# Patient Record
Sex: Female | Born: 1960 | Race: White | Hispanic: No | Smoking: Former smoker
Health system: Southern US, Community
[De-identification: ages and names within clinical notes are randomized; demographics above are authoritative.]

## PROBLEM LIST (undated history)

## (undated) DIAGNOSIS — B029 Zoster without complications: Secondary | ICD-10-CM

## (undated) DIAGNOSIS — R011 Cardiac murmur, unspecified: Secondary | ICD-10-CM

## (undated) DIAGNOSIS — E222 Syndrome of inappropriate secretion of antidiuretic hormone: Secondary | ICD-10-CM

## (undated) DIAGNOSIS — K219 Gastro-esophageal reflux disease without esophagitis: Secondary | ICD-10-CM

## (undated) DIAGNOSIS — H269 Unspecified cataract: Secondary | ICD-10-CM

## (undated) DIAGNOSIS — R519 Headache, unspecified: Secondary | ICD-10-CM

## (undated) DIAGNOSIS — F419 Anxiety disorder, unspecified: Secondary | ICD-10-CM

## (undated) DIAGNOSIS — J449 Chronic obstructive pulmonary disease, unspecified: Secondary | ICD-10-CM

## (undated) DIAGNOSIS — J439 Emphysema, unspecified: Secondary | ICD-10-CM

## (undated) DIAGNOSIS — I5189 Other ill-defined heart diseases: Secondary | ICD-10-CM

## (undated) DIAGNOSIS — E785 Hyperlipidemia, unspecified: Secondary | ICD-10-CM

## (undated) DIAGNOSIS — R911 Solitary pulmonary nodule: Secondary | ICD-10-CM

## (undated) DIAGNOSIS — M1990 Unspecified osteoarthritis, unspecified site: Secondary | ICD-10-CM

## (undated) DIAGNOSIS — I351 Nonrheumatic aortic (valve) insufficiency: Secondary | ICD-10-CM

## (undated) DIAGNOSIS — I10 Essential (primary) hypertension: Secondary | ICD-10-CM

## (undated) DIAGNOSIS — I739 Peripheral vascular disease, unspecified: Secondary | ICD-10-CM

## (undated) DIAGNOSIS — I2510 Atherosclerotic heart disease of native coronary artery without angina pectoris: Secondary | ICD-10-CM

## (undated) DIAGNOSIS — S32000A Wedge compression fracture of unspecified lumbar vertebra, initial encounter for closed fracture: Secondary | ICD-10-CM

## (undated) DIAGNOSIS — F32A Depression, unspecified: Secondary | ICD-10-CM

## (undated) DIAGNOSIS — I779 Disorder of arteries and arterioles, unspecified: Secondary | ICD-10-CM

## (undated) DIAGNOSIS — Z7982 Long term (current) use of aspirin: Secondary | ICD-10-CM

## (undated) DIAGNOSIS — R0600 Dyspnea, unspecified: Secondary | ICD-10-CM

## (undated) DIAGNOSIS — K8010 Calculus of gallbladder with chronic cholecystitis without obstruction: Secondary | ICD-10-CM

## (undated) DIAGNOSIS — I350 Nonrheumatic aortic (valve) stenosis: Secondary | ICD-10-CM

## (undated) DIAGNOSIS — T7840XA Allergy, unspecified, initial encounter: Secondary | ICD-10-CM

## (undated) DIAGNOSIS — Z6835 Body mass index (BMI) 35.0-35.9, adult: Secondary | ICD-10-CM

## (undated) DIAGNOSIS — E871 Hypo-osmolality and hyponatremia: Secondary | ICD-10-CM

## (undated) DIAGNOSIS — J189 Pneumonia, unspecified organism: Secondary | ICD-10-CM

## (undated) DIAGNOSIS — B019 Varicella without complication: Secondary | ICD-10-CM

## (undated) HISTORY — DX: Unspecified cataract: H26.9

## (undated) HISTORY — DX: Varicella without complication: B01.9

## (undated) HISTORY — DX: Allergy, unspecified, initial encounter: T78.40XA

## (undated) HISTORY — DX: Emphysema, unspecified: J43.9

## (undated) HISTORY — PX: EYE SURGERY: SHX253

## (undated) HISTORY — PX: TUBAL LIGATION: SHX77

## (undated) HISTORY — DX: Cardiac murmur, unspecified: R01.1

---

## 1995-08-07 HISTORY — PX: TUBAL LIGATION: SHX77

## 1998-02-14 ENCOUNTER — Ambulatory Visit (HOSPITAL_COMMUNITY): Admission: RE | Admit: 1998-02-14 | Discharge: 1998-02-14 | Payer: Self-pay | Admitting: Obstetrics and Gynecology

## 1998-03-11 ENCOUNTER — Other Ambulatory Visit: Admission: RE | Admit: 1998-03-11 | Discharge: 1998-03-11 | Payer: Self-pay | Admitting: Obstetrics and Gynecology

## 1999-03-08 ENCOUNTER — Other Ambulatory Visit: Admission: RE | Admit: 1999-03-08 | Discharge: 1999-03-08 | Payer: Self-pay | Admitting: Obstetrics and Gynecology

## 1999-11-21 ENCOUNTER — Encounter: Payer: Self-pay | Admitting: Family Medicine

## 1999-11-21 ENCOUNTER — Ambulatory Visit (HOSPITAL_COMMUNITY): Admission: RE | Admit: 1999-11-21 | Discharge: 1999-11-21 | Payer: Self-pay | Admitting: Family Medicine

## 2000-09-03 ENCOUNTER — Other Ambulatory Visit: Admission: RE | Admit: 2000-09-03 | Discharge: 2000-09-03 | Payer: Self-pay | Admitting: Obstetrics and Gynecology

## 2000-09-12 ENCOUNTER — Encounter: Payer: Self-pay | Admitting: Obstetrics and Gynecology

## 2000-09-12 ENCOUNTER — Ambulatory Visit (HOSPITAL_COMMUNITY): Admission: RE | Admit: 2000-09-12 | Discharge: 2000-09-12 | Payer: Self-pay | Admitting: Obstetrics and Gynecology

## 2000-11-27 ENCOUNTER — Encounter: Payer: Self-pay | Admitting: Family Medicine

## 2000-11-27 ENCOUNTER — Ambulatory Visit (HOSPITAL_COMMUNITY): Admission: RE | Admit: 2000-11-27 | Discharge: 2000-11-27 | Payer: Self-pay | Admitting: Family Medicine

## 2002-01-13 ENCOUNTER — Other Ambulatory Visit: Admission: RE | Admit: 2002-01-13 | Discharge: 2002-01-13 | Payer: Self-pay | Admitting: Obstetrics and Gynecology

## 2004-03-30 ENCOUNTER — Other Ambulatory Visit: Admission: RE | Admit: 2004-03-30 | Discharge: 2004-03-30 | Payer: Self-pay | Admitting: Obstetrics and Gynecology

## 2012-03-29 ENCOUNTER — Encounter (HOSPITAL_COMMUNITY): Payer: Self-pay | Admitting: *Deleted

## 2012-03-29 ENCOUNTER — Inpatient Hospital Stay (HOSPITAL_COMMUNITY)
Admission: EM | Admit: 2012-03-29 | Discharge: 2012-03-31 | DRG: 419 | Disposition: A | Discharge status: Home / Self Care | Payer: Medicaid Other | Attending: General Surgery | Admitting: General Surgery

## 2012-03-29 ENCOUNTER — Emergency Department (HOSPITAL_COMMUNITY): Payer: Medicaid Other

## 2012-03-29 DIAGNOSIS — K8020 Calculus of gallbladder without cholecystitis without obstruction: Secondary | ICD-10-CM

## 2012-03-29 DIAGNOSIS — Z6835 Body mass index (BMI) 35.0-35.9, adult: Secondary | ICD-10-CM

## 2012-03-29 DIAGNOSIS — F17200 Nicotine dependence, unspecified, uncomplicated: Secondary | ICD-10-CM | POA: Diagnosis present

## 2012-03-29 DIAGNOSIS — K8010 Calculus of gallbladder with chronic cholecystitis without obstruction: Secondary | ICD-10-CM | POA: Diagnosis present

## 2012-03-29 DIAGNOSIS — E669 Obesity, unspecified: Secondary | ICD-10-CM | POA: Diagnosis present

## 2012-03-29 DIAGNOSIS — I10 Essential (primary) hypertension: Secondary | ICD-10-CM | POA: Diagnosis present

## 2012-03-29 DIAGNOSIS — Z79899 Other long term (current) drug therapy: Secondary | ICD-10-CM

## 2012-03-29 HISTORY — DX: Essential (primary) hypertension: I10

## 2012-03-29 HISTORY — DX: Calculus of gallbladder with chronic cholecystitis without obstruction: K80.10

## 2012-03-29 HISTORY — DX: Body mass index (BMI) 35.0-35.9, adult: Z68.35

## 2012-03-29 HISTORY — DX: Zoster without complications: B02.9

## 2012-03-29 LAB — LIPASE, BLOOD: Lipase: 30 U/L (ref 11–59)

## 2012-03-29 LAB — CBC WITH DIFFERENTIAL/PLATELET
Eosinophils Absolute: 0.4 10*3/uL (ref 0.0–0.7)
Eosinophils Relative: 3 % (ref 0–5)
Hemoglobin: 11 g/dL — ABNORMAL LOW (ref 12.0–15.0)
Lymphs Abs: 4.1 10*3/uL — ABNORMAL HIGH (ref 0.7–4.0)
MCH: 22.7 pg — ABNORMAL LOW (ref 26.0–34.0)
MCV: 73.6 fL — ABNORMAL LOW (ref 78.0–100.0)
Monocytes Absolute: 1 10*3/uL (ref 0.1–1.0)
Monocytes Relative: 7 % (ref 3–12)
RBC: 4.85 MIL/uL (ref 3.87–5.11)

## 2012-03-29 LAB — COMPREHENSIVE METABOLIC PANEL
Alkaline Phosphatase: 101 U/L (ref 39–117)
BUN: 13 mg/dL (ref 6–23)
Calcium: 9.8 mg/dL (ref 8.4–10.5)
Creatinine, Ser: 0.87 mg/dL (ref 0.50–1.10)
GFR calc Af Amer: 89 mL/min — ABNORMAL LOW (ref >90)
Glucose, Bld: 102 mg/dL — ABNORMAL HIGH (ref 70–99)
Total Protein: 7.8 g/dL (ref 6.0–8.3)

## 2012-03-29 LAB — URINALYSIS, ROUTINE W REFLEX MICROSCOPIC
Bilirubin Urine: NEGATIVE
Nitrite: NEGATIVE
Specific Gravity, Urine: 1.025 (ref 1.005–1.030)
Urobilinogen, UA: 0.2 mg/dL (ref 0.0–1.0)

## 2012-03-29 MED ORDER — SODIUM CHLORIDE 0.9 % IV BOLUS (SEPSIS)
1000.0000 mL | Freq: Once | INTRAVENOUS | Status: AC
  Administered 2012-03-29: 1000 mL via INTRAVENOUS

## 2012-03-29 MED ORDER — MORPHINE SULFATE 4 MG/ML IJ SOLN
4.0000 mg | Freq: Once | INTRAMUSCULAR | Status: AC
  Administered 2012-03-29: 4 mg via INTRAVENOUS
  Filled 2012-03-29: qty 1

## 2012-03-29 MED ORDER — ONDANSETRON HCL 4 MG/2ML IJ SOLN
4.0000 mg | Freq: Once | INTRAMUSCULAR | Status: AC
  Administered 2012-03-29: 4 mg via INTRAVENOUS
  Filled 2012-03-29: qty 2

## 2012-03-29 NOTE — ED Notes (Signed)
Pt states x 3 weeks ago she started experiencing abdominal pain. Pt was treated for gastritis at this time. Pt states she developed a shingles rash by the end of that week and she was treated for that at that time. Pt states the following week, she developed abdominal pain again that has progressively worsened. Shingles are now resolved.

## 2012-03-30 ENCOUNTER — Inpatient Hospital Stay (HOSPITAL_COMMUNITY): Payer: Medicaid Other | Admitting: Anesthesiology

## 2012-03-30 ENCOUNTER — Encounter (HOSPITAL_COMMUNITY): Admission: EM | Disposition: A | Discharge status: Home / Self Care | Payer: Self-pay | Source: Home / Self Care

## 2012-03-30 ENCOUNTER — Emergency Department (HOSPITAL_COMMUNITY): Payer: Medicaid Other

## 2012-03-30 ENCOUNTER — Inpatient Hospital Stay (HOSPITAL_COMMUNITY): Payer: Medicaid Other

## 2012-03-30 ENCOUNTER — Encounter (HOSPITAL_COMMUNITY): Payer: Self-pay | Admitting: Anesthesiology

## 2012-03-30 DIAGNOSIS — K8066 Calculus of gallbladder and bile duct with acute and chronic cholecystitis without obstruction: Secondary | ICD-10-CM

## 2012-03-30 HISTORY — PX: CHOLECYSTECTOMY: SHX55

## 2012-03-30 LAB — SURGICAL PCR SCREEN: Staphylococcus aureus: POSITIVE — AB

## 2012-03-30 SURGERY — LAPAROSCOPIC CHOLECYSTECTOMY WITH INTRAOPERATIVE CHOLANGIOGRAM
Anesthesia: General | Site: Abdomen | Wound class: Clean Contaminated

## 2012-03-30 MED ORDER — BUPIVACAINE-EPINEPHRINE 0.25% -1:200000 IJ SOLN
INTRAMUSCULAR | Status: DC | PRN
  Administered 2012-03-30: 17 mL

## 2012-03-30 MED ORDER — KCL IN DEXTROSE-NACL 20-5-0.9 MEQ/L-%-% IV SOLN
INTRAVENOUS | Status: AC
  Filled 2012-03-30: qty 1000

## 2012-03-30 MED ORDER — EPHEDRINE SULFATE 50 MG/ML IJ SOLN
INTRAMUSCULAR | Status: DC | PRN
  Administered 2012-03-30 (×2): 10 mg via INTRAVENOUS

## 2012-03-30 MED ORDER — PANTOPRAZOLE SODIUM 40 MG IV SOLR
40.0000 mg | Freq: Every day | INTRAVENOUS | Status: DC
  Administered 2012-03-30: 40 mg via INTRAVENOUS
  Filled 2012-03-30 (×2): qty 40

## 2012-03-30 MED ORDER — SODIUM CHLORIDE 0.9 % IV SOLN
1.0000 g | INTRAVENOUS | Status: AC
  Administered 2012-03-31: 1 g via INTRAVENOUS
  Filled 2012-03-30: qty 1

## 2012-03-30 MED ORDER — FENTANYL CITRATE 0.05 MG/ML IJ SOLN
INTRAMUSCULAR | Status: DC | PRN
  Administered 2012-03-30: 100 ug via INTRAVENOUS
  Administered 2012-03-30 (×3): 50 ug via INTRAVENOUS
  Administered 2012-03-30: 100 ug via INTRAVENOUS

## 2012-03-30 MED ORDER — MORPHINE SULFATE 4 MG/ML IJ SOLN
4.0000 mg | Freq: Once | INTRAMUSCULAR | Status: AC
  Administered 2012-03-30: 4 mg via INTRAVENOUS
  Filled 2012-03-30: qty 1

## 2012-03-30 MED ORDER — IOHEXOL 300 MG/ML  SOLN
100.0000 mL | Freq: Once | INTRAMUSCULAR | Status: AC | PRN
  Administered 2012-03-30: 100 mL via INTRAVENOUS

## 2012-03-30 MED ORDER — LIDOCAINE HCL (CARDIAC) 20 MG/ML IV SOLN
INTRAVENOUS | Status: DC | PRN
  Administered 2012-03-30: 30 mg via INTRAVENOUS

## 2012-03-30 MED ORDER — ACETAMINOPHEN 10 MG/ML IV SOLN
INTRAVENOUS | Status: AC
  Filled 2012-03-30: qty 100

## 2012-03-30 MED ORDER — ONDANSETRON HCL 4 MG/2ML IJ SOLN: 4.0000 mg | Freq: Four times a day (QID) | INTRAMUSCULAR | Status: DC | PRN

## 2012-03-30 MED ORDER — SODIUM CHLORIDE 0.9 % IV SOLN
1.0000 g | INTRAVENOUS | Status: DC
  Administered 2012-03-30: 1 g via INTRAVENOUS
  Filled 2012-03-30 (×2): qty 1

## 2012-03-30 MED ORDER — CHLORHEXIDINE GLUCONATE CLOTH 2 % EX PADS: 6.0000 | MEDICATED_PAD | Freq: Every day | CUTANEOUS | Status: DC

## 2012-03-30 MED ORDER — MIDAZOLAM HCL 5 MG/5ML IJ SOLN
INTRAMUSCULAR | Status: DC | PRN
  Administered 2012-03-30: 1 mg via INTRAVENOUS

## 2012-03-30 MED ORDER — SUCCINYLCHOLINE CHLORIDE 20 MG/ML IJ SOLN
INTRAMUSCULAR | Status: DC | PRN
  Administered 2012-03-30: 150 mg via INTRAVENOUS

## 2012-03-30 MED ORDER — FENTANYL CITRATE 0.05 MG/ML IJ SOLN
25.0000 ug | INTRAMUSCULAR | Status: DC | PRN
  Administered 2012-03-30 (×3): 50 ug via INTRAVENOUS

## 2012-03-30 MED ORDER — PROPOFOL 10 MG/ML IV EMUL
INTRAVENOUS | Status: DC | PRN
  Administered 2012-03-30: 200 mg via INTRAVENOUS

## 2012-03-30 MED ORDER — FENTANYL CITRATE 0.05 MG/ML IJ SOLN
INTRAMUSCULAR | Status: AC
  Filled 2012-03-30: qty 2

## 2012-03-30 MED ORDER — LACTATED RINGERS IV SOLN
INTRAVENOUS | Status: DC | PRN
  Administered 2012-03-30 (×2): via INTRAVENOUS

## 2012-03-30 MED ORDER — BUPIVACAINE-EPINEPHRINE PF 0.25-1:200000 % IJ SOLN
INTRAMUSCULAR | Status: AC
  Filled 2012-03-30: qty 30

## 2012-03-30 MED ORDER — OXYCODONE-ACETAMINOPHEN 5-325 MG PO TABS: 1.0000 | ORAL_TABLET | ORAL | Status: DC | PRN

## 2012-03-30 MED ORDER — LACTATED RINGERS IR SOLN
Status: DC | PRN
  Administered 2012-03-30: 1000 mL

## 2012-03-30 MED ORDER — ONDANSETRON HCL 4 MG/2ML IJ SOLN
INTRAMUSCULAR | Status: DC | PRN
  Administered 2012-03-30: 2 mg via INTRAVENOUS

## 2012-03-30 MED ORDER — SODIUM CHLORIDE 0.9 % IV BOLUS (SEPSIS)
1000.0000 mL | Freq: Once | INTRAVENOUS | Status: AC
  Administered 2012-03-30: 1000 mL via INTRAVENOUS

## 2012-03-30 MED ORDER — KCL IN DEXTROSE-NACL 20-5-0.9 MEQ/L-%-% IV SOLN
INTRAVENOUS | Status: DC
  Administered 2012-03-30 – 2012-03-31 (×2): via INTRAVENOUS
  Filled 2012-03-30 (×5): qty 1000

## 2012-03-30 MED ORDER — AMLODIPINE BESYLATE 10 MG PO TABS
10.0000 mg | ORAL_TABLET | Freq: Every day | ORAL | Status: DC
  Administered 2012-03-30 – 2012-03-31 (×2): 10 mg via ORAL
  Filled 2012-03-30 (×2): qty 1

## 2012-03-30 MED ORDER — IOHEXOL 300 MG/ML  SOLN
INTRAMUSCULAR | Status: DC | PRN
  Administered 2012-03-30: 14 mL via INTRAVENOUS

## 2012-03-30 MED ORDER — MORPHINE SULFATE 2 MG/ML IJ SOLN
2.0000 mg | INTRAMUSCULAR | Status: DC | PRN
  Administered 2012-03-30: 2 mg via INTRAVENOUS
  Administered 2012-03-30: 4 mg via INTRAVENOUS
  Administered 2012-03-30 (×4): 2 mg via INTRAVENOUS
  Filled 2012-03-30 (×4): qty 1
  Filled 2012-03-30 (×2): qty 2
  Filled 2012-03-30: qty 1

## 2012-03-30 MED ORDER — CHLORHEXIDINE GLUCONATE CLOTH 2 % EX PADS: 6.0000 | MEDICATED_PAD | Freq: Once | CUTANEOUS | Status: DC

## 2012-03-30 MED ORDER — PROMETHAZINE HCL 25 MG/ML IJ SOLN: 6.2500 mg | INTRAMUSCULAR | Status: DC | PRN

## 2012-03-30 MED ORDER — ONDANSETRON HCL 4 MG/2ML IJ SOLN
4.0000 mg | Freq: Once | INTRAMUSCULAR | Status: AC
  Administered 2012-03-30: 4 mg via INTRAVENOUS
  Filled 2012-03-30: qty 2

## 2012-03-30 MED ORDER — ACETAMINOPHEN 10 MG/ML IV SOLN
INTRAVENOUS | Status: DC | PRN
  Administered 2012-03-30: 1000 mg via INTRAVENOUS

## 2012-03-30 MED ORDER — IOHEXOL 300 MG/ML  SOLN
INTRAMUSCULAR | Status: AC
  Filled 2012-03-30: qty 1

## 2012-03-30 MED ORDER — LISINOPRIL 20 MG PO TABS
20.0000 mg | ORAL_TABLET | Freq: Every day | ORAL | Status: DC
  Administered 2012-03-30 – 2012-03-31 (×2): 20 mg via ORAL
  Filled 2012-03-30 (×2): qty 1

## 2012-03-30 MED ORDER — CISATRACURIUM BESYLATE (PF) 10 MG/5ML IV SOLN
INTRAVENOUS | Status: DC | PRN
  Administered 2012-03-30: 3 mg via INTRAVENOUS
  Administered 2012-03-30: 7 mg via INTRAVENOUS

## 2012-03-30 SURGICAL SUPPLY — 41 items
APPLIER CLIP ROT 10 11.4 M/L (STAPLE) ×2
BENZOIN TINCTURE PRP APPL 2/3 (GAUZE/BANDAGES/DRESSINGS) IMPLANT
CANISTER SUCTION 2500CC (MISCELLANEOUS) ×2 IMPLANT
CATH REDDICK CHOLANGI 4FR 50CM (CATHETERS) IMPLANT
CLIP APPLIE ROT 10 11.4 M/L (STAPLE) ×1 IMPLANT
CLOTH BEACON ORANGE TIMEOUT ST (SAFETY) ×2 IMPLANT
COVER MAYO STAND STRL (DRAPES) ×2 IMPLANT
DECANTER SPIKE VIAL GLASS SM (MISCELLANEOUS) ×2 IMPLANT
DERMABOND ADVANCED (GAUZE/BANDAGES/DRESSINGS) ×2
DERMABOND ADVANCED .7 DNX12 (GAUZE/BANDAGES/DRESSINGS) ×2 IMPLANT
DRAPE C-ARM 42X72 X-RAY (DRAPES) ×2 IMPLANT
DRAPE LAPAROSCOPIC ABDOMINAL (DRAPES) ×2 IMPLANT
ELECT REM PT RETURN 9FT ADLT (ELECTROSURGICAL) ×2
ELECTRODE REM PT RTRN 9FT ADLT (ELECTROSURGICAL) ×1 IMPLANT
GLOVE BIOGEL PI IND STRL 7.0 (GLOVE) ×1 IMPLANT
GLOVE BIOGEL PI INDICATOR 7.0 (GLOVE) ×1
GLOVE SS BIOGEL STRL SZ 7.5 (GLOVE) ×1 IMPLANT
GLOVE SUPERSENSE BIOGEL SZ 7.5 (GLOVE) ×1
GOWN STRL NON-REIN LRG LVL3 (GOWN DISPOSABLE) ×2 IMPLANT
GOWN STRL REIN XL XLG (GOWN DISPOSABLE) ×4 IMPLANT
HEMOSTAT SURGICEL 4X8 (HEMOSTASIS) IMPLANT
IV CATH 14GX2 1/4 (CATHETERS) IMPLANT
IV SET EXT 30 76VOL 4 MALE LL (IV SETS) IMPLANT
KIT BASIN OR (CUSTOM PROCEDURE TRAY) ×2 IMPLANT
NS IRRIG 1000ML POUR BTL (IV SOLUTION) ×2 IMPLANT
POUCH SPECIMEN RETRIEVAL 10MM (ENDOMECHANICALS) ×2 IMPLANT
SCISSORS LAP 5X35 DISP (ENDOMECHANICALS) ×2 IMPLANT
SET CHOLANGIOGRAPH MIX (MISCELLANEOUS) ×2 IMPLANT
SET IRRIG TUBING LAPAROSCOPIC (IRRIGATION / IRRIGATOR) ×2 IMPLANT
SLEEVE Z-THREAD 5X100MM (TROCAR) ×2 IMPLANT
SOLUTION ANTI FOG 6CC (MISCELLANEOUS) ×2 IMPLANT
STOPCOCK K 69 2C6206 (IV SETS) IMPLANT
STRIP CLOSURE SKIN 1/2X4 (GAUZE/BANDAGES/DRESSINGS) IMPLANT
SUT MNCRL AB 4-0 PS2 18 (SUTURE) ×4 IMPLANT
TOWEL OR 17X26 10 PK STRL BLUE (TOWEL DISPOSABLE) ×2 IMPLANT
TOWEL OR NON WOVEN STRL DISP B (DISPOSABLE) ×2 IMPLANT
TRAY LAP CHOLE (CUSTOM PROCEDURE TRAY) ×2 IMPLANT
TROCAR HASSON GELL 12X100 (TROCAR) ×2 IMPLANT
TROCAR Z-THREAD FIOS 11X100 BL (TROCAR) ×2 IMPLANT
TROCAR Z-THREAD FIOS 5X100MM (TROCAR) ×2 IMPLANT
TUBING INSUFFLATION 10FT LAP (TUBING) ×2 IMPLANT

## 2012-03-30 NOTE — ED Provider Notes (Signed)
History     CSN: [id number]  Arrival date & time 03/29/12  1942   First MD Initiated Contact with Patient 03/29/12 2203      Chief Complaint  Patient presents with  . Abdominal Pain    (Consider location/radiation/quality/duration/timing/severity/associated sxs/prior treatment) HPI  Patient presents to the emergency department with complaints of abdominal pain. She was seen by her primary care doctor and was diagnosed with gastritis and shingles she was treated for that and the shingles have resolved. She states that she still continues to have abdominal pain that has been progressively getting worse. She's come to the hospital tonight because she just can't take it anymore and she's concerned about what's going on with her. She states that she has not had an appetite and has not been in a couple of days. The patient denies the pain being worse after eating. She states that she does not have her gallbladder, and has never been told she has problems with her liver or her pancreas. The patient is in no acute distress at this time however she does appear to be very uncomfortable. She denies vomiting and diarrhea but does admit to having nausea.  Past Medical History  Diagnosis Date  . Shingles   . Hypertension     Past Surgical History  Procedure Date  . Tubal ligation     History reviewed. No pertinent family history.  History  Substance Use Topics  . Smoking status: Current Everyday Smoker    Types: Cigarettes  . Smokeless tobacco: Not on file  . Alcohol Use: No    OB History    Grav Para Term Preterm Abortions TAB SAB Ect Mult Living                  Review of Systems  All other systems reviewed and are negative.    Allergies  Codeine  Home Medications   Current Outpatient Rx  Name Route Sig Dispense Refill  . ACETAMINOPHEN 325 MG PO TABS Oral Take 650 mg by mouth every 6 (six) hours as needed. Pain    . AMLODIPINE BESYLATE 10 MG PO TABS Oral Take 10 mg by  mouth daily.    . BUPROPION HCL ER (XL) 300 MG PO TB24 Oral Take 300 mg by mouth daily.    Marland Kitchen LISINOPRIL 20 MG PO TABS Oral Take 20 mg by mouth daily.    Marland Kitchen RANITIDINE HCL 150 MG PO TABS Oral Take 150 mg by mouth 2 (two) times daily.    Marland Kitchen SIMETHICONE 80 MG PO CHEW Oral Chew 80 mg by mouth every 6 (six) hours as needed. Flatulence      BP 155/91  Pulse 64  Temp 99.3 F (37.4 C) (Oral)  Resp 18  Ht 5\' 3"  (1.6 m)  Wt 206 lb (93.441 kg)  BMI 36.49 kg/m2  SpO2 99%  Physical Exam  Nursing note and vitals reviewed. Constitutional: She appears well-developed and well-nourished. No distress.  HENT:  Head: Normocephalic and atraumatic.  Eyes: Pupils are equal, round, and reactive to light.  Neck: Normal range of motion. Neck supple.  Cardiovascular: Normal rate and regular rhythm.   Pulmonary/Chest: Effort normal.  Abdominal: Soft. Bowel sounds are normal. She exhibits no distension and no mass. There is tenderness (RUQ). There is no rebound and no guarding.  Neurological: She is alert.  Skin: Skin is warm and dry.    ED Course  Procedures (including critical care time)  Labs Reviewed  CBC WITH DIFFERENTIAL -  Abnormal; Notable for the following:    WBC 14.1 (*)     Hemoglobin 11.0 (*)     HCT 35.7 (*)     MCV 73.6 (*)     MCH 22.7 (*)     RDW 16.6 (*)     Platelets 592 (*)     Neutro Abs 8.4 (*)     Lymphs Abs 4.1 (*)     All other components within normal limits  COMPREHENSIVE METABOLIC PANEL - Abnormal; Notable for the following:    Glucose, Bld 102 (*)     Total Bilirubin 0.1 (*)     GFR calc non Af Amer 76 (*)     GFR calc Af Amer 89 (*)     All other components within normal limits  LIPASE, BLOOD  URINALYSIS, ROUTINE W REFLEX MICROSCOPIC   Ct Abdomen Pelvis W Contrast  03/30/2012  *RADIOLOGY REPORT*  Clinical Data: Abdominal pain  CT ABDOMEN AND PELVIS WITH CONTRAST  Technique:  Multidetector CT imaging of the abdomen and pelvis was performed following the standard  protocol during bolus administration of intravenous contrast.  Contrast: OMNIPAQUE IOHEXOL 300 MG/ML  SOLN  Comparison: None.  Findings: Aortic valve calcification.  Coronary artery calcification.  The lung bases are clear.  Homogeneous hepatic enhancement.  Nonspecific hypodensity within the spleen. Gallstones.  No pericholecystic fluid or gallbladder wall thickening.  Minimal intra and extrahepatic biliary ductal prominence to the level of the ampulla.  No pancreatic ductal dilatation 6 mm hypodensity within the uncinate process.  Otherwise homogeneous pancreatic enhancement.  Unremarkable adrenal glands.  Subcentimeter hypodensity left kidney.  Otherwise symmetric renal enhancement.  No hydronephrosis or hydroureter.  The no bowel obstruction.  Normal appendix.  No CT evidence for colitis.  No free intraperitoneal air or fluid.  No lymphadenopathy.  Thin-walled partially decompressed bladder.  Unremarkable CT appearance to the uterus and adnexa.  There is scattered atherosclerotic calcification of the aorta and its branches. No aneurysmal dilatation.  Mild multilevel degenerative changes.  No acute osseous finding.  IMPRESSION: Cholelithiasis.  No CT evidence for cholecystitis.  Mild intra and extrahepatic biliary ductal prominence to the level of the ampulla.  No obstructing lesion identified.  Correlate with LFTs and ERCP or MRCP if clinically warranted.   Original Report Authenticated By: Waneta Martins, M.D.      1. Cholelithiasis       MDM   CT of the abdomen done originally because patient and myself did not think she still has her gallbladder. CT scan shows that she does indeed have a gallbladder and that she has gallstones. I have consulted with surgery as the patient's pain has been difficult to control in the emergency department, and she has a white count of 14, the general surgeon has agreed to come to the emergency department and admit the patient.  Patient will be admitted to  Gen. surgery. I have discussed the results of the plan with the patient who is agreeable and understandable.       Dorthula Matas, PA 03/30/12 [phone]

## 2012-03-30 NOTE — Op Note (Signed)
Preoperative diagnosis: Cholelithiasis and cholecystitis  Postoperative diagnosis: Cholelithiasis and cholecystitis  Surgical procedure: Laparoscopic cholecystectomy with intraoperative cholangiogram  Surgeon: Sharlet Salina T. Donye Campanelli M.D.  Assistant: Lodema Pilot D.O.  Anesthesia: General Endotracheal  Complications: None  Estimated blood loss: Minimal  Description of procedure: The patient brought to the operating room, placed in the supine position on the operating table, and general endotracheal anesthesia induced. The abdomen was widely sterilely prepped and draped. The patient had received preoperative IV antibiotics and PAS were in place. Patient timeout was performed the correct procedure verified. Standard 4 port technique was used with an open Hassan cannula at the umbilicus and the remainder of the ports placed under direct vision. The gallbladder was visualized. It appeared tense and acutely inflamed. The GB was decompressed with an aspiration needle . The fundus was grasped and elevated up over the liver and the infundibulum retracted inferiolaterally. Peritoneum anterior and posterior to close triangle was incised and fibrofatty tissue stripped off the neck of the gallbladder toward the porta hepatis. The distal gallbladder was thoroughly dissected. The cystic artery was identified in close triangle and the cystic duct gallbladder junction dissected 360.  A good critical view was obtained. When the anatomy was clear the cystic duct was clipped at the gallbladder junction and an operative cholangiogram obtained through the cystic duct. This showed good filling of a normal common bile duct and intrahepatic ducts with free flow into the duodenum and no filling defects. Following this the Cholangiocath was removed and the cystic duct was doubly clipped proximally and divided. The cystic artery was doubly clipped proximally and distally and divided. The gallbladder was dissected free from its  bed using hook cautery and removed through the umbilical port site. Complete hemostasis was obtained in the gallbladder bed. The right upper quadrant was thoroughly irrigated and hemostasis assured. Trochars were removed and all CO2 evacuated and the [facility] trocar site fascial defect closed. Skin incisions were closed with subcuticular Monocryl and Dermabond. Sponge needle and instrument counts were correct. The patient was taken to PACU in good condition.  Suhailah Kwan T  03/30/2012

## 2012-03-30 NOTE — ED Notes (Signed)
Ct notified pt completed CM 

## 2012-03-30 NOTE — H&P (Signed)
Denise [name] is an 51 y.o. female.   Chief Complaint: abdominal pain HPI: patient presents to the emergency room with persistent abdominal pain. She states that she has had similar pain in her epigastrium and right upper quadrant some months ago that resolved. Then starting 5 days ago she has been having waxing and waning pain in her epigastrium and right upper quadrant radiating to her flank. This occasionally gets very severe. It has been daily and became quite bad today and she presented to the emergency room. She has received several doses of pain medication in the emergency room and the pain will then recur. She has had no nausea or vomiting. She is felt chilled but no fevers. No jaundice. She's been somewhat constipated during this episode. The pain is somewhat positional.  Past Medical History  Diagnosis Date  . Shingles   . Hypertension     Past Surgical History  Procedure Date  . Tubal ligation     History reviewed. No pertinent family history. Social History:  reports that she has been smoking Cigarettes.  She does not have any smokeless tobacco history on file. She reports that she does not drink alcohol or use illicit drugs.  Allergies:  Allergies  Allergen Reactions  . Codeine Itching                                                                              Current Outpatient Prescriptions  Medication Sig Dispense Refill  . acetaminophen (TYLENOL) 325 MG tablet Take 650 mg by mouth every 6 (six) hours as needed. Pain      . amLODipine (NORVASC) 10 MG tablet Take 10 mg by mouth daily.      Marland Kitchen buPROPion (WELLBUTRIN XL) 300 MG 24 hr tablet Take 300 mg by mouth daily.      Marland Kitchen lisinopril (PRINIVIL,ZESTRIL) 20 MG tablet Take 20 mg by mouth daily.      . ranitidine (ZANTAC) 150 MG tablet Take 150 mg by mouth 2 (two) times daily.      . simethicone (GAS-X) 80 MG chewable tablet Chew 80 mg by mouth every 6 (six) hours as needed. Flatulence         Results for  orders placed during the hospital encounter of 03/29/12 (from the past 48 hour(s))  URINALYSIS, ROUTINE W REFLEX MICROSCOPIC     Status: Normal   Collection Time   03/29/12  8:03 PM      Component Value Range Comment   Color, Urine YELLOW  YELLOW    APPearance CLEAR  CLEAR    Specific Gravity, Urine 1.025  1.005 - 1.030    pH 5.5  5.0 - 8.0    Glucose, UA NEGATIVE  NEGATIVE mg/dL    Hgb urine dipstick NEGATIVE  NEGATIVE    Bilirubin Urine NEGATIVE  NEGATIVE    Ketones, ur NEGATIVE  NEGATIVE mg/dL    Protein, ur NEGATIVE  NEGATIVE mg/dL    Urobilinogen, UA 0.2  0.0 - 1.0 mg/dL    Nitrite NEGATIVE  NEGATIVE    Leukocytes, UA NEGATIVE  NEGATIVE MICROSCOPIC NOT DONE ON URINES WITH NEGATIVE PROTEIN, BLOOD, LEUKOCYTES, NITRITE, OR GLUCOSE <1000 mg/dL.  CBC WITH DIFFERENTIAL  Status: Abnormal   Collection Time   03/29/12  8:47 PM      Component Value Range Comment   WBC 14.1 (*) 4.0 - 10.5 K/uL    RBC 4.85  3.87 - 5.11 MIL/uL    Hemoglobin 11.0 (*) 12.0 - 15.0 g/dL    HCT 40.9 (*) 81.1 - 46.0 %    MCV 73.6 (*) 78.0 - 100.0 fL    MCH 22.7 (*) 26.0 - 34.0 pg    MCHC 30.8  30.0 - 36.0 g/dL    RDW 91.4 (*) 78.2 - 15.5 %    Platelets 592 (*) 150 - 400 K/uL    Neutrophils Relative 60  43 - 77 %    Neutro Abs 8.4 (*) 1.7 - 7.7 K/uL    Lymphocytes Relative 29  12 - 46 %    Lymphs Abs 4.1 (*) 0.7 - 4.0 K/uL    Monocytes Relative 7  3 - 12 %    Monocytes Absolute 1.0  0.1 - 1.0 K/uL    Eosinophils Relative 3  0 - 5 %    Eosinophils Absolute 0.4  0.0 - 0.7 K/uL    Basophils Relative 1  0 - 1 %    Basophils Absolute 0.1  0.0 - 0.1 K/uL   COMPREHENSIVE METABOLIC PANEL     Status: Abnormal   Collection Time   03/29/12  8:47 PM      Component Value Range Comment   Sodium 137  135 - 145 mEq/L    Potassium 4.4  3.5 - 5.1 mEq/L    Chloride 101  96 - 112 mEq/L    CO2 26  19 - 32 mEq/L    Glucose, Bld 102 (*) 70 - 99 mg/dL    BUN 13  6 - 23 mg/dL    Creatinine, Ser 9.56  0.50 - 1.10 mg/dL     Calcium 9.8  8.4 - 10.5 mg/dL    Total Protein 7.8  6.0 - 8.3 g/dL    Albumin 3.8  3.5 - 5.2 g/dL    AST 14  0 - 37 U/L    ALT 16  0 - 35 U/L    Alkaline Phosphatase 101  39 - 117 U/L    Total Bilirubin 0.1 (*) 0.3 - 1.2 mg/dL    GFR calc non Af Amer 76 (*) >90 mL/min    GFR calc Af Amer 89 (*) >90 mL/min   LIPASE, BLOOD     Status: Normal   Collection Time   03/29/12  8:47 PM      Component Value Range Comment   Lipase 30  11 - 59 U/L    Ct Abdomen Pelvis W Contrast  03/30/2012  *RADIOLOGY REPORT*  Clinical Data: Abdominal pain  CT ABDOMEN AND PELVIS WITH CONTRAST  Technique:  Multidetector CT imaging of the abdomen and pelvis was performed following the standard protocol during bolus administration of intravenous contrast.  Contrast: OMNIPAQUE IOHEXOL 300 MG/ML  SOLN  Comparison: None.  Findings: Aortic valve calcification.  Coronary artery calcification.  The lung bases are clear.  Homogeneous hepatic enhancement.  Nonspecific hypodensity within the spleen. Gallstones.  No pericholecystic fluid or gallbladder wall thickening.  Minimal intra and extrahepatic biliary ductal prominence to the level of the ampulla.  No pancreatic ductal dilatation 6 mm hypodensity within the uncinate process.  Otherwise homogeneous pancreatic enhancement.  Unremarkable adrenal glands.  Subcentimeter hypodensity left kidney.  Otherwise symmetric renal enhancement.  No hydronephrosis or hydroureter.  The no bowel obstruction.  Normal appendix.  No CT evidence for colitis.  No free intraperitoneal air or fluid.  No lymphadenopathy.  Thin-walled partially decompressed bladder.  Unremarkable CT appearance to the uterus and adnexa.  There is scattered atherosclerotic calcification of the aorta and its branches. No aneurysmal dilatation.  Mild multilevel degenerative changes.  No acute osseous finding.  IMPRESSION: Cholelithiasis.  No CT evidence for cholecystitis.  Mild intra and extrahepatic biliary ductal prominence  to the level of the ampulla.  No obstructing lesion identified.  Correlate with LFTs and ERCP or MRCP if clinically warranted.   Original Report Authenticated By: Waneta Martins, M.D.     Review of Systems  Constitutional: Positive for chills. Negative for fever.  HENT: Negative.   Eyes: Negative.   Respiratory: Negative.   Cardiovascular: Negative.   Gastrointestinal: Positive for abdominal pain and constipation. Negative for nausea, vomiting, diarrhea, blood in stool and melena.  Genitourinary: Negative.   Musculoskeletal: Negative.     Blood pressure 155/91, pulse 64, temperature 99.3 F (37.4 C), temperature source Oral, resp. rate 18, height 5\' 3"  (1.6 m), weight 206 lb (93.441 kg), SpO2 99.00%. Physical Exam  General: Moderately obese Caucasian female in no acute distress Skin: Warm and dry without rash or infection HEENT: Sclera nonicteric. Pupils equal reactive. No palpable masses or thyromegaly. Oropharynx clear. Lymph nodes: No cervical, supraclavicular, or axillary nodes palpable Lungs: Clear equal breath sounds bilaterally without wheezing or increased work of breathing Cardiovascular: Regular rate and rhythm. No murmur. No peripheral edema. Pulses intact. Abdomen: Healed small umbilical incision. No hernias. Nondistended. There is mild right upper quadrant tenderness without guarding. No masses or organomegaly. Extremities: No joint swelling or deformity Neurologic: She is alert and fully oriented. Affect normal. No motor deficits.  Assessment/Plan Persistent and worsening right upper quadrant abdominal pain consistent with persistent biliary colic or early cholecystitis. She has multiple gallstones on CT. White count is somewhat elevated. She will be admitted and treated with pain medication. We'll cover with IV antibiotics for possible cholecystitis. Plan urgent laparoscopic cholecystectomy perI discussed the procedure in detail.    We discussed the risks and benefits  of a laparoscopic cholecystectomy and possible cholangiogram including, but not limited to bleeding, infection, injury to surrounding structures such as the intestine or liver, bile leak, retained gallstones, need to convert to an open procedure, prolonged diarrhea, blood clots such as  DVT, common bile duct injury, anesthesia risks, and possible need for additional procedures.  The likelihood of improvement in symptoms and return to the patient's normal status is good. We discussed the typical post-operative recovery course.   Mahlani Berninger T 03/30/2012, 3:23 AM

## 2012-03-30 NOTE — Anesthesia Preprocedure Evaluation (Addendum)
Anesthesia Evaluation  Patient identified by MRN, date of birth, ID band Patient awake    Reviewed: Allergy & Precautions, H&P , NPO status , Patient's Chart, lab work & pertinent test results  Airway Mallampati: II TM Distance: >3 FB Neck ROM: Full    Dental No notable dental hx.    Pulmonary Current Smoker,  breath sounds clear to auscultation  Pulmonary exam normal       Cardiovascular hypertension, Pt. on medications Rhythm:Regular Rate:Normal     Neuro/Psych negative neurological ROS  negative psych ROS   GI/Hepatic negative GI ROS, Neg liver ROS,   Endo/Other  negative endocrine ROS  Renal/GU negative Renal ROS  negative genitourinary   Musculoskeletal negative musculoskeletal ROS (+)   Abdominal (+) + obese,   Peds negative pediatric ROS (+)  Hematology negative hematology ROS (+)   Anesthesia Other Findings   Reproductive/Obstetrics negative OB ROS                          Anesthesia Physical Anesthesia Plan  ASA: II  Anesthesia Plan: General   Post-op Pain Management:    Induction: Intravenous  Airway Management Planned: Oral ETT  Additional Equipment:   Intra-op Plan:   Post-operative Plan: Extubation in OR  Informed Consent: I have reviewed the patients History and Physical, chart, labs and discussed the procedure including the risks, benefits and alternatives for the proposed anesthesia with the patient or authorized representative who has indicated his/her understanding and acceptance.   Dental advisory given  Plan Discussed with: CRNA  Anesthesia Plan Comments:         Anesthesia Quick Evaluation

## 2012-03-30 NOTE — Transfer of Care (Signed)
Immediate Anesthesia Transfer of Care Note  Patient: Denise [name]  Procedure(s) Performed: Procedure(s) (LRB): LAPAROSCOPIC CHOLECYSTECTOMY WITH INTRAOPERATIVE CHOLANGIOGRAM (N/A)  Patient Location: PACU  Anesthesia Type: General  Level of Consciousness: awake, alert , oriented and patient cooperative  Airway & Oxygen Therapy: Patient Spontanous Breathing and Patient connected to face mask oxygen  Post-op Assessment: Report given to PACU RN  Post vital signs: Reviewed and stable  Complications: No apparent anesthesia complications

## 2012-03-30 NOTE — Anesthesia Postprocedure Evaluation (Signed)
  Anesthesia Post-op Note  Patient: Denise [name]  Procedure(s) Performed: Procedure(s) (LRB): LAPAROSCOPIC CHOLECYSTECTOMY WITH INTRAOPERATIVE CHOLANGIOGRAM (N/A)  Patient Location: PACU  Anesthesia Type: General  Level of Consciousness: awake and alert   Airway and Oxygen Therapy: Patient Spontanous Breathing  Post-op Pain: mild  Post-op Assessment: Post-op Vital signs reviewed, Patient's Cardiovascular Status Stable, Respiratory Function Stable, Patent Airway and No signs of Nausea or vomiting  Post-op Vital Signs: stable  Complications: No apparent anesthesia complications

## 2012-03-31 ENCOUNTER — Encounter (HOSPITAL_COMMUNITY): Payer: Self-pay | Admitting: General Surgery

## 2012-03-31 DIAGNOSIS — I10 Essential (primary) hypertension: Secondary | ICD-10-CM | POA: Diagnosis present

## 2012-03-31 DIAGNOSIS — Z6835 Body mass index (BMI) 35.0-35.9, adult: Secondary | ICD-10-CM

## 2012-03-31 DIAGNOSIS — K8010 Calculus of gallbladder with chronic cholecystitis without obstruction: Secondary | ICD-10-CM

## 2012-03-31 HISTORY — DX: Calculus of gallbladder with chronic cholecystitis without obstruction: K80.10

## 2012-03-31 HISTORY — DX: Essential (primary) hypertension: I10

## 2012-03-31 HISTORY — DX: Body mass index (BMI) 35.0-35.9, adult: Z68.35

## 2012-03-31 MED ORDER — OXYCODONE-ACETAMINOPHEN 5-325 MG PO TABS: 1.0000 | ORAL_TABLET | ORAL | Status: AC | PRN

## 2012-03-31 NOTE — Progress Notes (Signed)
Patient to be discharged, awaiting ride.  Discharge instructions and teaching given to patient with verbal feedback and understanding.

## 2012-03-31 NOTE — Discharge Summary (Signed)
Physician Discharge Summary  Patient ID: Denise [name] MRN: [id number] DOB/AGE: May 02, 1961 51 y.o.  Admit date: 03/29/2012 Discharge date: 03/31/2012  Admission Diagnoses: Cholelithiasis and cholecystitis  Hypertension  Hx. Shingles    Discharge Diagnoses: Same Principal Problem:  *Cholecystitis with cholelithiasis Active Problems:  Hypertension  BMI 35.0-35.9,adult   PROCEDURES: Laparoscopic cholecystectomy with intraoperative cholangiogram. 03/30/12 Dr. [facility] Course: patient presents to the emergency room with persistent abdominal pain. She states that she has had similar pain in her epigastrium and right upper quadrant some months ago that resolved. Then starting 5 days ago she has been having waxing and waning pain in her epigastrium and right upper quadrant radiating to her flank. This occasionally gets very severe. It has been daily and became quite bad today and she presented to the emergency room. She has received several doses of pain medication in the emergency room and the pain will then recur. She has had no nausea or vomiting. She is felt chilled but no fevers. No jaundice. She's been somewhat constipated during this episode. The pain is somewhat positional. Pt tolerated the procedure well her diet has been advanced, she is ambulating well and wants to go home.  Follow up in 2-3 weeks Dr. Johna Sheriff  Condition on D/C:  Improved.   Disposition: Final discharge disposition not confirmed   Medication List  As of 03/31/2012  2:49 PM   TAKE these medications         acetaminophen 325 MG tablet   Commonly known as: TYLENOL   Take 650 mg by mouth every 6 (six) hours as needed. Pain      amLODipine 10 MG tablet   Commonly known as: NORVASC   Take 10 mg by mouth daily.      buPROPion 300 MG 24 hr tablet   Commonly known as: WELLBUTRIN XL   Take 300 mg by mouth daily.      GAS-X 80 MG chewable tablet   Generic drug: simethicone   Chew 80 mg by mouth every  6 (six) hours as needed. Flatulence      lisinopril 20 MG tablet   Commonly known as: PRINIVIL,ZESTRIL   Take 20 mg by mouth daily.      oxyCODONE-acetaminophen 5-325 MG per tablet   Commonly known as: PERCOCET/ROXICET   Take 1-2 tablets by mouth every 4 (four) hours as needed.      ranitidine 150 MG tablet   Commonly known as: ZANTAC   Take 150 mg by mouth 2 (two) times daily.           Follow-up Information    Follow up with HOXWORTH,BENJAMIN T, MD. Schedule an appointment as soon as possible for a visit in 2 weeks. (Make an appointment in 2-3 weeks, call for follow up sooner if you  have a problem.)    Contact information:   [address] [place] [address] [phone]       Please follow up. (You can return to work on 04/08/12.  No lifting over 20 pounds for 4 weeks.)          Signed: Sherrie George 03/31/2012, 2:49 PM

## 2012-03-31 NOTE — Progress Notes (Signed)
1 Day Post-Op  Subjective: Awake alert and sitting up in chair.  States that she feels much better, + Flatus, no BM, bloating, no nausea.  Objective: Vital signs in last 24 hours: Temp:  [97.5 F (36.4 C)-98.7 F (37.1 C)] 97.6 F (36.4 C) (08/26 0600) Pulse Rate:  [58-111] 76  (08/26 1100) Resp:  [12-18] 18  (08/26 1100) BP: (117-149)/(66-84) 149/84 mmHg (08/26 1100) SpO2:  [86 %-100 %] 91 % (08/26 1100) Last BM Date: 03/29/12  Intake/Output from previous day: 08/25 0701 - 08/26 0700 In: 2554.3 [I.V.:2504.3; IV Piggyback:50] Out: 1210 [Urine:1200; Blood:10] Intake/Output this shift: Total I/O In: 600 [P.O.:600] Out: -   General appearance: alert, cooperative, appears stated age and no distress Chest: CTA bilaterally Cardiac: RRR, no M/R/G Abdomen: soft, appropriately tender, all surgical wound sites are well approximated and without erythema or drainage. No ecchymosis. +flatus, no bm, no c/o pain, n/v. VSS, afebrile.  Lab Results:   [facility] 03/29/12 2047  WBC 14.1*  HGB 11.0*  HCT 35.7*  PLT 592*   BMET  Basename 03/29/12 2047  NA 137  K 4.4  CL 101  CO2 26  GLUCOSE 102*  BUN 13  CREATININE 0.87  CALCIUM 9.8   PT/INR No results found for this basename: LABPROT:2,INR:2 in the last 72 hours ABG No results found for this basename: PHART:2,PCO2:2,PO2:2,HCO3:2 in the last 72 hours  Studies/Results: Dg Chest 2 View  03/30/2012  *RADIOLOGY REPORT*  Clinical Data: Preoperative respiratory examination for cholecystectomy.  Cough.  CHEST - 2 VIEW  Comparison: 12/09/2007 and prior chest radiographs.  Findings: The cardiomediastinal silhouette is unremarkable. Mild peribronchial thickening again identified. There is no evidence of focal airspace disease, pulmonary edema, suspicious pulmonary nodule/mass, pleural effusion, or pneumothorax. No acute bony abnormalities are identified.  IMPRESSION: No evidence of acute cardiopulmonary disease.   Original Report  Authenticated By: Rosendo Gros, M.D.    Dg Cholangiogram Operative  03/30/2012  *RADIOLOGY REPORT*  Clinical Data:   Cholecystectomy.  INTRAOPERATIVE CHOLANGIOGRAM  Technique:  Cholangiographic images from the C-arm fluoroscopic device were submitted for interpretation post-operatively.  Please see the procedural report for the amount of contrast and the fluoroscopy time utilized.  Comparison:  CT abdomen pelvis 03/30/2012.  Findings:  We are provided with a series of fluoroscopic intraoperative spot views from cholangiogram.  Surgical clips are noted.  The cystic duct is cannulated.  The common bile duct appears prominent but no filling defect is identified.  Contrast material flows into the duodenum.  There is no leakage of contrast.  IMPRESSION: Normal intraoperative cholangiogram.   Original Report Authenticated By: Bernadene Bell. Maricela Curet, M.D.    Ct Abdomen Pelvis W Contrast  03/30/2012  *RADIOLOGY REPORT*  Clinical Data: Abdominal pain  CT ABDOMEN AND PELVIS WITH CONTRAST  Technique:  Multidetector CT imaging of the abdomen and pelvis was performed following the standard protocol during bolus administration of intravenous contrast.  Contrast: OMNIPAQUE IOHEXOL 300 MG/ML  SOLN  Comparison: None.  Findings: Aortic valve calcification.  Coronary artery calcification.  The lung bases are clear.  Homogeneous hepatic enhancement.  Nonspecific hypodensity within the spleen. Gallstones.  No pericholecystic fluid or gallbladder wall thickening.  Minimal intra and extrahepatic biliary ductal prominence to the level of the ampulla.  No pancreatic ductal dilatation 6 mm hypodensity within the uncinate process.  Otherwise homogeneous pancreatic enhancement.  Unremarkable adrenal glands.  Subcentimeter hypodensity left kidney.  Otherwise symmetric renal enhancement.  No hydronephrosis or hydroureter.  The no bowel obstruction.  Normal appendix.  No CT evidence for colitis.  No free intraperitoneal air or fluid.   No lymphadenopathy.  Thin-walled partially decompressed bladder.  Unremarkable CT appearance to the uterus and adnexa.  There is scattered atherosclerotic calcification of the aorta and its branches. No aneurysmal dilatation.  Mild multilevel degenerative changes.  No acute osseous finding.  IMPRESSION: Cholelithiasis.  No CT evidence for cholecystitis.  Mild intra and extrahepatic biliary ductal prominence to the level of the ampulla.  No obstructing lesion identified.  Correlate with LFTs and ERCP or MRCP if clinically warranted.   Original Report Authenticated By: Waneta Martins, M.D.     Anti-infectives: Anti-infectives     Start     Dose/Rate Route Frequency Ordered Stop   03/31/12 0530   ertapenem (INVANZ) 1 g in sodium chloride 0.9 % 50 mL IVPB        1 g 100 mL/hr over 30 Minutes Intravenous Every 24 hours 03/30/12 1816 03/31/12 0455   03/30/12 0530   ertapenem (INVANZ) 1 g in sodium chloride 0.9 % 50 mL IVPB  Status:  Discontinued        1 g 100 mL/hr over 30 Minutes Intravenous Every 24 hours 03/30/12 0443 03/30/12 1816          Assessment/Plan: s/p Procedure(s) (LRB): LAPAROSCOPIC CHOLECYSTECTOMY WITH INTRAOPERATIVE CHOLANGIOGRAM (N/A)  Advance diet Probable discharge later today if no n/v after advancement of diet. Follow up with Dr. Johna Sheriff in 2 weeks time.  LOS: 2 days    Denise [name] 03/31/2012

## 2012-03-31 NOTE — Progress Notes (Signed)
Patient discharged home in stable condition 

## 2012-03-31 NOTE — Progress Notes (Signed)
D/C patient from hospital when patient meets criteria:  Tolerating oral intake well Ambulating in walkways Adequate pain control without IV medications Urinating  Having flatus  

## 2012-03-31 NOTE — Discharge Summary (Signed)
Improving OK to d/c w close f/u

## 2012-04-03 NOTE — ED Provider Notes (Signed)
Medical screening examination/treatment/procedure(s) were conducted as a shared visit with non-physician practitioner(s) and myself.  I personally evaluated the patient during the encounter.  Right upper quadrant pain.  White count 14 K. CT scan shows gallstones. Consult general surgery  Donnetta Hutching, MD 04/03/12 2111

## 2012-06-23 ENCOUNTER — Encounter (HOSPITAL_COMMUNITY): Payer: Self-pay

## 2012-10-04 ENCOUNTER — Ambulatory Visit: Payer: Self-pay | Admitting: Emergency Medicine

## 2012-10-04 VITALS — BP 142/80 | HR 93 | Temp 98.2°F | Resp 18 | Ht 63.0 in | Wt 210.0 lb

## 2012-10-04 DIAGNOSIS — J018 Other acute sinusitis: Secondary | ICD-10-CM

## 2012-10-04 MED ORDER — AMOXICILLIN-POT CLAVULANATE 875-125 MG PO TABS: 1.0000 | ORAL_TABLET | Freq: Two times a day (BID) | ORAL | Status: DC

## 2012-10-04 MED ORDER — PSEUDOEPHEDRINE-GUAIFENESIN ER 60-600 MG PO TB12: 1.0000 | ORAL_TABLET | Freq: Two times a day (BID) | ORAL | Status: AC

## 2012-10-04 NOTE — Patient Instructions (Addendum)

## 2012-10-04 NOTE — Progress Notes (Signed)
[facility] [address] [place] [address] [phone]  Date:  10/04/2012   Name:  Denise [name]   DOB:  [birth year]   MRN:  [id number]  PCP:  No primary provider on file.    Chief Complaint: Sinusitis   History of Present Illness:  Denise [name] is a 52 y.o. very pleasant female patient who presents with the following:  Ill past week or so with green nasal drainage, post nasal drip and cough with purulent sputum.  No documented fever or chills.  No wheezing or shortness of breath.  No nausea or vomiting. No stool change or rash.  No improvement with OTC medications.  Patient Active Problem List  Diagnosis  . Cholecystitis with cholelithiasis  . Hypertension  . BMI 35.0-35.9,adult    Past Medical History  Diagnosis Date  . Shingles   . Hypertension   . Cholecystitis with cholelithiasis 03/31/2012  . Hypertension 03/31/2012  . BMI 35.0-35.9,adult 03/31/2012    Past Surgical History  Procedure Laterality Date  . Tubal ligation    . Cholecystectomy  03/30/2012    Procedure: LAPAROSCOPIC CHOLECYSTECTOMY WITH INTRAOPERATIVE CHOLANGIOGRAM;  Surgeon: Mariella Saa, MD;  Location: [facility];  Service: General;  Laterality: N/A;    History  Substance Use Topics  . Smoking status: Current Every Day Smoker    Types: Cigarettes  . Smokeless tobacco: Not on file  . Alcohol Use: No    Family History  Problem Relation Age of Onset  . Heart disease Father     Allergies  Allergen Reactions  . Codeine Itching    Medication list has been reviewed and updated.  Current Outpatient Prescriptions on File Prior to Visit  Medication Sig Dispense Refill  . amLODipine (NORVASC) 10 MG tablet Take 10 mg by mouth daily.      Marland Kitchen buPROPion (WELLBUTRIN XL) 300 MG 24 hr tablet Take 300 mg by mouth daily.      Marland Kitchen lisinopril (PRINIVIL,ZESTRIL) 20 MG tablet Take 20 mg by mouth daily.      . ranitidine (ZANTAC) 150 MG tablet Take 150 mg by mouth 2 (two) times daily  as needed.       Marland Kitchen acetaminophen (TYLENOL) 325 MG tablet Take 650 mg by mouth every 6 (six) hours as needed. Pain      . simethicone (GAS-X) 80 MG chewable tablet Chew 80 mg by mouth every 6 (six) hours as needed. Flatulence       No current facility-administered medications on file prior to visit.    Review of Systems:  As per HPI, otherwise negative.    Physical Examination: Filed Vitals:   10/04/12 1123  BP: 142/80  Pulse: 93  Temp: 98.2 F (36.8 C)  Resp: 18   Filed Vitals:   10/04/12 1123  Height: 5\' 3"  (1.6 m)  Weight: 210 lb (95.255 kg)   Body mass index is 37.21 kg/(m^2). Ideal Body Weight: Weight in (lb) to have BMI = 25: 140.8  GEN: WDWN, NAD, Non-toxic, A & O x 3 HEENT: Atraumatic, Normocephalic. Neck supple. No masses, No LAD. Ears and Nose: No external deformity. CV: RRR, No M/G/R. No JVD. No thrill. No extra heart sounds. PULM: CTA B, no wheezes, crackles, rhonchi. No retractions. No resp. distress. No accessory muscle use. ABD: S, NT, ND, +BS. No rebound. No HSM. EXTR: No c/c/e NEURO Normal gait.  PSYCH: Normally interactive. Conversant. Not depressed or anxious appearing.  Calm demeanor.    Assessment  and Plan: Sinusitis augmentin mucinex d  Carmelina Dane, MD

## 2016-06-08 ENCOUNTER — Ambulatory Visit (INDEPENDENT_AMBULATORY_CARE_PROVIDER_SITE_OTHER): Payer: Managed Care, Other (non HMO) | Admitting: Physician Assistant

## 2016-06-08 VITALS — BP 152/92 | HR 114 | Temp 98.9°F | Resp 17 | Ht 64.5 in | Wt 190.0 lb

## 2016-06-08 DIAGNOSIS — I10 Essential (primary) hypertension: Secondary | ICD-10-CM

## 2016-06-08 DIAGNOSIS — R519 Headache, unspecified: Secondary | ICD-10-CM

## 2016-06-08 DIAGNOSIS — R51 Headache: Secondary | ICD-10-CM

## 2016-06-08 DIAGNOSIS — R011 Cardiac murmur, unspecified: Secondary | ICD-10-CM | POA: Diagnosis not present

## 2016-06-08 MED ORDER — LISINOPRIL 20 MG PO TABS: 20.0000 mg | ORAL_TABLET | Freq: Every day | ORAL | 3 refills | Status: DC

## 2016-06-08 MED ORDER — AMLODIPINE BESYLATE 10 MG PO TABS: 10.0000 mg | ORAL_TABLET | Freq: Every day | ORAL | 3 refills | Status: DC

## 2016-06-08 NOTE — Patient Instructions (Addendum)
Your medications are at your pharmacy. Please make an appointment to come back and see Denise [name] in three months. At that time we will do lab work and revisit your heart murmur. It was nice meeting you.   Thank you for coming in today. I hope you feel we met your needs.  Feel free to call [facility] if you have any questions or further requests.  Please consider signing up for MyChart if you do not already have it, as this is a great way to communicate with me.  Best,  Whitney McVey, PA-C  DASH Eating Plan DASH stands for "Dietary Approaches to Stop Hypertension." The DASH eating plan is a healthy eating plan that has been shown to reduce high blood pressure (hypertension). Additional health benefits may include reducing the risk of type 2 diabetes mellitus, heart disease, and stroke. The DASH eating plan may also help with weight loss. WHAT DO I NEED TO KNOW ABOUT THE DASH EATING PLAN? For the DASH eating plan, you will follow these general guidelines:  Choose foods with a percent daily value for sodium of less than 5% (as listed on the food label).  Use salt-free seasonings or herbs instead of table salt or sea salt.  Check with your health care provider or pharmacist before using salt substitutes.  Eat lower-sodium products, often labeled as "lower sodium" or "no salt added."  Eat fresh foods.  Eat more vegetables, fruits, and low-fat dairy products.  Choose whole grains. Look for the word "whole" as the first word in the ingredient list.  Choose fish and skinless chicken or [place] more often than red meat. Limit fish, poultry, and meat to 6 oz (170 g) each day.  Limit sweets, desserts, sugars, and sugary drinks.  Choose heart-healthy fats.  Limit cheese to 1 oz (28 g) per day.  Eat more home-cooked food and less restaurant, buffet, and fast food.  Limit fried foods.  Cook foods using methods other than frying.  Limit canned vegetables. If you do use them, rinse them well to decrease  the sodium.  When eating at a restaurant, ask that your food be prepared with less salt, or no salt if possible. WHAT FOODS CAN I EAT? Seek help from a dietitian for individual calorie needs. Grains Whole grain or whole wheat bread. Brown rice. Whole grain or whole wheat pasta. Quinoa, bulgur, and whole grain cereals. Low-sodium cereals. Corn or whole wheat flour tortillas. Whole grain cornbread. Whole grain crackers. Low-sodium crackers. Vegetables Fresh or frozen vegetables (raw, steamed, roasted, or grilled). Low-sodium or reduced-sodium tomato and vegetable juices. Low-sodium or reduced-sodium tomato sauce and paste. Low-sodium or reduced-sodium canned vegetables.  Fruits All fresh, canned (in natural juice), or frozen fruits. Meat and Other Protein Products Ground beef (85% or leaner), grass-fed beef, or beef trimmed of fat. Skinless chicken or [place]. Ground chicken or [place]. Pork trimmed of fat. All fish and seafood. Eggs. Dried beans, peas, or lentils. Unsalted nuts and seeds. Unsalted canned beans. Dairy Low-fat dairy products, such as skim or 1% milk, 2% or reduced-fat cheeses, low-fat ricotta or cottage cheese, or plain low-fat yogurt. Low-sodium or reduced-sodium cheeses. Fats and Oils Tub margarines without trans fats. Light or reduced-fat mayonnaise and salad dressings (reduced sodium). Avocado. Safflower, olive, or canola oils. Natural peanut or almond butter. Other Unsalted popcorn and pretzels. The items listed above may not be a complete list of recommended foods or beverages. Contact your dietitian for more options. WHAT FOODS ARE NOT RECOMMENDED? Grains White bread.  White pasta. White rice. Refined cornbread. Bagels and croissants. Crackers that contain trans fat. Vegetables Creamed or fried vegetables. Vegetables in a cheese sauce. Regular canned vegetables. Regular canned tomato sauce and paste. Regular tomato and vegetable juices. Fruits Dried fruits. Canned fruit in  light or heavy syrup. Fruit juice. Meat and Other Protein Products Fatty cuts of meat. Ribs, chicken wings, bacon, sausage, bologna, salami, chitterlings, fatback, hot dogs, bratwurst, and packaged luncheon meats. Salted nuts and seeds. Canned beans with salt. Dairy Whole or 2% milk, cream, half-and-half, and cream cheese. Whole-fat or sweetened yogurt. Full-fat cheeses or blue cheese. Nondairy creamers and whipped toppings. Processed cheese, cheese spreads, or cheese curds. Condiments Onion and garlic salt, seasoned salt, table salt, and sea salt. Canned and packaged gravies. Worcestershire sauce. Tartar sauce. Barbecue sauce. Teriyaki sauce. Soy sauce, including reduced sodium. Steak sauce. Fish sauce. Oyster sauce. Cocktail sauce. Horseradish. Ketchup and mustard. Meat flavorings and tenderizers. Bouillon cubes. Hot sauce. Tabasco sauce. Marinades. Taco seasonings. Relishes. Fats and Oils Butter, stick margarine, lard, shortening, ghee, and bacon fat. Coconut, palm kernel, or palm oils. Regular salad dressings. Other Pickles and olives. Salted popcorn and pretzels. The items listed above may not be a complete list of foods and beverages to avoid. Contact your dietitian for more information. WHERE CAN I FIND MORE INFORMATION? National Heart, Lung, and Blood Institute: travelstabloid.com   This information is not intended to replace advice given to you by your health care provider. Make sure you discuss any questions you have with your health care provider.   Document Released: 07/12/2011 Document Revised: 08/13/2014 Document Reviewed: 05/27/2013 Elsevier Interactive Patient Education 2016 [employer].   IF you received an x-ray today, you will receive an invoice from [facility]. Please contact [facility] at [phone] with questions or concerns regarding your invoice.   IF you received labwork today, you will receive an invoice from  [employer]. Please contact [facility] at [phone] with questions or concerns regarding your invoice.   Our billing staff will not be able to assist you with questions regarding bills from these companies.  You will be contacted with the lab results as soon as they are available. The fastest way to get your results is to activate your My Chart account. Instructions are located on the last page of this paperwork. If you have not heard from Denise [name] regarding the results in 2 weeks, please contact this office.
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
## 2016-06-11 ENCOUNTER — Ambulatory Visit (INDEPENDENT_AMBULATORY_CARE_PROVIDER_SITE_OTHER): Payer: Managed Care, Other (non HMO) | Admitting: Family Medicine

## 2016-06-11 VITALS — BP 142/78 | HR 116 | Temp 98.6°F | Resp 17 | Ht 64.5 in | Wt 188.0 lb

## 2016-06-11 DIAGNOSIS — I358 Other nonrheumatic aortic valve disorders: Secondary | ICD-10-CM | POA: Diagnosis not present

## 2016-06-11 DIAGNOSIS — F17200 Nicotine dependence, unspecified, uncomplicated: Secondary | ICD-10-CM | POA: Diagnosis not present

## 2016-06-11 DIAGNOSIS — Z23 Encounter for immunization: Secondary | ICD-10-CM | POA: Diagnosis not present

## 2016-06-11 DIAGNOSIS — R000 Tachycardia, unspecified: Secondary | ICD-10-CM | POA: Diagnosis not present

## 2016-06-11 DIAGNOSIS — M25531 Pain in right wrist: Secondary | ICD-10-CM | POA: Diagnosis not present

## 2016-06-11 DIAGNOSIS — I10 Essential (primary) hypertension: Secondary | ICD-10-CM | POA: Diagnosis not present

## 2016-06-11 MED ORDER — METOPROLOL SUCCINATE ER 25 MG PO TB24: 25.0000 mg | ORAL_TABLET | Freq: Every day | ORAL | 1 refills | Status: DC

## 2016-06-11 MED ORDER — ADULT BLOOD PRESSURE CUFF LG KIT: 1.0000 [IU] | PACK | Freq: Every day | 0 refills | Status: DC | PRN

## 2016-06-11 NOTE — Patient Instructions (Addendum)
IF you received an x-ray today, you will receive an invoice from [facility]. Please contact [facility] at [phone] with questions or concerns regarding your invoice.   IF you received labwork today, you will receive an invoice from [facility]. Please contact [facility] at [phone] with questions or concerns regarding your invoice.   Our billing staff will not be able to assist you with questions regarding bills from these companies.  You will be contacted with the lab results as soon as they are available. The fastest way to get your results is to activate your My Chart account. Instructions are located on the last page of this paperwork. If you have not heard from us regarding the results in 2 weeks, please contact this office.     We recommend that you schedule a mammogram for breast cancer screening. Typically, you do not need a referral to do this. Please contact a local imaging center to schedule your mammogram.  [facility] - [phone]  *ask for the Radiology Department [facility]) - [phone] or [phone]  [facility] - [phone] [facility] - [phone] [facility] - [phone]  *ask for the Radiology Department [facility] - [phone]  *ask for the Radiology Department [facility] - [phone]  *ask for the Mammography Department [facility] - [phone] Heart Murmur A heart murmur is an extra sound heard by your health care provider when listening to your heart with a device called a stethoscope. The sound comes from turbulence when blood flows through the heart and may be a "hum" or "whoosh" sound heard when the heart beats. There are two types of heart murmurs:  Innocent murmurs. Most people with this type of heart murmur do not have a heart problem. Many children have innocent heart  murmurs. Your health care provider may suggest some basic testing to know whether your murmur is an innocent murmur. If an innocent heart murmur is found, there is no need for further tests or treatment and no need to restrict activities or stop playing sports.  Abnormal murmurs. These types of murmurs can occur in children and adults. In children, abnormal heart murmurs are typically caused from heart defects that are present at birth (congenital). In adults, abnormal murmurs are usually from heart valve problems caused by disease, infection, or aging. CAUSES  Normally, these valves open to let blood flow through or out of your heart and then shut to keep it from flowing backward. If they do not work properly, you could have:  Regurgitation--When blood leaks back through the valve in the wrong direction.  Mitral valve prolapse--When the mitral valve of the heart has a loose flap and does not close tightly.  Stenosis--When the valve does not open enough and blocks blood flow. SIGNS AND SYMPTOMS  Innocent murmurs do not cause symptoms, and many people with abnormal murmurs may or may not have symptoms. If symptoms do develop, they may include:  Shortness of breath.  Blue coloring of the skin, especially on the fingertips.  Chest pain.  Palpitations, or feeling a fluttering or skipped heartbeat.  Fainting.  Persistent cough.  Getting tired much faster than expected. DIAGNOSIS  A heart murmur might be heard during a sports physical or during any type of examination. When a murmur is heard, it may suggest a possible problem. When this happens, your health care provider may ask you  to see a heart specialist (cardiologist). You may also be asked to have one or more heart tests. In these cases, testing may vary depending on what your health care provider heard. Tests for a heart murmur may include:  Electrocardiogram.  Echocardiogram.  MRI. For children and adults who have an abnormal  heart murmur and want to play sports, it is important to complete testing, review test results, and receive recommendations from your health care provider. If heart disease is present, it may not be safe to play. TREATMENT  Innocent murmurs require no treatment or activity restriction. If an abnormal murmur represents a problem with the heart, treatment will depend on the exact nature of the problem. In these cases, medicine or surgery may be needed to treat the problem. HOME CARE INSTRUCTIONS If you want to participate in sports or other types of strenuous physical activity, it is important to discuss this first with your health care provider. If the murmur represents a problem with the heart and you choose to participate in sports, there is a small chance that a serious problem (including sudden death) could result.  SEEK MEDICAL CARE IF:   You feel that your symptoms are slowly worsening.  You develop any new symptoms that cause concern.  You feel that you are having side effects from any medicines prescribed. SEEK IMMEDIATE MEDICAL CARE IF:   You develop chest pain.  You have shortness of breath.  You notice that your heart beats irregularly often enough to cause you to worry.  You have fainting spells.  Your symptoms suddenly get worse.   This information is not intended to replace advice given to you by your health care provider. Make sure you discuss any questions you have with your health care provider.   Document Released: 08/30/2004 Document Revised: 08/13/2014 Document Reviewed: 03/30/2013 Elsevier Interactive Patient Education [employer]2016 Elsevier Inc.

## 2016-06-11 NOTE — Progress Notes (Signed)
Chief Complaint  Patient presents with  . Medication Problem    Feels she was supposed to recieve 13m lisinopril but was given 262m . Arm Pain    HPI  Hypertension and Tachycardia She was out of her medication for hypertension - amlodipine 107m lisinopril/hctz 20/12.5 She reports that she was given on 06/08/2016 - amlodipine 11m61m lisinopril 20mg71me reports that that she would like to get back on all the same medications she was taking before. She denies lightheadedness currently and denies CP but reports occasional palpitations and that she always feels nervous.  She reports that she was feeling lightheaded when she took her lisinopril and amlodipine Saturday and was worried that she might have not had enough blood pressure medication.  Bulging veins Right arm with bulging veins since August 2017 She had tick bite in the back shoulder blade a 2016 that was removed but was difficult to remove in the right shoulder blade She also had an injury in the right arm above where the first vein bulged  Tobacco Use She also quit smoking with nicotine patches  She reports that the patches also caused her to have more noticeable veins and caused rashes and irritation   Past Medical History:  Diagnosis Date  . Allergy   . BMI 35.0-35.9,adult 03/31/2012  . Cholecystitis with cholelithiasis 03/31/2012  . Hypertension   . Hypertension 03/31/2012  . Shingles     Current Outpatient Prescriptions  Medication Sig Dispense Refill  . amLODipine (NORVASC) 10 MG tablet Take 1 tablet (10 mg total) by mouth daily. 30 tablet 3  . lisinopril (PRINIVIL,ZESTRIL) 20 MG tablet Take 1 tablet (20 mg total) by mouth daily. (Patient taking differently: Take 5 mg by mouth daily. ) 30 tablet 3  . ranitidine (ZANTAC) 150 MG tablet Take 150 mg by mouth 2 (two) times daily as needed.     . Blood Pressure Monitoring (ADULT BLOOD PRESSURE CUFF LG) KIT 1 Units by Does not apply route daily as needed. 1 each 0   . metoprolol succinate (TOPROL-XL) 25 MG 24 hr tablet Take 1 tablet (25 mg total) by mouth daily. 30 tablet 1   No current facility-administered medications for this visit.     Allergies:  Allergies  Allergen Reactions  . Codeine Itching    Past Surgical History:  Procedure Laterality Date  . CHOLECYSTECTOMY  03/30/2012   Procedure: LAPAROSCOPIC CHOLECYSTECTOMY WITH INTRAOPERATIVE CHOLANGIOGRAM;  Surgeon: BenjaEdward Jolly  Location: [facility];  Service: General;  Laterality: N/A;  . TUBAL LIGATION      Social History   Social History  . Marital status: Widowed    Spouse name: N/A  . Number of children: N/A  . Years of education: N/A   Social History Main Topics  . Smoking status: Current Every Day Smoker    Packs/day: 1.00    Years: 34.00    Types: Cigarettes  . Smokeless tobacco: Never Used  . Alcohol use No  . Drug use: No  . Sexual activity: No   Other Topics Concern  . None   Social History Narrative  . None    ROS  Objective: Vitals:   06/11/16 1339  BP: (!) 142/78  Pulse: (!) 116  Resp: 17  Temp: 98.6 F (37 C)  TempSrc: Oral  SpO2: 96%  Weight: 188 lb (85.3 kg)  Height: 5' 4.5" (1.638 m)    Physical Exam  Constitutional: She appears well-developed and well-nourished.  HENT:  Head: Normocephalic and atraumatic.  Eyes: Conjunctivae and EOM are normal.  Cardiovascular: S1 normal, S2 normal and normal pulses.  Tachycardia present.   Murmur heard.  Systolic murmur is present with a grade of 2/6  Pulmonary/Chest: Effort normal and breath sounds normal. No respiratory distress. She has no wheezes.  Skin:  Skin with normal appearing veins No visible tick bites or rash    Assessment and Plan Denise [name] was seen today for medication problem and arm pain.  Diagnoses and all orders for this visit:  Essential hypertension- previous meds included lisinopril/hctz 20-12.5 with Amlodipine 24m. This was changed to Lisinopril 229mwith Amlodipine  1054m Added Metoprolol 65m48m help with blood pressure control and to reduce the tachycardia -     Ambulatory referral to Cardiology  Heart murmur, aortic- pt is aware of this murmur in the past but has not had a work up -     Cancel: ECHOCARDIOGRAM COMPLETE; Future -     Ambulatory referral to Cardiology  Right wrist pain- discussed that it could be arthritis but no clear findings of carpal tunnel syndrome  Tobacco use disorder- pt to resume smoking cessation efforts with gum instead of patches since the patch was so irritating to her skin  Tachycardia- advised to avoid coffee and limit to 4-6 ounces a day Discussed that she should maintain hydration She should not take the beta blocker the day of her Echo -     Ambulatory referral to Cardiology  Need for prophylactic vaccination and inoculation against influenza -     Flu Vaccine QUAD 36+ mos IM  Other orders -     Blood Pressure Monitoring (ADULT BLOOD PRESSURE CUFF LG) KIT; 1 Units by Does not apply route daily as needed. -     metoprolol succinate (TOPROL-XL) 25 MG 24 hr tablet; Take 1 tablet (25 mg total) by mouth daily.  A total of 40 minutes were spent face-to-face with the patient during this encounter and over half of that time was spent on counseling and coordination of care.    Denise [name]

## 2016-06-18 ENCOUNTER — Ambulatory Visit: Payer: Managed Care, Other (non HMO) | Admitting: Primary Care

## 2016-10-10 ENCOUNTER — Ambulatory Visit (INDEPENDENT_AMBULATORY_CARE_PROVIDER_SITE_OTHER): Payer: Managed Care, Other (non HMO) | Admitting: Primary Care

## 2016-10-10 ENCOUNTER — Encounter: Payer: Self-pay | Admitting: Primary Care

## 2016-10-10 ENCOUNTER — Encounter (INDEPENDENT_AMBULATORY_CARE_PROVIDER_SITE_OTHER): Payer: Self-pay

## 2016-10-10 DIAGNOSIS — I10 Essential (primary) hypertension: Secondary | ICD-10-CM

## 2016-10-10 MED ORDER — AMLODIPINE BESYLATE 10 MG PO TABS: 10.0000 mg | ORAL_TABLET | Freq: Every day | ORAL | 1 refills | Status: DC

## 2016-10-10 MED ORDER — LISINOPRIL 20 MG PO TABS: 20.0000 mg | ORAL_TABLET | Freq: Every day | ORAL | 1 refills | Status: DC

## 2016-10-10 NOTE — Progress Notes (Signed)
Pre visit review using our clinic review tool, if applicable. No additional management support is needed unless otherwise documented below in the visit note. 

## 2016-10-10 NOTE — Progress Notes (Signed)
   Subjective:    Patient ID: Denise [name], female    DOB: 01/12/1961, 56 y.o.   MRN: [id number]  HPI  Denise [name] is a 56 year old female who presents today to establish care and discuss the problems mentioned below. Will obtain old records. Her last physical was a company screening in March 2017.   1) Essential Hypertension: Diagnosed years ago. Currently managed on Amlodipine 10 mg and Lisinopril 20 mg. She is out of one of her blood pressure medications, but doesn't know which one. She needs refills of both medications. Her BP in the office today is 144/80. She does check her BP at home occasionally which runs 120's-130's/70-80's. She denies chest pain, dizziness, shortness of breath, visual changes.   Review of Systems  Eyes: Negative for visual disturbance.  Respiratory: Negative for cough and shortness of breath.   Cardiovascular: Negative for chest pain and palpitations.  Neurological: Negative for dizziness and headaches.       Past Medical History:  Diagnosis Date  . Allergy   . BMI 35.0-35.9,adult 03/31/2012  . Chickenpox   . Cholecystitis with cholelithiasis 03/31/2012  . Heart murmur   . Hypertension 03/31/2012  . Shingles      Social History   Social History  . Marital status: Widowed    Spouse name: N/A  . Number of children: N/A  . Years of education: N/A   Occupational History  . Not on file.   Social History Main Topics  . Smoking status: Current Every Day Smoker    Packs/day: 0.75    Years: 34.00    Types: Cigarettes  . Smokeless tobacco: Never Used  . Alcohol use No  . Drug use: No  . Sexual activity: No   Other Topics Concern  . Not on file   Social History Narrative   Widow.    2 children.   Works in [employer] Service.   Enjoys reading, cooking.     Past Surgical History:  Procedure Laterality Date  . CHOLECYSTECTOMY  03/30/2012   Procedure: LAPAROSCOPIC CHOLECYSTECTOMY WITH INTRAOPERATIVE CHOLANGIOGRAM;  Surgeon: Mariella SaaBenjamin T Hoxworth, MD;   Location: [facility];  Service: General;  Laterality: N/A;  . TUBAL LIGATION      Family History  Problem Relation Age of Onset  . Arthritis Mother   . Cervical cancer Mother   . Heart disease Father   . Hypertension Father     Allergies  Allergen Reactions  . Codeine Itching    No current outpatient prescriptions on file prior to visit.   No current facility-administered medications on file prior to visit.     BP (!) 144/80   Pulse 88   Temp 98.2 F (36.8 C) (Oral)   Ht 5\' 5"  (1.651 m)   Wt 189 lb 12.8 oz (86.1 kg)   SpO2 96%   BMI 31.58 kg/m    Objective:   Physical Exam  Constitutional: She appears well-nourished.  Neck: Neck supple.  Cardiovascular: Normal rate and regular rhythm.   Pulmonary/Chest: Effort normal and breath sounds normal.  Skin: Skin is warm and dry.  Psychiatric: She has a normal mood and affect.          Assessment & Plan:

## 2016-10-10 NOTE — Assessment & Plan Note (Signed)
Managed on lisinopril 20 mg and amlodipine 10 mg. Refilled both meds as she is out. Will call in three weeks for BP check. Will obtain records for recent BMP.

## 2016-10-10 NOTE — Patient Instructions (Signed)
Monitor your blood pressure daily over the next several weeks. I will call you in 3 weeks for your blood pressure readings to make sure they are coming down.  It was a pleasure to meet you today! Please don't hesitate to call me with any questions. Welcome to [facility]!   DASH Eating Plan DASH stands for "Dietary Approaches to Stop Hypertension." The DASH eating plan is a healthy eating plan that has been shown to reduce high blood pressure (hypertension). It may also reduce your risk for type 2 diabetes, heart disease, and stroke. The DASH eating plan may also help with weight loss. What are tips for following this plan? General guidelines   Avoid eating more than 2,300 mg (milligrams) of salt (sodium) a day. If you have hypertension, you may need to reduce your sodium intake to 1,500 mg a day.  Limit alcohol intake to no more than 1 drink a day for nonpregnant women and 2 drinks a day for men. One drink equals 12 oz of beer, 5 oz of wine, or 1 oz of hard liquor.  Work with your health care provider to maintain a healthy body weight or to lose weight. Ask what an ideal weight is for you.  Get at least 30 minutes of exercise that causes your heart to beat faster (aerobic exercise) most days of the week. Activities may include walking, swimming, or biking.  Work with your health care provider or diet and nutrition specialist (dietitian) to adjust your eating plan to your individual calorie needs. Reading food labels   Check food labels for the amount of sodium per serving. Choose foods with less than 5 percent of the Daily Value of sodium. Generally, foods with less than 300 mg of sodium per serving fit into this eating plan.  To find whole grains, look for the word "whole" as the first word in the ingredient list. Shopping   Buy products labeled as "low-sodium" or "no salt added."  Buy fresh foods. Avoid canned foods and premade or frozen meals. Cooking   Avoid adding salt when  cooking. Use salt-free seasonings or herbs instead of table salt or sea salt. Check with your health care provider or pharmacist before using salt substitutes.  Do not fry foods. Cook foods using healthy methods such as baking, boiling, grilling, and broiling instead.  Cook with heart-healthy oils, such as olive, canola, soybean, or sunflower oil. Meal planning    Eat a balanced diet that includes:  5 or more servings of fruits and vegetables each day. At each meal, try to fill half of your plate with fruits and vegetables.  Up to 6-8 servings of whole grains each day.  Less than 6 oz of lean meat, poultry, or fish each day. A 3-oz serving of meat is about the same size as a deck of cards. One egg equals 1 oz.  2 servings of low-fat dairy each day.  A serving of nuts, seeds, or beans 5 times each week.  Heart-healthy fats. Healthy fats called Omega-3 fatty acids are found in foods such as flaxseeds and coldwater fish, like sardines, salmon, and mackerel.  Limit how much you eat of the following:  Canned or prepackaged foods.  Food that is high in trans fat, such as fried foods.  Food that is high in saturated fat, such as fatty meat.  Sweets, desserts, sugary drinks, and other foods with added sugar.  Full-fat dairy products.  Do not salt foods before eating.  Try to eat at  least 2 vegetarian meals each week.  Eat more home-cooked food and less restaurant, buffet, and fast food.  When eating at a restaurant, ask that your food be prepared with less salt or no salt, if possible. What foods are recommended? The items listed may not be a complete list. Talk with your dietitian about what dietary choices are best for you. Grains  Whole-grain or whole-wheat bread. Whole-grain or whole-wheat pasta. Brown rice. Orpah Cobbatmeal. Quinoa. Bulgur. Whole-grain and low-sodium cereals. Pita bread. Low-fat, low-sodium crackers. Whole-wheat flour tortillas. Vegetables  Fresh or frozen  vegetables (raw, steamed, roasted, or grilled). Low-sodium or reduced-sodium tomato and vegetable juice. Low-sodium or reduced-sodium tomato sauce and tomato paste. Low-sodium or reduced-sodium canned vegetables. Fruits  All fresh, dried, or frozen fruit. Canned fruit in natural juice (without added sugar). Meat and other protein foods  Skinless chicken or Malawiturkey. Ground chicken or Malawiturkey. Pork with fat trimmed off. Fish and seafood. Egg whites. Dried beans, peas, or lentils. Unsalted nuts, nut butters, and seeds. Unsalted canned beans. Lean cuts of beef with fat trimmed off. Low-sodium, lean deli meat. Dairy  Low-fat (1%) or fat-free (skim) milk. Fat-free, low-fat, or reduced-fat cheeses. Nonfat, low-sodium ricotta or cottage cheese. Low-fat or nonfat yogurt. Low-fat, low-sodium cheese. Fats and oils  Soft margarine without trans fats. Vegetable oil. Low-fat, reduced-fat, or light mayonnaise and salad dressings (reduced-sodium). Canola, safflower, olive, soybean, and sunflower oils. Avocado. Seasoning and other foods  Herbs. Spices. Seasoning mixes without salt. Unsalted popcorn and pretzels. Fat-free sweets. What foods are not recommended? The items listed may not be a complete list. Talk with your dietitian about what dietary choices are best for you. Grains  Baked goods made with fat, such as croissants, muffins, or some breads. Dry pasta or rice meal packs. Vegetables  Creamed or fried vegetables. Vegetables in a cheese sauce. Regular canned vegetables (not low-sodium or reduced-sodium). Regular canned tomato sauce and paste (not low-sodium or reduced-sodium). Regular tomato and vegetable juice (not low-sodium or reduced-sodium). Rosita FirePickles. Olives. Fruits  Canned fruit in a light or heavy syrup. Fried fruit. Fruit in cream or butter sauce. Meat and other protein foods  Fatty cuts of meat. Ribs. Fried meat. Tomasa BlaseBacon. Sausage. Bologna and other processed lunch meats. Salami. Fatback. Hotdogs.  Bratwurst. Salted nuts and seeds. Canned beans with added salt. Canned or smoked fish. Whole eggs or egg yolks. Chicken or Malawiturkey with skin. Dairy  Whole or 2% milk, cream, and half-and-half. Whole or full-fat cream cheese. Whole-fat or sweetened yogurt. Full-fat cheese. Nondairy creamers. Whipped toppings. Processed cheese and cheese spreads. Fats and oils  Butter. Stick margarine. Lard. Shortening. Ghee. Bacon fat. Tropical oils, such as coconut, palm kernel, or palm oil. Seasoning and other foods  Salted popcorn and pretzels. Onion salt, garlic salt, seasoned salt, table salt, and sea salt. Worcestershire sauce. Tartar sauce. Barbecue sauce. Teriyaki sauce. Soy sauce, including reduced-sodium. Steak sauce. Canned and packaged gravies. Fish sauce. Oyster sauce. Cocktail sauce. Horseradish that you find on the shelf. Ketchup. Mustard. Meat flavorings and tenderizers. Bouillon cubes. Hot sauce and Tabasco sauce. Premade or packaged marinades. Premade or packaged taco seasonings. Relishes. Regular salad dressings. Where to find more information:  National Heart, Lung, and Blood Institute: PopSteam.iswww.nhlbi.nih.gov  American Heart Association: www.heart.org Summary  The DASH eating plan is a healthy eating plan that has been shown to reduce high blood pressure (hypertension). It may also reduce your risk for type 2 diabetes, heart disease, and stroke.  With the DASH eating plan, you should  limit salt (sodium) intake to 2,300 mg a day. If you have hypertension, you may need to reduce your sodium intake to 1,500 mg a day.  When on the DASH eating plan, aim to eat more fresh fruits and vegetables, whole grains, lean proteins, low-fat dairy, and heart-healthy fats.  Work with your health care provider or diet and nutrition specialist (dietitian) to adjust your eating plan to your individual calorie needs. This information is not intended to replace advice given to you by your health care provider. Make sure  you discuss any questions you have with your health care provider. Document Released: 07/12/2011 Document Revised: 07/16/2016 Document Reviewed: 07/16/2016 Elsevier Interactive Patient Education  2017 [employer].

## 2016-10-31 ENCOUNTER — Telehealth: Payer: Self-pay | Admitting: Primary Care

## 2016-10-31 NOTE — Telephone Encounter (Signed)
-----   Message from Doreene NestKatherine K Aivah Putman, NP sent at 10/10/2016  2:40 PM EST ----- Regarding: BP Please check on patient's BP since we restarted her medications.

## 2016-10-31 NOTE — Telephone Encounter (Signed)
Lm on pts vm requesting a call back with most recent BP readings

## 2016-11-07 NOTE — Telephone Encounter (Signed)
Per DPR, left detail message of [name]'s comments for patient to call back. 

## 2016-11-08 NOTE — Telephone Encounter (Signed)
Per DPR, left detail message of [name]'s comments for patient to call back. 

## 2017-04-09 ENCOUNTER — Other Ambulatory Visit: Payer: Self-pay | Admitting: Primary Care

## 2017-04-09 DIAGNOSIS — I10 Essential (primary) hypertension: Secondary | ICD-10-CM

## 2017-10-08 ENCOUNTER — Other Ambulatory Visit: Payer: Self-pay | Admitting: Primary Care

## 2017-10-08 DIAGNOSIS — I10 Essential (primary) hypertension: Secondary | ICD-10-CM

## 2017-11-05 ENCOUNTER — Encounter: Payer: Self-pay | Admitting: Primary Care

## 2017-11-05 ENCOUNTER — Ambulatory Visit (INDEPENDENT_AMBULATORY_CARE_PROVIDER_SITE_OTHER): Payer: Managed Care, Other (non HMO) | Admitting: Primary Care

## 2017-11-05 DIAGNOSIS — I10 Essential (primary) hypertension: Secondary | ICD-10-CM | POA: Diagnosis not present

## 2017-11-05 LAB — COMPREHENSIVE METABOLIC PANEL
ALBUMIN: 3.8 g/dL (ref 3.5–5.2)
ALK PHOS: 95 U/L (ref 39–117)
ALT: 14 U/L (ref 0–35)
AST: 15 U/L (ref 0–37)
BILIRUBIN TOTAL: 0.2 mg/dL (ref 0.2–1.2)
BUN: 9 mg/dL (ref 6–23)
CALCIUM: 9.4 mg/dL (ref 8.4–10.5)
CO2: 28 mEq/L (ref 19–32)
Chloride: 101 mEq/L (ref 96–112)
Creatinine, Ser: 0.61 mg/dL (ref 0.40–1.20)
GFR: 107.75 mL/min (ref >60.00)
Glucose, Bld: 93 mg/dL (ref 70–99)
POTASSIUM: 4.1 meq/L (ref 3.5–5.1)
Sodium: 135 mEq/L (ref 135–145)
TOTAL PROTEIN: 7.4 g/dL (ref 6.0–8.3)

## 2017-11-05 MED ORDER — AMLODIPINE BESYLATE 10 MG PO TABS: 10.0000 mg | ORAL_TABLET | Freq: Every day | ORAL | 3 refills | Status: DC

## 2017-11-05 MED ORDER — LISINOPRIL 20 MG PO TABS: 20.0000 mg | ORAL_TABLET | Freq: Every day | ORAL | 3 refills | Status: DC

## 2017-11-05 NOTE — Assessment & Plan Note (Signed)
Slightly above goal in the office, endorses normal home readings. Will have her continue to monitor BP and report readings at or above 135/90. BMP pending.

## 2017-11-05 NOTE — Patient Instructions (Addendum)
Continue taking Lisinopril and Amlodipine once daily for high blood pressure.  Stop by the lab prior to leaving today. I will notify you of your results once received.   Please bring me a copy of your lab results.  Continue monitoring your blood pressure and notify me if you see readings at or above 135/90 on a consistent basis.   It was a pleasure to see you today!

## 2017-11-05 NOTE — Progress Notes (Signed)
Subjective:    Patient ID: Denise [name], female    DOB: 03/02/1961, 57 y.o.   MRN: [id number]  HPI  Denise [name] is a 57 year old female who presents today for follow up of hypertension. She is overdue for CPE but would like to defer this until later this year. She does have routine lab work completed annually and is due next week. She will bring us a copy of those labs.   She is currently managed on amlodipine 10 mg and lisinopril 20 mg. She is feeling anxious today and does typically during office visits. She's checking her BP at home which runs 130's/80's. She denies chest pain, dizziness, headaches.   BP Readings from Last 3 Encounters:  11/05/17 140/70  10/10/16 (!) 144/80  06/11/16 (!) 142/78     Review of Systems  Constitutional: Negative for fatigue.  Respiratory: Negative for cough and shortness of breath.   Cardiovascular: Negative for chest pain.  Neurological: Negative for dizziness and headaches.       Past Medical History:  Diagnosis Date  . Allergy   . BMI 35.0-35.9,adult 03/31/2012  . Chickenpox   . Cholecystitis with cholelithiasis 03/31/2012  . Heart murmur   . Hypertension 03/31/2012  . Shingles      Social History   Socioeconomic History  . Marital status: Widowed    Spouse name: Not on file  . Number of children: Not on file  . Years of education: Not on file  . Highest education level: Not on file  Occupational History  . Not on file  Social Needs  . Financial resource strain: Not on file  . Food insecurity:    Worry: Not on file    Inability: Not on file  . Transportation needs:    Medical: Not on file    Non-medical: Not on file  Tobacco Use  . Smoking status: Current Every Day Smoker    Packs/day: 0.75    Years: 34.00    Pack years: 25.50    Types: Cigarettes  . Smokeless tobacco: Never Used  Substance and Sexual Activity  . Alcohol use: No  . Drug use: No  . Sexual activity: Never    Birth control/protection: None,  Post-menopausal  Lifestyle  . Physical activity:    Days per week: Not on file    Minutes per session: Not on file  . Stress: Not on file  Relationships  . Social connections:    Talks on phone: Not on file    Gets together: Not on file    Attends religious service: Not on file    Active member of club or organization: Not on file    Attends meetings of clubs or organizations: Not on file    Relationship status: Not on file  . Intimate partner violence:    Fear of current or ex partner: Not on file    Emotionally abused: Not on file    Physically abused: Not on file    Forced sexual activity: Not on file  Other Topics Concern  . Not on file  Social History Narrative   Widow.    2 children.   Works in Clinical biochemistCustomer Service.   Enjoys reading, cooking.     Past Surgical History:  Procedure Laterality Date  . CHOLECYSTECTOMY  03/30/2012   Procedure: LAPAROSCOPIC CHOLECYSTECTOMY WITH INTRAOPERATIVE CHOLANGIOGRAM;  Surgeon: Mariella SaaBenjamin T Hoxworth, MD;  Location: [facility];  Service: General;  Laterality: N/A;  . TUBAL LIGATION  Family History  Problem Relation Age of Onset  . Arthritis Mother   . Cervical cancer Mother   . Heart disease Father   . Hypertension Father     Allergies  Allergen Reactions  . Codeine Itching    No current outpatient medications on file prior to visit.   No current facility-administered medications on file prior to visit.     BP 140/70   Pulse 85   Temp 97.7 F (36.5 C) (Oral)   Ht 5' 4.5" (1.638 m)   Wt 190 lb 8 oz (86.4 kg)   SpO2 98%   BMI 32.19 kg/m    Objective:   Physical Exam  Constitutional: She appears well-nourished.  Neck: Neck supple.  Cardiovascular: Normal rate and regular rhythm.  Pulmonary/Chest: Effort normal and breath sounds normal.  Skin: Skin is warm and dry.  Psychiatric: She has a normal mood and affect.          Assessment & Plan:

## 2017-11-06 ENCOUNTER — Encounter: Payer: Self-pay | Admitting: *Deleted

## 2018-10-27 ENCOUNTER — Telehealth: Payer: Self-pay | Admitting: Primary Care

## 2018-10-27 DIAGNOSIS — I10 Essential (primary) hypertension: Secondary | ICD-10-CM

## 2018-10-27 MED ORDER — AMLODIPINE BESYLATE 10 MG PO TABS
10.0000 mg | ORAL_TABLET | Freq: Every day | ORAL | 0 refills | Status: DC
Start: 2018-10-27 — End: 2019-01-20

## 2018-10-27 MED ORDER — LISINOPRIL 20 MG PO TABS: 20.0000 mg | ORAL_TABLET | Freq: Every day | ORAL | 0 refills | Status: DC

## 2018-10-27 NOTE — Telephone Encounter (Signed)
Are you ok with [place] doing her refills she as an appointment scheduled for 6/17

## 2018-10-27 NOTE — Telephone Encounter (Signed)
Best number [phone] Pt schedule med refill appointment 6/17   She has 3 days left Needs refill on  Lisinopril Amlodipine  [facility]

## 2018-10-27 NOTE — Telephone Encounter (Signed)
Noted, refills sent to pharmacy. 

## 2019-01-07 ENCOUNTER — Encounter: Payer: Self-pay | Admitting: Primary Care

## 2019-01-07 ENCOUNTER — Ambulatory Visit (INDEPENDENT_AMBULATORY_CARE_PROVIDER_SITE_OTHER): Payer: Managed Care, Other (non HMO) | Admitting: Primary Care

## 2019-01-07 DIAGNOSIS — J019 Acute sinusitis, unspecified: Secondary | ICD-10-CM | POA: Diagnosis not present

## 2019-01-07 DIAGNOSIS — B9689 Other specified bacterial agents as the cause of diseases classified elsewhere: Secondary | ICD-10-CM | POA: Insufficient documentation

## 2019-01-07 MED ORDER — AMOXICILLIN-POT CLAVULANATE 875-125 MG PO TABS: 1.0000 | ORAL_TABLET | Freq: Two times a day (BID) | ORAL | 0 refills | Status: DC

## 2019-01-07 NOTE — Patient Instructions (Signed)
Start Augmentin antibiotics for the infection Take 1 tablet by mouth twice daily for 10 days.  It was a pleasure to see you today! Mayra Reel, NP-C

## 2019-01-07 NOTE — Assessment & Plan Note (Signed)
Symptoms more suggestive of acute sinusitis, there is a small chance for Covid-19. Given duration of symptoms, coupled with HPI will treat for presumed bacterial sinusitis.  Rx for Augmentin course sent to pharmacy. Follow up PRN.

## 2019-01-07 NOTE — Progress Notes (Signed)
Subjective:    Patient ID: Denise PealsSandra H [name], female    DOB: 06/29/1961, 58 y.o.   MRN: [id number]  HPI  Virtual Visit via Video Note  I connected with Denise [name] on 01/07/19 at  3:20 PM EDT by a video enabled telemedicine application and verified that I am speaking with the correct person using two identifiers.  Location: Patient: Home Provider: Office   I discussed the limitations of evaluation and management by telemedicine and the availability of in person appointments. The patient expressed understanding and agreed to proceed.  History of Present Illness:  Ms. Denise [name] is a 58 year old female who presents today with a chief complaint of sinus pressure.   She also reports nasal congestion, post nasal drip, and cough. Her symptoms began about 3 weeks ago and now have progressed. She is blowing thick green mucous from her nasal cavity. She denies exposure to Covid-19 and has been at home most of the time. She denies shortness of breath, loss of taste/smell. She has a history of sinusitis and these symptoms feel very similar.    Observations/Objective:  Alert and oriented. Appears well, not sickly. No distress. Speaking in complete sentences.   Assessment and Plan:  Symptoms more suggestive of acute sinusitis, there is a small chance for Covid-19. Given duration of symptoms, coupled with HPI will treat for presumed bacterial sinusitis.  Rx for Augmentin course sent to pharmacy. Follow up PRN.  Follow Up Instructions:  Start Augmentin antibiotics for the infection Take 1 tablet by mouth twice daily for 10 days.  It was a pleasure to see you today! Mayra ReelKate Haiven Nardone, NP-C    I discussed the assessment and treatment plan with the patient. The patient was provided an opportunity to ask questions and all were answered. The patient agreed with the plan and demonstrated an understanding of the instructions.   The patient was advised to call back or seek an in-person evaluation if the  symptoms worsen or if the condition fails to improve as anticipated.     Doreene NestKatherine K Reghan Thul, NP    Review of Systems  Constitutional: Negative for chills, fatigue and fever.  HENT: Positive for congestion, postnasal drip, sinus pressure and sinus pain. Negative for sore throat.   Respiratory: Positive for cough. Negative for shortness of breath.   Cardiovascular: Negative for chest pain.  Neurological: Positive for headaches. Negative for dizziness.       Past Medical History:  Diagnosis Date  . Allergy   . BMI 35.0-35.9,adult 03/31/2012  . Chickenpox   . Cholecystitis with cholelithiasis 03/31/2012  . Heart murmur   . Hypertension 03/31/2012  . Shingles      Social History   Socioeconomic History  . Marital status: Widowed    Spouse name: Not on file  . Number of children: Not on file  . Years of education: Not on file  . Highest education level: Not on file  Occupational History  . Not on file  Social Needs  . Financial resource strain: Not on file  . Food insecurity:    Worry: Not on file    Inability: Not on file  . Transportation needs:    Medical: Not on file    Non-medical: Not on file  Tobacco Use  . Smoking status: Current Every Day Smoker    Packs/day: 0.75    Years: 34.00    Pack years: 25.50    Types: Cigarettes  . Smokeless tobacco: Never Used  Substance and Sexual  Activity  . Alcohol use: No  . Drug use: No  . Sexual activity: Never    Birth control/protection: None, Post-menopausal  Lifestyle  . Physical activity:    Days per week: Not on file    Minutes per session: Not on file  . Stress: Not on file  Relationships  . Social connections:    Talks on phone: Not on file    Gets together: Not on file    Attends religious service: Not on file    Active member of club or organization: Not on file    Attends meetings of clubs or organizations: Not on file    Relationship status: Not on file  . Intimate partner violence:    Fear of current  or ex partner: Not on file    Emotionally abused: Not on file    Physically abused: Not on file    Forced sexual activity: Not on file  Other Topics Concern  . Not on file  Social History Narrative   Widow.    2 children.   Works in Clinical biochemist.   Enjoys reading, cooking.     Past Surgical History:  Procedure Laterality Date  . CHOLECYSTECTOMY  03/30/2012   Procedure: LAPAROSCOPIC CHOLECYSTECTOMY WITH INTRAOPERATIVE CHOLANGIOGRAM;  Surgeon: Mariella Saa, MD;  Location: [facility];  Service: General;  Laterality: N/A;  . TUBAL LIGATION      Family History  Problem Relation Age of Onset  . Arthritis Mother   . Cervical cancer Mother   . Heart disease Father   . Hypertension Father     Allergies  Allergen Reactions  . Codeine Itching    Current Outpatient Medications on File Prior to Visit  Medication Sig Dispense Refill  . amLODipine (NORVASC) 10 MG tablet Take 1 tablet (10 mg total) by mouth daily. For blood pressure. 90 tablet 0  . lisinopril (PRINIVIL,ZESTRIL) 20 MG tablet Take 1 tablet (20 mg total) by mouth daily. For blood pressure. 90 tablet 0   No current facility-administered medications on file prior to visit.     Temp 99 F (37.2 C) (Oral)   Wt 190 lb (86.2 kg)   BMI 32.11 kg/m    Objective:   Physical Exam  Constitutional: She is oriented to person, place, and time. She appears well-nourished. She does not have a sickly appearance. She does not appear ill.  Respiratory: Effort normal.  Neurological: She is oriented to person, place, and time.  Psychiatric: She has a normal mood and affect.           Assessment & Plan:

## 2019-01-18 ENCOUNTER — Other Ambulatory Visit: Payer: Self-pay | Admitting: Primary Care

## 2019-01-18 DIAGNOSIS — I10 Essential (primary) hypertension: Secondary | ICD-10-CM

## 2019-01-19 ENCOUNTER — Other Ambulatory Visit: Payer: Self-pay | Admitting: Primary Care

## 2019-01-19 DIAGNOSIS — I10 Essential (primary) hypertension: Secondary | ICD-10-CM

## 2019-01-21 ENCOUNTER — Encounter: Payer: Self-pay | Admitting: Primary Care

## 2019-01-21 ENCOUNTER — Other Ambulatory Visit: Payer: Self-pay

## 2019-01-21 ENCOUNTER — Ambulatory Visit (INDEPENDENT_AMBULATORY_CARE_PROVIDER_SITE_OTHER): Payer: Managed Care, Other (non HMO) | Admitting: Primary Care

## 2019-01-21 DIAGNOSIS — I10 Essential (primary) hypertension: Secondary | ICD-10-CM | POA: Diagnosis not present

## 2019-01-21 MED ORDER — OLMESARTAN MEDOXOMIL 20 MG PO TABS: 20.0000 mg | ORAL_TABLET | Freq: Every day | ORAL | 0 refills | Status: DC

## 2019-01-21 NOTE — Progress Notes (Signed)
Subjective:    Patient ID: Denise [name], female    DOB: 08/11/1960, 58 y.o.   MRN: [id number]  HPI  Denise [name] is a 58 year old female who presents today for follow up of hypertension.  She was last evaluated for hypertension on 11/05/17, BP slightly above goal in the office but she endorsed normal home readings. We decided to continue her lisinopril 20 mg and Amlodipine 10 mg and have her monitor home readings.  BP Readings from Last 3 Encounters:  01/21/19 120/68  11/05/17 140/70  10/10/16 (!) 144/80   Since her last visit she's doing well. She does have a chronic cough from smoking, but also experiences a dry tickle to her throat which causes a coughing spell. This occurs often. She is not monitor her BP at home as her machine broke. She plans on getting a new machine. She denies dizziness, chest pain.        Review of Systems  Eyes: Negative for visual disturbance.  Respiratory: Positive for cough. Negative for shortness of breath.   Cardiovascular: Negative for chest pain.  Neurological: Negative for dizziness and headaches.       Past Medical History:  Diagnosis Date  . Allergy   . BMI 35.0-35.9,adult 03/31/2012  . Chickenpox   . Cholecystitis with cholelithiasis 03/31/2012  . Heart murmur   . Hypertension 03/31/2012  . Shingles      Social History   Socioeconomic History  . Marital status: Widowed    Spouse name: Not on file  . Number of children: Not on file  . Years of education: Not on file  . Highest education level: Not on file  Occupational History  . Not on file  Social Needs  . Financial resource strain: Not on file  . Food insecurity    Worry: Not on file    Inability: Not on file  . Transportation needs    Medical: Not on file    Non-medical: Not on file  Tobacco Use  . Smoking status: Current Every Day Smoker    Packs/day: 0.75    Years: 34.00    Pack years: 25.50    Types: Cigarettes  . Smokeless tobacco: Never Used  Substance and Sexual  Activity  . Alcohol use: No  . Drug use: No  . Sexual activity: Never    Birth control/protection: None, Post-menopausal  Lifestyle  . Physical activity    Days per week: Not on file    Minutes per session: Not on file  . Stress: Not on file  Relationships  . Social Musicianconnections    Talks on phone: Not on file    Gets together: Not on file    Attends religious service: Not on file    Active member of club or organization: Not on file    Attends meetings of clubs or organizations: Not on file    Relationship status: Not on file  . Intimate partner violence    Fear of current or ex partner: Not on file    Emotionally abused: Not on file    Physically abused: Not on file    Forced sexual activity: Not on file  Other Topics Concern  . Not on file  Social History Narrative   Widow.    2 children.   Works in Clinical biochemistCustomer Service.   Enjoys reading, cooking.     Past Surgical History:  Procedure Laterality Date  . CHOLECYSTECTOMY  03/30/2012   Procedure: LAPAROSCOPIC CHOLECYSTECTOMY WITH INTRAOPERATIVE CHOLANGIOGRAM;  Surgeon: Edward Jolly, MD;  Location: [facility];  Service: General;  Laterality: N/A;  . TUBAL LIGATION      Family History  Problem Relation Age of Onset  . Arthritis Mother   . Cervical cancer Mother   . Heart disease Father   . Hypertension Father     Allergies  Allergen Reactions  . Codeine Itching    Current Outpatient Medications on File Prior to Visit  Medication Sig Dispense Refill  . amLODipine (NORVASC) 10 MG tablet TAKE 1 TABLET BY MOUTH EVERY DAY FOR BLOOD PRESSURE 90 tablet 0   No current facility-administered medications on file prior to visit.     BP 120/68   Pulse 90   Temp 98.6 F (37 C) (Tympanic)   Wt 188 lb 12 oz (85.6 kg)   SpO2 97%   BMI 31.90 kg/m    Objective:   Physical Exam  Constitutional: She appears well-nourished.  Neck: Neck supple.  Cardiovascular: Normal rate and regular rhythm.  Respiratory: Effort normal  and breath sounds normal.  Skin: Skin is warm and dry.  Psychiatric: She has a normal mood and affect.           Assessment & Plan:

## 2019-01-21 NOTE — Assessment & Plan Note (Signed)
Stable in the office today on current regimen but suspect ACE induced cough. She does also smoke which could be contributing.   Continue Amlodipine 10 mg, change lisinopril to olmesartan 20 mg. We will have her start monitoring her BP daily for the next several weeks. She will notify if she sees readings at or above 135/90.  CMP pending. She will also update regarding her cough.

## 2019-01-21 NOTE — Patient Instructions (Signed)
Stop lisinopril 20 mg, start olmesartan 20 mg for blood pressure. Take 1 tablet daily.  Continue Amlodipine 10 mg for blood pressure.  Start monitoring your blood pressure daily, around the same time of day, for the next 2-3 weeks.  Ensure that you have rested for 30 minutes prior to checking your blood pressure. Record your readings and notify me if you see readings that are consistently at or above 135/90.  Stop by the lab prior to leaving today. I will notify you of your results once received.   It was a pleasure to see you today!

## 2019-01-22 ENCOUNTER — Encounter: Payer: Self-pay | Admitting: *Deleted

## 2019-01-22 LAB — COMPREHENSIVE METABOLIC PANEL
ALT: 10 U/L (ref 0–35)
AST: 15 U/L (ref 0–37)
Albumin: 3.8 g/dL (ref 3.5–5.2)
Alkaline Phosphatase: 83 U/L (ref 39–117)
BUN: 11 mg/dL (ref 6–23)
CO2: 27 mEq/L (ref 19–32)
Calcium: 9.2 mg/dL (ref 8.4–10.5)
Chloride: 101 mEq/L (ref 96–112)
Creatinine, Ser: 0.75 mg/dL (ref 0.40–1.20)
GFR: 79.52 mL/min (ref >60.00)
Glucose, Bld: 98 mg/dL (ref 70–99)
Potassium: 3.8 mEq/L (ref 3.5–5.1)
Sodium: 135 mEq/L (ref 135–145)
Total Bilirubin: 0.2 mg/dL (ref 0.2–1.2)
Total Protein: 6.6 g/dL (ref 6.0–8.3)

## 2019-01-22 LAB — LIPID PANEL
Cholesterol: 174 mg/dL (ref 0–200)
HDL: 34.6 mg/dL — ABNORMAL LOW (ref >39.00)
LDL Cholesterol: 108 mg/dL — ABNORMAL HIGH (ref 0–99)
NonHDL: 139.82
Total CHOL/HDL Ratio: 5
Triglycerides: 158 mg/dL — ABNORMAL HIGH (ref 0.0–149.0)
VLDL: 31.6 mg/dL (ref 0.0–40.0)

## 2019-02-04 ENCOUNTER — Telehealth: Payer: Self-pay | Admitting: *Deleted

## 2019-02-04 DIAGNOSIS — I10 Essential (primary) hypertension: Secondary | ICD-10-CM

## 2019-02-04 MED ORDER — LISINOPRIL 20 MG PO TABS: 20.0000 mg | ORAL_TABLET | Freq: Every day | ORAL | 3 refills | Status: DC

## 2019-02-04 NOTE — Telephone Encounter (Signed)
Message left for patient to return my call.  

## 2019-02-04 NOTE — Telephone Encounter (Signed)
Patient left a voicemail stating that she was recently switched to a new blood pressure medication. Patient stated that the new medication caused her to have a terrible cough and she felt like she had Covid. Patient stated that she switched back to her Lisinopril and is doing fine with that. Patient requested that a script be sent in for her Lisinopril. [facility]acy [facility]

## 2019-02-04 NOTE — Telephone Encounter (Signed)
Will have her stop olmesartan and resume lisinopril 20 mg. Please have her update me if her dry tickle cough returns. There are other options for BP treatment.  Rx for lisinopril sent to pharmacy.

## 2019-02-05 NOTE — Telephone Encounter (Signed)
Spoken and notified patient of [name] Clark's comments. Patient verbalized understanding.  

## 2019-04-17 ENCOUNTER — Other Ambulatory Visit: Payer: Self-pay | Admitting: Primary Care

## 2019-04-17 DIAGNOSIS — I10 Essential (primary) hypertension: Secondary | ICD-10-CM

## 2019-05-01 ENCOUNTER — Telehealth: Payer: Self-pay | Admitting: Primary Care

## 2019-05-01 NOTE — Telephone Encounter (Signed)
Patient stated she only revieved a refill of 90 days for the amLODipine and normally she gets enough refills for the year. Patient only has enough tablets to get her to Monday and would like to see if Refills could be sent    [facility] [address] [employer]

## 2019-05-01 NOTE — Telephone Encounter (Signed)
Message left for patient to return my call.  Not sure what going on amLODipine 10 mg was sent on 04/17/2019 with 90 days and 1 refill as request from the pharmacy.

## 2019-08-07 DIAGNOSIS — Z9289 Personal history of other medical treatment: Secondary | ICD-10-CM

## 2019-08-07 HISTORY — PX: CATARACT EXTRACTION: SUR2

## 2019-08-07 HISTORY — DX: Personal history of other medical treatment: Z92.89

## 2019-08-31 ENCOUNTER — Encounter: Payer: Self-pay | Admitting: Family Medicine

## 2019-08-31 ENCOUNTER — Ambulatory Visit (INDEPENDENT_AMBULATORY_CARE_PROVIDER_SITE_OTHER): Payer: Managed Care, Other (non HMO) | Admitting: Family Medicine

## 2019-08-31 ENCOUNTER — Telehealth: Payer: Self-pay

## 2019-08-31 DIAGNOSIS — J0110 Acute frontal sinusitis, unspecified: Secondary | ICD-10-CM

## 2019-08-31 DIAGNOSIS — Z87891 Personal history of nicotine dependence: Secondary | ICD-10-CM | POA: Diagnosis not present

## 2019-08-31 DIAGNOSIS — J019 Acute sinusitis, unspecified: Secondary | ICD-10-CM | POA: Insufficient documentation

## 2019-08-31 MED ORDER — AMOXICILLIN-POT CLAVULANATE 875-125 MG PO TABS: 1.0000 | ORAL_TABLET | Freq: Two times a day (BID) | ORAL | 0 refills | Status: DC

## 2019-08-31 NOTE — Progress Notes (Signed)
Virtual Visit via Video Note  I connected with Denise [name] on 08/31/19 at  9:00 AM EST by a video enabled telemedicine application and verified that I am speaking with the correct person using two identifiers.  Location: Patient: home Provider: office   I discussed the limitations of evaluation and management by telemedicine and the availability of in person appointments. The patient expressed understanding and agreed to proceed.  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 Denise [name] presents with sinus symptoms  She is a current smoker  She gets sinus infections about twice per year   Symptoms for 2 1/2 weeks  Did not get tested for covid   Has pnd  Blows out yellow thick nasal d/c Very congested Makes her cough- it is mildly productive/phlegm Throat hurts/raw/sctratchy to cough   No wheezing or sob   Past Smoker (quit in October) no copd or lung dz    Pain in face-worst above eyes with pressure Both sides   No fever / no chills or aches   No loss of taste or smell  No GI symptoms   Otc: takes some dayquil  She has used saline nasal spray - a little helpful   Patient Active Problem List   Diagnosis Date Noted  . Acute sinusitis 08/31/2019  . Hypertension 03/31/2012   Past Medical History:  Diagnosis Date  . Allergy   . BMI 35.0-35.9,adult 03/31/2012  . Chickenpox   . Cholecystitis with cholelithiasis 03/31/2012  . Heart murmur   . Hypertension 03/31/2012  . Shingles    Past Surgical History:  Procedure Laterality Date  . CHOLECYSTECTOMY  03/30/2012   Procedure: LAPAROSCOPIC CHOLECYSTECTOMY WITH INTRAOPERATIVE CHOLANGIOGRAM;  Surgeon: Mariella Saa, MD;  Location: [facility];  Service: General;  Laterality: N/A;  . TUBAL LIGATION     Social History   Tobacco Use  . Smoking status: Former Smoker    Packs/day: 0.75    Years: 34.00    Pack years: 25.50    Types:  Cigarettes    Quit date: 05/07/2019    Years since quitting: 0.3  . Smokeless tobacco: Never Used  Substance Use Topics  . Alcohol use: No  . Drug use: No   Family History  Problem Relation Age of Onset  . Arthritis Mother   . Cervical cancer Mother   . Heart disease Father   . Hypertension Father    Allergies  Allergen Reactions  . Codeine Itching   Current Outpatient Medications on File Prior to Visit  Medication Sig Dispense Refill  . amLODipine (NORVASC) 10 MG tablet TAKE 1 TABLET BY MOUTH EVERY DAY FOR BLOOD PRESSURE 90 tablet 1  . lisinopril (ZESTRIL) 20 MG tablet Take 1 tablet (20 mg total) by mouth daily. For blood pressure. 90 tablet 3   No current facility-administered medications on file prior to visit.     Review of Systems  Constitutional: Negative for chills, fever and malaise/fatigue.  HENT: Positive for congestion and sinus pain. Negative for ear pain and sore throat.   Eyes: Negative for blurred vision, discharge and redness.  Respiratory: Positive for cough and sputum production. Negative for shortness of breath, wheezing and stridor.   Cardiovascular: Negative for chest pain, palpitations and leg swelling.  Gastrointestinal: Negative for abdominal pain, diarrhea, nausea and vomiting.  Musculoskeletal: Negative for myalgias.  Skin: Negative for rash.  Neurological: Positive for headaches. Negative  for dizziness.    Observations/Objective: Patient appears well, in no distress Weight is baseline  No facial swelling or asymmetry Some nasal congestion /nasal voice Normal voice-not hoarse and no slurred speech No obvious tremor or mobility impairment Moving neck and UEs normally Able to hear the call well  No cough or shortness of breath during interview  Talkative and mentally sharp with no cognitive changes No skin changes on face or neck , no rash or pallor Affect is normal    Assessment and Plan: Problem List Items Addressed This Visit       Respiratory   Acute sinusitis    S/p 2 1/2 weeks of uri symptoms (no fever) and did not get tested for covid Px augmentin bid for 7d Fluids Nasal saline  Can try steroid ns otc if needed as well  Steam/ warm compresses on face Update if not starting to improve in a week or if worsening   Also update if any new symptoms like fever      Relevant Medications   amoxicillin-clavulanate (AUGMENTIN) 875-125 MG tablet       Follow Up Instructions: Take augmentin as directed Drink fluids Breathe steam  Nasal saline is helpful  Also warm compresses on sinuses  Update if not starting to improve in a week or if worsening   Also if any new symptoms like fever   I discussed the assessment and treatment plan with the patient. The patient was provided an opportunity to ask questions and all were answered. The patient agreed with the plan and demonstrated an understanding of the instructions.   The patient was advised to call back or seek an in-person evaluation if the symptoms worsen or if the condition fails to improve as anticipated.     Loura Pardon, MD

## 2019-08-31 NOTE — Assessment & Plan Note (Signed)
Quit 10/20  Commended!  Doing well

## 2019-08-31 NOTE — Telephone Encounter (Signed)
Per appt notes pt has virtual appt today at 9 AM with Dr Milinda Antis.

## 2019-08-31 NOTE — Patient Instructions (Signed)
Take augmentin as directed Drink fluids Breathe steam  Nasal saline is helpful  Also warm compresses on sinuses  Update if not starting to improve in a week or if worsening   Also if any new symptoms like fever

## 2019-08-31 NOTE — Telephone Encounter (Signed)
[facility] Night - Client TELEPHONE ADVICE RECORD [facility] Patient Name: Denise [name] Gender: Female DOB: 1960-09-10 Age: 59 Y 65 D Return Phone Number: [phone] (Primary) Address: City/State/Zip: [place] [address] Client [facility] - Client Client Site [place] Physician Alma Friendly - NP Contact Type Call Who Is Calling Patient / Member / Family / Caregiver Call Type Triage / Clinical Relationship To Patient Self Return Phone Number [phone] (Primary) Chief Complaint Nasal Congestion Reason for Call Symptomatic / Request for [place] states she has been experiencing sinus problems. Translation No Nurse Assessment Nurse: Carlena Bjornstad, RN, Arsenio Katz Date/Time Eilene Ghazi Time): 08/30/2019 11:48:22 AM Confirm and document reason for call. If symptomatic, describe symptoms. ---Caller states she has been experiencing sinus problems. Sinus pressure. Eyes ache. Sore throat. Cough. Has the patient had close contact with a person known or suspected to have the novel coronavirus illness OR traveled / lives in area with major community spread (including international travel) in the last 14 days from the onset of symptoms? * If Asymptomatic, screen for exposure and travel within the last 14 days. ---No Does the patient have any new or worsening symptoms? ---Yes Will a triage be completed? ---Yes Related visit to physician within the last 2 weeks? ---No Does the PT have any chronic conditions? (i.e. diabetes, asthma, this includes High risk factors for pregnancy, etc.) ---Yes List chronic conditions. ---htn Is this a behavioral health or substance abuse call? ---No Guidelines Guideline Title Affirmed Question Affirmed Notes Nurse Date/Time Eilene Ghazi Time) Sinus Pain or Congestion [1] Sinus congestion (pressure, fullness) AND [2] present > 10 days Carlena Bjornstad,  RN, Verdis Frederickson Angelica 3/54/6568 12:75:17 AM Disp. Time Eilene Ghazi Time) Disposition Final User 08/30/2019 11:57:55 AM SEE PCP WITHIN 3 DAYS Yes Carlena Bjornstad, RN, Verdis Frederickson Angelica PLEASE NOTE: All timestamps contained within this report are represented as [place] Standard Time. CONFIDENTIALTY NOTICE: This fax transmission is intended only for the addressee. It contains information that is legally privileged, confidential or otherwise protected from use or disclosure. If you are not the intended recipient, you are strictly prohibited from reviewing, disclosing, copying using or disseminating any of this information or taking any action in reliance on or regarding this information. If you have received this fax in error, please notify us immediately by telephone so that we can arrange for its return to [place]. Phone: [phone], Toll-Free: [phone], Fax: [phone] Page: 2 of 2 Call Id: [id number] [place] Disagree/Comply Comply Caller Understands Yes PreDisposition Call Doctor Care Advice Given Per Guideline SEE PCP WITHIN 3 DAYS: * You need to be seen within 2 or 3 days. Call your doctor (or NP/PA) during regular office hours and make an appointment. A clinic or urgent care center are good places to go for care if your doctor's office is closed or you can't get an appointment. NOTE: If office will be open tomorrow, tell caller to call then, not in 3 days. NASAL WASHES FOR A STUFFY NOSE: * Introduction: Saline (salt water) nasal irrigation (nasal wash) is an effective and simple home remedy for treating stuffy nose and sinus congestion. The nose can be irrigated by pouring, spraying, or squirting salt water into the nose and then letting it run back out. * How it Helps: The salt water rinses out excess mucus and washes out any irritants (dust, allergens) that might be present. It also moistens the nasal cavity. PAIN MEDICINES: * For pain relief, you can take either  acetaminophen, ibuprofen, or naproxen. *  ACETAMINOPHEN - REGULAR STRENGTH TYLENOL: Take 650 mg (two 325 mg pills) by mouth every 4 to 6 hours as needed. Each Regular Strength Tylenol pill has 325 mg of acetaminophen. The most you should take each day is 3,250 mg (10 pills a day). CALL BACK IF: * Difficulty breathing (and not relieved by cleaning out nose) * You become worse. CARE ADVICE given per Sinus Pain or Congestion (Adult) guideline. Referrals GO TO FACILITY UNDECIDED

## 2019-08-31 NOTE — Assessment & Plan Note (Signed)
S/p 2 1/2 weeks of uri symptoms (no fever) and did not get tested for covid Px augmentin bid for 7d Fluids Nasal saline  Can try steroid ns otc if needed as well  Steam/ warm compresses on face Update if not starting to improve in a week or if worsening   Also update if any new symptoms like fever

## 2019-09-17 NOTE — Telephone Encounter (Signed)
Pt had visit with Dr Milinda Antis on 08/31/19.

## 2019-10-26 ENCOUNTER — Other Ambulatory Visit: Payer: Self-pay | Admitting: Primary Care

## 2019-10-26 DIAGNOSIS — I10 Essential (primary) hypertension: Secondary | ICD-10-CM

## 2019-11-02 ENCOUNTER — Other Ambulatory Visit: Payer: Self-pay | Admitting: Primary Care

## 2019-11-02 DIAGNOSIS — I10 Essential (primary) hypertension: Secondary | ICD-10-CM

## 2019-12-22 ENCOUNTER — Ambulatory Visit (INDEPENDENT_AMBULATORY_CARE_PROVIDER_SITE_OTHER): Payer: 59 | Admitting: Primary Care

## 2019-12-22 ENCOUNTER — Other Ambulatory Visit: Payer: Self-pay

## 2019-12-22 ENCOUNTER — Encounter (HOSPITAL_COMMUNITY): Payer: Self-pay

## 2019-12-22 ENCOUNTER — Encounter: Payer: Self-pay | Admitting: Primary Care

## 2019-12-22 ENCOUNTER — Inpatient Hospital Stay (HOSPITAL_COMMUNITY)
Admission: EM | Admit: 2019-12-22 | Discharge: 2019-12-24 | DRG: 377 | Disposition: A | Discharge status: Home / Self Care | Payer: 59 | Attending: Internal Medicine | Admitting: Internal Medicine

## 2019-12-22 ENCOUNTER — Ambulatory Visit (INDEPENDENT_AMBULATORY_CARE_PROVIDER_SITE_OTHER)
Admission: RE | Admit: 2019-12-22 | Discharge: 2019-12-22 | Disposition: A | Discharge status: Home / Self Care | Payer: 59 | Source: Ambulatory Visit | Attending: Primary Care | Admitting: Primary Care

## 2019-12-22 ENCOUNTER — Telehealth: Payer: Self-pay

## 2019-12-22 VITALS — BP 120/66 | HR 106 | Temp 96.2°F | Ht 64.5 in | Wt 183.5 lb

## 2019-12-22 DIAGNOSIS — I119 Hypertensive heart disease without heart failure: Secondary | ICD-10-CM | POA: Diagnosis present

## 2019-12-22 DIAGNOSIS — R011 Cardiac murmur, unspecified: Secondary | ICD-10-CM

## 2019-12-22 DIAGNOSIS — I2510 Atherosclerotic heart disease of native coronary artery without angina pectoris: Secondary | ICD-10-CM | POA: Diagnosis present

## 2019-12-22 DIAGNOSIS — I700 Atherosclerosis of aorta: Secondary | ICD-10-CM | POA: Diagnosis present

## 2019-12-22 DIAGNOSIS — K5521 Angiodysplasia of colon with hemorrhage: Principal | ICD-10-CM | POA: Diagnosis present

## 2019-12-22 DIAGNOSIS — J189 Pneumonia, unspecified organism: Secondary | ICD-10-CM | POA: Diagnosis present

## 2019-12-22 DIAGNOSIS — R911 Solitary pulmonary nodule: Secondary | ICD-10-CM | POA: Diagnosis present

## 2019-12-22 DIAGNOSIS — E871 Hypo-osmolality and hyponatremia: Secondary | ICD-10-CM | POA: Diagnosis present

## 2019-12-22 DIAGNOSIS — Z1159 Encounter for screening for other viral diseases: Secondary | ICD-10-CM

## 2019-12-22 DIAGNOSIS — R0789 Other chest pain: Secondary | ICD-10-CM | POA: Diagnosis present

## 2019-12-22 DIAGNOSIS — K635 Polyp of colon: Secondary | ICD-10-CM | POA: Diagnosis present

## 2019-12-22 DIAGNOSIS — Z885 Allergy status to narcotic agent status: Secondary | ICD-10-CM

## 2019-12-22 DIAGNOSIS — Z8261 Family history of arthritis: Secondary | ICD-10-CM

## 2019-12-22 DIAGNOSIS — Z20822 Contact with and (suspected) exposure to covid-19: Secondary | ICD-10-CM | POA: Diagnosis present

## 2019-12-22 DIAGNOSIS — Z6830 Body mass index (BMI) 30.0-30.9, adult: Secondary | ICD-10-CM

## 2019-12-22 DIAGNOSIS — K645 Perianal venous thrombosis: Secondary | ICD-10-CM | POA: Diagnosis present

## 2019-12-22 DIAGNOSIS — D62 Acute posthemorrhagic anemia: Secondary | ICD-10-CM | POA: Diagnosis present

## 2019-12-22 DIAGNOSIS — I10 Essential (primary) hypertension: Secondary | ICD-10-CM | POA: Diagnosis not present

## 2019-12-22 DIAGNOSIS — Z720 Tobacco use: Secondary | ICD-10-CM | POA: Diagnosis present

## 2019-12-22 DIAGNOSIS — D649 Anemia, unspecified: Secondary | ICD-10-CM | POA: Diagnosis not present

## 2019-12-22 DIAGNOSIS — J45909 Unspecified asthma, uncomplicated: Secondary | ICD-10-CM | POA: Diagnosis present

## 2019-12-22 DIAGNOSIS — I517 Cardiomegaly: Secondary | ICD-10-CM | POA: Diagnosis not present

## 2019-12-22 DIAGNOSIS — R600 Localized edema: Secondary | ICD-10-CM

## 2019-12-22 DIAGNOSIS — R0600 Dyspnea, unspecified: Secondary | ICD-10-CM

## 2019-12-22 DIAGNOSIS — Z79899 Other long term (current) drug therapy: Secondary | ICD-10-CM

## 2019-12-22 DIAGNOSIS — I959 Hypotension, unspecified: Secondary | ICD-10-CM | POA: Diagnosis present

## 2019-12-22 DIAGNOSIS — F17210 Nicotine dependence, cigarettes, uncomplicated: Secondary | ICD-10-CM | POA: Diagnosis present

## 2019-12-22 DIAGNOSIS — Z7982 Long term (current) use of aspirin: Secondary | ICD-10-CM

## 2019-12-22 DIAGNOSIS — Z8249 Family history of ischemic heart disease and other diseases of the circulatory system: Secondary | ICD-10-CM

## 2019-12-22 DIAGNOSIS — J439 Emphysema, unspecified: Secondary | ICD-10-CM | POA: Diagnosis present

## 2019-12-22 DIAGNOSIS — K644 Residual hemorrhoidal skin tags: Secondary | ICD-10-CM | POA: Diagnosis present

## 2019-12-22 DIAGNOSIS — R0609 Other forms of dyspnea: Secondary | ICD-10-CM | POA: Insufficient documentation

## 2019-12-22 DIAGNOSIS — J449 Chronic obstructive pulmonary disease, unspecified: Secondary | ICD-10-CM | POA: Diagnosis present

## 2019-12-22 DIAGNOSIS — D692 Other nonthrombocytopenic purpura: Secondary | ICD-10-CM | POA: Diagnosis present

## 2019-12-22 DIAGNOSIS — R12 Heartburn: Secondary | ICD-10-CM | POA: Diagnosis present

## 2019-12-22 DIAGNOSIS — Z8049 Family history of malignant neoplasm of other genital organs: Secondary | ICD-10-CM

## 2019-12-22 DIAGNOSIS — E669 Obesity, unspecified: Secondary | ICD-10-CM | POA: Diagnosis present

## 2019-12-22 LAB — COMPREHENSIVE METABOLIC PANEL
ALT: 9 U/L (ref 0–35)
ALT: 12 U/L (ref 0–44)
AST: 15 U/L (ref 0–37)
AST: 19 U/L (ref 15–41)
Albumin: 3.8 g/dL (ref 3.5–5.2)
Albumin: 3.2 g/dL — ABNORMAL LOW (ref 3.5–5.0)
Alkaline Phosphatase: 72 U/L (ref 39–117)
Alkaline Phosphatase: 68 U/L (ref 38–126)
Anion gap: 11 (ref 5–15)
BUN: 6 mg/dL (ref 6–23)
BUN: 9 mg/dL (ref 6–20)
CO2: 22 mEq/L (ref 19–32)
CO2: 22 mmol/L (ref 22–32)
Calcium: 8.6 mg/dL (ref 8.4–10.5)
Calcium: 8.6 mg/dL — ABNORMAL LOW (ref 8.9–10.3)
Chloride: 96 mEq/L (ref 96–112)
Chloride: 95 mmol/L — ABNORMAL LOW (ref 98–111)
Creatinine, Ser: 0.63 mg/dL (ref 0.40–1.20)
Creatinine, Ser: 0.67 mg/dL (ref 0.44–1.00)
GFR calc Af Amer: >60 mL/min (ref >60)
GFR calc non Af Amer: >60 mL/min (ref >60)
GFR: 96.94 mL/min (ref >60.00)
Glucose, Bld: 104 mg/dL — ABNORMAL HIGH (ref 70–99)
Glucose, Bld: 109 mg/dL — ABNORMAL HIGH (ref 70–99)
Potassium: 4.7 mEq/L (ref 3.5–5.1)
Potassium: 4.5 mmol/L (ref 3.5–5.1)
Sodium: 125 mEq/L — ABNORMAL LOW (ref 135–145)
Sodium: 128 mmol/L — ABNORMAL LOW (ref 135–145)
Total Bilirubin: 0.3 mg/dL (ref 0.2–1.2)
Total Bilirubin: 0.2 mg/dL — ABNORMAL LOW (ref 0.3–1.2)
Total Protein: 6.7 g/dL (ref 6.0–8.3)
Total Protein: 6.2 g/dL — ABNORMAL LOW (ref 6.5–8.1)

## 2019-12-22 LAB — PROTIME-INR
INR: 1.1 (ref 0.8–1.2)
Prothrombin Time: 13.6 seconds (ref 11.4–15.2)

## 2019-12-22 LAB — CBC
HCT: 15.2 % — ABNORMAL LOW (ref 36.0–46.0)
Hemoglobin: 3.7 g/dL — CL (ref 12.0–15.0)
MCH: 15.6 pg — ABNORMAL LOW (ref 26.0–34.0)
MCHC: 24.3 g/dL — ABNORMAL LOW (ref 30.0–36.0)
MCV: 64.1 fL — ABNORMAL LOW (ref 80.0–100.0)
Platelets: 396 10*3/uL (ref 150–400)
RBC: 2.37 MIL/uL — ABNORMAL LOW (ref 3.87–5.11)
RDW: 21.3 % — ABNORMAL HIGH (ref 11.5–15.5)
WBC: 8.6 10*3/uL (ref 4.0–10.5)
nRBC: 0.5 % — ABNORMAL HIGH (ref 0.0–0.2)

## 2019-12-22 LAB — CBC WITH DIFFERENTIAL/PLATELET
Abs Immature Granulocytes: 0.04 10*3/uL (ref 0.00–0.07)
Basophils Absolute: 0.1 10*3/uL (ref 0.0–0.1)
Basophils Absolute: 0 10*3/uL (ref 0.0–0.1)
Basophils Relative: 0.8 % (ref 0.0–3.0)
Basophils Relative: 0 %
Eosinophils Absolute: 0 10*3/uL (ref 0.0–0.7)
Eosinophils Absolute: 0 10*3/uL (ref 0.0–0.5)
Eosinophils Relative: 0.4 % (ref 0.0–5.0)
Eosinophils Relative: 0 %
HCT: 14.5 % — CL (ref 36.0–46.0)
HCT: 14.9 % — ABNORMAL LOW (ref 36.0–46.0)
Hemoglobin: 3.9 g/dL — CL (ref 12.0–15.0)
Hemoglobin: 3.7 g/dL — CL (ref 12.0–15.0)
Immature Granulocytes: 0 %
Lymphocytes Relative: 14.1 % (ref 12.0–46.0)
Lymphocytes Relative: 10 %
Lymphs Abs: 1.3 10*3/uL (ref 0.7–4.0)
Lymphs Abs: 0.9 10*3/uL (ref 0.7–4.0)
MCH: 15.7 pg — ABNORMAL LOW (ref 26.0–34.0)
MCHC: 27.2 g/dL — ABNORMAL LOW (ref 30.0–36.0)
MCHC: 24.8 g/dL — ABNORMAL LOW (ref 30.0–36.0)
MCV: 59.3 fl — ABNORMAL LOW (ref 78.0–100.0)
MCV: 63.4 fL — ABNORMAL LOW (ref 80.0–100.0)
Monocytes Absolute: 0.6 10*3/uL (ref 0.1–1.0)
Monocytes Absolute: 0.8 10*3/uL (ref 0.1–1.0)
Monocytes Relative: 6.7 % (ref 3.0–12.0)
Monocytes Relative: 8 %
Neutro Abs: 7 10*3/uL (ref 1.4–7.7)
Neutro Abs: 7.9 10*3/uL — ABNORMAL HIGH (ref 1.7–7.7)
Neutrophils Relative %: 78 % — ABNORMAL HIGH (ref 43.0–77.0)
Neutrophils Relative %: 82 %
Platelets: 401 10*3/uL — ABNORMAL HIGH (ref 150.0–400.0)
Platelets: 392 10*3/uL (ref 150–400)
RBC: 2.45 Mil/uL — ABNORMAL LOW (ref 3.87–5.11)
RBC: 2.35 MIL/uL — ABNORMAL LOW (ref 3.87–5.11)
RDW: 22 % — ABNORMAL HIGH (ref 11.5–15.5)
RDW: 21.5 % — ABNORMAL HIGH (ref 11.5–15.5)
WBC: 9 10*3/uL (ref 4.0–10.5)
WBC: 9.7 10*3/uL (ref 4.0–10.5)
nRBC: 0.4 % — ABNORMAL HIGH (ref 0.0–0.2)

## 2019-12-22 LAB — LIPID PANEL
Cholesterol: 97 mg/dL (ref 0–200)
HDL: 22.9 mg/dL — ABNORMAL LOW (ref >39.00)
LDL Cholesterol: 59 mg/dL (ref 0–99)
NonHDL: 74.05
Total CHOL/HDL Ratio: 4
Triglycerides: 75 mg/dL (ref 0.0–149.0)
VLDL: 15 mg/dL (ref 0.0–40.0)

## 2019-12-22 LAB — IRON AND TIBC
Iron: 9 ug/dL — ABNORMAL LOW (ref 28–170)
Saturation Ratios: 2 % — ABNORMAL LOW (ref 10.4–31.8)
TIBC: 514 ug/dL — ABNORMAL HIGH (ref 250–450)
UIBC: 505 ug/dL

## 2019-12-22 LAB — BRAIN NATRIURETIC PEPTIDE: Pro B Natriuretic peptide (BNP): 215 pg/mL — ABNORMAL HIGH (ref 0.0–100.0)

## 2019-12-22 LAB — ABO/RH: ABO/RH(D): O POS

## 2019-12-22 LAB — PREPARE RBC (CROSSMATCH)

## 2019-12-22 LAB — RETICULOCYTES
Immature Retic Fract: 24.5 % — ABNORMAL HIGH (ref 2.3–15.9)
RBC.: 2.37 MIL/uL — ABNORMAL LOW (ref 3.87–5.11)
Retic Count, Absolute: 65.6 10*3/uL (ref 19.0–186.0)
Retic Ct Pct: 2.8 % (ref 0.4–3.1)

## 2019-12-22 LAB — VITAMIN B12: Vitamin B-12: 356 pg/mL (ref 180–914)

## 2019-12-22 LAB — HEMOGLOBIN A1C: Hgb A1c MFr Bld: 5.2 % (ref 4.6–6.5)

## 2019-12-22 LAB — POC OCCULT BLOOD, ED: Fecal Occult Bld: NEGATIVE

## 2019-12-22 LAB — SARS CORONAVIRUS 2 BY RT PCR (HOSPITAL ORDER, PERFORMED IN ~~LOC~~ HOSPITAL LAB): SARS Coronavirus 2: NEGATIVE

## 2019-12-22 LAB — FERRITIN: Ferritin: 3 ng/mL — ABNORMAL LOW (ref 11–307)

## 2019-12-22 LAB — FOLATE: Folate: 9.4 ng/mL (ref >5.9)

## 2019-12-22 MED ORDER — SODIUM CHLORIDE 0.9 % IV SOLN
10.0000 mL/h | Freq: Once | INTRAVENOUS | Status: AC
  Administered 2019-12-22: 10 mL/h via INTRAVENOUS

## 2019-12-22 MED ORDER — DOCUSATE SODIUM 100 MG PO CAPS
100.0000 mg | ORAL_CAPSULE | Freq: Two times a day (BID) | ORAL | Status: DC
  Administered 2019-12-23 – 2019-12-24 (×3): 100 mg via ORAL
  Filled 2019-12-22 (×3): qty 1

## 2019-12-22 MED ORDER — SODIUM CHLORIDE 0.9% FLUSH
3.0000 mL | Freq: Two times a day (BID) | INTRAVENOUS | Status: DC
  Administered 2019-12-23 – 2019-12-24 (×3): 3 mL via INTRAVENOUS

## 2019-12-22 MED ORDER — ONDANSETRON HCL 4 MG/2ML IJ SOLN: 4.0000 mg | Freq: Four times a day (QID) | INTRAMUSCULAR | Status: DC | PRN

## 2019-12-22 MED ORDER — SODIUM CHLORIDE 0.9 % IV SOLN: 250.0000 mL | INTRAVENOUS | Status: DC | PRN

## 2019-12-22 MED ORDER — FAMOTIDINE 20 MG PO TABS: 20.0000 mg | ORAL_TABLET | Freq: Every day | ORAL | Status: DC | PRN

## 2019-12-22 MED ORDER — SODIUM CHLORIDE 0.9% FLUSH
3.0000 mL | INTRAVENOUS | Status: DC | PRN
  Administered 2019-12-23: 3 mL via INTRAVENOUS

## 2019-12-22 MED ORDER — ONDANSETRON HCL 4 MG PO TABS: 4.0000 mg | ORAL_TABLET | Freq: Four times a day (QID) | ORAL | Status: DC | PRN

## 2019-12-22 MED ORDER — ACETAMINOPHEN 325 MG PO TABS
650.0000 mg | ORAL_TABLET | Freq: Four times a day (QID) | ORAL | Status: DC | PRN
  Administered 2019-12-23: 650 mg via ORAL
  Filled 2019-12-22: qty 2

## 2019-12-22 MED ORDER — ALBUTEROL SULFATE HFA 108 (90 BASE) MCG/ACT IN AERS
2.0000 | INHALATION_SPRAY | Freq: Four times a day (QID) | RESPIRATORY_TRACT | Status: DC | PRN
  Filled 2019-12-22: qty 6.7

## 2019-12-22 MED ORDER — ACETAMINOPHEN 650 MG RE SUPP: 650.0000 mg | Freq: Four times a day (QID) | RECTAL | Status: DC | PRN

## 2019-12-22 MED ORDER — HYDROCODONE-ACETAMINOPHEN 5-325 MG PO TABS: 1.0000 | ORAL_TABLET | ORAL | Status: DC | PRN

## 2019-12-22 MED ORDER — SODIUM CHLORIDE 0.9% IV SOLUTION: Freq: Once | INTRAVENOUS | Status: DC

## 2019-12-22 NOTE — Progress Notes (Signed)
Subjective:    Patient ID: Denise [name], female    DOB: 10-03-1960, 59 y.o.   MRN: [id number]  HPI  This visit occurred during the SARS-CoV-2 public health emergency.  Safety protocols were in place, including screening questions prior to the visit, additional usage of staff PPE, and extensive cleaning of exam room while observing appropriate contact time as indicated for disinfecting solutions.   Denise [name] is a 59 year old female with a history of hypertension who presents today with a chief complaint of numerous issues.   Prior to the Covid-19 pandemic she had plenty of energy/stamina. In early January 2021 she's noticed more fatigue, shortness of breath, taking more time to get through grocery stores and shopping. She admits that she's had little physical activity during 2020 because of the pandemic.   She's been experiencing sinus pressure, sinus headaches, nasal congestion, throat irritation, post nasal drip for the last three weeks. She was evaluated at [facility] on 12/12/19 and treated for acute sinusitis with Augmentin, Tessalon Perles, albuterol inhaler.   Since diagnosis and treatment for her sinusitis she's feeling worse starting six days ago. She has noticed symptoms of substernal chest pressure for which she describes as "an uncomfortable hurt". The pressure became worse when laying down in her bed, had to sit up on her sofa. She's having a tough time sleeping at night due to the chest pressure. She denies chest pressure when up right during the day until recently.   She's also noticed neck pain as she has to get very close to her computer screen in order to see her work. She is overdue to have her cataracts removed. About five days ago she noticed right lower extremity swelling to the ankle and foot with pain after sitting for prolonged periods of time. The following day she noticed improvement in her swelling but did have some swelling by the end of the day.   Four  evenings ago she started to notice increased chest pressure and inability to lay in bed at all, could not sleep for the last 2-3 nights.   Over the last 2-3 day she's noticed increased dyspnea with normal activity, even walking around her home. Also with fatigue and feeling tired. Dyspnea and chest pressure improved yesterday after napping on her couch, was actually able to sleep better last night as well. She took the last day off from work, also off today.   She has a history of tobacco abuse, quit smoking last year and had been smoking 1 to 1.5 PPD since the age of 30. Overall she endorses a healthy diet.   She does have occasional heartburn, also coughs daily in the morning and evening. She's using her albuterol twice daily with temporary improvement in chest tightness and shortness of breath.   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 Lots of stress. Her vacation time has been denied several times due to increased work demands. She's been approved for two days off this week. She would like a leave of absence from her employer starting May 19th through the next four weeks so she can focus on her overall health and work on stress reduction.     Review of Systems  Constitutional: Positive for fatigue.  Eyes: Positive for visual disturbance.  Respiratory: Positive for cough and shortness of breath.   Cardiovascular:       Chest pressure  Allergic/Immunologic: Positive for environmental  allergies.  Psychiatric/Behavioral: Negative for sleep disturbance. The patient is nervous/anxious.        Past Medical History:  Diagnosis Date  . Allergy   . BMI 35.0-35.9,adult 03/31/2012  . Chickenpox   . Cholecystitis with cholelithiasis 03/31/2012  . Heart murmur   . Hypertension 03/31/2012  . Shingles      Social History   Socioeconomic History  . Marital status: Widowed    Spouse name: Not on file  . Number of children: Not  on file  . Years of education: Not on file  . Highest education level: Not on file  Occupational History  . Not on file  Tobacco Use  . Smoking status: Former Smoker    Packs/day: 0.75    Years: 34.00    Pack years: 25.50    Types: Cigarettes    Quit date: 05/07/2019    Years since quitting: 0.6  . Smokeless tobacco: Never Used  Substance and Sexual Activity  . Alcohol use: No  . Drug use: No  . Sexual activity: Never    Birth control/protection: None, Post-menopausal  Other Topics Concern  . Not on file  Social History Narrative   Widow.    2 children.   Works in Clinical biochemist.   Enjoys reading, cooking.    Social Determinants of Health   Financial Resource Strain:   . Difficulty of Paying Living Expenses:   Food Insecurity:   . Worried About Programme researcher, broadcasting/film/video in the Last Year:   . Barista in the Last Year:   Transportation Needs:   . Freight forwarder (Medical):   Marland Kitchen Lack of Transportation (Non-Medical):   Physical Activity:   . Days of Exercise per Week:   . Minutes of Exercise per Session:   Stress:   . Feeling of Stress :   Social Connections:   . Frequency of Communication with Friends and Family:   . Frequency of Social Gatherings with Friends and Family:   . Attends Religious Services:   . Active Member of Clubs or Organizations:   . Attends Banker Meetings:   Marland Kitchen Marital Status:   Intimate Partner Violence:   . Fear of Current or Ex-Partner:   . Emotionally Abused:   Marland Kitchen Physically Abused:   . Sexually Abused:     Past Surgical History:  Procedure Laterality Date  . CHOLECYSTECTOMY  03/30/2012   Procedure: LAPAROSCOPIC CHOLECYSTECTOMY WITH INTRAOPERATIVE CHOLANGIOGRAM;  Surgeon: Mariella Saa, MD;  Location: [facility];  Service: General;  Laterality: N/A;  . TUBAL LIGATION      Family History  Problem Relation Age of Onset  . Arthritis Mother   . Cervical cancer Mother   . Heart disease Father   . Hypertension  Father     Allergies  Allergen Reactions  . Codeine Itching    Current Outpatient Medications on File Prior to Visit  Medication Sig Dispense Refill  . amLODipine (NORVASC) 10 MG tablet TAKE 1 TABLET BY MOUTH EVERY DAY FOR BLOOD PRESSURE 90 tablet 1  . lisinopril (ZESTRIL) 20 MG tablet TAKE 1 TABLET (20 MG TOTAL) BY MOUTH DAILY. FOR BLOOD PRESSURE. 90 tablet 0   No current facility-administered medications on file prior to visit.    BP 120/66   Pulse (!) 106   Temp (!) 96.2 F (35.7 C) (Temporal)   Ht 5' 4.5" (1.638 m)   Wt 183 lb 8 oz (83.2 kg)   SpO2 98%   BMI 31.01  kg/m    Objective:   Physical Exam  Constitutional: She is oriented to person, place, and time.  Cardiovascular: Normal rate.  Murmur heard. Respiratory: Effort normal. She has rales.  Musculoskeletal:     Cervical back: Neck supple.  Neurological: She is alert and oriented to person, place, and time.  Skin: Skin is warm and dry.  Psychiatric:  Appears very anxious           Assessment & Plan:

## 2019-12-22 NOTE — Telephone Encounter (Signed)
[facility] called critical results @ 1543  Hemoglobin 3.9  Hematocrit 14.5

## 2019-12-22 NOTE — Telephone Encounter (Signed)
I received a call report from [facility] on patient's chest xray:   IMPRESSION: 1. Interval 1.2 cm nodule in the superior segment of the left lower lobe. Recommend further evaluation with a chest CT with contrast. 2. Mild cardiomegaly and mild changes of COPD and chronic bronchitis.

## 2019-12-22 NOTE — Assessment & Plan Note (Signed)
Chronic and gradual for the last year. Exam today overall stable, no distress. Recently treated with Augmentin for sinusitis.  Differentials include CHF, COPD, deconditioning from sedentary lifestyle. Checking labs and chest xray today. Echocardiogram pending.  ECG today with NSR with rate of 90. Lots of artifact but appears to be benign. V3  And V1 appear very similar to ECG from 2013. No acute ST changes.

## 2019-12-22 NOTE — ED Notes (Signed)
Date and time results received: 12/22/19 cbc (use smartphrase ".now" to insert current time)  Test: cbc Critical Value: hgb 3.7  Name of Provider Notified: butler, md  Orders Received? Or Actions Taken?: .

## 2019-12-22 NOTE — Assessment & Plan Note (Signed)
Noted to right ankle with trace pitting.  Differentials include PVD, CHF, side effects from amlodipine. No evidence for DVT or cellulitis.  Obtain echocardiogram, labs. Consider changing amlodipine if swelling persists and other work up negative.

## 2019-12-22 NOTE — Telephone Encounter (Addendum)
Noted, will contact patient to discuss. She will need evaluation in the ED. Tam, where are the rest of her labs?

## 2019-12-22 NOTE — Patient Instructions (Addendum)
Stop by the lab and xray prior to leaving today. I will notify you of your results once received.   You will be contacted regarding your echocardiogram.  Please let us know if you have not been contacted within two weeks.   It was a pleasure to see you today!

## 2019-12-22 NOTE — H&P (Signed)
Denise [name] SEG:[id number] DOB: Aug 31, 1960 DOA: 12/22/2019    PCP: Doreene Nest, NP   Outpatient Specialists:      Patient arrived to ER on 12/22/19 at 1758  Patient coming from: home Lives  With family    Chief Complaint:   Chief Complaint  Patient presents with  . Anemia    HPI: Denise [name] is a 59 y.o. female with medical history significant of HTN, tobacco abuse,  anemia    Presented with dyspnea on the exertion since january  Had labs done today hg 3.7 no recently she have had chest pain with ambulation for the past 1 week. She went to urgent care and her PCP and was told to come to ER Last hg was 11.0   Seven years ago, no hx blood in stool, hemoccult negative No vaginal bleeding denies any GI blood loss. She has not seen a doctor for long time Never had a colonoscopy No hx of severe blood loss, no hx of heavy bleeding Patient reports for the past 2 years she has been eating ice but she never realized that this was related to her anemia.  Infectious risk factors:  Reports  shortness of breath,      Has  NOt been vaccinated against COVID (have not had a chance)  In  ER  COVID TEST  NEGATIVE   Lab Results  Component Value Date   SARSCOV2NAA NEGATIVE 12/22/2019   Regarding pertinent Chronic problems:      HTN on NOrvasc, lisinopril   obesity-   BMI Readings from Last 1 Encounters:  12/22/19 31.01 kg/m       Asthma -well  controlled on home inhalers     COPD - not  followed by pulmonology     While in ER: noted to have hg 3.9 Was ordred 3 units of blood   ER Provider Called:  GI   Dr. Meridee Score They Recommend admit to medicine   Will see in AM fine for upper and lower scope on Thursday, 20 May  Hospitalist was called for admission for iron deficiency anemia symptomatic  The following Work up has been ordered so far:  Orders Placed This Encounter  Procedures  . Critical Care  . SARS Coronavirus 2 by RT PCR (hospital order,  performed in [facility] hospital lab) Nasopharyngeal Nasopharyngeal Swab  . Comprehensive metabolic panel  . CBC  . Protime-INR  . Vitamin B12  . Folate  . Iron and TIBC  . Ferritin  . Reticulocytes  . Diet NPO time specified  . Orthostatic Vital signs  . Place X2 Large Bore IV's  . Initiate Carrier Fluid Protocol  . Informed Consent Details: Physician/Practitioner Attestation; Transcribe to consent form and obtain patient signature  . Consult to hospitalist  ALL PATIENTS BEING ADMITTED/HAVING PROCEDURES NEED COVID-19 SCREENING  . Pulse oximetry, continuous  . POC occult blood, ED  . ED EKG  . Type and screen [facility]  . Prepare RBC (crossmatch)  . ABO/Rh     Following Medications were ordered in ER: Medications  0.9 %  sodium chloride infusion (has no administration in time range)        Consult Orders  (From admission, onward)         Start     Ordered   12/22/19 1910  Consult to hospitalist  ALL PATIENTS BEING ADMITTED/HAVING PROCEDURES NEED COVID-19 SCREENING  Once    Comments: ALL PATIENTS BEING ADMITTED/HAVING PROCEDURES NEED COVID-19  SCREENING  Provider:  (Not yet assigned)  Question Answer Comment  Place call to: [facility]   Reason for Consult Admit      12/22/19 1909          Significant initial  Findings: Abnormal Labs Reviewed  COMPREHENSIVE METABOLIC PANEL - Abnormal; Notable for the following components:      Result Value   Sodium 128 (*)    Chloride 95 (*)    Glucose, Bld 109 (*)    Calcium 8.6 (*)    Total Protein 6.2 (*)    Albumin 3.2 (*)    Total Bilirubin 0.2 (*)    All other components within normal limits  CBC - Abnormal; Notable for the following components:   RBC 2.37 (*)    Hemoglobin 3.7 (*)    HCT 15.2 (*)    MCV 64.1 (*)    MCH 15.6 (*)    MCHC 24.3 (*)    RDW 21.3 (*)    nRBC 0.5 (*)    All other components within normal limits    Otherwise labs showing:    Recent Labs  Lab  12/22/19 1810  NA 128*  K 4.5  CO2 22  GLUCOSE 109*  BUN 9  CREATININE 0.67  CALCIUM 8.6*    Cr   stable,   Lab Results  Component Value Date   CREATININE 0.67 12/22/2019   CREATININE 0.75 01/21/2019   CREATININE 0.61 11/05/2017    Recent Labs  Lab 12/22/19 1810  AST 19  ALT 12  ALKPHOS 68  BILITOT 0.2*  PROT 6.2*  ALBUMIN 3.2*   Lab Results  Component Value Date   CALCIUM 8.6 (L) 12/22/2019     WBC      Component Value Date/Time   WBC 8.6 12/22/2019 1810   ANC    Component Value Date/Time   NEUTROABS 8.4 (H) 03/29/2012 2047      Plt: Lab Results  Component Value Date   PLT 396 12/22/2019       HG/HCT Down  from baseline see below    Component Value Date/Time   HGB 3.7 (LL) 12/22/2019 1810   HCT 15.2 (L) 12/22/2019 1810    No results for input(s): LIPASE, AMYLASE in the last 168 hours. No results for input(s): AMMONIA in the last 168 hours.  No components found for: LABALBU   Troponin  ordered      ECG: Ordered Personally reviewed by me showing: HR : 91 Rhythm:  NSR,   nonspecific changes,   QTC 415     UA  Ordered    Ordered    CXR -cardiomegaly and pulmonary nodule    ED Triage Vitals  Enc Vitals Group     BP 12/22/19 1830 (!) 125/58     Pulse Rate 12/22/19 1830 97     Resp 12/22/19 1830 20     Temp 12/22/19 1830 98.6 F (37 C)     Temp Source 12/22/19 1830 Oral     SpO2 12/22/19 1830 92 %     Weight --      Height --      Head Circumference --      Peak Flow --      Pain Score 12/22/19 1806 0     Pain Loc --      Pain Edu? --      Excl. in GC? --   TMAX(24)@       Latest  Blood pressure (!) 104/46, pulse Marland Kitchen(!)  101, temperature 98.6 F (37 C), temperature source Oral, resp. rate 16, SpO2 99 %.    Review of Systems:    Pertinent positives include:  fatigue,  shortness of breath at rest. No dyspnea on exertion  chest pain,   Constitutional:  No weight loss, night sweats, Fevers, chills,weight loss  HEENT:  No  headaches, Difficulty swallowing,Tooth/dental problems,Sore throat,  No sneezing, itching, ear ache, nasal congestion, post nasal drip,  Cardio-vascular:  NoOrthopnea, PND, anasarca, dizziness, palpitations.no Bilateral lower extremity swelling  GI:  No heartburn, indigestion, abdominal pain, nausea, vomiting, diarrhea, change in bowel habits, loss of appetite, melena, blood in stool, hematemesis Resp:  n, No excess mucus, no productive cough, No non-productive cough, No coughing up of blood.No change in color of mucus.No wheezing. Skin:  no rash or lesions. No jaundice GU:  no dysuria, change in color of urine, no urgency or frequency. No straining to urinate.  No flank pain.  Musculoskeletal:  No joint pain or no joint swelling. No decreased range of motion. No back pain.  Psych:  No change in mood or affect. No depression or anxiety. No memory loss.  Neuro: no localizing neurological complaints, no tingling, no weakness, no double vision, no gait abnormality, no slurred speech, no confusion  All systems reviewed and apart from HOPI all are negative  Past Medical History:   Past Medical History:  Diagnosis Date  . Allergy   . BMI 35.0-35.9,adult 03/31/2012  . Chickenpox   . Cholecystitis with cholelithiasis 03/31/2012  . Heart murmur   . Hypertension 03/31/2012  . Shingles       Past Surgical History:  Procedure Laterality Date  . CHOLECYSTECTOMY  03/30/2012   Procedure: LAPAROSCOPIC CHOLECYSTECTOMY WITH INTRAOPERATIVE CHOLANGIOGRAM;  Surgeon: Mariella Saa, MD;  Location: [facility]S;  Service: General;  Laterality: N/A;  . TUBAL LIGATION      Social History:  Ambulatory  Independently      reports that she quit smoking about 7 months ago. Her smoking use included cigarettes. She has a 25.50 pack-year smoking history. She has never used smokeless tobacco. She reports that she does not drink alcohol or use drugs.     Family History:   Family History  Problem  Relation Age of Onset  . Arthritis Mother   . Cervical cancer Mother   . Heart disease Father   . Hypertension Father     Allergies: Allergies  Allergen Reactions  . Codeine Itching     Prior to Admission medications   Medication Sig Start Date End Date Taking? Authorizing Provider  albuterol (VENTOLIN HFA) 108 (90 Base) MCG/ACT inhaler Inhale 2 puffs into the lungs every 6 (six) hours as needed for wheezing or shortness of breath.   Yes [provider]  amLODipine (NORVASC) 10 MG tablet TAKE 1 TABLET BY MOUTH EVERY DAY FOR BLOOD PRESSURE Patient taking differently: Take 10 mg by mouth daily.  10/26/19  Yes Doreene Nest, NP  Ascorbic Acid (VITAMIN C) 1000 MG tablet Take 1,000 mg by mouth daily.   Yes [provider]  aspirin EC 81 MG tablet Take 81 mg by mouth daily.   Yes [provider]  benzonatate (TESSALON PERLES) 100 MG capsule Take 100 mg by mouth 3 (three) times daily as needed for cough.   Yes [provider]  diphenhydrAMINE HCl (BENADRYL ALLERGY PO) Take 2 capsules by mouth daily as needed (allergy).   Yes [provider]  famotidine (PEPCID) 20 MG tablet Take 20  mg by mouth daily as needed for heartburn or indigestion.   Yes [provider]  lisinopril (ZESTRIL) 20 MG tablet TAKE 1 TABLET (20 MG TOTAL) BY MOUTH DAILY. FOR BLOOD PRESSURE. Patient taking differently: Take 20 mg by mouth daily.  11/02/19  Yes Doreene Nest, NP  vitamin E (VITAMIN E) 180 MG (400 UNITS) capsule Take 800 Units by mouth daily.   Yes [provider]   Physical Exam: Blood pressure (!) 104/46, pulse (!) 101, temperature 98.6 F (37 C), temperature source Oral, resp. rate 16, SpO2 99 %. 1. General:  in No  Acute distress    Chronically ill pale -appearing 2. Psychological: Alert and  Oriented 3. Head/ENT:   Moist   Mucous Membranes                          Head Non traumatic, neck supple                            Poor  Dentition 4. SKIN:  decreased Skin turgor,  Skin clean Dry and intact no rash 5. Heart: Regular rate and rhythm no  Murmur, no Rub or gallop 6. Lungs: no wheezes or crackles   7. Abdomen: Soft,  non-tender, Non distended   Obese bowel sounds present 8. Lower extremities: no clubbing, cyanosis, trace edema 9. Neurologically Grossly intact, moving all 4 extremities equally  10. MSK: Normal range of motion   All other LABS:     Recent Labs  Lab 12/22/19 1810  WBC 8.6  HGB 3.7*  HCT 15.2*  MCV 64.1*  PLT 396     Recent Labs  Lab 12/22/19 1810  NA 128*  K 4.5  CL 95*  CO2 22  GLUCOSE 109*  BUN 9  CREATININE 0.67  CALCIUM 8.6*     Recent Labs  Lab 12/22/19 1810  AST 19  ALT 12  ALKPHOS 68  BILITOT 0.2*  PROT 6.2*  ALBUMIN 3.2*       Cultures: No results found for: SDES, SPECREQUEST, CULT, REPTSTATUS   Radiological Exams on Admission: DG Chest 2 View  Result Date: 12/22/2019 CLINICAL DATA:  Cough and dyspnea. Smoker. EXAM: CHEST - 2 VIEW COMPARISON:  03/30/2012 FINDINGS: Mildly enlarged cardiac silhouette with a mild increase in size. Mild hyperexpansion of the lungs with mild peribronchial thickening. Interval 1.2 cm nodule in the superior segment of the left lower lobe. Unremarkable bones. Cholecystectomy clips. IMPRESSION: 1. Interval 1.2 cm nodule in the superior segment of the left lower lobe. Recommend further evaluation with a chest CT with contrast. 2. Mild cardiomegaly and mild changes of COPD and chronic bronchitis. These results will be called to the ordering clinician or representative by the Radiologist Assistant, and communication documented in the PACS or [employer]. Electronically Signed   By: Beckie Salts M.D.   On: 12/22/2019 14:35    Chart has been reviewed  Assessment/Plan  59 y.o. female with medical history significant of HTN, tobacco abuse,  anemia  Admitted for symptomatic anemia  Present on Admission: . Symptomatic anemia  -transfuse 3 units etiology unclear given microcytic will need further GI work-up Discussed with GI plan for upper and lower scope on 20 May Will transfuse 3 units Obtain serial CBC Anemia panel showed iron deficiency anemia  . Hypertension -hold blood pressure medications given hypotension   . Chest pressure -obtain EKG cycle cardiac enzymes and echogram  .  Cardiomegaly obtain echogram  . Tobacco abuse spoke about importance of quitting  . Hyponatremia obtain urine electrolytes check TSH Patient with abnormal chest x-ray will obtain CT to further evaluate  . COPD (chronic obstructive pulmonary disease) ([place]) importance of quitting tobacco.  Continue albuterol as needed may need to add Atrovent  Pulmonary nodule -obtain CT with contrast in a.m. to further evaluate   Other plan as per orders.  DVT prophylaxis:  SCD       Code Status:  FULL CODE as per patient    I had personally discussed CODE STATUS with patient and family   Family Communication:   Family at  Bedside  plan of care was discussed on the phone with   Daughter   Disposition Plan:     To home once workup is complete and patient is stable   Following barriers for discharge:                            Electrolytes corrected                              Anemia improved                                                        Will need consultants to evaluate patient prior to discharge                                        Consults called: [facility]  GI  Admission status:  ED Disposition    ED Disposition Condition [place]: [place] [100100]  Level of Care: Progressive [102]  Admit to Progressive based on following criteria: Other see comments  Comments: severe anemia  Covid Evaluation: Confirmed COVID Negative  Diagnosis: Symptomatic anemia [[id number]]  Admitting Physician: Toy Baker [[id number]]  Attending Physician: Toy Baker [[id number]]        Obs     Level of care         SDU tele indefinitely please discontinue once patient no longer qualifies   Precautions: admitted as Covid Negative     PPE: Used by the provider:   P100  eye Goggles,  Gloves    Amro Winebarger 12/22/2019, 9:30 PM    [facility]     after 2 AM please page floor coverage PA If 7AM-7PM, please contact the day team taking care of the patient using [email or web address]   Patient was evaluated in the context of the global COVID-19 pandemic, which necessitated consideration that the patient might be at risk for infection with the SARS-CoV-2 virus that causes COVID-19. Institutional protocols and algorithms that pertain to the evaluation of patients at risk for COVID-19 are in a state of rapid change based on information released by regulatory bodies including the CDC and federal and state organizations. These policies and algorithms were followed during the patient's care.

## 2019-12-22 NOTE — ED Triage Notes (Signed)
Pt sent from PCP d/t anemai, hgb 3.9.

## 2019-12-22 NOTE — Assessment & Plan Note (Signed)
Well controlled in the office today, continue amlodipine and lisinopril. CMP pending.  Consider changing lisinopril given daily cough. Cough may be secondary to uncontrolled COPD. Await results.

## 2019-12-22 NOTE — Assessment & Plan Note (Signed)
Acute for the last week, also with cough, allergy symptoms, dyspnea, anxiety.  ECG today with NSR with rate of 90. Lots of artifact but appears to be benign. V3  And V1 appear very similar to ECG from 2013. No acute ST changes.  Chest xray pending. Checking labs today.

## 2019-12-22 NOTE — Assessment & Plan Note (Signed)
Noted to aortic side, moderate. Given exertional dyspnea coupled with fatigue and right lower extremity edema, we will obtain an echocardiogram for further evaluation.   BNP, CMP, CBC, echocardiogram pending.

## 2019-12-22 NOTE — Telephone Encounter (Addendum)
Spoke with patient via phone regarding hemoglobin of 3.9. Recommended ED evaluation now, she will go to [facility]. I spoke with Seward Grater, RN from [facility] and provided report.

## 2019-12-22 NOTE — ED Provider Notes (Signed)
[facility] EMERGENCY DEPARTMENT Provider Note   CSN: [id number] Arrival date & time: 12/22/19  1758     History Chief Complaint  Patient presents with  . Anemia    Denise [name] is a 59 y.o. female.  She has a history of hypertension.  She is complaining of 4 months of progressively increasing dyspnea on exertion.  She blamed it on her sinuses and what she thinks was walking pneumonia which was treated with antibiotics.  The only time she noticed any dark stool was when she took the antibiotics.  No other obvious bleeding.  Saw her PCP today and there found her to have a hemoglobin of 3.9.  No chest pain no dizziness no syncope  The history is provided by the patient.  Anemia This is a new problem. Episode onset: ? months. The problem occurs constantly. The problem has not changed since onset.Associated symptoms include shortness of breath. Pertinent negatives include no chest pain, no abdominal pain and no headaches. The symptoms are aggravated by walking and exertion. The symptoms are relieved by rest. She has tried nothing for the symptoms. The treatment provided no relief.       Past Medical History:  Diagnosis Date  . Allergy   . BMI 35.0-35.9,adult 03/31/2012  . Chickenpox   . Cholecystitis with cholelithiasis 03/31/2012  . Heart murmur   . Hypertension 03/31/2012  . Shingles     Patient Active Problem List   Diagnosis Date Noted  . Chest pressure 12/22/2019  . Murmur 12/22/2019  . Lower extremity edema 12/22/2019  . Exertional dyspnea 12/22/2019  . Former smoker 08/31/2019  . Hypertension 03/31/2012    Past Surgical History:  Procedure Laterality Date  . CHOLECYSTECTOMY  03/30/2012   Procedure: LAPAROSCOPIC CHOLECYSTECTOMY WITH INTRAOPERATIVE CHOLANGIOGRAM;  Surgeon: Edward Jolly, MD;  Location: [facility];  Service: General;  Laterality: N/A;  . TUBAL LIGATION       OB History   No obstetric history on file.     Family History    Problem Relation Age of Onset  . Arthritis Mother   . Cervical cancer Mother   . Heart disease Father   . Hypertension Father     Social History   Tobacco Use  . Smoking status: Former Smoker    Packs/day: 0.75    Years: 34.00    Pack years: 25.50    Types: Cigarettes    Quit date: 05/07/2019    Years since quitting: 0.6  . Smokeless tobacco: Never Used  Substance Use Topics  . Alcohol use: No  . Drug use: No    Home Medications Prior to Admission medications   Medication Sig Start Date End Date Taking? Authorizing Provider  amLODipine (NORVASC) 10 MG tablet TAKE 1 TABLET BY MOUTH EVERY DAY FOR BLOOD PRESSURE 10/26/19   Pleas Koch, NP  lisinopril (ZESTRIL) 20 MG tablet TAKE 1 TABLET (20 MG TOTAL) BY MOUTH DAILY. FOR BLOOD PRESSURE. 11/02/19   Pleas Koch, NP    Allergies    Codeine  Review of Systems   Review of Systems  Constitutional: Negative for fever.  HENT: Negative for sore throat.   Eyes: Negative for visual disturbance.  Respiratory: Positive for shortness of breath.   Cardiovascular: Negative for chest pain.  Gastrointestinal: Negative for abdominal pain.  Genitourinary: Negative for dysuria.  Musculoskeletal: Negative for neck pain.  Skin: Negative for rash.  Neurological: Negative for headaches.    Physical Exam Updated Vital Signs BP  136/69   Pulse 97   Temp 98 F (36.7 C) (Oral)   Resp 17   Ht 5\' 4"  (1.626 m)   Wt 81.2 kg   SpO2 96%   BMI 30.73 kg/m   Physical Exam Vitals and nursing note reviewed.  Constitutional:      General: She is not in acute distress.    Appearance: She is well-developed.  HENT:     Head: Normocephalic and atraumatic.  Eyes:     Extraocular Movements: Extraocular movements intact.  Cardiovascular:     Rate and Rhythm: Normal rate and regular rhythm.     Heart sounds: No murmur.  Pulmonary:     Effort: Pulmonary effort is normal. No respiratory distress.     Breath sounds: Normal breath  sounds.  Abdominal:     Palpations: Abdomen is soft.     Tenderness: There is no abdominal tenderness.  Musculoskeletal:        General: No deformity or signs of injury. Normal range of motion.     Cervical back: Neck supple.  Skin:    General: Skin is warm and dry.     Capillary Refill: Capillary refill takes less than 2 seconds.     Coloration: Skin is pale.  Neurological:     General: No focal deficit present.     Mental Status: She is alert.     Sensory: No sensory deficit.     Motor: No weakness.     Gait: Gait normal.     ED Results / Procedures / Treatments   Labs (all labs ordered are listed, but only abnormal results are displayed) Labs Reviewed  COMPREHENSIVE METABOLIC PANEL - Abnormal; Notable for the following components:      Result Value   Sodium 128 (*)    Chloride 95 (*)    Glucose, Bld 109 (*)    Calcium 8.6 (*)    Total Protein 6.2 (*)    Albumin 3.2 (*)    Total Bilirubin 0.2 (*)    All other components within normal limits  CBC - Abnormal; Notable for the following components:   RBC 2.37 (*)    Hemoglobin 3.7 (*)    HCT 15.2 (*)    MCV 64.1 (*)    MCH 15.6 (*)    MCHC 24.3 (*)    RDW 21.3 (*)    nRBC 0.5 (*)    All other components within normal limits  IRON AND TIBC - Abnormal; Notable for the following components:   Iron 9 (*)    TIBC 514 (*)    Saturation Ratios 2 (*)    All other components within normal limits  FERRITIN - Abnormal; Notable for the following components:   Ferritin 3 (*)    All other components within normal limits  RETICULOCYTES - Abnormal; Notable for the following components:   RBC. 2.37 (*)    Immature Retic Fract 24.5 (*)    All other components within normal limits  CBC WITH DIFFERENTIAL/PLATELET - Abnormal; Notable for the following components:   RBC 2.35 (*)    Hemoglobin 3.7 (*)    HCT 14.9 (*)    MCV 63.4 (*)    MCH 15.7 (*)    MCHC 24.8 (*)    RDW 21.5 (*)    nRBC 0.4 (*)    Neutro Abs 7.9 (*)    All  other components within normal limits  OSMOLALITY, URINE - Abnormal; Notable for the following components:   Osmolality, Ur  120 (*)    All other components within normal limits  URINALYSIS, ROUTINE W REFLEX MICROSCOPIC - Abnormal; Notable for the following components:   Color, Urine STRAW (*)    Specific Gravity, Urine 1.003 (*)    All other components within normal limits  SARS CORONAVIRUS 2 BY RT PCR (HOSPITAL ORDER, PERFORMED IN [facility])  PROTIME-INR  VITAMIN B12  FOLATE  SODIUM, URINE, RANDOM  CREATININE, URINE, RANDOM  BRAIN NATRIURETIC PEPTIDE  CBC WITH DIFFERENTIAL/PLATELET  HIV ANTIBODY (ROUTINE TESTING W REFLEX)  MAGNESIUM  PHOSPHORUS  TSH  OSMOLALITY  BASIC METABOLIC PANEL  CBC  POC OCCULT BLOOD, ED  TYPE AND SCREEN  PREPARE RBC (CROSSMATCH)  ABO/RH    EKG EKG Interpretation  Date/Time:  Tuesday Dec 22 2019 17:57:28 EDT Ventricular Rate:  91 PR Interval:  144 QRS Duration: 82 QT Interval:  338 QTC Calculation: 415 R Axis:   42 Text Interpretation: Sinus rhythm with Premature supraventricular complexes Low voltage QRS Nonspecific ST abnormality Abnormal ECG Confirmed by Drema Pry [phone]) on 12/23/2019 10:34:22 AM   Radiology DG Chest 2 View  Result Date: 12/22/2019 CLINICAL DATA:  Cough and dyspnea. Smoker. EXAM: CHEST - 2 VIEW COMPARISON:  03/30/2012 FINDINGS: Mildly enlarged cardiac silhouette with a mild increase in size. Mild hyperexpansion of the lungs with mild peribronchial thickening. Interval 1.2 cm nodule in the superior segment of the left lower lobe. Unremarkable bones. Cholecystectomy clips. IMPRESSION: 1. Interval 1.2 cm nodule in the superior segment of the left lower lobe. Recommend further evaluation with a chest CT with contrast. 2. Mild cardiomegaly and mild changes of COPD and chronic bronchitis. These results will be called to the ordering clinician or representative by the Radiologist Assistant, and communication  documented in the PACS or [employer]. Electronically Signed   By: Beckie Salts M.D.   On: 12/22/2019 14:35    Procedures .Critical Care Performed by: Terrilee Files, MD Authorized by: Terrilee Files, MD   Critical care provider statement:    Critical care time (minutes):  45   Critical care time was exclusive of:  Separately billable procedures and treating other patients   Critical care was necessary to treat or prevent imminent or life-threatening deterioration of the following conditions:  Circulatory failure   Critical care was time spent personally by me on the following activities:  Discussions with consultants, evaluation of patient's response to treatment, examination of patient, ordering and performing treatments and interventions, ordering and review of laboratory studies, ordering and review of radiographic studies, pulse oximetry, re-evaluation of patient's condition, obtaining history from patient or surrogate, review of old charts and development of treatment plan with patient or surrogate   I assumed direction of critical care for this patient from another provider in my specialty: no     (including critical care time)  Medications Ordered in ED Medications  famotidine (PEPCID) tablet 20 mg (has no administration in time range)  sodium chloride flush (NS) 0.9 % injection 3 mL (0 mLs Intravenous Duplicate 12/23/19 0851)  acetaminophen (TYLENOL) tablet 650 mg (has no administration in time range)    Or  acetaminophen (TYLENOL) suppository 650 mg (has no administration in time range)  HYDROcodone-acetaminophen (NORCO/VICODIN) 5-325 MG per tablet 1-2 tablet (has no administration in time range)  docusate sodium (COLACE) capsule 100 mg (has no administration in time range)  ondansetron (ZOFRAN) tablet 4 mg (has no administration in time range)    Or  ondansetron (ZOFRAN) injection 4 mg (has  no administration in time range)  0.9 %  sodium chloride infusion (Manually  program via Guardrails IV Fluids) ( Intravenous Not Given 12/23/19 0030)  sodium chloride flush (NS) 0.9 % injection 3 mL (3 mLs Intravenous Given 12/23/19 0851)  sodium chloride flush (NS) 0.9 % injection 3 mL (has no administration in time range)  0.9 %  sodium chloride infusion (has no administration in time range)  ipratropium-albuterol (DUONEB) 0.5-2.5 (3) MG/3ML nebulizer solution 3 mL (3 mLs Nebulization Not Given 12/23/19 0858)  albuterol (PROVENTIL) (2.5 MG/3ML) 0.083% nebulizer solution 2.5 mg (2.5 mg Nebulization Given 12/23/19 0215)  guaiFENesin-dextromethorphan (ROBITUSSIN DM) 100-10 MG/5ML syrup 5 mL (has no administration in time range)  phenol (CHLORASEPTIC) mouth spray 1 spray (has no administration in time range)  LORazepam (ATIVAN) tablet 0.5 mg (0.5 mg Oral Given 12/23/19 0849)  ferumoxytol (FERAHEME) 510 mg in sodium chloride 0.9 % 100 mL IVPB (has no administration in time range)  0.9 %  sodium chloride infusion (10 mL/hr Intravenous New Bag/Given 12/22/19 1953)  iohexol (OMNIPAQUE) 300 MG/ML solution 100 mL (100 mLs Intravenous Contrast Given 12/23/19 7741)    ED Course  I have reviewed the triage vital signs and the nursing notes.  Pertinent labs & imaging results that were available during my care of the patient were reviewed by me and considered in my medical decision making (see chart for details).  Clinical Course as of Dec 23 1047  Tue Dec 22, 2019  1839 Rectal exam done with nurse chaperone.  Normal sphincter tone no stool in vault.  Sample sent for guaiac.   [MB]  2310 Discussed with Dr. Meridee Score, GI.  He said they would see her tomorrow in anticipation for upper and lower endoscopy on Thursday.   [MB]    Clinical Course User Index [MB] Terrilee Files, MD   MDM Rules/Calculators/A&P                     This patient complains of dyspnea on exertion and anemia; this involves an extensive number of treatment Options and is a complaint that carries with it a  high risk of complications and Morbidity. The differential includes anemia, metabolic derangement, GI bleed, ACS, pneumonia  I ordered, reviewed and interpreted labs, which included critically low hemoglobin, chemistries with a mildly low sodium of 128.  Guaiac negative I ordered medication transfusion of packed red blood cells Additional history obtained from patient's daughter Previous records obtained and reviewed in epic I consulted Dr. Meridee Score GI and [facility] hospitalist Dr. Adela Glimpse and discussed lab and imaging findings  Critical Interventions: Transfusion of red blood cells  After the interventions stated above, I reevaluated the patient and found patient to be hemodynamically stable.  She understands she needs to be admitted for further work-up of her anemia.    Final Clinical Impression(s) / ED Diagnoses Final diagnoses:  Symptomatic anemia    Rx / DC Orders ED Discharge Orders    None       Terrilee Files, MD 12/23/19 1052

## 2019-12-23 ENCOUNTER — Observation Stay (HOSPITAL_COMMUNITY): Payer: 59

## 2019-12-23 DIAGNOSIS — J189 Pneumonia, unspecified organism: Secondary | ICD-10-CM | POA: Diagnosis present

## 2019-12-23 DIAGNOSIS — I2510 Atherosclerotic heart disease of native coronary artery without angina pectoris: Secondary | ICD-10-CM | POA: Diagnosis present

## 2019-12-23 DIAGNOSIS — Z6830 Body mass index (BMI) 30.0-30.9, adult: Secondary | ICD-10-CM | POA: Diagnosis not present

## 2019-12-23 DIAGNOSIS — K5521 Angiodysplasia of colon with hemorrhage: Secondary | ICD-10-CM | POA: Diagnosis present

## 2019-12-23 DIAGNOSIS — F17210 Nicotine dependence, cigarettes, uncomplicated: Secondary | ICD-10-CM | POA: Diagnosis present

## 2019-12-23 DIAGNOSIS — D509 Iron deficiency anemia, unspecified: Secondary | ICD-10-CM | POA: Diagnosis not present

## 2019-12-23 DIAGNOSIS — E871 Hypo-osmolality and hyponatremia: Secondary | ICD-10-CM | POA: Diagnosis present

## 2019-12-23 DIAGNOSIS — Z79899 Other long term (current) drug therapy: Secondary | ICD-10-CM | POA: Diagnosis not present

## 2019-12-23 DIAGNOSIS — D692 Other nonthrombocytopenic purpura: Secondary | ICD-10-CM | POA: Diagnosis present

## 2019-12-23 DIAGNOSIS — K644 Residual hemorrhoidal skin tags: Secondary | ICD-10-CM | POA: Diagnosis present

## 2019-12-23 DIAGNOSIS — Z720 Tobacco use: Secondary | ICD-10-CM | POA: Diagnosis not present

## 2019-12-23 DIAGNOSIS — K635 Polyp of colon: Secondary | ICD-10-CM | POA: Diagnosis present

## 2019-12-23 DIAGNOSIS — J439 Emphysema, unspecified: Secondary | ICD-10-CM | POA: Diagnosis present

## 2019-12-23 DIAGNOSIS — D649 Anemia, unspecified: Secondary | ICD-10-CM | POA: Diagnosis present

## 2019-12-23 DIAGNOSIS — R911 Solitary pulmonary nodule: Secondary | ICD-10-CM

## 2019-12-23 DIAGNOSIS — I119 Hypertensive heart disease without heart failure: Secondary | ICD-10-CM | POA: Diagnosis present

## 2019-12-23 DIAGNOSIS — Z885 Allergy status to narcotic agent status: Secondary | ICD-10-CM | POA: Diagnosis not present

## 2019-12-23 DIAGNOSIS — Z8261 Family history of arthritis: Secondary | ICD-10-CM | POA: Diagnosis not present

## 2019-12-23 DIAGNOSIS — E669 Obesity, unspecified: Secondary | ICD-10-CM | POA: Diagnosis present

## 2019-12-23 DIAGNOSIS — Z8049 Family history of malignant neoplasm of other genital organs: Secondary | ICD-10-CM | POA: Diagnosis not present

## 2019-12-23 DIAGNOSIS — I700 Atherosclerosis of aorta: Secondary | ICD-10-CM | POA: Diagnosis present

## 2019-12-23 DIAGNOSIS — D62 Acute posthemorrhagic anemia: Secondary | ICD-10-CM | POA: Diagnosis present

## 2019-12-23 DIAGNOSIS — Z7982 Long term (current) use of aspirin: Secondary | ICD-10-CM | POA: Diagnosis not present

## 2019-12-23 DIAGNOSIS — Z20822 Contact with and (suspected) exposure to covid-19: Secondary | ICD-10-CM | POA: Diagnosis present

## 2019-12-23 DIAGNOSIS — I959 Hypotension, unspecified: Secondary | ICD-10-CM | POA: Diagnosis present

## 2019-12-23 DIAGNOSIS — I10 Essential (primary) hypertension: Secondary | ICD-10-CM | POA: Diagnosis not present

## 2019-12-23 DIAGNOSIS — Z8249 Family history of ischemic heart disease and other diseases of the circulatory system: Secondary | ICD-10-CM | POA: Diagnosis not present

## 2019-12-23 DIAGNOSIS — Q438 Other specified congenital malformations of intestine: Secondary | ICD-10-CM | POA: Diagnosis not present

## 2019-12-23 DIAGNOSIS — J45909 Unspecified asthma, uncomplicated: Secondary | ICD-10-CM | POA: Diagnosis present

## 2019-12-23 LAB — URINALYSIS, ROUTINE W REFLEX MICROSCOPIC
Bilirubin Urine: NEGATIVE
Glucose, UA: NEGATIVE mg/dL
Hgb urine dipstick: NEGATIVE
Ketones, ur: NEGATIVE mg/dL
Leukocytes,Ua: NEGATIVE
Nitrite: NEGATIVE
Protein, ur: NEGATIVE mg/dL
Specific Gravity, Urine: 1.003 — ABNORMAL LOW (ref 1.005–1.030)
pH: 7 (ref 5.0–8.0)

## 2019-12-23 LAB — CBC
HCT: 28.8 % — ABNORMAL LOW (ref 36.0–46.0)
Hemoglobin: 8.4 g/dL — ABNORMAL LOW (ref 12.0–15.0)
MCH: 21.4 pg — ABNORMAL LOW (ref 26.0–34.0)
MCHC: 29.2 g/dL — ABNORMAL LOW (ref 30.0–36.0)
MCV: 73.5 fL — ABNORMAL LOW (ref 80.0–100.0)
Platelets: 321 10*3/uL (ref 150–400)
RBC: 3.92 MIL/uL (ref 3.87–5.11)
RDW: 27.7 % — ABNORMAL HIGH (ref 11.5–15.5)
WBC: 9.2 10*3/uL (ref 4.0–10.5)
nRBC: 0.4 % — ABNORMAL HIGH (ref 0.0–0.2)

## 2019-12-23 LAB — BASIC METABOLIC PANEL
Anion gap: 10 (ref 5–15)
BUN: <5 mg/dL — ABNORMAL LOW (ref 6–20)
CO2: 22 mmol/L (ref 22–32)
Calcium: 8.7 mg/dL — ABNORMAL LOW (ref 8.9–10.3)
Chloride: 101 mmol/L (ref 98–111)
Creatinine, Ser: 0.59 mg/dL (ref 0.44–1.00)
GFR calc Af Amer: >60 mL/min (ref >60)
GFR calc non Af Amer: >60 mL/min (ref >60)
Glucose, Bld: 96 mg/dL (ref 70–99)
Potassium: 3.7 mmol/L (ref 3.5–5.1)
Sodium: 133 mmol/L — ABNORMAL LOW (ref 135–145)

## 2019-12-23 LAB — CBC WITH DIFFERENTIAL/PLATELET
Abs Immature Granulocytes: 0.04 10*3/uL (ref 0.00–0.07)
Basophils Absolute: 0.1 10*3/uL (ref 0.0–0.1)
Basophils Relative: 1 %
Eosinophils Absolute: 0 10*3/uL (ref 0.0–0.5)
Eosinophils Relative: 0 %
HCT: 27.4 % — ABNORMAL LOW (ref 36.0–46.0)
Hemoglobin: 8.2 g/dL — ABNORMAL LOW (ref 12.0–15.0)
Immature Granulocytes: 1 %
Lymphocytes Relative: 9 %
Lymphs Abs: 0.8 10*3/uL (ref 0.7–4.0)
MCH: 22.2 pg — ABNORMAL LOW (ref 26.0–34.0)
MCHC: 29.9 g/dL — ABNORMAL LOW (ref 30.0–36.0)
MCV: 74.1 fL — ABNORMAL LOW (ref 80.0–100.0)
Monocytes Absolute: 0.7 10*3/uL (ref 0.1–1.0)
Monocytes Relative: 8 %
Neutro Abs: 6.5 10*3/uL (ref 1.7–7.7)
Neutrophils Relative %: 81 %
Platelets: 324 10*3/uL (ref 150–400)
RBC: 3.7 MIL/uL — ABNORMAL LOW (ref 3.87–5.11)
RDW: 27.6 % — ABNORMAL HIGH (ref 11.5–15.5)
WBC: 8.1 10*3/uL (ref 4.0–10.5)
nRBC: 0.6 % — ABNORMAL HIGH (ref 0.0–0.2)

## 2019-12-23 LAB — CREATININE, URINE, RANDOM: Creatinine, Urine: 22.35 mg/dL

## 2019-12-23 LAB — HEPATITIS C ANTIBODY
Hepatitis C Ab: NONREACTIVE
SIGNAL TO CUT-OFF: 0 (ref <1.00)

## 2019-12-23 LAB — OSMOLALITY: Osmolality: 281 mOsm/kg (ref 275–295)

## 2019-12-23 LAB — TSH
TSH: 1.12 u[IU]/mL (ref 0.35–4.50)
TSH: 0.783 u[IU]/mL (ref 0.350–4.500)

## 2019-12-23 LAB — PHOSPHORUS: Phosphorus: 3 mg/dL (ref 2.5–4.6)

## 2019-12-23 LAB — HIV ANTIBODY (ROUTINE TESTING W REFLEX): HIV Screen 4th Generation wRfx: NONREACTIVE

## 2019-12-23 LAB — MAGNESIUM: Magnesium: 2.1 mg/dL (ref 1.7–2.4)

## 2019-12-23 LAB — BRAIN NATRIURETIC PEPTIDE: B Natriuretic Peptide: 326.6 pg/mL — ABNORMAL HIGH (ref 0.0–100.0)

## 2019-12-23 LAB — OSMOLALITY, URINE: Osmolality, Ur: 120 mOsm/kg — ABNORMAL LOW (ref 300–900)

## 2019-12-23 LAB — SODIUM, URINE, RANDOM: Sodium, Ur: 18 mmol/L

## 2019-12-23 LAB — PROCALCITONIN: Procalcitonin: <0.1 ng/mL

## 2019-12-23 MED ORDER — IPRATROPIUM-ALBUTEROL 0.5-2.5 (3) MG/3ML IN SOLN
3.0000 mL | Freq: Two times a day (BID) | RESPIRATORY_TRACT | Status: DC
  Administered 2019-12-23: 3 mL via RESPIRATORY_TRACT
  Filled 2019-12-23 (×2): qty 3

## 2019-12-23 MED ORDER — METOCLOPRAMIDE HCL 5 MG/ML IJ SOLN
10.0000 mg | Freq: Once | INTRAMUSCULAR | Status: AC
  Administered 2019-12-23: 10 mg via INTRAVENOUS
  Filled 2019-12-23: qty 2

## 2019-12-23 MED ORDER — LORAZEPAM 0.5 MG PO TABS
0.5000 mg | ORAL_TABLET | Freq: Four times a day (QID) | ORAL | Status: DC | PRN
  Administered 2019-12-23: 0.5 mg via ORAL
  Filled 2019-12-23: qty 1

## 2019-12-23 MED ORDER — PEG-KCL-NACL-NASULF-NA ASC-C 100 G PO SOLR
0.5000 | Freq: Once | ORAL | Status: AC
  Administered 2019-12-23: 100 g via ORAL
  Filled 2019-12-23: qty 1

## 2019-12-23 MED ORDER — BISACODYL 5 MG PO TBEC
20.0000 mg | DELAYED_RELEASE_TABLET | Freq: Once | ORAL | Status: AC
  Administered 2019-12-23: 20 mg via ORAL
  Filled 2019-12-23: qty 4

## 2019-12-23 MED ORDER — ALBUTEROL SULFATE (2.5 MG/3ML) 0.083% IN NEBU
2.5000 mg | INHALATION_SOLUTION | Freq: Four times a day (QID) | RESPIRATORY_TRACT | Status: DC | PRN
  Administered 2019-12-23: 2.5 mg via RESPIRATORY_TRACT
  Filled 2019-12-23: qty 3

## 2019-12-23 MED ORDER — IPRATROPIUM-ALBUTEROL 0.5-2.5 (3) MG/3ML IN SOLN
3.0000 mL | Freq: Four times a day (QID) | RESPIRATORY_TRACT | Status: DC
  Administered 2019-12-23: 3 mL via RESPIRATORY_TRACT
  Filled 2019-12-23 (×2): qty 3

## 2019-12-23 MED ORDER — SODIUM CHLORIDE 0.9 % IV SOLN
510.0000 mg | Freq: Once | INTRAVENOUS | Status: AC
  Administered 2019-12-23: 510 mg via INTRAVENOUS
  Filled 2019-12-23: qty 17

## 2019-12-23 MED ORDER — IOHEXOL 300 MG/ML  SOLN
100.0000 mL | Freq: Once | INTRAMUSCULAR | Status: AC | PRN
  Administered 2019-12-23: 100 mL via INTRAVENOUS

## 2019-12-23 MED ORDER — PEG-KCL-NACL-NASULF-NA ASC-C 100 G PO SOLR: 1.0000 | Freq: Once | ORAL | Status: DC

## 2019-12-23 MED ORDER — PEG-KCL-NACL-NASULF-NA ASC-C 100 G PO SOLR
0.5000 | Freq: Once | ORAL | Status: AC
  Administered 2019-12-23: 100 g via ORAL

## 2019-12-23 MED ORDER — PHENOL 1.4 % MT LIQD: 1.0000 | OROMUCOSAL | Status: DC | PRN

## 2019-12-23 MED ORDER — AMLODIPINE BESYLATE 10 MG PO TABS
10.0000 mg | ORAL_TABLET | Freq: Every day | ORAL | Status: DC
  Administered 2019-12-23 – 2019-12-24 (×2): 10 mg via ORAL
  Filled 2019-12-23 (×2): qty 1

## 2019-12-23 MED ORDER — LISINOPRIL 20 MG PO TABS
20.0000 mg | ORAL_TABLET | Freq: Every day | ORAL | Status: DC
  Administered 2019-12-23 – 2019-12-24 (×2): 20 mg via ORAL
  Filled 2019-12-23 (×2): qty 1

## 2019-12-23 MED ORDER — GUAIFENESIN-DM 100-10 MG/5ML PO SYRP: 5.0000 mL | ORAL_SOLUTION | ORAL | Status: DC | PRN

## 2019-12-23 NOTE — Progress Notes (Signed)
PROGRESS NOTE  Denise [name] TSV:[id number] DOB: 02/08/1961 DOA: 12/22/2019 PCP: Pleas Koch, NP  Brief History   The patient is a 59 yr old woman who presented to her urgent care 10 days with complaints of dyspnea, productive cough, malaise, and fatigue. She was given Augmentin for "possible pneumonia". The patient is a 1/2 pack a day smoker. She was seen in her PCP's office on 12/21/2019. CXR demonstrated a lll nodule, COPD and cardiomegaly. CBC was drawn. It was reported on 12/22/2019 as showing a hemoglobin of 3.9. The patient was called and asked to go to the ED.  The patient presented to [facility] on the evening of 12/22/2019 with complaints of worsening. Hemoglobin on this presentation was 3.7. The patient now recounts that she had one week of dark stool after she was started on augmentin. The CBC obtained most recently that I have access to was 11 in 03/2012. Iron studies were obtained. Fe was low at 9. Saturation ratio was likewise low at 2. Ferritin was 3. The patient has received 3 units of blood and 510 mg of Feraheme. Hemoglobin following her transfusions is 8.4. GI has been consulted. Plan is for EGD and colonoscopy tomorrow.  CXR was obtained that demonstrated  Consultants  . Gastroenterology  Procedures  . Transfusion of 3 units of PRBC's  Antibiotics   Anti-infectives (From admission, onward)   None    .  Subjective  The patient is resting comfortably. No new complaints. She states that she is feeling better. She does have a coarse cough.  Objective   Vitals:  Vitals:   12/23/19 0743 12/23/19 1303  BP: 136/69 133/65  Pulse: 97 92  Resp: 17 17  Temp: 98 F (36.7 C) 98.2 F (36.8 C)  SpO2:  97%   Exam:  Constitutional:  . The patient is awake, alert, and oriented x 3. No acute distress. Respiratory:  . No increased work of breathing. . No wheezes, rales, or rhonchi . No tactile fremitus . Diminished breath sounds throughout. Cardiovascular:  . Regular  rate and rhythm . No murmurs, ectopy, or gallups. . No lateral PMI. No thrills. Abdomen:  . Abdomen is soft, non-tender, non-distended . No hernias, masses, or organomegaly . Normoactive bowel sounds.  Musculoskeletal:  . No cyanosis, clubbing, or edema Skin:  . No rashes, lesions, ulcers . palpation of skin: no induration or nodules Neurologic:  . CN 2-12 intact . Sensation all 4 extremities intact Psychiatric:  . Mental status o Mood, affect appropriate o Orientation to person, place, time  . judgment and insight appear intact  I have personally reviewed the following:   Today's Data  . Vitals, CBC, BMP, Iron studies  Imaging  . CXR  Scheduled Meds: . sodium chloride   Intravenous Once  . docusate sodium  100 mg Oral BID  . ipratropium-albuterol  3 mL Nebulization BID  . metoCLOPramide (REGLAN) injection  10 mg Intravenous Once   Followed by  . metoCLOPramide (REGLAN) injection  10 mg Intravenous Once  . peg 3350 powder  0.5 kit Oral Once   And  . peg 3350 powder  0.5 kit Oral Once  . sodium chloride flush  3 mL Intravenous Q12H  . sodium chloride flush  3 mL Intravenous Q12H   Continuous Infusions: . sodium chloride      Active Problems:   Hypertension   Chest pressure   Symptomatic anemia   Cardiomegaly   Tobacco abuse   Hyponatremia   COPD (chronic obstructive  pulmonary disease) ([place])   LOS: 0 days  Severe symptomatic anemia: Hemoglobin 3.7 at presentation. Pt symptomatic with chest pain, dyspnea on exertion, The patient has received 3 units of PRBC's and 510 mg of Feraheme. She reports one week of dark stools while taking augmentin. Heme negative in the ED. GI has been consulted. They plan on EGD, colonoscopy in the morning. Hemolgobin 8.4 after transfusions.  Hypertension: Continue amlodipine and lisinopril as at home.  COPD: per CXR. Pt continues to smoke. Will make beta agonists available on a prn basis.  Tobacco abuse: nicotine patch to be  available.  Pulmonary nodule: 1.2 cm in superior segment of left lower lobe.  I have seen and examined this patient myself. I have spent 35 minutes in her evaluation and care.  DVT Prophylaxis: SCD's CODE STATUS: Full Code Family Communication: None available Disposition: Patient is from home. Anticipate discharge to home. Barriers to discharge: EGD and Colonoscopy tomorrow and monitoring of Hgb. Evaluation for other causes of severe anemia.  Brittiney Dicostanzo, DO

## 2019-12-23 NOTE — H&P (View-Only) (Signed)
[facility] Consult: 10:18 AM 12/23/2019  LOS: 0 days    Referring Provider: Dr Gerri Lins  Primary Care Physician:  Doreene Nest, NP Primary Gastroenterologist:  none    Reason for Consultation:  Anemia.     HPI: Denise [name] is a 59 y.o. female.  PMH obesity. HTN.  Heart murmer.   Lap chole 2013.  Tubal ligation.   Went to urgent care 10 days ago on a Saturday for dyspnea, productive cough, malaise, fatigue.  She did not have an x-ray but was diagnosed with possible pneumonia vs bronchitis vs sinusitis.  She is 1/2 pack a day smoker.  She was started on Augmentin.  This caused some loosening of her stools and GI upset but no vomiting.  She occasionally gets heartburn if she drinks a lot of carbonated beverages or spicy food.  Patient's cough, fatigue continued and she was seen at PMD office yesterday.  Chest x-ray ordered to evaluate dyspnea.  There was a left lower lobe nodule, COPD, cardiomegaly. Hgb 3.9, MCV 64. Na 125.  Renal function normal.  MD contacted patient and had her go to the ED. Iron is 9, ferritin is 3.  Low iron sats.  B12 and folate okay Stool FOBT negative Chest CT this AM: 1 cm left lower lobe nodule suggest CT in 3 months, follow-up PET or CT, or tissue sampling.  Also seen is opacification in lateral right middle lobe due to infection or atelectasis.  Aortic atherosclerosis, atherosclerotic CAD, emphysema  Awaiting follow-up CBC after completion of 3 PRBCs.  Patient has never had colonoscopy and is leery of having a colonoscopy after experience of her husband who is diagnosed with metastatic what sounds like colon cancer a few years back and there was mixed information provided to the patient and her husband at that time. Patient denies bloody stools, melena, dysphagia.  Her weight  fluctuates in a stable range between 180 and 190 pounds.  Appetite is good.  Patient developed purpura on her arms easily with minor trauma but no major bruising and no excessive bleeding.  No previous blood transfusions or awareness of anemia.  Patient occasionally has a cocktail if she goes out to a restaurant.  Smokes half pack a day.  She and her daughter live in the same home. Family history negative for colorectal disease, anemia, blood loss.      Past Medical History:  Diagnosis Date  . Allergy   . BMI 35.0-35.9,adult 03/31/2012  . Chickenpox   . Cholecystitis with cholelithiasis 03/31/2012  . Heart murmur   . Hypertension 03/31/2012  . Shingles     Past Surgical History:  Procedure Laterality Date  . CHOLECYSTECTOMY  03/30/2012   Procedure: LAPAROSCOPIC CHOLECYSTECTOMY WITH INTRAOPERATIVE CHOLANGIOGRAM;  Surgeon: Mariella Saa, MD;  Location: [facility];  Service: General;  Laterality: N/A;  . TUBAL LIGATION      Prior to Admission medications   Medication Sig Start Date End Date Taking? Authorizing Provider  albuterol (VENTOLIN HFA) 108 (90 Base) MCG/ACT inhaler Inhale 2 puffs into the lungs every  6 (six) hours as needed for wheezing or shortness of breath.   Yes [provider]  amLODipine (NORVASC) 10 MG tablet TAKE 1 TABLET BY MOUTH EVERY DAY FOR BLOOD PRESSURE Patient taking differently: Take 10 mg by mouth daily.  10/26/19  Yes Doreene Nest, NP  Ascorbic Acid (VITAMIN C) 1000 MG tablet Take 1,000 mg by mouth daily.   Yes [provider]  aspirin EC 81 MG tablet Take 81 mg by mouth daily.   Yes [provider]  benzonatate (TESSALON PERLES) 100 MG capsule Take 100 mg by mouth 3 (three) times daily as needed for cough.   Yes [provider]  diphenhydrAMINE HCl (BENADRYL ALLERGY PO) Take 2 capsules by mouth daily as needed (allergy).   Yes [provider]  famotidine (PEPCID) 20 MG tablet Take 20 mg by mouth daily as  needed for heartburn or indigestion.   Yes [provider]  lisinopril (ZESTRIL) 20 MG tablet TAKE 1 TABLET (20 MG TOTAL) BY MOUTH DAILY. FOR BLOOD PRESSURE. Patient taking differently: Take 20 mg by mouth daily.  11/02/19  Yes Doreene Nest, NP  vitamin E (VITAMIN E) 180 MG (400 UNITS) capsule Take 800 Units by mouth daily.   Yes [provider]    Scheduled Meds: . sodium chloride   Intravenous Once  . docusate sodium  100 mg Oral BID  . ipratropium-albuterol  3 mL Nebulization QID  . sodium chloride flush  3 mL Intravenous Q12H  . sodium chloride flush  3 mL Intravenous Q12H   Infusions: . sodium chloride    . ferumoxytol     PRN Meds: sodium chloride, acetaminophen **OR** acetaminophen, albuterol, famotidine, guaiFENesin-dextromethorphan, HYDROcodone-acetaminophen, LORazepam, ondansetron **OR** ondansetron (ZOFRAN) IV, phenol, sodium chloride flush   Allergies as of 12/22/2019 - Review Complete 12/22/2019  Allergen Reaction Noted  . Codeine Itching 03/29/2012    Family History  Problem Relation Age of Onset  . Arthritis Mother   . Cervical cancer Mother   . Heart disease Father   . Hypertension Father     Social History   Socioeconomic History  . Marital status: Widowed    Spouse name: Not on file  . Number of children: Not on file  . Years of education: Not on file  . Highest education level: Not on file  Occupational History  . Not on file  Tobacco Use  . Smoking status: Former Smoker    Packs/day: 0.75    Years: 34.00    Pack years: 25.50    Types: Cigarettes    Quit date: 05/07/2019    Years since quitting: 0.6  . Smokeless tobacco: Never Used  Substance and Sexual Activity  . Alcohol use: No  . Drug use: No  . Sexual activity: Never    Birth control/protection: None, Post-menopausal  Other Topics Concern  . Not on file  Social History Narrative   Widow.    2 children.   Works in Clinical biochemist.   Enjoys reading, cooking.     Social Determinants of Health   Financial Resource Strain:   . Difficulty of Paying Living Expenses:   Food Insecurity:   . Worried About Programme researcher, broadcasting/film/video in the Last Year:   . Barista in the Last Year:   Transportation Needs:   . Freight forwarder (Medical):   Marland Kitchen Lack of Transportation (Non-Medical):   Physical Activity:   . Days of Exercise per Week:   . Minutes of  Exercise per Session:   Stress:   . Feeling of Stress :   Social Connections:   . Frequency of Communication with Friends and Family:   . Frequency of Social Gatherings with Friends and Family:   . Attends Religious Services:   . Active Member of Clubs or Organizations:   . Attends Banker Meetings:   Marland Kitchen Marital Status:   Intimate Partner Violence:   . Fear of Current or Ex-Partner:   . Emotionally Abused:   Marland Kitchen Physically Abused:   . Sexually Abused:     REVIEW OF SYSTEMS: Constitutional: Weakness, fatigue. ENT:  No nose bleeds Pulm: Cough and dyspnea. CV:  No palpitations, no LE edema.  Some chest pressure with exertion GU:  No hematuria, no frequency GI: See HPI. Heme: See HPI. Transfusions: None that she recalls Neuro:  No headaches, no peripheral tingling or numbness.  No syncope, no seizures, no dizziness Derm:  No itching, no rash or sores.  Endocrine:  No sweats or chills.  No polyuria or dysuria Immunization: Not queried. Travel:  None beyond local counties in last few months.    PHYSICAL EXAM: Vital signs in last 24 hours: Vitals:   12/23/19 0630 12/23/19 0743  BP: 130/70 136/69  Pulse: 85 97  Resp: 16 17  Temp: 98.2 F (36.8 C) 98 F (36.7 C)  SpO2: 96%    Wt Readings from Last 3 Encounters:  12/23/19 81.2 kg  12/22/19 83.2 kg  01/21/19 85.6 kg    General: Pale, looks chronically unwell.  Old for stated age. Head: No facial asymmetry or swelling.  No signs of head trauma. Eyes: Pale conjunctiva. Ears: Not hard of hearing Nose: No congestion or  discharge Mouth: Tongue midline.  Mucosa pink, moist, clear.  Fair dentition. Neck: No JVD, masses, thyromegaly. Lungs: Productive smoker's type cough.  Raspy smokers voice.  No dyspnea.  Diminished breath sounds on the left side, no rales or rhonchi. Heart: RRR.  No MRG.  S1, S2 present. Abdomen: Soft, not tender, not distended.  No HSM, masses, bruits, hernias.  Active bowel sounds..   Rectal: Not repeated.  DRE yesterday without masses and stool was FOBT negative. Musc/Skeltl: No joint redness, swelling or gross deformities. Extremities: Feet are warm.  No CCE. Neurologic: Fully alert.  Oriented x3.  No confusion.  Fluid speech.  Very detailed historian.  Moves all 4 limbs without tremors or gross weakness.   Skin: No rash, no sores, no telangiectasia. Tattoos: None observed Nodes: No cervical adenopathy Psych: Cooperative, pleasant, somewhat pressured speech and mildly anxious.  Intake/Output from previous day: 05/18 0701 - 05/19 0700 In: 1439 [I.V.:10; Blood:1429] Out: -  Intake/Output this shift: No intake/output data recorded.  LAB RESULTS: Recent Labs    12/22/19 1013 12/22/19 1810 12/22/19 1925  WBC 9.0 8.6 9.7  HGB 3.9 Repeated and verified X2.* 3.7* 3.7*  HCT 14.5* 15.2* 14.9*  PLT 401.0* 396 392   BMET Lab Results  Component Value Date   NA 128 (L) 12/22/2019   NA 125 (L) 12/22/2019   NA 135 01/21/2019   K 4.5 12/22/2019   K 4.7 12/22/2019   K 3.8 01/21/2019   CL 95 (L) 12/22/2019   CL 96 12/22/2019   CL 101 01/21/2019   CO2 22 12/22/2019   CO2 22 12/22/2019   CO2 27 01/21/2019   GLUCOSE 109 (H) 12/22/2019   GLUCOSE 104 (H) 12/22/2019   GLUCOSE 98 01/21/2019   BUN 9 12/22/2019   BUN  6 12/22/2019   BUN 11 01/21/2019   CREATININE 0.67 12/22/2019   CREATININE 0.63 12/22/2019   CREATININE 0.75 01/21/2019   CALCIUM 8.6 (L) 12/22/2019   CALCIUM 8.6 12/22/2019   CALCIUM 9.2 01/21/2019   LFT Recent Labs    12/22/19 1013 12/22/19 1810  PROT 6.7  6.2*  ALBUMIN 3.8 3.2*  AST 15 19  ALT 9 12  ALKPHOS 72 68  BILITOT 0.3 0.2*   PT/INR Lab Results  Component Value Date   INR 1.1 12/22/2019   Hepatitis Panel No results for input(s): HEPBSAG, HCVAB, HEPAIGM, HEPBIGM in the last 72 hours. C-Diff No components found for: CDIFF Lipase     Component Value Date/Time   LIPASE 30 03/29/2012 2047    Drugs of Abuse  No results found for: LABOPIA, COCAINSCRNUR, LABBENZ, AMPHETMU, THCU, LABBARB   RADIOLOGY STUDIES: DG Chest 2 View  Result Date: 12/22/2019 CLINICAL DATA:  Cough and dyspnea. Smoker. EXAM: CHEST - 2 VIEW COMPARISON:  03/30/2012 FINDINGS: Mildly enlarged cardiac silhouette with a mild increase in size. Mild hyperexpansion of the lungs with mild peribronchial thickening. Interval 1.2 cm nodule in the superior segment of the left lower lobe. Unremarkable bones. Cholecystectomy clips. IMPRESSION: 1. Interval 1.2 cm nodule in the superior segment of the left lower lobe. Recommend further evaluation with a chest CT with contrast. 2. Mild cardiomegaly and mild changes of COPD and chronic bronchitis. These results will be called to the ordering clinician or representative by the Radiologist Assistant, and communication documented in the PACS or [employer]. Electronically Signed   By: Claudie Revering M.D.   On: 12/22/2019 14:35   CT CHEST W CONTRAST  Result Date: 12/23/2019 CLINICAL DATA:  Recent abnormal chest x-ray demonstrating possible left lung nodule. EXAM: CT CHEST WITH CONTRAST TECHNIQUE: Multidetector CT imaging of the chest was performed during intravenous contrast administration. CONTRAST:  120mL OMNIPAQUE IOHEXOL 300 MG/ML  SOLN COMPARISON:  Chest x-ray 12/22/2019 and 03/30/2012 as well as abdominal CT 03/30/2012 FINDINGS: Cardiovascular: Mild cardiomegaly. Calcified plaque over the left anterior descending coronary artery and right coronary arteries. Thoracic aorta is normal in caliber. Minimal calcified plaque over the  thoracic aorta. Pulmonary arterial system is unremarkable. Remaining vascular structures are unremarkable. Mediastinum/Nodes: No evidence of mediastinal or hilar adenopathy. Remaining mediastinal structures are unremarkable. Lungs/Pleura: Lungs are adequately inflated demonstrate mild to moderate centrilobular emphysematous change. There is a fairly well-defined rounded 1 cm nodule over the superior segment left lower lobe. No other pulmonary nodules/masses. Mild opacification over the lateral aspect of the right middle lobe which may be due to atelectasis or infection. Mild linear density over the posterolateral right base likely atelectasis. No significant effusion. Airways are normal. Upper Abdomen: Few nonspecific subcentimeter lymph nodes over the region of the gastric Paddock ligament. No acute findings. Calcified plaque over the abdominal aorta. Musculoskeletal: Mild degenerative change of the spine. IMPRESSION: 1. 1 cm nodule over the superior segment left lower lobe. Consider one of the following in 3 months for both low-risk and high-risk individuals: (a) repeat chest CT, (b) follow-up PET-CT, or (c) tissue sampling. This recommendation follows the consensus statement: Guidelines for Management of Incidental Pulmonary Nodules Detected on CT Images: From the Fleischner Society 2017; Radiology 2017; 284:228-243. 2. Mild opacification over the lateral right middle lobe and posterolateral right base which may be due to atelectasis or infection. 3. Aortic Atherosclerosis (ICD10-I70.0) and Emphysema (ICD10-J43.9). Atherosclerotic coronary artery disease. Electronically Signed   By: Marin Olp M.D.   On:  12/23/2019 09:55     IMPRESSION:   *    Profound iron deficiency anemia.  1 of 1 stool FOBT negative.  S/p 3 PRBCs  *    Lung nodule.  Emphysema.  ? PNA.   Recent Augmentin for persistent cough,  Fatigue and ? CAP, sinusitis  *     Hyponatremia.    PLAN:     *   Await follow-up Hgb level   *    Will need EGD and colonoscopy, aiming for tomorrow.  Patient is reluctant but has given her consent for the procedure which was explained in detail.  Begin clears now.  *   Is about to begin Feraheme infusion.   Jennye MoccasinSarah Gribbin  12/23/2019, 10:18 AM  ________________________________________________________________________  [facility] MD note:  I personally examined the patient, reviewed the data and agree with the assessment and plan described above.  SEvere IDA without overt GI bleeding and heme negative stool.  Very good bump in Hb with 3 units PRBC.  We are planning colonsocopy and EGD tomorrow.     Rob Buntinganiel Jacobs, MD [facility] Pager [phone]

## 2019-12-23 NOTE — Telephone Encounter (Signed)
Patient aware. She is currently admitted at [facility] and is pending CT chest.

## 2019-12-23 NOTE — Consult Note (Addendum)
[facility] Consult: 10:18 AM 12/23/2019  LOS: 0 days    Referring Provider: Dr Gerri Lins  Primary Care Physician:  Doreene Nest, NP Primary Gastroenterologist:  none    Reason for Consultation:  Anemia.     HPI: Denise [name] is a 59 y.o. female.  PMH obesity. HTN.  Heart murmer.   Lap chole 2013.  Tubal ligation.   Went to urgent care 10 days ago on a Saturday for dyspnea, productive cough, malaise, fatigue.  She did not have an x-ray but was diagnosed with possible pneumonia vs bronchitis vs sinusitis.  She is 1/2 pack a day smoker.  She was started on Augmentin.  This caused some loosening of her stools and GI upset but no vomiting.  She occasionally gets heartburn if she drinks a lot of carbonated beverages or spicy food.  Patient's cough, fatigue continued and she was seen at PMD office yesterday.  Chest x-ray ordered to evaluate dyspnea.  There was a left lower lobe nodule, COPD, cardiomegaly. Hgb 3.9, MCV 64. Na 125.  Renal function normal.  MD contacted patient and had her go to the ED. Iron is 9, ferritin is 3.  Low iron sats.  B12 and folate okay Stool FOBT negative Chest CT this AM: 1 cm left lower lobe nodule suggest CT in 3 months, follow-up PET or CT, or tissue sampling.  Also seen is opacification in lateral right middle lobe due to infection or atelectasis.  Aortic atherosclerosis, atherosclerotic CAD, emphysema  Awaiting follow-up CBC after completion of 3 PRBCs.  Patient has never had colonoscopy and is leery of having a colonoscopy after experience of her husband who is diagnosed with metastatic what sounds like colon cancer a few years back and there was mixed information provided to the patient and her husband at that time. Patient denies bloody stools, melena, dysphagia.  Her weight  fluctuates in a stable range between 180 and 190 pounds.  Appetite is good.  Patient developed purpura on her arms easily with minor trauma but no major bruising and no excessive bleeding.  No previous blood transfusions or awareness of anemia.  Patient occasionally has a cocktail if she goes out to a restaurant.  Smokes half pack a day.  She and her daughter live in the same home. Family history negative for colorectal disease, anemia, blood loss.      Past Medical History:  Diagnosis Date  . Allergy   . BMI 35.0-35.9,adult 03/31/2012  . Chickenpox   . Cholecystitis with cholelithiasis 03/31/2012  . Heart murmur   . Hypertension 03/31/2012  . Shingles     Past Surgical History:  Procedure Laterality Date  . CHOLECYSTECTOMY  03/30/2012   Procedure: LAPAROSCOPIC CHOLECYSTECTOMY WITH INTRAOPERATIVE CHOLANGIOGRAM;  Surgeon: Mariella Saa, MD;  Location: [facility];  Service: General;  Laterality: N/A;  . TUBAL LIGATION      Prior to Admission medications   Medication Sig Start Date End Date Taking? Authorizing Provider  albuterol (VENTOLIN HFA) 108 (90 Base) MCG/ACT inhaler Inhale 2 puffs into the lungs every  6 (six) hours as needed for wheezing or shortness of breath.   Yes [provider]  amLODipine (NORVASC) 10 MG tablet TAKE 1 TABLET BY MOUTH EVERY DAY FOR BLOOD PRESSURE Patient taking differently: Take 10 mg by mouth daily.  10/26/19  Yes Doreene Nest, NP  Ascorbic Acid (VITAMIN C) 1000 MG tablet Take 1,000 mg by mouth daily.   Yes [provider]  aspirin EC 81 MG tablet Take 81 mg by mouth daily.   Yes [provider]  benzonatate (TESSALON PERLES) 100 MG capsule Take 100 mg by mouth 3 (three) times daily as needed for cough.   Yes [provider]  diphenhydrAMINE HCl (BENADRYL ALLERGY PO) Take 2 capsules by mouth daily as needed (allergy).   Yes [provider]  famotidine (PEPCID) 20 MG tablet Take 20 mg by mouth daily as  needed for heartburn or indigestion.   Yes [provider]  lisinopril (ZESTRIL) 20 MG tablet TAKE 1 TABLET (20 MG TOTAL) BY MOUTH DAILY. FOR BLOOD PRESSURE. Patient taking differently: Take 20 mg by mouth daily.  11/02/19  Yes Doreene Nest, NP  vitamin E (VITAMIN E) 180 MG (400 UNITS) capsule Take 800 Units by mouth daily.   Yes [provider]    Scheduled Meds: . sodium chloride   Intravenous Once  . docusate sodium  100 mg Oral BID  . ipratropium-albuterol  3 mL Nebulization QID  . sodium chloride flush  3 mL Intravenous Q12H  . sodium chloride flush  3 mL Intravenous Q12H   Infusions: . sodium chloride    . ferumoxytol     PRN Meds: sodium chloride, acetaminophen **OR** acetaminophen, albuterol, famotidine, guaiFENesin-dextromethorphan, HYDROcodone-acetaminophen, LORazepam, ondansetron **OR** ondansetron (ZOFRAN) IV, phenol, sodium chloride flush   Allergies as of 12/22/2019 - Review Complete 12/22/2019  Allergen Reaction Noted  . Codeine Itching 03/29/2012    Family History  Problem Relation Age of Onset  . Arthritis Mother   . Cervical cancer Mother   . Heart disease Father   . Hypertension Father     Social History   Socioeconomic History  . Marital status: Widowed    Spouse name: Not on file  . Number of children: Not on file  . Years of education: Not on file  . Highest education level: Not on file  Occupational History  . Not on file  Tobacco Use  . Smoking status: Former Smoker    Packs/day: 0.75    Years: 34.00    Pack years: 25.50    Types: Cigarettes    Quit date: 05/07/2019    Years since quitting: 0.6  . Smokeless tobacco: Never Used  Substance and Sexual Activity  . Alcohol use: No  . Drug use: No  . Sexual activity: Never    Birth control/protection: None, Post-menopausal  Other Topics Concern  . Not on file  Social History Narrative   Widow.    2 children.   Works in Clinical biochemist.   Enjoys reading, cooking.     Social Determinants of Health   Financial Resource Strain:   . Difficulty of Paying Living Expenses:   Food Insecurity:   . Worried About Programme researcher, broadcasting/film/video in the Last Year:   . Barista in the Last Year:   Transportation Needs:   . Freight forwarder (Medical):   Marland Kitchen Lack of Transportation (Non-Medical):   Physical Activity:   . Days of Exercise per Week:   . Minutes of  Exercise per Session:   Stress:   . Feeling of Stress :   Social Connections:   . Frequency of Communication with Friends and Family:   . Frequency of Social Gatherings with Friends and Family:   . Attends Religious Services:   . Active Member of Clubs or Organizations:   . Attends Banker Meetings:   Marland Kitchen Marital Status:   Intimate Partner Violence:   . Fear of Current or Ex-Partner:   . Emotionally Abused:   Marland Kitchen Physically Abused:   . Sexually Abused:     REVIEW OF SYSTEMS: Constitutional: Weakness, fatigue. ENT:  No nose bleeds Pulm: Cough and dyspnea. CV:  No palpitations, no LE edema.  Some chest pressure with exertion GU:  No hematuria, no frequency GI: See HPI. Heme: See HPI. Transfusions: None that she recalls Neuro:  No headaches, no peripheral tingling or numbness.  No syncope, no seizures, no dizziness Derm:  No itching, no rash or sores.  Endocrine:  No sweats or chills.  No polyuria or dysuria Immunization: Not queried. Travel:  None beyond local counties in last few months.    PHYSICAL EXAM: Vital signs in last 24 hours: Vitals:   12/23/19 0630 12/23/19 0743  BP: 130/70 136/69  Pulse: 85 97  Resp: 16 17  Temp: 98.2 F (36.8 C) 98 F (36.7 C)  SpO2: 96%    Wt Readings from Last 3 Encounters:  12/23/19 81.2 kg  12/22/19 83.2 kg  01/21/19 85.6 kg    General: Pale, looks chronically unwell.  Old for stated age. Head: No facial asymmetry or swelling.  No signs of head trauma. Eyes: Pale conjunctiva. Ears: Not hard of hearing Nose: No congestion or  discharge Mouth: Tongue midline.  Mucosa pink, moist, clear.  Fair dentition. Neck: No JVD, masses, thyromegaly. Lungs: Productive smoker's type cough.  Raspy smokers voice.  No dyspnea.  Diminished breath sounds on the left side, no rales or rhonchi. Heart: RRR.  No MRG.  S1, S2 present. Abdomen: Soft, not tender, not distended.  No HSM, masses, bruits, hernias.  Active bowel sounds..   Rectal: Not repeated.  DRE yesterday without masses and stool was FOBT negative. Musc/Skeltl: No joint redness, swelling or gross deformities. Extremities: Feet are warm.  No CCE. Neurologic: Fully alert.  Oriented x3.  No confusion.  Fluid speech.  Very detailed historian.  Moves all 4 limbs without tremors or gross weakness.   Skin: No rash, no sores, no telangiectasia. Tattoos: None observed Nodes: No cervical adenopathy Psych: Cooperative, pleasant, somewhat pressured speech and mildly anxious.  Intake/Output from previous day: 05/18 0701 - 05/19 0700 In: 1439 [I.V.:10; Blood:1429] Out: -  Intake/Output this shift: No intake/output data recorded.  LAB RESULTS: Recent Labs    12/22/19 1013 12/22/19 1810 12/22/19 1925  WBC 9.0 8.6 9.7  HGB 3.9 Repeated and verified X2.* 3.7* 3.7*  HCT 14.5* 15.2* 14.9*  PLT 401.0* 396 392   BMET Lab Results  Component Value Date   NA 128 (L) 12/22/2019   NA 125 (L) 12/22/2019   NA 135 01/21/2019   K 4.5 12/22/2019   K 4.7 12/22/2019   K 3.8 01/21/2019   CL 95 (L) 12/22/2019   CL 96 12/22/2019   CL 101 01/21/2019   CO2 22 12/22/2019   CO2 22 12/22/2019   CO2 27 01/21/2019   GLUCOSE 109 (H) 12/22/2019   GLUCOSE 104 (H) 12/22/2019   GLUCOSE 98 01/21/2019   BUN 9 12/22/2019   BUN  6 12/22/2019   BUN 11 01/21/2019   CREATININE 0.67 12/22/2019   CREATININE 0.63 12/22/2019   CREATININE 0.75 01/21/2019   CALCIUM 8.6 (L) 12/22/2019   CALCIUM 8.6 12/22/2019   CALCIUM 9.2 01/21/2019   LFT Recent Labs    12/22/19 1013 12/22/19 1810  PROT 6.7  6.2*  ALBUMIN 3.8 3.2*  AST 15 19  ALT 9 12  ALKPHOS 72 68  BILITOT 0.3 0.2*   PT/INR Lab Results  Component Value Date   INR 1.1 12/22/2019   Hepatitis Panel No results for input(s): HEPBSAG, HCVAB, HEPAIGM, HEPBIGM in the last 72 hours. C-Diff No components found for: CDIFF Lipase     Component Value Date/Time   LIPASE 30 03/29/2012 2047    Drugs of Abuse  No results found for: LABOPIA, COCAINSCRNUR, LABBENZ, AMPHETMU, THCU, LABBARB   RADIOLOGY STUDIES: DG Chest 2 View  Result Date: 12/22/2019 CLINICAL DATA:  Cough and dyspnea. Smoker. EXAM: CHEST - 2 VIEW COMPARISON:  03/30/2012 FINDINGS: Mildly enlarged cardiac silhouette with a mild increase in size. Mild hyperexpansion of the lungs with mild peribronchial thickening. Interval 1.2 cm nodule in the superior segment of the left lower lobe. Unremarkable bones. Cholecystectomy clips. IMPRESSION: 1. Interval 1.2 cm nodule in the superior segment of the left lower lobe. Recommend further evaluation with a chest CT with contrast. 2. Mild cardiomegaly and mild changes of COPD and chronic bronchitis. These results will be called to the ordering clinician or representative by the Radiologist Assistant, and communication documented in the PACS or [employer]. Electronically Signed   By: Claudie Revering M.D.   On: 12/22/2019 14:35   CT CHEST W CONTRAST  Result Date: 12/23/2019 CLINICAL DATA:  Recent abnormal chest x-ray demonstrating possible left lung nodule. EXAM: CT CHEST WITH CONTRAST TECHNIQUE: Multidetector CT imaging of the chest was performed during intravenous contrast administration. CONTRAST:  120mL OMNIPAQUE IOHEXOL 300 MG/ML  SOLN COMPARISON:  Chest x-ray 12/22/2019 and 03/30/2012 as well as abdominal CT 03/30/2012 FINDINGS: Cardiovascular: Mild cardiomegaly. Calcified plaque over the left anterior descending coronary artery and right coronary arteries. Thoracic aorta is normal in caliber. Minimal calcified plaque over the  thoracic aorta. Pulmonary arterial system is unremarkable. Remaining vascular structures are unremarkable. Mediastinum/Nodes: No evidence of mediastinal or hilar adenopathy. Remaining mediastinal structures are unremarkable. Lungs/Pleura: Lungs are adequately inflated demonstrate mild to moderate centrilobular emphysematous change. There is a fairly well-defined rounded 1 cm nodule over the superior segment left lower lobe. No other pulmonary nodules/masses. Mild opacification over the lateral aspect of the right middle lobe which may be due to atelectasis or infection. Mild linear density over the posterolateral right base likely atelectasis. No significant effusion. Airways are normal. Upper Abdomen: Few nonspecific subcentimeter lymph nodes over the region of the gastric Paddock ligament. No acute findings. Calcified plaque over the abdominal aorta. Musculoskeletal: Mild degenerative change of the spine. IMPRESSION: 1. 1 cm nodule over the superior segment left lower lobe. Consider one of the following in 3 months for both low-risk and high-risk individuals: (a) repeat chest CT, (b) follow-up PET-CT, or (c) tissue sampling. This recommendation follows the consensus statement: Guidelines for Management of Incidental Pulmonary Nodules Detected on CT Images: From the Fleischner Society 2017; Radiology 2017; 284:228-243. 2. Mild opacification over the lateral right middle lobe and posterolateral right base which may be due to atelectasis or infection. 3. Aortic Atherosclerosis (ICD10-I70.0) and Emphysema (ICD10-J43.9). Atherosclerotic coronary artery disease. Electronically Signed   By: Marin Olp M.D.   On:  12/23/2019 09:55     IMPRESSION:   *    Profound iron deficiency anemia.  1 of 1 stool FOBT negative.  S/p 3 PRBCs  *    Lung nodule.  Emphysema.  ? PNA.   Recent Augmentin for persistent cough,  Fatigue and ? CAP, sinusitis  *     Hyponatremia.    PLAN:     *   Await follow-up Hgb level   *    Will need EGD and colonoscopy, aiming for tomorrow.  Patient is reluctant but has given her consent for the procedure which was explained in detail.  Begin clears now.  *   Is about to begin Feraheme infusion.   Sarah Gribbin  12/23/2019, 10:18 AM  ________________________________________________________________________  [facility] MD note:  I personally examined the patient, reviewed the data and agree with the assessment and plan described above.  SEvere IDA without overt GI bleeding and heme negative stool.  Very good bump in Hb with 3 units PRBC.  We are planning colonsocopy and EGD tomorrow.     Jyl Chico, MD [facility] Pager [phone]      

## 2019-12-24 ENCOUNTER — Inpatient Hospital Stay (HOSPITAL_COMMUNITY): Payer: 59 | Admitting: Certified Registered"

## 2019-12-24 ENCOUNTER — Telehealth: Payer: Self-pay

## 2019-12-24 ENCOUNTER — Encounter (HOSPITAL_COMMUNITY)
Admission: EM | Disposition: A | Discharge status: Home / Self Care | Payer: Self-pay | Source: Home / Self Care | Attending: Internal Medicine

## 2019-12-24 ENCOUNTER — Encounter (HOSPITAL_COMMUNITY): Payer: Self-pay | Admitting: Internal Medicine

## 2019-12-24 DIAGNOSIS — D649 Anemia, unspecified: Secondary | ICD-10-CM

## 2019-12-24 DIAGNOSIS — K635 Polyp of colon: Secondary | ICD-10-CM

## 2019-12-24 DIAGNOSIS — Q438 Other specified congenital malformations of intestine: Secondary | ICD-10-CM

## 2019-12-24 HISTORY — PX: BIOPSY: SHX5522

## 2019-12-24 HISTORY — PX: POLYPECTOMY: SHX5525

## 2019-12-24 HISTORY — PX: HOT HEMOSTASIS: SHX5433

## 2019-12-24 HISTORY — DX: Anemia, unspecified: D64.9

## 2019-12-24 HISTORY — PX: COLONOSCOPY WITH PROPOFOL: SHX5780

## 2019-12-24 HISTORY — PX: ESOPHAGOGASTRODUODENOSCOPY (EGD) WITH PROPOFOL: SHX5813

## 2019-12-24 LAB — CBC WITH DIFFERENTIAL/PLATELET
Abs Immature Granulocytes: 0.05 10*3/uL (ref 0.00–0.07)
Basophils Absolute: 0.1 10*3/uL (ref 0.0–0.1)
Basophils Relative: 1 %
Eosinophils Absolute: 0.1 10*3/uL (ref 0.0–0.5)
Eosinophils Relative: 1 %
HCT: 25.9 % — ABNORMAL LOW (ref 36.0–46.0)
Hemoglobin: 7.5 g/dL — ABNORMAL LOW (ref 12.0–15.0)
Immature Granulocytes: 1 %
Lymphocytes Relative: 14 %
Lymphs Abs: 1.1 10*3/uL (ref 0.7–4.0)
MCH: 21.4 pg — ABNORMAL LOW (ref 26.0–34.0)
MCHC: 29 g/dL — ABNORMAL LOW (ref 30.0–36.0)
MCV: 74 fL — ABNORMAL LOW (ref 80.0–100.0)
Monocytes Absolute: 0.8 10*3/uL (ref 0.1–1.0)
Monocytes Relative: 10 %
Neutro Abs: 5.8 10*3/uL (ref 1.7–7.7)
Neutrophils Relative %: 73 %
Platelets: 270 10*3/uL (ref 150–400)
RBC: 3.5 MIL/uL — ABNORMAL LOW (ref 3.87–5.11)
RDW: 27.8 % — ABNORMAL HIGH (ref 11.5–15.5)
WBC: 7.9 10*3/uL (ref 4.0–10.5)
nRBC: 0.5 % — ABNORMAL HIGH (ref 0.0–0.2)

## 2019-12-24 LAB — TYPE AND SCREEN
ABO/RH(D): O POS
Antibody Screen: NEGATIVE
Unit division: 0
Unit division: 0
Unit division: 0

## 2019-12-24 LAB — BASIC METABOLIC PANEL
Anion gap: 9 (ref 5–15)
BUN: <5 mg/dL — ABNORMAL LOW (ref 6–20)
CO2: 21 mmol/L — ABNORMAL LOW (ref 22–32)
Calcium: 8.7 mg/dL — ABNORMAL LOW (ref 8.9–10.3)
Chloride: 105 mmol/L (ref 98–111)
Creatinine, Ser: 0.61 mg/dL (ref 0.44–1.00)
GFR calc Af Amer: >60 mL/min (ref >60)
GFR calc non Af Amer: >60 mL/min (ref >60)
Glucose, Bld: 90 mg/dL (ref 70–99)
Potassium: 3.5 mmol/L (ref 3.5–5.1)
Sodium: 135 mmol/L (ref 135–145)

## 2019-12-24 LAB — BPAM RBC
Blood Product Expiration Date: 202106182359
Blood Product Expiration Date: 202106182359
Blood Product Expiration Date: 202106182359
ISSUE DATE / TIME: 202105181927
ISSUE DATE / TIME: 202105182322
ISSUE DATE / TIME: 202105190334
Unit Type and Rh: 5100
Unit Type and Rh: 5100
Unit Type and Rh: 5100

## 2019-12-24 LAB — PROCALCITONIN: Procalcitonin: <0.1 ng/mL

## 2019-12-24 SURGERY — ESOPHAGOGASTRODUODENOSCOPY (EGD) WITH PROPOFOL
Anesthesia: Monitor Anesthesia Care

## 2019-12-24 MED ORDER — PROPOFOL 500 MG/50ML IV EMUL
INTRAVENOUS | Status: DC | PRN
  Administered 2019-12-24: 100 ug/kg/min via INTRAVENOUS

## 2019-12-24 MED ORDER — FERROUS GLUCONATE 324 (38 FE) MG PO TABS
324.0000 mg | ORAL_TABLET | Freq: Every day | ORAL | 0 refills | Status: DC
Start: 2019-12-24 — End: 2021-02-24

## 2019-12-24 MED ORDER — PROPOFOL 10 MG/ML IV BOLUS
INTRAVENOUS | Status: DC | PRN
  Administered 2019-12-24 (×2): 30 mg via INTRAVENOUS
  Administered 2019-12-24: 20 mg via INTRAVENOUS

## 2019-12-24 MED ORDER — IPRATROPIUM-ALBUTEROL 20-100 MCG/ACT IN AERS
INHALATION_SPRAY | RESPIRATORY_TRACT | Status: DC | PRN
  Administered 2019-12-24: 2 via RESPIRATORY_TRACT

## 2019-12-24 MED ORDER — LACTATED RINGERS IV SOLN
INTRAVENOUS | Status: AC | PRN
  Administered 2019-12-24: 1000 mL via INTRAVENOUS

## 2019-12-24 SURGICAL SUPPLY — 25 items

## 2019-12-24 NOTE — Op Note (Signed)
[facility] Patient Name: Denise [name] Procedure Date : 12/24/2019 MRN: [id number] Attending MD: Rachael Fee , MD Date of Birth: [birth year] CSN: [id number] Age: 59 Admit Type: Inpatient Procedure:                Colonoscopy Indications:              Iron deficiency anemia, severe, Hb 3s Providers:                Rachael Fee, MD, Vicki Mallet, RN, Wanita Chamberlain, Technician Referring MD:              Medicines:                Monitored Anesthesia Care Complications:            No immediate complications. Estimated blood loss:                            None. Estimated Blood Loss:     Estimated blood loss: none. Procedure:                Pre-Anesthesia Assessment:                           - Prior to the procedure, a History and Physical                            was performed, and patient medications and                            allergies were reviewed. The patient's tolerance of                            previous anesthesia was also reviewed. The risks                            and benefits of the procedure and the sedation                            options and risks were discussed with the patient.                            All questions were answered, and informed consent                            was obtained. Prior Anticoagulants: The patient has                            taken no previous anticoagulant or antiplatelet                            agents. ASA Grade Assessment: II - A patient with  mild systemic disease. After reviewing the risks                            and benefits, the patient was deemed in                            satisfactory condition to undergo the procedure.                           After obtaining informed consent, the colonoscope                            was passed under direct vision. Throughout the                            procedure, the patient's blood pressure,  pulse, and                            oxygen saturations were monitored continuously. The                            [id number]-HQ190L ([id number] Olympus colonoscope was                            introduced through the anus and advanced to the the                            cecum, identified by appendiceal orifice and                            ileocecal valve. The colonoscopy was performed                            without difficulty. The patient tolerated the                            procedure well. The quality of the bowel                            preparation was good. The ileocecal valve,                            appendiceal orifice, and rectum were photographed. 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 The AVM bled slightly during treatment.      A 7 mm polyp was found in the descending colon. The polyp was sessile.       The polyp was removed with a cold snare. Resection and retrieval were       complete.      1cm semithrombosed external hemorrhoid.      The exam was otherwise without  abnormality on direct and retroflexion       views. Impression:               - One 7 mm polyp in the descending colon, removed                            with a cold snare. Resected and retrieved.                           - A single classic appearing medium sized AVM was                            found in the cecum and treated with APC. The AVM                            bled slightly during treatment.                           - External hemorrhoid.                           - The examination was otherwise normal on direct                            and retroflexion views. Recommendation:           - EGD now. Procedure Code(s):        --- Professional ---                            [phone], Colonoscopy, flexible; with removal of                            tumor(s), polyp(s), or other lesion(s) by snare                            technique Diagnosis Code(s):        --- Professional ---                           K63.5, Polyp of colon                           D50.9, Iron deficiency anemia, unspecified CPT copyright 2019 American Medical Association. All rights reserved. The codes documented in this report are preliminary and upon coder review may  be revised to meet current compliance requirements. Rachael Fee, MD 12/24/2019 8:59:40 AM This report has been signed electronically. Number of Addenda: 0

## 2019-12-24 NOTE — Op Note (Signed)
[facility] Patient Name: Denise [name] Procedure Date : 12/24/2019 MRN: [id number] Attending MD: Milus Banister , MD Date of Birth: September 19, 1960 CSN: [id number] Age: 59 Admit Type: Inpatient Procedure:                Upper GI endoscopy Indications:              Iron deficiency anemia, severe Providers:                Milus Banister, MD, Grace Isaac, RN, Theodora Blow, Technician Referring MD:              Medicines:                Monitored Anesthesia Care Complications:            No immediate complications. Estimated blood loss:                            None. Estimated Blood Loss:     Estimated blood loss: none. Procedure:                Pre-Anesthesia Assessment:                           - Prior to the procedure, a History and Physical                            was performed, and patient medications and                            allergies were reviewed. The patient's tolerance of                            previous anesthesia was also reviewed. The risks                            and benefits of the procedure and the sedation                            options and risks were discussed with the patient.                            All questions were answered, and informed consent                            was obtained. Prior Anticoagulants: The patient has                            taken no previous anticoagulant or antiplatelet                            agents. ASA Grade Assessment: II - A patient with  mild systemic disease. After reviewing the risks                            and benefits, the patient was deemed in                            satisfactory condition to undergo the procedure.                           After obtaining informed consent, the endoscope was                            passed under direct vision. Throughout the                            procedure, the patient's blood pressure,  pulse, and                            oxygen saturations were monitored continuously. The                            [id number]-[id number] ([id number]) Olympus gastroscope was                            introduced through the mouth, and advanced to the                            second part of duodenum. The upper GI endoscopy was                            accomplished without difficulty. The patient                            tolerated the procedure well. Scope In: Scope Out: Findings:      The examined duodenum was normal. Biopsies for histology were taken with       a cold forceps for evaluation of celiac disease.      The exam was otherwise without abnormality. Impression:               - Normal examined duodenum. Biopsied.                           - The examination was otherwise normal. Recommendation:           - She is safe for d/c today. She should take oral                            OTC iron supplment once daily, probably                            indefinitely.                           - I will contact her with the biopsy results from  today's procedures, certainly no evidence of cancer.                           - Please call or page with any further questions or                            concerns. Procedure Code(s):        --- Professional ---                           [phone], Esophagogastroduodenoscopy, flexible,                            transoral; with biopsy, single or multiple Diagnosis Code(s):        --- Professional ---                           D50.9, Iron deficiency anemia, unspecified CPT copyright 2019 American Medical Association. All rights reserved. The codes documented in this report are preliminary and upon coder review may  be revised to meet current compliance requirements. Rachael Fee, MD 12/24/2019 9:02:24 AM This report has been signed electronically. Number of Addenda: 0

## 2019-12-24 NOTE — Interval H&P Note (Signed)
History and Physical Interval Note:  12/24/2019 8:09 AM  Denise [name]  has presented today for surgery, with the diagnosis of anemia.  The various methods of treatment have been discussed with the patient and family. After consideration of risks, benefits and other options for treatment, the patient has consented to  Procedure(s): ESOPHAGOGASTRODUODENOSCOPY (EGD) WITH PROPOFOL (N/A) COLONOSCOPY WITH PROPOFOL (N/A) as a surgical intervention.  The patient's history has been reviewed, patient examined, no change in status, stable for surgery.  I have reviewed the patient's chart and labs.  Questions were answered to the patient's satisfaction.     Rachael Fee

## 2019-12-24 NOTE — Anesthesia Procedure Notes (Signed)
Procedure Name: MAC Date/Time: 12/24/2019 8:30 AM Performed by: Griffin Dakin, CRNA Pre-anesthesia Checklist: Patient identified, Emergency Drugs available, Suction available and Patient being monitored Patient Re-evaluated:Patient Re-evaluated prior to induction Oxygen Delivery Method: Circle system utilized and Nasal cannula Induction Type: IV induction Tube type: Oral Placement Confirmation: positive ETCO2 and breath sounds checked- equal and bilateral Dental Injury: Teeth and Oropharynx as per pre-operative assessment

## 2019-12-24 NOTE — Telephone Encounter (Signed)
1st attempt- Left HIPAA complaint message on voicemail to return my call- need to complete TCM and schedule hospital follow up.

## 2019-12-24 NOTE — Transfer of Care (Signed)
Immediate Anesthesia Transfer of Care Note  Patient: Denise [name]  Procedure(s) Performed: ESOPHAGOGASTRODUODENOSCOPY (EGD) WITH PROPOFOL (N/A ) COLONOSCOPY WITH PROPOFOL (N/A ) HOT HEMOSTASIS (ARGON PLASMA COAGULATION/BICAP) (N/A ) BIOPSY  Patient Location: Endoscopy Unit  Anesthesia Type:MAC  Level of Consciousness: awake, alert  and oriented  Airway & Oxygen Therapy: Patient Spontanous Breathing  Post-op Assessment: Report given to RN and Post -op Vital signs reviewed and stable  Post vital signs: Reviewed and stable  Last Vitals:  Vitals Value Taken Time  BP    Temp    Pulse    Resp    SpO2      Last Pain:  Vitals:   12/24/19 0740  TempSrc: Oral  PainSc: 0-No pain      Patients Stated Pain Goal: 0 (16/10/96 0454)  Complications: No apparent anesthesia complications

## 2019-12-24 NOTE — Anesthesia Preprocedure Evaluation (Addendum)
Anesthesia Evaluation  Patient identified by MRN, date of birth, ID band Patient awake    Reviewed: Allergy & Precautions, H&P , NPO status , Patient's Chart, lab work & pertinent test results  Airway Mallampati: II   Neck ROM: full    Dental   Pulmonary COPD, Patient abstained from smoking., former smoker,    breath sounds clear to auscultation       Cardiovascular hypertension,  Rhythm:regular Rate:Normal     Neuro/Psych    GI/Hepatic   Endo/Other    Renal/GU      Musculoskeletal   Abdominal   Peds  Hematology  (+) Blood dyscrasia, anemia ,   Anesthesia Other Findings   Reproductive/Obstetrics                            Anesthesia Physical Anesthesia Plan  ASA: III  Anesthesia Plan: MAC   Post-op Pain Management:    Induction: Intravenous  PONV Risk Score and Plan: 2 and Propofol infusion and Treatment may vary due to age or medical condition  Airway Management Planned: Nasal Cannula  Additional Equipment:   Intra-op Plan:   Post-operative Plan:   Informed Consent: I have reviewed the patients History and Physical, chart, labs and discussed the procedure including the risks, benefits and alternatives for the proposed anesthesia with the patient or authorized representative who has indicated his/her understanding and acceptance.       Plan Discussed with: CRNA and Anesthesiologist  Anesthesia Plan Comments:         Anesthesia Quick Evaluation

## 2019-12-25 LAB — SURGICAL PATHOLOGY

## 2019-12-25 NOTE — Telephone Encounter (Signed)
Transition Care Management Follow-up Telephone Call  Date of discharge and from where: 12/25/2019, [facility]  How have you been since you were released from the hospital? Patient states that she is doing better just very tired and trying to catch up on some sleep.   Any questions or concerns? No   Items Reviewed:  Did the pt receive and understand the discharge instructions provided? Yes   Medications obtained and verified? Yes   Any new allergies since your discharge? No   Dietary orders reviewed? Yes  Do you have support at home? Yes   Functional Questionnaire: (I = Independent and D = Dependent) ADLs: I  Bathing/Dressing- I  Meal Prep- I  Eating- I  Maintaining continence- I  Transferring/Ambulation- I  Managing Meds- I  Follow up appointments reviewed:   PCP Hospital f/u appt confirmed? Yes  Scheduled to see Vernona Rieger, NP on 12/30/2019 @ 10:20 am.  [facility] f/u appt confirmed? N/A   Are transportation arrangements needed? No   If their condition worsens, is the pt aware to call PCP or go to the Emergency Dept.? Yes  Was the patient provided with contact information for the PCP's office or ED? Yes  Was to pt encouraged to call back with questions or concerns? Yes

## 2019-12-27 NOTE — Discharge Summary (Addendum)
Physician Discharge Summary  Denise [name] HYW:[id number] DOB: 15-Feb-1961 DOA: 12/22/2019  PCP: Doreene Nest, NP  Admit date: 12/22/2019 Discharge date: 12/27/2019  Recommendations for Outpatient Follow-up:  1. Discharge to home 2. Follow up with PCP in 7-10 days. 3. Have CBC checked at PCP visit. 4. GI will contact the patient with biopsy results. 5. Pt will need follow up CT of the lung in 3 months to follow nodule vs PET Scan Vs tissue sampling.  Discharge Diagnoses: Principal diagnosis is #1 1. Severe symptomatic anemia 2. Iron defeciency anemia 3. 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 4. Colonic AVM 5. Sessile 6. Hypertension 7.  COPD 8. Tobacco abuse 9. Pulmonary nodule  Discharge Condition: Fair  Disposition: Home  Diet recommendation: Regular  Filed Weights   12/23/19 0048 12/24/19 0328  Weight: 81.2 kg 77.9 kg    History of present illness:  Presented with dyspnea on the exertion since january  Had labs done today hg 3.7 no recently she have had chest pain with ambulation for the past 1 week. She went to urgent care and her PCP and was told to come to ER Last hg was 11.0   Seven years ago, no hx blood in stool, hemoccult negative No vaginal bleeding denies any GI blood loss. She has not seen a doctor for long time Never had a colonoscopy No hx of severe blood loss, no hx of heavy bleeding Patient reports for the past 2 years she has been eating ice but she never realized that this was related to her anemia.  Hospital Course:  The patient was admitted to a telemetry bed. She was transfused with 3 units of PRBC's and GI was consulted. The patient underwent a CXR and a CT chest. She was found to have COPD as well as a 1.2 cm left lower lobe nodule.  The patient underwent EGD and colonoscopy with GI on 12/24/2019. EGD was unremarkable. Colonoscopy demonstrated a medium sized AVM which was treated with cautery and a sessile polyp in the  descending colon. She was also found to have a semi-thrombosed external hemorrhoid.  Today's assessment: S: The patient is resting comfortably. No new complaints. O: Vitals:  Vitals:   12/24/19 1010 12/24/19 1150  BP: 126/62 (!) 95/54  Pulse: 70 86  Resp: 14 (!) 32  Temp:    SpO2: 98% 99%   Exam:  Constitutional:   The patient is awake, alert, and oriented x 3. No acute distress. Respiratory:   No increased work of breathing.  No wheezes, rales, or rhonchi  No tactile fremitus Cardiovascular:   Regular rate and rhythm  No murmurs, ectopy, or gallups.  No lateral PMI. No thrills. Abdomen:   Abdomen is soft, non-tender, non-distended  No hernias, masses, or organomegaly  Normoactive bowel sounds.  Musculoskeletal:   No cyanosis, clubbing, or edema Skin:   No rashes, lesions, ulcers  palpation of skin: no induration or nodules Neurologic:   CN 2-12 intact  Sensation all 4 extremities intact Psychiatric:   Mental status o Mood, affect appropriate o Orientation to person, place, time   judgment and insight appear intact   Discharge Instructions  Discharge Instructions    Activity as tolerated - No restrictions   Complete by: As directed    Call MD for:   Complete by: As directed    Dark, bloody, or maroon stools.   Call MD for:  difficulty breathing, headache or visual disturbances   Complete by: As  directed    Call MD for:  extreme fatigue   Complete by: As directed    Call MD for:  persistant dizziness or light-headedness   Complete by: As directed    Call MD for:  persistant nausea and vomiting   Complete by: As directed    Diet - low sodium heart healthy   Complete by: As directed    Discharge instructions   Complete by: As directed    Discharge to home Follow up with PCP in 7-10 days. Have CBC checked at PCP visit. GI will contact the patient with biopsy results.   Increase activity slowly   Complete by: As directed       Allergies as of 12/24/2019      Reactions   Codeine Itching      Medication List    TAKE these medications   albuterol 108 (90 Base) MCG/ACT inhaler Commonly known as: VENTOLIN HFA Inhale 2 puffs into the lungs every 6 (six) hours as needed for wheezing or shortness of breath.   amLODipine 10 MG tablet Commonly known as: NORVASC TAKE 1 TABLET BY MOUTH EVERY DAY FOR BLOOD PRESSURE What changed: See the new instructions.   aspirin EC 81 MG tablet Take 81 mg by mouth daily.   BENADRYL ALLERGY PO Take 2 capsules by mouth daily as needed (allergy).   famotidine 20 MG tablet Commonly known as: PEPCID Take 20 mg by mouth daily as needed for heartburn or indigestion.   ferrous gluconate 324 MG tablet Commonly known as: FERGON Take 1 tablet (324 mg total) by mouth daily with breakfast.   lisinopril 20 MG tablet Commonly known as: ZESTRIL TAKE 1 TABLET (20 MG TOTAL) BY MOUTH DAILY. FOR BLOOD PRESSURE. What changed: additional instructions   Tessalon Perles 100 MG capsule Generic drug: benzonatate Take 100 mg by mouth 3 (three) times daily as needed for cough.   vitamin C 1000 MG tablet Take 1,000 mg by mouth daily.   vitamin E 180 MG (400 UNITS) capsule Generic drug: vitamin E Take 800 Units by mouth daily.      Allergies  Allergen Reactions   Codeine Itching    The results of significant diagnostics from this hospitalization (including imaging, microbiology, ancillary and laboratory) are listed below for reference.    Significant Diagnostic Studies: DG Chest 2 View  Result Date: 12/22/2019 CLINICAL DATA:  Cough and dyspnea. Smoker. EXAM: CHEST - 2 VIEW COMPARISON:  03/30/2012 FINDINGS: Mildly enlarged cardiac silhouette with a mild increase in size. Mild hyperexpansion of the lungs with mild peribronchial thickening. Interval 1.2 cm nodule in the superior segment of the left lower lobe. Unremarkable bones. Cholecystectomy clips. IMPRESSION: 1. Interval 1.2 cm  nodule in the superior segment of the left lower lobe. Recommend further evaluation with a chest CT with contrast. 2. Mild cardiomegaly and mild changes of COPD and chronic bronchitis. These results will be called to the ordering clinician or representative by the Radiologist Assistant, and communication documented in the PACS or [employer]. Electronically Signed   By: Claudie Revering M.D.   On: 12/22/2019 14:35   CT CHEST W CONTRAST  Result Date: 12/23/2019 CLINICAL DATA:  Recent abnormal chest x-ray demonstrating possible left lung nodule. EXAM: CT CHEST WITH CONTRAST TECHNIQUE: Multidetector CT imaging of the chest was performed during intravenous contrast administration. CONTRAST:  173mL OMNIPAQUE IOHEXOL 300 MG/ML  SOLN COMPARISON:  Chest x-ray 12/22/2019 and 03/30/2012 as well as abdominal CT 03/30/2012 FINDINGS: Cardiovascular: Mild cardiomegaly. Calcified plaque over  the left anterior descending coronary artery and right coronary arteries. Thoracic aorta is normal in caliber. Minimal calcified plaque over the thoracic aorta. Pulmonary arterial system is unremarkable. Remaining vascular structures are unremarkable. Mediastinum/Nodes: No evidence of mediastinal or hilar adenopathy. Remaining mediastinal structures are unremarkable. Lungs/Pleura: Lungs are adequately inflated demonstrate mild to moderate centrilobular emphysematous change. There is a fairly well-defined rounded 1 cm nodule over the superior segment left lower lobe. No other pulmonary nodules/masses. Mild opacification over the lateral aspect of the right middle lobe which may be due to atelectasis or infection. Mild linear density over the posterolateral right base likely atelectasis. No significant effusion. Airways are normal. Upper Abdomen: Few nonspecific subcentimeter lymph nodes over the region of the gastric Paddock ligament. No acute findings. Calcified plaque over the abdominal aorta. Musculoskeletal: Mild degenerative change of  the spine. IMPRESSION: 1. 1 cm nodule over the superior segment left lower lobe. Consider one of the following in 3 months for both low-risk and high-risk individuals: (a) repeat chest CT, (b) follow-up PET-CT, or (c) tissue sampling. This recommendation follows the consensus statement: Guidelines for Management of Incidental Pulmonary Nodules Detected on CT Images: From the Fleischner Society 2017; Radiology 2017; 284:228-243. 2. Mild opacification over the lateral right middle lobe and posterolateral right base which may be due to atelectasis or infection. 3. Aortic Atherosclerosis (ICD10-I70.0) and Emphysema (ICD10-J43.9). Atherosclerotic coronary artery disease. Electronically Signed   By: Elberta Fortis M.D.   On: 12/23/2019 09:55    Microbiology: Recent Results (from the past 240 hour(s))  SARS Coronavirus 2 by RT PCR (hospital order, performed in [facility] hospital lab) Nasopharyngeal Nasopharyngeal Swab     Status: None   Collection Time: 12/22/19  7:25 PM   Specimen: Nasopharyngeal Swab  Result Value Ref Range Status   SARS Coronavirus 2 NEGATIVE NEGATIVE Final    Comment: (NOTE) SARS-CoV-2 target nucleic acids are NOT DETECTED. The SARS-CoV-2 RNA is generally detectable in upper and lower respiratory specimens during the acute phase of infection. The lowest concentration of SARS-CoV-2 viral copies this assay can detect is 250 copies / mL. A negative result does not preclude SARS-CoV-2 infection and should not be used as the sole basis for treatment or other patient management decisions.  A negative result may occur with improper specimen collection / handling, submission of specimen other than nasopharyngeal swab, presence of viral mutation(s) within the areas targeted by this assay, and inadequate number of viral copies (<250 copies / mL). A negative result must be combined with clinical observations, patient history, and epidemiological information. Fact Sheet for Patients:    BoilerBrush.com.cy Fact Sheet for Healthcare Providers: https://pope.com/ This test is not yet approved or cleared  by the [place] FDA and has been authorized for detection and/or diagnosis of SARS-CoV-2 by FDA under an Emergency Use Authorization (EUA).  This EUA will remain in effect (meaning this test can be used) for the duration of the COVID-19 declaration under Section 564(b)(1) of the Act, 21 U.S.C. section 360bbb-3(b)(1), unless the authorization is terminated or revoked sooner. Performed at [facility], [address] [place], [place] [address]      Labs: Basic Metabolic Panel: Recent Labs  Lab 12/22/19 1013 12/22/19 1810 12/23/19 0956 12/24/19 0326  NA 125* 128* 133* 135  K 4.7 4.5 3.7 3.5  CL 96 95* 101 105  CO2 22 22 22  21*  GLUCOSE 104* 109* 96 90  BUN 6 9 <5* <5*  CREATININE 0.63 0.67 0.59 0.61  CALCIUM 8.6 8.6* 8.7* 8.7*  MG  --   --  2.1  --   PHOS  --   --  3.0  --    Liver Function Tests: Recent Labs  Lab 12/22/19 1013 12/22/19 1810  AST 15 19  ALT 9 12  ALKPHOS 72 68  BILITOT 0.3 0.2*  PROT 6.7 6.2*  ALBUMIN 3.8 3.2*   No results for input(s): LIPASE, AMYLASE in the last 168 hours. No results for input(s): AMMONIA in the last 168 hours. CBC: Recent Labs  Lab 12/22/19 1013 12/22/19 1013 12/22/19 1810 12/22/19 1925 12/23/19 0956 12/23/19 1628 12/24/19 0326  WBC 9.0   < > 8.6 9.7 8.1 9.2 7.9  NEUTROABS 7.0  --   --  7.9* 6.5  --  5.8  HGB 3.9 Repeated and verified X2.*   < > 3.7* 3.7* 8.2* 8.4* 7.5*  HCT 14.5*   < > 15.2* 14.9* 27.4* 28.8* 25.9*  MCV 59.3*   < > 64.1* 63.4* 74.1* 73.5* 74.0*  PLT 401.0*   < > 396 392 324 321 270   < > = values in this interval not displayed.   Cardiac Enzymes: No results for input(s): CKTOTAL, CKMB, CKMBINDEX, TROPONINI in the last 168 hours. BNP: BNP (last 3 results) Recent Labs    12/23/19 0956  BNP 326.6*    ProBNP (last 3  results) Recent Labs    12/22/19 1013  PROBNP 215.0*    CBG: No results for input(s): GLUCAP in the last 168 hours.  Active Problems:   Hypertension   Chest pressure   Symptomatic anemia   Cardiomegaly   Tobacco abuse   Hyponatremia   COPD (chronic obstructive pulmonary disease) (HCC)   Time coordinating discharge: 38 minutes.  Signed:        Deric Bocock, DO [facility]  12/27/2019, 4:07 PM

## 2019-12-30 ENCOUNTER — Encounter: Payer: Self-pay | Admitting: Primary Care

## 2019-12-30 ENCOUNTER — Ambulatory Visit (INDEPENDENT_AMBULATORY_CARE_PROVIDER_SITE_OTHER): Payer: 59 | Admitting: Primary Care

## 2019-12-30 ENCOUNTER — Other Ambulatory Visit: Payer: Self-pay

## 2019-12-30 VITALS — BP 126/82 | HR 85 | Temp 96.1°F | Ht 64.0 in | Wt 175.5 lb

## 2019-12-30 DIAGNOSIS — E871 Hypo-osmolality and hyponatremia: Secondary | ICD-10-CM

## 2019-12-30 DIAGNOSIS — R011 Cardiac murmur, unspecified: Secondary | ICD-10-CM

## 2019-12-30 DIAGNOSIS — R0609 Other forms of dyspnea: Secondary | ICD-10-CM

## 2019-12-30 DIAGNOSIS — IMO0001 Reserved for inherently not codable concepts without codable children: Secondary | ICD-10-CM

## 2019-12-30 DIAGNOSIS — K5520 Angiodysplasia of colon without hemorrhage: Secondary | ICD-10-CM

## 2019-12-30 DIAGNOSIS — K635 Polyp of colon: Secondary | ICD-10-CM

## 2019-12-30 DIAGNOSIS — R0789 Other chest pain: Secondary | ICD-10-CM

## 2019-12-30 DIAGNOSIS — D649 Anemia, unspecified: Secondary | ICD-10-CM | POA: Diagnosis not present

## 2019-12-30 DIAGNOSIS — R0600 Dyspnea, unspecified: Secondary | ICD-10-CM

## 2019-12-30 DIAGNOSIS — R911 Solitary pulmonary nodule: Secondary | ICD-10-CM | POA: Insufficient documentation

## 2019-12-30 LAB — BASIC METABOLIC PANEL
BUN: 8 mg/dL (ref 6–23)
CO2: 26 mEq/L (ref 19–32)
Calcium: 9.4 mg/dL (ref 8.4–10.5)
Chloride: 100 mEq/L (ref 96–112)
Creatinine, Ser: 0.63 mg/dL (ref 0.40–1.20)
GFR: 96.93 mL/min (ref >60.00)
Glucose, Bld: 101 mg/dL — ABNORMAL HIGH (ref 70–99)
Potassium: 4.8 mEq/L (ref 3.5–5.1)
Sodium: 134 mEq/L — ABNORMAL LOW (ref 135–145)

## 2019-12-30 LAB — IBC + FERRITIN
Ferritin: 97.9 ng/mL (ref 10.0–291.0)
Iron: 39 ug/dL — ABNORMAL LOW (ref 42–145)
Saturation Ratios: 9 % — ABNORMAL LOW (ref 20.0–50.0)
Transferrin: 310 mg/dL (ref 212.0–360.0)

## 2019-12-30 LAB — CBC
HCT: 33.4 % — ABNORMAL LOW (ref 36.0–46.0)
Hemoglobin: 10.1 g/dL — ABNORMAL LOW (ref 12.0–15.0)
MCHC: 30.3 g/dL (ref 30.0–36.0)
MCV: 79.5 fl (ref 78.0–100.0)
Platelets: 360 10*3/uL (ref 150.0–400.0)
RBC: 4.21 Mil/uL (ref 3.87–5.11)
RDW: 37.3 % — ABNORMAL HIGH (ref 11.5–15.5)
WBC: 10.2 10*3/uL (ref 4.0–10.5)

## 2019-12-30 NOTE — Assessment & Plan Note (Signed)
Removed and non cancerous.  Will touch base with GI to inquire about next colonoscopy due date.

## 2019-12-30 NOTE — Assessment & Plan Note (Signed)
Recent hospital admission for hemoglobin of 3.7 with symptoms. 3 units of PRBC transfused. AVM repaired during colonoscopy. Sessile polyp removed which was non cancerous.  She appears significantly improved today, she is building up her endurance. She would like to remain out of work until 01/19/20, agree and signed paperwork.  Repeat CBC and iron studies pending. Discussed to take oral iron every other day for constipation side effect. Okay to take Colace.   Will get in touch with GI in regards to recall of colonoscopy given sessile polyp.   Repeat CT chest in 3 months.   Hospital notes, labs, imaging reviewed.

## 2019-12-30 NOTE — Assessment & Plan Note (Signed)
Resolved. Secondary to anemia.

## 2019-12-30 NOTE — Assessment & Plan Note (Signed)
Secondary to anemia, significantly improved.

## 2019-12-30 NOTE — Assessment & Plan Note (Signed)
Repeat BMP pending. 

## 2019-12-30 NOTE — Assessment & Plan Note (Signed)
To LLL on xray and CT chest.  Repeat CT chest due in August 2021, will call patient at that time.

## 2019-12-30 NOTE — Patient Instructions (Signed)
Stop by the lab prior to leaving today. I will notify you of your results once received.   We will call you in three months to repeat your CT of the chest.  It was a pleasure to see you today!

## 2019-12-30 NOTE — Assessment & Plan Note (Signed)
Scheduled for echocardiogram in late June 2021.

## 2019-12-30 NOTE — Assessment & Plan Note (Signed)
Noted and repaired during recent colonoscopy. Asymptomatic. Will touch base with GI regarding any needed follow up.

## 2019-12-30 NOTE — Progress Notes (Signed)
Subjective:    Patient ID: Denise [name], female    DOB: [birth year], 59 y.o.   MRN: [id number]  HPI  This visit occurred during the SARS-CoV-2 public health emergency.  Safety protocols were in place, including screening questions prior to the visit, additional usage of staff PPE, and extensive cleaning of exam room while observing appropriate contact time as indicated for disinfecting solutions.   Denise [name] is a 59 year old female with a history of COPD, hypertension, tobacco abuse, symptomatic anemia who presents today for hospital follow up.  She initially presented to our office on 12/22/19 with complaints of exertional dyspnea, chest pressure, fatigue that began about one week prior. Work up in our office revealed anemia with hemoglobin of 3.7. Xray showed 1.2 cm nodule to left lower lobe. She was referred to the ED for further treatment.  She presented to [facility] on 12/22/19 as instructed and was admitted for treatment and evaluation. During her hospital stay she tested negative for rectal bleeding. She received three units of PRBC. She underwent upper endoscopy which was negative. She also underwent colonoscopy which showed medium sized AVM which was cauterized. Sesslie polyp found in descending colon, and semi thrombosed external hemorrhoid noted. CT chest with 1 cm nodule to LLL, recommendation was for repeat CT chest in 3 months.   She was discharged home on 12/24/19 with recommendations for PCP follow up, repeat CBC, and CT chest in three months, and to start oral iron.  Sessile polyp was negative for high grade dysplasia or malignancy.  Since her discharge home she's feeling much better. She is aware of her CT chest in regards to her nodule. She denies rectal bleeding. She is taking oral iron as prescribed. She did add Colace to her regimen due to constipation.   She has an albuterol inhaler for which she's using infrequently. She is much more active and is walking daily.   BP  Readings from Last 3 Encounters:  12/30/19 126/82  12/24/19 (!) 95/54  12/22/19 120/66     Review of Systems  Constitutional: Negative for fatigue.  Eyes: Negative for visual disturbance.  Respiratory:       Shortness of breath improved significantly   Cardiovascular: Negative for chest pain.  Neurological: Negative for dizziness.       Past Medical History:  Diagnosis Date  . Allergy   . BMI 35.0-35.9,adult 03/31/2012  . Chickenpox   . Cholecystitis with cholelithiasis 03/31/2012  . Heart murmur   . Hypertension 03/31/2012  . Shingles      Social History   Socioeconomic History  . Marital status: Widowed    Spouse name: Not on file  . Number of children: Not on file  . Years of education: Not on file  . Highest education level: Not on file  Occupational History  . Not on file  Tobacco Use  . Smoking status: Former Smoker    Packs/day: 0.75    Years: 34.00    Pack years: 25.50    Types: Cigarettes    Quit date: 05/07/2019    Years since quitting: 0.6  . Smokeless tobacco: Never Used  Substance and Sexual Activity  . Alcohol use: No  . Drug use: No  . Sexual activity: Never    Birth control/protection: None, Post-menopausal  Other Topics Concern  . Not on file  Social History Narrative   Widow.    2 children.   Works in Clinical biochemist.   Enjoys reading, cooking.  Social Determinants of Health   Financial Resource Strain:   . Difficulty of Paying Living Expenses:   Food Insecurity:   . Worried About Programme researcher, broadcasting/film/video in the Last Year:   . Barista in the Last Year:   Transportation Needs:   . Freight forwarder (Medical):   Marland Kitchen Lack of Transportation (Non-Medical):   Physical Activity:   . Days of Exercise per Week:   . Minutes of Exercise per Session:   Stress:   . Feeling of Stress :   Social Connections:   . Frequency of Communication with Friends and Family:   . Frequency of Social Gatherings with Friends and Family:   .  Attends Religious Services:   . Active Member of Clubs or Organizations:   . Attends Banker Meetings:   Marland Kitchen Marital Status:   Intimate Partner Violence:   . Fear of Current or Ex-Partner:   . Emotionally Abused:   Marland Kitchen Physically Abused:   . Sexually Abused:     Past Surgical History:  Procedure Laterality Date  . BIOPSY  12/24/2019   Procedure: BIOPSY;  Surgeon: Rachael Fee, MD;  Location: [facility] ENDOSCOPY;  Service: Endoscopy;;  . CHOLECYSTECTOMY  03/30/2012   Procedure: LAPAROSCOPIC CHOLECYSTECTOMY WITH INTRAOPERATIVE CHOLANGIOGRAM;  Surgeon: Mariella Saa, MD;  Location: [facility] ORS;  Service: General;  Laterality: N/A;  . COLONOSCOPY WITH PROPOFOL N/A 12/24/2019   Procedure: COLONOSCOPY WITH PROPOFOL;  Surgeon: Rachael Fee, MD;  Location: [facility] ENDOSCOPY;  Service: Endoscopy;  Laterality: N/A;  . ESOPHAGOGASTRODUODENOSCOPY (EGD) WITH PROPOFOL N/A 12/24/2019   Procedure: ESOPHAGOGASTRODUODENOSCOPY (EGD) WITH PROPOFOL;  Surgeon: Rachael Fee, MD;  Location: [facility] ENDOSCOPY;  Service: Endoscopy;  Laterality: N/A;  . HOT HEMOSTASIS N/A 12/24/2019   Procedure: HOT HEMOSTASIS (ARGON PLASMA COAGULATION/BICAP);  Surgeon: Rachael Fee, MD;  Location: [facility] ENDOSCOPY;  Service: Endoscopy;  Laterality: N/A;  . POLYPECTOMY  12/24/2019   Procedure: POLYPECTOMY;  Surgeon: Rachael Fee, MD;  Location: [facility] ENDOSCOPY;  Service: Endoscopy;;  . TUBAL LIGATION      Family History  Problem Relation Age of Onset  . Arthritis Mother   . Cervical cancer Mother   . Heart disease Father   . Hypertension Father     Allergies  Allergen Reactions  . Codeine Itching    Current Outpatient Medications on File Prior to Visit  Medication Sig Dispense Refill  . albuterol (VENTOLIN HFA) 108 (90 Base) MCG/ACT inhaler Inhale 2 puffs into the lungs every 6 (six) hours as needed for wheezing or shortness of breath.    Marland Kitchen amLODipine (NORVASC) 10 MG tablet TAKE 1 TABLET BY MOUTH EVERY DAY FOR BLOOD  PRESSURE (Patient taking differently: Take 10 mg by mouth daily. ) 90 tablet 1  . Ascorbic Acid (VITAMIN C) 1000 MG tablet Take 1,000 mg by mouth daily.    Marland Kitchen aspirin EC 81 MG tablet Take 81 mg by mouth daily.    . benzonatate (TESSALON PERLES) 100 MG capsule Take 100 mg by mouth 3 (three) times daily as needed for cough.    . diphenhydrAMINE HCl (BENADRYL ALLERGY PO) Take 2 capsules by mouth daily as needed (allergy).    . famotidine (PEPCID) 20 MG tablet Take 20 mg by mouth daily as needed for heartburn or indigestion.    . ferrous gluconate (FERGON) 324 MG tablet Take 1 tablet (324 mg total) by mouth daily with breakfast. 30 tablet 0  . lisinopril (ZESTRIL) 20 MG tablet TAKE  1 TABLET (20 MG TOTAL) BY MOUTH DAILY. FOR BLOOD PRESSURE. (Patient taking differently: Take 20 mg by mouth daily. ) 90 tablet 0  . vitamin E (VITAMIN E) 180 MG (400 UNITS) capsule Take 800 Units by mouth daily.     No current facility-administered medications on file prior to visit.    BP 126/82   Pulse 85   Temp (!) 96.1 F (35.6 C) (Temporal)   Ht 5\' 4"  (1.626 m)   Wt 175 lb 8 oz (79.6 kg)   SpO2 98%   BMI 30.12 kg/m    Objective:   Physical Exam  Constitutional: She appears well-nourished.  Appears much improved   Cardiovascular: Normal rate and regular rhythm.  Murmur heard. Respiratory: Effort normal and breath sounds normal.  Musculoskeletal:     Cervical back: Neck supple.  Skin: Skin is warm and dry. No pallor.  Psychiatric: She has a normal mood and affect.           Assessment & Plan:

## 2019-12-31 ENCOUNTER — Other Ambulatory Visit: Payer: Self-pay

## 2019-12-31 DIAGNOSIS — K5520 Angiodysplasia of colon without hemorrhage: Secondary | ICD-10-CM

## 2020-01-05 NOTE — Anesthesia Postprocedure Evaluation (Signed)
Anesthesia Post Note  Patient: Denise [name]  Procedure(s) Performed: ESOPHAGOGASTRODUODENOSCOPY (EGD) WITH PROPOFOL (N/A ) COLONOSCOPY WITH PROPOFOL (N/A ) HOT HEMOSTASIS (ARGON PLASMA COAGULATION/BICAP) (N/A ) BIOPSY POLYPECTOMY     Patient location during evaluation: Endoscopy Anesthesia Type: MAC Level of consciousness: awake and alert Pain management: pain level controlled Vital Signs Assessment: post-procedure vital signs reviewed and stable Respiratory status: spontaneous breathing, nonlabored ventilation, respiratory function stable and patient connected to nasal cannula oxygen Cardiovascular status: stable and blood pressure returned to baseline Postop Assessment: no apparent nausea or vomiting Anesthetic complications: no    Last Vitals:  Vitals:   12/24/19 1010 12/24/19 1150  BP: 126/62 (!) 95/54  Pulse: 70 86  Resp: 14 (!) 32  Temp:    SpO2: 98% 99%    Last Pain:  Vitals:   12/24/19 0920  TempSrc:   PainSc: 0-No pain                 Howie Rufus S

## 2020-01-07 ENCOUNTER — Telehealth: Payer: Self-pay

## 2020-01-07 NOTE — Telephone Encounter (Signed)
Message left for patient to return my call.  

## 2020-01-07 NOTE — Telephone Encounter (Signed)
Pt left v/m; pt had HFU on 12/30/19 and pt has cataracts and wants to know if it is OK to schedule to have cataracts removed. And pt wants to know if Jae Dire thinks pt should get the covid virus.

## 2020-01-07 NOTE — Telephone Encounter (Signed)
Spoken and notified patient of [name] Clark's comments. Patient verbalized understanding.  

## 2020-01-07 NOTE — Telephone Encounter (Signed)
Yes, okay to proceed with cataract removal.  Yes I do believe she should have the Covid-19 vaccine.

## 2020-01-13 ENCOUNTER — Telehealth: Payer: Self-pay | Admitting: Primary Care

## 2020-01-13 ENCOUNTER — Telehealth: Payer: Self-pay | Admitting: *Deleted

## 2020-01-13 ENCOUNTER — Other Ambulatory Visit (INDEPENDENT_AMBULATORY_CARE_PROVIDER_SITE_OTHER): Payer: 59

## 2020-01-13 DIAGNOSIS — K5520 Angiodysplasia of colon without hemorrhage: Secondary | ICD-10-CM

## 2020-01-13 LAB — CBC WITH DIFFERENTIAL/PLATELET
Basophils Absolute: 0.2 10*3/uL — ABNORMAL HIGH (ref 0.0–0.1)
Basophils Relative: 2.3 % (ref 0.0–3.0)
Eosinophils Absolute: 0.3 10*3/uL (ref 0.0–0.7)
Eosinophils Relative: 3.1 % (ref 0.0–5.0)
HCT: 39.8 % (ref 36.0–46.0)
Hemoglobin: 12.7 g/dL (ref 12.0–15.0)
Lymphocytes Relative: 18.7 % (ref 12.0–46.0)
Lymphs Abs: 1.8 10*3/uL (ref 0.7–4.0)
MCHC: 32 g/dL (ref 30.0–36.0)
MCV: 85.7 fl (ref 78.0–100.0)
Monocytes Absolute: 0.7 10*3/uL (ref 0.1–1.0)
Monocytes Relative: 7.1 % (ref 3.0–12.0)
Neutro Abs: 6.8 10*3/uL (ref 1.4–7.7)
Neutrophils Relative %: 68.8 % (ref 43.0–77.0)
Platelets: 666 10*3/uL — ABNORMAL HIGH (ref 150.0–400.0)
RBC: 4.64 Mil/uL (ref 3.87–5.11)
RDW: 36 % — ABNORMAL HIGH (ref 11.5–15.5)
WBC: 9.8 10*3/uL (ref 4.0–10.5)

## 2020-01-13 NOTE — Telephone Encounter (Signed)
Patient called stating that she needs some more paperwork completed by Mayra Reel NP for her leave at work. Advised patient that she can drop it off and we will let her know when it is ready for pickup.

## 2020-01-13 NOTE — Telephone Encounter (Signed)
Place paperwork in folder and in [place]'s inbox to review

## 2020-01-13 NOTE — Telephone Encounter (Signed)
Pt dropped off form to be completed by Jae Dire. Placed in rx tower in front office.

## 2020-01-14 NOTE — Telephone Encounter (Signed)
Faxed and sent a copy for scan. I also placed a copy at the front for pt to pick up.

## 2020-01-14 NOTE — Telephone Encounter (Signed)
Completed and placed on Emily's desk.

## 2020-01-15 ENCOUNTER — Other Ambulatory Visit: Payer: Self-pay

## 2020-01-15 DIAGNOSIS — K5520 Angiodysplasia of colon without hemorrhage: Secondary | ICD-10-CM

## 2020-01-29 ENCOUNTER — Ambulatory Visit (INDEPENDENT_AMBULATORY_CARE_PROVIDER_SITE_OTHER): Payer: 59

## 2020-01-29 ENCOUNTER — Other Ambulatory Visit: Payer: Self-pay

## 2020-01-29 DIAGNOSIS — R0789 Other chest pain: Secondary | ICD-10-CM | POA: Diagnosis not present

## 2020-01-29 DIAGNOSIS — R0600 Dyspnea, unspecified: Secondary | ICD-10-CM | POA: Diagnosis not present

## 2020-01-29 DIAGNOSIS — R011 Cardiac murmur, unspecified: Secondary | ICD-10-CM | POA: Diagnosis not present

## 2020-01-29 DIAGNOSIS — R0609 Other forms of dyspnea: Secondary | ICD-10-CM

## 2020-02-11 ENCOUNTER — Encounter: Payer: Self-pay | Admitting: *Deleted

## 2020-03-21 ENCOUNTER — Other Ambulatory Visit: Payer: Self-pay | Admitting: Primary Care

## 2020-03-27 ENCOUNTER — Telehealth: Payer: Self-pay | Admitting: Primary Care

## 2020-03-27 NOTE — Telephone Encounter (Addendum)
-----   Message from Doreene Nest, NP sent at 12/30/2019 10:45 AM EDT ----- Regarding: CT chest Due Due for repeat CT chest. Is she agreeable? If so the I will order.

## 2020-03-28 ENCOUNTER — Ambulatory Visit: Payer: 59 | Admitting: Gastroenterology

## 2020-03-28 NOTE — Telephone Encounter (Signed)
Message left for patient to return my call.  

## 2020-03-30 NOTE — Telephone Encounter (Signed)
Noted, will contact again in January 2022.

## 2020-03-30 NOTE — Telephone Encounter (Signed)
Spoken to patient and she stated that she does not want to do it right. She does not have enough vacation time due being out so much a while ago. Suggested to be contact again in the beginning of the year.

## 2020-04-24 ENCOUNTER — Other Ambulatory Visit: Payer: Self-pay | Admitting: Primary Care

## 2020-04-24 DIAGNOSIS — I10 Essential (primary) hypertension: Secondary | ICD-10-CM

## 2020-05-29 IMAGING — CT CT CHEST W/ CM
2 of 3 series · 15 of 36 positions shown, 18 images · IV contrast (omnipaque)
Comparison: Chest x-ray 12/22/2019 and 03/30/2012 as well as
abdominal CT 03/30/2012

CLINICAL DATA: Recent abnormal chest x-ray demonstrating possible
left lung nodule.

EXAM:
CT CHEST WITH CONTRAST
TECHNIQUE: Multidetector CT imaging of the chest was performed during
intravenous contrast administration.
CONTRAST:  100mL OMNIPAQUE IOHEXOL 300 MG/ML  SOLN

[Series 3: thorax 2.0 i31f 2 · axial · 0.81mm/px · z∈[+1182,+1432]mm · 12 of 147 slices shown, 15 images]
[im 11/147  mediastinal]
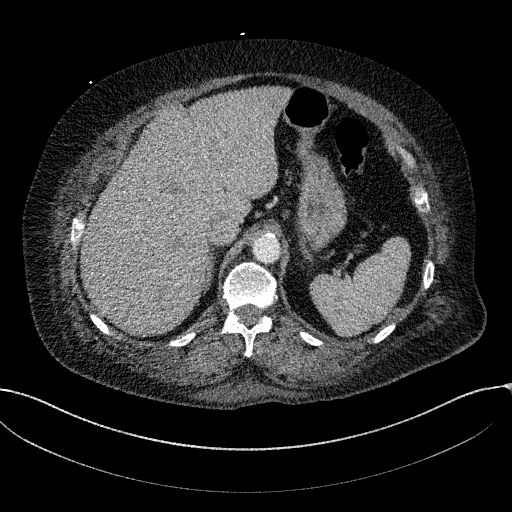
[im 11/147  lung]
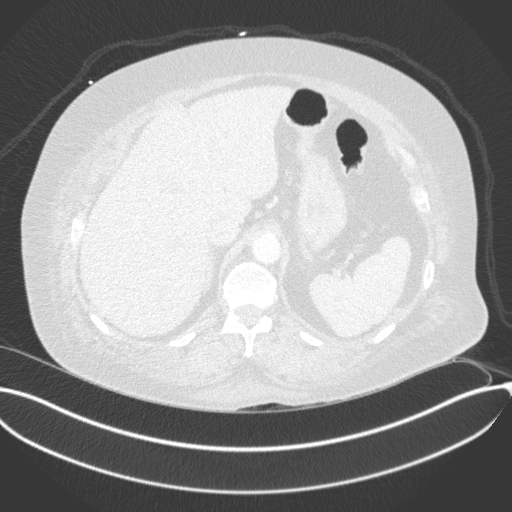
[im 22/147  lung]
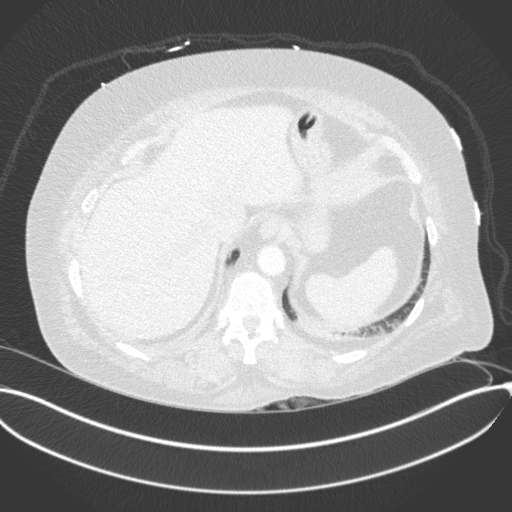
[im 33/147  lung]
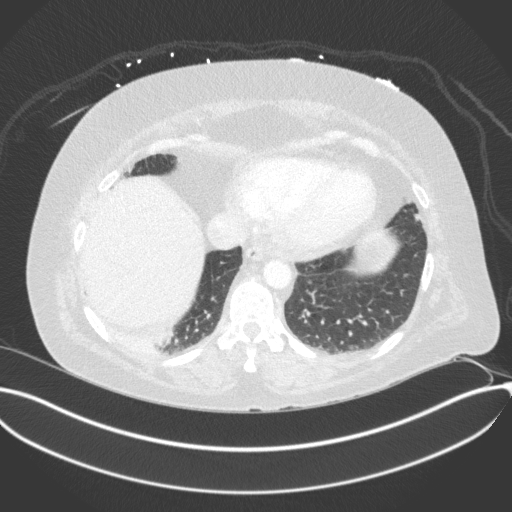
[im 44/147  lung]
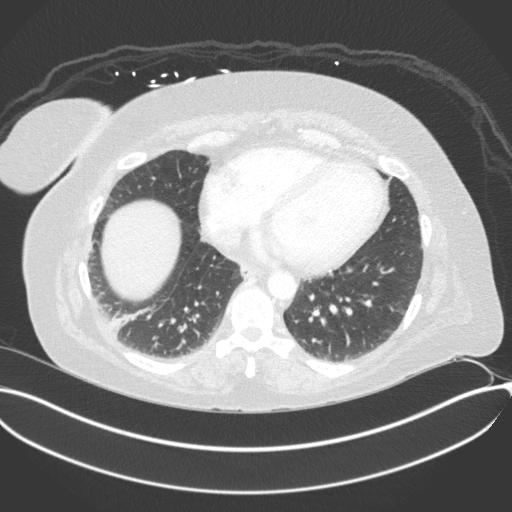
[im 55/147  mediastinal]
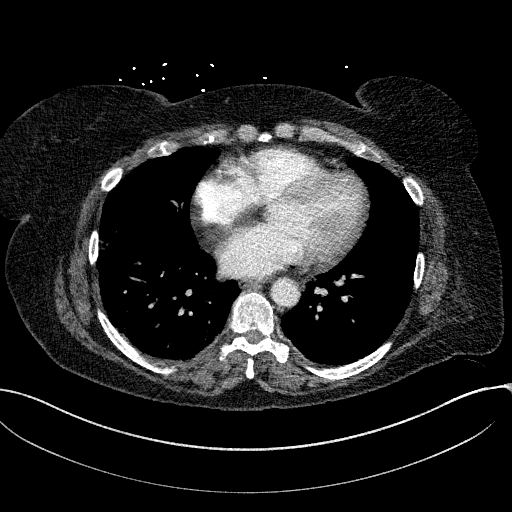
[im 55/147  lung]
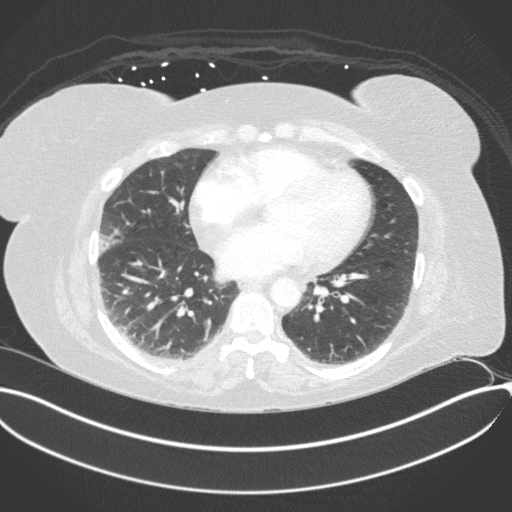
[im 65/147  lung]
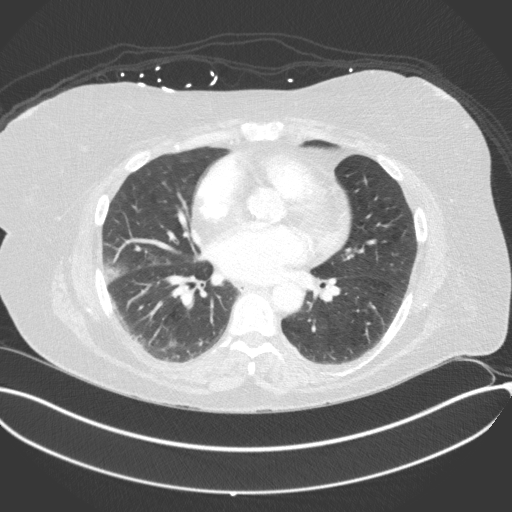
[im 82/147  lung]
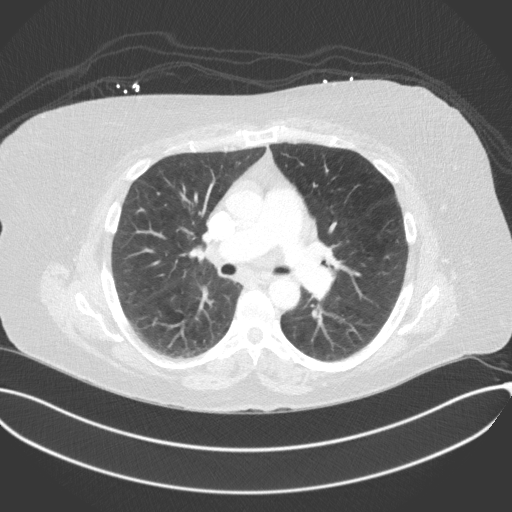
[im 92/147  lung]
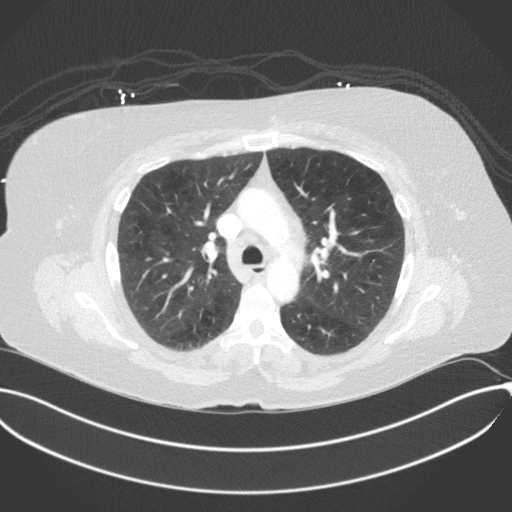
[im 103/147  mediastinal]
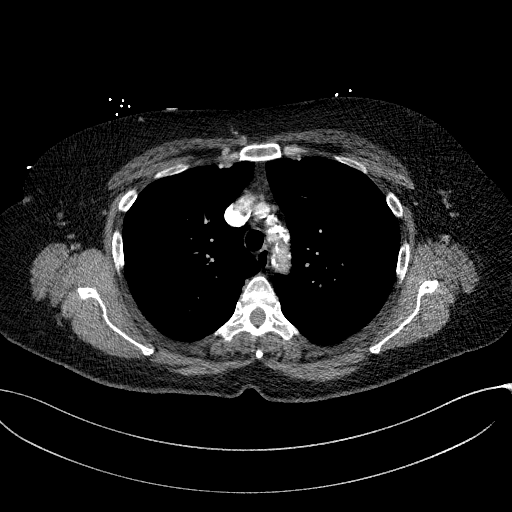
[im 103/147  lung]
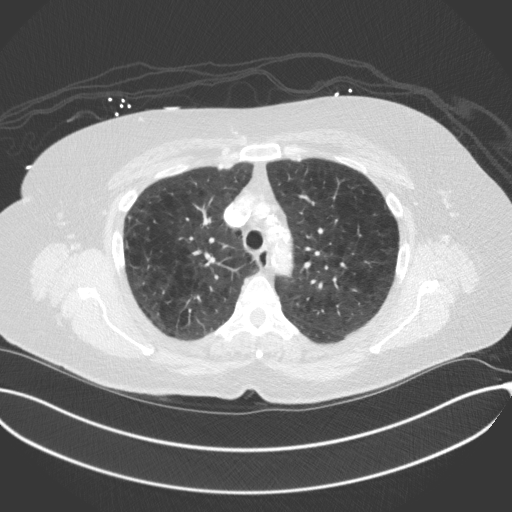
[im 114/147  lung]
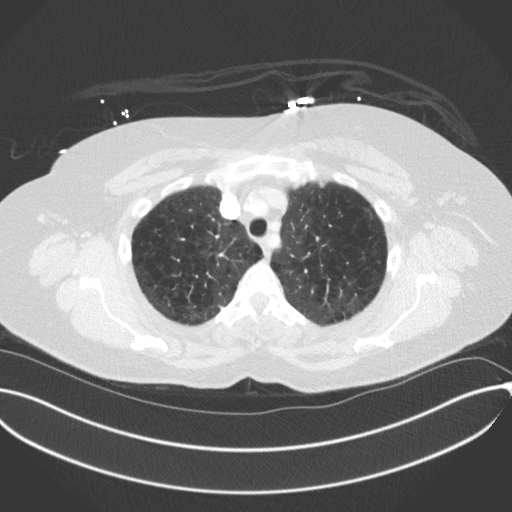
[im 125/147  lung]
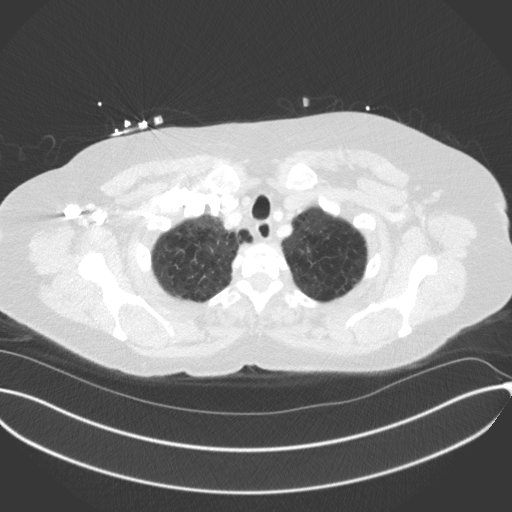
[im 136/147  lung]
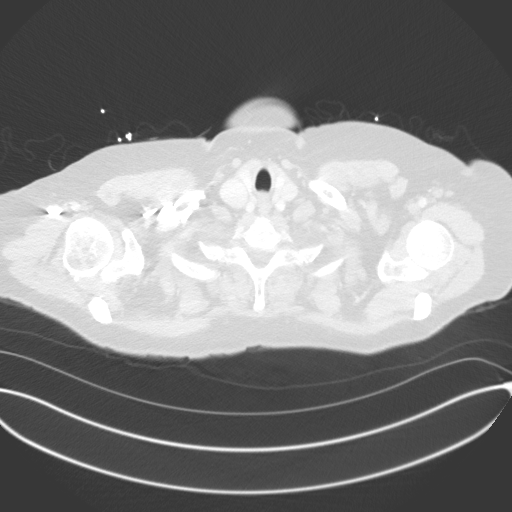

[Series 6: coronal · coronal · 0.58mm/px · 3 of 151 slices shown]
[im 31/151  lung]
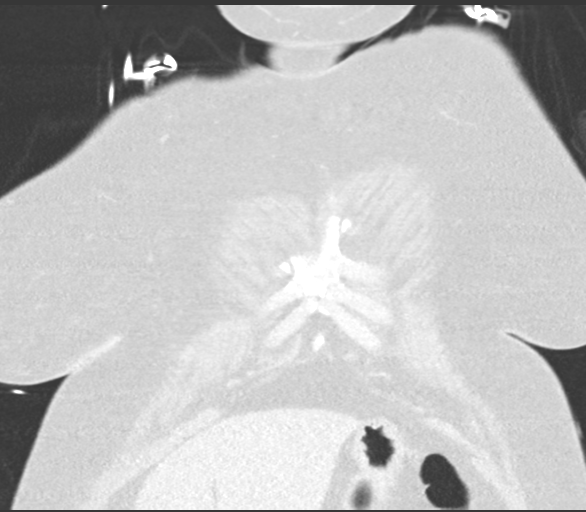
[im 61/151  lung]
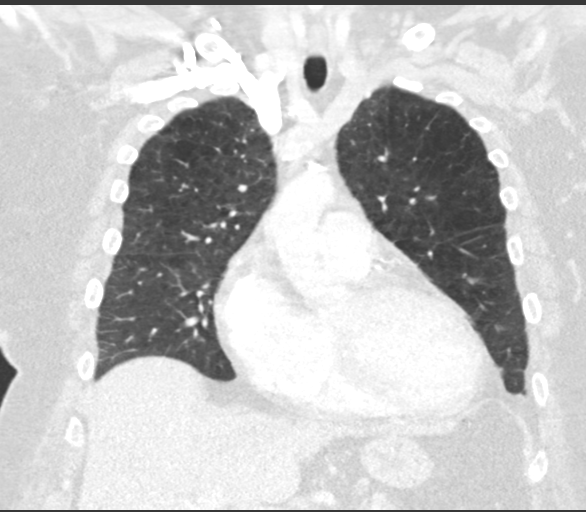
[im 91/151  lung]
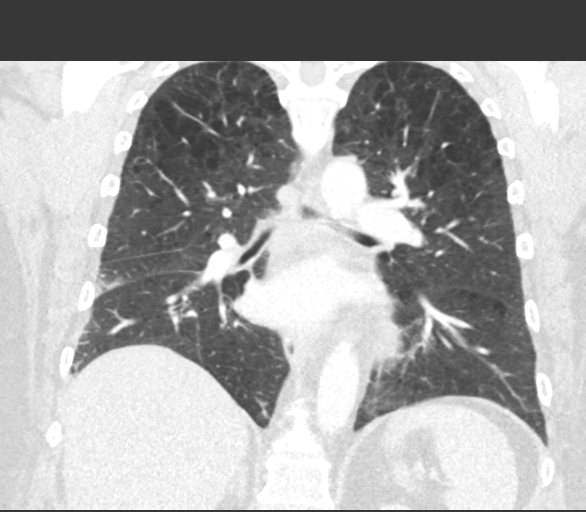

[15 of 36 positions shown; findings below may reference images not displayed]

FINDINGS: Cardiovascular: Mild cardiomegaly. Calcified plaque over the left
anterior descending coronary artery and right coronary arteries.
Thoracic aorta is normal in caliber. Minimal calcified plaque over
the thoracic aorta. Pulmonary arterial system is unremarkable.
Remaining vascular structures are unremarkable.

Mediastinum/Nodes: No evidence of mediastinal or hilar adenopathy.
Remaining mediastinal structures are unremarkable.

Lungs/Pleura: Lungs are adequately inflated demonstrate mild to
moderate centrilobular emphysematous change. There is a fairly
well-defined rounded 1 cm nodule over the superior segment left
lower lobe. No other pulmonary nodules/masses. Mild opacification
over the lateral aspect of the right middle lobe which may be due to
atelectasis or infection. Mild linear density over the
posterolateral right base likely atelectasis. No significant
effusion. Airways are normal.

Upper Abdomen: Few nonspecific subcentimeter lymph nodes over the
region of the gastric Sai ligament. No acute findings. Calcified
plaque over the abdominal aorta.

Musculoskeletal: Mild degenerative change of the spine.
IMPRESSION: 1. 1 cm nodule over the superior segment left lower lobe. Consider
one of the following in 3 months for both low-risk and high-risk
individuals: (a) repeat chest CT, (b) follow-up PET-CT, or (c)
tissue sampling. This recommendation follows the consensus
statement: Guidelines for Management of Incidental Pulmonary Nodules
Detected on CT Images: From the [HOSPITAL] 0131; Radiology
0131; [DATE].

2. Mild opacification over the lateral right middle lobe and
posterolateral right base which may be due to atelectasis or
infection.

3. Aortic Atherosclerosis (O56AC-PQ6.6) and Emphysema (O56AC-CZE.0).
Atherosclerotic coronary artery disease.

## 2020-07-23 ENCOUNTER — Other Ambulatory Visit: Payer: Self-pay | Admitting: Primary Care

## 2020-07-23 DIAGNOSIS — I10 Essential (primary) hypertension: Secondary | ICD-10-CM

## 2020-08-09 ENCOUNTER — Telehealth: Payer: Self-pay | Admitting: Primary Care

## 2020-08-09 NOTE — Telephone Encounter (Signed)
-----   Message from Doreene Nest, NP sent at 03/30/2020  5:15 PM EDT ----- Regarding: Repeat Ct Chest Due Please notify patient that her repeat CT chest overdue. Patient didn't want to have this done in August 2021 when initially due, she requested to be contacted in January 2022.  Is she ready to repeat?

## 2020-08-11 NOTE — Telephone Encounter (Signed)
Left message to return call to our office.  

## 2020-08-12 NOTE — Telephone Encounter (Signed)
Left message to return call to our office.  

## 2020-08-17 NOTE — Telephone Encounter (Signed)
Called and left voicemail for patient to return call to office.  °

## 2020-08-23 ENCOUNTER — Encounter: Payer: Self-pay | Admitting: Primary Care

## 2020-08-23 NOTE — Telephone Encounter (Signed)
Letter printed and mailed to home address in chart.

## 2020-08-23 NOTE — Telephone Encounter (Signed)
Called x 3 no call back. Want me to send letter or keep trying to reach?

## 2020-08-23 NOTE — Telephone Encounter (Signed)
Yes please send letter.

## 2020-10-20 ENCOUNTER — Other Ambulatory Visit: Payer: Self-pay | Admitting: Primary Care

## 2020-10-20 DIAGNOSIS — I10 Essential (primary) hypertension: Secondary | ICD-10-CM

## 2020-10-20 NOTE — Telephone Encounter (Signed)
Patient due in May 2022 for follow up, will need this for further refills.

## 2020-10-20 NOTE — Telephone Encounter (Signed)
Called patient to schedule follow up/cpe. LVM to call back and schedule after 5/26.

## 2020-10-24 NOTE — Telephone Encounter (Signed)
Called patient to schedule cpe. LVM to call back. Letter sent.  

## 2021-01-17 ENCOUNTER — Other Ambulatory Visit: Payer: Self-pay | Admitting: Primary Care

## 2021-01-17 DIAGNOSIS — I10 Essential (primary) hypertension: Secondary | ICD-10-CM

## 2021-01-17 NOTE — Telephone Encounter (Signed)
Office visit required for further refills.  Has not been seen since May 2021. Please schedule.

## 2021-01-18 NOTE — Telephone Encounter (Signed)
Left message to return call to our office.  

## 2021-01-19 NOTE — Telephone Encounter (Signed)
Left message to return call to our office.  

## 2021-01-24 NOTE — Telephone Encounter (Signed)
Yes, okay to send letter.

## 2021-02-08 ENCOUNTER — Encounter: Payer: Self-pay | Admitting: Primary Care

## 2021-02-08 NOTE — Telephone Encounter (Signed)
Letter mailed to address on file.

## 2021-02-19 ENCOUNTER — Other Ambulatory Visit: Payer: Self-pay | Admitting: Primary Care

## 2021-02-19 DIAGNOSIS — I10 Essential (primary) hypertension: Secondary | ICD-10-CM

## 2021-02-24 ENCOUNTER — Other Ambulatory Visit: Payer: Self-pay

## 2021-02-24 ENCOUNTER — Encounter: Payer: Self-pay | Admitting: Primary Care

## 2021-02-24 ENCOUNTER — Ambulatory Visit (INDEPENDENT_AMBULATORY_CARE_PROVIDER_SITE_OTHER): Payer: 59 | Admitting: Primary Care

## 2021-02-24 VITALS — BP 136/80 | HR 110 | Temp 98.1°F | Ht 64.0 in | Wt 175.0 lb

## 2021-02-24 DIAGNOSIS — R911 Solitary pulmonary nodule: Secondary | ICD-10-CM

## 2021-02-24 DIAGNOSIS — Z23 Encounter for immunization: Secondary | ICD-10-CM

## 2021-02-24 DIAGNOSIS — R011 Cardiac murmur, unspecified: Secondary | ICD-10-CM | POA: Diagnosis not present

## 2021-02-24 DIAGNOSIS — Z1231 Encounter for screening mammogram for malignant neoplasm of breast: Secondary | ICD-10-CM | POA: Diagnosis not present

## 2021-02-24 DIAGNOSIS — D500 Iron deficiency anemia secondary to blood loss (chronic): Secondary | ICD-10-CM

## 2021-02-24 DIAGNOSIS — I10 Essential (primary) hypertension: Secondary | ICD-10-CM

## 2021-02-24 DIAGNOSIS — J449 Chronic obstructive pulmonary disease, unspecified: Secondary | ICD-10-CM

## 2021-02-24 DIAGNOSIS — R059 Cough, unspecified: Secondary | ICD-10-CM

## 2021-02-24 DIAGNOSIS — IMO0001 Reserved for inherently not codable concepts without codable children: Secondary | ICD-10-CM

## 2021-02-24 LAB — IBC + FERRITIN
Ferritin: 21.8 ng/mL (ref 10.0–291.0)
Iron: 37 ug/dL — ABNORMAL LOW (ref 42–145)
Saturation Ratios: 8.2 % — ABNORMAL LOW (ref 20.0–50.0)
Transferrin: 322 mg/dL (ref 212.0–360.0)

## 2021-02-24 LAB — COMPREHENSIVE METABOLIC PANEL
ALT: 15 U/L (ref 0–35)
AST: 23 U/L (ref 0–37)
Albumin: 3.8 g/dL (ref 3.5–5.2)
Alkaline Phosphatase: 113 U/L (ref 39–117)
BUN: 6 mg/dL (ref 6–23)
CO2: 26 mEq/L (ref 19–32)
Calcium: 8.9 mg/dL (ref 8.4–10.5)
Chloride: 96 mEq/L (ref 96–112)
Creatinine, Ser: 0.81 mg/dL (ref 0.40–1.20)
GFR: 79.39 mL/min (ref >60.00)
Glucose, Bld: 104 mg/dL — ABNORMAL HIGH (ref 70–99)
Potassium: 3.5 mEq/L (ref 3.5–5.1)
Sodium: 131 mEq/L — ABNORMAL LOW (ref 135–145)
Total Bilirubin: 0.2 mg/dL (ref 0.2–1.2)
Total Protein: 7.1 g/dL (ref 6.0–8.3)

## 2021-02-24 LAB — CBC
HCT: 47.4 % — ABNORMAL HIGH (ref 36.0–46.0)
Hemoglobin: 15.9 g/dL — ABNORMAL HIGH (ref 12.0–15.0)
MCHC: 33.5 g/dL (ref 30.0–36.0)
MCV: 97.6 fl (ref 78.0–100.0)
Platelets: 254 10*3/uL (ref 150.0–400.0)
RBC: 4.86 Mil/uL (ref 3.87–5.11)
RDW: 17.1 % — ABNORMAL HIGH (ref 11.5–15.5)
WBC: 8.5 10*3/uL (ref 4.0–10.5)

## 2021-02-24 LAB — LIPID PANEL
Cholesterol: 210 mg/dL — ABNORMAL HIGH (ref 0–200)
HDL: 36.9 mg/dL — ABNORMAL LOW (ref >39.00)
NonHDL: 173.33
Total CHOL/HDL Ratio: 6
Triglycerides: 205 mg/dL — ABNORMAL HIGH (ref 0.0–149.0)
VLDL: 41 mg/dL — ABNORMAL HIGH (ref 0.0–40.0)

## 2021-02-24 LAB — LDL CHOLESTEROL, DIRECT: Direct LDL: 168 mg/dL

## 2021-02-24 MED ORDER — LISINOPRIL 20 MG PO TABS: 20.0000 mg | ORAL_TABLET | Freq: Every day | ORAL | 3 refills | Status: DC

## 2021-02-24 MED ORDER — ALBUTEROL SULFATE HFA 108 (90 BASE) MCG/ACT IN AERS: INHALATION_SPRAY | RESPIRATORY_TRACT | 0 refills | Status: DC

## 2021-02-24 MED ORDER — AMLODIPINE BESYLATE 10 MG PO TABS: ORAL_TABLET | ORAL | 3 refills | Status: DC

## 2021-02-24 MED ORDER — FERROUS GLUCONATE 324 (38 FE) MG PO TABS: 324.0000 mg | ORAL_TABLET | Freq: Every day | ORAL | 3 refills | Status: DC

## 2021-02-24 NOTE — Assessment & Plan Note (Signed)
Borderline high today, home readings are better. Continue Lisinopril 20 mg and amlodipine 10 mg. Discussed to notify if ACE induced cough becomes bothersome.  CMP pending.

## 2021-02-24 NOTE — Assessment & Plan Note (Signed)
Acute cough since moving furniture around her home. Congested cough noted on exam but with overall clear lungs.  She's improving. Recommended Covid testing if symptoms do not continue to improve. Also consider COPD exacerbation treatment. She will update.  Rx for albuterol inhaler provided.

## 2021-02-24 NOTE — Patient Instructions (Addendum)
Stop by the lab prior to leaving today. I will notify you of your results once received.   Shortness of Breath/Wheezing/Cough: Use the albuterol inhaler. Inhale 2 puffs into the lungs every 4 to 6 hours as needed for wheezing, cough, and/or shortness of breath.   Go get your chest xray at the [facility] outpatient imaging center. No appointment needed.  Call [facility] to schedule your mammogram.   It was a pleasure to see you today!

## 2021-02-24 NOTE — Progress Notes (Signed)
Subjective:    Patient ID: Denise [name], female    DOB: 01/17/1961, 60 y.o.   MRN: [id number]  HPI  Denise [name] is a very pleasant 60 y.o. female with a history of hypertension, cardiomegaly, COPD, tobacco abuse, AVM of colon, anemia who presents today for follow up of chronic conditions and to discuss headache. She is also due for tetanus and mammogram.   Compliant to amlodipine 10 mg and lisinopril 20 mg daily for hypertension. Denies chest pain, dizziness, headaches (except for recent headache). She's checking her BP at home which runs 120's-130's/70's-80's. She does have an occasional cough with a tickle to the throat.   Long history of tobacco abuse, quit in 2020. No albuterol inhaler at home, feels like she could benefit from use of an inhaler recently. CT chest from may 2021 with 1 cm nodule to left lower lobe. It was recommended she undergo repeat imaging 3 months later, however, the patient declined due to cost. She is willing to proceed with xray today.   BP Readings from Last 3 Encounters:  02/24/21 136/80  12/30/19 126/82  12/24/19 (!) 95/54   She's been spring cleaning her home, moving furniture, dust exposure five days ago. Cough began shortly after with congestion, scratchy throat, mild frontal headache. Feeling better today. Has not tested for Covid-19, lives with daughter who works in the school system. She's not had Covid vaccines. She's been taking Delsym and Coricidin.    Review of Systems  Constitutional:  Negative for chills and fever.  Respiratory:  Positive for cough. Negative for wheezing.   Cardiovascular:  Negative for chest pain.  Gastrointestinal:  Negative for blood in stool, constipation and diarrhea.  Allergic/Immunologic: Positive for environmental allergies.  Neurological:  Positive for headaches.        Past Medical History:  Diagnosis Date   Allergy    BMI 35.0-35.9,adult 03/31/2012   Chickenpox    Cholecystitis with  cholelithiasis 03/31/2012   Heart murmur    Hypertension 03/31/2012   Shingles     Social History   Socioeconomic History   Marital status: Widowed    Spouse name: Not on file   Number of children: Not on file   Years of education: Not on file   Highest education level: Not on file  Occupational History   Not on file  Tobacco Use   Smoking status: Former    Packs/day: 0.75    Years: 34.00    Pack years: 25.50    Types: Cigarettes    Quit date: 05/07/2019    Years since quitting: 1.8   Smokeless tobacco: Never  Substance and Sexual Activity   Alcohol use: No   Drug use: No   Sexual activity: Never    Birth control/protection: None, Post-menopausal  Other Topics Concern   Not on file  Social History Narrative   Widow.    2 children.   Works in Clinical biochemist.   Enjoys reading, cooking.    Social Determinants of Health   Financial Resource Strain: Not on file  Food Insecurity: Not on file  Transportation Needs: Not on file  Physical Activity: Not on file  Stress: Not on file  Social Connections: Not on file  Intimate Partner Violence: Not on file    Past Surgical History:  Procedure Laterality Date   BIOPSY  12/24/2019   Procedure: BIOPSY;  Surgeon: Rachael Fee, MD;  Location: [facility] ENDOSCOPY;  Service: Endoscopy;;   CHOLECYSTECTOMY  03/30/2012   Procedure:  LAPAROSCOPIC CHOLECYSTECTOMY WITH INTRAOPERATIVE CHOLANGIOGRAM;  Surgeon: Mariella Saa, MD;  Location: [facility] ORS;  Service: General;  Laterality: N/A;   COLONOSCOPY WITH PROPOFOL N/A 12/24/2019   Procedure: COLONOSCOPY WITH PROPOFOL;  Surgeon: Rachael Fee, MD;  Location: [facility] ENDOSCOPY;  Service: Endoscopy;  Laterality: N/A;   ESOPHAGOGASTRODUODENOSCOPY (EGD) WITH PROPOFOL N/A 12/24/2019   Procedure: ESOPHAGOGASTRODUODENOSCOPY (EGD) WITH PROPOFOL;  Surgeon: Rachael Fee, MD;  Location: [facility] ENDOSCOPY;  Service: Endoscopy;  Laterality: N/A;   HOT HEMOSTASIS N/A 12/24/2019   Procedure: HOT HEMOSTASIS  (ARGON PLASMA COAGULATION/BICAP);  Surgeon: Rachael Fee, MD;  Location: [facility] ENDOSCOPY;  Service: Endoscopy;  Laterality: N/A;   POLYPECTOMY  12/24/2019   Procedure: POLYPECTOMY;  Surgeon: Rachael Fee, MD;  Location: [facility] ENDOSCOPY;  Service: Endoscopy;;   TUBAL LIGATION      Family History  Problem Relation Age of Onset   Arthritis Mother    Cervical cancer Mother    Heart disease Father    Hypertension Father     Allergies  Allergen Reactions   Codeine Itching    Current Outpatient Medications on File Prior to Visit  Medication Sig Dispense Refill   albuterol (VENTOLIN HFA) 108 (90 Base) MCG/ACT inhaler INHALE 2 PUFFS INTO THE LUNGS EVERY 4-6 HOURS AS NEEDED FOR TIGHTNESS/WHEEZING 6.7 g 0   amLODipine (NORVASC) 10 MG tablet TAKE 1 TABLET BY MOUTH EVERY DAY FOR BLOOD PRESSURE 30 tablet 0   Ascorbic Acid (VITAMIN C) 1000 MG tablet Take 1,000 mg by mouth daily.     aspirin EC 81 MG tablet Take 81 mg by mouth daily.     diphenhydrAMINE HCl (BENADRYL ALLERGY PO) Take 2 capsules by mouth daily as needed (allergy).     famotidine (PEPCID) 20 MG tablet Take 20 mg by mouth daily as needed for heartburn or indigestion.     lisinopril (ZESTRIL) 20 MG tablet TAKE 1 TABLET (20 MG TOTAL) BY MOUTH DAILY. FOR BLOOD PRESSURE. 30 tablet 0   vitamin E 180 MG (400 UNITS) capsule Take 800 Units by mouth daily.     ferrous gluconate (FERGON) 324 MG tablet Take 1 tablet (324 mg total) by mouth daily with breakfast. (Patient not taking: Reported on 02/24/2021) 30 tablet 0   No current facility-administered medications on file prior to visit.    BP 136/80   Pulse (!) 110   Temp 98.1 F (36.7 C) (Temporal)   Ht 5\' 4"  (1.626 m)   Wt 175 lb (79.4 kg)   SpO2 95%   BMI 30.04 kg/m  Objective:   Physical Exam Cardiovascular:     Rate and Rhythm: Normal rate and regular rhythm.  Pulmonary:     Effort: Pulmonary effort is normal.     Breath sounds: Normal breath sounds.  Abdominal:      General: Bowel sounds are normal.     Palpations: Abdomen is soft.     Tenderness: There is no abdominal tenderness.  Musculoskeletal:     Cervical back: Neck supple.  Skin:    General: Skin is warm and dry.          Assessment & Plan:      This visit occurred during the SARS-CoV-2 public health emergency.  Safety protocols were in place, including screening questions prior to the visit, additional usage of staff PPE, and extensive cleaning of exam room while observing appropriate contact time as indicated for disinfecting solutions.

## 2021-02-24 NOTE — Assessment & Plan Note (Signed)
Patient declined imaging last year, she agrees to chest xray now.   Ordered chest xray for [facility] Outpatient imaging, provided address and phone number.

## 2021-02-24 NOTE — Assessment & Plan Note (Signed)
Will provide albuterol inhaler to use PRN. Discussed to notify if she uses for prolonged period of time.   She may eventually need LABA/ICS. Continue to monitor.

## 2021-02-24 NOTE — Assessment & Plan Note (Signed)
Echocardiogram from 2021 reviewed. Patient appears asymptomatic. Discussed to update with symptoms if developed.

## 2021-02-27 ENCOUNTER — Other Ambulatory Visit: Payer: Self-pay

## 2021-02-27 DIAGNOSIS — E785 Hyperlipidemia, unspecified: Secondary | ICD-10-CM

## 2021-02-27 MED ORDER — ATORVASTATIN CALCIUM 20 MG PO TABS: ORAL_TABLET | ORAL | 0 refills | Status: DC

## 2021-02-28 ENCOUNTER — Other Ambulatory Visit: Payer: Self-pay | Admitting: Primary Care

## 2021-02-28 DIAGNOSIS — R911 Solitary pulmonary nodule: Secondary | ICD-10-CM

## 2021-03-13 NOTE — Addendum Note (Signed)
Addended by: Donnamarie Poag on: 03/13/2021 09:23 AM   Modules accepted: Orders

## 2021-03-19 ENCOUNTER — Other Ambulatory Visit: Payer: Self-pay | Admitting: Primary Care

## 2021-03-19 DIAGNOSIS — J449 Chronic obstructive pulmonary disease, unspecified: Secondary | ICD-10-CM

## 2021-03-20 NOTE — Telephone Encounter (Signed)
Refill just called in last month. Called patient to see if refill was needed or if auto refill. Left message to return call to our office.

## 2021-03-20 NOTE — Telephone Encounter (Signed)
Returning phone call °

## 2021-03-21 ENCOUNTER — Telehealth: Payer: Self-pay

## 2021-03-21 NOTE — Telephone Encounter (Signed)
Called patient about refill request and she mentioned that she has not received call about CT? Please follow up with patient.

## 2021-03-21 NOTE — Telephone Encounter (Signed)
Left message to return call to our office.  

## 2021-03-21 NOTE — Telephone Encounter (Signed)
Called patient she does not need refill was sent automatic refill by patient. She did mention that she has not received call about CT. Will send message to referral coordinator to follow up on this.

## 2021-03-24 NOTE — Telephone Encounter (Signed)
Pt was made aware yesterday 03/23/21  Appt 04/06/21 at 11am at [facility]  Nothing further needed.

## 2021-04-06 ENCOUNTER — Ambulatory Visit: Admission: RE | Admit: 2021-04-06 | Payer: 59 | Source: Ambulatory Visit

## 2021-04-24 ENCOUNTER — Other Ambulatory Visit: Payer: Self-pay

## 2021-04-24 ENCOUNTER — Ambulatory Visit
Admission: RE | Admit: 2021-04-24 | Discharge: 2021-04-24 | Disposition: A | Discharge status: Home / Self Care | Payer: 59 | Source: Ambulatory Visit | Attending: Primary Care | Admitting: Primary Care

## 2021-04-24 DIAGNOSIS — R911 Solitary pulmonary nodule: Secondary | ICD-10-CM

## 2021-04-25 ENCOUNTER — Encounter: Payer: 59 | Admitting: Primary Care

## 2021-04-25 ENCOUNTER — Other Ambulatory Visit: Payer: Self-pay | Admitting: Primary Care

## 2021-04-25 DIAGNOSIS — J449 Chronic obstructive pulmonary disease, unspecified: Secondary | ICD-10-CM

## 2021-04-26 NOTE — Telephone Encounter (Signed)
How often is she having to use her albuterol inhaler?  For example:   Once weekly, 3 times weekly, once daily, etc.? Received refill request, does she need this?

## 2021-04-27 ENCOUNTER — Other Ambulatory Visit: Payer: Self-pay | Admitting: Primary Care

## 2021-04-27 DIAGNOSIS — R911 Solitary pulmonary nodule: Secondary | ICD-10-CM

## 2021-04-27 NOTE — Telephone Encounter (Signed)
Patient states that in last few weeks with allergies have increased. She has been using 1-2 times a day. She is taking allegra daily. She does need refill

## 2021-05-02 ENCOUNTER — Other Ambulatory Visit: Payer: 59

## 2021-05-25 ENCOUNTER — Telehealth: Payer: Self-pay | Admitting: Primary Care

## 2021-05-25 DIAGNOSIS — R739 Hyperglycemia, unspecified: Secondary | ICD-10-CM

## 2021-05-25 DIAGNOSIS — E785 Hyperlipidemia, unspecified: Secondary | ICD-10-CM

## 2021-05-25 DIAGNOSIS — E871 Hypo-osmolality and hyponatremia: Secondary | ICD-10-CM

## 2021-05-25 NOTE — Telephone Encounter (Signed)
Pt is scheduled with [facility] on 06/06/21 at 1:30, arrive by 1:00pm - check in at Radiology desk [place] ONLY 6 hours prior to test NO Carbs in the meal prior to test  LM for patient to make aware of this appt She will call the office back directly

## 2021-05-25 NOTE — Telephone Encounter (Signed)
Pt called wanting to discuss having a PET scan, she didn't want to schedule an appt til we are back at [facility]

## 2021-05-25 NOTE — Telephone Encounter (Addendum)
There were issues with the precerting of this scan. Had a lot of difficulty getting this submitted via telephone and online.   Finally able to get this submitted with [employer] on 05/22/21 Had to wait on approval from insurance. This was approved.  Message was left for patient to call back to set up time to go have this done.          Referral Notes    Date Time Type Summary User  05/22/2021 3:03 PM EDT Prior Authorization precert approval Simrit Gohlke M  Note Text:  The Case has been Approved. Case/AuthorizationService [id number] Authorization [id number] Auth Effective Date:05/09/2021 Auth End Date:11/05/2021 Initiated Date:05/09/2021 Decision Date:05/13/2021 Case Status:Approved     Date Time Type Summary User  05/09/2021 4:50 PM EDT Prior Authorization pending approval Lauramae Kneisley M  Note Text:  Thank you for submitting your preauthorization request. The case has been sent to [employer] for further review.   If you have any questions please contact [employer] at [phone]. Service HERDE:[id number] Initiated Date:05/03/2021 Case Activity:Submission in Progress Case Status:Pending     Date Time Type Summary User  05/03/2021 1:08 PM EDT General Spoke to tech support with Ludwig Lean, Laisha Rau M  Note Text:  Spoke to tech support with [employer] There was a glitch in the system for this patient and claim for CPT [id number] Will not allow me to find or complete claim in progress They requested the I call to re-initiate claim [id number]     Date Time Type Summary User  04/27/2021 4:44 PM EDT Prior Authorization precert Elisabel Hanover M  Note Text:  Unable to complete Precert online with [employer] - will have to call     Date Time Type Summary User  04/27/2021 2:34 PM EDT Provider Comments Provider Comments Doreene Nest  Note Text:  60 year old female with nodule to left upper lobe, changes since last scan. Radiologist recommended PET scan.   Calling now to  get this schedule.

## 2021-05-25 NOTE — Telephone Encounter (Signed)
Yes, okay to have labs drawn, three labs ordered, one is from July, needs to be collected as well.  Denise [name], let me know what happened with the PET scan.

## 2021-05-25 NOTE — Telephone Encounter (Signed)
Noted, thanks!

## 2021-05-25 NOTE — Telephone Encounter (Signed)
Hi Denise [name], patient called she had order placed for PET scan on 9/22 patient has not received call for app can you help with that?   Denise [name] she wants to know if she can have her labs rechecked in [place]. Told her if you wanted to have labs I will call and set her up in [place]. She wanted to see if any changes in levels after lifestyle changes.

## 2021-05-27 ENCOUNTER — Other Ambulatory Visit: Payer: Self-pay | Admitting: Primary Care

## 2021-05-27 DIAGNOSIS — E785 Hyperlipidemia, unspecified: Secondary | ICD-10-CM

## 2021-05-28 NOTE — Telephone Encounter (Signed)
Patient is overdue for labs that were requested in late July 2022.  Can we set her up for labs at [facility] or [place]? Orders are in.

## 2021-05-29 NOTE — Telephone Encounter (Signed)
Left message to return call to our office.  

## 2021-05-31 NOTE — Telephone Encounter (Signed)
Called x 2 my chart message sent to call for lab appointment.

## 2021-06-01 NOTE — Telephone Encounter (Signed)
Pt called in stated she's unable to keep her appointment at [facility] on 06/06/21 would like to reschedule . Please Advise [phone]

## 2021-06-05 NOTE — Telephone Encounter (Signed)
Called patient gave number to call. She is not sure if she will be able to have done until first of year due to time at work. Will call and see what she can do about making appointment.

## 2021-06-05 NOTE — Telephone Encounter (Signed)
Patient needs to call and reschedule her appt.  If she cannot go she needs to cancel her appt to avoid a no show fee  [facility] Scheduling [phone]

## 2021-06-06 ENCOUNTER — Ambulatory Visit: Payer: 59

## 2021-06-06 NOTE — Telephone Encounter (Signed)
Please notify patient that I am happy to write her a note for work so that she can have her PET scan done. I strongly advice she proceed, we need to rule out anything cancerous.

## 2021-06-06 NOTE — Telephone Encounter (Signed)
Patient was notified of option for note, states this may not resolve her work issue as she would still get an "occurrence" and she can be terminated for these, pt sates she is unsure she wants this news before the holidays I did urge her that the sooner that we know the more beneficial it can be for treatment. Patient stated she's terrified and doesn't know what to do mentioned possible Saturday scan. I encouraged patient to proceed as soon as possible as this is time sensitive she notes she will consider this, encouraged patient to proceed and to reach out if she needs anything from the office.

## 2021-06-06 NOTE — Telephone Encounter (Signed)
Noted and appreciate relaying my message. Hopefully patient will schedule.

## 2021-06-13 ENCOUNTER — Telehealth: Payer: Self-pay | Admitting: Primary Care

## 2021-06-13 NOTE — Telephone Encounter (Signed)
Pt need FMLA paperwork filled out by provider for her job so if pt need to get scan or blood work done she can get it done without penalty from her job

## 2021-06-13 NOTE — Telephone Encounter (Signed)
Type of forms received: fmla  Routed to: joellen  Paperwork received by : denishia    Individual made aware of 3-5 business day turn around (Y/N): yes  Form completed and patient made aware of charges(Y/N): yes   Faxed to : [employer]  Form location: put in the in box

## 2021-06-13 NOTE — Telephone Encounter (Signed)
We would not do fmla for one appointment right we would just give work note?

## 2021-06-14 NOTE — Telephone Encounter (Signed)
Completed and placed on Denise [name]'s desk.  Denise [name], can we get this faxed off ASAP?  Please also update patient that I backdated her FMLA to her visit with me in July 2022.

## 2021-06-15 NOTE — Telephone Encounter (Signed)
Faxed, copy sent for scan and copy mailed to patient. No further action needed.

## 2021-06-16 ENCOUNTER — Telehealth (INDEPENDENT_AMBULATORY_CARE_PROVIDER_SITE_OTHER): Payer: 59 | Admitting: Primary Care

## 2021-06-16 ENCOUNTER — Encounter: Payer: Self-pay | Admitting: Primary Care

## 2021-06-16 ENCOUNTER — Other Ambulatory Visit: Payer: Self-pay

## 2021-06-16 VITALS — Ht 64.0 in | Wt 175.0 lb

## 2021-06-16 DIAGNOSIS — R911 Solitary pulmonary nodule: Secondary | ICD-10-CM

## 2021-06-16 DIAGNOSIS — F32A Depression, unspecified: Secondary | ICD-10-CM | POA: Insufficient documentation

## 2021-06-16 DIAGNOSIS — F411 Generalized anxiety disorder: Secondary | ICD-10-CM

## 2021-06-16 MED ORDER — SERTRALINE HCL 50 MG PO TABS: ORAL_TABLET | ORAL | 0 refills | Status: DC

## 2021-06-16 MED ORDER — HYDROXYZINE HCL 10 MG PO TABS: 10.0000 mg | ORAL_TABLET | Freq: Every evening | ORAL | 0 refills | Status: DC | PRN

## 2021-06-16 NOTE — Progress Notes (Signed)
Patient ID: Raenette Sakata, female    DOB: [birth year], 61 y.o.   MRN: [id number]  Virtual visit completed through [employer], a video enabled telemedicine application. Due to national recommendations of social distancing due to COVID-19, a virtual visit is felt to be most appropriate for this patient at this time. Reviewed limitations, risks, security and privacy concerns of performing a virtual visit and the availability of in person appointments. I also reviewed that there may be a patient responsible charge related to this service. The patient agreed to proceed.   Patient location: home Provider location: [place] at [facility], office Persons participating in this virtual visit: patient, provider   If any vitals were documented, they were collected by patient at home unless specified below.    Ht 5\' 4"  (1.626 m)   Wt 175 lb (79.4 kg)   BMI 30.04 kg/m    CC: Anxiety Subjective:   HPI: Kendyl Festa is a 60 y.o. female with a history of tobacco abuse, hypertension, cardiomegaly, COPD, sessile colonic polyp, exertional dyspnea, lung nodule presenting on 06/16/2021 to discuss anxiety.  She underwent CT chest in September 2022 for re-evaluation of left lower lobe pulmonary nodule which had shown progression in size. Radiologist recommended PET scan for further evaluation and patient was notified of findings and recommendations. PET scan was ordered immediately after findings and recommendations were discussed with patient, she was encouraged to have this done soon. Patient did not schedule PET scan until recently.  Today she endorses increased anxiety over the last few months given her CT scan findings and PET scan recommendations. Symptoms include constant worry, mind racing thoughts, feeling overwhelmed, difficulty sleeping. Her husband passed away from colon cancer 1 month after diagnosis. This was years ago.   Chronic anxiety for years, has been handling anxiety on her own  for the most part, "just pushes through it". Chronic symptoms include "feeling shaky and jittery", hard to sit still, worry, irritability. Most of her stress is coming from her current occupation, trying to supportive for her children.  She was previously managed on Xanax and lorazepam, one prescription would last a full year. She did take Wellbutrin years ago after the passing of her husband, this was effective.   GAD 7 : Generalized Anxiety Score 06/16/2021 06/16/2021  Nervous, Anxious, on Edge 3 3  Control/stop worrying 3 3  Worry too much - different things 3 3  Trouble relaxing 3 3  Restless 3 3  Easily annoyed or irritable 3 3  Afraid - awful might happen 2 3  Total GAD 7 Score 20 21  Anxiety Difficulty Somewhat difficult Very difficult          Relevant past medical, surgical, family and social history reviewed and updated as indicated. Interim medical history since our last visit reviewed. Allergies and medications reviewed and updated. Outpatient Medications Prior to Visit  Medication Sig Dispense Refill   albuterol (VENTOLIN HFA) 108 (90 Base) MCG/ACT inhaler INHALE 2 PUFFS INTO THE LUNGS EVERY 4-6 HOURS AS NEEDED FOR TIGHTNESS/WHEEZING 8.5 each 0   amLODipine (NORVASC) 10 MG tablet TAKE 1 TABLET BY MOUTH EVERY DAY FOR BLOOD PRESSURE 90 tablet 3   Ascorbic Acid (VITAMIN C) 1000 MG tablet Take 1,000 mg by mouth daily.     aspirin EC 81 MG tablet Take 81 mg by mouth daily.     atorvastatin (LIPITOR) 20 MG tablet TAKE 1 TABLET BY MOUTH EVERY DAY FOR CHOLESTEROL 90 tablet 0   famotidine (PEPCID)  20 MG tablet Take 20 mg by mouth daily as needed for heartburn or indigestion.     ferrous gluconate (FERGON) 324 MG tablet Take 1 tablet (324 mg total) by mouth daily with breakfast. 90 tablet 3   lisinopril (ZESTRIL) 20 MG tablet Take 1 tablet (20 mg total) by mouth daily. For blood pressure. 90 tablet 3   vitamin E 180 MG (400 UNITS) capsule Take 800 Units by mouth daily.      diphenhydrAMINE HCl (BENADRYL ALLERGY PO) Take 2 capsules by mouth daily as needed (allergy).     No facility-administered medications prior to visit.     Per HPI unless specifically indicated in ROS section below Review of Systems  Constitutional:  Positive for fatigue.  Respiratory:         Chronic exertional dyspnea, no increase  Cardiovascular:  Negative for chest pain.  Psychiatric/Behavioral:  Positive for sleep disturbance. The patient is nervous/anxious.        See HPI  Objective:  Ht 5\' 4"  (1.626 m)   Wt 175 lb (79.4 kg)   BMI 30.04 kg/m   Wt Readings from Last 3 Encounters:  06/16/21 175 lb (79.4 kg)  02/24/21 175 lb (79.4 kg)  12/30/19 175 lb 8 oz (79.6 kg)       Physical exam: General: Alert and oriented x 3, no distress, does not appear sickly  Pulmonary: Speaks in complete sentences without increased work of breathing, no cough during visit.  Psychiatric: Normal mood, thought content. She does appear anxious.      Results for orders placed or performed in visit on 02/24/21  Lipid panel  Result Value Ref Range   Cholesterol 210 (H) 0 - 200 mg/dL   Triglycerides 02/26/21 (H) 0.0 - 149.0 mg/dL   HDL 332.9 (L) 51.88 mg/dL   VLDL >41.66 (H) 0.0 - 06.3 mg/dL   Total CHOL/HDL Ratio 6    NonHDL 173.33   CBC  Result Value Ref Range   WBC 8.5 4.0 - 10.5 K/uL   RBC 4.86 3.87 - 5.11 Mil/uL   Platelets 254.0 150.0 - 400.0 K/uL   Hemoglobin 15.9 (H) 12.0 - 15.0 g/dL   HCT 01.6 (H) 01.0 - 93.2 %   MCV 97.6 78.0 - 100.0 fl   MCHC 33.5 30.0 - 36.0 g/dL   RDW 35.5 (H) 73.2 - 20.2 %  Comprehensive metabolic panel  Result Value Ref Range   Sodium 131 (L) 135 - 145 mEq/L   Potassium 3.5 3.5 - 5.1 mEq/L   Chloride 96 96 - 112 mEq/L   CO2 26 19 - 32 mEq/L   Glucose, Bld 104 (H) 70 - 99 mg/dL   BUN 6 6 - 23 mg/dL   Creatinine, Ser 54.2 0.40 - 1.20 mg/dL   Total Bilirubin 0.2 0.2 - 1.2 mg/dL   Alkaline Phosphatase 113 39 - 117 U/L   AST 23 0 - 37 U/L   ALT 15 0 - 35  U/L   Total Protein 7.1 6.0 - 8.3 g/dL   Albumin 3.8 3.5 - 5.2 g/dL   GFR 7.06 23.76 mL/min   Calcium 8.9 8.4 - 10.5 mg/dL  IBC + Ferritin  Result Value Ref Range   Iron 37 (L) 42 - 145 ug/dL   Transferrin >28.31 517.6 - 360.0 mg/dL   Saturation Ratios 8.2 (L) 20.0 - 50.0 %   Ferritin 21.8 10.0 - 291.0 ng/mL  LDL cholesterol, direct  Result Value Ref Range   Direct LDL 168.0 mg/dL  Assessment & Plan:   Problem List Items Addressed This Visit       Other   Lung nodule    To left lower lobe, slight increase in size  Discussed CT chest results from September 2022. PET scan pending.  Will treat anxiety.      GAD (generalized anxiety disorder) - Primary    Chronic anxiety for years, increased over last few months given CT scan results.   Given daily symptoms I recommend SSRI. She was initially resistant but did agree to try. Rx for Zoloft 50 mg sent to pharmacy. She would also like something to use PRN, Rx for hydroxyzine sent to pharmacy with drowsiness precautions. She kindly declines therapy.   Patient is to take 1/2 tablet daily for 8 days, then advance to 1 full tablet thereafter. We discussed possible side effects of headache, GI upset, drowsiness, and SI/HI. If thoughts of SI/HI develop, we discussed to present to the emergency immediately. Patient verbalized understanding.   She was instructed to schedule a follow up visit for 4 to 6 weeks for re-evaluation.         Relevant Medications   sertraline (ZOLOFT) 50 MG tablet   hydrOXYzine (ATARAX/VISTARIL) 10 MG tablet     Meds ordered this encounter  Medications   sertraline (ZOLOFT) 50 MG tablet    Sig: Take 1/2 tablet by mouth once daily for 7-10 days,then increase to 1 full tablet thereafter for anxiety.    Dispense:  60 tablet    Refill:  0    Order Specific Question:   Supervising Provider    Answer:   BEDSOLE, AMY E [[id number]]   hydrOXYzine (ATARAX/VISTARIL) 10 MG tablet    Sig: Take 1 tablet (10 mg  total) by mouth at bedtime as needed for anxiety.    Dispense:  30 tablet    Refill:  0    Order Specific Question:   Supervising Provider    Answer:   BEDSOLE, AMY E [[id number]]    No orders of the defined types were placed in this encounter.   I discussed the assessment and treatment plan with the patient. The patient was provided an opportunity to ask questions and all were answered. The patient agreed with the plan and demonstrated an understanding of the instructions. The patient was advised to call back or seek an in-person evaluation if the symptoms worsen or if the condition fails to improve as anticipated.  Follow up plan:  Start sertraline (Zoloft) 50 mg for anxiety and depression. Take 1/2 tablet by mouth once daily for about one week, then increase to 1 full tablet thereafter.   Please contact me if you experience any problems when starting this medication or with the dose increase to 1 full tablet. Common side effects should abate within 1-2 weeks.   You may take the hydroxyzine 10 mg tablets at bedtime for sleep/anxiety. This may cause drowsiness.   Please schedule a follow up visit for 6 weeks for follow up of anxiety.  It was a pleasure to see you today!   Doreene Nest, NP

## 2021-06-16 NOTE — Assessment & Plan Note (Signed)
Chronic anxiety for years, increased over last few months given CT scan results.   Given daily symptoms I recommend SSRI. She was initially resistant but did agree to try. Rx for Zoloft 50 mg sent to pharmacy. She would also like something to use PRN, Rx for hydroxyzine sent to pharmacy with drowsiness precautions. She kindly declines therapy.   Patient is to take 1/2 tablet daily for 8 days, then advance to 1 full tablet thereafter. We discussed possible side effects of headache, GI upset, drowsiness, and SI/HI. If thoughts of SI/HI develop, we discussed to present to the emergency immediately. Patient verbalized understanding.   She was instructed to schedule a follow up visit for 4 to 6 weeks for re-evaluation.

## 2021-06-16 NOTE — Patient Instructions (Signed)
Start sertraline (Zoloft) 50 mg for anxiety and depression. Take 1/2 tablet by mouth once daily for about one week, then increase to 1 full tablet thereafter.   Please contact me if you experience any problems when starting this medication or with the dose increase to 1 full tablet. Common side effects should abate within 1-2 weeks.   You may take the hydroxyzine 10 mg tablets at bedtime for sleep/anxiety. This may cause drowsiness.   Please schedule a follow up visit for 6 weeks for follow up of anxiety.  It was a pleasure to see you today!

## 2021-06-16 NOTE — Assessment & Plan Note (Signed)
To left lower lobe, slight increase in size  Discussed CT chest results from September 2022. PET scan pending.  Will treat anxiety.

## 2021-06-21 ENCOUNTER — Ambulatory Visit
Admission: RE | Admit: 2021-06-21 | Discharge: 2021-06-21 | Disposition: A | Discharge status: Home / Self Care | Payer: 59 | Source: Ambulatory Visit | Attending: Primary Care | Admitting: Primary Care

## 2021-06-21 DIAGNOSIS — I2510 Atherosclerotic heart disease of native coronary artery without angina pectoris: Secondary | ICD-10-CM | POA: Insufficient documentation

## 2021-06-21 DIAGNOSIS — R911 Solitary pulmonary nodule: Secondary | ICD-10-CM | POA: Diagnosis present

## 2021-06-21 DIAGNOSIS — J439 Emphysema, unspecified: Secondary | ICD-10-CM | POA: Insufficient documentation

## 2021-06-21 DIAGNOSIS — I6523 Occlusion and stenosis of bilateral carotid arteries: Secondary | ICD-10-CM | POA: Insufficient documentation

## 2021-06-21 DIAGNOSIS — R590 Localized enlarged lymph nodes: Secondary | ICD-10-CM | POA: Diagnosis not present

## 2021-06-21 DIAGNOSIS — I700 Atherosclerosis of aorta: Secondary | ICD-10-CM | POA: Insufficient documentation

## 2021-06-21 LAB — GLUCOSE, CAPILLARY: Glucose-Capillary: 103 mg/dL — ABNORMAL HIGH (ref 70–99)

## 2021-06-21 MED ORDER — FLUDEOXYGLUCOSE F - 18 (FDG) INJECTION
9.1000 | Freq: Once | INTRAVENOUS | Status: AC | PRN
  Administered 2021-06-21: 9.9 via INTRAVENOUS

## 2021-06-26 ENCOUNTER — Telehealth: Payer: Self-pay | Admitting: Primary Care

## 2021-06-26 ENCOUNTER — Other Ambulatory Visit: Payer: Self-pay | Admitting: Primary Care

## 2021-06-26 DIAGNOSIS — N9489 Other specified conditions associated with female genital organs and menstrual cycle: Secondary | ICD-10-CM

## 2021-06-26 DIAGNOSIS — I2510 Atherosclerotic heart disease of native coronary artery without angina pectoris: Secondary | ICD-10-CM

## 2021-06-26 NOTE — Telephone Encounter (Signed)
Pt called in requesting a call back regarding test results #[phone]

## 2021-06-26 NOTE — Telephone Encounter (Signed)
Called patient see results note for documentation.

## 2021-07-09 ENCOUNTER — Other Ambulatory Visit: Payer: Self-pay | Admitting: Primary Care

## 2021-07-09 DIAGNOSIS — F411 Generalized anxiety disorder: Secondary | ICD-10-CM

## 2021-07-10 ENCOUNTER — Other Ambulatory Visit: Payer: Self-pay

## 2021-07-10 ENCOUNTER — Other Ambulatory Visit (INDEPENDENT_AMBULATORY_CARE_PROVIDER_SITE_OTHER): Payer: 59

## 2021-07-10 DIAGNOSIS — R739 Hyperglycemia, unspecified: Secondary | ICD-10-CM | POA: Diagnosis not present

## 2021-07-10 DIAGNOSIS — E871 Hypo-osmolality and hyponatremia: Secondary | ICD-10-CM | POA: Diagnosis not present

## 2021-07-10 DIAGNOSIS — E785 Hyperlipidemia, unspecified: Secondary | ICD-10-CM | POA: Diagnosis not present

## 2021-07-10 LAB — LIPID PANEL
Cholesterol: 163 mg/dL (ref 0–200)
HDL: 49.7 mg/dL (ref >39.00)
LDL Cholesterol: 94 mg/dL (ref 0–99)
NonHDL: 113.12
Total CHOL/HDL Ratio: 3
Triglycerides: 94 mg/dL (ref 0.0–149.0)
VLDL: 18.8 mg/dL (ref 0.0–40.0)

## 2021-07-10 LAB — BASIC METABOLIC PANEL
BUN: 8 mg/dL (ref 6–23)
CO2: 28 mEq/L (ref 19–32)
Calcium: 9.6 mg/dL (ref 8.4–10.5)
Chloride: 100 mEq/L (ref 96–112)
Creatinine, Ser: 0.62 mg/dL (ref 0.40–1.20)
GFR: 97.14 mL/min (ref >60.00)
Glucose, Bld: 88 mg/dL (ref 70–99)
Potassium: 4.2 mEq/L (ref 3.5–5.1)
Sodium: 134 mEq/L — ABNORMAL LOW (ref 135–145)

## 2021-07-10 LAB — HEMOGLOBIN A1C: Hgb A1c MFr Bld: 5.2 % (ref 4.6–6.5)

## 2021-07-11 DIAGNOSIS — E785 Hyperlipidemia, unspecified: Secondary | ICD-10-CM

## 2021-07-11 MED ORDER — ATORVASTATIN CALCIUM 40 MG PO TABS: 40.0000 mg | ORAL_TABLET | Freq: Every day | ORAL | 1 refills | Status: DC

## 2021-07-14 ENCOUNTER — Other Ambulatory Visit: Payer: Self-pay | Admitting: Primary Care

## 2021-07-14 DIAGNOSIS — J449 Chronic obstructive pulmonary disease, unspecified: Secondary | ICD-10-CM

## 2021-08-02 NOTE — Telephone Encounter (Signed)
This encounter was created in error - please disregard.

## 2021-08-08 ENCOUNTER — Other Ambulatory Visit: Payer: Self-pay | Admitting: Primary Care

## 2021-08-08 DIAGNOSIS — F411 Generalized anxiety disorder: Secondary | ICD-10-CM

## 2021-08-11 ENCOUNTER — Other Ambulatory Visit: Payer: Self-pay | Admitting: Primary Care

## 2021-08-11 DIAGNOSIS — J449 Chronic obstructive pulmonary disease, unspecified: Secondary | ICD-10-CM

## 2021-09-06 ENCOUNTER — Ambulatory Visit
Admission: RE | Admit: 2021-09-06 | Discharge: 2021-09-06 | Disposition: A | Discharge status: Home / Self Care | Payer: 59 | Source: Ambulatory Visit | Attending: Primary Care | Admitting: Primary Care

## 2021-09-06 DIAGNOSIS — N9489 Other specified conditions associated with female genital organs and menstrual cycle: Secondary | ICD-10-CM

## 2021-09-08 ENCOUNTER — Other Ambulatory Visit: Payer: Self-pay | Admitting: Primary Care

## 2021-09-08 DIAGNOSIS — N9489 Other specified conditions associated with female genital organs and menstrual cycle: Secondary | ICD-10-CM | POA: Insufficient documentation

## 2021-09-08 NOTE — Assessment & Plan Note (Signed)
To left side. Recommend to repeat pelvic/transvaginal ultrasound and add MRI in 4-6 months.

## 2021-09-12 ENCOUNTER — Other Ambulatory Visit: Payer: Self-pay | Admitting: Primary Care

## 2021-09-12 DIAGNOSIS — J449 Chronic obstructive pulmonary disease, unspecified: Secondary | ICD-10-CM

## 2021-09-13 ENCOUNTER — Other Ambulatory Visit: Payer: Self-pay | Admitting: Primary Care

## 2021-09-13 DIAGNOSIS — J449 Chronic obstructive pulmonary disease, unspecified: Secondary | ICD-10-CM

## 2021-10-07 ENCOUNTER — Other Ambulatory Visit: Payer: Self-pay | Admitting: Primary Care

## 2021-10-07 DIAGNOSIS — J449 Chronic obstructive pulmonary disease, unspecified: Secondary | ICD-10-CM

## 2021-10-10 ENCOUNTER — Other Ambulatory Visit: Payer: Self-pay | Admitting: Primary Care

## 2021-10-10 DIAGNOSIS — F411 Generalized anxiety disorder: Secondary | ICD-10-CM

## 2021-10-26 ENCOUNTER — Other Ambulatory Visit: Payer: Self-pay | Admitting: Primary Care

## 2021-10-26 DIAGNOSIS — J449 Chronic obstructive pulmonary disease, unspecified: Secondary | ICD-10-CM

## 2021-10-26 NOTE — Telephone Encounter (Signed)
Received refill request for albuterol inhaler, does she actually need the refill or is this on auto fill? ? ?Find out how often she is using the albuterol inhaler. ?

## 2021-10-27 NOTE — Telephone Encounter (Signed)
Left message to return call to our office.  

## 2021-10-31 NOTE — Telephone Encounter (Signed)
Left message to return call to our office.  

## 2021-11-02 NOTE — Telephone Encounter (Signed)
[facility] Night - Client ?Nonclinical Telephone Record  ?AccessNurse? ?Client [facility] Night - Client ?Client Site [facility] - Night ?Provider Vernona Rieger - NP ?Contact Type Call ?Who Is Calling Patient / Member / Family / Caregiver ?Caller Name Denise [name] ?Caller Phone Number [phone] ?Call Type Message Only Information Provided ?Reason for Call Returning a Call from the Office ?Initial Comment Caller states she missed a call from [name] ?Disp. Time Disposition Final User ?11/02/2021 8:07:36 AM General Information Provided Yes [name] ?Call Closed By: [name] ?Transaction Date/Time: 11/02/2021 8:06:09 AM (ET ?

## 2021-11-03 NOTE — Telephone Encounter (Signed)
Called patient she has had increased symptoms due to weather. Is in need of refills  ?

## 2021-12-05 ENCOUNTER — Other Ambulatory Visit: Payer: Self-pay | Admitting: Primary Care

## 2021-12-05 DIAGNOSIS — J449 Chronic obstructive pulmonary disease, unspecified: Secondary | ICD-10-CM

## 2021-12-06 NOTE — Telephone Encounter (Signed)
Does she actually need a refill of her albuterol inhaler or is this an automatic refill from her pharmacy? ? ?If she needs the refill then we need to discuss better options for treatment of her symptoms as the albuterol inhaler isn't intended to be used so frequently.  ?

## 2021-12-11 NOTE — Telephone Encounter (Signed)
Called patient she does not need refill was by pharmacy  ?

## 2021-12-24 ENCOUNTER — Other Ambulatory Visit: Payer: Self-pay | Admitting: Primary Care

## 2021-12-24 DIAGNOSIS — D500 Iron deficiency anemia secondary to blood loss (chronic): Secondary | ICD-10-CM

## 2021-12-24 DIAGNOSIS — E785 Hyperlipidemia, unspecified: Secondary | ICD-10-CM

## 2021-12-27 ENCOUNTER — Encounter: Payer: Self-pay | Admitting: Primary Care

## 2021-12-27 ENCOUNTER — Ambulatory Visit (INDEPENDENT_AMBULATORY_CARE_PROVIDER_SITE_OTHER): Payer: Managed Care, Other (non HMO) | Admitting: Primary Care

## 2021-12-27 VITALS — BP 134/74 | HR 86 | Temp 98.6°F | Ht 64.0 in | Wt 187.0 lb

## 2021-12-27 DIAGNOSIS — J449 Chronic obstructive pulmonary disease, unspecified: Secondary | ICD-10-CM | POA: Diagnosis not present

## 2021-12-27 DIAGNOSIS — K5520 Angiodysplasia of colon without hemorrhage: Secondary | ICD-10-CM

## 2021-12-27 DIAGNOSIS — F411 Generalized anxiety disorder: Secondary | ICD-10-CM | POA: Diagnosis not present

## 2021-12-27 DIAGNOSIS — D500 Iron deficiency anemia secondary to blood loss (chronic): Secondary | ICD-10-CM | POA: Diagnosis not present

## 2021-12-27 DIAGNOSIS — E785 Hyperlipidemia, unspecified: Secondary | ICD-10-CM

## 2021-12-27 DIAGNOSIS — R0609 Other forms of dyspnea: Secondary | ICD-10-CM

## 2021-12-27 DIAGNOSIS — N9489 Other specified conditions associated with female genital organs and menstrual cycle: Secondary | ICD-10-CM

## 2021-12-27 DIAGNOSIS — I10 Essential (primary) hypertension: Secondary | ICD-10-CM

## 2021-12-27 DIAGNOSIS — R911 Solitary pulmonary nodule: Secondary | ICD-10-CM

## 2021-12-27 LAB — COMPREHENSIVE METABOLIC PANEL
ALT: 18 U/L (ref 0–35)
AST: 20 U/L (ref 0–37)
Albumin: 4.3 g/dL (ref 3.5–5.2)
Alkaline Phosphatase: 99 U/L (ref 39–117)
BUN: 8 mg/dL (ref 6–23)
CO2: 29 mEq/L (ref 19–32)
Calcium: 9.8 mg/dL (ref 8.4–10.5)
Chloride: 99 mEq/L (ref 96–112)
Creatinine, Ser: 0.73 mg/dL (ref 0.40–1.20)
GFR: 89.42 mL/min (ref >60.00)
Glucose, Bld: 94 mg/dL (ref 70–99)
Potassium: 4.4 mEq/L (ref 3.5–5.1)
Sodium: 137 mEq/L (ref 135–145)
Total Bilirubin: 0.4 mg/dL (ref 0.2–1.2)
Total Protein: 6.9 g/dL (ref 6.0–8.3)

## 2021-12-27 LAB — IBC + FERRITIN
Ferritin: 20.2 ng/mL (ref 10.0–291.0)
Iron: 34 ug/dL — ABNORMAL LOW (ref 42–145)
Saturation Ratios: 8.9 % — ABNORMAL LOW (ref 20.0–50.0)
TIBC: 380.8 ug/dL (ref 250.0–450.0)
Transferrin: 272 mg/dL (ref 212.0–360.0)

## 2021-12-27 LAB — CBC
HCT: 44.5 % (ref 36.0–46.0)
Hemoglobin: 14.5 g/dL (ref 12.0–15.0)
MCHC: 32.5 g/dL (ref 30.0–36.0)
MCV: 93.9 fl (ref 78.0–100.0)
Platelets: 342 10*3/uL (ref 150.0–400.0)
RBC: 4.74 Mil/uL (ref 3.87–5.11)
RDW: 16.7 % — ABNORMAL HIGH (ref 11.5–15.5)
WBC: 9.7 10*3/uL (ref 4.0–10.5)

## 2021-12-27 LAB — LIPID PANEL
Cholesterol: 152 mg/dL (ref 0–200)
HDL: 50.8 mg/dL (ref >39.00)
LDL Cholesterol: 78 mg/dL (ref 0–99)
NonHDL: 101.36
Total CHOL/HDL Ratio: 3
Triglycerides: 115 mg/dL (ref 0.0–149.0)
VLDL: 23 mg/dL (ref 0.0–40.0)

## 2021-12-27 LAB — HEMOGLOBIN A1C: Hgb A1c MFr Bld: 5.5 % (ref 4.6–6.5)

## 2021-12-27 MED ORDER — SERTRALINE HCL 100 MG PO TABS: 100.0000 mg | ORAL_TABLET | Freq: Every day | ORAL | 1 refills | Status: DC

## 2021-12-27 MED ORDER — LISINOPRIL 30 MG PO TABS: 30.0000 mg | ORAL_TABLET | Freq: Every day | ORAL | 3 refills | Status: DC

## 2021-12-27 MED ORDER — FLUTICASONE-SALMETEROL 250-50 MCG/ACT IN AEPB: 1.0000 | INHALATION_SPRAY | Freq: Two times a day (BID) | RESPIRATORY_TRACT | 5 refills | Status: DC

## 2021-12-27 NOTE — Assessment & Plan Note (Signed)
Asymptomatic.  Repeat CBC and iron studies pending. Continue ferrous sulfate 325 mg daily.  Strongly advised she follow up with GI as recommended last year.

## 2021-12-27 NOTE — Assessment & Plan Note (Signed)
Uncontrolled with inappropriate use of albuterol inhaler.   Start Advair 250-50 mcg BID. Continue albuterol inhaler PRN.  She will update if no improvement.  Repeat CT chest due and pending.

## 2021-12-27 NOTE — Assessment & Plan Note (Signed)
LDL goal of <70.  Repeat lipid panel pending. Continue atorvastatin 40 mg daily, continue aspirin 81 mg daily

## 2021-12-27 NOTE — Assessment & Plan Note (Signed)
Repeat pelvic/transvaginal US due and pending.  Patient agrees.

## 2021-12-27 NOTE — Assessment & Plan Note (Signed)
Above goal, would like to see her below 130/80 given her CAD and smoking history.  Increase lisinopril to 30 mg daily. Continue amlodipine 10 mg daily.  CMP pending.

## 2021-12-27 NOTE — Assessment & Plan Note (Signed)
Overall controlled per patient. We discussed my recommendations for cardiology evaluation given her advanced CAD as she did not go last year when referred.  She declines today.  Continue aggressive lipid lowering therapy and aspirin 81 mg, BP control.  She is considering stopping smoking

## 2021-12-27 NOTE — Assessment & Plan Note (Signed)
Somewhat improved, not at goal.  Increase sertraline to 75 mg x 1 week, then to 100 mg thereafter. She agrees.  Discontinued hydroxyzine as this was ineffective. She will update

## 2021-12-27 NOTE — Assessment & Plan Note (Signed)
Repeat CT chest due and pending.

## 2021-12-27 NOTE — Progress Notes (Signed)
Subjective:    Patient ID: Denise [name], female    DOB: 06-07-1961, 61 y.o.   MRN: [id number]  HPI  Denise [name] is a very pleasant 61 y.o. female with a history of hypertension, cardiomegaly, AVM of colon, COPD, lung nodule, lower extremity edema, tobacco abuse, symptomatic anemia, GAD who presents today for follow up of chronic conditions.  1) Essential Hypertension: Currently managed on lisinopril 20 mg daily, amlodipine 10 mg daily. She denies increased chest pain, dizziness, exertional dyspnea.   She was referred to cardiology last November 2022 but she never went for evaluation. She is compliant to atorvastatin 40 mg and aspirin 81 mg.   She checks her BP infrequently, typically 130's-140's/70's-80's.   BP Readings from Last 3 Encounters:  12/27/21 134/74  02/24/21 136/80  12/30/19 126/82     2) GAD: Currently managed on sertraline 50 mg daily, hydroxyzine 10 mg PRN. She continues to notice chronic anxiety, but has noticed a small improvement since starting sertraline. Symptoms include mind racing thoughts, worrying. She's not noticed improvement with hydroxyzine.   3) Hyperlipidemia: Currently managed on atorvastatin 40 mg daily. Long history of tobacco abuse. Also with aortic and coronary atherosclerosis.   Currently managed on atorvastatin 40 mg and aspirin 81 mg daily. Atorvastatin was increased to 40 mg in December 2022 for a LDL goal of <70. She was advised to return 2 months later for repeat lipid panel, she did not do so.  4) Tobacco Abuse/Lung Nodule/COPD: Known left lower lobe pulmonary nodule which is evidenced on CT chest from September 2022 and PET scan from November 2022. She is due for repeat CT chest now per recommendations.   Managed on albuterol PRN, increased use of albuterol during seasonal changes. She's is using her albuterol inhaler 1-2 times daily on average for symptoms of wheezing and mild shortness of breath at night. She is not managed on  LABA or ICS.   5) Iron deficiency Anemia: Chronic. Currently managed on ferrous sulfate 325 mg daily, she will take 2 tablets 3-4 days weekly on average for symptoms of fatigue.     Review of Systems  Constitutional:  Negative for unexpected weight change.  Respiratory:  Positive for cough, shortness of breath and wheezing. Negative for chest tightness.   Allergic/Immunologic: Positive for environmental allergies.  Neurological:  Negative for dizziness and headaches.  Psychiatric/Behavioral:  The patient is nervous/anxious.         Past Medical History:  Diagnosis Date   Allergy    BMI 35.0-35.9,adult 03/31/2012   Chickenpox    Cholecystitis with cholelithiasis 03/31/2012   Heart murmur    Hypertension 03/31/2012   Shingles     Social History   Socioeconomic History   Marital status: Widowed    Spouse name: Not on file   Number of children: Not on file   Years of education: Not on file   Highest education level: Not on file  Occupational History   Not on file  Tobacco Use   Smoking status: Former    Packs/day: 0.75    Years: 34.00    Pack years: 25.50    Types: Cigarettes    Quit date: 05/07/2019    Years since quitting: 2.6   Smokeless tobacco: Never  Substance and Sexual Activity   Alcohol use: No   Drug use: No   Sexual activity: Never    Birth control/protection: None, Post-menopausal  Other Topics Concern   Not on file  Social History Narrative  Widow.    2 children.   Works in Therapist, art.   Enjoys reading, cooking.    Social Determinants of Health   Financial Resource Strain: Not on file  Food Insecurity: Not on file  Transportation Needs: Not on file  Physical Activity: Not on file  Stress: Not on file  Social Connections: Not on file  Intimate Partner Violence: Not on file    Past Surgical History:  Procedure Laterality Date   BIOPSY  12/24/2019   Procedure: BIOPSY;  Surgeon: Milus Banister, MD;  Location: [place];  Service:  Endoscopy;;   CHOLECYSTECTOMY  03/30/2012   Procedure: LAPAROSCOPIC CHOLECYSTECTOMY WITH INTRAOPERATIVE CHOLANGIOGRAM;  Surgeon: Edward Jolly, MD;  Location: [facility];  Service: General;  Laterality: N/A;   COLONOSCOPY WITH PROPOFOL N/A 12/24/2019   Procedure: COLONOSCOPY WITH PROPOFOL;  Surgeon: Milus Banister, MD;  Location: [place];  Service: Endoscopy;  Laterality: N/A;   ESOPHAGOGASTRODUODENOSCOPY (EGD) WITH PROPOFOL N/A 12/24/2019   Procedure: ESOPHAGOGASTRODUODENOSCOPY (EGD) WITH PROPOFOL;  Surgeon: Milus Banister, MD;  Location: [facility] ENDOSCOPY;  Service: Endoscopy;  Laterality: N/A;   HOT HEMOSTASIS N/A 12/24/2019   Procedure: HOT HEMOSTASIS (ARGON PLASMA COAGULATION/BICAP);  Surgeon: Milus Banister, MD;  Location: [facility] ENDOSCOPY;  Service: Endoscopy;  Laterality: N/A;   POLYPECTOMY  12/24/2019   Procedure: POLYPECTOMY;  Surgeon: Milus Banister, MD;  Location[facility] ENDOSCOPY;  Service: Endoscopy;;   TUBAL LIGATION      Family History  Problem Relation Age of Onset   Arthritis Mother    Cervical cancer Mother    Heart disease Father    Hypertension Father     Allergies  Allergen Reactions   Codeine Itching    Current Outpatient Medications on File Prior to Visit  Medication Sig Dispense Refill   albuterol (VENTOLIN HFA) 108 (90 Base) MCG/ACT inhaler INHALE 2 PUFFS INTO THE LUNGS EVERY 4-6 HOURS AS NEEDED FOR TIGHTNESS/WHEEZING 8.5 each 0   amLODipine (NORVASC) 10 MG tablet TAKE 1 TABLET BY MOUTH EVERY DAY FOR BLOOD PRESSURE 90 tablet 3   Ascorbic Acid (VITAMIN C) 1000 MG tablet Take 1,000 mg by mouth daily.     aspirin EC 81 MG tablet Take 81 mg by mouth daily.     atorvastatin (LIPITOR) 40 MG tablet Take 1 tablet (40 mg total) by mouth daily. For cholesterol. 90 tablet 1   famotidine (PEPCID) 20 MG tablet Take 20 mg by mouth daily as needed for heartburn or indigestion.     ferrous gluconate (FERGON) 324 MG tablet Take 1 tablet (324 mg total) by mouth daily with  breakfast. 90 tablet 3   pyridOXINE (VITAMIN B-6) 25 MG tablet Take 25 mg by mouth daily.     vitamin B-12 (CYANOCOBALAMIN) 1000 MCG tablet Take 1,000 mcg by mouth daily.     vitamin E 180 MG (400 UNITS) capsule Take 800 Units by mouth daily.     No current facility-administered medications on file prior to visit.    BP 134/74   Pulse 86   Temp 98.6 F (37 C) (Oral)   Ht 5\' 4"  (1.626 m)   Wt 187 lb (84.8 kg)   SpO2 94%   BMI 32.10 kg/m  Objective:   Physical Exam Constitutional:      General: She is not in acute distress. Cardiovascular:     Rate and Rhythm: Normal rate and regular rhythm.  Pulmonary:     Effort: Pulmonary effort is normal.     Breath sounds:  Normal breath sounds.  Abdominal:     General: Bowel sounds are normal.     Palpations: Abdomen is soft.     Tenderness: There is no abdominal tenderness.  Musculoskeletal:     Cervical back: Neck supple.  Skin:    General: Skin is warm and dry.  Psychiatric:        Mood and Affect: Mood normal.          Assessment & Plan:

## 2021-12-27 NOTE — Patient Instructions (Signed)
Stop by the lab prior to leaving today. I will notify you of your results once received.   You will be contacted regarding your CT chest and for your pelvic/transvaginal ultrasound.  Please let us know if you have not been contacted within two weeks.   We increased the dose of your lisinopril to 30 mg. Take 1 tablet by mouth one daily. Monitor your blood pressure and notify me if you see readings consistently at or above 130 on top and/or 90 on bottom.  Start Advair 250-50 mcg inhaler. Inhale 1 puff into the lungs twice daily.  Only use the albuterol as needed.   Call GI for your repeat colonoscopy.   It was a pleasure to see you today!

## 2021-12-27 NOTE — Assessment & Plan Note (Signed)
Due for repeat colonoscopy, strongly advised she call to schedule.

## 2022-01-04 ENCOUNTER — Other Ambulatory Visit: Payer: Self-pay | Admitting: Primary Care

## 2022-01-04 DIAGNOSIS — D500 Iron deficiency anemia secondary to blood loss (chronic): Secondary | ICD-10-CM

## 2022-01-04 DIAGNOSIS — E785 Hyperlipidemia, unspecified: Secondary | ICD-10-CM

## 2022-01-04 MED ORDER — ROSUVASTATIN CALCIUM 40 MG PO TABS: 40.0000 mg | ORAL_TABLET | Freq: Every day | ORAL | 0 refills | Status: DC

## 2022-01-12 ENCOUNTER — Other Ambulatory Visit: Payer: Self-pay | Admitting: Primary Care

## 2022-01-12 DIAGNOSIS — E785 Hyperlipidemia, unspecified: Secondary | ICD-10-CM

## 2022-01-12 DIAGNOSIS — I10 Essential (primary) hypertension: Secondary | ICD-10-CM

## 2022-01-13 MED ORDER — ATORVASTATIN CALCIUM 40 MG PO TABS: 40.0000 mg | ORAL_TABLET | Freq: Every day | ORAL | 0 refills | Status: DC

## 2022-02-11 IMAGING — US US PELVIS COMPLETE WITH TRANSVAGINAL
1 series · 13 of 25 positions shown · non-contrast
Comparison: PET-CT done on 06/21/2021

CLINICAL DATA: Left adnexal mass

EXAM:
TRANSABDOMINAL AND TRANSVAGINAL ULTRASOUND OF PELVIS
DOPPLER ULTRASOUND OF OVARIES
TECHNIQUE: Both transabdominal and transvaginal ultrasound examinations of the
pelvis were performed. Transabdominal technique was performed for
global imaging of the pelvis including uterus, ovaries, adnexal
regions, and pelvic cul-de-sac.
It was necessary to proceed with endovaginal exam following the
transabdominal exam to visualize the ovaries. Color and duplex
Doppler ultrasound was utilized to evaluate blood flow to the
ovaries.

[Series 1: us pelvis complete with transvaginal · 0.27mm/px · 41 acquisitions, 13 frames shown]
[im 1/41]
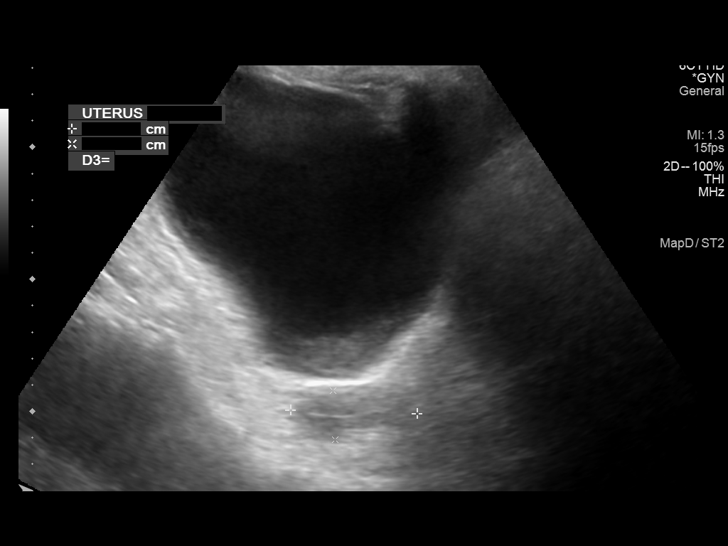
[im 4/41]
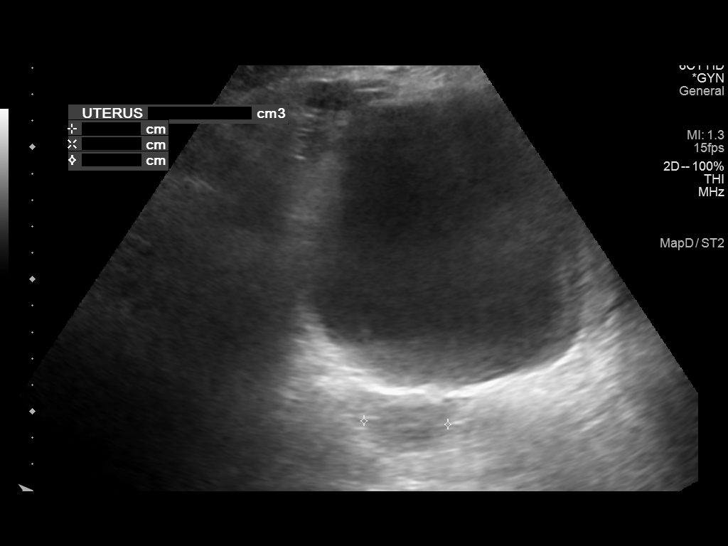
[im 7/41]
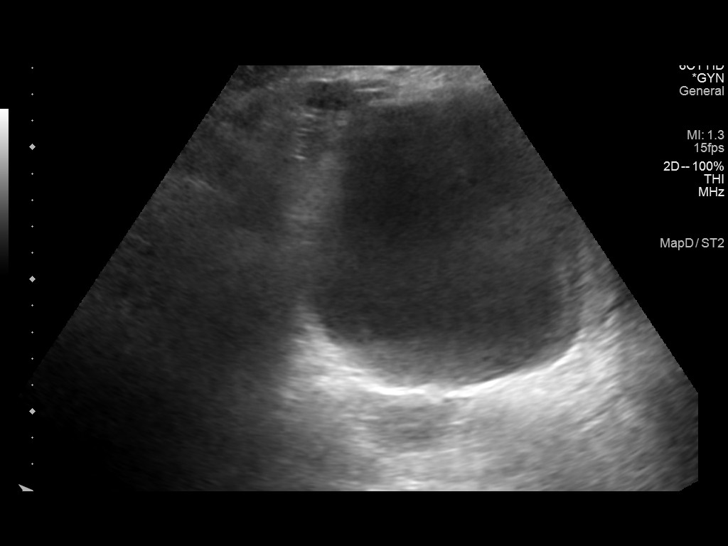
[im 11/41]
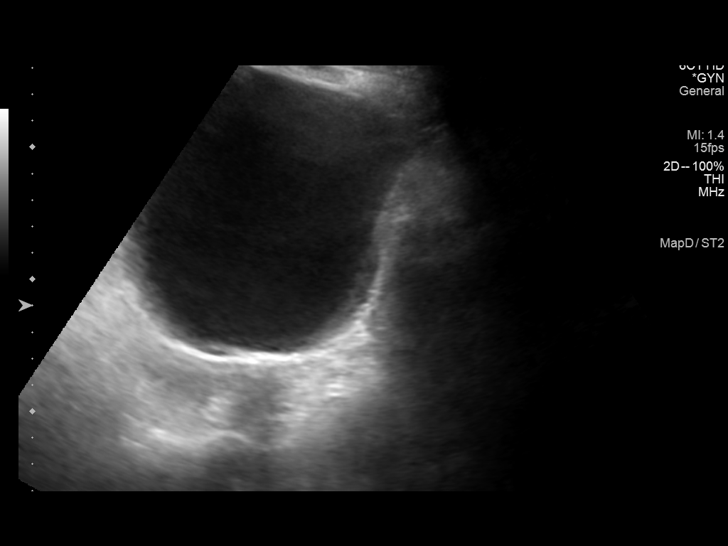
[im 14/41]
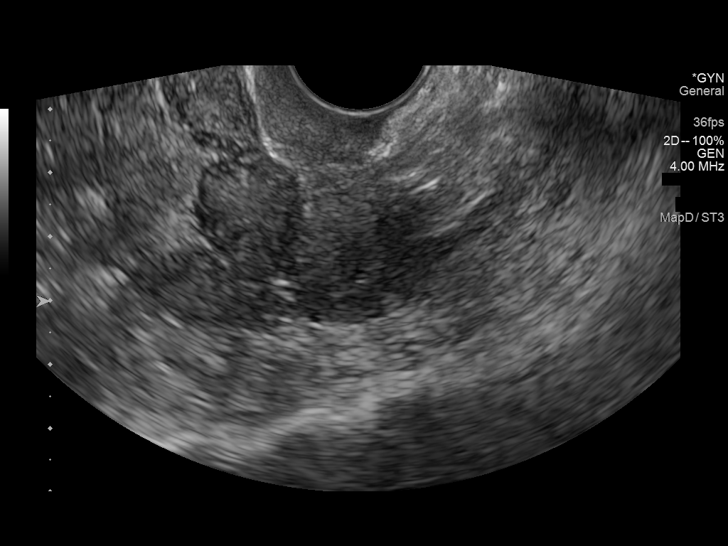
[im 17/41]
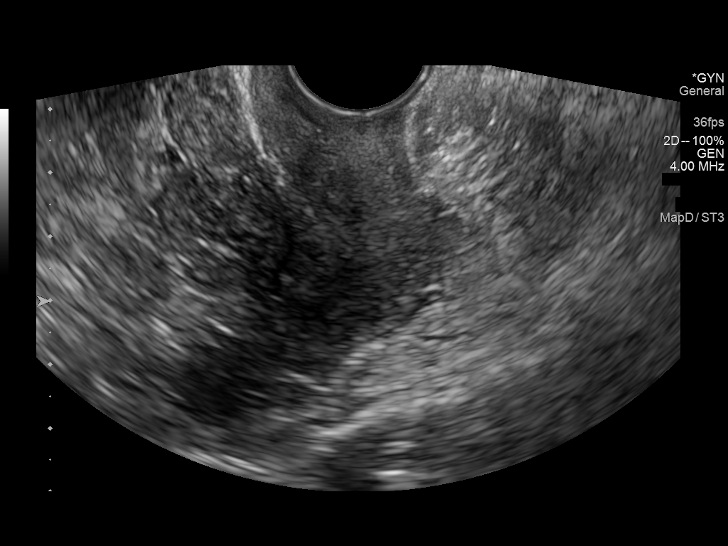
[im 21/41]
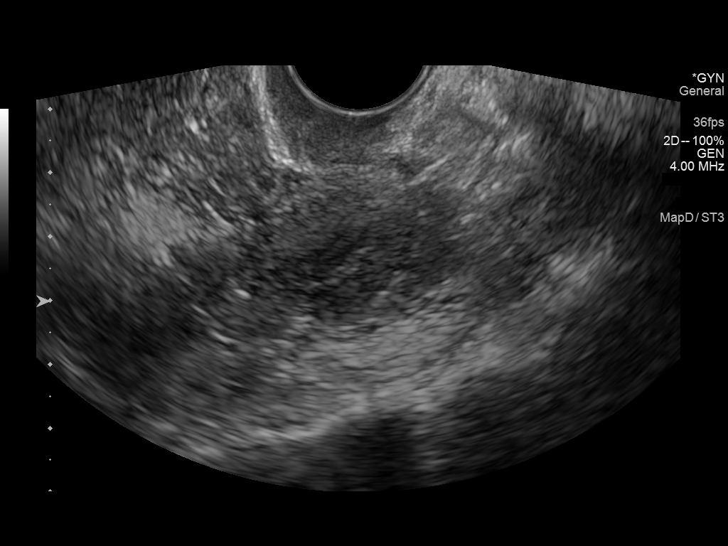
[im 24/41]
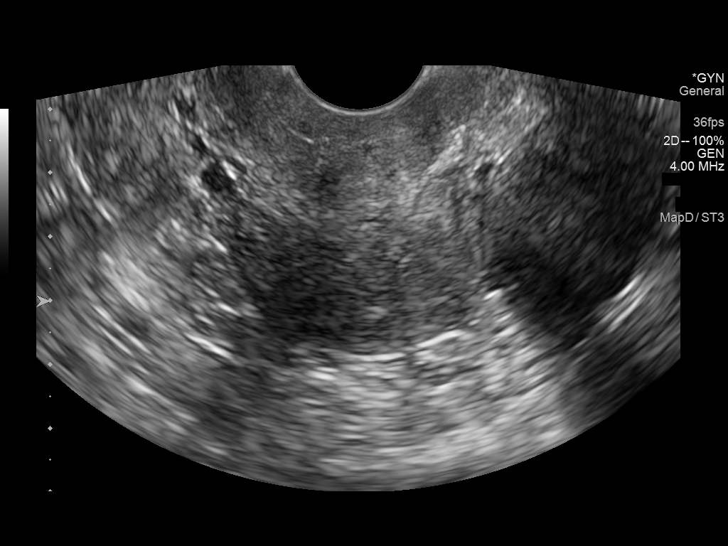
[im 27/41]
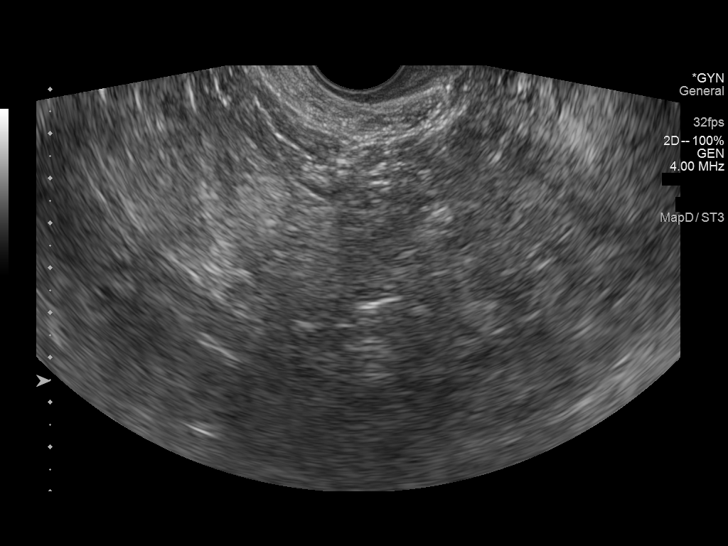
[im 31/41]
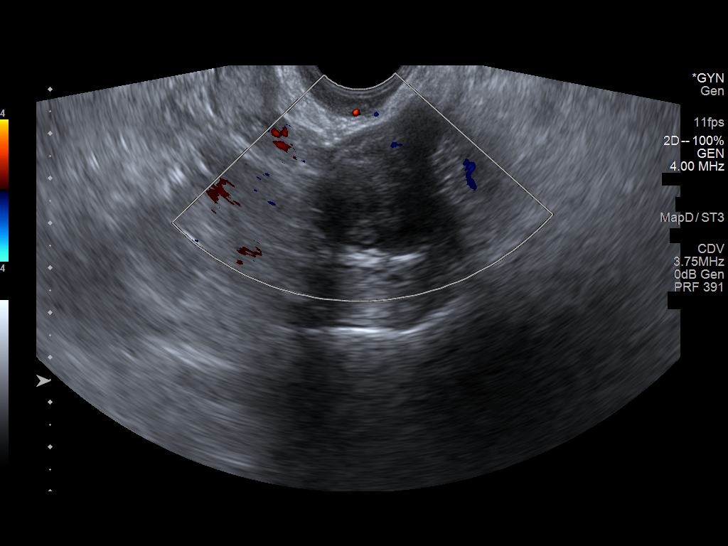
[im 34/41]
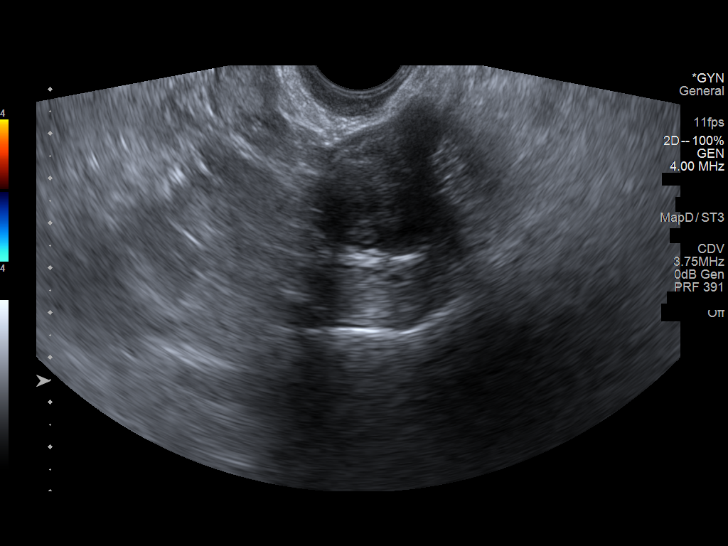
[im 37/41]
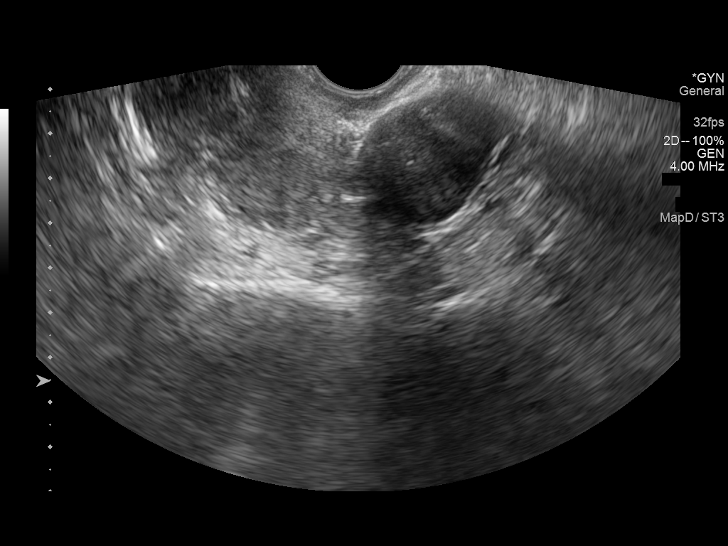
[im 41/41]
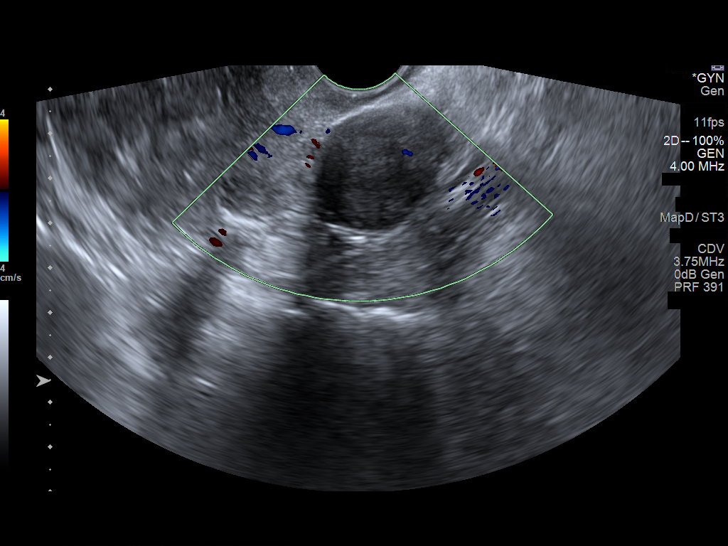

[13 of 25 positions shown; findings below may reference images not displayed]

FINDINGS: Uterus

Measurements: 4.8 x 1.9 x 3.2 cm = volume: 14.8 mL. There is
inhomogeneous echogenicity without discrete focal lesions.

Endometrium

Thickness: 2.1.  No focal abnormality visualized.

Right ovary

Right ovary is not sonographically visualized

Left ovary

There is 3.7 x 3.3 x 3.4 cm smooth marginated, slightly lobulated
solid appearing structure in the left adnexa without demonstrable
follicles. This finding has not changed in comparison with the
PET-CT done on 06/21/2021. This structure measured proximally 2.3 x
2.2 cm in an earlier CT done on 03/30/2012.

Other findings

No abnormal free fluid.
IMPRESSION: Technically limited study.

As far as seen uterus is unremarkable. Right ovary is not
sonographically visualized.

There is 3.7 x 3.3 x 3.4 cm solid-appearing structure with slightly
lobulated margins in the left adnexa without demonstrable follicles.
Differential diagnostic possibilities would include pedunculated
fibroid or slightly prominent atypical appearing left ovary or some
other neoplastic process in the left adnexa. A smaller 2.3 x 2.2 cm
solid appearing structure was seen in the left adnexa in an earlier
CT done on 03/30/2012. Follow-up pelvic sonogram in 4-6 months and
MRI of pelvis if warranted may be considered.

## 2022-02-26 ENCOUNTER — Other Ambulatory Visit: Payer: Self-pay | Admitting: Primary Care

## 2022-02-26 DIAGNOSIS — J449 Chronic obstructive pulmonary disease, unspecified: Secondary | ICD-10-CM

## 2022-03-06 ENCOUNTER — Other Ambulatory Visit: Payer: PRIVATE HEALTH INSURANCE

## 2022-04-10 ENCOUNTER — Other Ambulatory Visit: Payer: Self-pay | Admitting: Primary Care

## 2022-04-10 DIAGNOSIS — E785 Hyperlipidemia, unspecified: Secondary | ICD-10-CM

## 2022-05-19 ENCOUNTER — Other Ambulatory Visit: Payer: Self-pay | Admitting: Primary Care

## 2022-05-19 DIAGNOSIS — F411 Generalized anxiety disorder: Secondary | ICD-10-CM

## 2022-06-13 ENCOUNTER — Encounter: Payer: Self-pay | Admitting: *Deleted

## 2022-06-15 DIAGNOSIS — F411 Generalized anxiety disorder: Secondary | ICD-10-CM

## 2022-06-15 MED ORDER — SERTRALINE HCL 100 MG PO TABS: 100.0000 mg | ORAL_TABLET | Freq: Every day | ORAL | 0 refills | Status: DC

## 2022-09-09 ENCOUNTER — Ambulatory Visit
Admission: EM | Admit: 2022-09-09 | Discharge: 2022-09-09 | Disposition: A | Discharge status: Home / Self Care | Payer: Managed Care, Other (non HMO) | Attending: Urgent Care | Admitting: Urgent Care

## 2022-09-09 ENCOUNTER — Encounter: Payer: Self-pay | Admitting: Emergency Medicine

## 2022-09-09 DIAGNOSIS — J019 Acute sinusitis, unspecified: Secondary | ICD-10-CM | POA: Diagnosis not present

## 2022-09-09 DIAGNOSIS — B9689 Other specified bacterial agents as the cause of diseases classified elsewhere: Secondary | ICD-10-CM

## 2022-09-09 MED ORDER — AMOXICILLIN-POT CLAVULANATE 875-125 MG PO TABS: 1.0000 | ORAL_TABLET | Freq: Two times a day (BID) | ORAL | 0 refills | Status: DC

## 2022-09-09 NOTE — Discharge Instructions (Addendum)
Follow up here or with your primary care provider if your symptoms are worsening or not improving with treatment.     

## 2022-09-09 NOTE — ED Provider Notes (Signed)
[name]    CSN: [id number] Arrival date & time: 09/09/22  1028      History   Chief Complaint Chief Complaint  Patient presents with   Sinus Pressure    Headache    HPI Denise [name] is a 62 y.o. female.    Headache   Patient presents to urgent care with symptoms x 3 weeks.  She endorses congestion, drainage, sinus pressure.  Symptoms worse on the right side of her face around her eye and cheek.  She has been using Benadryl to help with her symptoms.  Past Medical History:  Diagnosis Date   Allergy    BMI 35.0-35.9,adult 03/31/2012   Chickenpox    Cholecystitis with cholelithiasis 03/31/2012   Heart murmur    Hypertension 03/31/2012   Shingles     Patient Active Problem List   Diagnosis Date Noted   Hyperlipidemia 12/27/2021   Iron deficiency anemia due to chronic blood loss 12/27/2021   Adnexal mass 09/08/2021   GAD (generalized anxiety disorder) 06/16/2021   Lung nodule 12/30/2019   Sessile colonic polyp 12/30/2019   AVM (arteriovenous malformation) of colon 12/30/2019   Chest pressure 12/22/2019   Murmur 12/22/2019   Lower extremity edema 12/22/2019   Exertional dyspnea 12/22/2019   Symptomatic anemia 12/22/2019   Cardiomegaly 12/22/2019   Tobacco abuse 12/22/2019   Hyponatremia 12/22/2019   COPD (chronic obstructive pulmonary disease) ([place]) 12/22/2019   Former smoker 08/31/2019   Hypertension 03/31/2012    Past Surgical History:  Procedure Laterality Date   BIOPSY  12/24/2019   Procedure: BIOPSY;  Surgeon: Milus Banister, MD;  Location: [place];  Service: Endoscopy;;   CHOLECYSTECTOMY  03/30/2012   Procedure: LAPAROSCOPIC CHOLECYSTECTOMY WITH INTRAOPERATIVE CHOLANGIOGRAM;  Surgeon: Edward Jolly, MD;  Location: [facility];  Service: General;  Laterality: N/A;   COLONOSCOPY WITH PROPOFOL N/A 12/24/2019   Procedure: COLONOSCOPY WITH PROPOFOL;  Surgeon: Milus Banister, MD;  Location: [facility] ENDOSCOPY;  Service: Endoscopy;   Laterality: N/A;   ESOPHAGOGASTRODUODENOSCOPY (EGD) WITH PROPOFOL N/A 12/24/2019   Procedure: ESOPHAGOGASTRODUODENOSCOPY (EGD) WITH PROPOFOL;  Surgeon: Milus Banister, MD;  Location: [facility] ENDOSCOPY;  Service: Endoscopy;  Laterality: N/A;   HOT HEMOSTASIS N/A 12/24/2019   Procedure: HOT HEMOSTASIS (ARGON PLASMA COAGULATION/BICAP);  Surgeon: Milus Banister, MD;  Location: [facility] ENDOSCOPY;  Service: Endoscopy;  Laterality: N/A;   POLYPECTOMY  12/24/2019   Procedure: POLYPECTOMY;  Surgeon: Milus Banister, MD;  Location: [facility] ENDOSCOPY;  Service: Endoscopy;;   TUBAL LIGATION      OB History   No obstetric history on file.      Home Medications    Prior to Admission medications   Medication Sig Start Date End Date Taking? Authorizing Provider  albuterol (VENTOLIN HFA) 108 (90 Base) MCG/ACT inhaler INHALE 2 PUFFS INTO THE LUNGS EVERY 4-6 HOURS AS NEEDED FOR TIGHTNESS/WHEEZING 02/26/22   Pleas Koch, NP  amLODipine (NORVASC) 10 MG tablet TAKE 1 TABLET BY MOUTH EVERY DAY FOR BLOOD PRESSURE 01/13/22   Pleas Koch, NP  Ascorbic Acid (VITAMIN C) 1000 MG tablet Take 1,000 mg by mouth daily.    [provider]  aspirin EC 81 MG tablet Take 81 mg by mouth daily.    [provider]  atorvastatin (LIPITOR) 40 MG tablet TAKE 1 TABLET BY MOUTH EVERY DAY FOR CHOLESTEROL 04/10/22   Pleas Koch, NP  famotidine (PEPCID) 20 MG tablet Take 20 mg by mouth daily as needed for heartburn or indigestion.  [provider]  ferrous gluconate (FERGON) 324 MG tablet TAKE 1 TABLET BY MOUTH DAILY WITH BREAKFAST 12/28/21   Pleas Koch, NP  fluticasone-salmeterol (ADVAIR) 250-50 MCG/ACT AEPB Inhale 1 puff into the lungs in the morning and at bedtime. 12/27/21   Pleas Koch, NP  lisinopril (ZESTRIL) 30 MG tablet Take 1 tablet (30 mg total) by mouth daily. for blood pressure. 12/27/21   Pleas Koch, NP  pyridOXINE (VITAMIN B-6) 25 MG tablet Take 25 mg by mouth daily.     [provider]  sertraline (ZOLOFT) 100 MG tablet Take 1 tablet (100 mg total) by mouth daily. For anxiety 06/15/22   Michela Pitcher, NP  vitamin B-12 (CYANOCOBALAMIN) 1000 MCG tablet Take 1,000 mcg by mouth daily.    [provider]  vitamin E 180 MG (400 UNITS) capsule Take 800 Units by mouth daily.    [provider]    Family History Family History  Problem Relation Age of Onset   Arthritis Mother    Cervical cancer Mother    Heart disease Father    Hypertension Father     Social History Social History   Tobacco Use   Smoking status: Former    Packs/day: 0.75    Years: 34.00    Total pack years: 25.50    Types: Cigarettes    Quit date: 05/07/2019    Years since quitting: 3.3   Smokeless tobacco: Never  Vaping Use   Vaping Use: Never used  Substance Use Topics   Alcohol use: No   Drug use: No     Allergies   Codeine   Review of Systems Review of Systems  Neurological:  Positive for headaches.     Physical Exam Triage Vital Signs ED Triage Vitals  Enc Vitals Group     BP 09/09/22 1204 (!) 163/81     Pulse Rate 09/09/22 1204 (!) 103     Resp 09/09/22 1204 18     Temp 09/09/22 1204 98.2 F (36.8 C)     Temp Source 09/09/22 1204 Oral     SpO2 09/09/22 1204 95 %     Weight --      Height --      Head Circumference --      Peak Flow --      Pain Score 09/09/22 1203 7     Pain Loc --      Pain Edu? --      Excl. in [place]? --    No data found.  Updated Vital Signs BP (!) 163/81 (BP Location: Left Arm)   Pulse (!) 103   Temp 98.2 F (36.8 C) (Oral)   Resp 18   SpO2 95%   Visual Acuity Right Eye Distance:   Left Eye Distance:   Bilateral Distance:    Right Eye Near:   Left Eye Near:    Bilateral Near:     Physical Exam Vitals reviewed.  Constitutional:      Appearance: She is well-developed. She is ill-appearing.  Skin:    General: Skin is warm and dry.  Neurological:     Mental Status: She is alert and  oriented to person, place, and time.  Psychiatric:        Mood and Affect: Mood normal.        Behavior: Behavior normal.      UC Treatments / Results  Labs (all labs ordered are listed, but only abnormal results are displayed) Labs Reviewed -  No data to display  EKG   Radiology No results found.  Procedures Procedures (including critical care time)  Medications Ordered in UC Medications - No data to display  Initial Impression / Assessment and Plan / UC Course  I have reviewed the triage vital signs and the nursing notes.  Pertinent labs & imaging results that were available during my care of the patient were reviewed by me and considered in my medical decision making (see chart for details).   Patient is afebrile here without recent antipyretics. Satting well on room air. Overall is well appearing, well hydrated, without respiratory distress.  Right-sided sinus tenderness, especially maxillary.  Symptoms are suggestive of secondary bacterial sinus infection and will treat with Augmentin.  Final Clinical Impressions(s) / UC Diagnoses   Final diagnoses:  None   Discharge Instructions   None    ED Prescriptions   None    PDMP not reviewed this encounter.   Rose Phi, [place] 09/09/22 1211

## 2022-09-09 NOTE — ED Triage Notes (Signed)
Pt c/o congestion, drainage and sinus pressure x 3 weeks.

## 2022-10-15 ENCOUNTER — Other Ambulatory Visit: Payer: Self-pay | Admitting: Primary Care

## 2022-10-15 DIAGNOSIS — F411 Generalized anxiety disorder: Secondary | ICD-10-CM

## 2022-10-15 NOTE — Telephone Encounter (Signed)
Has she been out of her Zoloft for a while? Last refill was for 30 day supply in November 2023.  Also, needs CPE scheduled for late May

## 2022-10-16 NOTE — Telephone Encounter (Signed)
Unable to reach patient. Left voicemail to return call to our office.   

## 2022-10-17 NOTE — Telephone Encounter (Signed)
Patient states she does not need a refill on this medication and would like it declined. Scheduled CPE 12/27/22

## 2022-11-15 ENCOUNTER — Other Ambulatory Visit: Payer: Self-pay | Admitting: Primary Care

## 2022-11-15 DIAGNOSIS — D500 Iron deficiency anemia secondary to blood loss (chronic): Secondary | ICD-10-CM

## 2022-12-15 ENCOUNTER — Other Ambulatory Visit: Payer: Self-pay | Admitting: Primary Care

## 2022-12-15 DIAGNOSIS — I10 Essential (primary) hypertension: Secondary | ICD-10-CM

## 2022-12-27 ENCOUNTER — Ambulatory Visit: Payer: Managed Care, Other (non HMO) | Admitting: Primary Care

## 2022-12-27 ENCOUNTER — Other Ambulatory Visit (HOSPITAL_COMMUNITY)
Admission: RE | Admit: 2022-12-27 | Discharge: 2022-12-27 | Disposition: A | Discharge status: Home / Self Care | Payer: Managed Care, Other (non HMO) | Source: Ambulatory Visit | Attending: Primary Care | Admitting: Primary Care

## 2022-12-27 ENCOUNTER — Encounter: Payer: Self-pay | Admitting: Primary Care

## 2022-12-27 VITALS — BP 136/80 | HR 91 | Temp 98.2°F | Ht 64.0 in | Wt 203.0 lb

## 2022-12-27 DIAGNOSIS — Z1231 Encounter for screening mammogram for malignant neoplasm of breast: Secondary | ICD-10-CM

## 2022-12-27 DIAGNOSIS — R911 Solitary pulmonary nodule: Secondary | ICD-10-CM

## 2022-12-27 DIAGNOSIS — M25532 Pain in left wrist: Secondary | ICD-10-CM

## 2022-12-27 DIAGNOSIS — J449 Chronic obstructive pulmonary disease, unspecified: Secondary | ICD-10-CM

## 2022-12-27 DIAGNOSIS — N9489 Other specified conditions associated with female genital organs and menstrual cycle: Secondary | ICD-10-CM

## 2022-12-27 DIAGNOSIS — E785 Hyperlipidemia, unspecified: Secondary | ICD-10-CM | POA: Diagnosis not present

## 2022-12-27 DIAGNOSIS — K5520 Angiodysplasia of colon without hemorrhage: Secondary | ICD-10-CM

## 2022-12-27 DIAGNOSIS — K635 Polyp of colon: Secondary | ICD-10-CM

## 2022-12-27 DIAGNOSIS — I6523 Occlusion and stenosis of bilateral carotid arteries: Secondary | ICD-10-CM | POA: Insufficient documentation

## 2022-12-27 DIAGNOSIS — I2510 Atherosclerotic heart disease of native coronary artery without angina pectoris: Secondary | ICD-10-CM | POA: Insufficient documentation

## 2022-12-27 DIAGNOSIS — G8929 Other chronic pain: Secondary | ICD-10-CM

## 2022-12-27 DIAGNOSIS — D500 Iron deficiency anemia secondary to blood loss (chronic): Secondary | ICD-10-CM

## 2022-12-27 DIAGNOSIS — Z124 Encounter for screening for malignant neoplasm of cervix: Secondary | ICD-10-CM | POA: Diagnosis present

## 2022-12-27 DIAGNOSIS — F411 Generalized anxiety disorder: Secondary | ICD-10-CM

## 2022-12-27 DIAGNOSIS — R8290 Unspecified abnormal findings in urine: Secondary | ICD-10-CM | POA: Diagnosis not present

## 2022-12-27 DIAGNOSIS — Z23 Encounter for immunization: Secondary | ICD-10-CM

## 2022-12-27 DIAGNOSIS — M25531 Pain in right wrist: Secondary | ICD-10-CM

## 2022-12-27 DIAGNOSIS — Z0001 Encounter for general adult medical examination with abnormal findings: Secondary | ICD-10-CM | POA: Insufficient documentation

## 2022-12-27 DIAGNOSIS — I10 Essential (primary) hypertension: Secondary | ICD-10-CM

## 2022-12-27 LAB — CBC
HCT: 44.5 % (ref 36.0–46.0)
Hemoglobin: 14.6 g/dL (ref 12.0–15.0)
MCHC: 32.7 g/dL (ref 30.0–36.0)
MCV: 95.2 fl (ref 78.0–100.0)
Platelets: 325 10*3/uL (ref 150.0–400.0)
RBC: 4.67 Mil/uL (ref 3.87–5.11)
RDW: 14.4 % (ref 11.5–15.5)
WBC: 9.9 10*3/uL (ref 4.0–10.5)

## 2022-12-27 LAB — LIPID PANEL
Cholesterol: 173 mg/dL (ref 0–200)
HDL: 53.4 mg/dL (ref >39.00)
LDL Cholesterol: 96 mg/dL (ref 0–99)
NonHDL: 119.62
Total CHOL/HDL Ratio: 3
Triglycerides: 118 mg/dL (ref 0.0–149.0)
VLDL: 23.6 mg/dL (ref 0.0–40.0)

## 2022-12-27 LAB — IBC + FERRITIN
Ferritin: 23 ng/mL (ref 10.0–291.0)
Iron: 33 ug/dL — ABNORMAL LOW (ref 42–145)
Saturation Ratios: 8.9 % — ABNORMAL LOW (ref 20.0–50.0)
TIBC: 369.6 ug/dL (ref 250.0–450.0)
Transferrin: 264 mg/dL (ref 212.0–360.0)

## 2022-12-27 LAB — COMPREHENSIVE METABOLIC PANEL
ALT: 18 U/L (ref 0–35)
AST: 19 U/L (ref 0–37)
Albumin: 4 g/dL (ref 3.5–5.2)
Alkaline Phosphatase: 86 U/L (ref 39–117)
BUN: 9 mg/dL (ref 6–23)
CO2: 28 mEq/L (ref 19–32)
Calcium: 9.6 mg/dL (ref 8.4–10.5)
Chloride: 101 mEq/L (ref 96–112)
Creatinine, Ser: 0.72 mg/dL (ref 0.40–1.20)
GFR: 90.27 mL/min (ref >60.00)
Glucose, Bld: 104 mg/dL — ABNORMAL HIGH (ref 70–99)
Potassium: 4.2 mEq/L (ref 3.5–5.1)
Sodium: 136 mEq/L (ref 135–145)
Total Bilirubin: 0.5 mg/dL (ref 0.2–1.2)
Total Protein: 6.8 g/dL (ref 6.0–8.3)

## 2022-12-27 LAB — POC URINALSYSI DIPSTICK (AUTOMATED)
Bilirubin, UA: NEGATIVE
Blood, UA: NEGATIVE
Glucose, UA: NEGATIVE
Ketones, UA: NEGATIVE
Leukocytes, UA: NEGATIVE
Nitrite, UA: NEGATIVE
Protein, UA: NEGATIVE
Spec Grav, UA: <=1.005 — AB (ref 1.010–1.025)
Urobilinogen, UA: 0.2 E.U./dL
pH, UA: 6.5 (ref 5.0–8.0)

## 2022-12-27 LAB — HEMOGLOBIN A1C: Hgb A1c MFr Bld: 5.4 % (ref 4.6–6.5)

## 2022-12-27 NOTE — Assessment & Plan Note (Signed)
Uncontrolled.  Discussed to notify me in the future if she decides to stop a medication.  Continue off Zoloft for now as she refuses to resume at this time. She declines any other treatment.

## 2022-12-27 NOTE — Assessment & Plan Note (Signed)
First Shingrix vaccine provided today. Pap smear due, completed today. Mammogram due, orders placed. Colonoscopy UTD, due 2028  Discussed the importance of a healthy diet and regular exercise in order for weight loss, and to reduce the risk of further co-morbidity.  Exam stable. Labs pending.  Follow up in 1 year for repeat physical.

## 2022-12-27 NOTE — Assessment & Plan Note (Signed)
UA today negative. Pelvic exam grossly unremarkable.

## 2022-12-27 NOTE — Assessment & Plan Note (Signed)
Colonoscopy UTD, due 2028. ?

## 2022-12-27 NOTE — Assessment & Plan Note (Signed)
Overdue for CT chest, she never completed this last year as recommended.  She agrees to have this done.   CT chest ordered and pending.  Commended her on smoking cessation.

## 2022-12-27 NOTE — Assessment & Plan Note (Signed)
Reviewed colonoscopy from 2021, from these notes she is due in 2028.

## 2022-12-27 NOTE — Assessment & Plan Note (Signed)
Controlled.  Continue Advair 250-50 mg BID for which she uses once daily mostly. Continue albuterol inhaler PRN.  Repeat CT chest pending.

## 2022-12-27 NOTE — Patient Instructions (Addendum)
You will either be contacted via phone regarding your referral to orthopedics, your CT scan, the carotid artery ultrasound, and your pelvic ultrasound, or you may receive a letter on your MyChart portal from our referral team with instructions for scheduling an appointment. Please let us know if you have not been contacted by anyone within two weeks.  Stop by the lab prior to leaving today. I will notify you of your results once received.   [facility].   Schedule a nurse visit for your second shingles vaccine. This is due in 2-6 months.  It was a pleasure to see you today!

## 2022-12-27 NOTE — Assessment & Plan Note (Addendum)
Noted on PET scan from 2022. Discussed with patient. Recommended bilateral carotid US for which she agrees. Ordered and pending.  Repeat lipid panel pending. Continue atorvastatin 40 mg daily. Add aspirin 81 mg daily.   LDL goal of <50.

## 2022-12-27 NOTE — Assessment & Plan Note (Signed)
Suspicious for carpal tunnel syndrome. Referral placed to orthopedics.

## 2022-12-27 NOTE — Assessment & Plan Note (Signed)
Repeat lipid panel pending.  LDL goal of <50.

## 2022-12-27 NOTE — Assessment & Plan Note (Addendum)
Noted on PET scan from 2022, discussed with patient today.  Continue atorvastatin 40 mg daily. LDL goal of <50.  She did not return for repeat lipids last year as recommended. Repeat lipid panel pending.  She does have risk factors for a heart attack given her CAD, smoking history, and sedentary lifestyle.  Recommended cardiology referral for which she declines.

## 2022-12-27 NOTE — Assessment & Plan Note (Signed)
Above goal on initial check, improved on recheck.  Continue lisinopril 30 mg daily, amlodipine 10 mg daily. Repeat CMP pending.

## 2022-12-27 NOTE — Progress Notes (Signed)
Subjective:    Patient ID: Denise [name], female    DOB: 26-Oct-1960, 62 y.o.   MRN: [id number]  HPI  Denise [name] is a very pleasant 62 y.o. female who presents today for complete physical and follow up of chronic conditions.  She has noticed a 4-5 month history of intermittent foul smelling urine. No foul smelling urine now. She denies hematuria, dysuria.   She would also like to discuss joint pain. Chronic joint pain to the hands, right > left, mostly located to the right wrist with numbness to her fingers. Her pain will shoot up to the right forearm, occurs multiple times daily. 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 She's tried wearing braces at night for wrist and thumb support. She doe notice pain to the first metacarpal joint, less achy and throbbing than right.   She weaned herself off of Zoloft last year given visual changes. She did not tell us. She saw her eye doctor last August who said they couldn't find anything. She notices photophobia with fluorescent lighting, bright lights, sunshine, and bright lights and shadow. Since coming off Zoloft her anxiety has increased. Symptoms include feeling on edge, nervous, constant worrying. She denies headaches, dizziness. She did get some new glasses last year but this hasn't helped.   She underwent CT chest in September 2022 which revealed slight enlargement of left upper lobe nodule, PET scan was recommended.  PET scan completed in November 2022 which revealed slow interval enlargement of the nodule but follow-up CT chest 6 months later was recommended.  CT chest was ordered in May 2023, however she did not complete her CT chest.   The PET scan from 2022 also revealed left adnexal enlargement so pelvic/transvaginal ultrasound was ordered.  Her ultrasound revealed a solid appearing structure to the left adnexa, neoplasm cannot be ruled out.  Repeat pelvic ultrasound is recommended 4 to 6 months later.   Pelvic/internal ultrasound was ordered in May 2023, however she did not have this done.    Today she is agreeable to having both her CT chest and transvaginal/pelvic ultrasound completed.  Immunizations: -Tetanus: Completed in 2022 -Shingles: Never completed  Diet: Fair diet.  Exercise: No regular exercise. Sedentary at work.  Eye exam: Completed in August 2023 Dental exam: Completed several years ago  Pap Smear: Follows with [facility] Mammogram: overdue  Colonoscopy: Completed in 2021, due 2028 Lung Cancer Screening: Completed CT chest and PET scan in 2022. Recommendations were for repeat CT chest in 6 months for which was ordered but she did not complete.   She quit smoking 08/29/22. She is using the nicotine patch and follows with a program through her work.   BP Readings from Last 3 Encounters:  12/27/22 136/80  09/09/22 (!) 163/81  12/27/21 134/74   She checks her BP at home on occasion, no recent BP check. She thinks she runs 130's/70's-80's.       Review of Systems  Constitutional:  Negative for unexpected weight change.  HENT:  Negative for rhinorrhea.   Eyes:  Positive for visual disturbance.  Respiratory:  Negative for cough and shortness of breath.   Cardiovascular:  Negative for chest pain.  Gastrointestinal:  Negative for constipation and diarrhea.  Genitourinary:  Negative for difficulty urinating.  Musculoskeletal:  Positive for arthralgias. Negative for myalgias.  Skin:  Negative for rash.  Allergic/Immunologic: Negative for environmental allergies.  Neurological:  Positive for numbness. Negative for dizziness and  headaches.  Psychiatric/Behavioral:  The patient is nervous/anxious.          Past Medical History:  Diagnosis Date   Allergy    BMI 35.0-35.9,adult 03/31/2012   Chickenpox    Cholecystitis with cholelithiasis 03/31/2012   Heart murmur    Hypertension 03/31/2012   Shingles     Social History   Socioeconomic History    Marital status: Widowed    Spouse name: Not on file   Number of children: Not on file   Years of education: Not on file   Highest education level: Not on file  Occupational History   Not on file  Tobacco Use   Smoking status: Former    Packs/day: 0.75    Years: 34.00    Additional pack years: 0.00    Total pack years: 25.50    Types: Cigarettes    Quit date: 05/07/2019    Years since quitting: 3.6   Smokeless tobacco: Never  Vaping Use   Vaping Use: Never used  Substance and Sexual Activity   Alcohol use: No   Drug use: No   Sexual activity: Never    Birth control/protection: None, Post-menopausal  Other Topics Concern   Not on file  Social History Narrative   Widow.    2 children.   Works in Clinical biochemist.   Enjoys reading, cooking.    Social Determinants of Health   Financial Resource Strain: Not on file  Food Insecurity: Not on file  Transportation Needs: Not on file  Physical Activity: Not on file  Stress: Not on file  Social Connections: Not on file  Intimate Partner Violence: Not on file    Past Surgical History:  Procedure Laterality Date   BIOPSY  12/24/2019   Procedure: BIOPSY;  Surgeon: Rachael Fee, MD;  Location: [facility] ENDOSCOPY;  Service: Endoscopy;;   CHOLECYSTECTOMY  03/30/2012   Procedure: LAPAROSCOPIC CHOLECYSTECTOMY WITH INTRAOPERATIVE CHOLANGIOGRAM;  Surgeon: Mariella Saa, MD;  Location: [facility] ORS;  Service: General;  Laterality: N/A;   COLONOSCOPY WITH PROPOFOL N/A 12/24/2019   Procedure: COLONOSCOPY WITH PROPOFOL;  Surgeon: Rachael Fee, MD;  Location[facility] END[facility]COPY;  Service: Endoscopy;  Laterality: N/A;   ESOPHAGOGASTRODUODENOSCOPY (EGD) WITH PROPOFOL N/A 12/24/2019   Procedure: ESOPHAGOGASTRODUODENOSCOPY (EGD) WITH PROPOFOL;  Surgeon: Rachael Fee, MD;  Location: [facility] ENDOSCOPY;  Service: Endoscopy;  Laterality: N/A;   HOT HEMOSTASIS N/A 12/24/2019   Procedure: HOT HEMOSTASIS (ARGON PLASMA COAGULATION/BICAP);  Surgeon: Rachael Fee, MD;  Location: [facility] ENDOSCOPY;  Service: Endoscopy;  Laterality: N/A;   POLYPECTOMY  12/24/2019   Procedure: POLYPECTOMY;  Surgeon: Rachael Fee, MD;  Location: [facility] ENDOSCOPY;  Service: Endoscopy;;   TUBAL LIGATION      Family History  Problem Relation Age of Onset   Arthritis Mother    Cervical cancer Mother    Heart disease Father    Hypertension Father     Allergies  Allergen Reactions   Codeine Itching    Current Outpatient Medications on File Prior to Visit  Medication Sig Dispense Refill   albuterol (VENTOLIN HFA) 108 (90 Base) MCG/ACT inhaler INHALE 2 PUFFS INTO THE LUNGS EVERY 4-6 HOURS AS NEEDED FOR TIGHTNESS/WHEEZING 8.5 each 0   amLODipine (NORVASC) 10 MG tablet TAKE 1 TABLET BY MOUTH EVERY DAY FOR BLOOD PRESSURE 90 tablet 0   Ascorbic Acid (VITAMIN C) 1000 MG tablet Take 1,000 mg by mouth daily.     aspirin EC 81 MG tablet Take 81 mg by mouth daily.  atorvastatin (LIPITOR) 40 MG tablet TAKE 1 TABLET BY MOUTH EVERY DAY FOR CHOLESTEROL 90 tablet 2   famotidine (PEPCID) 20 MG tablet Take 20 mg by mouth daily as needed for heartburn or indigestion.     ferrous gluconate (FERGON) 324 MG tablet TAKE 1 TABLET BY MOUTH DAILY WITH BREAKFAST 90 tablet 3   fluticasone-salmeterol (ADVAIR) 250-50 MCG/ACT AEPB Inhale 1 puff into the lungs in the morning and at bedtime. 60 each 5   lisinopril (ZESTRIL) 30 MG tablet TAKE 1 TABLET (30 MG TOTAL) BY MOUTH DAILY. FOR BLOOD PRESSURE. 90 tablet 0   pyridOXINE (VITAMIN B-6) 25 MG tablet Take 25 mg by mouth daily.     vitamin B-12 (CYANOCOBALAMIN) 1000 MCG tablet Take 1,000 mcg by mouth daily.     vitamin E 180 MG (400 UNITS) capsule Take 800 Units by mouth daily.     sertraline (ZOLOFT) 100 MG tablet TAKE 1 TABLET (100 MG TOTAL) BY MOUTH DAILY. FOR ANXIETY 90 tablet 0   No current facility-administered medications on file prior to visit.    BP 136/80   Pulse 91   Temp 98.2 F (36.8 C) (Temporal)   Ht 5\' 4"  (1.626 m)   Wt  203 lb (92.1 kg)   SpO2 98%   BMI 34.84 kg/m  Objective:   Physical Exam Exam conducted with a chaperone present.  HENT:     Right Ear: Tympanic membrane and ear canal normal.     Left Ear: Tympanic membrane and ear canal normal.     Nose: Nose normal.  Eyes:     Conjunctiva/sclera: Conjunctivae normal.     Pupils: Pupils are equal, round, and reactive to light.  Neck:     Thyroid: No thyromegaly.  Cardiovascular:     Rate and Rhythm: Normal rate and regular rhythm.     Heart sounds: No murmur heard. Pulmonary:     Effort: Pulmonary effort is normal.     Breath sounds: Normal breath sounds. No rales.  Abdominal:     General: Bowel sounds are normal.     Palpations: Abdomen is soft.     Tenderness: There is no abdominal tenderness.  Genitourinary:    Labia:        Right: No tenderness or lesion.        Left: No tenderness or lesion.      Vagina: Normal.     Cervix: Normal.     Uterus: Normal.      Adnexa: Right adnexa normal and left adnexa normal.  Musculoskeletal:        General: Normal range of motion.     Cervical back: Neck supple.  Lymphadenopathy:     Cervical: No cervical adenopathy.  Skin:    General: Skin is warm and dry.     Findings: No rash.  Neurological:     Mental Status: She is alert and oriented to person, place, and time.     Cranial Nerves: No cranial nerve deficit.     Deep Tendon Reflexes: Reflexes are normal and symmetric.          Assessment & Plan:  Encounter for annual general medical examination with abnormal findings in adult Assessment & Plan: First Shingrix vaccine provided today. Pap smear due, completed today. Mammogram due, orders placed. Colonoscopy UTD, due 2028  Discussed the importance of a healthy diet and regular exercise in order for weight loss, and to reduce the risk of further co-morbidity.  Exam stable. Labs pending.  Follow  up in 1 year for repeat physical.    AVM (arteriovenous malformation) of  colon Assessment & Plan: Reviewed colonoscopy from 2021, from these notes she is due in 2028.   Lung nodule Assessment & Plan: Overdue for CT chest, she never completed this last year as recommended.  She agrees to have this done.   CT chest ordered and pending.  Commended her on smoking cessation.   Orders: -     CT CHEST WO CONTRAST; Future  Sessile colonic polyp Assessment & Plan: Colonoscopy UTD, due 2028.   Adnexal mass Assessment & Plan: She is overdue for pelvic ultrasound, did not complete last year as recommended. She agrees to have this done.  [place] ordered and pending. Await results.   Orders: -     US PELVIC COMPLETE WITH TRANSVAGINAL; Future  Coronary artery disease involving native coronary artery of native heart without angina pectoris Assessment & Plan: Noted on PET scan from 2022, discussed with patient today.  Continue atorvastatin 40 mg daily. LDL goal of <50.  She did not return for repeat lipids last year as recommended. Repeat lipid panel pending.  She does have risk factors for a heart attack given her CAD, smoking history, and sedentary lifestyle.  Recommended cardiology referral for which she declines.    Hyperlipidemia, unspecified hyperlipidemia type Assessment & Plan: Repeat lipid panel pending.  LDL goal of <50.   Orders: -     CBC -     Comprehensive metabolic panel -     Lipid panel -     Hemoglobin A1c  Chronic obstructive pulmonary disease, unspecified COPD type (HCC) Assessment & Plan: Controlled.  Continue Advair 250-50 mg BID for which she uses once daily mostly. Continue albuterol inhaler PRN.  Repeat CT chest pending.   Screening mammogram for breast cancer -     3D Screening Mammogram, Left and Right; Future  Screening for cervical cancer -     Cytology - PAP  Chronic pain of both wrists Assessment & Plan: Suspicious for carpal tunnel syndrome. Referral placed to orthopedics.   Orders: -     Ambulatory  referral to Orthopedic Surgery  Foul smelling urine Assessment & Plan: UA today negative. Pelvic exam grossly unremarkable.    Orders: -     POCT Urinalysis Dipstick (Automated)  Carotid atherosclerosis, bilateral Assessment & Plan: Noted on PET scan from 2022. Discussed with patient. Recommended bilateral carotid US for which she agrees. Ordered and pending.  Repeat lipid panel pending. Continue atorvastatin 40 mg daily. Add aspirin 81 mg daily.   LDL goal of <50.  Orders: -     VAS US CAROTID; Future  GAD (generalized anxiety disorder) Assessment & Plan: Uncontrolled.  Discussed to notify me in the future if she decides to stop a medication.  Continue off Zoloft for now as she refuses to resume at this time. She declines any other treatment.   Primary hypertension Assessment & Plan: Above goal on initial check, improved on recheck.  Continue lisinopril 30 mg daily, amlodipine 10 mg daily. Repeat CMP pending.   Iron deficiency anemia due to chronic blood loss -     IBC + Ferritin        Doreene Nest, NP

## 2022-12-27 NOTE — Assessment & Plan Note (Signed)
She is overdue for pelvic ultrasound, did not complete last year as recommended. She agrees to have this done.  [place] ordered and pending. Await results.

## 2022-12-28 ENCOUNTER — Other Ambulatory Visit: Payer: Self-pay | Admitting: Primary Care

## 2022-12-28 ENCOUNTER — Ambulatory Visit: Payer: Managed Care, Other (non HMO)

## 2022-12-28 ENCOUNTER — Encounter: Payer: Self-pay | Admitting: *Deleted

## 2022-12-28 DIAGNOSIS — E611 Iron deficiency: Secondary | ICD-10-CM

## 2023-01-01 LAB — PATHOLOGIST SMEAR REVIEW

## 2023-01-02 ENCOUNTER — Telehealth: Payer: Self-pay | Admitting: Primary Care

## 2023-01-02 LAB — CYTOLOGY - PAP
Comment: NEGATIVE
Diagnosis: NEGATIVE
High risk HPV: NEGATIVE

## 2023-01-02 NOTE — Telephone Encounter (Signed)
Patient returned call to the office regarding labs,patient says she would not be available to speak for the rest of the day and will try calling again tomorrow. Please advise, thank you.

## 2023-01-02 NOTE — Telephone Encounter (Signed)
See result note for further documentation.

## 2023-01-03 ENCOUNTER — Other Ambulatory Visit: Payer: Self-pay | Admitting: Primary Care

## 2023-01-03 DIAGNOSIS — E785 Hyperlipidemia, unspecified: Secondary | ICD-10-CM

## 2023-01-03 MED ORDER — ATORVASTATIN CALCIUM 80 MG PO TABS
80.0000 mg | ORAL_TABLET | Freq: Every day | ORAL | 3 refills | Status: DC
Start: 2023-01-03 — End: 2024-01-15

## 2023-01-04 ENCOUNTER — Other Ambulatory Visit (INDEPENDENT_AMBULATORY_CARE_PROVIDER_SITE_OTHER): Payer: Managed Care, Other (non HMO)

## 2023-01-04 ENCOUNTER — Ambulatory Visit (INDEPENDENT_AMBULATORY_CARE_PROVIDER_SITE_OTHER): Payer: Managed Care, Other (non HMO) | Admitting: Orthopaedic Surgery

## 2023-01-04 DIAGNOSIS — G5601 Carpal tunnel syndrome, right upper limb: Secondary | ICD-10-CM | POA: Diagnosis not present

## 2023-01-04 DIAGNOSIS — M1812 Unilateral primary osteoarthritis of first carpometacarpal joint, left hand: Secondary | ICD-10-CM

## 2023-01-04 DIAGNOSIS — M1811 Unilateral primary osteoarthritis of first carpometacarpal joint, right hand: Secondary | ICD-10-CM

## 2023-01-04 DIAGNOSIS — G5602 Carpal tunnel syndrome, left upper limb: Secondary | ICD-10-CM | POA: Diagnosis not present

## 2023-01-04 MED ORDER — LIDOCAINE HCL 1 % IJ SOLN
1.0000 mL | INTRAMUSCULAR | Status: AC | PRN
Start: 2023-01-04 — End: 2023-01-04
  Administered 2023-01-04: 1 mL

## 2023-01-04 MED ORDER — BUPIVACAINE HCL 0.5 % IJ SOLN
1.0000 mL | INTRAMUSCULAR | Status: AC | PRN
Start: 2023-01-04 — End: 2023-01-04
  Administered 2023-01-04: 1 mL

## 2023-01-04 MED ORDER — BUPIVACAINE HCL 0.5 % IJ SOLN
0.3000 mL | INTRAMUSCULAR | Status: AC | PRN
Start: 2023-01-04 — End: 2023-01-04
  Administered 2023-01-04: .3 mL

## 2023-01-04 MED ORDER — METHYLPREDNISOLONE ACETATE 40 MG/ML IJ SUSP
0.3000 mg | INTRAMUSCULAR | Status: AC | PRN
Start: 2023-01-04 — End: 2023-01-04
  Administered 2023-01-04: .3 mg

## 2023-01-04 MED ORDER — LIDOCAINE HCL 1 % IJ SOLN
0.3000 mL | INTRAMUSCULAR | Status: AC | PRN
Start: 2023-01-04 — End: 2023-01-04
  Administered 2023-01-04: .3 mL

## 2023-01-04 MED ORDER — METHYLPREDNISOLONE ACETATE 40 MG/ML IJ SUSP
40.0000 mg | INTRAMUSCULAR | Status: AC | PRN
Start: 2023-01-04 — End: 2023-01-04
  Administered 2023-01-04: 40 mg

## 2023-01-04 NOTE — Progress Notes (Signed)
Office Visit Note   Patient: Denise [name]           Date of Birth: 1961/07/14           MRN: [id number] Visit Date: 01/04/2023              Requested by: Doreene Nest, NP [address] [place],  [place] [address] PCP: Doreene Nest, NP   Assessment & Plan: Visit Diagnoses:  1. Primary osteoarthritis of first carpometacarpal joint of right hand   2. Primary osteoarthritis of first carpometacarpal joint of left hand   3. Right carpal tunnel syndrome   4. Left carpal tunnel syndrome     Plan: Impression is bilateral carpal tunnel syndrome and bilateral thumb CMC osteoarthritis.  Her symptoms are more severe in the right hand and based on treatment options she elected to undergo a CMC injection as well as a carpal tunnel injection.  We will provide her with a nighttime carpal tunnel splint.    Follow-Up Instructions: No follow-ups on file.   Orders:  Orders Placed This Encounter  Procedures   XR Wrist Complete Left   XR Wrist Complete Right   No orders of the defined types were placed in this encounter.     Procedures: Hand/UE Inj: R carpal tunnel for carpal tunnel syndrome on 01/04/2023 10:10 AM Indications: pain Details: 25 G needle Medications: 1 mL lidocaine 1 %; 1 mL bupivacaine 0.5 %; 40 mg methylPREDNISolone acetate 40 MG/ML Outcome: tolerated well, no immediate complications Patient was prepped and draped in the usual sterile fashion.    Hand/UE Inj: R thumb CMC for osteoarthritis on 01/04/2023 10:10 AM Indications: pain Details: 25 G needle Medications: 0.3 mL lidocaine 1 %; 0.3 mg methylPREDNISolone acetate 40 MG/ML; 0.3 mL bupivacaine 0.5 % Outcome: tolerated well, no immediate complications Patient was prepped and draped in the usual sterile fashion.       Clinical Data: No additional findings.   Subjective: Chief Complaint  Patient presents with   Left Wrist - Pain   Right Wrist - Pain    HPI Denise [name] is a 62 year old female who  comes in for evaluation of bilateral hand carpal tunnel syndrome and bilateral thumb CMC arthritis.  This is worse on the right hand.  She has had pain for years.  She works on a Animator and types all day.  Has numbness and tingling that shoots up her arm.  PCP referred her here for suspicion of carpal tunnel syndrome. Review of Systems  Constitutional: Negative.   HENT: Negative.    Eyes: Negative.   Respiratory: Negative.    Cardiovascular: Negative.   Endocrine: Negative.   Musculoskeletal: Negative.   Neurological: Negative.   Hematological: Negative.   Psychiatric/Behavioral: Negative.    All other systems reviewed and are negative.    Objective: Vital Signs: There were no vitals taken for this visit.  Physical Exam Vitals and nursing note reviewed.  Constitutional:      Appearance: She is well-developed.  HENT:     Head: Atraumatic.     Nose: Nose normal.  Eyes:     Extraocular Movements: Extraocular movements intact.  Cardiovascular:     Pulses: Normal pulses.  Pulmonary:     Effort: Pulmonary effort is normal.  Abdominal:     Palpations: Abdomen is soft.  Musculoskeletal:     Cervical back: Neck supple.  Skin:    General: Skin is warm.     Capillary Refill: Capillary refill  takes less than 2 seconds.  Neurological:     Mental Status: She is alert. Mental status is at baseline.  Psychiatric:        Behavior: Behavior normal.        Thought Content: Thought content normal.        Judgment: Judgment normal.     Ortho Exam Examination of bilateral hands show positive carpal tunnel compressive signs on the right hand.  No muscle atrophy.  She has pain and crepitus with CMC grind test of the right hand. Specialty Comments:  No specialty comments available.  Imaging: No results found.   PMFS History: Patient Active Problem List   Diagnosis Date Noted   Primary osteoarthritis of first carpometacarpal joint of right hand 01/04/2023   Primary osteoarthritis  of first carpometacarpal joint of left hand 01/04/2023   Right carpal tunnel syndrome 01/04/2023   Left carpal tunnel syndrome 01/04/2023   CAD (coronary artery disease) 12/27/2022   Encounter for annual general medical examination with abnormal findings in adult 12/27/2022   Chronic pain of both wrists 12/27/2022   Foul smelling urine 12/27/2022   Carotid atherosclerosis, bilateral 12/27/2022   Hyperlipidemia 12/27/2021   Iron deficiency anemia due to chronic blood loss 12/27/2021   Adnexal mass 09/08/2021   GAD (generalized anxiety disorder) 06/16/2021   Lung nodule 12/30/2019   Sessile colonic polyp 12/30/2019   AVM (arteriovenous malformation) of colon 12/30/2019   Chest pressure 12/22/2019   Murmur 12/22/2019   Lower extremity edema 12/22/2019   Exertional dyspnea 12/22/2019   Symptomatic anemia 12/22/2019   Cardiomegaly 12/22/2019   Tobacco abuse 12/22/2019   Hyponatremia 12/22/2019   COPD (chronic obstructive pulmonary disease) (HCC) 12/22/2019   Former smoker 08/31/2019   Hypertension 03/31/2012   Past Medical History:  Diagnosis Date   Allergy    BMI 35.0-35.9,adult 03/31/2012   Chickenpox    Cholecystitis with cholelithiasis 03/31/2012   Heart murmur    Hypertension 03/31/2012   Shingles     Family History  Problem Relation Age of Onset   Arthritis Mother    Cervical cancer Mother    Heart disease Father    Hypertension Father     Past Surgical History:  Procedure Laterality Date   BIOPSY  12/24/2019   Procedure: BIOPSY;  Surgeon: Rachael Fee, MD;  Location: [facility] ENDOSCOPY;  Service: Endoscopy;;   CHOLECYSTECTOMY  03/30/2012   Procedure: LAPAROSCOPIC CHOLECYSTECTOMY WITH INTRAOPERATIVE CHOLANGIOGRAM;  Surgeon: Mariella Saa, MD;  Location: [facility] ORS;  Service: General;  Laterality: N/A;   COLONOSCOPY WITH PROPOFOL N/A 12/24/2019   Procedure: COLONOSCOPY WITH PROPOFOL;  Surgeon: Rachael Fee, MD;  Location: [facility] ENDOSCOPY;  Service: Endoscopy;   Laterality: N/A;   ESOPHAGOGASTRODUODENOSCOPY (EGD) WITH PROPOFOL N/A 12/24/2019   Procedure: ESOPHAGOGASTRODUODENOSCOPY (EGD) WITH PROPOFOL;  Surgeon: Rachael Fee, MD;  Location: [facility] ENDOSCOPY;  Service: Endoscopy;  Laterality: N/A;   HOT HEMOSTASIS N/A 12/24/2019   Procedure: HOT HEMOSTASIS (ARGON PLASMA COAGULATION/BICAP);  Surgeon: Rachael Fee, MD;  Location: [facility] ENDOSCOPY;  Service: Endoscopy;  Laterality: N/A;   POLYPECTOMY  12/24/2019   Procedure: POLYPECTOMY;  Surgeon: Rachael Fee, MD;  Location: [facility] ENDOSCOPY;  Service: Endoscopy;;   TUBAL LIGATION     Social History   Occupational History   Not on file  Tobacco Use   Smoking status: Former    Packs/day: 0.75    Years: 34.00    Additional pack years: 0.00    Total pack years: 25.50  Types: Cigarettes    Quit date: 05/07/2019    Years since quitting: 3.6   Smokeless tobacco: Never  Vaping Use   Vaping Use: Never used  Substance and Sexual Activity   Alcohol use: No   Drug use: No   Sexual activity: Never    Birth control/protection: None, Post-menopausal

## 2023-01-11 ENCOUNTER — Other Ambulatory Visit: Payer: Self-pay | Admitting: Primary Care

## 2023-01-11 DIAGNOSIS — J449 Chronic obstructive pulmonary disease, unspecified: Secondary | ICD-10-CM

## 2023-01-15 ENCOUNTER — Other Ambulatory Visit: Payer: Self-pay | Admitting: Primary Care

## 2023-01-15 DIAGNOSIS — E785 Hyperlipidemia, unspecified: Secondary | ICD-10-CM

## 2023-02-06 ENCOUNTER — Other Ambulatory Visit: Payer: Self-pay | Admitting: Primary Care

## 2023-02-06 DIAGNOSIS — R911 Solitary pulmonary nodule: Secondary | ICD-10-CM

## 2023-02-13 ENCOUNTER — Ambulatory Visit
Admission: RE | Admit: 2023-02-13 | Discharge: 2023-02-13 | Disposition: A | Discharge status: Home / Self Care | Payer: Managed Care, Other (non HMO) | Source: Ambulatory Visit | Attending: Primary Care | Admitting: Primary Care

## 2023-02-13 DIAGNOSIS — Z1231 Encounter for screening mammogram for malignant neoplasm of breast: Secondary | ICD-10-CM | POA: Diagnosis present

## 2023-02-14 ENCOUNTER — Ambulatory Visit (HOSPITAL_COMMUNITY): Payer: Managed Care, Other (non HMO)

## 2023-02-14 ENCOUNTER — Encounter: Payer: Self-pay | Admitting: *Deleted

## 2023-02-19 ENCOUNTER — Encounter: Payer: Self-pay | Admitting: Primary Care

## 2023-02-19 ENCOUNTER — Ambulatory Visit (INDEPENDENT_AMBULATORY_CARE_PROVIDER_SITE_OTHER): Payer: Managed Care, Other (non HMO) | Admitting: Primary Care

## 2023-02-19 VITALS — BP 136/82 | HR 106 | Temp 98.2°F | Ht 64.0 in | Wt 200.0 lb

## 2023-02-19 DIAGNOSIS — J3489 Other specified disorders of nose and nasal sinuses: Secondary | ICD-10-CM | POA: Diagnosis not present

## 2023-02-19 MED ORDER — AMOXICILLIN-POT CLAVULANATE 875-125 MG PO TABS
1.0000 | ORAL_TABLET | Freq: Two times a day (BID) | ORAL | 0 refills | Status: DC
Start: 2023-02-19 — End: 2023-04-16

## 2023-02-19 NOTE — Assessment & Plan Note (Signed)
Exam and HPI today consistent for sinusitis. We did discuss to schedule another eye exam to rule out glaucoma and other eye pressure disorders. She agrees.  Start Augmentin antibiotics. Take 1 tablet by mouth twice daily for 7 days.  Discussed use of Flonase. Discussed that we may need to obtain a sinus CT scan in the near future, especially if no improvement with treatment.   She will update.

## 2023-02-19 NOTE — Progress Notes (Signed)
Subjective:    Patient ID: Denise [name], female    DOB: 01/26/1961, 62 y.o.   MRN: [id number]  Eye Pain  Pertinent negatives include no fever.    Denise [name] is a very pleasant 62 y.o. female with a history of cardiomegaly, hypertension, AVM of colon, CAD, COPD, tobacco abuse, GAD, iron deficiency anemia who presents today to discuss eye pain and sinus pressure.  Chronic blurred vision since late 2023. Chronic and intermittent pain deep behind her right upper eye for years. Four days ago she noticed increased pain and pressure, increased blurred vision. Symptoms are worse with bending forward. Symptoms feel like a sinus infection.   History of sinusitis, last treated in February 2024 at Urgent Care, treated with Augmentin. She felt better after this but doesn't feel that the infection ever resolved.   She took 1300 mg of Tylenol twice daily yesterday with some improvement but without resolve. She's also tried Saline nasal sprays, steam, and applying cold compresses. Today she's feeling much better. She's never undergone a sinus CT.   Her last eye exam was in August 2023, tested negative for glaucoma. She is due for another eye exam and will schedule.    Review of Systems  Constitutional:  Negative for chills and fever.  HENT:  Positive for postnasal drip, rhinorrhea, sinus pressure and sinus pain. Negative for congestion and sore throat.   Eyes:  Positive for pain.  Respiratory:  Positive for cough.          Past Medical History:  Diagnosis Date   Allergy    BMI 35.0-35.9,adult 03/31/2012   Chickenpox    Cholecystitis with cholelithiasis 03/31/2012   Heart murmur    Hypertension 03/31/2012   Shingles     Social History   Socioeconomic History   Marital status: Widowed    Spouse name: Not on file   Number of children: Not on file   Years of education: Not on file   Highest education level: Not on file  Occupational History   Not on file  Tobacco Use    Smoking status: Former    Current packs/day: 0.00    Average packs/day: 0.8 packs/day for 34.0 years (25.5 ttl pk-yrs)    Types: Cigarettes    Start date: 05/06/1985    Quit date: 05/07/2019    Years since quitting: 3.7   Smokeless tobacco: Never  Vaping Use   Vaping status: Never Used  Substance and Sexual Activity   Alcohol use: No   Drug use: No   Sexual activity: Never    Birth control/protection: None, Post-menopausal  Other Topics Concern   Not on file  Social History Narrative   Widow.    2 children.   Works in Clinical biochemist.   Enjoys reading, cooking.    Social Determinants of Health   Financial Resource Strain: Not on file  Food Insecurity: Not on file  Transportation Needs: Not on file  Physical Activity: Not on file  Stress: Not on file  Social Connections: Not on file  Intimate Partner Violence: Not on file    Past Surgical History:  Procedure Laterality Date   BIOPSY  12/24/2019   Procedure: BIOPSY;  Surgeon: Rachael Fee, MD;  Location: [facility] ENDOSCOPY;  Service: Endoscopy;;   CHOLECYSTECTOMY  03/30/2012   Procedure: LAPAROSCOPIC CHOLECYSTECTOMY WITH INTRAOPERATIVE CHOLANGIOGRAM;  Surgeon: Mariella Saa, MD;  Location: [facility] ORS;  Service: General;  Laterality: N/A;   COLONOSCOPY WITH PROPOFOL N/A 12/24/2019   Procedure:  COLONOSCOPY WITH PROPOFOL;  Surgeon: Rachael Fee, MD;  Location: [facility] ENDOSCOPY;  Service: Endoscopy;  Laterality: N/A;   ESOPHAGOGASTRODUODENOSCOPY (EGD) WITH PROPOFOL N/A 12/24/2019   Procedure: ESOPHAGOGASTRODUODENOSCOPY (EGD) WITH PROPOFOL;  Surgeon: Rachael Fee, MD;  Location: [facility] ENDOSCOPY;  Service: Endoscopy;  Laterality: N/A;   HOT HEMOSTASIS N/A 12/24/2019   Procedure: HOT HEMOSTASIS (ARGON PLASMA COAGULATION/BICAP);  Surgeon: Rachael Fee, MD;  Location: [facility] ENDOSCOPY;  Service: Endoscopy;  Laterality: N/A;   POLYPECTOMY  12/24/2019   Procedure: POLYPECTOMY;  Surgeon: Rachael Fee, MD;  Location: [facility] ENDOSCOPY;   Service: Endoscopy;;   TUBAL LIGATION      Family History  Problem Relation Age of Onset   Arthritis Mother    Cervical cancer Mother    Heart disease Father    Hypertension Father     Allergies  Allergen Reactions   Codeine Itching    Current Outpatient Medications on File Prior to Visit  Medication Sig Dispense Refill   albuterol (VENTOLIN HFA) 108 (90 Base) MCG/ACT inhaler INHALE 2 PUFFS INTO THE LUNGS EVERY 4-6 HOURS AS NEEDED FOR TIGHTNESS/WHEEZING 8.5 each 0   amLODipine (NORVASC) 10 MG tablet TAKE 1 TABLET BY MOUTH EVERY DAY FOR BLOOD PRESSURE 90 tablet 0   Ascorbic Acid (VITAMIN C) 1000 MG tablet Take 1,000 mg by mouth daily.     aspirin EC 81 MG tablet Take 81 mg by mouth daily.     atorvastatin (LIPITOR) 80 MG tablet Take 1 tablet (80 mg total) by mouth daily. for cholesterol. 90 tablet 3   famotidine (PEPCID) 20 MG tablet Take 20 mg by mouth daily as needed for heartburn or indigestion.     ferrous gluconate (FERGON) 324 MG tablet TAKE 1 TABLET BY MOUTH DAILY WITH BREAKFAST 90 tablet 3   lisinopril (ZESTRIL) 30 MG tablet TAKE 1 TABLET (30 MG TOTAL) BY MOUTH DAILY. FOR BLOOD PRESSURE. 90 tablet 0   pyridOXINE (VITAMIN B-6) 25 MG tablet Take 25 mg by mouth daily.     sertraline (ZOLOFT) 100 MG tablet TAKE 1 TABLET (100 MG TOTAL) BY MOUTH DAILY. FOR ANXIETY 90 tablet 0   vitamin B-12 (CYANOCOBALAMIN) 1000 MCG tablet Take 1,000 mcg by mouth daily.     vitamin E 180 MG (400 UNITS) capsule Take 800 Units by mouth daily.     WIXELA INHUB 250-50 MCG/ACT AEPB INHALE 1 PUFF INTO THE LUNGS IN THE MORNING AND AT BEDTIME. 60 each 5   No current facility-administered medications on file prior to visit.    BP 136/82   Pulse (!) 106   Temp 98.2 F (36.8 C) (Temporal)   Ht 5\' 4"  (1.626 m)   Wt 200 lb (90.7 kg)   SpO2 95%   BMI 34.33 kg/m  Objective:   Physical Exam HENT:     Right Ear: Tympanic membrane and ear canal normal.     Left Ear: Tympanic membrane and ear canal  normal.     Nose:     Right Sinus: Frontal sinus tenderness present. No maxillary sinus tenderness.     Left Sinus: No maxillary sinus tenderness or frontal sinus tenderness.     Mouth/Throat:     Pharynx: No posterior oropharyngeal erythema.  Eyes:     General:        Right eye: No discharge.        Left eye: No discharge.     Conjunctiva/sclera: Conjunctivae normal.     Right eye: Right conjunctiva is not injected.  No exudate.    Left eye: Left conjunctiva is not injected. No exudate.    Comments: Mild soft tissue swelling to right upper eye.  Cardiovascular:     Rate and Rhythm: Normal rate and regular rhythm.  Pulmonary:     Effort: Pulmonary effort is normal.     Breath sounds: Normal breath sounds. No wheezing or rales.  Musculoskeletal:     Cervical back: Neck supple.  Lymphadenopathy:     Cervical: No cervical adenopathy.  Skin:    General: Skin is warm and dry.           Assessment & Plan:  Sinus pressure Assessment & Plan: Exam and HPI today consistent for sinusitis. We did discuss to schedule another eye exam to rule out glaucoma and other eye pressure disorders. She agrees.  Start Augmentin antibiotics. Take 1 tablet by mouth twice daily for 7 days.  Discussed use of Flonase. Discussed that we may need to obtain a sinus CT scan in the near future, especially if no improvement with treatment.   She will update.   Orders: -     Amoxicillin-Pot Clavulanate; Take 1 tablet by mouth 2 (two) times daily.  Dispense: 14 tablet; Refill: 0        Doreene Nest, NP

## 2023-02-19 NOTE — Patient Instructions (Signed)
Start Augmentin antibiotics. Take 1 tablet by mouth twice daily for 7 days.  Schedule your eye exam.  Try using Flonase (fluticasone) nasal spray. Instill 1 spray in each nostril twice daily.   It was a pleasure to see you today!

## 2023-02-21 ENCOUNTER — Telehealth: Payer: Self-pay | Admitting: Primary Care

## 2023-02-21 ENCOUNTER — Other Ambulatory Visit: Payer: Self-pay

## 2023-02-21 ENCOUNTER — Emergency Department (HOSPITAL_COMMUNITY)
Admission: EM | Admit: 2023-02-21 | Discharge: 2023-02-21 | Disposition: A | Discharge status: Home / Self Care | Payer: Managed Care, Other (non HMO) | Attending: Emergency Medicine | Admitting: Emergency Medicine

## 2023-02-21 ENCOUNTER — Emergency Department (HOSPITAL_COMMUNITY): Payer: Managed Care, Other (non HMO)

## 2023-02-21 DIAGNOSIS — I10 Essential (primary) hypertension: Secondary | ICD-10-CM | POA: Diagnosis not present

## 2023-02-21 DIAGNOSIS — H538 Other visual disturbances: Secondary | ICD-10-CM | POA: Diagnosis not present

## 2023-02-21 DIAGNOSIS — J449 Chronic obstructive pulmonary disease, unspecified: Secondary | ICD-10-CM | POA: Insufficient documentation

## 2023-02-21 DIAGNOSIS — Z79899 Other long term (current) drug therapy: Secondary | ICD-10-CM | POA: Diagnosis not present

## 2023-02-21 DIAGNOSIS — H5711 Ocular pain, right eye: Secondary | ICD-10-CM | POA: Diagnosis present

## 2023-02-21 DIAGNOSIS — Z7982 Long term (current) use of aspirin: Secondary | ICD-10-CM | POA: Insufficient documentation

## 2023-02-21 LAB — CBC WITH DIFFERENTIAL/PLATELET
Abs Immature Granulocytes: 0.05 10*3/uL (ref 0.00–0.07)
Basophils Absolute: 0.1 10*3/uL (ref 0.0–0.1)
Basophils Relative: 1 %
Eosinophils Absolute: 0.1 10*3/uL (ref 0.0–0.5)
Eosinophils Relative: 1 %
HCT: 45.7 % (ref 36.0–46.0)
Hemoglobin: 14.5 g/dL (ref 12.0–15.0)
Immature Granulocytes: 0 %
Lymphocytes Relative: 15 %
Lymphs Abs: 1.8 10*3/uL (ref 0.7–4.0)
MCH: 29.2 pg (ref 26.0–34.0)
MCHC: 31.7 g/dL (ref 30.0–36.0)
MCV: 92.1 fL (ref 80.0–100.0)
Monocytes Absolute: 0.8 10*3/uL (ref 0.1–1.0)
Monocytes Relative: 6 %
Neutro Abs: 9.3 10*3/uL — ABNORMAL HIGH (ref 1.7–7.7)
Neutrophils Relative %: 77 %
Platelets: 385 10*3/uL (ref 150–400)
RBC: 4.96 MIL/uL (ref 3.87–5.11)
RDW: 13.8 % (ref 11.5–15.5)
WBC: 12 10*3/uL — ABNORMAL HIGH (ref 4.0–10.5)
nRBC: 0 % (ref 0.0–0.2)

## 2023-02-21 LAB — COMPREHENSIVE METABOLIC PANEL
ALT: 24 U/L (ref 0–44)
AST: 29 U/L (ref 15–41)
Albumin: 3.9 g/dL (ref 3.5–5.0)
Alkaline Phosphatase: 91 U/L (ref 38–126)
Anion gap: 10 (ref 5–15)
BUN: 6 mg/dL — ABNORMAL LOW (ref 8–23)
CO2: 22 mmol/L (ref 22–32)
Calcium: 9.2 mg/dL (ref 8.9–10.3)
Chloride: 102 mmol/L (ref 98–111)
Creatinine, Ser: 0.75 mg/dL (ref 0.44–1.00)
GFR, Estimated: >60 mL/min (ref >60)
Glucose, Bld: 124 mg/dL — ABNORMAL HIGH (ref 70–99)
Potassium: 4.1 mmol/L (ref 3.5–5.1)
Sodium: 134 mmol/L — ABNORMAL LOW (ref 135–145)
Total Bilirubin: 0.7 mg/dL (ref 0.3–1.2)
Total Protein: 7.2 g/dL (ref 6.5–8.1)

## 2023-02-21 LAB — SEDIMENTATION RATE: Sed Rate: 7 mm/hr (ref 0–22)

## 2023-02-21 LAB — C-REACTIVE PROTEIN: CRP: 0.5 mg/dL (ref <1.0)

## 2023-02-21 MED ORDER — FLUORESCEIN SODIUM 1 MG OP STRP
1.0000 | ORAL_STRIP | Freq: Once | OPHTHALMIC | Status: AC
  Administered 2023-02-21: 1 via OPHTHALMIC
  Filled 2023-02-21: qty 1

## 2023-02-21 MED ORDER — TETRACAINE HCL 0.5 % OP SOLN
2.0000 [drp] | Freq: Once | OPHTHALMIC | Status: AC
  Administered 2023-02-21: 2 [drp] via OPHTHALMIC
  Filled 2023-02-21: qty 4

## 2023-02-21 MED ORDER — GADOBUTROL 1 MMOL/ML IV SOLN
9.0000 mL | Freq: Once | INTRAVENOUS | Status: AC | PRN
  Administered 2023-02-21: 9 mL via INTRAVENOUS

## 2023-02-21 NOTE — ED Provider Notes (Signed)
Blood pressure (!) 142/80, pulse 90, temperature 98.2 F (36.8 C), resp. rate 17, height 5\' 3"  (1.6 m), weight 90.7 kg, SpO2 96%.  Assuming care from Dr. Rush Landmark.  In short, Denise [name] is a 62 y.o. female with a chief complaint of Eye Problem and Blurred Vision .  Refer to the original H&P for additional details.  The current plan of care is to follow up on MRI brain.  08:42 PM  Called to patient bedside by nursing.  She has returned from MRI but the results are not in from radiology.  She tells me that she is tired of being here in the emergency department and would like to leave.  She understands that we do not have results to discuss and could be missing a potentially serious diagnosis.  She appears to have capacity to make this decision and has decided to leave AGAINST MEDICAL ADVICE.    Maia Plan, MD 02/25/23 [phone]

## 2023-02-21 NOTE — ED Triage Notes (Signed)
Pt. Stated, Ive had blurred vision and eye pain  . The blurred vision for about a year and sensitivity to light. Ive eye pain since Sunday. I went to  [facility] in [place]  and was given an antibiotic and then the pain came back this morning along with the blurry viision

## 2023-02-21 NOTE — Telephone Encounter (Signed)
Patient called in to let Jae Dire know that she is gonna go over to the ED to get her eye checked.She said that it has gotten worse,more swollen,and red and irritated, after her visit on yesterday with Jae Dire and she's gonna go see if they can do something different for her.

## 2023-02-21 NOTE — Discharge Instructions (Signed)
You were seen in the emergency room today with vision changes.  We have performed MRIs but the results are not back.  You are choosing to leave AGAINST MEDICAL ADVICE.  If you change your mind you can always return for reevaluation.  I have listed the name of an ophthalmologist on this form for follow-up.  Please call the office first thing in the morning.  Return to the emergency department with any new or suddenly worsening symptoms.

## 2023-02-21 NOTE — ED Provider Notes (Signed)
Denise [name] Provider Note   CSN: [id number] Arrival date & time: 02/21/23  1017     History  Chief Complaint  Patient presents with   Eye Problem   Blurred Vision    Denise [name] is a 62 y.o. female.  The history is provided by the patient.  Eye Problem Location:  Right eye Quality:  Dull Severity:  Unable to specify Onset quality:  Unable to specify Duration:  1 week Timing:  Constant Progression:  Worsening Chronicity:  New Relieved by:  Nothing Worsened by:  Nothing Ineffective treatments:  None tried Associated symptoms: blurred vision, decreased vision, headaches and photophobia   Associated symptoms: no discharge, no double vision, no inflammation, no itching, no nausea, no numbness, no tingling, no vomiting and no weakness        Home Medications Prior to Admission medications   Medication Sig Start Date End Date Taking? Authorizing Provider  albuterol (VENTOLIN HFA) 108 (90 Base) MCG/ACT inhaler INHALE 2 PUFFS INTO THE LUNGS EVERY 4-6 HOURS AS NEEDED FOR TIGHTNESS/WHEEZING 02/26/22   Doreene Nest, NP  amLODipine (NORVASC) 10 MG tablet TAKE 1 TABLET BY MOUTH EVERY DAY FOR BLOOD PRESSURE 12/16/22   Doreene Nest, NP  amoxicillin-clavulanate (AUGMENTIN) 875-125 MG tablet Take 1 tablet by mouth 2 (two) times daily. 02/19/23   Doreene Nest, NP  Ascorbic Acid (VITAMIN C) 1000 MG tablet Take 1,000 mg by mouth daily.    [provider]  aspirin EC 81 MG tablet Take 81 mg by mouth daily.    [provider]  atorvastatin (LIPITOR) 80 MG tablet Take 1 tablet (80 mg total) by mouth daily. for cholesterol. 01/03/23 01/03/24  Doreene Nest, NP  famotidine (PEPCID) 20 MG tablet Take 20 mg by mouth daily as needed for heartburn or indigestion.    [provider]  ferrous gluconate (FERGON) 324 MG tablet TAKE 1 TABLET BY MOUTH DAILY WITH BREAKFAST 12/28/21   Doreene Nest, NP   lisinopril (ZESTRIL) 30 MG tablet TAKE 1 TABLET (30 MG TOTAL) BY MOUTH DAILY. FOR BLOOD PRESSURE. 12/16/22   Doreene Nest, NP  pyridOXINE (VITAMIN B-6) 25 MG tablet Take 25 mg by mouth daily.    [provider]  sertraline (ZOLOFT) 100 MG tablet TAKE 1 TABLET (100 MG TOTAL) BY MOUTH DAILY. FOR ANXIETY 10/18/22   Doreene Nest, NP  vitamin B-12 (CYANOCOBALAMIN) 1000 MCG tablet Take 1,000 mcg by mouth daily.    [provider]  vitamin E 180 MG (400 UNITS) capsule Take 800 Units by mouth daily.    [provider]  Robbie Louis 250-50 MCG/ACT AEPB INHALE 1 PUFF INTO THE LUNGS IN THE MORNING AND AT BEDTIME. 01/11/23   Doreene Nest, NP      Allergies    Codeine    Review of Systems   Review of Systems  Constitutional:  Negative for chills, fatigue and fever.  HENT:  Negative for congestion.   Eyes:  Positive for blurred vision, photophobia, pain and visual disturbance. Negative for double vision, discharge and itching.  Respiratory:  Negative for chest tightness, shortness of breath and wheezing.   Cardiovascular:  Negative for palpitations.  Gastrointestinal:  Negative for abdominal pain, constipation, diarrhea, nausea and vomiting.  Genitourinary:  Negative for flank pain.  Musculoskeletal:  Negative for back pain, neck pain and neck stiffness.  Skin:  Negative for rash and wound.  Neurological:  Positive for headaches. Negative  for tingling, weakness, light-headedness and numbness.  Psychiatric/Behavioral:  Negative for agitation and confusion.   All other systems reviewed and are negative.   Physical Exam Updated Vital Signs BP (!) 142/80   Pulse 90   Temp 98.2 F (36.8 C)   Resp 17   Ht 5\' 3"  (1.6 m)   Wt 90.7 kg   SpO2 96%   BMI 35.43 kg/m  Physical Exam Vitals and nursing note reviewed.  Constitutional:      General: She is not in acute distress.    Appearance: She is well-developed. She is not ill-appearing, toxic-appearing or  diaphoretic.  HENT:     Head: Normocephalic and atraumatic.     Nose: No congestion or rhinorrhea.     Mouth/Throat:     Pharynx: No oropharyngeal exudate or posterior oropharyngeal erythema.  Eyes:     General: Lids are normal.     Intraocular pressure: Right eye pressure is 23 mmHg. Measurements were taken using a handheld tonometer.    Extraocular Movements: Extraocular movements intact.     Right eye: Normal extraocular motion and no nystagmus.     Left eye: Normal extraocular motion and no nystagmus.     Conjunctiva/sclera:     Right eye: Right conjunctiva is injected.     Left eye: Left conjunctiva is not injected.  Neck:     Vascular: No carotid bruit.  Cardiovascular:     Rate and Rhythm: Normal rate and regular rhythm.     Pulses: Normal pulses.     Heart sounds: No murmur heard. Pulmonary:     Effort: Pulmonary effort is normal. No respiratory distress.     Breath sounds: Normal breath sounds. No wheezing, rhonchi or rales.  Chest:     Chest wall: No tenderness.  Abdominal:     General: Abdomen is flat.     Palpations: Abdomen is soft.     Tenderness: There is no abdominal tenderness. There is no right CVA tenderness, left CVA tenderness, guarding or rebound.  Musculoskeletal:        General: No swelling or tenderness.     Cervical back: Neck supple. No tenderness.     Right lower leg: No edema.     Left lower leg: No edema.  Skin:    General: Skin is warm and dry.     Capillary Refill: Capillary refill takes less than 2 seconds.     Findings: No erythema.  Neurological:     Mental Status: She is alert.     Sensory: No sensory deficit.     Motor: No weakness.  Psychiatric:        Mood and Affect: Mood normal.     ED Results / Procedures / Treatments   Labs (all labs ordered are listed, but only abnormal results are displayed) Labs Reviewed  CBC WITH DIFFERENTIAL/PLATELET - Abnormal; Notable for the following components:      Result Value   WBC 12.0 (*)     Neutro Abs 9.3 (*)    All other components within normal limits  COMPREHENSIVE METABOLIC PANEL - Abnormal; Notable for the following components:   Sodium 134 (*)    Glucose, Bld 124 (*)    BUN 6 (*)    All other components within normal limits  C-REACTIVE PROTEIN  SEDIMENTATION RATE    EKG None  Radiology No results found.  Procedures Procedures    Medications Ordered in ED Medications  fluorescein ophthalmic strip 1 strip (1 strip Right Eye  Given 02/21/23 1417)  tetracaine (PONTOCAINE) 0.5 % ophthalmic solution 2 drop (2 drops Right Eye Given 02/21/23 1417)    ED Course/ Medical Decision Making/ A&P                             Medical Decision Making Amount and/or Complexity of Data Reviewed Labs: ordered. Radiology: ordered.  Risk Prescription drug management.    Denise [name] is a 62 y.o. female with a past medical history significant for hypertension, COPD, hyperlipidemia, previous shingles, and previous cholecystectomy who presents for eye pain and vision changes.  According to patient, for the last several months she has had some mild discomfort in her right eye and behind her right eye.  She reports headaches.  She reports that over the last week it is worsened.  She noticed it worsened today even further and she noticed her right eye was slightly dilated.  She reports her conjunctiva also look slightly injected.  She denies any trauma.  Denies history of MS strokes or tumors but is never had any head imaging.  She was started antibiotic several days ago for suspected sinusitis given the pain she was having around her eye and sinuses.  She reports that has not helped.  She reports no speech difficulties nausea vomiting or other neurologic complaints.  She reports some carpal tunnel tingling in her right hand that was chronic.  She denies any posterior headache or neck pain.  She said that she was screened for glaucoma in the past and was negative.  On my exam,  lungs clear.  Chest nontender.  Abdomen nontender.  Patient moving all extremities with intact sensation and strength.  Good pulses.  No carotid bruit.  Patient did have some tenderness about her right eye but no focal erythema seen.  She did have some injected conjunctiva and her pupil was slightly dilated and sluggish.  A fluorescein eye exam was performed and she did not have evidence of uptake and no evidence of corneal abrasion.  Tono-Pen was utilized and was found to be 23, just elevated.   I initially discussed with neurology and due to the injection, dilation, and reported blurry vision with pain, he agreed with getting MRI brain with and without contrast and MRI orbits with and without as well as MRV to look for a sinus thrombus as well.  Will consult ophthalmology given the concern for possible glaucoma but will wait for head imaging.  Spoke to ophthalmology who wants to see her in clinic.  They agreed with getting the imaging as she likely not be symptomatic from the pressure of only 23.  They would like seen in clinic either today or tomorrow and they will call her to help set this up.  She will get the MRIs for further evaluation.  Care transferred to Dr. Jacqulyn Bath to await for results of MRIs.  Anticipate discharge if MRI is reassuring.         Final Clinical Impression(s) / ED Diagnoses Final diagnoses:  Acute right eye pain  Blurred vision     Clinical Impression: 1. Acute right eye pain   2. Blurred vision     Disposition: Admit  This note was prepared with assistance of Dragon voice recognition software. Occasional wrong-word or sound-a-like substitutions may have occurred due to the inherent limitations of voice recognition software.     Kourtnee Lahey, Canary Brim, MD 02/21/23 1520

## 2023-02-21 NOTE — ED Notes (Signed)
Patient states "Take this IV out now. I am ready to leave. I not waiting any longer." Patient A&Ox4 without confusion. MD explained all risk and benefits, patient and family understood by verbalizing understanding.

## 2023-02-22 ENCOUNTER — Ambulatory Visit (HOSPITAL_COMMUNITY): Payer: Managed Care, Other (non HMO)

## 2023-02-22 NOTE — Telephone Encounter (Signed)
Called patient reviewed all information and repeated back to me. Will call if any questions.  She states she does not have appointment set up but will call [facility] to do that.

## 2023-02-22 NOTE — Telephone Encounter (Signed)
Noted  

## 2023-02-22 NOTE — Telephone Encounter (Signed)
Reviewed ED notes and imaging.  Please notify patient that all MRIs were negative for a mass or any problems within the brain. As discussed during her visit, she needs to see her eye doctor. Looks like the ED was trying to get her set up. Does she have an appointment?

## 2023-03-14 ENCOUNTER — Other Ambulatory Visit: Payer: Self-pay | Admitting: Primary Care

## 2023-03-14 DIAGNOSIS — I10 Essential (primary) hypertension: Secondary | ICD-10-CM

## 2023-03-18 ENCOUNTER — Other Ambulatory Visit: Payer: Self-pay | Admitting: Primary Care

## 2023-03-18 DIAGNOSIS — I10 Essential (primary) hypertension: Secondary | ICD-10-CM

## 2023-03-25 NOTE — Telephone Encounter (Signed)
Do you need to see patient in office?  

## 2023-04-02 LAB — OPHTHALMOLOGY REPORT-SCANNED

## 2023-04-09 LAB — HM DIABETES EYE EXAM

## 2023-04-16 ENCOUNTER — Ambulatory Visit (INDEPENDENT_AMBULATORY_CARE_PROVIDER_SITE_OTHER): Payer: Managed Care, Other (non HMO) | Admitting: Primary Care

## 2023-04-16 ENCOUNTER — Encounter: Payer: Self-pay | Admitting: *Deleted

## 2023-04-16 ENCOUNTER — Encounter: Payer: Self-pay | Admitting: Primary Care

## 2023-04-16 VITALS — BP 136/78 | HR 100 | Temp 97.3°F | Ht 63.0 in | Wt 195.0 lb

## 2023-04-16 DIAGNOSIS — F32A Depression, unspecified: Secondary | ICD-10-CM

## 2023-04-16 DIAGNOSIS — Z23 Encounter for immunization: Secondary | ICD-10-CM | POA: Diagnosis not present

## 2023-04-16 DIAGNOSIS — I6523 Occlusion and stenosis of bilateral carotid arteries: Secondary | ICD-10-CM | POA: Diagnosis not present

## 2023-04-16 DIAGNOSIS — E785 Hyperlipidemia, unspecified: Secondary | ICD-10-CM | POA: Diagnosis not present

## 2023-04-16 DIAGNOSIS — I2510 Atherosclerotic heart disease of native coronary artery without angina pectoris: Secondary | ICD-10-CM | POA: Diagnosis not present

## 2023-04-16 DIAGNOSIS — R911 Solitary pulmonary nodule: Secondary | ICD-10-CM

## 2023-04-16 DIAGNOSIS — F419 Anxiety disorder, unspecified: Secondary | ICD-10-CM

## 2023-04-16 LAB — LIPID PANEL
Cholesterol: 120 mg/dL (ref 0–200)
HDL: 45.5 mg/dL (ref >39.00)
LDL Cholesterol: 57 mg/dL (ref 0–99)
NonHDL: 74.01
Total CHOL/HDL Ratio: 3
Triglycerides: 84 mg/dL (ref 0.0–149.0)
VLDL: 16.8 mg/dL (ref 0.0–40.0)

## 2023-04-16 MED ORDER — BUPROPION HCL ER (XL) 150 MG PO TB24
150.0000 mg | ORAL_TABLET | Freq: Every day | ORAL | 0 refills | Status: DC
Start: 2023-04-16 — End: 2023-05-23

## 2023-04-16 NOTE — Assessment & Plan Note (Addendum)
Uncontrolled.  Discussed options for treatment.  Zoloft wasn't very effective. She would like to try Wellbutrin which was effective previously.  Start Wellbutrin XL 150 mg daily.  Agree to provide FMLA for a continuous 30-day leave from work.  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 Effective date of 04/16/2023, tentative return to work date of 05/14/2023.  She will update.

## 2023-04-16 NOTE — Progress Notes (Signed)
Subjective:    Patient ID: Denise [name], female    DOB: 1961-06-24, 62 y.o.   MRN: [id number]  HPI  Denise [name] is a very pleasant 62 y.o. female with a history of hypertension, cardiomegaly, CAD, COPD, lung nodule, symptomatic iron deficiency anemia, hyperlipidemia who presents today to discuss several concerns.  She is also due for Shingrix and influenza vaccines.  She is due for follow-up CT scan of her chest to reevaluate a left lower lobe pulmonary nodule that was noted in CT and PET scan in 2022. The CT scan of her chest was ordered in May and July 2024, she did not complete this yet. She is willing to have this done.   She is also needing a bilateral carotid artery ultrasound for better assessment of carotid atherosclerosis that was an incidental finding from her PET scan in 2022.   She is wanting to take a 30 day leave to handle her medical conditions and testing needed. Her occupation causes a lot of stress for her overall, doesn't feel that she can ask off during the work week to complete the needed testing. She would like the effective date to be 04/16/2023 with a tentative return to work date of 05/14/2023.  She has yet to notify her HR department.  She continues to worry, feeling overwhelmed with work and her health.  She is also feeling down and depressed at times.  She was once managed on Zoloft in the past which was somewhat helpful, but she hasn't taken in quite some time. She was once managed on Wellbutrin for depression years ago after the passing of her husband, this was more effective. She is also working to quit smoking.      04/16/2023    7:37 AM 02/19/2023    8:02 AM 06/16/2021    8:03 AM 06/16/2021    7:38 AM  GAD 7 : Generalized Anxiety Score  Nervous, Anxious, on Edge 3 1 3 3   Control/stop worrying 3 1 3 3   Worry too much - different things 3 1 3 3   Trouble relaxing 2 1 3 3   Restless 2 1 3 3   Easily annoyed or irritable 2 1 3 3   Afraid - awful  might happen 2 0 2 3  Total GAD 7 Score 17 6 20 21   Anxiety Difficulty Somewhat difficult Not difficult at all Somewhat difficult Very difficult    Wt Readings from Last 3 Encounters:  04/16/23 195 lb (88.5 kg)  02/21/23 200 lb (90.7 kg)  02/19/23 200 lb (90.7 kg)      Review of Systems  Eyes:  Positive for visual disturbance.  Respiratory:  Negative for shortness of breath.   Cardiovascular:  Negative for chest pain.  Neurological:  Negative for headaches.  Psychiatric/Behavioral:  The patient is nervous/anxious.          Past Medical History:  Diagnosis Date   Allergy    BMI 35.0-35.9,adult 03/31/2012   Chickenpox    Cholecystitis with cholelithiasis 03/31/2012   Heart murmur    Hypertension 03/31/2012   Shingles     Social History   Socioeconomic History   Marital status: Widowed    Spouse name: Not on file   Number of children: Not on file   Years of education: Not on file   Highest education level: Associate degree: occupational, Scientist, product/process development, or vocational program  Occupational History   Not on file  Tobacco Use   Smoking status: Former    Current packs/day:  0.00    Average packs/day: 0.8 packs/day for 34.0 years (25.5 ttl pk-yrs)    Types: Cigarettes    Start date: 05/06/1985    Quit date: 05/07/2019    Years since quitting: 3.9   Smokeless tobacco: Never  Vaping Use   Vaping status: Never Used  Substance and Sexual Activity   Alcohol use: No   Drug use: No   Sexual activity: Never    Birth control/protection: None, Post-menopausal  Other Topics Concern   Not on file  Social History Narrative   Widow.    2 children.   Works in Clinical biochemist.   Enjoys reading, cooking.    Social Determinants of Health   Financial Resource Strain: Low Risk  (04/15/2023)   Overall Financial Resource Strain (CARDIA)    Difficulty of Paying Living Expenses: Not very hard  Food Insecurity: No Food Insecurity (04/15/2023)   Hunger Vital Sign    Worried About Running  Out of Food in the Last Year: Never true    Ran Out of Food in the Last Year: Never true  Transportation Needs: No Transportation Needs (04/15/2023)   PRAPARE - Administrator, Civil Service (Medical): No    Lack of Transportation (Non-Medical): No  Physical Activity: Insufficiently Active (04/15/2023)   Exercise Vital Sign    Days of Exercise per Week: 3 days    Minutes of Exercise per Session: 30 min  Stress: Stress Concern Present (04/15/2023)   [name] of Occupational Health - Occupational Stress Questionnaire    Feeling of Stress : Very much  Social Connections: Unknown (04/15/2023)   Social Connection and Isolation Panel [NHANES]    Frequency of Communication with Friends and Family: Three times a week    Frequency of Social Gatherings with Friends and Family: Patient declined    Attends Religious Services: Patient declined    Active Member of Clubs or Organizations: No    Attends Engineer, structural: Not on file    Marital Status: Widowed  Intimate Partner Violence: Not on file    Past Surgical History:  Procedure Laterality Date   BIOPSY  12/24/2019   Procedure: BIOPSY;  Surgeon: Rachael Fee, MD;  Location: [facility] ENDOSCOPY;  Service: Endoscopy;;   CHOLECYSTECTOMY  03/30/2012   Procedure: LAPAROSCOPIC CHOLECYSTECTOMY WITH INTRAOPERATIVE CHOLANGIOGRAM;  Surgeon: Mariella Saa, MD;  Location: [facility] ORS;  Service: General;  Laterality: N/A;   COLONOSCOPY WITH PROPOFOL N/A 12/24/2019   Procedure: COLONOSCOPY WITH PROPOFOL;  Surgeon: Rachael Fee, MD;  Location: [facility] ENDOSCOPY;  Service: Endoscopy;  Laterality: N/A;   ESOPHAGOGASTRODUODENOSCOPY (EGD) WITH PROPOFOL N/A 12/24/2019   Procedure: ESOPHAGOGASTRODUODENOSCOPY (EGD) WITH PROPOFOL;  Surgeon: Rachael Fee, MD;  Location: [facility] ENDOSCOPY;  Service: Endoscopy;  Laterality: N/A;   HOT HEMOSTASIS N/A 12/24/2019   Procedure: HOT HEMOSTASIS (ARGON PLASMA COAGULATION/BICAP);  Surgeon: Rachael Fee,  MD;  Location: [facility] ENDOSCOPY;  Service: Endoscopy;  Laterality: N/A;   POLYPECTOMY  12/24/2019   Procedure: POLYPECTOMY;  Surgeon: Rachael Fee, MD;  Location: [facility] ENDOSCOPY;  Service: Endoscopy;;   TUBAL LIGATION      Family History  Problem Relation Age of Onset   Arthritis Mother    Cervical cancer Mother    Heart disease Father    Hypertension Father     Allergies  Allergen Reactions   Codeine Itching    Current Outpatient Medications on File Prior to Visit  Medication Sig Dispense Refill   amLODipine (NORVASC) 10 MG tablet  TAKE 1 TABLET BY MOUTH EVERY DAY FOR BLOOD PRESSURE 90 tablet 2   Ascorbic Acid (VITAMIN C) 1000 MG tablet Take 1,000 mg by mouth daily.     aspirin EC 81 MG tablet Take 81 mg by mouth daily.     atorvastatin (LIPITOR) 80 MG tablet Take 1 tablet (80 mg total) by mouth daily. for cholesterol. 90 tablet 3   famotidine (PEPCID) 20 MG tablet Take 20 mg by mouth daily as needed for heartburn or indigestion.     ferrous gluconate (FERGON) 324 MG tablet TAKE 1 TABLET BY MOUTH DAILY WITH BREAKFAST 90 tablet 3   lisinopril (ZESTRIL) 30 MG tablet TAKE 1 TABLET (30 MG TOTAL) BY MOUTH DAILY. FOR BLOOD PRESSURE. 90 tablet 2   pyridOXINE (VITAMIN B-6) 25 MG tablet Take 25 mg by mouth daily.     vitamin B-12 (CYANOCOBALAMIN) 1000 MCG tablet Take 1,000 mcg by mouth daily.     vitamin E 180 MG (400 UNITS) capsule Take 800 Units by mouth daily.     WIXELA INHUB 250-50 MCG/ACT AEPB INHALE 1 PUFF INTO THE LUNGS IN THE MORNING AND AT BEDTIME. 60 each 5   albuterol (VENTOLIN HFA) 108 (90 Base) MCG/ACT inhaler INHALE 2 PUFFS INTO THE LUNGS EVERY 4-6 HOURS AS NEEDED FOR TIGHTNESS/WHEEZING (Patient not taking: Reported on 04/16/2023) 8.5 each 0   No current facility-administered medications on file prior to visit.    BP 136/78   Pulse 100   Temp (!) 97.3 F (36.3 C) (Temporal)   Ht 5\' 3"  (1.6 m)   Wt 195 lb (88.5 kg)   SpO2 97%   BMI 34.54 kg/m  Objective:   Physical  Exam Neck:     Vascular: Carotid bruit present.  Cardiovascular:     Rate and Rhythm: Normal rate and regular rhythm.  Pulmonary:     Effort: Pulmonary effort is normal.     Breath sounds: Normal breath sounds.  Musculoskeletal:     Cervical back: Neck supple.  Skin:    General: Skin is warm and dry.  Neurological:     Mental Status: She is alert.  Psychiatric:     Comments: Appears anxious today           Assessment & Plan:  Carotid atherosclerosis, bilateral Assessment & Plan: Ordered carotid ultrasound. She agrees.  Await results.  Continue atorvastatin 80 mg daily. Repeat lipid panel pending for LDL goal of less than 50.  Orders: -     US Carotid Bilateral; Future  Anxiety and depression Assessment & Plan: Uncontrolled.  Discussed options for treatment.  Zoloft wasn't very effective. She would like to try Wellbutrin which was effective previously.  Start Wellbutrin XL 150 mg daily.  Agree to provide FMLA for a continuous 30-day leave from work.  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 Effective date of 04/16/2023, tentative return to work date of 05/14/2023.  She will update.  Orders: -     buPROPion HCl ER (XL); Take 1 tablet (150 mg total) by mouth daily. For depression  Dispense: 90 tablet; Refill: 0  Coronary artery disease involving native coronary artery of native heart without angina pectoris Assessment & Plan: Commended her on working to quit smoking.  Discussed Coronary artery CT scan, cash price $100. She agrees.   CT coronary artery scan ordered. This will also capture the chest to re-evaluate for the left lower lobe lung nodule.   Continue atorvastatin 80 mg daily.  Await results.   Orders: -  CT CARDIAC SCORING (SELF PAY ONLY); Future  Lung nodule Assessment & Plan: Will obtain CT coronary artery scan which will also capture lung nodule. Await results.  Orders: -     CT CARDIAC SCORING (SELF PAY ONLY); Future  Hyperlipidemia,  unspecified hyperlipidemia type Assessment & Plan: Repeat lipid panel pending since we increase the dose of her atorvastatin to 80 mg daily. Goal LDL would be less than 50.  Orders: -     Lipid panel        Doreene Nest, NP

## 2023-04-16 NOTE — Assessment & Plan Note (Signed)
Ordered carotid ultrasound. She agrees.  Await results.  Continue atorvastatin 80 mg daily. Repeat lipid panel pending for LDL goal of less than 50.

## 2023-04-16 NOTE — Patient Instructions (Signed)
You will either be contacted via phone regarding your CT scan and carotid artery ultrasound, or you may receive a letter on your MyChart portal from our referral team with instructions for scheduling an appointment. Please let us know if you have not been contacted by anyone within two weeks.  Please contact your HR department to send me FMLA paperwork for your leave.  Start bupropion (Wellbutrin) XL 150 mg once daily in the morning for depression/anxiety.  It was a pleasure to see you today!

## 2023-04-16 NOTE — Assessment & Plan Note (Signed)
Will obtain CT coronary artery scan which will also capture lung nodule. Await results.

## 2023-04-16 NOTE — Assessment & Plan Note (Signed)
Repeat lipid panel pending since we increase the dose of her atorvastatin to 80 mg daily. Goal LDL would be less than 50.

## 2023-04-16 NOTE — Assessment & Plan Note (Addendum)
Commended her on working to quit smoking.  Discussed Coronary artery CT scan, cash price $100. She agrees.   CT coronary artery scan ordered. This will also capture the chest to re-evaluate for the left lower lobe lung nodule.   Continue atorvastatin 80 mg daily.  Await results.

## 2023-04-19 ENCOUNTER — Other Ambulatory Visit: Payer: Self-pay | Admitting: Primary Care

## 2023-04-19 DIAGNOSIS — F32A Depression, unspecified: Secondary | ICD-10-CM

## 2023-04-23 ENCOUNTER — Ambulatory Visit
Admission: RE | Admit: 2023-04-23 | Discharge: 2023-04-23 | Disposition: A | Discharge status: Home / Self Care | Payer: Managed Care, Other (non HMO) | Source: Ambulatory Visit | Attending: Primary Care

## 2023-04-23 DIAGNOSIS — I6523 Occlusion and stenosis of bilateral carotid arteries: Secondary | ICD-10-CM

## 2023-04-26 ENCOUNTER — Other Ambulatory Visit: Payer: Self-pay | Admitting: Primary Care

## 2023-04-26 DIAGNOSIS — J449 Chronic obstructive pulmonary disease, unspecified: Secondary | ICD-10-CM

## 2023-04-29 ENCOUNTER — Ambulatory Visit
Admission: RE | Admit: 2023-04-29 | Discharge: 2023-04-29 | Disposition: A | Discharge status: Home / Self Care | Payer: Managed Care, Other (non HMO) | Source: Ambulatory Visit | Attending: Primary Care | Admitting: Primary Care

## 2023-04-29 DIAGNOSIS — R911 Solitary pulmonary nodule: Secondary | ICD-10-CM | POA: Insufficient documentation

## 2023-04-29 DIAGNOSIS — I2510 Atherosclerotic heart disease of native coronary artery without angina pectoris: Secondary | ICD-10-CM | POA: Insufficient documentation

## 2023-04-30 ENCOUNTER — Other Ambulatory Visit: Payer: Self-pay | Admitting: Primary Care

## 2023-04-30 ENCOUNTER — Telehealth: Payer: Self-pay | Admitting: Primary Care

## 2023-04-30 DIAGNOSIS — I6523 Occlusion and stenosis of bilateral carotid arteries: Secondary | ICD-10-CM

## 2023-04-30 NOTE — Telephone Encounter (Signed)
Advised patient we have not received ppw yet. She will reach out to HR and get them to fax over ppw.

## 2023-04-30 NOTE — Telephone Encounter (Signed)
Pt called asking if Denise [name] received any ppw from her employer regarding the 30 day leave she has from work? Pt states when she saw Chestine Spore on 04/16/23, Chestine Spore took her out of work. Call back # [phone]

## 2023-05-09 ENCOUNTER — Telehealth: Payer: Self-pay | Admitting: Primary Care

## 2023-05-09 NOTE — Telephone Encounter (Signed)
Patient called in and had some concerns regarding a referral and some other issues. She can be reached at [phone]. Thank you!

## 2023-05-09 NOTE — Telephone Encounter (Signed)
Noted  

## 2023-05-09 NOTE — Telephone Encounter (Signed)
Patient called in stating there is something wrong with referral, she has attempted to call and schedule appt and they were unable to schedule her due to missing information.  Referral coordinators, can you please look into this.   Denise [name],  Patient states she will need an additional 30 day extension to be out of work due to appt not being scheduled yet.  Patient states she is not doing well on Wellbutrin, it is not helping her and she would like to see about going back on Zoloft. She mentions she was previously taking this.

## 2023-05-16 DIAGNOSIS — R911 Solitary pulmonary nodule: Secondary | ICD-10-CM

## 2023-05-16 DIAGNOSIS — I2510 Atherosclerotic heart disease of native coronary artery without angina pectoris: Secondary | ICD-10-CM

## 2023-05-16 NOTE — Telephone Encounter (Signed)
Pt called asking if Denise [name] received anything ppw from [employer] or received any calls? Pt states she was told by [facility] that they faxed over ppw for Denise [name] to comp. Pt states the ppw was regarding her being on leave. Call back # [phone]

## 2023-05-16 NOTE — Telephone Encounter (Signed)
Patient called in and said that [employer] did receive the paper work,however they are missing her office notes from 04/16/2023. She would like to know if this information could be sent over to them?

## 2023-05-17 NOTE — Telephone Encounter (Signed)
OV note from requested visit date has been faxed to [employer].

## 2023-05-20 ENCOUNTER — Other Ambulatory Visit: Payer: Self-pay | Admitting: Primary Care

## 2023-05-20 DIAGNOSIS — J449 Chronic obstructive pulmonary disease, unspecified: Secondary | ICD-10-CM

## 2023-05-23 ENCOUNTER — Ambulatory Visit: Payer: Managed Care, Other (non HMO) | Admitting: Primary Care

## 2023-05-23 ENCOUNTER — Encounter: Payer: Self-pay | Admitting: Primary Care

## 2023-05-23 VITALS — BP 116/70 | HR 93 | Temp 98.3°F | Ht 63.0 in | Wt 192.0 lb

## 2023-05-23 DIAGNOSIS — R519 Headache, unspecified: Secondary | ICD-10-CM | POA: Diagnosis not present

## 2023-05-23 DIAGNOSIS — F419 Anxiety disorder, unspecified: Secondary | ICD-10-CM

## 2023-05-23 DIAGNOSIS — F32A Depression, unspecified: Secondary | ICD-10-CM

## 2023-05-23 MED ORDER — ESCITALOPRAM OXALATE 10 MG PO TABS
10.0000 mg | ORAL_TABLET | Freq: Every day | ORAL | 0 refills | Status: DC
Start: 2023-05-23 — End: 2023-08-14

## 2023-05-23 MED ORDER — SUMATRIPTAN SUCCINATE 50 MG PO TABS: ORAL_TABLET | ORAL | 0 refills | Status: DC

## 2023-05-23 NOTE — Patient Instructions (Signed)
Start escitalopram (Lexapro) 10 mg for anxiety and depression. Take 1/2 tablet by mouth once daily for about one week, then increase to 1 full tablet thereafter.   For the bad headaches: Start sumatriptan 50 mg tablets.  Take 1 tablet by mouth at migraine onset.  May repeat with another tablet 2 hours later if migraine persists.  Please schedule a follow-up visit for 6 weeks.  It was a pleasure to see you today!

## 2023-05-23 NOTE — Assessment & Plan Note (Signed)
Uncontrolled.  Discussed that she didn't give Wellbutrin enough time. Regardless, given her ongoing anxiety, will try Lexapro 10 mg. Rx sent to pharmacy.  Start with 1/2 tablet daily x 1 week, then increase to 1 full tablet thereafter.  Follow up in 6 weeks.

## 2023-05-23 NOTE — Progress Notes (Signed)
Subjective:    Patient ID: Denise [name], female    DOB: September 30, 1960, 61 y.o.   MRN: [id number]  Anxiety Symptoms include nervous/anxious behavior. Patient reports no chest pain or shortness of breath.      Denise [name] is a very pleasant 62 y.o. female with a history of hypertension, cardiomegaly, AVM of colon, CAD, COPD, tobacco use, anxiety depression iron deficiency anemia who presents today to discuss several concerns.  1) Anxiety and Depression: Chronic history.  Currently managed on bupropion XL 150 mg daily for which was initiated 1 month ago per patient request.  Previously managed on Zoloft but this was ineffective.   She is also managed on medical leave for increased anxiety over her health and numerous doctors appointments and tests.  She is also experiencing reduction of vision to the right eye and is pending evaluation by pulmonology for an enlarging lung nodule. She is transitioning to short term disability and is needing a form completed today.   Today she discusses that she thinks that the Zoloft was effective previously. She continues to feel stressed and anxious with worrying, fatigue at night, over thinking things. She doesn't feel that the Wellbutrin was helpful, she took it for about 2 weeks then stopped.   2) Frequent Headaches: Chronic for the last 4 months and located to the right frontal lobe and behind her right eye. Headaches occur 2-3 times weekly, lasting several hours, with moderate to extreme pain. She does experience intermittent nausea, photophobia, phonophobia. She's been taking Extra Strength Tylenol.   She is currently under evaluation per ophthalmology for reduced vision to blockages to the right eye. She was told that her eye pressure is high. She is undergoing eye injections currently. She isn't sure if her headaches are related.   She has no prior history of migraines. She believes her sister had migraines.   She underwent MRI brain  without contrast in July 2024 which showed mild to moderate cerebral white matter disease, otherwise no acute intracranial abnormality.  BP Readings from Last 3 Encounters:  05/23/23 116/70  04/16/23 136/78  02/21/23 105/84     Review of Systems  Eyes:  Positive for photophobia.  Respiratory:  Negative for shortness of breath.   Cardiovascular:  Negative for chest pain.  Neurological:  Positive for headaches.  Psychiatric/Behavioral:  The patient is nervous/anxious.          Past Medical History:  Diagnosis Date   Allergy    BMI 35.0-35.9,adult 03/31/2012   Chickenpox    Cholecystitis with cholelithiasis 03/31/2012   Heart murmur    Hypertension 03/31/2012   Shingles     Social History   Socioeconomic History   Marital status: Widowed    Spouse name: Not on file   Number of children: Not on file   Years of education: Not on file   Highest education level: Associate degree: occupational, Scientist, product/process development, or vocational program  Occupational History   Not on file  Tobacco Use   Smoking status: Former    Current packs/day: 0.00    Average packs/day: 0.8 packs/day for 34.0 years (25.5 ttl pk-yrs)    Types: Cigarettes    Start date: 05/06/1985    Quit date: 05/07/2019    Years since quitting: 4.0   Smokeless tobacco: Never  Vaping Use   Vaping status: Never Used  Substance and Sexual Activity   Alcohol use: No   Drug use: No   Sexual activity: Never    Birth control/protection:  None, Post-menopausal  Other Topics Concern   Not on file  Social History Narrative   Widow.    2 children.   Works in Clinical biochemist.   Enjoys reading, cooking.    Social Determinants of Health   Financial Resource Strain: Low Risk  (04/15/2023)   Overall Financial Resource Strain (CARDIA)    Difficulty of Paying Living Expenses: Not very hard  Food Insecurity: No Food Insecurity (04/15/2023)   Hunger Vital Sign    Worried About Running Out of Food in the Last Year: Never true    Ran Out  of Food in the Last Year: Never true  Transportation Needs: No Transportation Needs (04/15/2023)   PRAPARE - Administrator, Civil Service (Medical): No    Lack of Transportation (Non-Medical): No  Physical Activity: Insufficiently Active (04/15/2023)   Exercise Vital Sign    Days of Exercise per Week: 3 days    Minutes of Exercise per Session: 30 min  Stress: Stress Concern Present (04/15/2023)   [name] of Occupational Health - Occupational Stress Questionnaire    Feeling of Stress : Very much  Social Connections: Unknown (04/15/2023)   Social Connection and Isolation Panel [NHANES]    Frequency of Communication with Friends and Family: Three times a week    Frequency of Social Gatherings with Friends and Family: Patient declined    Attends Religious Services: Patient declined    Active Member of Clubs or Organizations: No    Attends Engineer, structural: Not on file    Marital Status: Widowed  Intimate Partner Violence: Not on file    Past Surgical History:  Procedure Laterality Date   BIOPSY  12/24/2019   Procedure: BIOPSY;  Surgeon: Rachael Fee, MD;  Location: [facility] ENDOSCOPY;  Service: Endoscopy;;   CHOLECYSTECTOMY  03/30/2012   Procedure: LAPAROSCOPIC CHOLECYSTECTOMY WITH INTRAOPERATIVE CHOLANGIOGRAM;  Surgeon: Mariella Saa, MD;  Location: [facility] ORS;  Service: General;  Laterality: N/A;   COLONOSCOPY WITH PROPOFOL N/A 12/24/2019   Procedure: COLONOSCOPY WITH PROPOFOL;  Surgeon: Rachael Fee, MD;  Location: [facility] ENDOSCOPY;  Service: Endoscopy;  Laterality: N/A;   ESOPHAGOGASTRODUODENOSCOPY (EGD) WITH PROPOFOL N/A 12/24/2019   Procedure: ESOPHAGOGASTRODUODENOSCOPY (EGD) WITH PROPOFOL;  Surgeon: Rachael Fee, MD;  Location: [facility] ENDOSCOPY;  Service: Endoscopy;  Laterality: N/A;   HOT HEMOSTASIS N/A 12/24/2019   Procedure: HOT HEMOSTASIS (ARGON PLASMA COAGULATION/BICAP);  Surgeon: Rachael Fee, MD;  Location: [facility] ENDOSCOPY;  Service: Endoscopy;   Laterality: N/A;   POLYPECTOMY  12/24/2019   Procedure: POLYPECTOMY;  Surgeon: Rachael Fee, MD;  Location: [facility] ENDOSCOPY;  Service: Endoscopy;;   TUBAL LIGATION      Family History  Problem Relation Age of Onset   Arthritis Mother    Cervical cancer Mother    Heart disease Father    Hypertension Father     Allergies  Allergen Reactions   Codeine Itching    Current Outpatient Medications on File Prior to Visit  Medication Sig Dispense Refill   albuterol (VENTOLIN HFA) 108 (90 Base) MCG/ACT inhaler INHALE 2 PUFFS INTO THE LUNGS EVERY 4-6 HOURS AS NEEDED FOR TIGHTNESS/WHEEZING 8.5 each 0   amLODipine (NORVASC) 10 MG tablet TAKE 1 TABLET BY MOUTH EVERY DAY FOR BLOOD PRESSURE 90 tablet 2   Ascorbic Acid (VITAMIN C) 1000 MG tablet Take 1,000 mg by mouth daily.     aspirin EC 81 MG tablet Take 81 mg by mouth daily.     atorvastatin (LIPITOR) 80 MG  tablet Take 1 tablet (80 mg total) by mouth daily. for cholesterol. 90 tablet 3   famotidine (PEPCID) 20 MG tablet Take 20 mg by mouth daily as needed for heartburn or indigestion.     ferrous gluconate (FERGON) 324 MG tablet TAKE 1 TABLET BY MOUTH DAILY WITH BREAKFAST 90 tablet 3   fluticasone-salmeterol (WIXELA INHUB) 250-50 MCG/ACT AEPB INHALE 1 PUFF INTO THE LUNGS IN THE MORNING AND AT BEDTIME. 180 each 0   lisinopril (ZESTRIL) 30 MG tablet TAKE 1 TABLET (30 MG TOTAL) BY MOUTH DAILY. FOR BLOOD PRESSURE. 90 tablet 2   pyridOXINE (VITAMIN B-6) 25 MG tablet Take 25 mg by mouth daily.     vitamin B-12 (CYANOCOBALAMIN) 1000 MCG tablet Take 1,000 mcg by mouth daily.     vitamin E 180 MG (400 UNITS) capsule Take 800 Units by mouth daily.     No current facility-administered medications on file prior to visit.    BP 116/70 (BP Location: Left Arm, Patient Position: Sitting, Cuff Size: Large)   Pulse 93   Temp 98.3 F (36.8 C) (Oral)   Ht 5\' 3"  (1.6 m)   Wt 192 lb (87.1 kg)   SpO2 95%   BMI 34.01 kg/m  Objective:   Physical  Exam Cardiovascular:     Rate and Rhythm: Normal rate and regular rhythm.  Pulmonary:     Effort: Pulmonary effort is normal.     Breath sounds: Normal breath sounds.  Musculoskeletal:     Cervical back: Neck supple.  Skin:    General: Skin is warm and dry.  Neurological:     Mental Status: She is alert and oriented to person, place, and time.  Psychiatric:        Mood and Affect: Mood normal.           Assessment & Plan:  Anxiety and depression Assessment & Plan: Uncontrolled.  Discussed that she didn't give Wellbutrin enough time. Regardless, given her ongoing anxiety, will try Lexapro 10 mg. Rx sent to pharmacy.  Start with 1/2 tablet daily x 1 week, then increase to 1 full tablet thereafter.  Follow up in 6 weeks.  Orders: -     Escitalopram Oxalate; Take 1 tablet (10 mg total) by mouth daily. for anxiety and depression.  Dispense: 90 tablet; Refill: 0  Frequent headaches Assessment & Plan: Uncontrolled. Representative of migraines.  Discussed that she should discuss headaches with ophthalmologist.  Discussed options for treatment, we will start with sumatriptan. Sumatriptan 50 mg tablet sent to pharmacy.  Discussed instructions for use.  She will update.  Orders: -     SUMAtriptan Succinate; Take 1 tablet at migraine onset. May repeat in 2 hours if headache persists or recurs.  Dispense: 10 tablet; Refill: 0        Doreene Nest, NP

## 2023-05-23 NOTE — Assessment & Plan Note (Signed)
Uncontrolled. Representative of migraines.  Discussed that she should discuss headaches with ophthalmologist.  Discussed options for treatment, we will start with sumatriptan. Sumatriptan 50 mg tablet sent to pharmacy.  Discussed instructions for use.  She will update.

## 2023-05-27 NOTE — Telephone Encounter (Signed)
Paperwork received via interoffice mail on 10.21.24 stating that medical recordds had been pulled and billed, but asking that the form be completed and faxed back  Paperwork placed in providers box

## 2023-05-31 NOTE — Telephone Encounter (Signed)
Noted, form completed and placed in Kelli's inbox.

## 2023-05-31 NOTE — Telephone Encounter (Signed)
Called patient she states that she thought it was going to be decided after she is seen by all the specialist. If date needs to be on there she would like to have it at around three months from now.

## 2023-05-31 NOTE — Telephone Encounter (Signed)
Received in completed paperwork. I just need a return to work date.  Please call the patient and ask her which day she plans on returning to work.

## 2023-06-03 NOTE — Telephone Encounter (Signed)
PPW has been faxed  

## 2023-06-03 NOTE — Progress Notes (Unsigned)
Patient name: Denise [name] MRN: [id number] DOB: 1960-09-20 Sex: female  REASON FOR CONSULT: Carotid artery disease  HPI: Denise [name] is a 62 y.o. female, with history of hypertension that presents for evaluation of carotid artery disease.  Patient had carotid ultrasound on 04/23/2023 with findings suggesting hemodynamically significant stenosis in the right carotid artery distal to a significant anterior wall plaque with shadowing and also a high-grade left carotid stenosis 70 to 99%.  Past Medical History:  Diagnosis Date   Allergy    BMI 35.0-35.9,adult 03/31/2012   Chickenpox    Cholecystitis with cholelithiasis 03/31/2012   Heart murmur    Hypertension 03/31/2012   Shingles     Past Surgical History:  Procedure Laterality Date   BIOPSY  12/24/2019   Procedure: BIOPSY;  Surgeon: Rachael Fee, MD;  Location: [facility] ENDOSCOPY;  Service: Endoscopy;;   CHOLECYSTECTOMY  03/30/2012   Procedure: LAPAROSCOPIC CHOLECYSTECTOMY WITH INTRAOPERATIVE CHOLANGIOGRAM;  Surgeon: Mariella Saa, MD;  Location: [facility] ORS;  Service: General;  Laterality: N/A;   COLONOSCOPY WITH PROPOFOL N/A 12/24/2019   Procedure: COLONOSCOPY WITH PROPOFOL;  Surgeon: Rachael Fee, MD;  Location: [facility] ENDOSCOPY;  Service: Endoscopy;  Laterality: N/A;   ESOPHAGOGASTRODUODENOSCOPY (EGD) WITH PROPOFOL N/A 12/24/2019   Procedure: ESOPHAGOGASTRODUODENOSCOPY (EGD) WITH PROPOFOL;  Surgeon: Rachael Fee, MD;  Location: [facility] ENDOSCOPY;  Service: Endoscopy;  Laterality: N/A;   HOT HEMOSTASIS N/A 12/24/2019   Procedure: HOT HEMOSTASIS (ARGON PLASMA COAGULATION/BICAP);  Surgeon: Rachael Fee, MD;  Location[facility] ENDOSCOPY;  Service: Endoscopy;  Laterality: N/A;   POLYPECTOMY  12/24/2019   Procedure: POLYPECTOMY;  Surgeon: Rachael Fee, MD;  Location: [facility] ENDOSCOPY;  Service: Endoscopy;;   TUBAL LIGATION      Family History  Problem Relation Age of Onset   Arthritis Mother    Cervical cancer Mother     Heart disease Father    Hypertension Father     SOCIAL HISTORY: Social History   Socioeconomic History   Marital status: Widowed    Spouse name: Not on file   Number of children: Not on file   Years of education: Not on file   Highest education level: Associate degree: occupational, Scientist, product/process development, or vocational program  Occupational History   Not on file  Tobacco Use   Smoking status: Former    Current packs/day: 0.00    Average packs/day: 0.8 packs/day for 34.0 years (25.5 ttl pk-yrs)    Types: Cigarettes    Start date: 05/06/1985    Quit date: 05/07/2019    Years since quitting: 4.0   Smokeless tobacco: Never  Vaping Use   Vaping status: Never Used  Substance and Sexual Activity   Alcohol use: No   Drug use: No   Sexual activity: Never    Birth control/protection: None, Post-menopausal  Other Topics Concern   Not on file  Social History Narrative   Widow.    2 children.   Works in Clinical biochemist.   Enjoys reading, cooking.    Social Determinants of Health   Financial Resource Strain: Low Risk  (04/15/2023)   Overall Financial Resource Strain (CARDIA)    Difficulty of Paying Living Expenses: Not very hard  Food Insecurity: No Food Insecurity (04/15/2023)   Hunger Vital Sign    Worried About Running Out of Food in the Last Year: Never true    Ran Out of Food in the Last Year: Never true  Transportation Needs: No Transportation Needs (04/15/2023)   PRAPARE -  Administrator, Civil Service (Medical): No    Lack of Transportation (Non-Medical): No  Physical Activity: Insufficiently Active (04/15/2023)   Exercise Vital Sign    Days of Exercise per Week: 3 days    Minutes of Exercise per Session: 30 min  Stress: Stress Concern Present (04/15/2023)   [name] of Occupational Health - Occupational Stress Questionnaire    Feeling of Stress : Very much  Social Connections: Unknown (04/15/2023)   Social Connection and Isolation Panel [NHANES]    Frequency of  Communication with Friends and Family: Three times a week    Frequency of Social Gatherings with Friends and Family: Patient declined    Attends Religious Services: Patient declined    Active Member of Clubs or Organizations: No    Attends Engineer, structural: Not on file    Marital Status: Widowed  Catering manager Violence: Not on file    Allergies  Allergen Reactions   Codeine Itching    Current Outpatient Medications  Medication Sig Dispense Refill   albuterol (VENTOLIN HFA) 108 (90 Base) MCG/ACT inhaler INHALE 2 PUFFS INTO THE LUNGS EVERY 4-6 HOURS AS NEEDED FOR TIGHTNESS/WHEEZING 8.5 each 0   amLODipine (NORVASC) 10 MG tablet TAKE 1 TABLET BY MOUTH EVERY DAY FOR BLOOD PRESSURE 90 tablet 2   Ascorbic Acid (VITAMIN C) 1000 MG tablet Take 1,000 mg by mouth daily.     aspirin EC 81 MG tablet Take 81 mg by mouth daily.     atorvastatin (LIPITOR) 80 MG tablet Take 1 tablet (80 mg total) by mouth daily. for cholesterol. 90 tablet 3   escitalopram (LEXAPRO) 10 MG tablet Take 1 tablet (10 mg total) by mouth daily. for anxiety and depression. 90 tablet 0   famotidine (PEPCID) 20 MG tablet Take 20 mg by mouth daily as needed for heartburn or indigestion.     ferrous gluconate (FERGON) 324 MG tablet TAKE 1 TABLET BY MOUTH DAILY WITH BREAKFAST 90 tablet 3   fluticasone-salmeterol (WIXELA INHUB) 250-50 MCG/ACT AEPB INHALE 1 PUFF INTO THE LUNGS IN THE MORNING AND AT BEDTIME. 180 each 0   lisinopril (ZESTRIL) 30 MG tablet TAKE 1 TABLET (30 MG TOTAL) BY MOUTH DAILY. FOR BLOOD PRESSURE. 90 tablet 2   pyridOXINE (VITAMIN B-6) 25 MG tablet Take 25 mg by mouth daily.     SUMAtriptan (IMITREX) 50 MG tablet Take 1 tablet at migraine onset. May repeat in 2 hours if headache persists or recurs. 10 tablet 0   vitamin B-12 (CYANOCOBALAMIN) 1000 MCG tablet Take 1,000 mcg by mouth daily.     vitamin E 180 MG (400 UNITS) capsule Take 800 Units by mouth daily.     No current facility-administered  medications for this visit.    REVIEW OF SYSTEMS:  [X]  denotes positive finding, [ ]  denotes negative finding Cardiac  Comments:  Chest pain or chest pressure: ***   Shortness of breath upon exertion:    Short of breath when lying flat:    Irregular heart rhythm:        Vascular    Pain in calf, thigh, or hip brought on by ambulation:    Pain in feet at night that wakes you up from your sleep:     Blood clot in your veins:    Leg swelling:         Pulmonary    Oxygen at home:    Productive cough:     Wheezing:  Neurologic    Sudden weakness in arms or legs:     Sudden numbness in arms or legs:     Sudden onset of difficulty speaking or slurred speech:    Temporary loss of vision in one eye:     Problems with dizziness:         Gastrointestinal    Blood in stool:     Vomited blood:         Genitourinary    Burning when urinating:     Blood in urine:        Psychiatric    Major depression:         Hematologic    Bleeding problems:    Problems with blood clotting too easily:        Skin    Rashes or ulcers:        Constitutional    Fever or chills:      PHYSICAL EXAM: There were no vitals filed for this visit.  GENERAL: The patient is a well-nourished female, in no acute distress. The vital signs are documented above. CARDIAC: There is a regular rate and rhythm.  VASCULAR: *** PULMONARY: There is good air exchange bilaterally without wheezing or rales. ABDOMEN: Soft and non-tender with normal pitched bowel sounds.  MUSCULOSKELETAL: There are no major deformities or cyanosis. NEUROLOGIC: No focal weakness or paresthesias are detected. SKIN: There are no ulcers or rashes noted. PSYCHIATRIC: The patient has a normal affect.  DATA:   US Carotid 04/23/23:    IMPRESSION: Right:   Although the measured velocities of the right carotid bifurcation are not elevated, note that the flow velocities of the right ICA were obtained from an area distal to  the maximum narrowing due to the presence of anterior wall plaque with shadowing. Given the degree of atherosclerotic plaque and the abnormal waveform distally, a hemodynamically significant stenosis at the right carotid bifurcation is favored. If establishing a more accurate degree of stenosis is required, cerebral angiogram should be considered, or as a second best test, CTA.   Left:   Heterogeneous and partially calcified plaque at the left carotid bifurcation contributes to 70%-99% stenosis.   Asymmetric upper extremity blood pressures measured, lesser on the left, suggesting left subclavian artery stenosis. Correlation with office based upper extremity pressures may be useful.   Signed,   Yvone Neu. Reyne Dumas, ABVM, RPVI   [facility]  Assessment/Plan:  62 year old female with history of hypertension the presents for evaluation of carotid artery disease.  She had a carotid ultrasound on 04/23/2023 showing a high-grade 70 to 99% left ICA stenosis and there is also suggestion of a possible high-grade contralateral stenosis but difficult to establish with significant anterior wall plaque with shadowing.   Cephus Shelling, MD [facility] [place] Office: [phone]

## 2023-06-04 ENCOUNTER — Ambulatory Visit (INDEPENDENT_AMBULATORY_CARE_PROVIDER_SITE_OTHER): Payer: Managed Care, Other (non HMO) | Admitting: Vascular Surgery

## 2023-06-04 ENCOUNTER — Encounter: Payer: Self-pay | Admitting: Vascular Surgery

## 2023-06-04 VITALS — BP 137/92 | HR 87 | Temp 97.7°F | Wt 190.0 lb

## 2023-06-04 DIAGNOSIS — I6523 Occlusion and stenosis of bilateral carotid arteries: Secondary | ICD-10-CM | POA: Diagnosis not present

## 2023-06-04 DIAGNOSIS — I779 Disorder of arteries and arterioles, unspecified: Secondary | ICD-10-CM | POA: Insufficient documentation

## 2023-06-05 ENCOUNTER — Other Ambulatory Visit: Payer: Self-pay

## 2023-06-05 DIAGNOSIS — I6523 Occlusion and stenosis of bilateral carotid arteries: Secondary | ICD-10-CM

## 2023-06-20 ENCOUNTER — Ambulatory Visit
Admission: RE | Admit: 2023-06-20 | Discharge: 2023-06-20 | Disposition: A | Discharge status: Home / Self Care | Payer: Managed Care, Other (non HMO) | Source: Ambulatory Visit | Attending: Vascular Surgery | Admitting: Vascular Surgery

## 2023-06-20 DIAGNOSIS — I6523 Occlusion and stenosis of bilateral carotid arteries: Secondary | ICD-10-CM

## 2023-06-20 MED ORDER — IOPAMIDOL (ISOVUE-370) INJECTION 76%
200.0000 mL | Freq: Once | INTRAVENOUS | Status: AC | PRN
  Administered 2023-06-20: 75 mL via INTRAVENOUS

## 2023-07-01 NOTE — Progress Notes (Unsigned)
Patient name: Denise [name] MRN: [id number] DOB: 07/08/1961 Sex: female  REASON FOR CONSULT: Carotid artery disease  HPI: Denise [name] is a 62 y.o. female, with history of hypertension that presents for follow-up after CTA neck for evaluation of carotid artery disease.    Patient states in April or May of this year she started having blurry vision in her right eye.  Ultimately this led to evaluation by ophthalmology and she states she was told she had evidence of diabetic retinopathy although she has no history of diabetes.  States she is now undergoing eye injections.  As part of the workup she did have carotid ultrasound.  Patient had carotid ultrasound on 04/23/2023 with findings suggesting hemodynamically significant stenosis in the right carotid artery distal to a significant anterior wall plaque with shadowing and also a high-grade left carotid stenosis 70 to 99%.  She denies any lateralizing symptoms or any other history of stroke or TIA in the past.  She did have an MRI brain in the ED on 02/21/2023 with no acute intracranial abnormality.  No history of neck surgery or neck radiation.  Quit smoking in the past.  Past Medical History:  Diagnosis Date   Allergy    BMI 35.0-35.9,adult 03/31/2012   Chickenpox    Cholecystitis with cholelithiasis 03/31/2012   Heart murmur    Hypertension 03/31/2012   Shingles     Past Surgical History:  Procedure Laterality Date   BIOPSY  12/24/2019   Procedure: BIOPSY;  Surgeon: Rachael Fee, MD;  Location: [facility] ENDOSCOPY;  Service: Endoscopy;;   CHOLECYSTECTOMY  03/30/2012   Procedure: LAPAROSCOPIC CHOLECYSTECTOMY WITH INTRAOPERATIVE CHOLANGIOGRAM;  Surgeon: Mariella Saa, MD;  Location: [facility] ORS;  Service: General;  Laterality: N/A;   COLONOSCOPY WITH PROPOFOL N/A 12/24/2019   Procedure: COLONOSCOPY WITH PROPOFOL;  Surgeon: Rachael Fee, MD;  Location: [facility] ENDOSCOPY;  Service: Endoscopy;  Laterality: N/A;    ESOPHAGOGASTRODUODENOSCOPY (EGD) WITH PROPOFOL N/A 12/24/2019   Procedure: ESOPHAGOGASTRODUODENOSCOPY (EGD) WITH PROPOFOL;  Surgeon: Rachael Fee, MD;  Location: [facility] ENDOSCOPY;  Service: Endoscopy;  Laterality: N/A;   HOT HEMOSTASIS N/A 12/24/2019   Procedure: HOT HEMOSTASIS (ARGON PLASMA COAGULATION/BICAP);  Surgeon: Rachael Fee, MD;  Location: [facility] ENDOSCOPY;  Service: Endoscopy;  Laterality: N/A;   POLYPECTOMY  12/24/2019   Procedure: POLYPECTOMY;  Surgeon: Rachael Fee, MD;  Location: [facility] ENDOSCOPY;  Service: Endoscopy;;   TUBAL LIGATION      Family History  Problem Relation Age of Onset   Arthritis Mother    Cervical cancer Mother    Heart disease Father    Hypertension Father     SOCIAL HISTORY: Social History   Socioeconomic History   Marital status: Widowed    Spouse name: Not on file   Number of children: Not on file   Years of education: Not on file   Highest education level: Associate degree: occupational, Scientist, product/process development, or vocational program  Occupational History   Not on file  Tobacco Use   Smoking status: Former    Current packs/day: 0.00    Average packs/day: 0.8 packs/day for 34.0 years (25.5 ttl pk-yrs)    Types: Cigarettes    Start date: 05/06/1985    Quit date: 05/07/2019    Years since quitting: 4.1   Smokeless tobacco: Never  Vaping Use   Vaping status: Never Used  Substance and Sexual Activity   Alcohol use: No   Drug use: No   Sexual activity: Never  Birth control/protection: None, Post-menopausal  Other Topics Concern   Not on file  Social History Narrative   Widow.    2 children.   Works in Clinical biochemist.   Enjoys reading, cooking.    Social Determinants of Health   Financial Resource Strain: Low Risk  (04/15/2023)   Overall Financial Resource Strain (CARDIA)    Difficulty of Paying Living Expenses: Not very hard  Food Insecurity: No Food Insecurity (04/15/2023)   Hunger Vital Sign    Worried About Running Out of Food in the Last  Year: Never true    Ran Out of Food in the Last Year: Never true  Transportation Needs: No Transportation Needs (04/15/2023)   PRAPARE - Administrator, Civil Service (Medical): No    Lack of Transportation (Non-Medical): No  Physical Activity: Insufficiently Active (04/15/2023)   Exercise Vital Sign    Days of Exercise per Week: 3 days    Minutes of Exercise per Session: 30 min  Stress: Stress Concern Present (04/15/2023)   [name] of Occupational Health - Occupational Stress Questionnaire    Feeling of Stress : Very much  Social Connections: Unknown (04/15/2023)   Social Connection and Isolation Panel [NHANES]    Frequency of Communication with Friends and Family: Three times a week    Frequency of Social Gatherings with Friends and Family: Patient declined    Attends Religious Services: Patient declined    Active Member of Clubs or Organizations: No    Attends Engineer, structural: Not on file    Marital Status: Widowed  Catering manager Violence: Not on file    Allergies  Allergen Reactions   Codeine Itching    Current Outpatient Medications  Medication Sig Dispense Refill   albuterol (VENTOLIN HFA) 108 (90 Base) MCG/ACT inhaler INHALE 2 PUFFS INTO THE LUNGS EVERY 4-6 HOURS AS NEEDED FOR TIGHTNESS/WHEEZING 8.5 each 0   amLODipine (NORVASC) 10 MG tablet TAKE 1 TABLET BY MOUTH EVERY DAY FOR BLOOD PRESSURE 90 tablet 2   Ascorbic Acid (VITAMIN C) 1000 MG tablet Take 1,000 mg by mouth daily.     aspirin EC 81 MG tablet Take 81 mg by mouth daily.     atorvastatin (LIPITOR) 80 MG tablet Take 1 tablet (80 mg total) by mouth daily. for cholesterol. 90 tablet 3   escitalopram (LEXAPRO) 10 MG tablet Take 1 tablet (10 mg total) by mouth daily. for anxiety and depression. 90 tablet 0   famotidine (PEPCID) 20 MG tablet Take 20 mg by mouth daily as needed for heartburn or indigestion.     ferrous gluconate (FERGON) 324 MG tablet TAKE 1 TABLET BY MOUTH DAILY WITH  BREAKFAST 90 tablet 3   fluticasone-salmeterol (WIXELA INHUB) 250-50 MCG/ACT AEPB INHALE 1 PUFF INTO THE LUNGS IN THE MORNING AND AT BEDTIME. 180 each 0   lisinopril (ZESTRIL) 30 MG tablet TAKE 1 TABLET (30 MG TOTAL) BY MOUTH DAILY. FOR BLOOD PRESSURE. 90 tablet 2   pyridOXINE (VITAMIN B-6) 25 MG tablet Take 25 mg by mouth daily.     SUMAtriptan (IMITREX) 50 MG tablet Take 1 tablet at migraine onset. May repeat in 2 hours if headache persists or recurs. 10 tablet 0   vitamin B-12 (CYANOCOBALAMIN) 1000 MCG tablet Take 1,000 mcg by mouth daily.     vitamin E 180 MG (400 UNITS) capsule Take 800 Units by mouth daily.     No current facility-administered medications for this visit.    REVIEW OF SYSTEMS:  [X]   denotes positive finding, [ ]  denotes negative finding Cardiac  Comments:  Chest pain or chest pressure:    Shortness of breath upon exertion:    Short of breath when lying flat:    Irregular heart rhythm:        Vascular    Pain in calf, thigh, or hip brought on by ambulation:    Pain in feet at night that wakes you up from your sleep:     Blood clot in your veins:    Leg swelling:         Pulmonary    Oxygen at home:    Productive cough:     Wheezing:         Neurologic    Sudden weakness in arms or legs:     Sudden numbness in arms or legs:     Sudden onset of difficulty speaking or slurred speech:    Temporary loss of vision in one eye:     Problems with dizziness:         Gastrointestinal    Blood in stool:     Vomited blood:         Genitourinary    Burning when urinating:     Blood in urine:        Psychiatric    Major depression:         Hematologic    Bleeding problems:    Problems with blood clotting too easily:        Skin    Rashes or ulcers:        Constitutional    Fever or chills:      PHYSICAL EXAM: There were no vitals filed for this visit.  GENERAL: The patient is a well-nourished female, in no acute distress. The vital signs are  documented above. CARDIAC: There is a regular rate and rhythm.  VASCULAR:  No previous neck incisions PULMONARY: No respiratory distress. ABDOMEN: Soft and non-tender. MUSCULOSKELETAL: There are no major deformities or cyanosis. NEUROLOGIC: No focal weakness or paresthesias are detected.  Grossly neurologically intact SKIN: There are no ulcers or rashes noted. PSYCHIATRIC: The patient has a normal affect.  DATA:   US Carotid 04/23/23:    IMPRESSION: Right:   Although the measured velocities of the right carotid bifurcation are not elevated, note that the flow velocities of the right ICA were obtained from an area distal to the maximum narrowing due to the presence of anterior wall plaque with shadowing. Given the degree of atherosclerotic plaque and the abnormal waveform distally, a hemodynamically significant stenosis at the right carotid bifurcation is favored. If establishing a more accurate degree of stenosis is required, cerebral angiogram should be considered, or as a second best test, CTA.   Left:   Heterogeneous and partially calcified plaque at the left carotid bifurcation contributes to 70%-99% stenosis.   Asymmetric upper extremity blood pressures measured, lesser on the left, suggesting left subclavian artery stenosis. Correlation with office based upper extremity pressures may be useful.   Signed,   Yvone Neu. Reyne Dumas, ABVM, RPVI   [facility]  Assessment/Plan:  62 year old female with history of hypertension the presents for follow-up after CTA neck for evaluation of carotid artery disease.  Apparently has been having blurry vision in her right eye since April or May of this year and has been evaluated with ophthalmology and she reports findings consistent with diabetic retinopathy although she has no history of diabetes.  As part of this  workup she had a carotid ultrasound on 04/23/2023 showing a high-grade 70 to 99%  left ICA stenosis and there is also suggestion of a possible high-grade contralateral stenosis on the right but difficult to establish with significant anterior wall plaque with shadowing.  Ultimately I have recommended CTA neck to further evaluate her carotid arteries.  I think the right side is the side of interest at this time given her vision changes although I am not certain this represents a truly symptomatic lesion.  I will have her follow-up with me after the CTA is done and I will review the images.  She also has evidence of a contralateral high-grade left ICA stenosis that likely will require intervention.  I discussed briefly the options of TCAR versus carotid endarterectomy but will further evaluate options once we get her CTA neck to confirm the degree of stenosis.  She is on aspirin statin for risk reduction.   Cephus Shelling, MD [facility] [place] Office: [phone]

## 2023-07-02 ENCOUNTER — Ambulatory Visit (INDEPENDENT_AMBULATORY_CARE_PROVIDER_SITE_OTHER): Payer: Managed Care, Other (non HMO) | Admitting: Vascular Surgery

## 2023-07-02 ENCOUNTER — Encounter: Payer: Self-pay | Admitting: *Deleted

## 2023-07-02 ENCOUNTER — Telehealth: Payer: Self-pay | Admitting: *Deleted

## 2023-07-02 ENCOUNTER — Encounter: Payer: Self-pay | Admitting: Vascular Surgery

## 2023-07-02 VITALS — BP 158/69 | HR 97 | Temp 98.4°F | Ht 63.0 in | Wt 188.7 lb

## 2023-07-02 DIAGNOSIS — I6523 Occlusion and stenosis of bilateral carotid arteries: Secondary | ICD-10-CM

## 2023-07-02 NOTE — Telephone Encounter (Signed)
   Pre-operative Risk Assessment    Patient Name: Denise [name]  DOB: [birth year] MRN: [id number]{ HEARTCARE STAFF-IMPORTANT INSTRUCTIONS 1 Red and Blue Text will auto delete once note is signed or closed. 2 Press F2 to navigate through template.   3 On drop down lists, L click to select >> R click to activate next field 4 Reason for Visit format is IMPORTANT!!  See Directions on No. 2 below. 5 Please review chart to determine if there is already a clearance note open for this procedure!!  DO NOT duplicate if a note already exists!!   DATE OF LAST VISIT: NONE DATE OF NEXT VISIT: NONE-PT WILL NEED NEW PT APPT    Request for Surgical Clearance    Procedure:   RIGHT CEA  Date of Surgery:  Clearance TBD                                 Surgeon:  DR. Chestine Spore  Surgeon's Group or Practice Name:  [facility] Phone number:  [phone] Fax number:  [phone]   Type of Clearance Requested:   - Medical ; NONE INDICATED TO BE HELD   Type of Anesthesia:  Not Indicated   Additional requests/questions:    Elpidio Anis   07/02/2023, 3:14 PM

## 2023-07-03 NOTE — Telephone Encounter (Signed)
Pt is scheduled to see Dr. Elease Hashimoto, 09/06/23, clearance will be addressed at that time.  Will route to requesting surgeon's office to make them aware.

## 2023-07-11 ENCOUNTER — Telehealth: Payer: Self-pay | Admitting: Primary Care

## 2023-07-11 NOTE — Telephone Encounter (Signed)
There are two openings for tomorrow (07/12/23) can we put her in one of those slots?

## 2023-07-11 NOTE — Telephone Encounter (Signed)
Pt called requesting a appt with [facility] to review disability ppw & meds. Next available slot with [facility] isn't until 12/26. Pt mentioned needing ppw done between the next 5-10 days, to be able to turn it in. Is it possible another provider can comp ppw for pt? Please advise. Call back # [phone]

## 2023-07-11 NOTE — Telephone Encounter (Signed)
Scheduled patient tomorrow 07/12/23 @ 2:20pm

## 2023-07-12 ENCOUNTER — Ambulatory Visit (INDEPENDENT_AMBULATORY_CARE_PROVIDER_SITE_OTHER): Payer: Managed Care, Other (non HMO) | Admitting: Primary Care

## 2023-07-12 ENCOUNTER — Encounter: Payer: Self-pay | Admitting: Primary Care

## 2023-07-12 VITALS — BP 140/82 | HR 53 | Temp 98.1°F | Ht 63.0 in | Wt 188.0 lb

## 2023-07-12 DIAGNOSIS — I10 Essential (primary) hypertension: Secondary | ICD-10-CM | POA: Diagnosis not present

## 2023-07-12 DIAGNOSIS — F419 Anxiety disorder, unspecified: Secondary | ICD-10-CM | POA: Diagnosis not present

## 2023-07-12 DIAGNOSIS — I6523 Occlusion and stenosis of bilateral carotid arteries: Secondary | ICD-10-CM

## 2023-07-12 DIAGNOSIS — F32A Depression, unspecified: Secondary | ICD-10-CM | POA: Diagnosis not present

## 2023-07-12 NOTE — Progress Notes (Signed)
Subjective:    Patient ID: Denise [name], female    DOB: [birth year], 62 y.o.   MRN: [id number]  HPI  Denise [name] is a very pleasant 62 y.o. female with a history of hypertension, cardiomegaly, CAD, carotid artery disease, COPD, lung nodule, tobacco abuse, hyperlipidemia, iron deficiency anemia, anxiety depression who presents today for follow-up of anxiety depression.   She was last evaluated on 05/23/2023 for follow-up of anxiety depression and reevaluation of medical leave.  During this visit she continues to experience symptoms of anxiety and depression, admitted to not taking Wellbutrin for more than 2 weeks as she felt it was ineffective.  She did mention feeling better on Zoloft.  During this visit we initiated Lexapro 10 mg daily and continued her medical leave.  She is here for follow-up today.  Since her last visit she's noticed improvement since taking Lexapro. She has noticed that she's sleeping better. Her constant worrying has improved. She is wanting to continue with medical leave due to stress with her medical conditions, multiple doctors appointments, inability to focus or think, and a right carotid endarterectomy with date pending. She will be seeing cardiology in late January 2025 as she will need clearance prior to surgery.   She is currently approved for short term disability through December 31st. She will need extension due to potential surgery that may not occur until February 2025.  She does not check her BP at home.  She is compliant to both amlodipine 10 mg daily and lisinopril 30 mg daily.  She is anxious today, especially about her medical problems and the need for extension of her short-term disability.  BP Readings from Last 3 Encounters:  07/12/23 (!) 140/82  07/02/23 (!) 158/69  06/04/23 (!) 137/92      Review of Systems  Eyes:  Positive for visual disturbance.  Respiratory:  Negative for shortness of breath.   Cardiovascular:  Negative for  chest pain.  Psychiatric/Behavioral:  Positive for sleep disturbance. The patient is nervous/anxious.          Past Medical History:  Diagnosis Date   Allergy    BMI 35.0-35.9,adult 03/31/2012   Chickenpox    Cholecystitis with cholelithiasis 03/31/2012   Heart murmur    Hypertension 03/31/2012   Shingles     Social History   Socioeconomic History   Marital status: Widowed    Spouse name: Not on file   Number of children: Not on file   Years of education: Not on file   Highest education level: Associate degree: occupational, Scientist, product/process development, or vocational program  Occupational History   Not on file  Tobacco Use   Smoking status: Former    Current packs/day: 0.00    Average packs/day: 0.8 packs/day for 34.0 years (25.5 ttl pk-yrs)    Types: Cigarettes    Start date: 05/06/1985    Quit date: 05/07/2019    Years since quitting: 4.1   Smokeless tobacco: Never  Vaping Use   Vaping status: Never Used  Substance and Sexual Activity   Alcohol use: No   Drug use: No   Sexual activity: Never    Birth control/protection: None, Post-menopausal  Other Topics Concern   Not on file  Social History Narrative   Widow.    2 children.   Works in Clinical biochemist.   Enjoys reading, cooking.    Social Determinants of Health   Financial Resource Strain: Low Risk  (04/15/2023)   Overall Financial Resource Strain (CARDIA)  Difficulty of Paying Living Expenses: Not very hard  Food Insecurity: No Food Insecurity (04/15/2023)   Hunger Vital Sign    Worried About Running Out of Food in the Last Year: Never true    Ran Out of Food in the Last Year: Never true  Transportation Needs: No Transportation Needs (04/15/2023)   PRAPARE - Administrator, Civil Service (Medical): No    Lack of Transportation (Non-Medical): No  Physical Activity: Insufficiently Active (04/15/2023)   Exercise Vital Sign    Days of Exercise per Week: 3 days    Minutes of Exercise per Session: 30 min  Stress:  Stress Concern Present (04/15/2023)   [name] of Occupational Health - Occupational Stress Questionnaire    Feeling of Stress : Very much  Social Connections: Unknown (04/15/2023)   Social Connection and Isolation Panel [NHANES]    Frequency of Communication with Friends and Family: Three times a week    Frequency of Social Gatherings with Friends and Family: Patient declined    Attends Religious Services: Patient declined    Active Member of Clubs or Organizations: No    Attends Engineer, structural: Not on file    Marital Status: Widowed  Intimate Partner Violence: Not on file    Past Surgical History:  Procedure Laterality Date   BIOPSY  12/24/2019   Procedure: BIOPSY;  Surgeon: Rachael Fee, MD;  Location: [facility] ENDOSCOPY;  Service: Endoscopy;;   CHOLECYSTECTOMY  03/30/2012   Procedure: LAPAROSCOPIC CHOLECYSTECTOMY WITH INTRAOPERATIVE CHOLANGIOGRAM;  Surgeon: Mariella Saa, MD;  Location: [facility] ORS;  Service: General;  Laterality: N/A;   COLONOSCOPY WITH PROPOFOL N/A 12/24/2019   Procedure: COLONOSCOPY WITH PROPOFOL;  Surgeon: Rachael Fee, MD;  Location: [facility] ENDOSCOPY;  Service: Endoscopy;  Laterality: N/A;   ESOPHAGOGASTRODUODENOSCOPY (EGD) WITH PROPOFOL N/A 12/24/2019   Procedure: ESOPHAGOGASTRODUODENOSCOPY (EGD) WITH PROPOFOL;  Surgeon: Rachael Fee, MD;  Location: [facility] ENDOSCOPY;  Service: Endoscopy;  Laterality: N/A;   HOT HEMOSTASIS N/A 12/24/2019   Procedure: HOT HEMOSTASIS (ARGON PLASMA COAGULATION/BICAP);  Surgeon: Rachael Fee, MD;  Location: [facility] ENDOSCOPY;  Service: Endoscopy;  Laterality: N/A;   POLYPECTOMY  12/24/2019   Procedure: POLYPECTOMY;  Surgeon: Rachael Fee, MD;  Location: [facility] ENDOSCOPY;  Service: Endoscopy;;   TUBAL LIGATION      Family History  Problem Relation Age of Onset   Arthritis Mother    Cervical cancer Mother    Heart disease Father    Hypertension Father     Allergies  Allergen Reactions   Codeine Itching     Current Outpatient Medications on File Prior to Visit  Medication Sig Dispense Refill   albuterol (VENTOLIN HFA) 108 (90 Base) MCG/ACT inhaler INHALE 2 PUFFS INTO THE LUNGS EVERY 4-6 HOURS AS NEEDED FOR TIGHTNESS/WHEEZING 8.5 each 0   amLODipine (NORVASC) 10 MG tablet TAKE 1 TABLET BY MOUTH EVERY DAY FOR BLOOD PRESSURE 90 tablet 2   Ascorbic Acid (VITAMIN C) 1000 MG tablet Take 1,000 mg by mouth daily.     aspirin EC 81 MG tablet Take 162 mg by mouth daily.     atorvastatin (LIPITOR) 80 MG tablet Take 1 tablet (80 mg total) by mouth daily. for cholesterol. 90 tablet 3   escitalopram (LEXAPRO) 10 MG tablet Take 1 tablet (10 mg total) by mouth daily. for anxiety and depression. 90 tablet 0   famotidine (PEPCID) 20 MG tablet Take 20 mg by mouth daily as needed for heartburn or indigestion.  ferrous gluconate (FERGON) 324 MG tablet TAKE 1 TABLET BY MOUTH DAILY WITH BREAKFAST 90 tablet 3   fluticasone-salmeterol (WIXELA INHUB) 250-50 MCG/ACT AEPB INHALE 1 PUFF INTO THE LUNGS IN THE MORNING AND AT BEDTIME. 180 each 0   lisinopril (ZESTRIL) 30 MG tablet TAKE 1 TABLET (30 MG TOTAL) BY MOUTH DAILY. FOR BLOOD PRESSURE. 90 tablet 2   pyridOXINE (VITAMIN B-6) 25 MG tablet Take 25 mg by mouth daily.     SUMAtriptan (IMITREX) 50 MG tablet Take 1 tablet at migraine onset. May repeat in 2 hours if headache persists or recurs. 10 tablet 0   vitamin B-12 (CYANOCOBALAMIN) 1000 MCG tablet Take 1,000 mcg by mouth daily.     vitamin E 180 MG (400 UNITS) capsule Take 800 Units by mouth daily.     No current facility-administered medications on file prior to visit.    BP (!) 140/82 (BP Location: Left Arm, Patient Position: Sitting, Cuff Size: Normal)   Pulse (!) 53   Temp 98.1 F (36.7 C) (Oral)   Ht 5\' 3"  (1.6 m)   Wt 188 lb (85.3 kg)   SpO2 98%   BMI 33.30 kg/m  Objective:   Physical Exam Cardiovascular:     Rate and Rhythm: Normal rate and regular rhythm.  Pulmonary:     Effort: Pulmonary  effort is normal.     Breath sounds: Normal breath sounds.  Musculoskeletal:     Cervical back: Neck supple.  Skin:    General: Skin is warm and dry.  Neurological:     Mental Status: She is alert and oriented to person, place, and time.  Psychiatric:     Comments: Appears anxious during exam           Assessment & Plan:  Carotid atherosclerosis, bilateral Assessment & Plan: Following with vascular surgery, reviewed office notes from November 2024.  Continue atorvastatin 80 mg daily and aspirin 81 mg daily. Agree to extend FMLA with tentitative return to work date of March 3rd, 2025.    Anxiety and depression Assessment & Plan: Improving, but agree that she is not ready to return to work.  Continue Lexapro 10 mg daily.  Continue to monitor.   Agree to extend FMLA with tentitative return to work date of March 3rd, 2025. Forms completed.    Primary hypertension Assessment & Plan: Above goal today, improved on recheck.  Strongly advised that she start checking blood pressure at home and call us if readings consistently remain at or above 140/90. We discussed the impacts of high blood pressure on her current medical conditions.  Continue lisinopril 30 mg daily and amlodipine 10 mg daily for now.         Doreene Nest, NP

## 2023-07-12 NOTE — Patient Instructions (Addendum)
Continue taking Lexapro 10 mg daily for anxiety.  We will complete your disability forms with a tentative return to work date of October 07, 2023.  Follow-up with cardiology as scheduled.  Start monitoring your blood pressure daily, around the same time of day, for the next 1 week.  Ensure that you have rested for 30 minutes prior to checking your blood pressure.   Record your readings and notify me if you see numbers consistently at or above 140 on top and/or 90 on bottom.   It was a pleasure to see you today!

## 2023-07-12 NOTE — Assessment & Plan Note (Addendum)
Following with vascular surgery, reviewed office notes from November 2024.  Continue atorvastatin 80 mg daily and aspirin 81 mg daily. Agree to extend FMLA with tentitative return to work date of March 3rd, 2025.

## 2023-07-12 NOTE — Assessment & Plan Note (Addendum)
Improving, but agree that she is not ready to return to work.  Continue Lexapro 10 mg daily.  Continue to monitor.   Agree to extend FMLA with tentitative return to work date of March 3rd, 2025. Forms completed.

## 2023-07-12 NOTE — Assessment & Plan Note (Signed)
Above goal today, improved on recheck.  Strongly advised that she start checking blood pressure at home and call us if readings consistently remain at or above 140/90. We discussed the impacts of high blood pressure on her current medical conditions.  Continue lisinopril 30 mg daily and amlodipine 10 mg daily for now.

## 2023-08-14 ENCOUNTER — Other Ambulatory Visit: Payer: Self-pay | Admitting: Primary Care

## 2023-08-14 DIAGNOSIS — F419 Anxiety disorder, unspecified: Secondary | ICD-10-CM

## 2023-08-18 ENCOUNTER — Other Ambulatory Visit: Payer: Self-pay | Admitting: Primary Care

## 2023-08-18 DIAGNOSIS — J449 Chronic obstructive pulmonary disease, unspecified: Secondary | ICD-10-CM

## 2023-09-01 NOTE — Progress Notes (Signed)
  Cardiology Office Note:  .   Date:  09/06/2023  ID:  Denise [name], DOB 07-19-1961, MRN [id number] PCP: Doreene Nest, NP  [facility] Providers Cardiologist:  Kristeen Miss, MD    History of Present Illness: .   Denise [name] is a 63 y.o. female with hx of HTN, HLD,  carotid artery disease,  obesity, cigarette smoking   She is seen today for pre op evaluation  Is scheduled for a carotid enterectomy   Her CAC score is 526 ( 98th percentile for age / sex matched controls )   Has occasional CP , very mild  Sometimes exertional ,  Is able to climb 1-2 flights of stairs without  CP  She is pretty sure she would be short of breath   Also has them with viral illness   Tries to exercise  -  walks 3000-5000 steps a day .   She works from home since [employer] smoking in Oct. , 2024  LDL looks good 57    ROS:   Studies Reviewed: Marland Kitchen   EKG Interpretation Date/Time:  Friday September 06 2023 14:49:40 EST Ventricular Rate:  100 PR Interval:  148 QRS Duration:  74 QT Interval:  342 QTC Calculation: 441 R Axis:   70  Text Interpretation: Sinus rhythm with occasional Premature ventricular complexes Nonspecific ST abnormality When compared with ECG of 22-Dec-2019 17:57, Premature ventricular complexes are now Present No significant change since last tracing Confirmed by Kristeen Miss ([id number]) on 09/06/2023 2:55:00 PM     Risk Assessment/Calculations:             Physical Exam:   VS:  BP 122/68   Pulse 100   Ht 5\' 4"  (1.626 m)   Wt 176 lb 9.6 oz (80.1 kg)   SpO2 97%   BMI 30.31 kg/m    Wt Readings from Last 3 Encounters:  09/06/23 176 lb 9.6 oz (80.1 kg)  07/12/23 188 lb (85.3 kg)  07/02/23 188 lb 11.2 oz (85.6 kg)    GEN: Well nourished, well developed in no acute distress NECK: No JVD; bilat carotid bruits  CARDIAC:  soft systolic murmur  RESPIRATORY:  Clear to auscultation without rales, wheezing or rhonchi  ABDOMEN: Soft, non-tender,  non-distended EXTREMITIES:  No edema; No deformity   ASSESSMENT AND PLAN: .   1.  Carotid artery disease: She has severe bilateral carotid artery stenosis.  She needs to have carotid endarterectomy.  She is here for preoperative evaluation.  She has known coronary artery calcifications.  She has a long history of cigarette smoking but stopped several months ago.  I like to proceed with coronary CT angiogram for further evaluation of her coronary status.  If she does not have severe obstructive disease by coronary CTA then she should be at low to moderate risk for her upcoming carotid endarterectomy.  She also has a soft murmur.  Will get a repeat echocardiogram to ensure that her LV function and valvular function are stable relative to her previous echocardiogram in 2021.  Will have her follow-up with [place] in 3 months.  I will be giving her       Dispo:   Signed, Kristeen Miss, MD

## 2023-09-06 ENCOUNTER — Ambulatory Visit: Payer: Managed Care, Other (non HMO) | Attending: Cardiovascular Disease | Admitting: Cardiovascular Disease

## 2023-09-06 ENCOUNTER — Encounter: Payer: Self-pay | Admitting: Cardiovascular Disease

## 2023-09-06 VITALS — BP 122/68 | HR 100 | Ht 64.0 in | Wt 176.6 lb

## 2023-09-06 DIAGNOSIS — E785 Hyperlipidemia, unspecified: Secondary | ICD-10-CM | POA: Diagnosis not present

## 2023-09-06 DIAGNOSIS — I10 Essential (primary) hypertension: Secondary | ICD-10-CM | POA: Diagnosis not present

## 2023-09-06 DIAGNOSIS — I6523 Occlusion and stenosis of bilateral carotid arteries: Secondary | ICD-10-CM | POA: Diagnosis not present

## 2023-09-06 DIAGNOSIS — I2510 Atherosclerotic heart disease of native coronary artery without angina pectoris: Secondary | ICD-10-CM | POA: Diagnosis not present

## 2023-09-06 DIAGNOSIS — Z01812 Encounter for preprocedural laboratory examination: Secondary | ICD-10-CM

## 2023-09-06 DIAGNOSIS — R079 Chest pain, unspecified: Secondary | ICD-10-CM

## 2023-09-06 MED ORDER — METOPROLOL TARTRATE 100 MG PO TABS: 100.0000 mg | ORAL_TABLET | Freq: Once | ORAL | 0 refills | Status: DC

## 2023-09-06 NOTE — Patient Instructions (Addendum)
Lab Work: Today: BMET If you have labs (blood work) drawn today and your tests are completely normal, you will receive your results only by: [employer] (if you have MyChart) OR A paper copy in the mail If you have any lab test that is abnormal or we need to change your treatment, we will call you to review the results.   Testing/Procedures: Echo Your physician has requested that you have an echocardiogram. Echocardiography is a painless test that uses sound waves to create images of your heart. It provides your doctor with information about the size and shape of your heart and how well your heart's chambers and valves are working. This procedure takes approximately one hour. There are no restrictions for this procedure. Please do NOT wear cologne, perfume, aftershave, or lotions (deodorant is allowed). Please arrive 15 minutes prior to your appointment time.  Please note: We ask at that you not bring children with you during ultrasound (echo/ vascular) testing. Due to room size and safety concerns, children are not allowed in the ultrasound rooms during exams. Our front office staff cannot provide observation of children in our lobby area while testing is being conducted. An adult accompanying a patient to their appointment will only be allowed in the ultrasound room at the discretion of the ultrasound technician under special circumstances. We apologize for any inconvenience.   Coronary CTA Your physician has requested that you have cardiac CT. Cardiac computed tomography (CT) is a painless test that uses an x-ray machine to take clear, detailed pictures of your heart. For further information please visit [email or web address] Please follow instruction sheet as given.   Follow-Up: At [facility], you and your health needs are our priority.  As part of our continuing mission to provide you with exceptional heart care, we have created designated Provider Care Teams.  These Care  Teams include your primary Cardiologist (physician) and Advanced Practice Providers (APPs -  Physician Assistants and Nurse Practitioners) who all work together to provide you with the care you need, when you need it.  We recommend signing up for the patient portal called "MyChart".  Sign up information is provided on this After Visit Summary.  MyChart is used to connect with patients for Virtual Visits (Telemedicine).  Patients are able to view lab/test results, encounter notes, upcoming appointments, etc.  Non-urgent messages can be sent to your provider as well.   To learn more about what you can do with MyChart, go to [email or web address].    Your next appointment:   3 month(s)  Provider:   Kristeen Miss, MD  or APP   Other Instructions   Your cardiac CT will be scheduled at one of the below locations:   [facility] [address] [place], [place] [address] [phone]   If scheduled at [facility], please arrive at the [facility] and Children's Entrance (Entrance C2) of [facility] 30 minutes prior to test start time. You can use the FREE valet parking offered at entrance C (encouraged to control the heart rate for the test)  Proceed to the [facility] Radiology Department (first floor) to check-in and test prep.  All radiology patients and guests should use entrance C2 at [facility], accessed from [facility], even though the hospital's physical address listed is [address].     If scheduled at [facility], please arrive 30 minutes early for check-in and test prep.  Please follow these instructions carefully (unless otherwise  directed):  An IV will be required for this test and Nitroglycerin will be given.  Hold all erectile dysfunction medications at least 3 days (72 hrs) prior to test. (Ie viagra, cialis, sildenafil, tadalafil, etc)   On the Night Before the Test: Be sure to Drink plenty of  water. Do not consume any caffeinated/decaffeinated beverages or chocolate 12 hours prior to your test. Do not take any antihistamines 12 hours prior to your test.  On the Day of the Test: Drink plenty of water until 1 hour prior to the test. Do not eat any food 1 hour prior to test. You may take your regular medications prior to the test.  Take metoprolol (Lopressor) two hours prior to test. Patients who wear a continuous glucose monitor MUST remove the device prior to scanning. FEMALES- please wear underwire-free bra if available, avoid dresses & tight clothing      After the Test: Drink plenty of water. After receiving IV contrast, you may experience a mild flushed feeling. This is normal. On occasion, you may experience a mild rash up to 24 hours after the test. This is not dangerous. If this occurs, you can take Benadryl 25 mg, Zyrtec, Claritin, or Allegra and increase your fluid intake. (Patients taking Tikosyn should avoid Benadryl, and may take Zyrtec, Claritin, or Allegra) If you experience trouble breathing, this can be serious. If it is severe call 911 IMMEDIATELY. If it is mild, please call our office.  We will call to schedule your test 2-4 weeks out understanding that some insurance companies will need an authorization prior to the service being performed.   For more information and frequently asked questions, please visit our website : [email or web address]  For non-scheduling related questions, please contact the cardiac imaging nurse navigator should you have any questions/concerns: Cardiac Imaging Nurse Navigators Direct Office Dial: [phone]   For scheduling needs, including cancellations and rescheduling, please call [place], [phone].     1st Floor: - Lobby - Registration  - Pharmacy  - Lab - Cafe  2nd Floor: - PV Lab - Diagnostic Testing (echo, CT, nuclear med)  3rd Floor: - Vacant  4th Floor: - TCTS (cardiothoracic surgery) -  AFib Clinic - Structural Heart Clinic - Vascular Surgery  - Vascular Ultrasound  5th Floor: - HeartCare Cardiology (general and EP) - Clinical Pharmacy for coumadin, hypertension, lipid, weight-loss medications, and med management appointments    Valet parking services will be available as well.

## 2023-09-07 LAB — BASIC METABOLIC PANEL
BUN/Creatinine Ratio: 15 (ref 12–28)
BUN: 11 mg/dL (ref 8–27)
CO2: 20 mmol/L (ref 20–29)
Calcium: 9.6 mg/dL (ref 8.7–10.3)
Chloride: 93 mmol/L — ABNORMAL LOW (ref 96–106)
Creatinine, Ser: 0.75 mg/dL (ref 0.57–1.00)
Glucose: 106 mg/dL — ABNORMAL HIGH (ref 70–99)
Potassium: 4.4 mmol/L (ref 3.5–5.2)
Sodium: 132 mmol/L — ABNORMAL LOW (ref 134–144)
eGFR: 90 mL/min/{1.73_m2} (ref >59)

## 2023-09-09 ENCOUNTER — Encounter: Payer: Self-pay | Admitting: Cardiovascular Disease

## 2023-09-16 NOTE — Telephone Encounter (Signed)
 Kelli, do you know what happened here?  I see the empty scanned form from [place]  [employer] in the media section, but I do not see a completed version.  Did this not come across our fax?   If not, can you print and place on my desk?

## 2023-09-17 NOTE — Telephone Encounter (Signed)
Forms completed and placed in Kelli's inbox.

## 2023-09-17 NOTE — Telephone Encounter (Signed)
Forms faxed and received confirmation

## 2023-09-17 NOTE — Telephone Encounter (Signed)
I do not recall ever seeing this form to print from my [employer].  Printed and placed on your desk.

## 2023-09-18 ENCOUNTER — Encounter: Payer: Self-pay | Admitting: Primary Care

## 2023-09-18 ENCOUNTER — Ambulatory Visit (INDEPENDENT_AMBULATORY_CARE_PROVIDER_SITE_OTHER): Payer: Managed Care, Other (non HMO) | Admitting: Primary Care

## 2023-09-18 VITALS — BP 134/80 | HR 105 | Temp 98.2°F | Ht 64.0 in | Wt 177.0 lb

## 2023-09-18 DIAGNOSIS — H539 Unspecified visual disturbance: Secondary | ICD-10-CM

## 2023-09-18 DIAGNOSIS — I6523 Occlusion and stenosis of bilateral carotid arteries: Secondary | ICD-10-CM

## 2023-09-18 DIAGNOSIS — F32A Depression, unspecified: Secondary | ICD-10-CM | POA: Diagnosis not present

## 2023-09-18 DIAGNOSIS — F419 Anxiety disorder, unspecified: Secondary | ICD-10-CM

## 2023-09-18 MED ORDER — ESCITALOPRAM OXALATE 20 MG PO TABS: 20.0000 mg | ORAL_TABLET | Freq: Every day | ORAL | 0 refills | Status: DC

## 2023-09-18 NOTE — Patient Instructions (Signed)
We increased your dose of Lexapro to 20 mg daily.  I sent a new prescription to your pharmacy.  Please schedule a physical to meet with me in late May.  Please have your HR department send me any paperwork.  It was a pleasure to see you today!

## 2023-09-18 NOTE — Progress Notes (Signed)
Subjective:    Patient ID: Denise [name], female    DOB: [birth year], 63 y.o.   MRN: [id number]  HPI  Denise [name] is a very pleasant 63 y.o. female with a history of hypertension, cardiomegaly, CAD, carotid atherosclerosis, COPD, lung nodule, GAD, MDD, iron deficiency anemia, adnexal mass who presents today to discuss short term disability.   Currently on short term disability, scheduled to return to work on 10/07/23. She does not believe she can return to work as scheduled due to upcoming testing and surgery, limited sight.   Since her last visit she was evaluated by cardiology on 09/06/2023 for preop evaluation for for severe bilateral carotid artery stenosis and is pending carotid endarterectomy.  The plan is to repeat her CT coronary calcium score and echocardiogram prior to surgical clearance.  She has been scheduled on 09/24/2023 and 09/25/2023. Her surgery has yet to be scheduled pending cardiology clearance. She will require two separate surgeries which require 1 day stay in the hospital. She's not sure how close the surgeries will be.   She continues to have limited vision, little depth perception, cannot read or construct an email, could not work on the operating system for work, cannot read numbers or words on a computer screen. She has a large grey "blotch" to her right eye. She is able to see long distances. Following with ophthalmology.   She continues to experience ongoing anxiety, is dependant on her children to help her. Lexapro has helped some, but she questions if she needs a dose increase given her upcoming testing and surgery. Symptoms include worrying, over thinking things, feeling overwhelmed.   She is requesting to finish short term disability which should be in late March 2025, then transition into long term disability. She does not know when she can return to work.   BP Readings from Last 3 Encounters:  09/18/23 134/80  09/06/23 122/68  07/12/23 (!) 140/82      Review of Systems  Eyes:  Positive for visual disturbance.  Respiratory:  Positive for shortness of breath.   Cardiovascular:  Positive for chest pain.  Psychiatric/Behavioral:  The patient is nervous/anxious.          Past Medical History:  Diagnosis Date   Allergy    BMI 35.0-35.9,adult 03/31/2012   Chickenpox    Cholecystitis with cholelithiasis 03/31/2012   Heart murmur    Hypertension 03/31/2012   Shingles     Social History   Socioeconomic History   Marital status: Widowed    Spouse name: Not on file   Number of children: Not on file   Years of education: Not on file   Highest education level: Associate degree: occupational, Scientist, product/process development, or vocational program  Occupational History   Not on file  Tobacco Use   Smoking status: Former    Current packs/day: 0.00    Average packs/day: 0.8 packs/day for 34.0 years (25.5 ttl pk-yrs)    Types: Cigarettes    Start date: 05/06/1985    Quit date: 05/07/2019    Years since quitting: 4.3   Smokeless tobacco: Never  Vaping Use   Vaping status: Never Used  Substance and Sexual Activity   Alcohol use: No   Drug use: No   Sexual activity: Never    Birth control/protection: None, Post-menopausal  Other Topics Concern   Not on file  Social History Narrative   Widow.    2 children.   Works in Clinical biochemist.   Enjoys reading, cooking.  Social Drivers of Corporate investment banker Strain: Low Risk  (04/15/2023)   Overall Financial Resource Strain (CARDIA)    Difficulty of Paying Living Expenses: Not very hard  Food Insecurity: No Food Insecurity (04/15/2023)   Hunger Vital Sign    Worried About Running Out of Food in the Last Year: Never true    Ran Out of Food in the Last Year: Never true  Transportation Needs: No Transportation Needs (04/15/2023)   PRAPARE - Administrator, Civil Service (Medical): No    Lack of Transportation (Non-Medical): No  Physical Activity: Insufficiently Active (04/15/2023)    Exercise Vital Sign    Days of Exercise per Week: 3 days    Minutes of Exercise per Session: 30 min  Stress: Stress Concern Present (04/15/2023)   [name] of Occupational Health - Occupational Stress Questionnaire    Feeling of Stress : Very much  Social Connections: Unknown (04/15/2023)   Social Connection and Isolation Panel [NHANES]    Frequency of Communication with Friends and Family: Three times a week    Frequency of Social Gatherings with Friends and Family: Patient declined    Attends Religious Services: Patient declined    Active Member of Clubs or Organizations: No    Attends Engineer, structural: Not on file    Marital Status: Widowed  Intimate Partner Violence: Not on file    Past Surgical History:  Procedure Laterality Date   BIOPSY  12/24/2019   Procedure: BIOPSY;  Surgeon: Rachael Fee, MD;  Location: [facility] ENDOSCOPY;  Service: Endoscopy;;   CHOLECYSTECTOMY  03/30/2012   Procedure: LAPAROSCOPIC CHOLECYSTECTOMY WITH INTRAOPERATIVE CHOLANGIOGRAM;  Surgeon: Mariella Saa, MD;  Location: [facility] ORS;  Service: General;  Laterality: N/A;   COLONOSCOPY WITH PROPOFOL N/A 12/24/2019   Procedure: COLONOSCOPY WITH PROPOFOL;  Surgeon: Rachael Fee, MD;  Location: [facility] ENDOSCOPY;  Service: Endoscopy;  Laterality: N/A;   ESOPHAGOGASTRODUODENOSCOPY (EGD) WITH PROPOFOL N/A 12/24/2019   Procedure: ESOPHAGOGASTRODUODENOSCOPY (EGD) WITH PROPOFOL;  Surgeon: Rachael Fee, MD;  Location: [facility] ENDOSCOPY;  Service: Endoscopy;  Laterality: N/A;   HOT HEMOSTASIS N/A 12/24/2019   Procedure: HOT HEMOSTASIS (ARGON PLASMA COAGULATION/BICAP);  Surgeon: Rachael Fee, MD;  Location: [facility] ENDOSCOPY;  Service: Endoscopy;  Laterality: N/A;   POLYPECTOMY  12/24/2019   Procedure: POLYPECTOMY;  Surgeon: Rachael Fee, MD;  Location: [facility] ENDOSCOPY;  Service: Endoscopy;;   TUBAL LIGATION      Family History  Problem Relation Age of Onset   Arthritis Mother    Cervical cancer Mother     Heart disease Father    Hypertension Father     Allergies  Allergen Reactions   Codeine Itching    Current Outpatient Medications on File Prior to Visit  Medication Sig Dispense Refill   albuterol (VENTOLIN HFA) 108 (90 Base) MCG/ACT inhaler INHALE 2 PUFFS INTO THE LUNGS EVERY 4-6 HOURS AS NEEDED FOR TIGHTNESS/WHEEZING 8.5 each 0   amLODipine (NORVASC) 10 MG tablet TAKE 1 TABLET BY MOUTH EVERY DAY FOR BLOOD PRESSURE 90 tablet 2   Ascorbic Acid (VITAMIN C) 1000 MG tablet Take 1,000 mg by mouth daily.     aspirin EC 81 MG tablet Take 162 mg by mouth daily.     atorvastatin (LIPITOR) 80 MG tablet Take 1 tablet (80 mg total) by mouth daily. for cholesterol. 90 tablet 3   famotidine (PEPCID) 20 MG tablet Take 20 mg by mouth daily as needed for heartburn or indigestion.  ferrous gluconate (FERGON) 324 MG tablet TAKE 1 TABLET BY MOUTH DAILY WITH BREAKFAST 90 tablet 3   fluticasone-salmeterol (WIXELA INHUB) 250-50 MCG/ACT AEPB INHALE 1 PUFF INTO THE LUNGS IN THE MORNING AND AT BEDTIME. 180 each 1   lisinopril (ZESTRIL) 30 MG tablet TAKE 1 TABLET (30 MG TOTAL) BY MOUTH DAILY. FOR BLOOD PRESSURE. 90 tablet 2   pyridOXINE (VITAMIN B-6) 25 MG tablet Take 25 mg by mouth daily.     SUMAtriptan (IMITREX) 50 MG tablet Take 1 tablet at migraine onset. May repeat in 2 hours if headache persists or recurs. 10 tablet 0   vitamin B-12 (CYANOCOBALAMIN) 1000 MCG tablet Take 1,000 mcg by mouth daily.     vitamin E 180 MG (400 UNITS) capsule Take 800 Units by mouth daily.     metoprolol tartrate (LOPRESSOR) 100 MG tablet Take 1 tablet (100 mg total) by mouth once for 1 dose. Take 90-120 minutes prior to scan. Hold for SBP less than 110. 1 tablet 0   No current facility-administered medications on file prior to visit.    BP 134/80   Pulse (!) 105   Temp 98.2 F (36.8 C) (Temporal)   Ht 5\' 4"  (1.626 m)   Wt 177 lb (80.3 kg)   SpO2 98%   BMI 30.38 kg/m  Objective:   Physical Exam Cardiovascular:      Rate and Rhythm: Normal rate and regular rhythm.  Pulmonary:     Effort: Pulmonary effort is normal.     Breath sounds: Normal breath sounds.  Musculoskeletal:     Cervical back: Neck supple.  Skin:    General: Skin is warm and dry.  Neurological:     Mental Status: She is alert and oriented to person, place, and time.  Psychiatric:     Comments: Tearful during exam, appears anxious            Assessment & Plan:  Carotid atherosclerosis, bilateral Assessment & Plan: Pending bilateral carotid endarterectomy, two separate surgeries.  Pending cardiology clearance.   Continue atorvastatin 80 mg daily, aspirin 81 mg daily.  Agree to extend short term disability through the end with plans to transition to long term disability. She will have HR send updated paperwork.    Anxiety and depression Assessment & Plan: Deteriorated.  Increase Lexapro to 20 mg daily.  Agree to extend short term disability through the end with plans to transition to long term disability. She will have HR send updated paperwork.   Orders: -     Escitalopram Oxalate; Take 1 tablet (20 mg total) by mouth daily. For anxiety  Dispense: 90 tablet; Refill: 0  Visual disturbance Assessment & Plan: Following with ophthalmology. Unclear etiology at this point.  Proceed with bilateral carotid endarterectomy.  Agree to extend short term disability through the end with plans to transition to long term disability. She will have HR send updated paperwork.          Doreene Nest, NP

## 2023-09-18 NOTE — Assessment & Plan Note (Addendum)
Deteriorated.  Increase Lexapro to 20 mg daily.  Agree to extend short term disability through the end with plans to transition to long term disability. She will have HR send updated paperwork.

## 2023-09-18 NOTE — Assessment & Plan Note (Addendum)
Pending bilateral carotid endarterectomy, two separate surgeries.  Pending cardiology clearance.   Continue atorvastatin 80 mg daily, aspirin 81 mg daily.  Agree to extend short term disability through the end with plans to transition to long term disability. She will have HR send updated paperwork.

## 2023-09-18 NOTE — Assessment & Plan Note (Signed)
Following with ophthalmology. Unclear etiology at this point.  Proceed with bilateral carotid endarterectomy.  Agree to extend short term disability through the end with plans to transition to long term disability. She will have HR send updated paperwork.

## 2023-09-23 ENCOUNTER — Telehealth: Payer: Self-pay

## 2023-09-23 NOTE — Telephone Encounter (Signed)
Copied from CRM [id number]. Topic: General - Other >> Sep 20, 2023  4:18 PM Sim Boast F wrote: Reason for CRM: Raiford Noble from [place] [employer] request phone call back regarding patients disability claim needs clarifications on restrictions, etc. Please call back [phone] ext [phone]

## 2023-09-23 NOTE — Telephone Encounter (Signed)
Can we find out what is needed? Additional forms?

## 2023-09-23 NOTE — Telephone Encounter (Signed)
Attempted to reach out to [place] from [place] [employer]. Office is currently closed for Presidents day.

## 2023-09-24 ENCOUNTER — Ambulatory Visit (HOSPITAL_COMMUNITY): Payer: Managed Care, Other (non HMO)

## 2023-09-24 NOTE — Telephone Encounter (Signed)
Attempted to call Raiford Noble with [place] disability. Unable to leave voicemail due to phone being cut off before it allows voicemail to be recorded.

## 2023-09-25 ENCOUNTER — Ambulatory Visit (HOSPITAL_COMMUNITY): Admission: RE | Admit: 2023-09-25 | Payer: Managed Care, Other (non HMO) | Source: Ambulatory Visit

## 2023-09-25 NOTE — Telephone Encounter (Signed)
Left voicemail with Raiford Noble from [place] [employer] to have him return call to office.

## 2023-09-27 NOTE — Telephone Encounter (Signed)
Received fax requesting additional clarification on patient STD restrictions. PPW placed in Kates inbox for review and completion.

## 2023-09-27 NOTE — Telephone Encounter (Signed)
 Completed and placed in Kelli's inbox.

## 2023-09-30 NOTE — Telephone Encounter (Signed)
 PPW faxed to [facility]

## 2023-10-02 ENCOUNTER — Ambulatory Visit: Payer: Managed Care, Other (non HMO) | Admitting: Physician Assistant

## 2023-10-09 ENCOUNTER — Encounter (HOSPITAL_COMMUNITY): Payer: Self-pay

## 2023-10-10 ENCOUNTER — Telehealth (HOSPITAL_COMMUNITY): Payer: Self-pay | Admitting: *Deleted

## 2023-10-10 NOTE — Telephone Encounter (Signed)
 Reaching out to patient to offer assistance regarding upcoming cardiac imaging study; pt verbalizes understanding of appt date/time, parking situation and where to check in, pre-test NPO status and medications ordered, and verified current allergies; name and call back number provided for further questions should they arise Denise Frame RN Navigator Cardiac Imaging [facility] [phone] office [phone] cell

## 2023-10-11 ENCOUNTER — Ambulatory Visit (HOSPITAL_COMMUNITY)
Admission: RE | Admit: 2023-10-11 | Discharge: 2023-10-11 | Disposition: A | Discharge status: Home / Self Care | Payer: Managed Care, Other (non HMO) | Source: Ambulatory Visit | Attending: Cardiovascular Disease | Admitting: Cardiovascular Disease

## 2023-10-11 DIAGNOSIS — I2510 Atherosclerotic heart disease of native coronary artery without angina pectoris: Secondary | ICD-10-CM | POA: Diagnosis not present

## 2023-10-11 DIAGNOSIS — R931 Abnormal findings on diagnostic imaging of heart and coronary circulation: Secondary | ICD-10-CM | POA: Insufficient documentation

## 2023-10-11 DIAGNOSIS — R079 Chest pain, unspecified: Secondary | ICD-10-CM | POA: Insufficient documentation

## 2023-10-11 MED ORDER — DILTIAZEM HCL 25 MG/5ML IV SOLN: 10.0000 mg | INTRAVENOUS | Status: DC | PRN

## 2023-10-11 MED ORDER — NITROGLYCERIN 0.4 MG SL SUBL
SUBLINGUAL_TABLET | SUBLINGUAL | Status: AC
  Filled 2023-10-11: qty 2

## 2023-10-11 MED ORDER — METOPROLOL TARTRATE 5 MG/5ML IV SOLN
10.0000 mg | Freq: Once | INTRAVENOUS | Status: AC | PRN
  Administered 2023-10-11: 5 mg via INTRAVENOUS

## 2023-10-11 MED ORDER — METOPROLOL TARTRATE 5 MG/5ML IV SOLN
INTRAVENOUS | Status: AC
  Filled 2023-10-11: qty 10

## 2023-10-11 MED ORDER — NITROGLYCERIN 0.4 MG SL SUBL
0.8000 mg | SUBLINGUAL_TABLET | Freq: Once | SUBLINGUAL | Status: AC
  Administered 2023-10-11: 0.8 mg via SUBLINGUAL

## 2023-10-11 MED ORDER — IOHEXOL 350 MG/ML SOLN
95.0000 mL | Freq: Once | INTRAVENOUS | Status: AC | PRN
  Administered 2023-10-11: 95 mL via INTRAVENOUS

## 2023-10-13 ENCOUNTER — Ambulatory Visit (HOSPITAL_BASED_OUTPATIENT_CLINIC_OR_DEPARTMENT_OTHER)
Admission: RE | Admit: 2023-10-13 | Discharge: 2023-10-13 | Disposition: A | Discharge status: Home / Self Care | Source: Ambulatory Visit | Attending: Cardiovascular Disease | Admitting: Cardiovascular Disease

## 2023-10-13 ENCOUNTER — Other Ambulatory Visit: Payer: Self-pay | Admitting: Cardiovascular Disease

## 2023-10-13 DIAGNOSIS — R931 Abnormal findings on diagnostic imaging of heart and coronary circulation: Secondary | ICD-10-CM

## 2023-10-13 NOTE — Progress Notes (Signed)
 CT FFR ordered.  Gerri Spore T. Flora Lipps, MD, [facility]  [facility]  [address], [address] [place], [place] [address] [phone]  7:27 PM

## 2023-10-15 ENCOUNTER — Encounter (HOSPITAL_BASED_OUTPATIENT_CLINIC_OR_DEPARTMENT_OTHER): Payer: Self-pay

## 2023-10-15 ENCOUNTER — Ambulatory Visit (HOSPITAL_COMMUNITY): Payer: Managed Care, Other (non HMO) | Attending: Cardiology

## 2023-10-15 DIAGNOSIS — R079 Chest pain, unspecified: Secondary | ICD-10-CM | POA: Insufficient documentation

## 2023-10-15 LAB — ECHOCARDIOGRAM COMPLETE
AR max vel: 1.09 cm2
AV Area VTI: 1.21 cm2
AV Area mean vel: 1.16 cm2
AV Mean grad: 25.7 mmHg
AV Peak grad: 50.1 mmHg
Ao pk vel: 3.54 m/s
Area-P 1/2: 2.31 cm2
P 1/2 time: 404 ms
S' Lateral: 2.8 cm

## 2023-10-16 ENCOUNTER — Encounter: Payer: Self-pay | Admitting: Cardiovascular Disease

## 2023-10-18 ENCOUNTER — Telehealth: Payer: Self-pay

## 2023-10-18 NOTE — Telephone Encounter (Signed)
 Attempted to call for surgery scheduling. LVM

## 2023-10-21 ENCOUNTER — Other Ambulatory Visit: Payer: Self-pay

## 2023-10-21 DIAGNOSIS — I6523 Occlusion and stenosis of bilateral carotid arteries: Secondary | ICD-10-CM

## 2023-10-23 ENCOUNTER — Other Ambulatory Visit: Payer: Self-pay

## 2023-10-23 NOTE — Pre-Procedure Instructions (Signed)
 Surgical Instructions   Your procedure is scheduled on November 01, 2023. Report to [facility] Main Entrance "A" at 7:30 A.M., then check in with the Admitting office. Any questions or running late day of surgery: call [phone]  Questions prior to your surgery date: call [phone], Monday-Friday, 8am-4pm. If you experience any cold or flu symptoms such as cough, fever, chills, shortness of breath, etc. between now and your scheduled surgery, please notify us at the above number.     Remember:  Do not eat or drink after midnight the night before your surgery    Take these medicines the morning of surgery with A SIP OF WATER: amLODipine (NORVASC)  aspirin  atorvastatin (LIPITOR)  escitalopram (LEXAPRO)  fluticasone-salmeterol (WIXELA INHUB) inhaler   May take these medicines IF NEEDED: albuterol (VENTOLIN HFA) inhaler - please bring inhaler with you morning of surgery diphenhydrAMINE (BENADRYL)  famotidine (PEPCID)    One week prior to surgery, STOP taking any Aleve, Naproxen, Ibuprofen, Motrin, Advil, Goody's, BC's, all herbal medications, fish oil, and non-prescription vitamins.                     Do NOT Smoke (Tobacco/Vaping) for 24 hours prior to your procedure.  If you use a CPAP at night, you may bring your mask/headgear for your overnight stay.   You will be asked to remove any contacts, glasses, piercing's, hearing aid's, dentures/partials prior to surgery. Please bring cases for these items if needed.    Patients discharged the day of surgery will not be allowed to drive home, and someone needs to stay with them for 24 hours.  SURGICAL WAITING ROOM VISITATION Patients may have no more than 2 support people in the waiting area - these visitors may rotate.   Pre-op nurse will coordinate an appropriate time for 1 ADULT support person, who may not rotate, to accompany patient in pre-op.  Children under the age of 32 must have an adult with them who is not the  patient and must remain in the main waiting area with an adult.  If the patient needs to stay at the hospital during part of their recovery, the visitor guidelines for inpatient rooms apply.  Please refer to the [facility] website for the visitor guidelines for any additional information.   If you received a COVID test during your pre-op visit  it is requested that you wear a mask when out in public, stay away from anyone that may not be feeling well and notify your surgeon if you develop symptoms. If you have been in contact with anyone that has tested positive in the last 10 days please notify you surgeon.      Pre-operative CHG Bathing Instructions   You can play a key role in reducing the risk of infection after surgery. Your skin needs to be as free of germs as possible. You can reduce the number of germs on your skin by washing with CHG (chlorhexidine gluconate) soap before surgery. CHG is an antiseptic soap that kills germs and continues to kill germs even after washing.   DO NOT use if you have an allergy to chlorhexidine/CHG or antibacterial soaps. If your skin becomes reddened or irritated, stop using the CHG and notify one of our RNs at [phone].              TAKE A SHOWER THE NIGHT BEFORE SURGERY AND THE DAY OF SURGERY    Please keep in mind the following:  DO NOT  shave, including legs and underarms, 48 hours prior to surgery.   You may shave your face before/day of surgery.  Place clean sheets on your bed the night before surgery Use a clean washcloth (not used since being washed) for each shower. DO NOT sleep with pet's night before surgery.  CHG Shower Instructions:  Wash your face and private area with normal soap. If you choose to wash your hair, wash first with your normal shampoo.  After you use shampoo/soap, rinse your hair and body thoroughly to remove shampoo/soap residue.  Turn the water OFF and apply half the bottle of CHG soap to a CLEAN washcloth.  Apply  CHG soap ONLY FROM YOUR NECK DOWN TO YOUR TOES (washing for 3-5 minutes)  DO NOT use CHG soap on face, private areas, open wounds, or sores.  Pay special attention to the area where your surgery is being performed.  If you are having back surgery, having someone wash your back for you may be helpful. Wait 2 minutes after CHG soap is applied, then you may rinse off the CHG soap.  Pat dry with a clean towel  Put on clean pajamas    Additional instructions for the day of surgery: DO NOT APPLY any lotions, deodorants, cologne, or perfumes.   Do not wear jewelry or makeup Do not wear nail polish, gel polish, artificial nails, or any other type of covering on natural nails (fingers and toes) Do not bring valuables to the hospital. [facility] is not responsible for valuables/personal belongings. Put on clean/comfortable clothes.  Please brush your teeth.  Ask your nurse before applying any prescription medications to the skin.

## 2023-10-24 ENCOUNTER — Encounter (HOSPITAL_COMMUNITY): Payer: Self-pay

## 2023-10-24 ENCOUNTER — Encounter (HOSPITAL_COMMUNITY)
Admission: RE | Admit: 2023-10-24 | Discharge: 2023-10-24 | Disposition: A | Discharge status: Home / Self Care | Source: Ambulatory Visit | Attending: Vascular Surgery | Admitting: Vascular Surgery

## 2023-10-24 ENCOUNTER — Other Ambulatory Visit: Payer: Self-pay

## 2023-10-24 VITALS — BP 141/78 | HR 100 | Temp 98.3°F | Resp 18 | Ht 63.0 in | Wt 172.5 lb

## 2023-10-24 DIAGNOSIS — I517 Cardiomegaly: Secondary | ICD-10-CM | POA: Insufficient documentation

## 2023-10-24 DIAGNOSIS — Z01812 Encounter for preprocedural laboratory examination: Secondary | ICD-10-CM | POA: Insufficient documentation

## 2023-10-24 DIAGNOSIS — J439 Emphysema, unspecified: Secondary | ICD-10-CM | POA: Insufficient documentation

## 2023-10-24 DIAGNOSIS — I350 Nonrheumatic aortic (valve) stenosis: Secondary | ICD-10-CM | POA: Insufficient documentation

## 2023-10-24 DIAGNOSIS — I700 Atherosclerosis of aorta: Secondary | ICD-10-CM | POA: Insufficient documentation

## 2023-10-24 DIAGNOSIS — R931 Abnormal findings on diagnostic imaging of heart and coronary circulation: Secondary | ICD-10-CM | POA: Insufficient documentation

## 2023-10-24 DIAGNOSIS — I6523 Occlusion and stenosis of bilateral carotid arteries: Secondary | ICD-10-CM | POA: Insufficient documentation

## 2023-10-24 DIAGNOSIS — S270XXA Traumatic pneumothorax, initial encounter: Secondary | ICD-10-CM | POA: Diagnosis not present

## 2023-10-24 DIAGNOSIS — Z01818 Encounter for other preprocedural examination: Secondary | ICD-10-CM

## 2023-10-24 DIAGNOSIS — I10 Essential (primary) hypertension: Secondary | ICD-10-CM | POA: Insufficient documentation

## 2023-10-24 HISTORY — DX: Headache, unspecified: R51.9

## 2023-10-24 HISTORY — DX: Chronic obstructive pulmonary disease, unspecified: J44.9

## 2023-10-24 HISTORY — DX: Depression, unspecified: F32.A

## 2023-10-24 HISTORY — DX: Atherosclerotic heart disease of native coronary artery without angina pectoris: I25.10

## 2023-10-24 HISTORY — DX: Peripheral vascular disease, unspecified: I73.9

## 2023-10-24 HISTORY — DX: Gastro-esophageal reflux disease without esophagitis: K21.9

## 2023-10-24 HISTORY — DX: Unspecified osteoarthritis, unspecified site: M19.90

## 2023-10-24 HISTORY — DX: Pneumonia, unspecified organism: J18.9

## 2023-10-24 HISTORY — DX: Anxiety disorder, unspecified: F41.9

## 2023-10-24 LAB — COMPREHENSIVE METABOLIC PANEL
ALT: 28 U/L (ref 0–44)
AST: 34 U/L (ref 15–41)
Albumin: 3.8 g/dL (ref 3.5–5.0)
Alkaline Phosphatase: 83 U/L (ref 38–126)
Anion gap: 13 (ref 5–15)
BUN: 5 mg/dL — ABNORMAL LOW (ref 8–23)
CO2: 26 mmol/L (ref 22–32)
Calcium: 9.2 mg/dL (ref 8.9–10.3)
Chloride: 96 mmol/L — ABNORMAL LOW (ref 98–111)
Creatinine, Ser: 0.73 mg/dL (ref 0.44–1.00)
GFR, Estimated: >60 mL/min (ref >60)
Glucose, Bld: 111 mg/dL — ABNORMAL HIGH (ref 70–99)
Potassium: 4.5 mmol/L (ref 3.5–5.1)
Sodium: 135 mmol/L (ref 135–145)
Total Bilirubin: 0.8 mg/dL (ref 0.0–1.2)
Total Protein: 7.1 g/dL (ref 6.5–8.1)

## 2023-10-24 LAB — SURGICAL PCR SCREEN
MRSA, PCR: NEGATIVE
Staphylococcus aureus: NEGATIVE

## 2023-10-24 LAB — CBC
HCT: 51.6 % — ABNORMAL HIGH (ref 36.0–46.0)
Hemoglobin: 16.5 g/dL — ABNORMAL HIGH (ref 12.0–15.0)
MCH: 32.7 pg (ref 26.0–34.0)
MCHC: 32 g/dL (ref 30.0–36.0)
MCV: 102.2 fL — ABNORMAL HIGH (ref 80.0–100.0)
Platelets: 337 10*3/uL (ref 150–400)
RBC: 5.05 MIL/uL (ref 3.87–5.11)
RDW: 15.4 % (ref 11.5–15.5)
WBC: 11.4 10*3/uL — ABNORMAL HIGH (ref 4.0–10.5)
nRBC: 0 % (ref 0.0–0.2)

## 2023-10-24 LAB — URINALYSIS, ROUTINE W REFLEX MICROSCOPIC
Bilirubin Urine: NEGATIVE
Glucose, UA: NEGATIVE mg/dL
Hgb urine dipstick: NEGATIVE
Ketones, ur: NEGATIVE mg/dL
Leukocytes,Ua: NEGATIVE
Nitrite: NEGATIVE
Protein, ur: NEGATIVE mg/dL
Specific Gravity, Urine: 1.017 (ref 1.005–1.030)
pH: 5 (ref 5.0–8.0)

## 2023-10-24 LAB — APTT: aPTT: 34 s (ref 24–36)

## 2023-10-24 LAB — PROTIME-INR
INR: 0.9 (ref 0.8–1.2)
Prothrombin Time: 12.3 s (ref 11.4–15.2)

## 2023-10-24 NOTE — Progress Notes (Addendum)
 PCP - Vernona Rieger, NP Cardiologist - Dr. Kristeen Miss - Last office visit 09/06/2023  PPM/ICD - Denies Device Orders - n/a Rep Notified - n/a  Chest x-ray - n/a EKG - 09/06/2023 Stress Test - Denies ECHO - 10/15/2023 Cardiac Cath - Denies CT Coronary - 10/11/2023 with FFR 10/13/2023  Sleep Study - Denies CPAP - n/a  No DM  Last dose of GLP1 agonist-  n/a GLP1 instructions: n/a  Blood Thinner Instructions: n/a Aspirin Instructions: Pt will continue ASA through the morning of surgery  NPO after midnight  COVID TEST- n/a   Anesthesia review: Yes. Cardiac clearance and hx of COPD   Patient denies shortness of breath, fever, cough and chest pain at PAT appointment. Pt endorses clear nasal drainage from seasonal allergies, but no respiratory illness/infection in the last two months.   All instructions explained to the patient, with a verbal understanding of the material. Patient agrees to go over the instructions while at home for a better understanding. Patient also instructed to self quarantine after being tested for COVID-19. The opportunity to ask questions was provided.

## 2023-10-25 ENCOUNTER — Encounter: Payer: Self-pay | Admitting: Cardiovascular Disease

## 2023-10-25 LAB — TYPE AND SCREEN
ABO/RH(D): O POS
Antibody Screen: NEGATIVE

## 2023-10-25 NOTE — Anesthesia Preprocedure Evaluation (Signed)
 Anesthesia Evaluation    Airway        Dental   Pulmonary Patient abstained from smoking., former smoker          Cardiovascular hypertension,      Neuro/Psych    GI/Hepatic   Endo/Other    Renal/GU      Musculoskeletal   Abdominal   Peds  Hematology   Anesthesia Other Findings   Reproductive/Obstetrics                              Anesthesia Physical Anesthesia Plan  ASA:   Anesthesia Plan:    Post-op Pain Management:    Induction:   PONV Risk Score and Plan:   Airway Management Planned:   Additional Equipment:   Intra-op Plan:   Post-operative Plan:   Informed Consent:   Plan Discussed with:   Anesthesia Plan Comments: (PAT note by Antionette Poles, PA-C:  63 year old female follows with cardiology for history of elevated coronary calcium score, HTN, HLD, severe bilateral carotid artery stenosis.  Seen by Dr. Elease Hashimoto on 09/06/2023 for preop evaluation prior to having carotid endarterectomy.  Coronary CTA ordered at that time and done on 10/11/2023 showed severe mixed density plaque in the proximal LAD, however, FFR analysis showed no hemodynamically significant stenosis.  Echo 10/15/2023 showed EF 70 to 75%, grade 1 DD, moderate aortic stenosis with mean gradient 25.7 mmHg.  Other pertinent history includes migraines, GERD on H2 blocker, former smoker (28.5 pack years, quit 05/2023) with associated COPD maintained on Wixela and hub and as needed albuterol.  Preop labs reviewed, unremarkable.  EKG 09/06/2023: Sinus rhythm with occasional Premature ventricular complexes.  Rate 100. Nonspecific ST abnormality  CTA neck 06/20/2023: IMPRESSION: 1. Near occlusive stenosis of the proximal right ICA secondary to dense calcifications. 2. 65-70% stenosis of the proximal left ICA. 3. Atherosclerotic changes at the origins of both vertebral arteries without significant stenosis relative to  the more distal vessels. 4. No significant stenoses or branch vessel occlusions within the Circle of Willis. 5. Aortic Atherosclerosis (ICD10-I70.0) and Emphysema (ICD10-J43.9).  TTE 10/15/2023: 1. Left ventricular ejection fraction, by estimation, is 70 to 75%. The  left ventricle has hyperdynamic function. The left ventricle has no  regional wall motion abnormalities. There is moderate concentric left  ventricular hypertrophy. Left ventricular  diastolic parameters are consistent with Grade I diastolic dysfunction  (impaired relaxation).   2. Right ventricular systolic function is normal. The right ventricular  size is normal.   3. Left atrial size was moderately dilated.   4. The mitral valve is normal in structure. Trivial mitral valve  regurgitation. No evidence of mitral stenosis.   5. The aortic valve is tricuspid. There is moderate calcification of the  aortic valve. Aortic valve regurgitation is mild. Moderate aortic valve  stenosis. Aortic regurgitation PHT measures 404 msec. Aortic valve area,  by VTI measures 1.21 cm. Aortic  valve mean gradient measures 25.7 mmHg. Aortic valve Vmax measures 3.54  m/s.   6. The inferior vena cava is normal in size with greater than 50%  respiratory variability, suggesting right atrial pressure of 3 mmHg.   Coronary CTA with FFR analysis: FINDINGS: 1. Left Main: 1.0; low likelihood of hemodynamic significance.   2. Prox LAD: 0.87; low likelihood of hemodynamic significance. 3. Mid LAD: 0.85; Low likelihood of hemodynamic significance. 4. Distal LAD: 0.79; Low likelihood of hemodynamic significance (tapering artifact). 5. D1: 0.96; Low likelihood of hemodynamic  significance. 6. LCX: 0.98; low likelihood of hemodynamic significance. 7. RCA: 0.94; low likelihood of hemodynamic significance.   IMPRESSION:   1.  Proximal LAD is negative by CT FFR.    )         Anesthesia Quick Evaluation

## 2023-10-25 NOTE — Progress Notes (Signed)
 Anesthesia Chart Review:  63 year old female follows with cardiology for history of elevated coronary calcium score, HTN, HLD, severe bilateral carotid artery stenosis.  Seen by Dr. Elease Hashimoto on 09/06/2023 for preop evaluation prior to having carotid endarterectomy.  Coronary CTA ordered at that time and done on 10/11/2023 showed severe mixed density plaque in the proximal LAD, however, FFR analysis showed no hemodynamically significant stenosis.  Echo 10/15/2023 showed EF 70 to 75%, grade 1 DD, moderate aortic stenosis with mean gradient 25.7 mmHg.  Other pertinent history includes migraines, GERD on H2 blocker, former smoker (28.5 pack years, quit 05/2023) with associated COPD maintained on Wixela and hub and as needed albuterol.  Preop labs reviewed, unremarkable.  EKG 09/06/2023: Sinus rhythm with occasional Premature ventricular complexes.  Rate 100. Nonspecific ST abnormality  CTA neck 06/20/2023: IMPRESSION: 1. Near occlusive stenosis of the proximal right ICA secondary to dense calcifications. 2. 65-70% stenosis of the proximal left ICA. 3. Atherosclerotic changes at the origins of both vertebral arteries without significant stenosis relative to the more distal vessels. 4. No significant stenoses or branch vessel occlusions within the Circle of Willis. 5. Aortic Atherosclerosis (ICD10-I70.0) and Emphysema (ICD10-J43.9).  TTE 10/15/2023: 1. Left ventricular ejection fraction, by estimation, is 70 to 75%. The  left ventricle has hyperdynamic function. The left ventricle has no  regional wall motion abnormalities. There is moderate concentric left  ventricular hypertrophy. Left ventricular  diastolic parameters are consistent with Grade I diastolic dysfunction  (impaired relaxation).   2. Right ventricular systolic function is normal. The right ventricular  size is normal.   3. Left atrial size was moderately dilated.   4. The mitral valve is normal in structure. Trivial mitral valve   regurgitation. No evidence of mitral stenosis.   5. The aortic valve is tricuspid. There is moderate calcification of the  aortic valve. Aortic valve regurgitation is mild. Moderate aortic valve  stenosis. Aortic regurgitation PHT measures 404 msec. Aortic valve area,  by VTI measures 1.21 cm. Aortic  valve mean gradient measures 25.7 mmHg. Aortic valve Vmax measures 3.54  m/s.   6. The inferior vena cava is normal in size with greater than 50%  respiratory variability, suggesting right atrial pressure of 3 mmHg.   Coronary CTA with FFR analysis: FINDINGS: 1. Left Main: 1.0; low likelihood of hemodynamic significance.   2. Prox LAD: 0.87; low likelihood of hemodynamic significance. 3. Mid LAD: 0.85; Low likelihood of hemodynamic significance. 4. Distal LAD: 0.79; Low likelihood of hemodynamic significance (tapering artifact). 5. D1: 0.96; Low likelihood of hemodynamic significance. 6. LCX: 0.98; low likelihood of hemodynamic significance. 7. RCA: 0.94; low likelihood of hemodynamic significance.   IMPRESSION:   1.  Proximal LAD is negative by CT FFR.    Zannie Cove [facility]/Anesthesiology Phone [phone] 10/25/2023 4:26 PM

## 2023-10-26 ENCOUNTER — Emergency Department (HOSPITAL_COMMUNITY)

## 2023-10-26 ENCOUNTER — Inpatient Hospital Stay (HOSPITAL_COMMUNITY)
Admission: EM | Admit: 2023-10-26 | Discharge: 2023-10-31 | DRG: 199 | Disposition: A | Discharge status: Home Health | Attending: General Surgery | Admitting: General Surgery

## 2023-10-26 ENCOUNTER — Inpatient Hospital Stay (HOSPITAL_COMMUNITY)

## 2023-10-26 ENCOUNTER — Other Ambulatory Visit: Payer: Self-pay

## 2023-10-26 DIAGNOSIS — S22009A Unspecified fracture of unspecified thoracic vertebra, initial encounter for closed fracture: Secondary | ICD-10-CM | POA: Insufficient documentation

## 2023-10-26 DIAGNOSIS — R911 Solitary pulmonary nodule: Secondary | ICD-10-CM | POA: Diagnosis present

## 2023-10-26 DIAGNOSIS — Y92009 Unspecified place in unspecified non-institutional (private) residence as the place of occurrence of the external cause: Secondary | ICD-10-CM

## 2023-10-26 DIAGNOSIS — Z8249 Family history of ischemic heart disease and other diseases of the circulatory system: Secondary | ICD-10-CM | POA: Diagnosis not present

## 2023-10-26 DIAGNOSIS — E872 Acidosis, unspecified: Secondary | ICD-10-CM | POA: Diagnosis present

## 2023-10-26 DIAGNOSIS — S300XXA Contusion of lower back and pelvis, initial encounter: Secondary | ICD-10-CM | POA: Diagnosis present

## 2023-10-26 DIAGNOSIS — F32A Depression, unspecified: Secondary | ICD-10-CM | POA: Diagnosis present

## 2023-10-26 DIAGNOSIS — Z9842 Cataract extraction status, left eye: Secondary | ICD-10-CM

## 2023-10-26 DIAGNOSIS — E222 Syndrome of inappropriate secretion of antidiuretic hormone: Secondary | ICD-10-CM | POA: Diagnosis present

## 2023-10-26 DIAGNOSIS — Z87891 Personal history of nicotine dependence: Secondary | ICD-10-CM | POA: Diagnosis not present

## 2023-10-26 DIAGNOSIS — I6523 Occlusion and stenosis of bilateral carotid arteries: Secondary | ICD-10-CM | POA: Diagnosis not present

## 2023-10-26 DIAGNOSIS — S2241XA Multiple fractures of ribs, right side, initial encounter for closed fracture: Secondary | ICD-10-CM | POA: Diagnosis present

## 2023-10-26 DIAGNOSIS — F419 Anxiety disorder, unspecified: Secondary | ICD-10-CM | POA: Diagnosis not present

## 2023-10-26 DIAGNOSIS — R7401 Elevation of levels of liver transaminase levels: Secondary | ICD-10-CM | POA: Insufficient documentation

## 2023-10-26 DIAGNOSIS — J439 Emphysema, unspecified: Secondary | ICD-10-CM | POA: Diagnosis present

## 2023-10-26 DIAGNOSIS — D62 Acute posthemorrhagic anemia: Secondary | ICD-10-CM | POA: Diagnosis present

## 2023-10-26 DIAGNOSIS — E785 Hyperlipidemia, unspecified: Secondary | ICD-10-CM | POA: Diagnosis present

## 2023-10-26 DIAGNOSIS — M8580 Other specified disorders of bone density and structure, unspecified site: Secondary | ICD-10-CM | POA: Diagnosis present

## 2023-10-26 DIAGNOSIS — Z885 Allergy status to narcotic agent status: Secondary | ICD-10-CM | POA: Diagnosis not present

## 2023-10-26 DIAGNOSIS — E66811 Obesity, class 1: Secondary | ICD-10-CM | POA: Diagnosis present

## 2023-10-26 DIAGNOSIS — E559 Vitamin D deficiency, unspecified: Secondary | ICD-10-CM | POA: Diagnosis present

## 2023-10-26 DIAGNOSIS — S40211A Abrasion of right shoulder, initial encounter: Secondary | ICD-10-CM | POA: Diagnosis present

## 2023-10-26 DIAGNOSIS — Z79899 Other long term (current) drug therapy: Secondary | ICD-10-CM | POA: Diagnosis not present

## 2023-10-26 DIAGNOSIS — H5702 Anisocoria: Secondary | ICD-10-CM | POA: Diagnosis present

## 2023-10-26 DIAGNOSIS — S270XXA Traumatic pneumothorax, initial encounter: Secondary | ICD-10-CM | POA: Diagnosis present

## 2023-10-26 DIAGNOSIS — Z6830 Body mass index (BMI) 30.0-30.9, adult: Secondary | ICD-10-CM

## 2023-10-26 DIAGNOSIS — E8729 Other acidosis: Secondary | ICD-10-CM | POA: Diagnosis not present

## 2023-10-26 DIAGNOSIS — W109XXA Fall (on) (from) unspecified stairs and steps, initial encounter: Secondary | ICD-10-CM | POA: Diagnosis present

## 2023-10-26 DIAGNOSIS — I2510 Atherosclerotic heart disease of native coronary artery without angina pectoris: Secondary | ICD-10-CM | POA: Diagnosis present

## 2023-10-26 DIAGNOSIS — S32049A Unspecified fracture of fourth lumbar vertebra, initial encounter for closed fracture: Secondary | ICD-10-CM | POA: Diagnosis present

## 2023-10-26 DIAGNOSIS — I70203 Unspecified atherosclerosis of native arteries of extremities, bilateral legs: Secondary | ICD-10-CM | POA: Diagnosis present

## 2023-10-26 DIAGNOSIS — S32009A Unspecified fracture of unspecified lumbar vertebra, initial encounter for closed fracture: Secondary | ICD-10-CM

## 2023-10-26 DIAGNOSIS — Z7982 Long term (current) use of aspirin: Secondary | ICD-10-CM

## 2023-10-26 DIAGNOSIS — E871 Hypo-osmolality and hyponatremia: Secondary | ICD-10-CM | POA: Diagnosis not present

## 2023-10-26 DIAGNOSIS — W19XXXA Unspecified fall, initial encounter: Secondary | ICD-10-CM | POA: Diagnosis not present

## 2023-10-26 DIAGNOSIS — I10 Essential (primary) hypertension: Secondary | ICD-10-CM | POA: Diagnosis present

## 2023-10-26 DIAGNOSIS — I700 Atherosclerosis of aorta: Secondary | ICD-10-CM | POA: Diagnosis present

## 2023-10-26 DIAGNOSIS — Z8049 Family history of malignant neoplasm of other genital organs: Secondary | ICD-10-CM

## 2023-10-26 DIAGNOSIS — M1990 Unspecified osteoarthritis, unspecified site: Secondary | ICD-10-CM | POA: Diagnosis present

## 2023-10-26 DIAGNOSIS — Z01812 Encounter for preprocedural laboratory examination: Secondary | ICD-10-CM

## 2023-10-26 DIAGNOSIS — I6529 Occlusion and stenosis of unspecified carotid artery: Secondary | ICD-10-CM | POA: Diagnosis present

## 2023-10-26 DIAGNOSIS — Z9049 Acquired absence of other specified parts of digestive tract: Secondary | ICD-10-CM

## 2023-10-26 DIAGNOSIS — R000 Tachycardia, unspecified: Secondary | ICD-10-CM | POA: Diagnosis present

## 2023-10-26 DIAGNOSIS — J9601 Acute respiratory failure with hypoxia: Secondary | ICD-10-CM | POA: Diagnosis not present

## 2023-10-26 DIAGNOSIS — Z8261 Family history of arthritis: Secondary | ICD-10-CM

## 2023-10-26 DIAGNOSIS — I779 Disorder of arteries and arterioles, unspecified: Secondary | ICD-10-CM | POA: Diagnosis present

## 2023-10-26 DIAGNOSIS — W108XXA Fall (on) (from) other stairs and steps, initial encounter: Secondary | ICD-10-CM

## 2023-10-26 DIAGNOSIS — R7989 Other specified abnormal findings of blood chemistry: Secondary | ICD-10-CM | POA: Diagnosis present

## 2023-10-26 DIAGNOSIS — S20229A Contusion of unspecified back wall of thorax, initial encounter: Secondary | ICD-10-CM | POA: Diagnosis present

## 2023-10-26 DIAGNOSIS — J690 Pneumonitis due to inhalation of food and vomit: Secondary | ICD-10-CM | POA: Diagnosis present

## 2023-10-26 DIAGNOSIS — J449 Chronic obstructive pulmonary disease, unspecified: Secondary | ICD-10-CM | POA: Diagnosis present

## 2023-10-26 DIAGNOSIS — R296 Repeated falls: Secondary | ICD-10-CM

## 2023-10-26 DIAGNOSIS — K219 Gastro-esophageal reflux disease without esophagitis: Secondary | ICD-10-CM | POA: Diagnosis present

## 2023-10-26 DIAGNOSIS — D751 Secondary polycythemia: Secondary | ICD-10-CM | POA: Diagnosis present

## 2023-10-26 DIAGNOSIS — I350 Nonrheumatic aortic (valve) stenosis: Secondary | ICD-10-CM | POA: Diagnosis present

## 2023-10-26 DIAGNOSIS — H5461 Unqualified visual loss, right eye, normal vision left eye: Secondary | ICD-10-CM | POA: Diagnosis present

## 2023-10-26 DIAGNOSIS — E860 Dehydration: Secondary | ICD-10-CM | POA: Diagnosis present

## 2023-10-26 DIAGNOSIS — Z8709 Personal history of other diseases of the respiratory system: Secondary | ICD-10-CM

## 2023-10-26 DIAGNOSIS — Z9841 Cataract extraction status, right eye: Secondary | ICD-10-CM

## 2023-10-26 HISTORY — DX: Unspecified fracture of unspecified lumbar vertebra, initial encounter for closed fracture: S32.009A

## 2023-10-26 HISTORY — DX: Repeated falls: R29.6

## 2023-10-26 HISTORY — DX: Other acidosis: E87.29

## 2023-10-26 LAB — CBC WITH DIFFERENTIAL/PLATELET
Abs Immature Granulocytes: 0.12 10*3/uL — ABNORMAL HIGH (ref 0.00–0.07)
Basophils Absolute: 0.1 10*3/uL (ref 0.0–0.1)
Basophils Relative: 0 %
Eosinophils Absolute: 0 10*3/uL (ref 0.0–0.5)
Eosinophils Relative: 0 %
HCT: 49.7 % — ABNORMAL HIGH (ref 36.0–46.0)
Hemoglobin: 16.2 g/dL — ABNORMAL HIGH (ref 12.0–15.0)
Immature Granulocytes: 1 %
Lymphocytes Relative: 4 %
Lymphs Abs: 0.6 10*3/uL — ABNORMAL LOW (ref 0.7–4.0)
MCH: 32.9 pg (ref 26.0–34.0)
MCHC: 32.6 g/dL (ref 30.0–36.0)
MCV: 100.8 fL — ABNORMAL HIGH (ref 80.0–100.0)
Monocytes Absolute: 1.2 10*3/uL — ABNORMAL HIGH (ref 0.1–1.0)
Monocytes Relative: 7 %
Neutro Abs: 15 10*3/uL — ABNORMAL HIGH (ref 1.7–7.7)
Neutrophils Relative %: 88 %
Platelets: 311 10*3/uL (ref 150–400)
RBC: 4.93 MIL/uL (ref 3.87–5.11)
RDW: 15.1 % (ref 11.5–15.5)
WBC: 17.1 10*3/uL — ABNORMAL HIGH (ref 4.0–10.5)
nRBC: 0 % (ref 0.0–0.2)

## 2023-10-26 LAB — I-STAT CHEM 8, ED
BUN: 7 mg/dL — ABNORMAL LOW (ref 8–23)
Calcium, Ion: 1.03 mmol/L — ABNORMAL LOW (ref 1.15–1.40)
Chloride: 101 mmol/L (ref 98–111)
Creatinine, Ser: 0.7 mg/dL (ref 0.44–1.00)
Glucose, Bld: 120 mg/dL — ABNORMAL HIGH (ref 70–99)
HCT: 54 % — ABNORMAL HIGH (ref 36.0–46.0)
Hemoglobin: 18.4 g/dL — ABNORMAL HIGH (ref 12.0–15.0)
Potassium: 4 mmol/L (ref 3.5–5.1)
Sodium: 129 mmol/L — ABNORMAL LOW (ref 135–145)
TCO2: 19 mmol/L — ABNORMAL LOW (ref 22–32)

## 2023-10-26 LAB — COMPREHENSIVE METABOLIC PANEL
ALT: 39 U/L (ref 0–44)
AST: 66 U/L — ABNORMAL HIGH (ref 15–41)
Albumin: 3.7 g/dL (ref 3.5–5.0)
Alkaline Phosphatase: 79 U/L (ref 38–126)
Anion gap: 15 (ref 5–15)
BUN: 7 mg/dL — ABNORMAL LOW (ref 8–23)
CO2: 18 mmol/L — ABNORMAL LOW (ref 22–32)
Calcium: 9.1 mg/dL (ref 8.9–10.3)
Chloride: 97 mmol/L — ABNORMAL LOW (ref 98–111)
Creatinine, Ser: 0.78 mg/dL (ref 0.44–1.00)
GFR, Estimated: >60 mL/min (ref >60)
Glucose, Bld: 125 mg/dL — ABNORMAL HIGH (ref 70–99)
Potassium: 4 mmol/L (ref 3.5–5.1)
Sodium: 130 mmol/L — ABNORMAL LOW (ref 135–145)
Total Bilirubin: 0.5 mg/dL (ref 0.0–1.2)
Total Protein: 6.7 g/dL (ref 6.5–8.1)

## 2023-10-26 LAB — PROTIME-INR
INR: 0.9 (ref 0.8–1.2)
Prothrombin Time: 12.3 s (ref 11.4–15.2)

## 2023-10-26 LAB — VITAMIN D 25 HYDROXY (VIT D DEFICIENCY, FRACTURES): Vit D, 25-Hydroxy: 8.28 ng/mL — ABNORMAL LOW (ref 30–100)

## 2023-10-26 LAB — SODIUM, URINE, RANDOM: Sodium, Ur: 14 mmol/L

## 2023-10-26 LAB — PHOSPHORUS: Phosphorus: 3.3 mg/dL (ref 2.5–4.6)

## 2023-10-26 LAB — MAGNESIUM: Magnesium: 1.7 mg/dL (ref 1.7–2.4)

## 2023-10-26 LAB — TSH: TSH: 1.043 u[IU]/mL (ref 0.350–4.500)

## 2023-10-26 LAB — OSMOLALITY, URINE: Osmolality, Ur: 498 mosm/kg (ref 300–900)

## 2023-10-26 MED ORDER — VITAMIN D (ERGOCALCIFEROL) 1.25 MG (50000 UNIT) PO CAPS
50000.0000 [IU] | ORAL_CAPSULE | ORAL | Status: DC
  Administered 2023-10-26: 50000 [IU] via ORAL
  Filled 2023-10-26: qty 1

## 2023-10-26 MED ORDER — FAMOTIDINE 20 MG PO TABS
20.0000 mg | ORAL_TABLET | Freq: Every day | ORAL | Status: DC | PRN
  Administered 2023-10-27: 20 mg via ORAL
  Filled 2023-10-26: qty 1

## 2023-10-26 MED ORDER — IPRATROPIUM-ALBUTEROL 0.5-2.5 (3) MG/3ML IN SOLN
3.0000 mL | RESPIRATORY_TRACT | Status: DC | PRN
  Administered 2023-10-28 – 2023-10-30 (×4): 3 mL via RESPIRATORY_TRACT
  Filled 2023-10-26 (×4): qty 3

## 2023-10-26 MED ORDER — FENTANYL CITRATE PF 50 MCG/ML IJ SOSY
50.0000 ug | PREFILLED_SYRINGE | Freq: Once | INTRAMUSCULAR | Status: AC
  Administered 2023-10-26: 50 ug via INTRAVENOUS
  Filled 2023-10-26: qty 1

## 2023-10-26 MED ORDER — HYDROMORPHONE HCL 1 MG/ML IJ SOLN
1.0000 mg | Freq: Once | INTRAMUSCULAR | Status: AC
  Administered 2023-10-26: 1 mg via INTRAVENOUS
  Filled 2023-10-26: qty 1

## 2023-10-26 MED ORDER — ARFORMOTEROL TARTRATE 15 MCG/2ML IN NEBU
15.0000 ug | INHALATION_SOLUTION | Freq: Two times a day (BID) | RESPIRATORY_TRACT | Status: DC
  Administered 2023-10-26 – 2023-10-31 (×8): 15 ug via RESPIRATORY_TRACT
  Filled 2023-10-26 (×9): qty 2

## 2023-10-26 MED ORDER — BUDESONIDE 0.5 MG/2ML IN SUSP
0.5000 mg | Freq: Two times a day (BID) | RESPIRATORY_TRACT | Status: DC
  Administered 2023-10-26 – 2023-10-31 (×8): 0.5 mg via RESPIRATORY_TRACT
  Filled 2023-10-26 (×9): qty 2

## 2023-10-26 MED ORDER — GABAPENTIN 300 MG PO CAPS
300.0000 mg | ORAL_CAPSULE | Freq: Three times a day (TID) | ORAL | Status: DC
  Administered 2023-10-26 – 2023-10-31 (×15): 300 mg via ORAL
  Filled 2023-10-26 (×16): qty 1

## 2023-10-26 MED ORDER — ENOXAPARIN SODIUM 30 MG/0.3ML IJ SOSY
30.0000 mg | PREFILLED_SYRINGE | Freq: Two times a day (BID) | INTRAMUSCULAR | Status: DC
  Administered 2023-10-26 – 2023-10-27 (×4): 30 mg via SUBCUTANEOUS
  Filled 2023-10-26 (×5): qty 0.3

## 2023-10-26 MED ORDER — LABETALOL HCL 5 MG/ML IV SOLN: 10.0000 mg | INTRAVENOUS | Status: DC | PRN

## 2023-10-26 MED ORDER — KETOROLAC TROMETHAMINE 15 MG/ML IJ SOLN
15.0000 mg | Freq: Four times a day (QID) | INTRAMUSCULAR | Status: DC
  Administered 2023-10-26 – 2023-10-28 (×8): 15 mg via INTRAVENOUS
  Filled 2023-10-26 (×8): qty 1

## 2023-10-26 MED ORDER — VITAMIN B-12 1000 MCG PO TABS
1000.0000 ug | ORAL_TABLET | Freq: Every day | ORAL | Status: DC
  Administered 2023-10-26 – 2023-10-31 (×6): 1000 ug via ORAL
  Filled 2023-10-26 (×6): qty 1

## 2023-10-26 MED ORDER — LIDOCAINE 5 % EX PTCH
3.0000 | MEDICATED_PATCH | CUTANEOUS | Status: DC
  Administered 2023-10-26 – 2023-10-31 (×6): 3 via TRANSDERMAL
  Filled 2023-10-26 (×7): qty 3

## 2023-10-26 MED ORDER — ASPIRIN 81 MG PO TBEC
162.0000 mg | DELAYED_RELEASE_TABLET | Freq: Every day | ORAL | Status: DC
  Administered 2023-10-26 – 2023-10-27 (×2): 162 mg via ORAL
  Filled 2023-10-26 (×2): qty 2

## 2023-10-26 MED ORDER — ACETAMINOPHEN 500 MG PO TABS
1000.0000 mg | ORAL_TABLET | Freq: Four times a day (QID) | ORAL | Status: DC
  Administered 2023-10-26 – 2023-10-31 (×21): 1000 mg via ORAL
  Filled 2023-10-26 (×21): qty 2

## 2023-10-26 MED ORDER — SODIUM CHLORIDE 0.9 % IV SOLN: INTRAVENOUS | Status: DC

## 2023-10-26 MED ORDER — OXYCODONE HCL 5 MG PO TABS
5.0000 mg | ORAL_TABLET | ORAL | Status: DC | PRN
  Administered 2023-10-26 – 2023-10-27 (×6): 5 mg via ORAL
  Filled 2023-10-26 (×6): qty 1

## 2023-10-26 MED ORDER — HYDROMORPHONE HCL 1 MG/ML IJ SOLN
1.0000 mg | INTRAMUSCULAR | Status: DC | PRN
  Administered 2023-10-26 – 2023-10-29 (×6): 1 mg via INTRAVENOUS
  Filled 2023-10-26 (×7): qty 1

## 2023-10-26 MED ORDER — ATORVASTATIN CALCIUM 80 MG PO TABS
80.0000 mg | ORAL_TABLET | Freq: Every day | ORAL | Status: DC
  Administered 2023-10-26 – 2023-10-31 (×6): 80 mg via ORAL
  Filled 2023-10-26 (×6): qty 1

## 2023-10-26 MED ORDER — AMLODIPINE BESYLATE 10 MG PO TABS
10.0000 mg | ORAL_TABLET | Freq: Every day | ORAL | Status: DC
  Administered 2023-10-26 – 2023-10-28 (×3): 10 mg via ORAL
  Filled 2023-10-26 (×3): qty 1

## 2023-10-26 MED ORDER — ESCITALOPRAM OXALATE 10 MG PO TABS
20.0000 mg | ORAL_TABLET | Freq: Every day | ORAL | Status: DC
  Administered 2023-10-26 – 2023-10-27 (×2): 20 mg via ORAL
  Filled 2023-10-26 (×2): qty 2

## 2023-10-26 MED ORDER — CALCIUM GLUCONATE-NACL 2-0.675 GM/100ML-% IV SOLN
2.0000 g | Freq: Once | INTRAVENOUS | Status: AC
  Administered 2023-10-26: 2000 mg via INTRAVENOUS
  Filled 2023-10-26: qty 100

## 2023-10-26 MED ORDER — LIDOCAINE HCL (PF) 1 % IJ SOLN
INTRAMUSCULAR | Status: AC
  Filled 2023-10-26: qty 5

## 2023-10-26 MED ORDER — IOHEXOL 350 MG/ML SOLN
75.0000 mL | Freq: Once | INTRAVENOUS | Status: AC | PRN
  Administered 2023-10-26: 75 mL via INTRAVENOUS

## 2023-10-26 MED ORDER — REVEFENACIN 175 MCG/3ML IN SOLN
175.0000 ug | Freq: Every day | RESPIRATORY_TRACT | Status: DC
  Administered 2023-10-26 – 2023-10-31 (×5): 175 ug via RESPIRATORY_TRACT
  Filled 2023-10-26 (×6): qty 3

## 2023-10-26 NOTE — H&P (Signed)
 HPI  Denise [name] is an 63 y.o. female with multiple medical comorbidities including carotid stenosis with baseline blindness of right eye who fell late yesterday evening.  Patient states she felt dizzy and lost balance and fell off her porch from of a height of 2-3 feet onto the grass. She had severe pain to right side of her chest, neck and lower back. No abdominal pain. No numbness or weakness. Patient having moderate amount of coughing from irritation.  Given moderate sized pneumothorax on CXR, elected to place chest tube prior to CT scans. Patient tolerated procedure well and had improvement in SOB and coughing.  Of note, patient had upcoming endarterectomy scheduled with Dr. Chestine Spore on 3/28.  10 point review of systems is negative except as listed above in HPI.  Objective  Past Medical History: Past Medical History:  Diagnosis Date   Allergy    Anemia 12/24/2019   slow Colon bleed   Anxiety    Arthritis    BMI 35.0-35.9,adult 03/31/2012   Chickenpox    Cholecystitis with cholelithiasis 03/31/2012   COPD (chronic obstructive pulmonary disease) (HCC)    Coronary artery disease    Nonobstructive   Depression    GERD (gastroesophageal reflux disease)    Headache    Heart murmur    Hypertension 03/31/2012   Peripheral vascular disease (HCC)    Right Carotid Artery Stenosis   Pneumonia    Shingles     Past Surgical History: Past Surgical History:  Procedure Laterality Date   BIOPSY  12/24/2019   Procedure: BIOPSY;  Surgeon: Rachael Fee, MD;  Location: [facility] ENDOSCOPY;  Service: Endoscopy;;   CATARACT EXTRACTION Bilateral 2021   CHOLECYSTECTOMY  03/30/2012   Procedure: LAPAROSCOPIC CHOLECYSTECTOMY WITH INTRAOPERATIVE CHOLANGIOGRAM;  Surgeon: Mariella Saa, MD;  Location: [facility] ORS;  Service: General;  Laterality: N/A;   COLONOSCOPY WITH PROPOFOL N/A 12/24/2019   Procedure: COLONOSCOPY WITH PROPOFOL;  Surgeon: Rachael Fee, MD;  Location: [facility] ENDOSCOPY;   Service: Endoscopy;  Laterality: N/A;   ESOPHAGOGASTRODUODENOSCOPY (EGD) WITH PROPOFOL N/A 12/24/2019   Procedure: ESOPHAGOGASTRODUODENOSCOPY (EGD) WITH PROPOFOL;  Surgeon: Rachael Fee, MD;  Location: [facility] ENDOSCOPY;  Service: Endoscopy;  Laterality: N/A;   HOT HEMOSTASIS N/A 12/24/2019   Procedure: HOT HEMOSTASIS (ARGON PLASMA COAGULATION/BICAP);  Surgeon: Rachael Fee, MD;  Location: [facility] ENDOSCOPY;  Service: Endoscopy;  Laterality: N/A;   POLYPECTOMY  12/24/2019   Procedure: POLYPECTOMY;  Surgeon: Rachael Fee, MD;  Location: [facility] ENDOSCOPY;  Service: Endoscopy;;   TUBAL LIGATION  1997    Family History:  Family History  Problem Relation Age of Onset   Arthritis Mother    Cervical cancer Mother    Heart disease Father    Hypertension Father     Social History:  reports that she quit smoking about 5 months ago. Her smoking use included cigarettes. She started smoking about 38 years ago. She has a 28.5 pack-year smoking history. She has never used smokeless tobacco. She reports current alcohol use. She reports that she does not use drugs.  Allergies:  Allergies  Allergen Reactions   Codeine Itching    Medications: I have reviewed the patient's current medications.  Labs: I have personally reviewed all labs for the past 24h  Imaging: I have personally reviewed and interpreted all imaging for the past 24h and agree with the radiologist's impression.  DG Chest Portable 1 View Result Date: 10/26/2023 CLINICAL DATA:  63 year old female with right rib fractures, pneumothorax,  subcutaneous emphysema. Right chest tube placed. EXAM: PORTABLE CHEST 1 VIEW COMPARISON:  0413 hours today and earlier. FINDINGS: Portable AP semi upright view at 0614 hours. Pigtail type right chest tube placed over the mid right lung. Continued extensive subcutaneous chest and neck soft tissue gas which limits right lung detail. But the previously seen pleural edge is not identified now. Mildly lower lung  volumes. Stable cardiac size and mediastinal contours. No new lung opacity. Stable visualized osseous structures. IMPRESSION: 1. Right pneumothorax no longer visible following pigtail right chest tube placement. 2. Ongoing extensive chest and neck subcutaneous emphysema. 3. Right lateral rib fractures. Electronically Signed   By: Odessa Fleming M.D.   On: 10/26/2023 06:22   DG Ribs Unilateral W/Chest Right Addendum Date: 10/26/2023 ADDENDUM REPORT: 10/26/2023 05:25 ADDENDUM: Study discussed by telephone with Dr. Glynn Octave on 10/26/2023 at 0515 hours. Electronically Signed   By: Odessa Fleming M.D.   On: 10/26/2023 05:25   Result Date: 10/26/2023 CLINICAL DATA:  63 year old female status post fall.  Pain. EXAM: RIGHT RIBS AND CHEST - 3+ VIEW COMPARISON:  Cardiac CT earlier this month 10/11/2023. FINDINGS: AP view of the chest at 0413 hours. Large volume of chest wall subcutaneous gas which is more pronounced on the right, but extends bilaterally and into the bilateral lower neck. On 2 oblique views of the right ribs displaced right lateral 4th through 7th rib fractures are noted. Decreased lung detail due to extensive subcutaneous emphysema. However, a right side pneumothorax is confirmed on images #1 and #2, best seen on image #2 with pleural edge visible down to the lateral right 5th or 6th rib level on that image. No pleural effusion or pulmonary contusion is identified. Stable lung volumes compared to earlier this month. Mediastinal contours remain within normal limits. Visualized tracheal air column is within normal limits. Negative visible bowel gas. IMPRESSION: 1. Displaced right lateral 4th through 7th rib fractures with small to moderate Right Pneumothorax and evidence of Large Pleural Air Leak: Extensive chest wall and lower neck subcutaneous emphysema. 2. No pleural effusion or pulmonary contusion identified. Electronically Signed: By: Odessa Fleming M.D. On: 10/26/2023 05:13   DG Cervical Spine 2 or 3  views Result Date: 10/26/2023 CLINICAL DATA:  Fall. EXAM: CERVICAL SPINE - 2-3 VIEW COMPARISON:  None Available. FINDINGS: Three views study is markedly limited by bony demineralization. Shoulder position, and extensive soft tissue gas in the neck. Lateral film images from the skull base to the C4 vertebral body but does not visualized lower cervical levels. Frontal film is limited by the above described features and positioning. IMPRESSION: 1. While no acute fractures identified, study is considered nondiagnostic due to above described limitations. CT imaging recommended for definitive assessment. 2. Extensive mediastinal and soft tissue gas in the neck. I personally called these results directly to Dr. Manus Gunning at approximately 0508 hours on 10/26/2023. Electronically Signed   By: Kennith Center M.D.   On: 10/26/2023 05:08     Physical Exam Blood pressure 124/74, pulse 87, temperature 98.6 F (37 C), temperature source Oral, resp. rate 16, SpO2 94%. General: Moderate distress due to pain and discomfort HEENT: R pupil > L (~38mm v ~11mm)--this is patient's baseline. Blind in R eye. Hearing intact. Cervical collar in place. No cervical pain Chest: Tenderness to palpation to right lateral, anterior and posterior chest wall. Equal chest rise. Shallow breaths secondary to pain, decreased breath sounds on right. 2L [place] Oropharynx: mucous membranes dry CV: Sinus tachycardia, normotensive Abdomen: soft,  nondistended, nontender Back: nontender to palpation. Small superficial abrasions to right scapula. Rectal: deferred Extremities: moves all extremities, no peripheral edema Skin: warm, dry, no rashes Psych: normal memory, normal mood/affect  Neuro: No focal neurologic deficits, A&Ox3. No numbness/tingling.    Assessment   Denise [name] is an 64 y.o. female who presents s/p fall on 3/21 with right sided rib fractures and right moderate sized pneumothorax.  Plan  - Continue chest tube to -20  suction - Please obtain trauma pan scans - We will follow up scans and tentatively admit, level of care to be determined pending any further injuries. - 2g calcium ordered. Mg/Phos labs ordered - Consult TRH for co-management given comorbidities - Multimodal pain meds ordered I reviewed ED provider notes, last 24 h vitals and pain scores, last 48 h intake and output, last 24 h labs and trends, and last 24 h imaging results.  This care required moderate level of medical decision making.   Donata Duff, MD [facility]

## 2023-10-26 NOTE — ED Notes (Signed)
 Informed consent obtained for chest tube.

## 2023-10-26 NOTE — ED Notes (Addendum)
 Denise [name]

## 2023-10-26 NOTE — ED Notes (Signed)
 Trauma Response Nurse Documentation   Denise [name] is a 63 y.o. female arriving to [facility] ED via POV  On No antithrombotic. Trauma was activated as a Level 2 by EDP based on the following trauma criteria Discretion of Emergency Department Physician.  Patient cleared for CT by Dr. Manus Gunning. Pt transported to CT with trauma response nurse present to monitor. RN remained with the patient throughout their absence from the department for clinical observation.   GCS 15.  Trauma MD Arrival Time: 58.  History   Past Medical History:  Diagnosis Date   Allergy    Anemia 12/24/2019   slow Colon bleed   Anxiety    Arthritis    BMI 35.0-35.9,adult 03/31/2012   Chickenpox    Cholecystitis with cholelithiasis 03/31/2012   COPD (chronic obstructive pulmonary disease) (HCC)    Coronary artery disease    Nonobstructive   Depression    GERD (gastroesophageal reflux disease)    Headache    Heart murmur    Hypertension 03/31/2012   Peripheral vascular disease (HCC)    Right Carotid Artery Stenosis   Pneumonia    Shingles      Past Surgical History:  Procedure Laterality Date   BIOPSY  12/24/2019   Procedure: BIOPSY;  Surgeon: Rachael Fee, MD;  Location: [facility] ENDOSCOPY;  Service: Endoscopy;;   CATARACT EXTRACTION Bilateral 2021   CHOLECYSTECTOMY  03/30/2012   Procedure: LAPAROSCOPIC CHOLECYSTECTOMY WITH INTRAOPERATIVE CHOLANGIOGRAM;  Surgeon: Mariella Saa, MD;  Location: [facility] ORS;  Service: General;  Laterality: N/A;   COLONOSCOPY WITH PROPOFOL N/A 12/24/2019   Procedure: COLONOSCOPY WITH PROPOFOL;  Surgeon: Rachael Fee, MD;  Location: [facility] ENDOSCOPY;  Service: Endoscopy;  Laterality: N/A;   ESOPHAGOGASTRODUODENOSCOPY (EGD) WITH PROPOFOL N/A 12/24/2019   Procedure: ESOPHAGOGASTRODUODENOSCOPY (EGD) WITH PROPOFOL;  Surgeon: Rachael Fee, MD;  Location: [facility] ENDOSCOPY;  Service: Endoscopy;  Laterality: N/A;   HOT HEMOSTASIS N/A 12/24/2019   Procedure: HOT HEMOSTASIS  (ARGON PLASMA COAGULATION/BICAP);  Surgeon: Rachael Fee, MD;  Location[facility] ENDOSCOPY;  Service: Endoscopy;  Laterality: N/A;   POLYPECTOMY  12/24/2019   Procedure: POLYPECTOMY;  Surgeon: Rachael Fee, MD;  Location: [facility] ENDOSCOPY;  Service: Endoscopy;;   TUBAL LIGATION  1997       Initial Focused Assessment (If applicable, or please see trauma documentation): Airway-- intact no visible obstruction Breathing-- spontaneous, labored, pain on inspiration Circulation-- no obvious bleeding noted  CT's Completed:   CT Head, CT C-Spine, CT Chest w/ contrast, and CT abdomen/pelvis w/ contrast   Interventions:  See event summary  Plan for disposition:  Admission to floor   Consults completed:  Trauma Surgery at 0525.  Event Summary: Patient arrived to department POV. Made Level 2 after initial xrays. Rib fractures identified with right sided pneumothorax and large pleural air leak. Trauma MD Azucena Cecil to bedside @ [phone]. Pigtail chest tube placed. Chest xray completed to confirm placement. Patient to CT following chest tube placement.  MTP Summary (If applicable):  N/A  Bedside handoff with ED RN Chasity.    Leota Sauers  Trauma Response RN  Please call TRN at [phone] for further assistance.

## 2023-10-26 NOTE — Progress Notes (Signed)
   10/26/23 0955  Vitals  Temp 98.5 F (36.9 C)  Temp Source Oral  BP (!) 158/89  MAP (mmHg) 111  BP Location Right Arm  BP Method Automatic  Patient Position (if appropriate) Lying  Pulse Rate 97  Pulse Rate Source Monitor  ECG Heart Rate 99  Resp 19  Level of Consciousness  Level of Consciousness Alert  MEWS COLOR  MEWS Score Color Green  Oxygen Therapy  SpO2 96 %  O2 Device Nasal Cannula  O2 Flow Rate (L/min) 6 L/min  Complaints & Interventions  Neuro symptoms relieved by Rest;Relaxation techniques (Comment) (deep breathing exercises)  PCA/Epidural/Spinal Assessment  Respiratory Pattern Regular;Symmetrical  IS - Achieved (mL) (RN, NT, or RT) 1500 mL  Glasgow Coma Scale  Eye Opening 4  Best Verbal Response (NON-intubated) 5  Best Motor Response 6  Glasgow Coma Scale Score 15  MEWS Score  MEWS Temp 0  MEWS Systolic 0  MEWS Pulse 0  MEWS RR 0  MEWS LOC 0  MEWS Score 0   Pt admitted to unit from ED with personal belongings. Pt A&Ox4, MAEx4, and verbally responsive. Skin assessment completed with second RN per hospital protocol with no pressure injuries noted, generalized bruising noted. [place] J collar on and aligned. Right chest tube with no air leak noted and connected to suction per order. Telemetry applied and CCMD called. Pt and pt daughter oriented to room, call light system, and unit. Call bell within reach with bed in lowest position and alarm on. Hosie Spangle, PA notified of pt's arrival to unit.

## 2023-10-26 NOTE — ED Notes (Signed)
 Pt is back from CT

## 2023-10-26 NOTE — Procedures (Signed)
 Chest tube insertion  Date/Time: 10/26/2023 6:33 AM  Performed by: Lysle Rubens, MD Authorized by: Lysle Rubens, MD   Consent:    Consent obtained:  Written   Consent given by:  Patient   Risks discussed:  Bleeding, incomplete drainage, damage to surrounding structures, nerve damage, infection and pain Pre-procedure details:    Skin preparation:  ChloraPrep   Preparation: Patient was prepped and draped in the usual sterile fashion   Anesthesia (see MAR for exact dosages):    Anesthesia method:  Local infiltration   Local anesthetic:  Lidocaine 1% w/o epi Procedure details:    Placement location:  L lateral   Scalpel size:  11   Technique: Seldinger     Ultrasound guidance: no     Tension pneumothorax: no     Tube connected to:  Suction   Drainage characteristics:  Air only   Suture material:  0 silk   Dressing:  4x4 sterile gauze Post-procedure details:    Post-insertion x-ray findings comment:  Tube and all drainage holes within thoracic cavity but shallow   Patient tolerance of procedure:  Tolerated well, no immediate complications Comments:     Discussed with patient that we may need to upsize to large chest tube using open procedure given drainage holes not very deep within thorax. Will obtain CT scans first since some relief of symptoms and stable. Patient voiced understanding.

## 2023-10-26 NOTE — Progress Notes (Signed)
 Chaplain responds to Level 2 trauma and provides compassionate presence as medical team provides care. No family present.

## 2023-10-26 NOTE — Plan of Care (Signed)
  Problem: Education: Goal: Knowledge of General Education information will improve Description: Including pain rating scale, medication(s)/side effects and non-pharmacologic comfort measures Outcome: Progressing   Problem: Health Behavior/Discharge Planning: Goal: Ability to manage health-related needs will improve Outcome: Progressing   Problem: Clinical Measurements: Goal: Cardiovascular complication will be avoided Outcome: Progressing   Problem: Nutrition: Goal: Adequate nutrition will be maintained Outcome: Progressing   Problem: Coping: Goal: Level of anxiety will decrease Outcome: Progressing   Problem: Elimination: Goal: Will not experience complications related to urinary retention Outcome: Progressing   Problem: Pain Managment: Goal: General experience of comfort will improve and/or be controlled Outcome: Progressing   Problem: Safety: Goal: Ability to remain free from injury will improve Outcome: Progressing

## 2023-10-26 NOTE — ED Provider Notes (Signed)
 [place] EMERGENCY DEPARTMENT AT [facility] Provider Note   CSN: [id number] Arrival date & time: 10/26/23  0343     History  Chief Complaint  Patient presents with   Denise [name]    Denise [name] is a 63 y.o. female.  Patient reports falling last night outside about 10:30 PM.  States she has dizzy spells and lost her balance.  Fell on the ground to her right side on the grass.  Has severe pain to her right ribs with painful breathing and shortness of breath.  Does have a history of COPD.  Does not think she hit her head.  Did not lose consciousness.  Complains of pain to her neck and back as well.  C-collar placed in initial evaluation.  Denies any abdominal pain or low back pain.  No focal weakness, numbness or tingling.  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 No blood thinner use.  The history is provided by the patient and a relative. The history is limited by the condition of the patient.  Fall Associated symptoms include shortness of breath. Pertinent negatives include no chest pain, no abdominal pain and no headaches.       Home Medications Prior to Admission medications   Medication Sig Start Date End Date Taking? Authorizing Provider  albuterol (VENTOLIN HFA) 108 (90 Base) MCG/ACT inhaler INHALE 2 PUFFS INTO THE LUNGS EVERY 4-6 HOURS AS NEEDED FOR TIGHTNESS/WHEEZING 04/26/23   Doreene Nest, NP  amLODipine (NORVASC) 10 MG tablet TAKE 1 TABLET BY MOUTH EVERY DAY FOR BLOOD PRESSURE 03/18/23   Doreene Nest, NP  Ascorbic Acid (VITAMIN C) 1000 MG tablet Take 1,000 mg by mouth daily.    [provider]  aspirin EC 81 MG tablet Take 162 mg by mouth daily.    [provider]  atorvastatin (LIPITOR) 80 MG tablet Take 1 tablet (80 mg total) by mouth daily. for cholesterol. 01/03/23 01/03/24  Doreene Nest, NP  diphenhydrAMINE (BENADRYL) 25 MG tablet Take 25 mg by mouth every 6 (six) hours as  needed for allergies.    [provider]  escitalopram (LEXAPRO) 20 MG tablet Take 1 tablet (20 mg total) by mouth daily. For anxiety 09/18/23   Doreene Nest, NP  famotidine (PEPCID) 20 MG tablet Take 20 mg by mouth daily as needed for heartburn or indigestion.    [provider]  ferrous gluconate (FERGON) 324 MG tablet TAKE 1 TABLET BY MOUTH DAILY WITH BREAKFAST 12/28/21   Doreene Nest, NP  fluticasone-salmeterol [facility] INHUB) 250-50 MCG/ACT AEPB INHALE 1 PUFF INTO THE LUNGS IN THE MORNING AND AT BEDTIME. 08/18/23   Doreene Nest, NP  lisinopril (ZESTRIL) 30 MG tablet TAKE 1 TABLET (30 MG TOTAL) BY MOUTH DAILY. FOR BLOOD PRESSURE. 03/14/23   Doreene Nest, NP  metoprolol tartrate (LOPRESSOR) 100 MG tablet Take 1 tablet (100 mg total) by mouth once for 1 dose. Take 90-120 minutes prior to scan. Hold for SBP less than 110. Patient not taking: Reported on 10/22/2023 09/06/23 09/06/23  Nahser, Deloris Ping, MD  pyridOXINE (VITAMIN B-6) 25 MG tablet Take 25 mg by mouth daily.    [provider]  SUMAtriptan (IMITREX) 50 MG tablet Take 1 tablet at migraine onset. May repeat in 2 hours if headache persists or recurs. Patient not taking: Reported on 10/22/2023 05/23/23   Doreene Nest, NP  vitamin B-12 (CYANOCOBALAMIN) 1000 MCG tablet Take  1,000 mcg by mouth daily.    [provider]  vitamin E 180 MG (400 UNITS) capsule Take 800 Units by mouth daily.    [provider]      Allergies    Codeine    Review of Systems   Review of Systems  Constitutional:  Negative for activity change, appetite change and fever.  HENT:  Negative for congestion.   Respiratory:  Positive for chest tightness and shortness of breath.   Cardiovascular:  Negative for chest pain.  Gastrointestinal:  Negative for abdominal pain, nausea and vomiting.  Genitourinary:  Negative for dysuria and hematuria.  Musculoskeletal:  Positive for arthralgias and myalgias.   Skin:  Negative for rash.  Neurological:  Negative for dizziness, weakness and headaches.    all other systems are negative except as noted in the HPI and PMH.   Physical Exam Updated Vital Signs BP 124/74 (BP Location: Right Arm)   Pulse 87   Temp 98.6 F (37 C) (Oral)   Resp 16   SpO2 94%  Physical Exam Vitals and nursing note reviewed.  Constitutional:      General: She is in acute distress.     Appearance: She is well-developed. She is obese.     Comments: Uncomfortable  HENT:     Head: Normocephalic and atraumatic.     Mouth/Throat:     Pharynx: No oropharyngeal exudate.  Eyes:     Conjunctiva/sclera: Conjunctivae normal.     Pupils: Pupils are equal, round, and reactive to light.     Comments: R pupil 4mm, L pupil 2 mm. Chronic per patient.   Neck:     Comments: C-collar placed on arrival. No midline C-spine pain Cardiovascular:     Rate and Rhythm: Normal rate and regular rhythm.     Heart sounds: Normal heart sounds. No murmur heard. Pulmonary:     Effort: Pulmonary effort is normal. No respiratory distress.     Breath sounds: Normal breath sounds.     Comments: Uncomfortable no respiratory distress  Right lateral rib tenderness with crepitus Chest:     Chest wall: Tenderness present.  Abdominal:     Palpations: Abdomen is soft.     Tenderness: There is no abdominal tenderness. There is no guarding or rebound.  Musculoskeletal:        General: No tenderness. Normal range of motion.     Cervical back: Normal range of motion and neck supple.     Comments: No midline T or L-spine pain.  Full range of motion of hips bilaterally  Skin:    General: Skin is warm.  Neurological:     Mental Status: She is alert and oriented to person, place, and time.     Cranial Nerves: No cranial nerve deficit.     Motor: No abnormal muscle tone.     Coordination: Coordination normal.     Comments:  5/5 strength throughout. CN 2-12 intact.Equal grip strength.    Psychiatric:        Behavior: Behavior normal.     ED Results / Procedures / Treatments   Labs (all labs ordered are listed, but only abnormal results are displayed) Labs Reviewed  CBC WITH DIFFERENTIAL/PLATELET - Abnormal; Notable for the following components:      Result Value   WBC 17.1 (*)    Hemoglobin 16.2 (*)    HCT 49.7 (*)    MCV 100.8 (*)    Neutro Abs 15.0 (*)    Lymphs Abs  0.6 (*)    Monocytes Absolute 1.2 (*)    Abs Immature Granulocytes 0.12 (*)    All other components within normal limits  COMPREHENSIVE METABOLIC PANEL - Abnormal; Notable for the following components:   Sodium 130 (*)    Chloride 97 (*)    CO2 18 (*)    Glucose, Bld 125 (*)    BUN 7 (*)    AST 66 (*)    All other components within normal limits  I-STAT CHEM 8, ED - Abnormal; Notable for the following components:   Sodium 129 (*)    BUN 7 (*)    Glucose, Bld 120 (*)    Calcium, Ion 1.03 (*)    TCO2 19 (*)    Hemoglobin 18.4 (*)    HCT 54.0 (*)    All other components within normal limits  PROTIME-INR  MAGNESIUM  PHOSPHORUS    EKG EKG Interpretation Date/Time:  Saturday October 26 2023 05:29:11 EDT Ventricular Rate:  106 PR Interval:  141 QRS Duration:  73 QT Interval:  342 QTC Calculation: 455 R Axis:   25  Text Interpretation: Sinus tachycardia Multiform ventricular premature complexes Low voltage, extremity and precordial leads No significant change was found Confirmed by Glynn Octave [id number]) on 10/26/2023 5:34:40 AM  Radiology DG Chest Portable 1 View Result Date: 10/26/2023 CLINICAL DATA:  63 year old female with right rib fractures, pneumothorax, subcutaneous emphysema. Right chest tube placed. EXAM: PORTABLE CHEST 1 VIEW COMPARISON:  0413 hours today and earlier. FINDINGS: Portable AP semi upright view at 0614 hours. Pigtail type right chest tube placed over the mid right lung. Continued extensive subcutaneous chest and neck soft tissue gas which limits right lung detail.  But the previously seen pleural edge is not identified now. Mildly lower lung volumes. Stable cardiac size and mediastinal contours. No new lung opacity. Stable visualized osseous structures. IMPRESSION: 1. Right pneumothorax no longer visible following pigtail right chest tube placement. 2. Ongoing extensive chest and neck subcutaneous emphysema. 3. Right lateral rib fractures. Electronically Signed   By: Odessa Fleming M.D.   On: 10/26/2023 06:22   DG Ribs Unilateral W/Chest Right Addendum Date: 10/26/2023 ADDENDUM REPORT: 10/26/2023 05:25 ADDENDUM: Study discussed by telephone with Dr. Glynn Octave on 10/26/2023 at 0515 hours. Electronically Signed   By: Odessa Fleming M.D.   On: 10/26/2023 05:25   Result Date: 10/26/2023 CLINICAL DATA:  63 year old female status post fall.  Pain. EXAM: RIGHT RIBS AND CHEST - 3+ VIEW COMPARISON:  Cardiac CT earlier this month 10/11/2023. FINDINGS: AP view of the chest at 0413 hours. Large volume of chest wall subcutaneous gas which is more pronounced on the right, but extends bilaterally and into the bilateral lower neck. On 2 oblique views of the right ribs displaced right lateral 4th through 7th rib fractures are noted. Decreased lung detail due to extensive subcutaneous emphysema. However, a right side pneumothorax is confirmed on images #1 and #2, best seen on image #2 with pleural edge visible down to the lateral right 5th or 6th rib level on that image. No pleural effusion or pulmonary contusion is identified. Stable lung volumes compared to earlier this month. Mediastinal contours remain within normal limits. Visualized tracheal air column is within normal limits. Negative visible bowel gas. IMPRESSION: 1. Displaced right lateral 4th through 7th rib fractures with small to moderate Right Pneumothorax and evidence of Large Pleural Air Leak: Extensive chest wall and lower neck subcutaneous emphysema. 2. No pleural effusion or pulmonary contusion identified. Electronically Signed:  By: Odessa Fleming M.D. On: 10/26/2023 05:13   DG Cervical Spine 2 or 3 views Result Date: 10/26/2023 CLINICAL DATA:  Fall. EXAM: CERVICAL SPINE - 2-3 VIEW COMPARISON:  None Available. FINDINGS: Three views study is markedly limited by bony demineralization. Shoulder position, and extensive soft tissue gas in the neck. Lateral film images from the skull base to the C4 vertebral body but does not visualized lower cervical levels. Frontal film is limited by the above described features and positioning. IMPRESSION: 1. While no acute fractures identified, study is considered nondiagnostic due to above described limitations. CT imaging recommended for definitive assessment. 2. Extensive mediastinal and soft tissue gas in the neck. I personally called these results directly to Dr. Manus Gunning at approximately 0508 hours on 10/26/2023. Electronically Signed   By: Kennith Center M.D.   On: 10/26/2023 05:08    Procedures .Critical Care  Performed by: Glynn Octave, MD Authorized by: Glynn Octave, MD   Critical care provider statement:    Critical care time (minutes):  45   Critical care time was exclusive of:  Separately billable procedures and treating other patients   Critical care was necessary to treat or prevent imminent or life-threatening deterioration of the following conditions:  Trauma   Critical care was time spent personally by me on the following activities:  Development of treatment plan with patient or surrogate, discussions with consultants, evaluation of patient's response to treatment, examination of patient, ordering and review of laboratory studies, ordering and review of radiographic studies, ordering and performing treatments and interventions, pulse oximetry, re-evaluation of patient's condition, review of old charts, blood draw for specimens and obtaining history from patient or surrogate   I assumed direction of critical care for this patient from another provider in my specialty: no      Care discussed with: admitting provider       Medications Ordered in ED Medications  HYDROmorphone (DILAUDID) injection 1 mg (has no administration in time range)    ED Course/ Medical Decision Making/ A&P                                 Medical Decision Making Amount and/or Complexity of Data Reviewed Labs: ordered. Decision-making details documented in ED Course. Radiology: ordered and independent interpretation performed. Decision-making details documented in ED Course. ECG/medicine tests: ordered and independent interpretation performed. Decision-making details documented in ED Course.  Risk Prescription drug management.   Patient reports falling at home last evening about 10 PM after losing her balance.  Does have issues with dizziness.  Denies hitting head but landed on her right side.  GCS is 15, ABCs are intact.  Equal breath sounds bilaterally.  Does have significant crepitus to right chest wall however.  X-ray obtained in triage is remarkable for multiple displaced rib fractures with subcutaneous air and moderate pneumothorax.  Discussed with Dr. Molli Posey as well as Dr. Margo Aye.  Level 2 trauma activated.  Patient placed on monitor.  C-collar placed on initial assessment.  GCS is 15, ABCs are intact.  No increased work of breathing or hypoxia on room air.  Does have a history of COPD.  Rib fractures and pneumothorax discussed with Dr. Azucena Cecil and trauma surgery prior to CT scan.  Dr. Azucena Cecil feels patient has been stable since 10 PM and is not hypoxic or any respiratory distress.  She feels patient can have CT scans and further evaluation by trauma surgery. She does  not feel chest tube was necessary prior to CT scan.  Dr. Azucena Cecil decided to place chest tube prior to CT scan.  She did place pigtail catheter which is tentatively placed given amount of subcu air and soft tissue.  May require further bigger chest tube.  Trauma scans will be obtained.  Trauma service will admit  patient after shift change  Patient remained stable after chest tube placement.  Pending trauma CT scans.  Plan admission to trauma service after shift change after completion of CT scans.  Care to be transferred at shift change to Dr. Deretha Emory.       Final Clinical Impression(s) / ED Diagnoses Final diagnoses:  Fall, initial encounter  Closed fracture of multiple ribs of right side, initial encounter  Traumatic pneumothorax, initial encounter    Rx / DC Orders ED Discharge Orders     None         Landry Lookingbill, Jeannett Senior, MD 10/26/23 [phone]

## 2023-10-26 NOTE — Progress Notes (Signed)
 Transition of Care [facility]) - CAGE-AID Screening   Patient Details  Name: Denise [name] MRN: [id number] Date of Birth: August 26, 1960  Transition of Care [facility]) CM/SW Contact:    Katha Hamming, RN Phone Number: 10/26/2023, 8:12 PM   Clinical Narrative:  Reports drinking wine at night, reports no withdrawal symptoms, declines resources.  CAGE-AID Screening:    Have You Ever Felt You Ought to Cut Down on Your Drinking or Drug Use?: No Have People Annoyed You By Critizing Your Drinking Or Drug Use?: No Have You Felt Bad Or Guilty About Your Drinking Or Drug Use?: No Have You Ever Had a Drink or Used Drugs First Thing In The Morning to Steady Your Nerves or to Get Rid of a Hangover?: No CAGE-AID Score: 0  Substance Abuse Education Offered: No

## 2023-10-26 NOTE — Consult Note (Signed)
 Initial Consultation Note   Patient: Denise [name] ZOX:[id number] DOB: May 14, 1961 PCP: Doreene Nest, NP DOA: 10/26/2023 DOS: the patient was seen and examined on 10/26/2023 Primary service: Md, Trauma, MD  Referring physician: Dr. Azucena Cecil Reason for consult: Assist with management of chronic comorbidities   Assessment and Plan: Principal Problem:   Traumatic pneumothorax Active Problems:   Hypertension   Hyponatremia   COPD (chronic obstructive pulmonary disease) (HCC)   Anxiety and depression   CAD (coronary artery disease)   Carotid artery disease (HCC)   Fall at home, initial encounter   High anion gap metabolic acidosis   Elevated AST (SGOT)   Closed fracture dislocation of thoracic spine (HCC)   Multiple fractures of ribs, right side, initial encounter for closed fracture   Fall at home: Larey Seat off 3 feet high porch to grass ground.  Patient with poor right eye vision, depth perception and dizziness due to carotid artery stenosis. 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  Aspiration: CT angio chest shows confluent airspace opacity in RLL and early involvement in RML concerning for aspiration. -Monitor off antibiotics -Pulmonary toilet -Aspiration precaution  Symptomatic carotid artery stenosis: Patient reports poor vision with poor depth perception and right eye and intermittent dizziness that she attributes to this.  She is scheduled for CEA by vascular surgery on 11/01/2023. -Recommend involving vascular surgery if this can be addressed during this hospitalization -Continue aspirin and statin  Secondary polycythemia: Likely due to dehydration -IV fluid hydration -Recheck in the morning  Chronic COPD: Stable. -Will do scheduled and as needed nebulizers  History of CAD: No anginal symptoms.  Coronary CT on 3/9  with hemodynamically insignificant CAD. -Continue home aspirin, Lipitor and lisinopril.  Essential hypertension: BP slightly elevated partly due to pain and IV fluid. -Pain control -Resume home antihypertensive meds after med rec  Hyponatremia: Mild.  Multifactorial including dehydration as evidenced by hemoconcentration.she is also on lisinopril and Celexa which could contribute.  TSH normal. -Check urine chemistry  Elevated LFT: Mild -Check CK  Anxiety and depression: Stable -Continue home Celexa although this could be contributing to hyponatremia.  Lung nodule: known slowly enlarging LLL nodule.  Patient with 20-pack-year history before she quit in 05/2023. -Outpatient follow-up.  Aortic atherosclerosis: Good DP pulses bilaterally. -Aspirin and statin as above.  Osteopenia: Noted on CT. -Check vitamin D level -Needs DEXA scan outpatient   TRH will continue to follow the patient.  HPI:  63 year old F with PMH of CAD, carotid artery stenosis, COPD, HTN, osteoarthritis, anxiety, depression, migraine and prior tobacco use brought to ED after accidental fall off her porch to grass ground last night.  Patient with history of symptomatic carotid artery stenosis with poor vision and depth perception and intermittent dizziness.  She is scheduled for right endarterectomy on 11/01/2023.  She missed a step and fell off a 3 feet porch to grass ground about 10:30 PM last night.  She denies losing consciousness.  She felt right rib cage, lower back and posterior neck pain afterward.  She denies new vision change, focal weakness or numbness.  Her pain was severe but improved to 3/10 after pain medication now.  She denies shortness of breath but difficult to take deep breathing due to pain.  She reports productive cough but has not looked at the color of the phlegm.  She denies fever, chills, substernal chest pain, GI or UTI symptoms.  She quit smoking in 05/2023.  She smoked about a pack a day for  about 20 years.  Admits to the glass of wine every night to help with sleep.  Denies recreational drug use.  She is interested in cardiopulmonary resuscitation in event of sudden cardiopulmonary arrest.  In ED, stable vitals.  Saturating at 94% on RA but started on 6 L after chest tube placement.  CXR showed right pneumothorax. Na 130.  CO2 18.  AG 15.  AST 66.  WBC 17.1 with left shift.  Hgb 16.2.  MCV 100.8.  CT head and neck suggested osteopenia, mediastinal and soft tissue gas including cutaneous emphysema in her right neck.  CT chest, abdomen and pelvis showed 5th through 7th rib fractures with 6 rib fractures into 2 places, chest tube with trace residual right PTX, emphysema with large air leak, extensive chest and body wall subcutaneous gas, acute right L4 transverse process fracture and mild L4 superior endplate compression fracture with 10% height loss without retropulsion, confluent airspace opacities in right lower lobe, early involvement of right middle lobe and opacified lower lobe airways suspicious for acute aspiration, trace pleural effusion/hemothorax, stable suspicious LLL lung nodule and very severe aortic atherosclerosis.  Patient was given some analgesics and IV calcium gluconate.  Chest tube placed.  Did by trauma surgery.  Consultation requested for management of comorbidities.  Review of Systems: As mentioned in the history of present illness. All other systems reviewed and are negative. Past Medical History:  Diagnosis Date   Allergy    Anemia 12/24/2019   slow Colon bleed   Anxiety    Arthritis    BMI 35.0-35.9,adult 03/31/2012   Chickenpox    Cholecystitis with cholelithiasis 03/31/2012   COPD (chronic obstructive pulmonary disease) (HCC)    Coronary artery disease    Nonobstructive   Depression    GERD (gastroesophageal reflux disease)    Headache    Heart murmur    Hypertension 03/31/2012   Peripheral vascular disease (HCC)    Right Carotid Artery Stenosis    Pneumonia    Shingles    Past Surgical History:  Procedure Laterality Date   BIOPSY  12/24/2019   Procedure: BIOPSY;  Surgeon: Rachael Fee, MD;  Location: [facility] ENDOSCOPY;  Service: Endoscopy;;   CATARACT EXTRACTION Bilateral 2021   CHOLECYSTECTOMY  03/30/2012   Procedure: LAPAROSCOPIC CHOLECYSTECTOMY WITH INTRAOPERATIVE CHOLANGIOGRAM;  Surgeon: Mariella Saa, MD;  Location: [facility] ORS;  Service: General;  Laterality: N/A;   COLONOSCOPY WITH PROPOFOL N/A 12/24/2019   Procedure: COLONOSCOPY WITH PROPOFOL;  Surgeon: Rachael Fee, MD;  Location[facility] ENDOSCOPY;  Service: Endoscopy;  Laterality: N/A;   ESOPHAGOGASTRODUODENOSCOPY (EGD) WITH PROPOFOL N/A 12/24/2019   Procedure: ESOPHAGOGASTRODUODENOSCOPY (EGD) WITH PROPOFOL;  Surgeon: Rachael Fee, MD;  Location: [facility] ENDOSCOPY;  Service: Endoscopy;  Laterality: N/A;   HOT HEMOSTASIS N/A 12/24/2019   Procedure: HOT HEMOSTASIS (ARGON PLASMA COAGULATION/BICAP);  Surgeon: Rachael Fee, MD;  Location: [facility] ENDOSCOPY;  Service: Endoscopy;  Laterality: N/A;   POLYPECTOMY  12/24/2019   Procedure: POLYPECTOMY;  Surgeon: Rachael Fee, MD;  Location: [facility] ENDOSCOPY;  Service: Endoscopy;;   TUBAL LIGATION  1997   Social History:  reports that she quit smoking about 5 months ago. Her smoking use included cigarettes. She started smoking about 38 years ago. She has a 28.5 pack-year smoking history. She has never used smokeless tobacco. She reports current alcohol use. She reports that she does not use drugs.  Allergies  Allergen Reactions  Codeine Itching    Family History  Problem Relation Age of Onset   Arthritis Mother    Cervical cancer Mother    Heart disease Father    Hypertension Father     Prior to Admission medications   Medication Sig Start Date End Date Taking? Authorizing Provider  albuterol (VENTOLIN HFA) 108 (90 Base) MCG/ACT inhaler INHALE 2 PUFFS INTO THE LUNGS EVERY 4-6 HOURS AS NEEDED FOR TIGHTNESS/WHEEZING 04/26/23    Doreene Nest, NP  amLODipine (NORVASC) 10 MG tablet TAKE 1 TABLET BY MOUTH EVERY DAY FOR BLOOD PRESSURE 03/18/23   Doreene Nest, NP  Ascorbic Acid (VITAMIN C) 1000 MG tablet Take 1,000 mg by mouth daily.    [provider]  aspirin EC 81 MG tablet Take 162 mg by mouth daily.    [provider]  atorvastatin (LIPITOR) 80 MG tablet Take 1 tablet (80 mg total) by mouth daily. for cholesterol. 01/03/23 01/03/24  Doreene Nest, NP  diphenhydrAMINE (BENADRYL) 25 MG tablet Take 25 mg by mouth every 6 (six) hours as needed for allergies.    [provider]  escitalopram (LEXAPRO) 20 MG tablet Take 1 tablet (20 mg total) by mouth daily. For anxiety 09/18/23   Doreene Nest, NP  famotidine (PEPCID) 20 MG tablet Take 20 mg by mouth daily as needed for heartburn or indigestion.    [provider]  ferrous gluconate (FERGON) 324 MG tablet TAKE 1 TABLET BY MOUTH DAILY WITH BREAKFAST 12/28/21   Doreene Nest, NP  fluticasone-salmeterol [facility] INHUB) 250-50 MCG/ACT AEPB INHALE 1 PUFF INTO THE LUNGS IN THE MORNING AND AT BEDTIME. 08/18/23   Doreene Nest, NP  lisinopril (ZESTRIL) 30 MG tablet TAKE 1 TABLET (30 MG TOTAL) BY MOUTH DAILY. FOR BLOOD PRESSURE. 03/14/23   Doreene Nest, NP  metoprolol tartrate (LOPRESSOR) 100 MG tablet Take 1 tablet (100 mg total) by mouth once for 1 dose. Take 90-120 minutes prior to scan. Hold for SBP less than 110. Patient not taking: Reported on 10/22/2023 09/06/23 09/06/23  Nahser, Deloris Ping, MD  pyridOXINE (VITAMIN B-6) 25 MG tablet Take 25 mg by mouth daily.    [provider]  SUMAtriptan (IMITREX) 50 MG tablet Take 1 tablet at migraine onset. May repeat in 2 hours if headache persists or recurs. Patient not taking: Reported on 10/22/2023 05/23/23   Doreene Nest, NP  vitamin B-12 (CYANOCOBALAMIN) 1000 MCG tablet Take 1,000 mcg by mouth daily.    [provider]  vitamin E 180 MG (400 UNITS)  capsule Take 800 Units by mouth daily.    [provider]    Physical Exam: Vitals:   10/26/23 0915 10/26/23 0938 10/26/23 0955 10/26/23 1130  BP: (!) 152/80  (!) 158/89 (!) 151/80  Pulse: 75 86 97   Resp: 15 18 19    Temp:   98.5 F (36.9 C) 98.3 F (36.8 C)  TempSrc:   Oral Oral  SpO2: 100% 100% 96%   Weight:      Height:       GENERAL: No apparent distress.  Nontoxic. HEENT: MMM.  Hearing grossly intact.  Poor vision in right eye. NECK: Supple.  No apparent JVD.  Crepitus in right neck. RESP:  No IWOB.  Fair aeration bilaterally.  Chest tube to right chest.  Crepitus in right chest. CVS:  RRR. Heart sounds normal.  ABD/GI/GU: BS+. Abd soft, NTND.  MSK/EXT:   No apparent deformity. Moves extremities. No edema.  SKIN:  no apparent skin lesion or wound NEURO: Awake and alert. Oriented appropriately.  No apparent focal neuro deficit. PSYCH: Calm. Normal affect.  Data Reviewed:  See HPI   Family Communication: Updated patient's daughter at bedside Primary team communication: Updated through secure chat Thank you very much for involving [place] in the care of your patient.  Author: Almon Hercules, MD 10/26/2023 12:19 PM  For on call review [email or web address].

## 2023-10-26 NOTE — ED Triage Notes (Signed)
 Patient lost her balance and fell at home this evening , denies LOC/ambulatory , respirations unlabored , reports pain at right ribcage , lower back and posterior neck . No anticoagulant medication .

## 2023-10-26 NOTE — ED Notes (Signed)
 MD at bedside, xray confirmed placement of chest tube.

## 2023-10-26 NOTE — ED Notes (Addendum)
 CCMD notified , patient is on monitor.

## 2023-10-27 ENCOUNTER — Inpatient Hospital Stay (HOSPITAL_COMMUNITY)

## 2023-10-27 DIAGNOSIS — E8729 Other acidosis: Secondary | ICD-10-CM | POA: Diagnosis not present

## 2023-10-27 DIAGNOSIS — Y92009 Unspecified place in unspecified non-institutional (private) residence as the place of occurrence of the external cause: Secondary | ICD-10-CM

## 2023-10-27 DIAGNOSIS — S270XXA Traumatic pneumothorax, initial encounter: Secondary | ICD-10-CM | POA: Diagnosis not present

## 2023-10-27 DIAGNOSIS — I2510 Atherosclerotic heart disease of native coronary artery without angina pectoris: Secondary | ICD-10-CM | POA: Diagnosis not present

## 2023-10-27 DIAGNOSIS — W19XXXA Unspecified fall, initial encounter: Secondary | ICD-10-CM | POA: Diagnosis not present

## 2023-10-27 LAB — CBC
HCT: 42.6 % (ref 36.0–46.0)
Hemoglobin: 14.2 g/dL (ref 12.0–15.0)
MCH: 33.2 pg (ref 26.0–34.0)
MCHC: 33.3 g/dL (ref 30.0–36.0)
MCV: 99.5 fL (ref 80.0–100.0)
Platelets: 236 10*3/uL (ref 150–400)
RBC: 4.28 MIL/uL (ref 3.87–5.11)
RDW: 14.6 % (ref 11.5–15.5)
WBC: 12.1 10*3/uL — ABNORMAL HIGH (ref 4.0–10.5)
nRBC: 0 % (ref 0.0–0.2)

## 2023-10-27 LAB — COMPREHENSIVE METABOLIC PANEL
ALT: 68 U/L — ABNORMAL HIGH (ref 0–44)
AST: 87 U/L — ABNORMAL HIGH (ref 15–41)
Albumin: 3.1 g/dL — ABNORMAL LOW (ref 3.5–5.0)
Alkaline Phosphatase: 104 U/L (ref 38–126)
Anion gap: 10 (ref 5–15)
BUN: 9 mg/dL (ref 8–23)
CO2: 21 mmol/L — ABNORMAL LOW (ref 22–32)
Calcium: 8.9 mg/dL (ref 8.9–10.3)
Chloride: 94 mmol/L — ABNORMAL LOW (ref 98–111)
Creatinine, Ser: 0.63 mg/dL (ref 0.44–1.00)
GFR, Estimated: >60 mL/min (ref >60)
Glucose, Bld: 112 mg/dL — ABNORMAL HIGH (ref 70–99)
Potassium: 4 mmol/L (ref 3.5–5.1)
Sodium: 125 mmol/L — ABNORMAL LOW (ref 135–145)
Total Bilirubin: 0.9 mg/dL (ref 0.0–1.2)
Total Protein: 5.9 g/dL — ABNORMAL LOW (ref 6.5–8.1)

## 2023-10-27 LAB — BASIC METABOLIC PANEL
Anion gap: 8 (ref 5–15)
BUN: 10 mg/dL (ref 8–23)
CO2: 23 mmol/L (ref 22–32)
Calcium: 8.7 mg/dL — ABNORMAL LOW (ref 8.9–10.3)
Chloride: 92 mmol/L — ABNORMAL LOW (ref 98–111)
Creatinine, Ser: 0.57 mg/dL (ref 0.44–1.00)
GFR, Estimated: >60 mL/min (ref >60)
Glucose, Bld: 93 mg/dL (ref 70–99)
Potassium: 4.4 mmol/L (ref 3.5–5.1)
Sodium: 123 mmol/L — ABNORMAL LOW (ref 135–145)

## 2023-10-27 LAB — CK: Total CK: 422 U/L — ABNORMAL HIGH (ref 38–234)

## 2023-10-27 LAB — MAGNESIUM: Magnesium: 1.8 mg/dL (ref 1.7–2.4)

## 2023-10-27 MED ORDER — SODIUM CHLORIDE 1 G PO TABS
1.0000 g | ORAL_TABLET | Freq: Three times a day (TID) | ORAL | Status: DC
  Administered 2023-10-27 – 2023-10-31 (×14): 1 g via ORAL
  Filled 2023-10-27 (×14): qty 1

## 2023-10-27 MED ORDER — ASPIRIN 81 MG PO TBEC: 81.0000 mg | DELAYED_RELEASE_TABLET | Freq: Every day | ORAL | Status: DC

## 2023-10-27 NOTE — Progress Notes (Signed)
 Traumatic pneumothorax  Subjective: Having some lower back pain, right sided chest pain- controlled with current meds  Objective: Vital signs in last 24 hours: Temp:  [97.6 F (36.4 C)-98.5 F (36.9 C)] 98 F (36.7 C) (03/23 0725) Pulse Rate:  [79-97] 84 (03/23 0725) Resp:  [15-21] 21 (03/23 0725) BP: (132-158)/(78-106) 151/87 (03/23 0725) SpO2:  [91 %-100 %] 95 % (03/23 0725) Last BM Date : 10/26/23  Intake/Output from previous day: 03/22 0701 - 03/23 0700 In: 830.7 [I.V.:830.7] Out: 310 [Urine:300; Chest Tube:10] Intake/Output this shift: No intake/output data recorded.  General appearance: alert and cooperative Resp: non-labored breathing on 6L [place], good cough, no leak on chest tube off suction  Lab Results:  Results for orders placed or performed during the hospital encounter of 10/26/23 (from the past 24 hours)  TSH     Status: None   Collection Time: 10/26/23 10:00 AM  Result Value Ref Range   TSH 1.043 0.350 - 4.500 uIU/mL  VITAMIN D 25 Hydroxy (Vit-D Deficiency, Fractures)     Status: Abnormal   Collection Time: 10/26/23 10:00 AM  Result Value Ref Range   Vit D, 25-Hydroxy 8.28 (L) 30 - 100 ng/mL  Sodium, urine, random     Status: None   Collection Time: 10/26/23  1:45 PM  Result Value Ref Range   Sodium, Ur 14 mmol/L  Osmolality, urine     Status: None   Collection Time: 10/26/23  1:45 PM  Result Value Ref Range   Osmolality, Ur 498 300 - 900 mOsm/kg  CBC     Status: Abnormal   Collection Time: 10/27/23  4:37 AM  Result Value Ref Range   WBC 12.1 (H) 4.0 - 10.5 K/uL   RBC 4.28 3.87 - 5.11 MIL/uL   Hemoglobin 14.2 12.0 - 15.0 g/dL   HCT 62.1 30.8 - 65.7 %   MCV 99.5 80.0 - 100.0 fL   MCH 33.2 26.0 - 34.0 pg   MCHC 33.3 30.0 - 36.0 g/dL   RDW 84.6 96.2 - 95.2 %   Platelets 236 150 - 400 K/uL   nRBC 0.0 0.0 - 0.2 %  Basic metabolic panel     Status: Abnormal   Collection Time: 10/27/23  4:37 AM  Result Value Ref Range   Sodium 123 (L) 135 - 145 mmol/L    Potassium 4.4 3.5 - 5.1 mmol/L   Chloride 92 (L) 98 - 111 mmol/L   CO2 23 22 - 32 mmol/L   Glucose, Bld 93 70 - 99 mg/dL   BUN 10 8 - 23 mg/dL   Creatinine, Ser 8.41 0.44 - 1.00 mg/dL   Calcium 8.7 (L) 8.9 - 10.3 mg/dL   GFR, Estimated >32 >44 mL/min   Anion gap 8 5 - 15     Studies/Results Radiology     MEDS, Scheduled  acetaminophen  1,000 mg Oral Q6H   amLODipine  10 mg Oral Daily   arformoterol  15 mcg Nebulization BID   [START ON 10/28/2023] aspirin EC  81 mg Oral Daily   atorvastatin  80 mg Oral Daily   budesonide (PULMICORT) nebulizer solution  0.5 mg Nebulization BID   cyanocobalamin  1,000 mcg Oral Daily   enoxaparin (LOVENOX) injection  30 mg Subcutaneous Q12H   gabapentin  300 mg Oral TID   ketorolac  15 mg Intravenous Q6H   lidocaine  3 patch Transdermal Q24H   revefenacin  175 mcg Nebulization Daily   sodium chloride  1 g Oral TID WC  Vitamin D (Ergocalciferol)  50,000 Units Oral Q7 days   CXR with no ptx  Assessment: Traumatic pneumothorax R sided rib fractures 5-7 L4 TP fx, mild sup endplate fx    Plan: Traumatic pneumothorax: Chest tube to water seal, repeat CXR in AM  R sided rib fractures 5-7: multimodality pain management   L4 TP fx, mild sup endplate fx: multimodality pain management   Aortic stenosis, 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   10/27/2023 9:35 AM

## 2023-10-27 NOTE — Progress Notes (Signed)
 PROGRESS NOTE  Denise [name] RUE:[id number] DOB: 1961-04-01   PCP: Doreene Nest, NP  Patient is from: Home.  Lives with family.  DOA: 10/26/2023 LOS: 1  Chief complaints Chief Complaint  Patient presents with   Fall     Brief Narrative / Interim history: 63 year old F with PMH of CAD, carotid artery stenosis, COPD, HTN, osteoarthritis, anxiety, depression, migraine, lung nodule and prior tobacco use brought to ED after accidental fall off her porch to grass ground, and admitted by trauma team due to traumatic right pneumothorax with multiple right-sided rib fractures and thoracic spine fractures.  Hospitalist service consulted for medical management including hyponatremia and other underlying comorbidities.  Subjective: Seen and examined earlier this morning.  No major events overnight of this morning.  Feels better but still with significant right-sided chest pain especially with deep breathing.  She rates her pain 7/10.  She just received pain medication.  No shortness of breath or substernal chest pain.  Sodium low despite IV normal saline.   Objective: Vitals:   10/26/23 2314 10/27/23 0304 10/27/23 0725 10/27/23 1140  BP: (!) 144/87 132/82 (!) 151/87 138/69  Pulse: 85 86 84   Resp: 18 15 (!) 21 20  Temp: 97.6 F (36.4 C) 97.7 F (36.5 C) 98 F (36.7 C) 97.6 F (36.4 C)  TempSrc: Oral Oral Oral Oral  SpO2: 91% 95% 95%   Weight:      Height:        Examination:  GENERAL: No apparent distress.  Nontoxic. HEENT: MMM.  Vision and hearing grossly intact.  NECK: Supple.  No apparent JVD.  RESP:  No IWOB.  Fair aeration bilaterally.  Chest tube to right chest. CVS:  RRR. Heart sounds normal.  ABD/GI/GU: BS+. Abd soft, NTND.  MSK/EXT:  Moves extremities. No apparent deformity. No edema.  Crepitus over right chest. SKIN: no apparent skin lesion or wound NEURO: Awake, alert and oriented appropriately.  No apparent focal neuro deficit. PSYCH: Calm. Normal affect.    Consultants:  TRH  Procedures: 3/22-chest tube placement  Microbiology summarized: None  Assessment and plan: Fall at home: Larey Seat off 3 feet high porch to grass ground.  Patient with poor right eye vision, depth perception and dizziness due to carotid artery stenosis. 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  Subcutaneous gas decreased. -Per trauma team/primary team-chest tube to waterseal and repeat chest x-ray in the morning   Aspiration pneumonitis: CT angio chest shows confluent airspace opacity in RLL and early involvement in RML.  Doubt pneumonia.  No respiratory distress. -Monitor off antibiotics -Pulmonary toilet -Aspiration precaution   Symptomatic carotid artery stenosis: Patient reports poor vision with poor depth perception and right eye and intermittent dizziness that she attributes to this.  She is scheduled for CEA by vascular surgery on 11/01/2023. -Primary team notified vascular surgery. -Continue aspirin and statin   Secondary polycythemia: Likely due to dehydration.  Resolved.  Hyponatremia: Sodium trended from 130-123 after IV NS raising concern for SIADH but urine sodium only 14.  Urine osmolality elevated to 498.  TSH normal.  She has lung nodule.  Also Lexapro and lisinopril at home. -Discontinue Lexapro and IV fluid.  Continue holding lisinopril. -Start p.o. sodium chloride 1 g 3 times daily -Recheck renal function in the afternoon.  If no improvement or worse, will start fluid restriction  Chronic COPD: Stable. -  Continue scheduled and as needed nebulizers.   History of CAD: No anginal symptoms.  Coronary CT on 3/9 with hemodynamically insignificant CAD. -Continue home aspirin and Lipitor.   Essential hypertension: BP slightly elevated partly due to pain and IV  fluid. -Pain control -Continue home amlodipine -Holding lisinopril in the setting of hyponatremia   Elevated LFT: Mild -Check CK   Anxiety and depression: Stable -Holding Celexa due to hyponatremia   Lung nodule: known slowly enlarging LLL nodule.  Patient with 20-pack-year history before she quit in 05/2023. -Outpatient follow-up.   Aortic atherosclerosis: Good DP pulses bilaterally. -Aspirin and statin as above.   Osteopenia: Noted on CT. -Vitamin D supplementation -Needs DEXA scan outpatient  Severe vitamin D deficiency: Vitamin D level 8.28. -Started vitamin D 50,000 units weekly  Class I obesity Body mass index is 30.56 kg/m.           DVT prophylaxis:  enoxaparin (LOVENOX) injection 30 mg Start: 10/26/23 0800  Code Status: Full code Family Communication: None at bedside Level of care: Progressive Status is: Inpatient   Disposition: Remains inpatient   55 minutes with more than 50% spent in reviewing records, counseling patient/family and coordinating care.   Sch Meds:  Scheduled Meds:  acetaminophen  1,000 mg Oral Q6H   amLODipine  10 mg Oral Daily   arformoterol  15 mcg Nebulization BID   [START ON 10/28/2023] aspirin EC  81 mg Oral Daily   atorvastatin  80 mg Oral Daily   budesonide (PULMICORT) nebulizer solution  0.5 mg Nebulization BID   cyanocobalamin  1,000 mcg Oral Daily   enoxaparin (LOVENOX) injection  30 mg Subcutaneous Q12H   gabapentin  300 mg Oral TID   ketorolac  15 mg Intravenous Q6H   lidocaine  3 patch Transdermal Q24H   revefenacin  175 mcg Nebulization Daily   sodium chloride  1 g Oral TID WC   Vitamin D (Ergocalciferol)  50,000 Units Oral Q7 days   Continuous Infusions: PRN Meds:.famotidine, HYDROmorphone (DILAUDID) injection, ipratropium-albuterol, labetalol, oxyCODONE  Antimicrobials: Anti-infectives (From admission, onward)    None        I have personally reviewed the following labs and images: CBC: Recent  Labs  Lab 10/24/23 0915 10/26/23 0541 10/26/23 0542 10/27/23 0437  WBC 11.4* 17.1*  --  12.1*  NEUTROABS  --  15.0*  --   --   HGB 16.5* 16.2* 18.4* 14.2  HCT 51.6* 49.7* 54.0* 42.6  MCV 102.2* 100.8*  --  99.5  PLT 337 311  --  236   BMP &GFR Recent Labs  Lab 10/24/23 0915 10/26/23 0541 10/26/23 0542 10/27/23 0437  NA 135 130* 129* 123*  K 4.5 4.0 4.0 4.4  CL 96* 97* 101 92*  CO2 26 18*  --  23  GLUCOSE 111* 125* 120* 93  BUN 5* 7* 7* 10  CREATININE 0.73 0.78 0.70 0.57  CALCIUM 9.2 9.1  --  8.7*  MG  --  1.7  --   --   PHOS  --  3.3  --   --    Estimated Creatinine Clearance: 72.2 mL/min (by C-G formula based on SCr of 0.57 mg/dL). Liver & Pancreas: Recent Labs  Lab 10/24/23 0915 10/26/23 0541  AST 34 66*  ALT 28 39  ALKPHOS 83 79  BILITOT 0.8 0.5  PROT 7.1 6.7  ALBUMIN 3.8 3.7   No results for input(s): "LIPASE", "AMYLASE" in the last 168 hours. No results for input(s): "AMMONIA" in the  last 168 hours. Diabetic: No results for input(s): "HGBA1C" in the last 72 hours. No results for input(s): "GLUCAP" in the last 168 hours. Cardiac Enzymes: No results for input(s): "CKTOTAL", "CKMB", "CKMBINDEX", "TROPONINI" in the last 168 hours. No results for input(s): "PROBNP" in the last 8760 hours. Coagulation Profile: Recent Labs  Lab 10/24/23 0915 10/26/23 0541  INR 0.9 0.9   Thyroid Function Tests: Recent Labs    10/26/23 1000  TSH 1.043   Lipid Profile: No results for input(s): "CHOL", "HDL", "LDLCALC", "TRIG", "CHOLHDL", "LDLDIRECT" in the last 72 hours. Anemia Panel: No results for input(s): "VITAMINB12", "FOLATE", "FERRITIN", "TIBC", "IRON", "RETICCTPCT" in the last 72 hours. Urine analysis:    Component Value Date/Time   COLORURINE AMBER (A) 10/24/2023 0942   APPEARANCEUR CLEAR 10/24/2023 0942   LABSPEC 1.017 10/24/2023 0942   PHURINE 5.0 10/24/2023 0942   GLUCOSEU NEGATIVE 10/24/2023 0942   HGBUR NEGATIVE 10/24/2023 0942   BILIRUBINUR  NEGATIVE 10/24/2023 0942   BILIRUBINUR neg 12/27/2022 0955   KETONESUR NEGATIVE 10/24/2023 0942   PROTEINUR NEGATIVE 10/24/2023 0942   UROBILINOGEN 0.2 12/27/2022 0955   UROBILINOGEN 0.2 03/29/2012 2003   NITRITE NEGATIVE 10/24/2023 0942   LEUKOCYTESUR NEGATIVE 10/24/2023 0942   Sepsis Labs: Invalid input(s): "PROCALCITONIN", "LACTICIDVEN"  Microbiology: Recent Results (from the past 240 hours)  Surgical pcr screen     Status: None   Collection Time: 10/24/23  8:59 AM   Specimen: Nasal Mucosa; Nasal Swab  Result Value Ref Range Status   MRSA, PCR NEGATIVE NEGATIVE Final   Staphylococcus aureus NEGATIVE NEGATIVE Final    Comment: (NOTE) The Xpert SA Assay (FDA approved for NASAL specimens in patients 34 years of age and older), is one component of a comprehensive surveillance program. It is not intended to diagnose infection nor to guide or monitor treatment. Performed at [facility], [address], [place], [place] [address]     Radiology Studies: DG CHEST PORT 1 VIEW Result Date: 10/27/2023 CLINICAL DATA:  Pneumothorax. EXAM: PORTABLE CHEST 1 VIEW COMPARISON:  10/26/2023 FINDINGS: The cardio pericardial silhouette is enlarged. Similar asymmetric elevation right hemidiaphragm. Interstitial markings are diffusely coarsened with chronic features. Right pleural drain again noted and may have been pulled back slightly in the interval. No discernible right-sided pneumothorax at this time. Bones are diffusely demineralized. Extensive soft tissue gas is seen through the lower neck and bilateral chest wall but appears decreased in the interval. IMPRESSION: 1. Right pleural drain may have been pulled back slightly in the interval. No discernible right-sided pneumothorax at this time. 2. Extensive soft tissue gas through the lower neck and bilateral chest wall but appears decreased in the interval. Electronically Signed   By: Kennith Center M.D.   On: 10/27/2023 06:36   DG Lumbar Spine  Complete Result Date: 10/26/2023 CLINICAL DATA:  Fall with pain to the right ribs EXAM: LUMBAR SPINE - COMPLETE 4+ VIEW COMPARISON:  CT 10/26/2023 FINDINGS: Limited by habitus. Extensive soft tissue emphysema. Mild superior endplate deformity at L4 similar compared to CT earlier today. Remaining vertebra demonstrate normal stature. Moderate disc space narrowing at L5-S1 and advanced disc space narrowing at L4-L5. Right transverse process fracture at L4 poorly visible due to habitus and soft tissues. Aortic atherosclerosis. IMPRESSION: Limited by habitus and soft tissues. Mild superior endplate deformity at L4 similar compared to CT earlier today. Right transverse process fracture at L4 poorly visible due to habitus and soft tissues. Degenerative changes most notable at L4-L5 Electronically Signed  By: Jasmine Pang M.D.   On: 10/26/2023 16:18      Brandi Armato T. Johnathen Testa [facility]  If 7PM-7AM, please contact night-coverage [email or web address] 10/27/2023, 1:32 PM

## 2023-10-27 NOTE — Plan of Care (Signed)

## 2023-10-28 ENCOUNTER — Telehealth: Payer: Self-pay | Admitting: Cardiovascular Disease

## 2023-10-28 ENCOUNTER — Encounter (HOSPITAL_COMMUNITY): Payer: Self-pay

## 2023-10-28 ENCOUNTER — Inpatient Hospital Stay (HOSPITAL_COMMUNITY)

## 2023-10-28 ENCOUNTER — Telehealth: Payer: Self-pay

## 2023-10-28 DIAGNOSIS — I2510 Atherosclerotic heart disease of native coronary artery without angina pectoris: Secondary | ICD-10-CM | POA: Diagnosis not present

## 2023-10-28 DIAGNOSIS — W19XXXA Unspecified fall, initial encounter: Secondary | ICD-10-CM | POA: Diagnosis not present

## 2023-10-28 DIAGNOSIS — E8729 Other acidosis: Secondary | ICD-10-CM | POA: Diagnosis not present

## 2023-10-28 DIAGNOSIS — S270XXA Traumatic pneumothorax, initial encounter: Secondary | ICD-10-CM | POA: Diagnosis not present

## 2023-10-28 LAB — CBC
HCT: 30.5 % — ABNORMAL LOW (ref 36.0–46.0)
HCT: 28.9 % — ABNORMAL LOW (ref 36.0–46.0)
Hemoglobin: 10.1 g/dL — ABNORMAL LOW (ref 12.0–15.0)
Hemoglobin: 9.5 g/dL — ABNORMAL LOW (ref 12.0–15.0)
MCH: 33.6 pg (ref 26.0–34.0)
MCH: 32.6 pg (ref 26.0–34.0)
MCHC: 33.1 g/dL (ref 30.0–36.0)
MCHC: 32.9 g/dL (ref 30.0–36.0)
MCV: 101.3 fL — ABNORMAL HIGH (ref 80.0–100.0)
MCV: 99.3 fL (ref 80.0–100.0)
Platelets: 250 10*3/uL (ref 150–400)
Platelets: 236 10*3/uL (ref 150–400)
RBC: 3.01 MIL/uL — ABNORMAL LOW (ref 3.87–5.11)
RBC: 2.91 MIL/uL — ABNORMAL LOW (ref 3.87–5.11)
RDW: 14.1 % (ref 11.5–15.5)
RDW: 14.1 % (ref 11.5–15.5)
WBC: 12 10*3/uL — ABNORMAL HIGH (ref 4.0–10.5)
WBC: 12 10*3/uL — ABNORMAL HIGH (ref 4.0–10.5)
nRBC: 0 % (ref 0.0–0.2)
nRBC: 0 % (ref 0.0–0.2)

## 2023-10-28 LAB — BASIC METABOLIC PANEL
Anion gap: 9 (ref 5–15)
BUN: 14 mg/dL (ref 8–23)
CO2: 19 mmol/L — ABNORMAL LOW (ref 22–32)
Calcium: 8.5 mg/dL — ABNORMAL LOW (ref 8.9–10.3)
Chloride: 94 mmol/L — ABNORMAL LOW (ref 98–111)
Creatinine, Ser: 0.76 mg/dL (ref 0.44–1.00)
GFR, Estimated: >60 mL/min (ref >60)
Glucose, Bld: 106 mg/dL — ABNORMAL HIGH (ref 70–99)
Potassium: 4.6 mmol/L (ref 3.5–5.1)
Sodium: 122 mmol/L — ABNORMAL LOW (ref 135–145)

## 2023-10-28 LAB — COMPREHENSIVE METABOLIC PANEL
ALT: 44 U/L (ref 0–44)
AST: 43 U/L — ABNORMAL HIGH (ref 15–41)
Albumin: 2.7 g/dL — ABNORMAL LOW (ref 3.5–5.0)
Alkaline Phosphatase: 72 U/L (ref 38–126)
Anion gap: 10 (ref 5–15)
BUN: 13 mg/dL (ref 8–23)
CO2: 22 mmol/L (ref 22–32)
Calcium: 8.4 mg/dL — ABNORMAL LOW (ref 8.9–10.3)
Chloride: 88 mmol/L — ABNORMAL LOW (ref 98–111)
Creatinine, Ser: 0.93 mg/dL (ref 0.44–1.00)
GFR, Estimated: >60 mL/min (ref >60)
Glucose, Bld: 95 mg/dL (ref 70–99)
Potassium: 4.7 mmol/L (ref 3.5–5.1)
Sodium: 120 mmol/L — ABNORMAL LOW (ref 135–145)
Total Bilirubin: 0.8 mg/dL (ref 0.0–1.2)
Total Protein: 5.1 g/dL — ABNORMAL LOW (ref 6.5–8.1)

## 2023-10-28 LAB — CK: Total CK: 215 U/L (ref 38–234)

## 2023-10-28 LAB — PHOSPHORUS: Phosphorus: 3.8 mg/dL (ref 2.5–4.6)

## 2023-10-28 LAB — MAGNESIUM: Magnesium: 1.6 mg/dL — ABNORMAL LOW (ref 1.7–2.4)

## 2023-10-28 MED ORDER — OXYCODONE HCL 5 MG PO TABS
5.0000 mg | ORAL_TABLET | ORAL | Status: DC | PRN
  Administered 2023-10-28: 10 mg via ORAL
  Administered 2023-10-29: 5 mg via ORAL
  Administered 2023-10-29 – 2023-10-30 (×3): 10 mg via ORAL
  Administered 2023-10-30 – 2023-10-31 (×4): 5 mg via ORAL
  Filled 2023-10-28 (×2): qty 2
  Filled 2023-10-28: qty 1
  Filled 2023-10-28 (×2): qty 2
  Filled 2023-10-28 (×2): qty 1
  Filled 2023-10-28 (×2): qty 2

## 2023-10-28 MED ORDER — ONDANSETRON HCL 4 MG/2ML IJ SOLN
4.0000 mg | Freq: Four times a day (QID) | INTRAMUSCULAR | Status: DC | PRN
  Administered 2023-10-28: 4 mg via INTRAVENOUS
  Filled 2023-10-28: qty 2

## 2023-10-28 MED ORDER — MAGNESIUM SULFATE 2 GM/50ML IV SOLN
2.0000 g | Freq: Once | INTRAVENOUS | Status: AC
  Administered 2023-10-28: 2 g via INTRAVENOUS
  Filled 2023-10-28: qty 50

## 2023-10-28 NOTE — Evaluation (Signed)
 Physical Therapy Evaluation Patient Details Name: Denise [name] MRN: [id number] DOB: Jan 05, 1961 Today's Date: 10/28/2023  History of Present Illness  Pt is a 63 y.o. female presenting 3/21 with R rib pain, lower back pain, and neck pain after fall at home. CXR revealed PTX; chest tube placed 3/22-3/24. Also found to have R sided rib 5-7 fx, L4 TP fx, mild sup endplate fx, back hematoma, and ABLA. CT cervical spine negative. 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   Clinical Impression  Pt in bed upon arrival of PT, agreeable to evaluation at this time. Prior to admission the pt was independent without use of DME, living with her daughter in a home with 3 steps to enter. The pt reports no other falls in last 6 months and no issues with ADLs. The pt presents with significant anxiety today, and her frequent jittery movements made it difficult to get accurate reading on SpO2. The pt reported no SOB with activity, but was on 8L O2. After ambulation, O2 decreased to 6L and pt seemed to tolerate well, but with questionable pleth, the reading dropped to 80s. Will continue to assess O2 needs with mobility as well as stair training prior to d/c. However, pt able to complete sit-stand transfer and short bout of hallway ambulation with CGA and use of RW. Will continue to benefit from skilled PT acutely and after d/c to address dynamic balance, endurance, and wean from DME use.     If plan is discharge home, recommend the following: A little help with walking and/or transfers;Help with stairs or ramp for entrance;Assistance with cooking/housework;Direct supervision/assist for medications management;Direct supervision/assist for financial management;Assist for transportation;Supervision due to cognitive status   Can travel by private  vehicle        Equipment Recommendations Rolling walker (2 wheels);BSC/3in1  Recommendations for Other Services       Functional Status Assessment Patient has had a recent decline in their functional status and demonstrates the ability to make significant improvements in function in a reasonable and predictable amount of time.     Precautions / Restrictions Precautions Precautions: Fall;Back (back for comfort) Precaution Booklet Issued: Yes (comment) Recall of Precautions/Restrictions: Intact Precaution/Restrictions Comments: reviewed for pt comfort Required Braces or Orthoses: Spinal Brace Spinal Brace: Lumbar corset Restrictions Weight Bearing Restrictions Per Provider Order: No      Mobility  Bed Mobility Overal bed mobility: Independent             General bed mobility comments: EOB on arrival and departure    Transfers Overall transfer level: Needs assistance Equipment used: Rolling walker (2 wheels) Transfers: Sit to/from Stand Sit to Stand: Contact guard assist           General transfer comment: cues for hand placement during transfers as pt unfamiliar with RW    Ambulation/Gait Ambulation/Gait assistance: Contact guard assist Gait Distance (Feet): 65 Feet Assistive device: Rolling walker (2 wheels) Gait Pattern/deviations: Step-through pattern, Decreased stride length, Trunk flexed Gait velocity: decreased Gait velocity interpretation: <1.31 ft/sec, indicative of household ambulator   General Gait Details: cues for position in RW and safety. pt wit no overt LOB and VSS on 8L     Balance Overall balance assessment: Needs assistance Sitting-balance support: No upper extremity supported, Feet supported Sitting balance-Leahy Scale: Good Sitting balance - Comments: able to achieve figure 4 at EOB  Standing balance support: During functional activity, Bilateral upper extremity supported, No upper extremity supported Standing balance-Leahy Scale:  Fair Standing balance comment: able to static stand without support; benefits from RW dynamically                             Pertinent Vitals/Pain Pain Assessment Pain Assessment: Faces Faces Pain Scale: Hurts little more Pain Location: back/ribs Pain Descriptors / Indicators: Sore Pain Intervention(s): Limited activity within patient's tolerance, Monitored during session, Repositioned    Home Living Family/patient expects to be discharged to:: Private residence Living Arrangements: Children (daughter works during the day as a Runner, broadcasting/film/video) Available Help at Discharge: Family Type of Home: House Home Access: Stairs to enter Entrance Stairs-Rails: None (landlord supposed to install rails) Secretary/administrator of Steps: 3 up to porch + threshold into door   Home Layout: One level Home Equipment: Hand held shower head      Prior Function Prior Level of Function : Independent/Modified Independent;Driving (this is pt's only fall in the past 6+ months)             Mobility Comments: no AD ADLs Comments: independent, grocery shops, light meal prep. 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     Extremity/Trunk Assessment   Upper Extremity Assessment Upper Extremity Assessment: Defer to OT evaluation    Lower Extremity Assessment Lower Extremity Assessment: Overall WFL for tasks assessed    Cervical / Trunk Assessment Cervical / Trunk Assessment: Other exceptions Cervical / Trunk Exceptions: spinal and rib fxs  Communication   Communication Communication: No apparent difficulties    Cognition Arousal: Alert Behavior During Therapy: WFL for tasks assessed/performed, Anxious (constant shaking throughout session. pt reports the dilaudid she had was too strong)   PT - Cognitive impairments: Awareness, Memory, Attention, Problem solving                       PT - Cognition Comments: pt  reports impaired by dilaudid. also very anxious through session and discussed taking her home anxiety meds Following commands: Intact       Cueing Cueing Techniques: Verbal cues, Gestural cues     General Comments General comments (skin integrity, edema, etc.): VSS on 8L, pt with poor SpO2 pleth due to jittery movements, but did not c/o SOB. BP stable        Assessment/Plan    PT Assessment Patient needs continued PT services  PT Problem List Decreased activity tolerance;Decreased balance;Decreased mobility       PT Treatment Interventions DME instruction;Gait training;Stair training;Functional mobility training;Therapeutic activities;Therapeutic exercise;Balance training    PT Goals (Current goals can be found in the Care Plan section)  Acute Rehab PT Goals Patient Stated Goal: return to independence, reduce pain PT Goal Formulation: With patient/family Time For Goal Achievement: 11/11/23 Potential to Achieve Goals: Good    Frequency Min 2X/week        AM-PAC PT "6 Clicks" Mobility  Outcome Measure Help needed turning from your back to your side while in a flat bed without using bedrails?: None Help needed moving from lying on your back to sitting on the side of a flat bed without using bedrails?: None Help needed moving to and from a bed to a chair (including a wheelchair)?: A Little Help needed standing up from a chair using your arms (e.g., wheelchair or bedside chair)?: A Little Help needed  to walk in hospital room?: A Little Help needed climbing 3-5 steps with a railing? : A Lot 6 Click Score: 19    End of Session Equipment Utilized During Treatment: Gait belt;Back brace;Oxygen Activity Tolerance: Patient tolerated treatment well Patient left: in bed;with call bell/phone within reach;with family/visitor present Nurse Communication: Mobility status PT Visit Diagnosis: Unsteadiness on feet (R26.81);Other abnormalities of gait and mobility (R26.89);Pain Pain -  part of body:  (back)    Time: 4098-1191 PT Time Calculation (min) (ACUTE ONLY): 41 min   Charges:   PT Evaluation $PT Eval Moderate Complexity: 1 Mod   PT General Charges $$ ACUTE PT VISIT: 1 Visit         Vickki Muff, PT, DPT   Acute Rehabilitation Department Office [phone] Secure Chat Communication Preferred  Ronnie Derby 10/28/2023, 3:44 PM

## 2023-10-28 NOTE — Progress Notes (Addendum)
 Traumatic pneumothorax  Subjective: Having some lower back pain, and rib pain. Also reports anxiety. Denies nausea/vomiting. Has not worked with PT but she is mobilizing well in the bed, sitting up on her own, swinging feet over side of bed ,etc.    Objective: Vital signs in last 24 hours: Temp:  [97.6 F (36.4 C)-98.6 F (37 C)] 97.7 F (36.5 C) (03/24 0739) Pulse Rate:  [82-92] 82 (03/24 0739) Resp:  [17-20] 18 (03/24 0739) BP: (107-138)/(59-84) 107/59 (03/24 0739) SpO2:  [89 %-92 %] 89 % (03/24 0739) Last BM Date : 10/26/23  Intake/Output from previous day: 03/23 0701 - 03/24 0700 In: 240 [P.O.:240] Out: 110 [Chest Tube:110] Intake/Output this shift: No intake/output data recorded.  General appearance: alert and cooperative Resp: non-labored breathing on 6L [place], good cough Back: significant echhymosis with fullness and fluctuance just above the sacrum.   Lab Results:  Results for orders placed or performed during the hospital encounter of 10/26/23 (from the past 24 hours)  Magnesium     Status: None   Collection Time: 10/27/23  1:42 PM  Result Value Ref Range   Magnesium 1.8 1.7 - 2.4 mg/dL  CK     Status: Abnormal   Collection Time: 10/27/23  1:42 PM  Result Value Ref Range   Total CK 422 (H) 38 - 234 U/L  Comprehensive metabolic panel     Status: Abnormal   Collection Time: 10/27/23  1:42 PM  Result Value Ref Range   Sodium 125 (L) 135 - 145 mmol/L   Potassium 4.0 3.5 - 5.1 mmol/L   Chloride 94 (L) 98 - 111 mmol/L   CO2 21 (L) 22 - 32 mmol/L   Glucose, Bld 112 (H) 70 - 99 mg/dL   BUN 9 8 - 23 mg/dL   Creatinine, Ser 0.86 0.44 - 1.00 mg/dL   Calcium 8.9 8.9 - 57.8 mg/dL   Total Protein 5.9 (L) 6.5 - 8.1 g/dL   Albumin 3.1 (L) 3.5 - 5.0 g/dL   AST 87 (H) 15 - 41 U/L   ALT 68 (H) 0 - 44 U/L   Alkaline Phosphatase 104 38 - 126 U/L   Total Bilirubin 0.9 0.0 - 1.2 mg/dL   GFR, Estimated >46 >96 mL/min   Anion gap 10 5 - 15  CBC     Status: Abnormal    Collection Time: 10/28/23  7:23 AM  Result Value Ref Range   WBC 12.0 (H) 4.0 - 10.5 K/uL   RBC 3.01 (L) 3.87 - 5.11 MIL/uL   Hemoglobin 10.1 (L) 12.0 - 15.0 g/dL   HCT 29.5 (L) 28.4 - 13.2 %   MCV 101.3 (H) 80.0 - 100.0 fL   MCH 33.6 26.0 - 34.0 pg   MCHC 33.1 30.0 - 36.0 g/dL   RDW 44.0 10.2 - 72.5 %   Platelets 250 150 - 400 K/uL   nRBC 0.0 0.0 - 0.2 %  Comprehensive metabolic panel     Status: Abnormal   Collection Time: 10/28/23  7:23 AM  Result Value Ref Range   Sodium 120 (L) 135 - 145 mmol/L   Potassium 4.7 3.5 - 5.1 mmol/L   Chloride 88 (L) 98 - 111 mmol/L   CO2 22 22 - 32 mmol/L   Glucose, Bld 95 70 - 99 mg/dL   BUN 13 8 - 23 mg/dL   Creatinine, Ser 3.66 0.44 - 1.00 mg/dL   Calcium 8.4 (L) 8.9 - 10.3 mg/dL   Total Protein 5.1 (L) 6.5 -  8.1 g/dL   Albumin 2.7 (L) 3.5 - 5.0 g/dL   AST 43 (H) 15 - 41 U/L   ALT 44 0 - 44 U/L   Alkaline Phosphatase 72 38 - 126 U/L   Total Bilirubin 0.8 0.0 - 1.2 mg/dL   GFR, Estimated >86 >57 mL/min   Anion gap 10 5 - 15  CK     Status: None   Collection Time: 10/28/23  7:23 AM  Result Value Ref Range   Total CK 215 38 - 234 U/L  Phosphorus     Status: None   Collection Time: 10/28/23  7:23 AM  Result Value Ref Range   Phosphorus 3.8 2.5 - 4.6 mg/dL  Magnesium     Status: Abnormal   Collection Time: 10/28/23  7:23 AM  Result Value Ref Range   Magnesium 1.6 (L) 1.7 - 2.4 mg/dL     Studies/Results Radiology     MEDS, Scheduled  acetaminophen  1,000 mg Oral Q6H   arformoterol  15 mcg Nebulization BID   atorvastatin  80 mg Oral Daily   budesonide (PULMICORT) nebulizer solution  0.5 mg Nebulization BID   cyanocobalamin  1,000 mcg Oral Daily   gabapentin  300 mg Oral TID   lidocaine  3 patch Transdermal Q24H   revefenacin  175 mcg Nebulization Daily   sodium chloride  1 g Oral TID WC   Vitamin D (Ergocalciferol)  50,000 Units Oral Q7 days   CXR with no ptx  Assessment: Traumatic pneumothorax R sided rib fractures  5-7 L4 TP fx, mild sup endplate fx    Plan: 63 y/o F s/p GLF Traumatic pneumothorax-  CXR w/o PTX. D/C chest tube today, check 4h film. R sided rib fractures 5-7-  multimodality pain management, wean O2 as able L4 TP fx, mild sup endplate fx- multimodality pain management  Back hematoma - hold DVT ppx/ASA, monitor. No role for further imaging at this time. Traumatic muscular hematomas usually resolve without acute intervention. ABL anemia - hgb 16 > 14 > 10, I do suspect hematoma is main source of blood loss. HR 90's, BP 107-100 systolic (from 130 yesterday). Monitor.  Pain: 1,000 mg tylenol q 6h, stop toradol, increase oxy to 5-10 mg PRN, lidoderm patch, dilaudid for breakthrough FEN: Reg diet, 1200 mL fluid restriction, on salt tabs for hyponatremia ID: none indicated VTE: BID lovneox 3/22-3/23 for ppx, hold DVT ppx given ABL anemia as above.  Dispo: progressive care, PT/OT, CBC/BMP pending for 1200 for hyponatremia and anemia.  Appreciate TRH following for assistance with MMP, hyponatremia Aortic stenosis, critical stenosis B iliac and femoral arteries: cont aspirin Symptomatic carotid artery stenosis: scheduled for CEA 3/28, I have notified Dr. Chestine Spore of her admission RLL airspace disease concerning for aspiration pneumonitis Chronic COPD: cont home meds, aggressive pulmonary toilet, baseline O2 90-91% per patient H/O CAD, HTN: cont home meds Lung nodule LLL: outpatient f/u Obesity      LOS: 2 days   Hosie Spangle, [facility] Surgery Please see Amion for pager number during day hours 7:00am-4:30pm        10/28/2023 10:34 AM

## 2023-10-28 NOTE — Progress Notes (Signed)
 PROGRESS NOTE  Denise [name] WUJ:[id number] DOB: [birth year]   PCP: Doreene Nest, NP  Patient is from: Home.  Lives with family.  DOA: 10/26/2023 LOS: 2  Chief complaints Chief Complaint  Patient presents with   Fall     Brief Narrative / Interim history: 63 year old F with PMH of CAD, carotid artery stenosis, COPD, HTN, osteoarthritis, anxiety, depression, migraine, lung nodule and prior tobacco use brought to ED after accidental fall off her porch to grass ground, and admitted by trauma team due to traumatic right pneumothorax with multiple right-sided rib fractures and thoracic spine fractures.  Hospitalist service consulted for medical management including hyponatremia and other underlying comorbidities.  Subjective: Seen and examined earlier this morning.  No major events overnight of this morning.  Pain improved.  She rates her pain 1/10.  Sodium dropped further to 120.  Hemoglobin dropped 4 g in the last 24 hours and 6 g since admission.  She reports some bruising over the backside.   Objective: Vitals:   10/27/23 2000 10/27/23 2105 10/28/23 0319 10/28/23 0739  BP: 107/74  110/68 (!) 107/59  Pulse: 84  92 82  Resp: 19  19 18   Temp: 98.6 F (37 C)  97.7 F (36.5 C) 97.7 F (36.5 C)  TempSrc: Oral  Oral Oral  SpO2: 90% 90% 90% (!) 89%  Weight:      Height:        Examination:  GENERAL: No apparent distress.  Nontoxic. HEENT: MMM.  Vision and hearing grossly intact.  NECK: Supple.  No apparent JVD.  RESP:  No IWOB.  Fair aeration bilaterally.  Chest tube to right chest. CVS:  RRR. Heart sounds normal.  ABD/GI/GU: BS+. Abd soft, NTND.  MSK/EXT:  Moves extremities. No apparent deformity. No edema.  Crepitus over right chest. SKIN: Ecchymosis over lower back.  Large hematoma over lower back.  NEURO: Awake, alert and oriented appropriately.  No apparent focal neuro deficit. PSYCH: Calm. Normal affect.   Consultants:  TRH  Procedures: 3/22-chest tube  placement  Microbiology summarized: None  Assessment and plan: Fall at home: Larey Seat off 3 feet high porch to grass ground.  Patient with poor right eye vision, depth perception and dizziness due to carotid artery stenosis. 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  Subcutaneous gas decreased. -Per trauma team/primary team  Acute blood loss anemia likely due to lower back hematoma: Hgb dropped 4 g in the last 24 hours.  She has ecchymosis and large hematoma over lower back. Recent Labs    12/27/22 1003 02/21/23 1301 10/24/23 0915 10/26/23 0541 10/26/23 0542 10/27/23 0437 10/28/23 0723  HGB 14.6 14.5 16.5* 16.2* 18.4* 14.2 10.1*  -Discontinue Lovenox, aspirin and Toradol -Recheck CBC at noon.  If hemoglobin drops further, needs CT angio -SCD for VTE prophylaxis    Aspiration pneumonitis: CT angio chest shows confluent airspace opacity in RLL and early involvement in RML.  Doubt pneumonia.  No respiratory distress. -Monitor off antibiotics -Pulmonary toilet -Aspiration precaution   Symptomatic carotid artery stenosis: Patient reports poor vision with poor depth perception and right eye and intermittent dizziness that she attributes to this.  She is scheduled for CEA by vascular surgery on 11/01/2023. -Primary team notified vascular surgery. -Continue aspirin and statin   Secondary polycythemia: Likely due to dehydration.  Resolved.  Now anemic.  Hyponatremia: Sodium trended from 130-123>>  120.  After IV NS raising concern for SIADH but urine sodium only 14.  Urine osmolality elevated to 498.  TSH normal.  She has lung nodule.  Also Lexapro and lisinopril at home. -Discontinued Lexapro and IV fluid.  Holding lisinopril. -Continue p.o. sodium chloride 1 g 3 times daily -Start fluid restriction -Recheck  BMP at noon  Chronic COPD: Stable. -Continue scheduled and as needed nebulizers.   History of CAD: No anginal symptoms.  Coronary CT on 3/9 with hemodynamically insignificant CAD. -Continue home aspirin and Lipitor.   Essential hypertension: Soft blood pressure today. -Discontinue amlodipine.  Has already received morning dose. -Continue holding lisinopril   Elevated LFT: Mild.  Improved.  CK normalized.   Anxiety and depression: Stable -Holding Celexa due to hyponatremia   Slowly enlarging lung nodule: known slowly enlarging LLL nodule.  Patient with 20-pack-year history before she quit in 05/2023. -Outpatient follow-up.   Aortic atherosclerosis: Good DP pulses bilaterally. -Aspirin and statin as above.   Osteopenia: Noted on CT. -Vitamin D supplementation -Needs DEXA scan outpatient  Severe vitamin D deficiency: Vitamin D level 8.28. -Started vitamin D 50,000 units weekly  Class I obesity Body mass index is 30.56 kg/m.           DVT prophylaxis:  Place and maintain sequential compression device Start: 10/28/23 0854  Code Status: Full code Family Communication: None at bedside Level of care: Progressive Status is: Inpatient   Disposition: Inpatient   55 minutes with more than 50% spent in reviewing records, counseling patient/family and coordinating care.   Sch Meds:  Scheduled Meds:  acetaminophen  1,000 mg Oral Q6H   amLODipine  10 mg Oral Daily   arformoterol  15 mcg Nebulization BID   atorvastatin  80 mg Oral Daily   budesonide (PULMICORT) nebulizer solution  0.5 mg Nebulization BID   cyanocobalamin  1,000 mcg Oral Daily   gabapentin  300 mg Oral TID   lidocaine  3 patch Transdermal Q24H   revefenacin  175 mcg Nebulization Daily   sodium chloride  1 g Oral TID WC   Vitamin D (Ergocalciferol)  50,000 Units Oral Q7 days   Continuous Infusions:  magnesium sulfate bolus IVPB     PRN Meds:.famotidine, HYDROmorphone (DILAUDID) injection,  ipratropium-albuterol, labetalol, ondansetron (ZOFRAN) IV, oxyCODONE  Antimicrobials: Anti-infectives (From admission, onward)    None        I have personally reviewed the following labs and images: CBC: Recent Labs  Lab 10/24/23 0915 10/26/23 0541 10/26/23 0542 10/27/23 0437 10/28/23 0723  WBC 11.4* 17.1*  --  12.1* 12.0*  NEUTROABS  --  15.0*  --   --   --   HGB 16.5* 16.2* 18.4* 14.2 10.1*  HCT 51.6* 49.7* 54.0* 42.6 30.5*  MCV 102.2* 100.8*  --  99.5 101.3*  PLT 337 311  --  236 250   BMP &GFR Recent Labs  Lab 10/24/23 0915 10/26/23 0541 10/26/23 0542 10/27/23 0437 10/27/23 1342 10/28/23 0723  NA 135 130* 129* 123* 125* 120*  K 4.5 4.0 4.0 4.4 4.0 4.7  CL 96* 97* 101 92* 94* 88*  CO2 26 18*  --  23 21* 22  GLUCOSE 111* 125* 120* 93 112* 95  BUN 5* 7* 7* 10 9 13   CREATININE 0.73 0.78 0.70 0.57 0.63 0.93  CALCIUM 9.2 9.1  --  8.7* 8.9 8.4*  MG  --  1.7  --   --  1.8 1.6*  PHOS  --  3.3  --   --   --  3.8   Estimated Creatinine Clearance: 62.1 mL/min (by C-G formula based on SCr of 0.93 mg/dL). Liver & Pancreas: Recent Labs  Lab 10/24/23 0915 10/26/23 0541 10/27/23 1342 10/28/23 0723  AST 34 66* 87* 43*  ALT 28 39 68* 44  ALKPHOS 83 79 104 72  BILITOT 0.8 0.5 0.9 0.8  PROT 7.1 6.7 5.9* 5.1*  ALBUMIN 3.8 3.7 3.1* 2.7*   No results for input(s): "LIPASE", "AMYLASE" in the last 168 hours. No results for input(s): "AMMONIA" in the last 168 hours. Diabetic: No results for input(s): "HGBA1C" in the last 72 hours. No results for input(s): "GLUCAP" in the last 168 hours. Cardiac Enzymes: Recent Labs  Lab 10/27/23 1342 10/28/23 0723  CKTOTAL 422* 215   No results for input(s): "PROBNP" in the last 8760 hours. Coagulation Profile: Recent Labs  Lab 10/24/23 0915 10/26/23 0541  INR 0.9 0.9   Thyroid Function Tests: Recent Labs    10/26/23 1000  TSH 1.043   Lipid Profile: No results for input(s): "CHOL", "HDL", "LDLCALC", "TRIG",  "CHOLHDL", "LDLDIRECT" in the last 72 hours. Anemia Panel: No results for input(s): "VITAMINB12", "FOLATE", "FERRITIN", "TIBC", "IRON", "RETICCTPCT" in the last 72 hours. Urine analysis:    Component Value Date/Time   COLORURINE AMBER (A) 10/24/2023 0942   APPEARANCEUR CLEAR 10/24/2023 0942   LABSPEC 1.017 10/24/2023 0942   PHURINE 5.0 10/24/2023 0942   GLUCOSEU NEGATIVE 10/24/2023 0942   HGBUR NEGATIVE 10/24/2023 0942   BILIRUBINUR NEGATIVE 10/24/2023 0942   BILIRUBINUR neg 12/27/2022 0955   KETONESUR NEGATIVE 10/24/2023 0942   PROTEINUR NEGATIVE 10/24/2023 0942   UROBILINOGEN 0.2 12/27/2022 0955   UROBILINOGEN 0.2 03/29/2012 2003   NITRITE NEGATIVE 10/24/2023 0942   LEUKOCYTESUR NEGATIVE 10/24/2023 0942   Sepsis Labs: Invalid input(s): "PROCALCITONIN", "LACTICIDVEN"  Microbiology: Recent Results (from the past 240 hours)  Surgical pcr screen     Status: None   Collection Time: 10/24/23  8:59 AM   Specimen: Nasal Mucosa; Nasal Swab  Result Value Ref Range Status   MRSA, PCR NEGATIVE NEGATIVE Final   Staphylococcus aureus NEGATIVE NEGATIVE Final    Comment: (NOTE) The Xpert SA Assay (FDA approved for NASAL specimens in patients 8 years of age and older), is one component of a comprehensive surveillance program. It is not intended to diagnose infection nor to guide or monitor treatment. Performed at [facility], [address], [place], [place] [address]     Radiology Studies: DG CHEST PORT 1 VIEW Result Date: 10/28/2023 CLINICAL DATA:  [id number]. Pneumothorax and right rib fractures. Right chest tube. EXAM: PORTABLE CHEST 1 VIEW COMPARISON:  Portable chest yesterday at 6:17 a.m. FINDINGS: 5:37 a.m. there is persistent ongoing extensive chest wall emphysema extending into the supraclavicular areas. This appears minimally improved in the supraclavicular areas but is otherwise unchanged. A pigtail tube thoracostomy in the lateral right mid chest is similar in  positioning. Only the pigtail remains intrathoracic. There's no measurable pneumothorax bilaterally. There are emphysematous and chronic changes of the lungs without a visible acute infiltrate with asymmetric elevation of the right diaphragm. The mediastinum is normally outlined. There is aortic atherosclerosis. No new osseous findings. IMPRESSION: 1. Persistent extensive chest wall emphysema extending into the supraclavicular areas. This appears minimally improved in the supraclavicular areas but is otherwise unchanged. 2. No measurable pneumothorax bilaterally. Only the pigtail portion of the right chest tube remains intrathoracic. 3. Emphysema.  Chronic changes. 4. Aortic atherosclerosis. Electronically Signed   By: Earlean Shawl.D.  On: 10/28/2023 07:52      Latesa Fratto T. Demarques Pilz [facility]  If 7PM-7AM, please contact night-coverage [email or web address] 10/28/2023, 9:28 AM

## 2023-10-28 NOTE — Telephone Encounter (Signed)
 Caller Elnita Maxwell) calling to give over-read results.

## 2023-10-28 NOTE — TOC Initial Note (Signed)
 Transition of Care [facility]) - Initial/Assessment Note    Patient Details  Name: Denise [name] MRN: [id number] Date of Birth: 1961-02-25  [facility] CM/SW Contact:    Glennon Mac, RN Phone Number: 10/28/2023, 11:50am  Clinical Narrative:                 Pt is a 63 y.o. female presenting 3/21 with R rib pain, lower back pain, and neck pain after fall at home. CXR revealed PTX; chest tube placed 3/22-3/24. Also found to have R sided rib 5-7 fx, L4 TP fx, mild sup endplate fx, back hematoma, and ABLA. CT cervical spine negative.  PTA, pt independent and living at home with daughter, at bedside.  Daughter states she works during the day.  Patient currently remains on nasal oxygen, awaiting PT/OT evaluations.  Chest tube removed this AM.  Will follow for therapy recommendations.   PCP is Sammuel Cooper, NP.  Expected Discharge Plan: Home w Home Health Services Barriers to Discharge: Continued Medical Work up            Expected Discharge Plan and Services   Discharge Planning Services: CM Consult                                          Prior Living Arrangements/Services   Lives with:: Adult Children Patient language and need for interpreter reviewed:: Yes Do you feel safe going back to the place where you live?: Yes      Need for Family Participation in Patient Care: Yes (Comment) Care giver support system in place?: Yes (comment)   Criminal Activity/Legal Involvement Pertinent to Current Situation/Hospitalization: No - Comment as needed  Activities of Daily Living   ADL Screening (condition at time of admission) Independently performs ADLs?: Yes (appropriate for developmental age) Is the patient deaf or have difficulty hearing?: No Does the patient have difficulty seeing, even when wearing glasses/contacts?: Yes (no vision in right eye) Does the patient have difficulty concentrating, remembering, or making decisions?: No  Permission  Sought/Granted   Permission granted to share information with : Yes, Verbal Permission Granted  Share Information with NAME: Maiana H[place]     Permission granted to share info w Relationship: daughter  Permission granted to share info w Contact Information: [phone]  Emotional Assessment Appearance:: Appears stated age Attitude/Demeanor/Rapport: Guarded Affect (typically observed): Anxious Orientation: : Oriented to Self, Oriented to Place, Oriented to  Time, Oriented to Situation      Admission diagnosis:  Traumatic pneumothorax [S27.0XXA] Traumatic pneumothorax, initial encounter [S27.0XXA] Fall, initial encounter [id number]] Closed fracture of multiple ribs of right side, initial encounter [S22.41XA] Fall (on) (from) other stairs and steps, initial encounter [W10.8XXA] Patient Active Problem List   Diagnosis Date Noted   Traumatic pneumothorax 10/26/2023   Fall at home, initial encounter 10/26/2023   High anion gap metabolic acidosis 10/26/2023   Elevated AST (SGOT) 10/26/2023   Closed fracture dislocation of thoracic spine (HCC) 10/26/2023   Multiple fractures of ribs, right side, initial encounter for closed fracture 10/26/2023   Visual disturbance 09/18/2023   Carotid artery disease (HCC) 06/04/2023   Frequent headaches 05/23/2023   Sinus pressure 02/19/2023   Primary osteoarthritis of first carpometacarpal joint of right hand 01/04/2023   Primary osteoarthritis of first carpometacarpal joint of left hand 01/04/2023   Right carpal tunnel syndrome 01/04/2023   Left carpal tunnel syndrome  01/04/2023   CAD (coronary artery disease) 12/27/2022   Encounter for annual general medical examination with abnormal findings in adult 12/27/2022   Chronic pain of both wrists 12/27/2022   Foul smelling urine 12/27/2022   Carotid atherosclerosis, bilateral 12/27/2022   Hyperlipidemia 12/27/2021   Iron deficiency anemia due to chronic blood loss 12/27/2021   Adnexal mass 09/08/2021    Anxiety and depression 06/16/2021   Lung nodule 12/30/2019   Sessile colonic polyp 12/30/2019   AVM (arteriovenous malformation) of colon 12/30/2019   Chest pressure 12/22/2019   Murmur 12/22/2019   Lower extremity edema 12/22/2019   Exertional dyspnea 12/22/2019   Symptomatic anemia 12/22/2019   Cardiomegaly 12/22/2019   Tobacco abuse 12/22/2019   Hyponatremia 12/22/2019   COPD (chronic obstructive pulmonary disease) (HCC) 12/22/2019   Former smoker 08/31/2019   Hypertension 03/31/2012   PCP:  Doreene Nest, NP Pharmacy:   [facility] [phone] - [address], [place] - [address] [place] [address] Phone: [phone] Fax: [phone]     Social Drivers of Health (SDOH) Social History: SDOH Screenings   Food Insecurity: No Food Insecurity (10/27/2023)  Housing: Patient Declined (10/27/2023)  Transportation Needs: No Transportation Needs (04/15/2023)  Alcohol Screen: Low Risk  (04/15/2023)  Depression (PHQ2-9): Medium Risk (09/18/2023)  Financial Resource Strain: Low Risk  (04/15/2023)  Physical Activity: Insufficiently Active (04/15/2023)  Social Connections: Unknown (04/15/2023)  Stress: Stress Concern Present (04/15/2023)  Tobacco Use: Medium Risk (10/28/2023)   SDOH Interventions:     Readmission Risk Interventions     No data to display         Quintella Baton, RN, BSN  Trauma/Neuro ICU Case Manager [phone]

## 2023-10-28 NOTE — Plan of Care (Signed)

## 2023-10-28 NOTE — Telephone Encounter (Signed)
 Patient's daughter, Leotis Shames, called to inform that patient fell over the weekend and broke several ribs.  She is currently inpatient at [facility].  Daughter cancelled surgery scheduled with Dr. Chestine Spore on Friday, March 28.  Dr Chestine Spore aware.

## 2023-10-28 NOTE — Telephone Encounter (Signed)
   Primary Cardiologist: Kristeen Miss, MD  Chart reviewed as part of pre-operative protocol coverage. Patient was seen by Dr. Elease Hashimoto on 09/06/2023 and underwent echocardiogram per his recommendation. Based on ACC/AHA guidelines, Denise [name] would be at acceptable risk for the planned procedure without further cardiovascular testing.   Patient was advised that if she develops new symptoms prior to surgery to contact our office to arrange a follow-up appointment.  He verbalized understanding.  There is no request to hold medications.  I will route this recommendation to the requesting party via Epic fax function and remove from pre-op pool.  Please call with questions.  Levi Aland, NP-C  10/28/2023, 6:59 AM [address] Office [phone] Fax [phone]

## 2023-10-28 NOTE — Progress Notes (Signed)
 Chest tube removed per order. Charge nurse Nate at bedside along with patient's daughter. Pigtail tip removed without incident and complete upon inspection. Vaseline gauze applied and covered with 4X4 regular gauze. Secured with medical tape. Scant drainage noted. Patient tolerated well. Will continue to monitor.

## 2023-10-28 NOTE — Telephone Encounter (Signed)
 Spoke with Denise [name] about Coronary CTA results  IMPRESSION: Slowly enlarging left lower lobe lung nodule, which was evaluated with PET-CT in 2022, but considering increased risk factors such as underlying Emphysema (ICD10-J43.9), Recommend referral to [facility]) if not already done, and additional evaluation to exclude a slow growing carcinoma.   Patient currently in the hospital after a recent fall.   Will forward to Dr. Elease Hashimoto to make him aware. Results have been posted.

## 2023-10-28 NOTE — Evaluation (Signed)
 Occupational Therapy Evaluation Patient Details Name: Denise [name] MRN: [id number] DOB: 07-28-1961 Today's Date: 10/28/2023   History of Present Illness   Pt is a 63 y.o. female presenting 3/21 with R rib pain, lower back pain, and neck pain after fall at home. CXR revealed PTX; chest tube placed 3/22-3/24. Also found to have R sided rib 5-7 fx, L4 TP fx, mild sup endplate fx, back hematoma, and ABLA. CT cervical spine negative. 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     Clinical Impressions PTA, pt lived with her daughter and was mod I for ADL and light IADL. More recently pt reports being less active secondary to carotid artery stenosis resulting in R eye peripheral visual impairment. Upon eval, pt grossly CGA for BADL and functional mobility with use of RW this session. Initiated education regarding spinal precautions for comfort. Pt with no brace orders in chart, but LSO in room, so donned for session. Will continue to follow acutely and recommending HHOT for initial home safety eval.      If plan is discharge home, recommend the following:   A little help with walking and/or transfers;A little help with bathing/dressing/bathroom;Assistance with cooking/housework;Assist for transportation;Help with stairs or ramp for entrance     Functional Status Assessment   Patient has had a recent decline in their functional status and demonstrates the ability to make significant improvements in function in a reasonable and predictable amount of time.     Equipment Recommendations   BSC/3in1     Recommendations for Other Services         Precautions/Restrictions   Precautions Precautions: Fall;Back (back for comfort) Precaution Booklet Issued: Yes (comment) Recall of Precautions/Restrictions:  Intact Precaution/Restrictions Comments: reviewed for pt comfort Required Braces or Orthoses: Spinal Brace Spinal Brace: Lumbar corset Restrictions Weight Bearing Restrictions Per Provider Order: No     Mobility Bed Mobility               General bed mobility comments: EOB on arrival and departure    Transfers Overall transfer level: Needs assistance Equipment used: Rolling walker (2 wheels) Transfers: Sit to/from Stand Sit to Stand: Contact guard assist           General transfer comment: cues for hand placement during transfers as pt unfamiliar with RW      Balance Overall balance assessment: Needs assistance Sitting-balance support: No upper extremity supported, Feet supported Sitting balance-Leahy Scale: Good Sitting balance - Comments: able to achieve figure 4 at EOB   Standing balance support: During functional activity, Bilateral upper extremity supported, No upper extremity supported Standing balance-Leahy Scale: Fair Standing balance comment: able to static stand without support; benefits from RW dynamically                           ADL either performed or assessed with clinical judgement   ADL Overall ADL's : Needs assistance/impaired Eating/Feeding: Independent   Grooming: Supervision/safety;Standing   Upper Body Bathing: Set up;Sitting   Lower Body Bathing: Contact guard assist;Sit to/from stand   Upper Body Dressing : Sitting;Moderate assistance Upper Body Dressing Details (indicate cue type and reason): during new learning for brace application Lower Body Dressing: Contact guard assist;Sit to/from stand Lower Body Dressing Details (indicate cue type and reason): with use of figure 4 after initial education Toilet Transfer:  Contact guard assist;Rolling walker (2 wheels);Ambulation   Toileting- Clothing Manipulation and Hygiene: Contact guard assist;Sit to/from stand       Functional mobility during ADLs: Contact guard  assist;Rolling walker (2 wheels)       Vision Baseline Vision/History: 1 Wears glasses Ability to See in Adequate Light: 1 Impaired Patient Visual Report: Other (comment) (no new changes but reports of R eye blindness.) Additional Comments: poor peripheral vision R eye as well as blurriness     Perception Perception: Not tested       Praxis Praxis: Not tested       Pertinent Vitals/Pain Pain Assessment Pain Assessment: Faces Faces Pain Scale: Hurts a little bit Pain Location: back/ribs Pain Descriptors / Indicators: Sore Pain Intervention(s): Monitored during session     Extremity/Trunk Assessment Upper Extremity Assessment Upper Extremity Assessment: Overall WFL for tasks assessed (not formally assessed; using BUE functionally)   Lower Extremity Assessment Lower Extremity Assessment: Defer to PT evaluation   Cervical / Trunk Assessment Cervical / Trunk Assessment: Other exceptions (spinal and rib fxs)   Communication Communication Communication: No apparent difficulties   Cognition Arousal: Alert Behavior During Therapy: WFL for tasks assessed/performed, Anxious (constant shaking throughout session. pt reports the dilaudid she had was too strong) Cognition: Cognition impaired     Awareness: Intellectual awareness intact, Online awareness intact Memory impairment (select all impairments): Short-term memory (suspect due to medications) Attention impairment (select first level of impairment): Selective attention                     Following commands: Intact       Cueing  General Comments   Cueing Techniques: Verbal cues;Gestural cues  VSS; intermittent SpO2 as low as the 70s but poor pleth with pt no signs or symptoms of difficulty breathing   Exercises     Shoulder Instructions      Home Living Family/patient expects to be discharged to:: Private residence Living Arrangements: Children (daughter works during the day as a Runner, broadcasting/film/video) Available  Help at Discharge: Family Type of Home: House Home Access: Stairs to enter Secretary/administrator of Steps: 3 up to porch + threshold into door Entrance Stairs-Rails: None (landlord supposed to install rails) Home Layout: One level     Bathroom Shower/Tub: Chief Strategy Officer: Standard     Home Equipment: Hand held shower head          Prior Functioning/Environment Prior Level of Function : Independent/Modified Independent;Driving (this is pt's only fall in the past 6+ months)             Mobility Comments: no AD ADLs Comments: independent, grocery shops, light meal prep. 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    OT Problem List: Decreased strength;Decreased activity tolerance;Impaired balance (sitting and/or standing);Decreased knowledge of use of DME or AE;Decreased knowledge of precautions;Pain   OT Treatment/Interventions: Self-care/ADL training;Therapeutic exercise;DME and/or AE instruction;Balance training;Patient/family education;Therapeutic activities;Energy conservation;Visual/perceptual remediation/compensation      OT Goals(Current goals can be found in the care plan section)   Acute Rehab OT Goals Patient Stated Goal: get better OT Goal Formulation: With patient Time For Goal Achievement: 11/11/23 Potential to Achieve Goals: Good   OT Frequency:  Min 1X/week    Co-evaluation              AM-PAC OT "6 Clicks" Daily Activity     Outcome Measure Help from another person eating meals?: None  Help from another person taking care of personal grooming?: A Little Help from another person toileting, which includes using toliet, bedpan, or urinal?: A Little Help from another person bathing (including washing, rinsing, drying)?: A Little Help from another person to put on and taking off regular upper body clothing?: A Little Help from another person to put on and taking off  regular lower body clothing?: A Little 6 Click Score: 19   End of Session Equipment Utilized During Treatment: Gait belt;Rolling walker (2 wheels);Back brace Nurse Communication: Mobility status (likley able to wean supplementary O2 if can get good PLETH for SpO2 reading)  Activity Tolerance: Patient tolerated treatment well Patient left: in bed;with call bell/phone within reach;with family/visitor present  OT Visit Diagnosis: Unsteadiness on feet (R26.81);Muscle weakness (generalized) (M62.81);Pain Pain - part of body:  (back, ribs)                Time: 7253-6644 OT Time Calculation (min): 17 min Charges:  OT General Charges $OT Visit: 1 Visit OT Evaluation $OT Eval Low Complexity: 1 Low  Tyler Deis, OTR/L [facility] Acute Rehabilitation Office: [phone]   Denise [name] 10/28/2023, 3:37 PM

## 2023-10-29 DIAGNOSIS — I2510 Atherosclerotic heart disease of native coronary artery without angina pectoris: Secondary | ICD-10-CM | POA: Diagnosis not present

## 2023-10-29 DIAGNOSIS — E8729 Other acidosis: Secondary | ICD-10-CM | POA: Diagnosis not present

## 2023-10-29 DIAGNOSIS — S270XXA Traumatic pneumothorax, initial encounter: Secondary | ICD-10-CM | POA: Diagnosis not present

## 2023-10-29 DIAGNOSIS — W19XXXA Unspecified fall, initial encounter: Secondary | ICD-10-CM | POA: Diagnosis not present

## 2023-10-29 LAB — BASIC METABOLIC PANEL
Anion gap: 9 (ref 5–15)
BUN: 10 mg/dL (ref 8–23)
CO2: 23 mmol/L (ref 22–32)
Calcium: 8.7 mg/dL — ABNORMAL LOW (ref 8.9–10.3)
Chloride: 96 mmol/L — ABNORMAL LOW (ref 98–111)
Creatinine, Ser: 0.49 mg/dL (ref 0.44–1.00)
GFR, Estimated: >60 mL/min (ref >60)
Glucose, Bld: 97 mg/dL (ref 70–99)
Potassium: 4.7 mmol/L (ref 3.5–5.1)
Sodium: 128 mmol/L — ABNORMAL LOW (ref 135–145)

## 2023-10-29 LAB — CBC
HCT: 27.2 % — ABNORMAL LOW (ref 36.0–46.0)
Hemoglobin: 9.3 g/dL — ABNORMAL LOW (ref 12.0–15.0)
MCH: 33.6 pg (ref 26.0–34.0)
MCHC: 34.2 g/dL (ref 30.0–36.0)
MCV: 98.2 fL (ref 80.0–100.0)
Platelets: 256 10*3/uL (ref 150–400)
RBC: 2.77 MIL/uL — ABNORMAL LOW (ref 3.87–5.11)
RDW: 14.2 % (ref 11.5–15.5)
WBC: 12.1 10*3/uL — ABNORMAL HIGH (ref 4.0–10.5)
nRBC: 0 % (ref 0.0–0.2)

## 2023-10-29 LAB — MAGNESIUM: Magnesium: 2.1 mg/dL (ref 1.7–2.4)

## 2023-10-29 NOTE — Plan of Care (Signed)

## 2023-10-29 NOTE — Progress Notes (Signed)
 PROGRESS NOTE  Denise [name] ZOX:[id number] DOB: [birth year]   PCP: Doreene Nest, NP  Patient is from: Home.  Lives with family.  DOA: 10/26/2023 LOS: 3  Chief complaints Chief Complaint  Patient presents with   Fall     Brief Narrative / Interim history: 63 year old F with PMH of CAD, carotid artery stenosis, COPD, HTN, osteoarthritis, anxiety, depression, migraine, lung nodule and prior tobacco use brought to ED after accidental fall off her porch to grass ground, and admitted by trauma team due to traumatic right pneumothorax with multiple right-sided rib fractures and thoracic spine fractures.  Hospitalist service consulted for medical management including hyponatremia and other underlying comorbidities.  Subjective: Seen and examined earlier this morning.  No major events overnight of this morning.  Feels better.  Hyponatremia improved.  Hgb leveled off at 9.3.  Objective: Vitals:   10/29/23 0757 10/29/23 0800 10/29/23 0808 10/29/23 1141  BP:    136/65  Pulse:    (!) 172  Resp: 17   16  Temp:    98.5 F (36.9 C)  TempSrc:    Oral  SpO2: 92% 100% 100% 98%  Weight:      Height:        Examination:  GENERAL: No apparent distress.  Nontoxic.  Sitting on bedside chair. HEENT: MMM.  Vision and hearing grossly intact.  NECK: Supple.  No apparent JVD.  RESP:  No IWOB.  Fair aeration bilaterally.  Chest tube to right chest. CVS:  RRR. Heart sounds normal.  ABD/GI/GU: BS+. Abd soft, NTND.  MSK/EXT:  Moves extremities. No apparent deformity. No edema.  Crepitus over right chest. SKIN: Ecchymosis over lower back.  Large hematoma over lower back.  NEURO: Awake, alert and oriented appropriately.  No apparent focal neuro deficit. PSYCH: Calm. Normal affect.   Consultants:  TRH  Procedures: 3/22-chest tube placement 3/24-chest tube removed  Microbiology summarized: None  Assessment and plan: Fall at home: Larey Seat off 3 feet high porch to grass ground.  Patient  with poor right eye vision, depth perception and dizziness due to carotid artery stenosis. -Fall precaution -PT/OT eval   Traumatic rib fractures Traumatic thoracic spine fractures Traumatic right pneumothorax with mediastinal and cutaneous emphysema -Treated with chest tube from 3/22-3/24.  Pneumothorax resolved. -Per trauma team/primary team  Acute blood loss anemia likely due to lower back hematoma: Baseline Hgb about 14.5.  Lost about 5 g likely due to large lower back hematoma and ecchymosis.  Hgb stable after stopping Lovenox, aspirin and Toradol. Recent Labs    12/27/22 1003 02/21/23 1301 10/24/23 0915 10/26/23 0541 10/26/23 0542 10/27/23 0437 10/28/23 0723 10/28/23 1202 10/29/23 0456  HGB 14.6 14.5 16.5* 16.2* 18.4* 14.2 10.1* 9.5* 9.3*  -Continue holding Lovenox, aspirin and Toradol -SCD for VTE prophylaxis-requested RN to place this  Aspiration pneumonitis: CT angio chest shows confluent airspace opacity in RLL and early involvement in RML.  Doubt pneumonia.  No respiratory distress. -Monitor off antibiotics -Pulmonary toilet -Aspiration precaution   Symptomatic carotid artery stenosis: Patient reports poor vision with poor depth perception and right eye and intermittent dizziness that she attributes to this.  She is scheduled for CEA by vascular surgery on 11/01/2023. -Vascular surgery notified by primary team. -Continue statin.  Holding aspirin due to hematoma   Hyponatremia: Sodium trended from 130-123>> 120>> 28.  Worse after IV NS raising concern for SIADH but urine sodium only 14.  Urine osmolality elevated to 498.  TSH normal.  She has lung nodule.  Also Lexapro and lisinopril at home.  Improving with p.o. NaCl and fluid restriction. -Continue holding Lexapro and lisinopril -Continue p.o. NaCl 1 g 3 times daily -Fluid restriction to less than 1200 cc a day -Recheck BMP in the morning  History of CAD: No anginal symptoms.  Coronary CT on 3/9 with  hemodynamically insignificant CAD. -Continue home aspirin and Lipitor.   Essential hypertension: Soft blood pressure today. -Discontinue amlodipine.  Has already received morning dose. -Continue holding lisinopril  Slowly enlarging lung nodule: known slowly enlarging LLL nodule.  Patient with 20-pack-year history before she quit in 05/2023. -Outpatient follow-up.  Chronic COPD: Stable. -Continue scheduled and as needed nebulizers.  Secondary polycythemia: Likely due to dehydration.  Resolved.  Now anemic.   Elevated LFT: Mild.  Improved.  CK normalized.   Anxiety and depression: Stable -Holding Celexa due to hyponatremia   Aortic atherosclerosis: Good DP pulses bilaterally. -Continue statin.   Osteopenia: Noted on CT. -Vitamin D supplementation -Needs DEXA scan outpatient  Severe vitamin D deficiency: Vitamin D level 8.28. -Started vitamin D 50,000 units weekly  Paroxysmal atrial contraction: Not tachycardic. -Monitor  Class I obesity Body mass index is 30.56 kg/m.           DVT prophylaxis:  Place and maintain sequential compression device Start: 10/28/23 0854  Code Status: Full code Family Communication: None at bedside Level of care: Progressive Status is: Inpatient   Disposition: Inpatient   55 minutes with more than 50% spent in reviewing records, counseling patient/family and coordinating care.   Sch Meds:  Scheduled Meds:  acetaminophen  1,000 mg Oral Q6H   arformoterol  15 mcg Nebulization BID   atorvastatin  80 mg Oral Daily   budesonide (PULMICORT) nebulizer solution  0.5 mg Nebulization BID   cyanocobalamin  1,000 mcg Oral Daily   gabapentin  300 mg Oral TID   lidocaine  3 patch Transdermal Q24H   revefenacin  175 mcg Nebulization Daily   sodium chloride  1 g Oral TID WC   Vitamin D (Ergocalciferol)  50,000 Units Oral Q7 days   Continuous Infusions:   PRN Meds:.famotidine, HYDROmorphone (DILAUDID) injection, ipratropium-albuterol,  labetalol, ondansetron (ZOFRAN) IV, oxyCODONE  Antimicrobials: Anti-infectives (From admission, onward)    None        I have personally reviewed the following labs and images: CBC: Recent Labs  Lab 10/26/23 0541 10/26/23 0542 10/27/23 0437 10/28/23 0723 10/28/23 1202 10/29/23 0456  WBC 17.1*  --  12.1* 12.0* 12.0* 12.1*  NEUTROABS 15.0*  --   --   --   --   --   HGB 16.2* 18.4* 14.2 10.1* 9.5* 9.3*  HCT 49.7* 54.0* 42.6 30.5* 28.9* 27.2*  MCV 100.8*  --  99.5 101.3* 99.3 98.2  PLT 311  --  236 250 236 256   BMP &GFR Recent Labs  Lab 10/26/23 0541 10/26/23 0542 10/27/23 0437 10/27/23 1342 10/28/23 0723 10/28/23 1202 10/29/23 0456  NA 130*   < > 123* 125* 120* 122* 128*  K 4.0   < > 4.4 4.0 4.7 4.6 4.7  CL 97*   < > 92* 94* 88* 94* 96*  CO2 18*  --  23 21* 22 19* 23  GLUCOSE 125*   < > 93 112* 95 106* 97  BUN 7*   < > 10 9 13 14 10   CREATININE 0.78   < > 0.57 0.63 0.93 0.76 0.49  CALCIUM 9.1  --  8.7* 8.9 8.4* 8.5* 8.7*  MG 1.7  --   --  1.8 1.6*  --  2.1  PHOS 3.3  --   --   --  3.8  --   --    < > = values in this interval not displayed.   Estimated Creatinine Clearance: 72.2 mL/min (by C-G formula based on SCr of 0.49 mg/dL). Liver & Pancreas: Recent Labs  Lab 10/24/23 0915 10/26/23 0541 10/27/23 1342 10/28/23 0723  AST 34 66* 87* 43*  ALT 28 39 68* 44  ALKPHOS 83 79 104 72  BILITOT 0.8 0.5 0.9 0.8  PROT 7.1 6.7 5.9* 5.1*  ALBUMIN 3.8 3.7 3.1* 2.7*   No results for input(s): "LIPASE", "AMYLASE" in the last 168 hours. No results for input(s): "AMMONIA" in the last 168 hours. Diabetic: No results for input(s): "HGBA1C" in the last 72 hours. No results for input(s): "GLUCAP" in the last 168 hours. Cardiac Enzymes: Recent Labs  Lab 10/27/23 1342 10/28/23 0723  CKTOTAL 422* 215   No results for input(s): "PROBNP" in the last 8760 hours. Coagulation Profile: Recent Labs  Lab 10/24/23 0915 10/26/23 0541  INR 0.9 0.9   Thyroid Function  Tests: No results for input(s): "TSH", "T4TOTAL", "FREET4", "T3FREE", "THYROIDAB" in the last 72 hours.  Lipid Profile: No results for input(s): "CHOL", "HDL", "LDLCALC", "TRIG", "CHOLHDL", "LDLDIRECT" in the last 72 hours. Anemia Panel: No results for input(s): "VITAMINB12", "FOLATE", "FERRITIN", "TIBC", "IRON", "RETICCTPCT" in the last 72 hours. Urine analysis:    Component Value Date/Time   COLORURINE AMBER (A) 10/24/2023 0942   APPEARANCEUR CLEAR 10/24/2023 0942   LABSPEC 1.017 10/24/2023 0942   PHURINE 5.0 10/24/2023 0942   GLUCOSEU NEGATIVE 10/24/2023 0942   HGBUR NEGATIVE 10/24/2023 0942   BILIRUBINUR NEGATIVE 10/24/2023 0942   BILIRUBINUR neg 12/27/2022 0955   KETONESUR NEGATIVE 10/24/2023 0942   PROTEINUR NEGATIVE 10/24/2023 0942   UROBILINOGEN 0.2 12/27/2022 0955   UROBILINOGEN 0.2 03/29/2012 2003   NITRITE NEGATIVE 10/24/2023 0942   LEUKOCYTESUR NEGATIVE 10/24/2023 0942   Sepsis Labs: Invalid input(s): "PROCALCITONIN", "LACTICIDVEN"  Microbiology: Recent Results (from the past 240 hours)  Surgical pcr screen     Status: None   Collection Time: 10/24/23  8:59 AM   Specimen: Nasal Mucosa; Nasal Swab  Result Value Ref Range Status   MRSA, PCR NEGATIVE NEGATIVE Final   Staphylococcus aureus NEGATIVE NEGATIVE Final    Comment: (NOTE) The Xpert SA Assay (FDA approved for NASAL specimens in patients 35 years of age and older), is one component of a comprehensive surveillance program. It is not intended to diagnose infection nor to guide or monitor treatment. Performed at [facility], [address], [place], [place] [address]     Radiology Studies: DG CHEST PORT 1 VIEW Result Date: 10/28/2023 CLINICAL DATA:  Chest tube removal EXAM: PORTABLE CHEST 1 VIEW COMPARISON:  10/28/2023 FINDINGS: Interval removal of right chest tube. No significant residual pneumothorax. There is extensive subcutaneous emphysema throughout the chest and neck bilaterally. Subcutaneous  emphysema appears increased since previous study. Cardiac enlargement. No definite edema or consolidation in the lungs. No pleural effusions. Acute appearing displaced right rib fractures, similar to prior study. Calcification of the aorta. IMPRESSION: Apparent increase of subcutaneous emphysema after right chest tube removal. No definite significant residual pneumothorax. Electronically Signed   By: Burman Nieves M.D.   On: 10/28/2023 21:42      Soriyah Osberg T. Jerrie Gullo [facility]  If 7PM-7AM, please contact night-coverage [email or web address] 10/29/2023, 1:52 PM

## 2023-10-29 NOTE — Progress Notes (Addendum)
 Traumatic pneumothorax  Subjective: Just mobilized in hallway, requiring 10 L O2 via [place] with mobility.  States she has been given a referral to a pulmonologist but has not been yet. Reports history of heart murmur, denies a known history of cardiac arrhythmia.  Tolerating PO. +flatus Denies heart palpitations or chest pressure, stable reproducible chest pain  Objective: Vital signs in last 24 hours: Temp:  [98 F (36.7 C)-99 F (37.2 C)] 98 F (36.7 C) (03/25 0737) Pulse Rate:  [74-102] 89 (03/25 0737) Resp:  [16-23] 17 (03/25 0757) BP: (108-141)/(58-83) 116/62 (03/25 0737) SpO2:  [87 %-100 %] 100 % (03/25 0808) Last BM Date : 10/26/23  Intake/Output from previous day: 03/24 0701 - 03/25 0700 In: 960 [P.O.:960] Out: 1040 [Urine:1000; Chest Tube:40] Intake/Output this shift: No intake/output data recorded.  General appearance: alert and cooperative CV: HR 90-100's. Monitor says afib, pulse feels regular to me.  Resp: non-labored breathing on 7L HFNC [place], good cough Back: significant echhymosis with fullness and fluctuance just above the sacrum - stable  Lab Results:  Results for orders placed or performed during the hospital encounter of 10/26/23 (from the past 24 hours)  Basic metabolic panel     Status: Abnormal   Collection Time: 10/28/23 12:02 PM  Result Value Ref Range   Sodium 122 (L) 135 - 145 mmol/L   Potassium 4.6 3.5 - 5.1 mmol/L   Chloride 94 (L) 98 - 111 mmol/L   CO2 19 (L) 22 - 32 mmol/L   Glucose, Bld 106 (H) 70 - 99 mg/dL   BUN 14 8 - 23 mg/dL   Creatinine, Ser 1.61 0.44 - 1.00 mg/dL   Calcium 8.5 (L) 8.9 - 10.3 mg/dL   GFR, Estimated >09 >60 mL/min   Anion gap 9 5 - 15  CBC     Status: Abnormal   Collection Time: 10/28/23 12:02 PM  Result Value Ref Range   WBC 12.0 (H) 4.0 - 10.5 K/uL   RBC 2.91 (L) 3.87 - 5.11 MIL/uL   Hemoglobin 9.5 (L) 12.0 - 15.0 g/dL   HCT 45.4 (L) 09.8 - 11.9 %   MCV 99.3 80.0 - 100.0 fL   MCH 32.6 26.0 - 34.0 pg   MCHC  32.9 30.0 - 36.0 g/dL   RDW 14.7 82.9 - 56.2 %   Platelets 236 150 - 400 K/uL   nRBC 0.0 0.0 - 0.2 %  CBC     Status: Abnormal   Collection Time: 10/29/23  4:56 AM  Result Value Ref Range   WBC 12.1 (H) 4.0 - 10.5 K/uL   RBC 2.77 (L) 3.87 - 5.11 MIL/uL   Hemoglobin 9.3 (L) 12.0 - 15.0 g/dL   HCT 13.0 (L) 86.5 - 78.4 %   MCV 98.2 80.0 - 100.0 fL   MCH 33.6 26.0 - 34.0 pg   MCHC 34.2 30.0 - 36.0 g/dL   RDW 69.6 29.5 - 28.4 %   Platelets 256 150 - 400 K/uL   nRBC 0.0 0.0 - 0.2 %  Basic metabolic panel     Status: Abnormal   Collection Time: 10/29/23  4:56 AM  Result Value Ref Range   Sodium 128 (L) 135 - 145 mmol/L   Potassium 4.7 3.5 - 5.1 mmol/L   Chloride 96 (L) 98 - 111 mmol/L   CO2 23 22 - 32 mmol/L   Glucose, Bld 97 70 - 99 mg/dL   BUN 10 8 - 23 mg/dL   Creatinine, Ser 1.32 0.44 - 1.00 mg/dL  Calcium 8.7 (L) 8.9 - 10.3 mg/dL   GFR, Estimated >32 >35 mL/min   Anion gap 9 5 - 15  Magnesium     Status: None   Collection Time: 10/29/23  4:56 AM  Result Value Ref Range   Magnesium 2.1 1.7 - 2.4 mg/dL     Studies/Results Radiology     MEDS, Scheduled  acetaminophen  1,000 mg Oral Q6H   arformoterol  15 mcg Nebulization BID   atorvastatin  80 mg Oral Daily   budesonide (PULMICORT) nebulizer solution  0.5 mg Nebulization BID   cyanocobalamin  1,000 mcg Oral Daily   gabapentin  300 mg Oral TID   lidocaine  3 patch Transdermal Q24H   revefenacin  175 mcg Nebulization Daily   sodium chloride  1 g Oral TID WC   Vitamin D (Ergocalciferol)  50,000 Units Oral Q7 days   CXR with no ptx  Assessment: Traumatic pneumothorax R sided rib fractures 5-7 L4 TP fx, mild sup endplate fx    Plan: 63 y/o F s/p GLF Traumatic pneumothorax-  CXR w/o PTX. CT removed 3/24 R sided rib fractures 5-7-  multimodality pain management, wean O2 as able L4 TP fx, mild sup endplate fx- multimodality pain management. Mobilizing in lumbar corset. Outpatient F/U Dr. Franky Macho. Back hematoma  - plan to resume ASA/lovenox tomorrow 3/26. monitor. No role for further imaging at this time. Traumatic muscular hematomas usually resolve without acute intervention. ABL anemia - hgb stabilizing at 9.3 from 9.5 yesterday afternoon, I do suspect hematoma is main source of blood loss. HR 90's, BP 107-100 systolic (from 130 yesterday). Monitor.  Pain: 1,000 mg tylenol q 6h, stop toradol, increase oxy to 5-10 mg PRN, lidoderm patch, dilaudid for breakthrough FEN: Reg diet, 1200 mL fluid restriction, on salt tabs for hyponatremia (improving 128 from 122) ID: none indicated VTE: BID lovneox 3/22-3/23 for ppx, hold DVT ppx given ABL anemia as above.  Dispo: progressive care, PT/OT HH PT/OT and home O2 have been ordered  EKG to evaluate possible afib. 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 Chestine Spore of her admission RLL airspace disease concerning for aspiration pneumonitis Chronic COPD: cont home meds, aggressive pulmonary toilet, baseline O2 90-91% per patient H/O CAD, HTN: cont home meds Lung nodule LLL: outpatient f/u Obesity      LOS: 3 days   Hosie Spangle, [facility] Please see Amion for pager number during day hours 7:00am-4:30pm        10/29/2023 9:17 AM

## 2023-10-29 NOTE — Progress Notes (Signed)
 Physical Therapy Treatment Patient Details Name: Denise [name] MRN: [id number] DOB: 12-May-1961 Today's Date: 10/29/2023   History of Present Illness Pt is a 63 y.o. female presenting 3/21 with R rib pain, lower back pain, and neck pain after fall at home. CXR revealed PTX; chest tube placed 3/22-3/24. Also found to have R sided rib 5-7 fx, L4 TP fx, mild sup endplate fx, back hematoma, and ABLA. CT cervical spine negative. 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    PT Comments  Patient seen eating her breakfast in bed, upon entry. Today's session focused on improving tolerance with stair negotiation and when ambulating. Patient able to perform a log roll towards her L side independently. Patient requires CGA for sit > stand transfer and when ascending/descending 3 steps. Under supervision, patient able to ambulate 274ft with a RW around the unit with multiple standing rest breaks. Patient educated on performing pursed-lip breathing in conjunction with increasing O2 to limit desaturation. Overall, patient is progressing well and will need further acute PT sessions to address endurance and wean from DME use.   Vitals: (Supine) HFNC O2 85% on 7L, (Seated EOB) O2 90% on 8L, (Standing) O2 91% on 8L, (Walking) O2 87-92% on 10L with activity and pursed lip breathing    If plan is discharge home, recommend the following: A little help with walking and/or transfers;Help with stairs or ramp for entrance;Assistance with cooking/housework;Direct supervision/assist for medications management;Direct supervision/assist for financial management;Assist for transportation;Supervision due to cognitive status   Can travel by private vehicle        Equipment Recommendations  Rolling walker (2 wheels);BSC/3in1    Recommendations for Other  Services       Precautions / Restrictions Precautions Precautions: Fall;Back Precaution Booklet Issued: Yes (comment) Recall of Precautions/Restrictions: Intact Required Braces or Orthoses: Spinal Brace Spinal Brace: Lumbar corset Restrictions Weight Bearing Restrictions Per Provider Order: No     Mobility  Bed Mobility Overal bed mobility: Independent             General bed mobility comments: Performs a log roll onto her L side, with minimal cueing    Transfers Overall transfer level: Needs assistance Equipment used: Rolling walker (2 wheels) Transfers: Sit to/from Stand Sit to Stand: Contact guard assist           General transfer comment: Cues for hand placement for transfer    Ambulation/Gait Ambulation/Gait assistance: Supervision Gait Distance (Feet): 270 Feet Assistive device: Rolling walker (2 wheels) Gait Pattern/deviations: Step-through pattern, Decreased stride length Gait velocity: Decreased     General Gait Details: Patient reports increased low back pain when ambulating   Stairs Stairs: Yes Stairs assistance: Contact guard assist Stair Management: One rail Left, Step to pattern, Forwards Number of Stairs: 3     Wheelchair Mobility     Tilt Bed    Modified Rankin (Stroke Patients Only)       Balance Overall balance assessment: Needs assistance Sitting-balance support: No upper extremity supported, Feet supported Sitting balance-Leahy Scale: Good     Standing balance support: During functional activity, Reliant on assistive device for balance Standing balance-Leahy Scale: Fair                              Musician Communication: No apparent difficulties  Cognition Arousal: Alert  Behavior During Therapy: WFL for tasks assessed/performed   PT - Cognitive impairments: No apparent impairments (Simultaneous filing. User may not have seen previous data.)                          Following commands: Intact      Cueing Cueing Techniques: Verbal cues, Gestural cues  Exercises      General Comments        Pertinent Vitals/Pain Pain Assessment Pain Assessment: Faces Faces Pain Scale: Hurts little more Pain Location: back/ribs Pain Descriptors / Indicators: Sore Pain Intervention(s): Monitored during session    Home Living                          Prior Function            PT Goals (current goals can now be found in the care plan section) Acute Rehab PT Goals Patient Stated Goal: return to independence, reduce pain PT Goal Formulation: With patient/family Time For Goal Achievement: 11/11/23 Potential to Achieve Goals: Good Progress towards PT goals: Progressing toward goals    Frequency    Min 2X/week      PT Plan      Co-evaluation              AM-PAC PT "6 Clicks" Mobility   Outcome Measure  Help needed turning from your back to your side while in a flat bed without using bedrails?: None Help needed moving from lying on your back to sitting on the side of a flat bed without using bedrails?: None Help needed moving to and from a bed to a chair (including a wheelchair)?: A Little Help needed standing up from a chair using your arms (e.g., wheelchair or bedside chair)?: A Little Help needed to walk in hospital room?: A Little Help needed climbing 3-5 steps with a railing? : A Little 6 Click Score: 20    End of Session Equipment Utilized During Treatment: Gait belt;Back brace;Oxygen Activity Tolerance: Patient tolerated treatment well Patient left: in chair;with call bell/phone within reach;with chair alarm set Nurse Communication: Mobility status PT Visit Diagnosis: Unsteadiness on feet (R26.81);Other abnormalities of gait and mobility (R26.89);Pain Pain - part of body:  (Back/Ribs)     Time: 1610-9604 PT Time Calculation (min) (ACUTE ONLY): 44 min  Charges:    $Therapeutic Activity: 38-52 mins PT General  Charges $$ ACUTE PT VISIT: 1 Visit                     Doreen Beam, SPT    Denise [name] 10/29/2023, 10:35 AM

## 2023-10-29 NOTE — TOC Progression Note (Signed)
 Transition of Care [facility]) - Progression Note    Patient Details  Name: Denise [name] MRN: [id number] Date of Birth: 1960/08/17  Transition of Care [facility]) CM/SW Contact  Glennon Mac, RN Phone Number: 10/29/2023, 11:40am  Clinical Narrative:    Met with patient to discuss discharge plans: she is anxious about having "strangers" coming into her home with [facility] care.  Patient states that her daughter will be able to provide intermittent assistance; she is reluctantly agreeable to [facility] follow up and DME. She understands that she will be discharged on home oxygen; she required 10L with ambulation this morning.  She is overwhelmed by all of the changes that are occurring, but is accepting.   Referral to [facility] for HHPT/OT, as agency in network with insurance.  Will refer to [facility] for RW, BSC and home oxygen.  Possible DC next 1-2 days.     Expected Discharge Plan: Home w Home Health Services Barriers to Discharge: Continued Medical Work up  Expected Discharge Plan and Services   Discharge Planning Services: CM Consult Post Acute Care Choice: Home Health Living arrangements for the past 2 months: Single Family Home                 DME Arranged: Bedside commode, Walker rolling, Oxygen DME Agency: [facility] Date DME Agency Contacted: 10/29/23 Time DME Agency Contacted: [phone] Representative spoke with at DME Agency: [facility] HH Arranged: PT, OT HH Agency: [facility] Date [facility] Agency Contacted: 10/29/23 Time HH Agency Contacted: [phone] Representative spoke with at [facility] Agency: Amy Hyatt   Social Determinants of Health (SDOH) Interventions SDOH Screenings   Food Insecurity: No Food Insecurity (10/27/2023)  Housing: Patient Declined (10/27/2023)  Transportation Needs: No Transportation Needs (04/15/2023)  Alcohol Screen: Low Risk  (04/15/2023)  Depression (PHQ2-9): Medium Risk (09/18/2023)  Financial Resource Strain: Low Risk  (04/15/2023)  Physical Activity:  Insufficiently Active (04/15/2023)  Social Connections: Unknown (04/15/2023)  Stress: Stress Concern Present (04/15/2023)  Tobacco Use: Medium Risk (10/28/2023)    Readmission Risk Interventions     No data to display         Quintella Baton, RN, BSN  Trauma/Neuro ICU Case Manager [phone]

## 2023-10-29 NOTE — Plan of Care (Signed)

## 2023-10-30 DIAGNOSIS — S270XXA Traumatic pneumothorax, initial encounter: Secondary | ICD-10-CM | POA: Diagnosis not present

## 2023-10-30 DIAGNOSIS — W19XXXA Unspecified fall, initial encounter: Secondary | ICD-10-CM | POA: Diagnosis not present

## 2023-10-30 DIAGNOSIS — E8729 Other acidosis: Secondary | ICD-10-CM | POA: Diagnosis not present

## 2023-10-30 DIAGNOSIS — I2510 Atherosclerotic heart disease of native coronary artery without angina pectoris: Secondary | ICD-10-CM | POA: Diagnosis not present

## 2023-10-30 LAB — COMPREHENSIVE METABOLIC PANEL
ALT: 33 U/L (ref 0–44)
AST: 28 U/L (ref 15–41)
Albumin: 2.6 g/dL — ABNORMAL LOW (ref 3.5–5.0)
Alkaline Phosphatase: 70 U/L (ref 38–126)
Anion gap: 7 (ref 5–15)
BUN: 6 mg/dL — ABNORMAL LOW (ref 8–23)
CO2: 29 mmol/L (ref 22–32)
Calcium: 8.7 mg/dL — ABNORMAL LOW (ref 8.9–10.3)
Chloride: 96 mmol/L — ABNORMAL LOW (ref 98–111)
Creatinine, Ser: 0.53 mg/dL (ref 0.44–1.00)
GFR, Estimated: >60 mL/min (ref >60)
Glucose, Bld: 107 mg/dL — ABNORMAL HIGH (ref 70–99)
Potassium: 5.1 mmol/L (ref 3.5–5.1)
Sodium: 132 mmol/L — ABNORMAL LOW (ref 135–145)
Total Bilirubin: 0.9 mg/dL (ref 0.0–1.2)
Total Protein: 5.5 g/dL — ABNORMAL LOW (ref 6.5–8.1)

## 2023-10-30 LAB — CBC
HCT: 25.7 % — ABNORMAL LOW (ref 36.0–46.0)
Hemoglobin: 8.5 g/dL — ABNORMAL LOW (ref 12.0–15.0)
MCH: 33.2 pg (ref 26.0–34.0)
MCHC: 33.1 g/dL (ref 30.0–36.0)
MCV: 100.4 fL — ABNORMAL HIGH (ref 80.0–100.0)
Platelets: 270 10*3/uL (ref 150–400)
RBC: 2.56 MIL/uL — ABNORMAL LOW (ref 3.87–5.11)
RDW: 14.6 % (ref 11.5–15.5)
WBC: 13.2 10*3/uL — ABNORMAL HIGH (ref 4.0–10.5)
nRBC: 0 % (ref 0.0–0.2)

## 2023-10-30 LAB — PHOSPHORUS: Phosphorus: 2.9 mg/dL (ref 2.5–4.6)

## 2023-10-30 LAB — PROTIME-INR
INR: 0.9 (ref 0.8–1.2)
Prothrombin Time: 12.7 s (ref 11.4–15.2)

## 2023-10-30 LAB — MAGNESIUM: Magnesium: 2 mg/dL (ref 1.7–2.4)

## 2023-10-30 MED ORDER — POLYETHYLENE GLYCOL 3350 17 G PO PACK
17.0000 g | PACK | Freq: Every day | ORAL | Status: DC
  Administered 2023-10-30: 17 g via ORAL
  Filled 2023-10-30 (×2): qty 1

## 2023-10-30 MED ORDER — METHOCARBAMOL 500 MG PO TABS
500.0000 mg | ORAL_TABLET | Freq: Three times a day (TID) | ORAL | Status: DC
  Administered 2023-10-30 – 2023-10-31 (×4): 500 mg via ORAL
  Filled 2023-10-30 (×4): qty 1

## 2023-10-30 MED ORDER — DOCUSATE SODIUM 100 MG PO CAPS
100.0000 mg | ORAL_CAPSULE | Freq: Two times a day (BID) | ORAL | Status: DC
  Administered 2023-10-30 – 2023-10-31 (×3): 100 mg via ORAL
  Filled 2023-10-30 (×3): qty 1

## 2023-10-30 MED ORDER — ESCITALOPRAM OXALATE 10 MG PO TABS
20.0000 mg | ORAL_TABLET | Freq: Every day | ORAL | Status: DC
  Administered 2023-10-30 – 2023-10-31 (×2): 20 mg via ORAL
  Filled 2023-10-30 (×2): qty 2

## 2023-10-30 NOTE — Progress Notes (Signed)
 Traumatic pneumothorax  Subjective: Sitting up in bathroom, feels discouraged by her hgb drop. She wants to be home and out of the hospital. Cc is right chest wall and back pain. Tolerating PO. +flatus   Objective: Vital signs in last 24 hours: Temp:  [98.3 F (36.8 C)-98.8 F (37.1 C)] 98.4 F (36.9 C) (03/26 0747) Pulse Rate:  [75-172] 75 (03/26 0747) Resp:  [16-20] 20 (03/26 0747) BP: (113-146)/(63-83) 123/63 (03/26 0747) SpO2:  [93 %-98 %] 95 % (03/26 0747) Last BM Date : 10/26/23  Intake/Output from previous day: 03/25 0701 - 03/26 0700 In: 480 [P.O.:480] Out: -  Intake/Output this shift: No intake/output data recorded.  General appearance: alert and cooperative CV: HR 80's and regular Resp: non-labored breathing on HFNC [place] Back: significant echhymosis with fullness and fluctuance over sacrum and right buttock (see image). Hematoma has evolved to include her flanks/lower abdomen.  Lab Results:  Results for orders placed or performed during the hospital encounter of 10/26/23 (from the past 24 hours)  CBC     Status: Abnormal   Collection Time: 10/30/23  5:42 AM  Result Value Ref Range   WBC 13.2 (H) 4.0 - 10.5 K/uL   RBC 2.56 (L) 3.87 - 5.11 MIL/uL   Hemoglobin 8.5 (L) 12.0 - 15.0 g/dL   HCT 40.9 (L) 81.1 - 91.4 %   MCV 100.4 (H) 80.0 - 100.0 fL   MCH 33.2 26.0 - 34.0 pg   MCHC 33.1 30.0 - 36.0 g/dL   RDW 78.2 95.6 - 21.3 %   Platelets 270 150 - 400 K/uL   nRBC 0.0 0.0 - 0.2 %  Comprehensive metabolic panel     Status: Abnormal   Collection Time: 10/30/23  5:42 AM  Result Value Ref Range   Sodium 132 (L) 135 - 145 mmol/L   Potassium 5.1 3.5 - 5.1 mmol/L   Chloride 96 (L) 98 - 111 mmol/L   CO2 29 22 - 32 mmol/L   Glucose, Bld 107 (H) 70 - 99 mg/dL   BUN 6 (L) 8 - 23 mg/dL   Creatinine, Ser 0.86 0.44 - 1.00 mg/dL   Calcium 8.7 (L) 8.9 - 10.3 mg/dL   Total Protein 5.5 (L) 6.5 - 8.1 g/dL   Albumin 2.6 (L) 3.5 - 5.0 g/dL   AST 28 15 - 41 U/L   ALT 33 0 - 44  U/L   Alkaline Phosphatase 70 38 - 126 U/L   Total Bilirubin 0.9 0.0 - 1.2 mg/dL   GFR, Estimated >57 >84 mL/min   Anion gap 7 5 - 15  Phosphorus     Status: None   Collection Time: 10/30/23  5:42 AM  Result Value Ref Range   Phosphorus 2.9 2.5 - 4.6 mg/dL  Magnesium     Status: None   Collection Time: 10/30/23  5:42 AM  Result Value Ref Range   Magnesium 2.0 1.7 - 2.4 mg/dL  Protime-INR     Status: None   Collection Time: 10/30/23  5:42 AM  Result Value Ref Range   Prothrombin Time 12.7 11.4 - 15.2 seconds   INR 0.9 0.8 - 1.2     Studies/Results Radiology     MEDS, Scheduled  acetaminophen  1,000 mg Oral Q6H   arformoterol  15 mcg Nebulization BID   atorvastatin  80 mg Oral Daily   budesonide (PULMICORT) nebulizer solution  0.5 mg Nebulization BID   cyanocobalamin  1,000 mcg Oral Daily   docusate sodium  100 mg Oral  BID   gabapentin  300 mg Oral TID   lidocaine  3 patch Transdermal Q24H   polyethylene glycol  17 g Oral Daily   revefenacin  175 mcg Nebulization Daily   sodium chloride  1 g Oral TID WC   Vitamin D (Ergocalciferol)  50,000 Units Oral Q7 days   CXR with no ptx  Assessment: Traumatic pneumothorax R sided rib fractures 5-7 L4 TP fx, mild sup endplate fx    Plan: 63 y/o F s/p GLF Traumatic pneumothorax-  CXR w/o PTX. CT removed 3/24 R sided rib fractures 5-7-  multimodality pain management, wean O2 as able L4 TP fx, mild sup endplate fx- multimodality pain management. Mobilizing in lumbar corset. Outpatient F/U Dr. Franky Macho. Back hematoma - plan to resume ASA/lovenox tomorrow 3/26. monitor. No role for further imaging at this time. Traumatic muscular hematomas usually resolve without acute intervention. ABL anemia - hgb 9.5 > 9.3 > 8.5 this moirning.  I do suspect hematoma is main source of blood loss. HR 80's, BP 120-140 systolic. Monitor. Her stable vitals and slowly down-trending  hgb over the last 48h argue against any brisk blood loss. I think  CTA would be low-yield and would not change our management of this hematoma. Contnue to hold blood thinners and monitor.  Pain: 1,000 mg tylenol q 6h, add robaxin 500 mg TID, oxy 5-10 mg PRN, lidoderm patch, dilaudid for breakthrough FEN: Reg diet, 1200 mL fluid restriction, on salt tabs for hyponatremia ID: none indicated VTE: BID lovneox 3/22-3/23 -stopped due to anemia and hematoma. 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 Chestine Spore of her admission RLL airspace disease concerning for aspiration pneumonitis Chronic COPD: cont home meds, aggressive pulmonary toilet, baseline O2 90-91% per patient H/O CAD, HTN: cont home meds Lung nodule LLL: outpatient f/u Obesity      LOS: 4 days   Hosie Spangle, [facility] Surgery Please see Amion for pager number during day hours 7:00am-4:30pm        10/30/2023 10:22 AM

## 2023-10-30 NOTE — Progress Notes (Signed)
 Occupational Therapy Treatment Patient Details Name: Denise [name] MRN: [id number] DOB: May 04, 1961 Today's Date: 10/30/2023   History of present illness Pt is a 63 y.o. female presenting 3/21 with R rib pain, lower back pain, and neck pain after fall at home. CXR revealed PTX; chest tube placed 3/22-3/24. Also found to have R sided rib 5-7 fx, L4 TP fx, mild sup endplate fx, back hematoma, and ABLA. CT cervical spine negative. 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  Pt is making continued progress towards acute OT goals. Pt continues to be limited by deficits listed below. Pt required up to 6L supplemental O2 during longer distance ambulation and 3L at rest. Education provided on PLB to increase O2 saturation and reduce SOB. Pt verbalized understanding and with good carryover. Pt completed bed mobility tasks with MOD I, STS from bed to RW with CGA and verbal cues for hand placement, and tolerated functional mobility in hallway using RW with up to supervision. ADLs were completed from seated position with setup assistance. Improvement noted in donning/doffing lumbar corset with verbal cues for sequencing. OT will continue to follow pt acutely to improve independence in functional tasks and activity tolerance/endurance with discharge recommendations of HHOT to ensure safe discharge to home environment.    OT comments        If plan is discharge home, recommend the following:  A little help with walking and/or transfers;A little help with bathing/dressing/bathroom;Assistance with cooking/housework;Assist for transportation;Help with stairs or ramp for entrance   Equipment Recommendations  BSC/3in1    Recommendations for Other Services      Precautions / Restrictions Precautions Precautions: Fall;Back Precaution  Booklet Issued: Yes (comment) Recall of Precautions/Restrictions: Intact Precaution/Restrictions Comments: R eye blind Required Braces or Orthoses: Spinal Brace Spinal Brace: Lumbar corset Restrictions Weight Bearing Restrictions Per Provider Order: No       Mobility Bed Mobility Overal bed mobility: Modified Independent             General bed mobility comments: HOB slightly elevated. No physical assistance required    Transfers Overall transfer level: Needs assistance Equipment used: Rolling walker (2 wheels) Transfers: Sit to/from Stand Sit to Stand: Contact guard assist           General transfer comment: Verbal cues provided for hand placement     Balance Overall balance assessment: Needs assistance Sitting-balance support: No upper extremity supported, Feet supported Sitting balance-Leahy Scale: Good Sitting balance - Comments: donned lumbar brace seated EOB   Standing balance support: During functional activity, Bilateral upper extremity supported Standing balance-Leahy Scale: Fair Standing balance comment: completed functional mobility using RW                           ADL either performed or assessed with clinical judgement   ADL Overall ADL's : Needs assistance/impaired     Grooming: Set up;Sitting Grooming Details (indicate cue type and reason): applied dentures seated in bed         Upper Body Dressing : Set up;Sitting;Cueing for sequencing Upper Body Dressing Details (indicate cue type and reason): donned lumbar corsett seated EOB with verbal cues for sequencing                 Functional mobility during ADLs: Rolling walker (2 wheels);Contact guard assist General ADL Comments: Pt trialed  on room air while performing functional mobility in hallway. Pt desatted to 81% and required up to 6L of O2 for SpO2 >90% during functional task.    Extremity/Trunk Assessment Upper Extremity Assessment Upper Extremity Assessment:  Overall WFL for tasks assessed   Lower Extremity Assessment Lower Extremity Assessment: Defer to PT evaluation        Vision   Vision Assessment?: Wears glasses for reading Additional Comments: R eye visual deficits   Perception Perception Perception: Not tested   Praxis Praxis Praxis: Not tested   Communication Communication Communication: No apparent difficulties   Cognition Arousal: Alert Behavior During Therapy: WFL for tasks assessed/performed Cognition: No apparent impairments                               Following commands: Intact        Cueing   Cueing Techniques: Verbal cues  Exercises      Shoulder Instructions       General Comments VSS on 6L O2    Pertinent Vitals/ Pain       Pain Assessment Pain Assessment: Faces Faces Pain Scale: Hurts little more Pain Location: back/ribs Pain Descriptors / Indicators: Aching, Discomfort, Sore Pain Intervention(s): Premedicated before session, Monitored during session  Home Living                                          Prior Functioning/Environment              Frequency  Min 1X/week        Progress Toward Goals  OT Goals(current goals can now be found in the care plan section)  Progress towards OT goals: Progressing toward goals  Acute Rehab OT Goals Patient Stated Goal: to feel better OT Goal Formulation: With patient Time For Goal Achievement: 11/11/23 Potential to Achieve Goals: Good ADL Goals Pt Will Perform Grooming: with modified independence;standing Pt Will Perform Lower Body Dressing: with modified independence;sit to/from stand Pt Will Transfer to Toilet: with modified independence;regular height toilet;ambulating Pt Will Perform Tub/Shower Transfer: Tub transfer;with supervision;3 in 1  Plan      Co-evaluation                 AM-PAC OT "6 Clicks" Daily Activity     Outcome Measure   Help from another person eating meals?:  None Help from another person taking care of personal grooming?: A Little Help from another person toileting, which includes using toliet, bedpan, or urinal?: A Little Help from another person bathing (including washing, rinsing, drying)?: A Little Help from another person to put on and taking off regular upper body clothing?: A Little Help from another person to put on and taking off regular lower body clothing?: A Little 6 Click Score: 19    End of Session Equipment Utilized During Treatment: Gait belt;Rolling walker (2 wheels)  OT Visit Diagnosis: Unsteadiness on feet (R26.81);Muscle weakness (generalized) (M62.81);Pain Pain - part of body:  (back, ribs)   Activity Tolerance Patient tolerated treatment well   Patient Left in bed;with call bell/phone within reach   Nurse Communication Mobility status        Time: 1610-9604 OT Time Calculation (min): 38 min  Charges: OT General Charges $OT Visit: 1 Visit OT Treatments $Self Care/Home Management : 23-37 mins $Therapeutic Activity: 8-22 mins  Vonnie Spagnolo, MOTS  Kevan Ny 10/30/2023, 1:49 PM

## 2023-10-30 NOTE — Progress Notes (Signed)
 PROGRESS NOTE  Denise [name] NWG:[id number] DOB: [birth year]   PCP: Doreene Nest, NP  Patient is from: Home.  Lives with family.  DOA: 10/26/2023 LOS: 4  Chief complaints Chief Complaint  Patient presents with   Fall     Brief Narrative / Interim history: 63 year old F with PMH of CAD, carotid artery stenosis, COPD, HTN, osteoarthritis, anxiety, depression, migraine, lung nodule and prior tobacco use brought to ED after accidental fall off her porch to grass ground, and admitted by trauma team due to traumatic right pneumothorax with multiple right-sided rib fractures, thoracic spine fractures and hematoma.  Hospitalist service consulted for medical management including hyponatremia and other underlying comorbidities.  Hospital course complicated by ABLA likely from hematoma.  Subjective: Seen and examined earlier this morning.  No major events overnight of this morning.  No major complaints other than pain from hematoma and rib fracture.  She rates her pain 4/10.  Described the pain as burning.  Rib fracture is worse with deep breathing.  She is eager to go home.  Frustrated that she has to stay in the hospital but she understands.   Objective: Vitals:   10/29/23 2244 10/30/23 0339 10/30/23 0747 10/30/23 1229  BP: (!) 140/83 (!) 146/79 123/63 (!) 120/52  Pulse: 89 85 75 88  Resp: 16 20 20 17   Temp: 98.3 F (36.8 C) 98.6 F (37 C) 98.4 F (36.9 C) 98.1 F (36.7 C)  TempSrc: Oral Oral Oral Oral  SpO2: 93% 94% 95% 90%  Weight:      Height:        Examination:  GENERAL: No apparent distress.  Nontoxic.  Sitting on bedside chair. HEENT: MMM.  Vision and hearing grossly intact.  NECK: Supple.  No apparent JVD.  RESP:  No IWOB.  Fair aeration bilaterally.  CVS:  RRR. Heart sounds normal.  ABD/GI/GU: BS+. Abd soft, NTND.  MSK/EXT:  Moves extremities. No apparent deformity. No edema.   SKIN: Ecchymosis over lower back.  Large hematoma from mid lower back to right  upper buttock. NEURO: Awake, alert and oriented appropriately.  No apparent focal neuro deficit. PSYCH: Calm. Normal affect.   Consultants:  TRH  Procedures: 3/22-chest tube placement 3/24-chest tube removed  Microbiology summarized: None  Assessment and plan: Fall at home: Larey Seat off 3 feet high porch to grass ground.  Patient with poor right eye vision, depth perception and dizziness due to carotid artery stenosis. -Fall precaution -PT/OT eval   Traumatic rib fractures Traumatic thoracic spine fractures Traumatic right pneumothorax with mediastinal and cutaneous emphysema Acute respiratory failure with hypoxia: Desaturated to 80% with ambulation on room air.  -Treated with chest tube from 3/22-3/24.  Pneumothorax resolved. -Per trauma team/primary team  Acute blood loss anemia likely due to lower back hematoma: Baseline Hgb about 14.5.  Lost about 5 g likely due to large lower back hematoma and ecchymosis.  Hgb dropped and is a 1 g.  Hemodynamically stable. Recent Labs    12/27/22 1003 02/21/23 1301 10/24/23 0915 10/26/23 0541 10/26/23 0542 10/27/23 0437 10/28/23 0723 10/28/23 1202 10/29/23 0456 10/30/23 0542  HGB 14.6 14.5 16.5* 16.2* 18.4* 14.2 10.1* 9.5* 9.3* 8.5*  -Continue holding Lovenox, aspirin and Toradol -SCD for VTE prophylaxis-emphasized the importance of wearing this  Aspiration pneumonitis: CT angio chest shows confluent airspace opacity in RLL and early involvement in RML.  Doubt pneumonia.  No respiratory distress. -Monitor off antibiotics -Pulmonary toilet -Aspiration precaution   Symptomatic carotid artery stenosis: Patient reports poor vision  with poor depth perception and right eye and intermittent dizziness that she attributes to this.  She is scheduled for CEA by vascular surgery on 11/01/2023. -Vascular surgery notified by primary team. -Continue statin.  Holding aspirin due to hematoma   Hyponatremia: Sodium trended from 130>>>120>>> 132..   Worse after IV NS raising concern for SIADH but urine sodium only 14.  Urine osmolality elevated to 498.  TSH normal.  She has lung nodule.  Also Lexapro and lisinopril at home.  Improving with p.o. NaCl and fluid restriction. -Continue holding lisinopril.  Will resume Lexapro -Continue p.o. NaCl 1 g 3 times daily -Fluid restriction to less than 1200 cc a day -Recheck BMP in the morning  History of CAD: No anginal symptoms.  Coronary CT on 3/9 with hemodynamically insignificant CAD. -Continue home aspirin and Lipitor.   Essential hypertension: Soft blood pressure today. -Discontinue amlodipine.  Has already received morning dose. -Continue holding lisinopril  Slowly enlarging lung nodule: known slowly enlarging LLL nodule.  Patient with 20-pack-year history before she quit in 05/2023. -Outpatient follow-up.  Chronic COPD: Stable. -Continue scheduled and as needed nebulizers.  Secondary polycythemia: Likely due to dehydration.  Resolved.  Now anemic.   Elevated LFT: Mild.  Improved.  CK normalized.   Anxiety and depression: Stable -Resume home Lexapro.  Will recheck sodium in the morning   Aortic atherosclerosis: Good DP pulses bilaterally. -Continue statin.   Osteopenia: Noted on CT. -Vitamin D supplementation -Needs DEXA scan outpatient  Severe vitamin D deficiency: Vitamin D level 8.28. -Started vitamin D 50,000 units weekly  Paroxysmal atrial contraction: Not tachycardic. -Monitor  Class I obesity Body mass index is 30.56 kg/m.           DVT prophylaxis:  Place and maintain sequential compression device Start: 10/28/23 0854  Code Status: Full code Family Communication: None at bedside Level of care: Progressive Status is: Inpatient   Disposition: Inpatient   55 minutes with more than 50% spent in reviewing records, counseling patient/family and coordinating care.   Sch Meds:  Scheduled Meds:  acetaminophen  1,000 mg Oral Q6H   arformoterol  15 mcg  Nebulization BID   atorvastatin  80 mg Oral Daily   budesonide (PULMICORT) nebulizer solution  0.5 mg Nebulization BID   cyanocobalamin  1,000 mcg Oral Daily   docusate sodium  100 mg Oral BID   gabapentin  300 mg Oral TID   lidocaine  3 patch Transdermal Q24H   methocarbamol  500 mg Oral TID   polyethylene glycol  17 g Oral Daily   revefenacin  175 mcg Nebulization Daily   sodium chloride  1 g Oral TID WC   Vitamin D (Ergocalciferol)  50,000 Units Oral Q7 days   Continuous Infusions:   PRN Meds:.famotidine, HYDROmorphone (DILAUDID) injection, ipratropium-albuterol, labetalol, ondansetron (ZOFRAN) IV, oxyCODONE  Antimicrobials: Anti-infectives (From admission, onward)    None        I have personally reviewed the following labs and images: CBC: Recent Labs  Lab 10/26/23 0541 10/26/23 0542 10/27/23 0437 10/28/23 0723 10/28/23 1202 10/29/23 0456 10/30/23 0542  WBC 17.1*  --  12.1* 12.0* 12.0* 12.1* 13.2*  NEUTROABS 15.0*  --   --   --   --   --   --   HGB 16.2*   < > 14.2 10.1* 9.5* 9.3* 8.5*  HCT 49.7*   < > 42.6 30.5* 28.9* 27.2* 25.7*  MCV 100.8*  --  99.5 101.3* 99.3 98.2 100.4*  PLT 311  --  236 250 236 256 270   < > = values in this interval not displayed.   BMP &GFR Recent Labs  Lab 10/26/23 0541 10/26/23 0542 10/27/23 1342 10/28/23 0723 10/28/23 1202 10/29/23 0456 10/30/23 0542  NA 130*   < > 125* 120* 122* 128* 132*  K 4.0   < > 4.0 4.7 4.6 4.7 5.1  CL 97*   < > 94* 88* 94* 96* 96*  CO2 18*   < > 21* 22 19* 23 29  GLUCOSE 125*   < > 112* 95 106* 97 107*  BUN 7*   < > 9 13 14 10  6*  CREATININE 0.78   < > 0.63 0.93 0.76 0.49 0.53  CALCIUM 9.1   < > 8.9 8.4* 8.5* 8.7* 8.7*  MG 1.7  --  1.8 1.6*  --  2.1 2.0  PHOS 3.3  --   --  3.8  --   --  2.9   < > = values in this interval not displayed.   Estimated Creatinine Clearance: 72.2 mL/min (by C-G formula based on SCr of 0.53 mg/dL). Liver & Pancreas: Recent Labs  Lab 10/24/23 0915  10/26/23 0541 10/27/23 1342 10/28/23 0723 10/30/23 0542  AST 34 66* 87* 43* 28  ALT 28 39 68* 44 33  ALKPHOS 83 79 104 72 70  BILITOT 0.8 0.5 0.9 0.8 0.9  PROT 7.1 6.7 5.9* 5.1* 5.5*  ALBUMIN 3.8 3.7 3.1* 2.7* 2.6*   No results for input(s): "LIPASE", "AMYLASE" in the last 168 hours. No results for input(s): "AMMONIA" in the last 168 hours. Diabetic: No results for input(s): "HGBA1C" in the last 72 hours. No results for input(s): "GLUCAP" in the last 168 hours. Cardiac Enzymes: Recent Labs  Lab 10/27/23 1342 10/28/23 0723  CKTOTAL 422* 215   No results for input(s): "PROBNP" in the last 8760 hours. Coagulation Profile: Recent Labs  Lab 10/24/23 0915 10/26/23 0541 10/30/23 0542  INR 0.9 0.9 0.9   Thyroid Function Tests: No results for input(s): "TSH", "T4TOTAL", "FREET4", "T3FREE", "THYROIDAB" in the last 72 hours.  Lipid Profile: No results for input(s): "CHOL", "HDL", "LDLCALC", "TRIG", "CHOLHDL", "LDLDIRECT" in the last 72 hours. Anemia Panel: No results for input(s): "VITAMINB12", "FOLATE", "FERRITIN", "TIBC", "IRON", "RETICCTPCT" in the last 72 hours. Urine analysis:    Component Value Date/Time   COLORURINE AMBER (A) 10/24/2023 0942   APPEARANCEUR CLEAR 10/24/2023 0942   LABSPEC 1.017 10/24/2023 0942   PHURINE 5.0 10/24/2023 0942   GLUCOSEU NEGATIVE 10/24/2023 0942   HGBUR NEGATIVE 10/24/2023 0942   BILIRUBINUR NEGATIVE 10/24/2023 0942   BILIRUBINUR neg 12/27/2022 0955   KETONESUR NEGATIVE 10/24/2023 0942   PROTEINUR NEGATIVE 10/24/2023 0942   UROBILINOGEN 0.2 12/27/2022 0955   UROBILINOGEN 0.2 03/29/2012 2003   NITRITE NEGATIVE 10/24/2023 0942   LEUKOCYTESUR NEGATIVE 10/24/2023 0942   Sepsis Labs: Invalid input(s): "PROCALCITONIN", "LACTICIDVEN"  Microbiology: Recent Results (from the past 240 hours)  Surgical pcr screen     Status: None   Collection Time: 10/24/23  8:59 AM   Specimen: Nasal Mucosa; Nasal Swab  Result Value Ref Range Status    MRSA, PCR NEGATIVE NEGATIVE Final   Staphylococcus aureus NEGATIVE NEGATIVE Final    Comment: (NOTE) The Xpert SA Assay (FDA approved for NASAL specimens in patients 89 years of age and older), is one component of a comprehensive surveillance program. It is not intended to diagnose infection nor to guide or monitor treatment. Performed at [facility], [address], [place], [place] [address]     Radiology Studies: No results found.     Lenay Lovejoy T. Kristyna Bradstreet [facility]  If 7PM-7AM, please contact night-coverage [email or web address] 10/30/2023, 2:15 PM

## 2023-10-30 NOTE — Plan of Care (Signed)

## 2023-10-30 NOTE — Progress Notes (Signed)
 SATURATION QUALIFICATIONS: (This note is used to comply with regulatory documentation for home oxygen)  Patient Saturations on Room Air at Rest = 85%  Patient Saturations on Room Air while Ambulating = 80%  Patient Saturations on 6 Liters of oxygen while Ambulating = 91%  Please briefly explain why patient needs home oxygen: Pt requires 3L to maintain above 90% SpO2 at rest and up to 6L for longer distance ambulation.    Kevan Ny, MOTS

## 2023-10-31 ENCOUNTER — Ambulatory Visit: Admitting: Primary Care

## 2023-10-31 ENCOUNTER — Other Ambulatory Visit (HOSPITAL_COMMUNITY): Payer: Self-pay

## 2023-10-31 DIAGNOSIS — S270XXA Traumatic pneumothorax, initial encounter: Secondary | ICD-10-CM | POA: Diagnosis not present

## 2023-10-31 DIAGNOSIS — I2510 Atherosclerotic heart disease of native coronary artery without angina pectoris: Secondary | ICD-10-CM | POA: Diagnosis not present

## 2023-10-31 DIAGNOSIS — W19XXXA Unspecified fall, initial encounter: Secondary | ICD-10-CM | POA: Diagnosis not present

## 2023-10-31 DIAGNOSIS — E8729 Other acidosis: Secondary | ICD-10-CM | POA: Diagnosis not present

## 2023-10-31 LAB — BASIC METABOLIC PANEL WITH GFR
Anion gap: 9 (ref 5–15)
BUN: <5 mg/dL — ABNORMAL LOW (ref 8–23)
CO2: 28 mmol/L (ref 22–32)
Calcium: 8.6 mg/dL — ABNORMAL LOW (ref 8.9–10.3)
Chloride: 95 mmol/L — ABNORMAL LOW (ref 98–111)
Creatinine, Ser: 0.5 mg/dL (ref 0.44–1.00)
GFR, Estimated: >60 mL/min (ref >60)
Glucose, Bld: 100 mg/dL — ABNORMAL HIGH (ref 70–99)
Potassium: 4.2 mmol/L (ref 3.5–5.1)
Sodium: 132 mmol/L — ABNORMAL LOW (ref 135–145)

## 2023-10-31 LAB — CBC
HCT: 27 % — ABNORMAL LOW (ref 36.0–46.0)
Hemoglobin: 8.9 g/dL — ABNORMAL LOW (ref 12.0–15.0)
MCH: 33.1 pg (ref 26.0–34.0)
MCHC: 33 g/dL (ref 30.0–36.0)
MCV: 100.4 fL — ABNORMAL HIGH (ref 80.0–100.0)
Platelets: 318 10*3/uL (ref 150–400)
RBC: 2.69 MIL/uL — ABNORMAL LOW (ref 3.87–5.11)
RDW: 14.8 % (ref 11.5–15.5)
WBC: 9.8 10*3/uL (ref 4.0–10.5)
nRBC: 0.9 % — ABNORMAL HIGH (ref 0.0–0.2)

## 2023-10-31 LAB — MAGNESIUM: Magnesium: 1.9 mg/dL (ref 1.7–2.4)

## 2023-10-31 MED ORDER — VITAMIN D (ERGOCALCIFEROL) 1.25 MG (50000 UNIT) PO CAPS: 50000.0000 [IU] | ORAL_CAPSULE | ORAL | 0 refills | Status: DC

## 2023-10-31 MED ORDER — FERROUS SULFATE 325 (65 FE) MG PO TABS
325.0000 mg | ORAL_TABLET | Freq: Every day | ORAL | 0 refills | Status: DC
  Filled 2023-10-31: qty 60, 60d supply, fill #0

## 2023-10-31 MED ORDER — OXYCODONE HCL 5 MG PO TABS
5.0000 mg | ORAL_TABLET | Freq: Four times a day (QID) | ORAL | 0 refills | Status: DC | PRN
  Filled 2023-10-31: qty 15, 4d supply, fill #0

## 2023-10-31 MED ORDER — SODIUM CHLORIDE 1 G PO TABS: 1.0000 g | ORAL_TABLET | Freq: Two times a day (BID) | ORAL | 0 refills | Status: AC

## 2023-10-31 MED ORDER — ACETAMINOPHEN 500 MG PO TABS: 1000.0000 mg | ORAL_TABLET | Freq: Three times a day (TID) | ORAL | Status: AC | PRN

## 2023-10-31 MED ORDER — METHOCARBAMOL 500 MG PO TABS
500.0000 mg | ORAL_TABLET | Freq: Three times a day (TID) | ORAL | 0 refills | Status: DC | PRN
  Filled 2023-10-31: qty 60, 20d supply, fill #0

## 2023-10-31 NOTE — Progress Notes (Signed)
 PROGRESS NOTE  Denise [name] WRU:[id number] DOB: 1961-04-03   PCP: Denise Nest, NP  Patient is from: Home.  Lives with family.  DOA: 10/26/2023 LOS: 5  Chief complaints Chief Complaint  Patient presents with   Fall     Brief Narrative / Interim history: 63 year old F with PMH of CAD, carotid artery stenosis, COPD, HTN, osteoarthritis, anxiety, depression, migraine, lung nodule and prior tobacco use brought to ED after accidental fall off her porch to grass ground, and admitted by trauma team due to traumatic right pneumothorax with multiple right-sided rib fractures, thoracic spine fractures and hematoma.  Hospitalist service consulted for medical management including hyponatremia and other underlying comorbidities.  Hyponatremia improved with sodium tablets and fluid restriction.  She is discharged on sodium tablets and fluid restriction.  Patient had acute blood loss anemia due to hematoma and ecchymosis.  Hemoglobin stabilized at 9.0 after discontinuing Lovenox, aspirin and Toradol.  Recommend holding aspirin for the next few days.   Subjective: Seen and examined earlier this morning.  No major events overnight of this morning.  No complaints other than lack of sleep due to telemetry beeping, wires and some pain.  Otherwise, feels better.  Hemoglobin improved.  Sodium stable at 132.  She is eager to go home.   Objective: Vitals:   10/30/23 2318 10/31/23 0237 10/31/23 0740 10/31/23 1152  BP: (!) 155/87 (!) 153/74 126/74 123/78  Pulse: 93 91 83 95  Resp: 15 17 13 16   Temp: 98.7 F (37.1 C) 98.4 F (36.9 C) 97.8 F (36.6 C) 98.3 F (36.8 C)  TempSrc: Oral Oral Oral Oral  SpO2: 92% 90% 92% 92%  Weight:      Height:        Examination:  GENERAL: No apparent distress.  Nontoxic.  Sitting on bedside chair. HEENT: MMM.  Vision and hearing grossly intact.  NECK: Supple.  No apparent JVD.  RESP:  No IWOB.  Fair aeration bilaterally.  CVS:  RRR. Heart sounds  normal.  ABD/GI/GU: BS+. Abd soft, NTND.  MSK/EXT:  Moves extremities. No apparent deformity. No edema.   SKIN: Ecchymosis over lower back.  Large hematoma from mid lower back to right upper buttock. NEURO: Awake, alert and oriented appropriately.  No apparent focal neuro deficit. PSYCH: Calm. Normal affect.   Consultants:  TRH  Procedures: 3/22-chest tube placement 3/24-chest tube removed  Microbiology summarized: None  Assessment and plan: Fall at home: Larey Seat off 3 feet high porch to grass ground.  Patient with poor right eye vision, depth perception and dizziness due to carotid artery stenosis. -Fall precaution -PT/OT   Traumatic rib fractures Traumatic thoracic spine fractures Traumatic right pneumothorax with mediastinal and cutaneous emphysema Acute respiratory failure with hypoxia: Desaturated to 80% with ambulation on room air and required up to 6 L on 3/26.  Required only 3 L with ambulation today (3/27). -Treated with chest tube from 3/22-3/24.  Pneumothorax resolved. -Ambulatory saturation -Per trauma team/primary team  Acute blood loss anemia likely due to lower back hematoma: Baseline Hgb about 14.5.  Lost about 5 g.  Hemodynamically stable.  H&H stable now. Recent Labs    02/21/23 1301 10/24/23 0915 10/26/23 0541 10/26/23 0542 10/27/23 0437 10/28/23 0723 10/28/23 1202 10/29/23 0456 10/30/23 0542 10/31/23 0551  HGB 14.5 16.5* 16.2* 18.4* 14.2 10.1* 9.5* 9.3* 8.5* 8.9*  -Continue holding Lovenox, aspirin and Toradol -SCD for VTE prophylaxis-emphasized the importance of wearing this -Recommend holding aspirin for few days after discharge  Aspiration pneumonitis: CT  angio chest shows confluent airspace opacity in RLL and early involvement in RML.  Doubt pneumonia.  Oxygen requirement improved.  No respiratory distress. -Monitor off antibiotics -Pulmonary toilet -Aspiration precaution   Symptomatic carotid artery stenosis: Patient reports poor vision with  poor depth perception and right eye and intermittent dizziness that she attributes to this.  She is scheduled for CEA by vascular surgery on 11/01/2023. -Vascular surgery notified by primary team. -Continue statin.  Holding aspirin due to hematoma   Hyponatremia: Sodium trended from 130>>>120>>> 132..  Worse after IV NS raising concern for SIADH but urine sodium only 14.  Urine osmolality elevated to 498.  TSH normal.  She has lung nodule.  Also Lexapro and lisinopril at home.  Improving with p.o. NaCl and fluid restriction. -Discontinue lisinopril.  Okay to continue Lexapro for now.  -Continue p.o. NaCl 1 g twice daily. -Fluid restriction to less than 1200 cc a day -Recheck BMP in 1 to 2 weeks  History of CAD: No anginal symptoms.  Coronary CT on 3/9 with hemodynamically insignificant CAD. -Continue home aspirin and Lipitor.   Essential hypertension: Soft blood pressure today. -Discontinue amlodipine.  Has already received morning dose. -Continue holding lisinopril  Slowly enlarging lung nodule: known slowly enlarging LLL nodule.  Patient with 20-pack-year history before she quit in 05/2023. -Outpatient follow-up per pulmonology.  Chronic COPD: Stable. -Continue home inhalers.  Secondary polycythemia: Likely due to dehydration.  Resolved.  Now anemic.   Elevated LFT: Mild.  Improved.  CK normalized.   Anxiety and depression: Stable -Resume home Lexapro.  Will recheck sodium in the morning   Aortic atherosclerosis: Good DP pulses bilaterally. -Continue statin.   Osteopenia: Noted on CT. -Vitamin D supplementation -Needs DEXA scan outpatient  Severe vitamin D deficiency: Vitamin D level 8.28. -Started vitamin D 50,000 units weekly  Paroxysmal atrial contraction: Not tachycardic. -Monitor  Class I obesity Body mass index is 30.56 kg/m.           DVT prophylaxis:  Place and maintain sequential compression device Start: 10/28/23 0854  Code Status: Full code Family  Communication: None at bedside Level of care: Progressive Status is: Inpatient   Disposition: Inpatient.  Medically stable for discharge from our standpoint   55 minutes with more than 50% spent in reviewing records, counseling patient/family and coordinating care.   Sch Meds:  Scheduled Meds:  acetaminophen  1,000 mg Oral Q6H   arformoterol  15 mcg Nebulization BID   atorvastatin  80 mg Oral Daily   budesonide (PULMICORT) nebulizer solution  0.5 mg Nebulization BID   cyanocobalamin  1,000 mcg Oral Daily   docusate sodium  100 mg Oral BID   escitalopram  20 mg Oral Daily   gabapentin  300 mg Oral TID   lidocaine  3 patch Transdermal Q24H   methocarbamol  500 mg Oral TID   polyethylene glycol  17 g Oral Daily   revefenacin  175 mcg Nebulization Daily   sodium chloride  1 g Oral TID WC   Vitamin D (Ergocalciferol)  50,000 Units Oral Q7 days   Continuous Infusions:   PRN Meds:.famotidine, HYDROmorphone (DILAUDID) injection, ipratropium-albuterol, ondansetron (ZOFRAN) IV, oxyCODONE  Antimicrobials: Anti-infectives (From admission, onward)    None        I have personally reviewed the following labs and images: CBC: Recent Labs  Lab 10/26/23 0541 10/26/23 0542 10/28/23 0723 10/28/23 1202 10/29/23 0456 10/30/23 0542 10/31/23 0551  WBC 17.1*   < > 12.0* 12.0* 12.1* 13.2* 9.8  NEUTROABS 15.0*  --   --   --   --   --   --   HGB 16.2*   < > 10.1* 9.5* 9.3* 8.5* 8.9*  HCT 49.7*   < > 30.5* 28.9* 27.2* 25.7* 27.0*  MCV 100.8*   < > 101.3* 99.3 98.2 100.4* 100.4*  PLT 311   < > 250 236 256 270 318   < > = values in this interval not displayed.   BMP &GFR Recent Labs  Lab 10/26/23 0541 10/26/23 0542 10/27/23 1342 10/28/23 0723 10/28/23 1202 10/29/23 0456 10/30/23 0542 10/31/23 0551  NA 130*   < > 125* 120* 122* 128* 132* 132*  K 4.0   < > 4.0 4.7 4.6 4.7 5.1 4.2  CL 97*   < > 94* 88* 94* 96* 96* 95*  CO2 18*   < > 21* 22 19* 23 29 28   GLUCOSE 125*   < >  112* 95 106* 97 107* 100*  BUN 7*   < > 9 13 14 10  6* <5*  CREATININE 0.78   < > 0.63 0.93 0.76 0.49 0.53 0.50  CALCIUM 9.1   < > 8.9 8.4* 8.5* 8.7* 8.7* 8.6*  MG 1.7  --  1.8 1.6*  --  2.1 2.0 1.9  PHOS 3.3  --   --  3.8  --   --  2.9  --    < > = values in this interval not displayed.   Estimated Creatinine Clearance: 72.2 mL/min (by C-G formula based on SCr of 0.5 mg/dL). Liver & Pancreas: Recent Labs  Lab 10/26/23 0541 10/27/23 1342 10/28/23 0723 10/30/23 0542  AST 66* 87* 43* 28  ALT 39 68* 44 33  ALKPHOS 79 104 72 70  BILITOT 0.5 0.9 0.8 0.9  PROT 6.7 5.9* 5.1* 5.5*  ALBUMIN 3.7 3.1* 2.7* 2.6*   No results for input(s): "LIPASE", "AMYLASE" in the last 168 hours. No results for input(s): "AMMONIA" in the last 168 hours. Diabetic: No results for input(s): "HGBA1C" in the last 72 hours. No results for input(s): "GLUCAP" in the last 168 hours. Cardiac Enzymes: Recent Labs  Lab 10/27/23 1342 10/28/23 0723  CKTOTAL 422* 215   No results for input(s): "PROBNP" in the last 8760 hours. Coagulation Profile: Recent Labs  Lab 10/26/23 0541 10/30/23 0542  INR 0.9 0.9   Thyroid Function Tests: No results for input(s): "TSH", "T4TOTAL", "FREET4", "T3FREE", "THYROIDAB" in the last 72 hours.  Lipid Profile: No results for input(s): "CHOL", "HDL", "LDLCALC", "TRIG", "CHOLHDL", "LDLDIRECT" in the last 72 hours. Anemia Panel: No results for input(s): "VITAMINB12", "FOLATE", "FERRITIN", "TIBC", "IRON", "RETICCTPCT" in the last 72 hours. Urine analysis:    Component Value Date/Time   COLORURINE AMBER (A) 10/24/2023 0942   APPEARANCEUR CLEAR 10/24/2023 0942   LABSPEC 1.017 10/24/2023 0942   PHURINE 5.0 10/24/2023 0942   GLUCOSEU NEGATIVE 10/24/2023 0942   HGBUR NEGATIVE 10/24/2023 0942   BILIRUBINUR NEGATIVE 10/24/2023 0942   BILIRUBINUR neg 12/27/2022 0955   KETONESUR NEGATIVE 10/24/2023 0942   PROTEINUR NEGATIVE 10/24/2023 0942   UROBILINOGEN 0.2 12/27/2022 0955    UROBILINOGEN 0.2 03/29/2012 2003   NITRITE NEGATIVE 10/24/2023 0942   LEUKOCYTESUR NEGATIVE 10/24/2023 0942   Sepsis Labs: Invalid input(s): "PROCALCITONIN", "LACTICIDVEN"  Microbiology: Recent Results (from the past 240 hours)  Surgical pcr screen     Status: None   Collection Time: 10/24/23  8:59 AM   Specimen: Nasal Mucosa; Nasal Swab  Result Value Ref Range Status  MRSA, PCR NEGATIVE NEGATIVE Final   Staphylococcus aureus NEGATIVE NEGATIVE Final    Comment: (NOTE) The Xpert SA Assay (FDA approved for NASAL specimens in patients 92 years of age and older), is one component of a comprehensive surveillance program. It is not intended to diagnose infection nor to guide or monitor treatment. Performed at [facility], [address] [place], [place] [address]     Radiology Studies: No results found.     Denise [name] T. Denise [name] [facility]  If 7PM-7AM, please contact night-coverage [email or web address] 10/31/2023, 12:05 PM

## 2023-10-31 NOTE — Plan of Care (Signed)
  Problem: Clinical Measurements: Goal: Ability to maintain clinical measurements within normal limits will improve Outcome: Progressing Goal: Respiratory complications will improve Outcome: Progressing   Problem: Activity: Goal: Risk for activity intolerance will decrease Outcome: Progressing   Problem: Coping: Goal: Level of anxiety will decrease Outcome: Progressing   Problem: Pain Managment: Goal: General experience of comfort will improve and/or be controlled Outcome: Progressing   Problem: Safety: Goal: Ability to remain free from injury will improve Outcome: Progressing   Problem: Skin Integrity: Goal: Risk for impaired skin integrity will decrease Outcome: Progressing

## 2023-10-31 NOTE — Progress Notes (Signed)
 Physical Therapy Treatment and Discharge Patient Details Name: Denise [name] MRN: [id number] DOB: Jan 10, 1961 Today's Date: 10/31/2023   History of Present Illness Pt is a 63 y.o. female presenting 3/21 with R rib pain, lower back pain, and neck pain after fall at home. CXR revealed PTX; chest tube placed 3/22-3/24. Also found to have R sided rib 5-7 fx, L4 TP fx, mild sup endplate fx, back hematoma, and ABLA. CT cervical spine negative. 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    PT Comments  Patient agreeable to participate with therapy and was seen sitting in her recliner, upon entry. Session focused on re-assessing transfers, bed mobility, and tolerance to functional mobility. Under supervision, patient able to demonstrate bed mobility transfers, sit > stand transfer, and ambulate 121ft with a RW on 3L. Overall, patient is progressing well and non longer requires acute PT needs. Further rehabilitation can be addressed with HHPT to improve her functional independence.  O2 on 3L: 88-92%    If plan is discharge home, recommend the following: A little help with walking and/or transfers;Help with stairs or ramp for entrance;Assistance with cooking/housework;Direct supervision/assist for medications management;Direct supervision/assist for financial management;Assist for transportation;Supervision due to cognitive status   Can travel by private vehicle        Equipment Recommendations  Rolling walker (2 wheels);BSC/3in1    Recommendations for Other Services       Precautions / Restrictions Precautions Precautions: Fall;Back Precaution Booklet Issued: Yes (comment) Recall of Precautions/Restrictions: Intact Precaution/Restrictions Comments: R eye blind Required Braces or Orthoses: Spinal Brace Spinal Brace: Lumbar  corset Restrictions Weight Bearing Restrictions Per Provider Order: No     Mobility  Bed Mobility Overal bed mobility: Needs Assistance Bed Mobility: Sit to Supine       Sit to supine: Supervision   General bed mobility comments: Able to transition herself back into bed following session    Transfers Overall transfer level: Needs assistance Equipment used: Rolling walker (2 wheels) Transfers: Sit to/from Stand Sit to Stand: Supervision           General transfer comment: Verbal cues provided for hand placement    Ambulation/Gait Ambulation/Gait assistance: Supervision Gait Distance (Feet): 150 Feet Assistive device: Rolling walker (2 wheels) Gait Pattern/deviations: Step-through pattern, Decreased stride length Gait velocity: Decreased     General Gait Details: Patient reports increased low back pain which radiates into the posterior leg   Stairs             Wheelchair Mobility     Tilt Bed    Modified Rankin (Stroke Patients Only)       Balance Overall balance assessment: Needs assistance Sitting-balance support: No upper extremity supported, Feet supported Sitting balance-Leahy Scale: Good Sitting balance - Comments: Doffed lumbar brace once back into bed   Standing balance support: During functional activity, Bilateral upper extremity supported Standing balance-Leahy Scale: Fair Standing balance comment: Completed functional mobility using RW                            Communication Communication Communication: No apparent difficulties  Cognition Arousal: Alert Behavior During Therapy: WFL for tasks assessed/performed   PT - Cognitive impairments: No apparent impairments  Following commands: Intact      Cueing Cueing Techniques: Verbal cues  Exercises      General Comments General comments (skin integrity, edema, etc.): Up to 3L of O2 (88-92%)      Pertinent Vitals/Pain Pain  Assessment Pain Assessment: Faces Faces Pain Scale: Hurts a little bit Pain Location: back/ribs Pain Descriptors / Indicators: Aching, Discomfort, Sore Pain Intervention(s): Monitored during session, Repositioned    Home Living                          Prior Function            PT Goals (current goals can now be found in the care plan section) Acute Rehab PT Goals Patient Stated Goal: return to independence, reduce pain PT Goal Formulation: With patient/family Time For Goal Achievement: 11/11/23 Potential to Achieve Goals: Good Progress towards PT goals: Progressing toward goals    Frequency    Min 2X/week      PT Plan      Co-evaluation              AM-PAC PT "6 Clicks" Mobility   Outcome Measure  Help needed turning from your back to your side while in a flat bed without using bedrails?: None Help needed moving from lying on your back to sitting on the side of a flat bed without using bedrails?: None Help needed moving to and from a bed to a chair (including a wheelchair)?: A Little Help needed standing up from a chair using your arms (e.g., wheelchair or bedside chair)?: A Little Help needed to walk in hospital room?: A Little Help needed climbing 3-5 steps with a railing? : A Little 6 Click Score: 20    End of Session Equipment Utilized During Treatment: Gait belt;Back brace;Oxygen Activity Tolerance: Patient tolerated treatment well Patient left: in bed;with call bell/phone within reach;with bed alarm set Nurse Communication: Mobility status PT Visit Diagnosis: Unsteadiness on feet (R26.81);Other abnormalities of gait and mobility (R26.89);Pain Pain - part of body:  (Back)     Time: 1610-9604 PT Time Calculation (min) (ACUTE ONLY): 24 min  Charges:    $Therapeutic Activity: 23-37 mins PT General Charges $$ ACUTE PT VISIT: 1 Visit                     Doreen Beam, SPT   Maty Zeisler 10/31/2023, 1:42 PM

## 2023-10-31 NOTE — Progress Notes (Addendum)
 Pt discharge education and instructions completed with pt and son Feliz Beam at bedside. Both voices understanding and denies any questions. Pt IV and telemetry removed prior to discharge. Pt discharge home with son to transport her her home. Pt TOC prescriptions oxycodone, FeroSul and Robaxin picked up and handed to patient. Pt to pick up other electronically sent prescriptions from preferred pharmacy on file. Pt home DME walker and oxygen delivered to pt at bedside. Pt transported off unit via wheelchair on 3 L oxygen [place], back brace on and aligned; belongings including home DME at side. Dionne Bucy RN

## 2023-10-31 NOTE — Discharge Summary (Signed)
 Patient ID: Denise [name] [id number] 12/12/1960 63 y.o.  Admit date: 10/26/2023 Discharge date: 10/31/2023   Discharge Diagnosis 63 y/o F s/p GLF Traumatic pneumothorax R sided rib fractures 5-7 L4 TP fx, mild sup endplate fx Back hematoma w/ ABL anemia Aortic stenosis, critical stenosis B iliac and femoral arteries Symptomatic carotid artery stenosis RLL airspace disease concerning for aspiration pneumonitis  Chronic COPD Hyponatremia H/O CAD, Aortic atherosclerosis Hx HTN Vit D def  Lung nodule LLL Class I obesity   Consultants NSGY TRH  HPI: Denise [name] is an 63 y.o. female with multiple medical comorbidities including carotid stenosis with baseline blindness of right eye who fell late yesterday evening.   Patient states she felt dizzy and lost balance and fell off her porch from of a height of 2-3 feet onto the grass. She had severe pain to right side of her chest, neck and lower back. No abdominal pain. No numbness or weakness. Patient having moderate amount of coughing from irritation.   Given moderate sized pneumothorax on CXR, elected to place chest tube prior to CT scans. Patient tolerated procedure well and had improvement in SOB and coughing.   Of note, patient had upcoming endarterectomy scheduled with Dr. Chestine Spore on 3/28.  Procedures Dr. Lysle Rubens - 10/26/23 - chest tube insertion  Hospital Course:  63 y/o F s/p GLF  Traumatic pneumothorax-  Chest tube placed 3/22. Serial chest xrays were monitored and once chest output decreased and pneumothorax improved the chest tube was removed. No PTX on f/u CXR 3/24  R sided rib fractures 5-7-  tx w/ multimodality pain management, wean O2 as able  L4 TP fx, mild sup endplate fx- Per NSGY, Dr. Franky Macho. Mobilize in lumbar corest. F/u w/ Dr. Franky Macho  Back hematoma w/ ABL anemia- etiology likely 2/2 above. Tx w/ binder. Serial hgb were monitored and stabilized prior to d/c. Discussed w/ attending -  she can restart ASA at d/c. She should start iron and vit C. Recommend f/u in 1 week w/ PCP for repeat labs.   TRH assistance appreciate w/ history of  Aortic stenosis, critical stenosis B iliac and femoral arteries: restart asa at d/c Symptomatic carotid artery stenosis: was scheduled for CEA 3/28, Dr. Chestine Spore notified of her admission. She reports this surgery has been postponed. ASA/Statin RLL airspace disease concerning for aspiration pneumonitis - TRH recommending observing off abx.  Chronic COPD: restarted home meds. Tx w/ aggressive pulmonary toilet. Required home o2 at d/c. F/u pcp.  Hyponatremia - TRH concerned for SIADH. Recommended continuing 1g salt tabs BID and fluid restriction at d/c. F/u with PCP for repeat labs in 1 week.  H/O CAD, Aortic atherosclerosis: home meds Hx HTN: home meds adjusted by [facility] (appreciate their assistance w/ med rec for this) Vit D def - Tx w/ Vit D Lung nodule LLL: recommended outpatient f/u with pcp to disucss timing of repeat imaging. Discussed findings with patient on day of d/c.  Class I obesity   Pt worked w/ PT/OT and was recommended for [facility] PT/OT. She required home o2 at d/c. This was arranged by TOC. On 3/27, the patient was voiding well, tolerating diet, working well with therapies, pain well controlled, vital signs stable and felt stable for discharge home. Confirmed w/ TRH they were okay w/ d/c from their standpoint as well. Appreciate their assistance w/ med rec for areas they were managing. F/u as noted below. Discussed discharge instructions, restrictions and return/call back precautions. She reports she  will have support from her son and daughter at d/c.   Physical Exam: Gen:  Alert, NAD, pleasant HEENT: Anisocoria (R > L). L pupil reactive. She reports baseline blindness in the R eye and follows with optho for this Card:  Reg Pulm:  CTAB, no W/R/R, effort normal. On o2.  Abd: Soft, ND, NT, +BS Ext:  No LE edema Neuro: Anisocoria noted  as above. Otherwise CN grossly intact. MAE's. F/c. Non-focal.  Psych: A&Ox3  Skin: Lower back bruising extending to the R thigh and abdomen as noted in picture in media tab. No overlying erythema or heat. No drainage. There is a boggy area noted just the sacrum and R buttock   Allergies as of 10/31/2023       Reactions   Codeine Itching        Medication List     STOP taking these medications    ferrous gluconate 324 MG tablet Commonly known as: FERGON   lisinopril 30 MG tablet Commonly known as: ZESTRIL       TAKE these medications    acetaminophen 500 MG tablet Commonly known as: TYLENOL Take 2 tablets (1,000 mg total) by mouth every 8 (eight) hours as needed.   albuterol 108 (90 Base) MCG/ACT inhaler Commonly known as: VENTOLIN HFA INHALE 2 PUFFS INTO THE LUNGS EVERY 4-6 HOURS AS NEEDED FOR TIGHTNESS/WHEEZING What changed: See the new instructions.   amLODipine 10 MG tablet Commonly known as: NORVASC TAKE 1 TABLET BY MOUTH EVERY DAY FOR BLOOD PRESSURE What changed: See the new instructions.   aspirin EC 81 MG tablet Take 162 mg by mouth daily.   atorvastatin 80 MG tablet Commonly known as: Lipitor Take 1 tablet (80 mg total) by mouth daily. for cholesterol.   cyanocobalamin 1000 MCG tablet Commonly known as: VITAMIN B12 Take 1,000 mcg by mouth daily.   diphenhydrAMINE 25 MG tablet Commonly known as: BENADRYL Take 25 mg by mouth every 6 (six) hours as needed for allergies.   escitalopram 20 MG tablet Commonly known as: Lexapro Take 1 tablet (20 mg total) by mouth daily. For anxiety   famotidine 20 MG tablet Commonly known as: PEPCID Take 20 mg by mouth daily as needed for heartburn or indigestion.   ferrous sulfate 325 (65 FE) MG tablet Take 1 tablet (325 mg total) by mouth daily with breakfast.   methocarbamol 500 MG tablet Commonly known as: ROBAXIN Take 1 tablet (500 mg total) by mouth every 8 (eight) hours as needed for muscle spasms.    oxyCODONE 5 MG immediate release tablet Commonly known as: Oxy IR/ROXICODONE Take 1 tablet (5 mg total) by mouth every 6 (six) hours as needed for breakthrough pain.   pyridOXINE 25 MG tablet Commonly known as: VITAMIN B6 Take 25 mg by mouth daily.   sodium chloride 1 g tablet Take 1 tablet (1 g total) by mouth 2 (two) times daily with a meal.   SUMAtriptan 50 MG tablet Commonly known as: Imitrex Take 1 tablet at migraine onset. May repeat in 2 hours if headache persists or recurs.   vitamin C 1000 MG tablet Take 1,000 mg by mouth daily.   Vitamin D (Ergocalciferol) 1.25 MG (50000 UNIT) Caps capsule Commonly known as: DRISDOL Take 1 capsule (50,000 Units total) by mouth every 7 (seven) days. Start taking on: November 02, 2023   vitamin E 180 MG (400 UNITS) capsule Take 800 Units by mouth daily.   Wixela Inhub 250-50 MCG/ACT Aepb Generic drug: fluticasone-salmeterol INHALE 1 PUFF  INTO THE LUNGS IN THE MORNING AND AT BEDTIME.               Durable Medical Equipment  (From admission, onward)           Start     Ordered   10/29/23 1238  For home use only DME 3 n 1  Once        10/29/23 1237   10/29/23 1236  For home use only DME Walker rolling  Once       Question Answer Comment  Walker: With 5 Inch Wheels   Patient needs a walker to treat with the following condition Rib fractures   Patient needs a walker to treat with the following condition Lumbar vertebral fracture (HCC)      10/29/23 1237   10/29/23 0916  For home use only DME oxygen  Once       Question Answer Comment  Length of Need 6 Months   Liters per Minute 6   Oxygen delivery system Gas      10/29/23 0915              Follow-up Information     [facility] Follow up.   Why: [facility] Home physical and occupational therapy Agency will call you to arrange appts Contact information: [address] [place] [address] [phone]         [facility] Follow up.   Why: Home oxygen provider Contact information: [address] [place] [address] [phone]         Doreene Nest, NP. Schedule an appointment as soon as possible for a visit in 1 week(s).   Specialty: Internal Medicine Why: Schedule an appointment for repeat labs and follow up Contact information: [address] [place] [address] [phone]         [facility] Follow up.   Why: We will write for a chest xray in 2 weeks (11/14/23). We will call you with results. Please call our office sooner with any questions or concerns. Contact information: [address] [place] [address] [phone]        Coletta Memos, MD Follow up.   Specialty: Neurosurgery Why: For follow up of your back fracture. Contact information: [address] [place] [address] [phone]         Cephus Shelling, MD Follow up.   Specialty: Vascular Surgery Why: To discuss rescheduling your carotid endarterectomy Contact information: [address] [place] [address] [phone]                 Signed: Leary Roca, [facility] 10/31/2023, 1:03 PM Please see Amion for pager number during day hours 7:00am-4:30pm

## 2023-10-31 NOTE — Discharge Instructions (Signed)
 Continue your fluid restriction of < of fluid and salt tabs. Follow up with your primary care provider in 1 week for repeat labs to check your sodium and hemoglobin.  Please wear your back brace when out of bed, mobilizing.  Please follow up with your primary care provider to help determine the duration you will need home oxygen As we discussed, you had a incidental finding of a lung nodule - please follow up with your primary care to discuss timing of repeat imaging to evaluate this.

## 2023-10-31 NOTE — TOC Transition Note (Signed)
 Transition of Care [facility]) - Discharge Note   Patient Details  Name: Denise [name] MRN: [id number] Date of Birth: 1960/09/05  Transition of Care [facility] CM/SW Contact:  Glennon Mac, RN Phone Number: 10/31/2023, 11:43 AM   Clinical Narrative:    Patient medically stable for discharge home today with family to provide needed assistance.  Recommended DME in room with portable oxygen tanks for transport home. Notified [facility] of discharge home today.   Final next level of care: Home w Home Health Services Barriers to Discharge: Barriers Resolved   Patient Goals and CMS Choice   CMS Medicare.gov Compare Post Acute Care list provided to:: Patient Choice offered to / list presented to : Patient                             Discharge Plan and Services Additional resources added to the After Visit Summary for     Discharge Planning Services: CM Consult Post Acute Care Choice: Home Health          DME Arranged: Bedside commode, Walker rolling, Oxygen DME Agency: [facility] Date DME Agency Contacted: 10/29/23 Time DME Agency Contacted: [phone] Representative spoke with at DME Agency: [facility] HH Arranged: PT, OT [facility] Agency: [facility] Date [facility] Agency Contacted: 10/29/23 Time HH Agency Contacted: 1609 Representative spoke with at [facility] Agency: Amy Hyatt  Social Drivers of Health (SDOH) Interventions SDOH Screenings   Food Insecurity: No Food Insecurity (10/27/2023)  Housing: Patient Declined (10/27/2023)  Transportation Needs: No Transportation Needs (04/15/2023)  Alcohol Screen: Low Risk  (04/15/2023)  Depression (PHQ2-9): Medium Risk (09/18/2023)  Financial Resource Strain: Low Risk  (04/15/2023)  Physical Activity: Insufficiently Active (04/15/2023)  Social Connections: Unknown (04/15/2023)  Stress: Stress Concern Present (04/15/2023)  Tobacco Use: Medium Risk (10/28/2023)     Readmission Risk Interventions     No data to display          Quintella Baton, RN, BSN  Trauma/Neuro ICU Case Manager [phone]

## 2023-11-01 ENCOUNTER — Telehealth: Payer: Self-pay

## 2023-11-01 ENCOUNTER — Encounter (HOSPITAL_COMMUNITY): Payer: Self-pay | Admitting: Physician Assistant

## 2023-11-01 ENCOUNTER — Inpatient Hospital Stay (HOSPITAL_COMMUNITY): Admit: 2023-11-01 | Admitting: Vascular Surgery

## 2023-11-01 DIAGNOSIS — R911 Solitary pulmonary nodule: Secondary | ICD-10-CM

## 2023-11-01 SURGERY — ENDARTERECTOMY, CAROTID
Anesthesia: General | Laterality: Right

## 2023-11-01 NOTE — Telephone Encounter (Signed)
 Dr Elease Hashimoto will call and speak directly with patient regarding results. Referrals placed to Thoracic oncology and TCTS.

## 2023-11-01 NOTE — Telephone Encounter (Signed)
-----   Message from Nurse Dema Severin sent at 11/01/2023  2:54 PM EDT -----  ----- Message ----- From: Vesta Mixer, MD Sent: 10/30/2023   9:31 PM EDT To: Davina Poke, RN; Lars Mage, RN  CAC score is 659 which is 98th percentile for age / sex matched controls  She has moderate - severe plaque in the mid - distal LAD. FFR suggests no significant stenosis in the prox :LAD Mild plaque in the LCX and RCA   There is an enlarging nodule in the left lower lobe   Please refer to [facility] Thor[facility] Oncology Clinic [facility]). Will make a referral to TCTS as this would likely be the first step

## 2023-11-01 NOTE — Transitions of Care (Post Inpatient/ED Visit) (Signed)
 11/01/2023  Name: Denise [name] MRN: [id number] DOB: April 04, 1961  Today's TOC FU Call Status: Today's TOC FU Call Status:: Successful TOC FU Call Completed TOC FU Call Complete Date: 11/01/23 Patient's Name and Date of Birth confirmed.  Transition Care Management Follow-up Telephone Call Date of Discharge: 10/31/23 Discharge Facility: [facility]) Type of Discharge: Inpatient Admission Primary Inpatient Discharge Diagnosis:: fall How have you been since you were released from the hospital?: Better Any questions or concerns?: Yes Patient Questions/Concerns:: hospital doc told her to stop lisinopril, wants to know what to do Patient Questions/Concerns Addressed: Notified Provider of Patient Questions/Concerns  Items Reviewed: Did you receive and understand the discharge instructions provided?: Yes Medications obtained,verified, and reconciled?: Yes (Medications Reviewed) Any new allergies since your discharge?: No Dietary orders reviewed?: Yes Do you have support at home?: No  Medications Reviewed Today: Medications Reviewed Today     Reviewed by Karena Addison, LPN (Licensed Practical Nurse) on 11/01/23 at 0911  Med List Status: <None>   Medication Order Taking? Sig Documenting Provider Last Dose Status Informant  acetaminophen (TYLENOL) 500 MG tablet [id number]  Take 2 tablets (1,000 mg total) by mouth every 8 (eight) hours as needed. Maczis, Elmer Sow, PA-C  Active   albuterol (VENTOLIN HFA) 108 (90 Base) MCG/ACT inhaler [id number] No INHALE 2 PUFFS INTO THE LUNGS EVERY 4-6 HOURS AS NEEDED FOR TIGHTNESS/WHEEZING  Patient taking differently: Inhale 2 puffs into the lungs every 6 (six) hours as needed for wheezing or shortness of breath.   Doreene Nest, NP Past Week Active Self, Child, Pharmacy Records  amLODipine (NORVASC) 10 MG tablet [id number] No TAKE 1 TABLET BY MOUTH EVERY DAY FOR BLOOD PRESSURE  Patient taking differently: Take 10 mg by mouth daily.   Doreene Nest, NP 10/25/2023 Active Self, Child, Pharmacy Records  Ascorbic Acid (VITAMIN C) 1000 MG tablet [id number] No Take 1,000 mg by mouth daily. [provider] 10/25/2023 Active Self, Child, Pharmacy Records  aspirin EC 81 MG tablet [id number] No Take 162 mg by mouth daily. [provider] 10/25/2023 Active Self, Child, Pharmacy Records  atorvastatin (LIPITOR) 80 MG tablet [id number] No Take 1 tablet (80 mg total) by mouth daily. for cholesterol. Doreene Nest, NP 10/25/2023 Active Self, Child, Pharmacy Records  diphenhydrAMINE (BENADRYL) 25 MG tablet [id number] No Take 25 mg by mouth every 6 (six) hours as needed for allergies. [provider] 10/25/2023 Active Self, Child, Pharmacy Records  escitalopram (LEXAPRO) 20 MG tablet [id number] No Take 1 tablet (20 mg total) by mouth daily. For anxiety Doreene Nest, NP 10/25/2023 Active Self, Child, Pharmacy Records  famotidine (PEPCID) 20 MG tablet [id number] No Take 20 mg by mouth daily as needed for heartburn or indigestion. [provider] 10/25/2023 Active Self, Child, Pharmacy Records  ferrous sulfate 325 (65 FE) MG tablet [id number]  Take 1 tablet (325 mg total) by mouth daily with breakfast. Jacinto Halim, PA-C  Active   fluticasone-salmeterol [facility] INHUB) 250-50 MCG/ACT AEPB [id number] No INHALE 1 PUFF INTO THE LUNGS IN THE MORNING AND AT BEDTIME. Doreene Nest, NP 10/25/2023 Active Self, Child, Pharmacy Records  methocarbamol (ROBAXIN) 500 MG tablet [id number]  Take 1 tablet (500 mg total) by mouth every 8 (eight) hours as needed for muscle spasms. Jacinto Halim, PA-C  Active   oxyCODONE (OXY IR/ROXICODONE) 5 MG immediate release tablet [id number]  Take 1 tablet (5 mg total) by mouth every 6 (six) hours as needed for breakthrough pain. Maczis, Elmer Sow,  PA-C  Active   pyridOXINE (VITAMIN B-6) 25 MG tablet [id number] No Take 25 mg by mouth daily. [provider] 10/25/2023 Active Self, Child,  Pharmacy Records  sodium chloride 1 g tablet [id number]  Take 1 tablet (1 g total) by mouth 2 (two) times daily with a meal. Candelaria Stagers T, MD  Active   SUMAtriptan (IMITREX) 50 MG tablet [id number] No Take 1 tablet at migraine onset. May repeat in 2 hours if headache persists or recurs.  Patient not taking: Reported on 10/22/2023   Doreene Nest, NP Not Taking Active Self, Child, Pharmacy Records  vitamin B-12 (CYANOCOBALAMIN) 1000 MCG tablet [id number] No Take 1,000 mcg by mouth daily. [provider] 10/25/2023 Active Self, Child, Pharmacy Records  Vitamin D, Ergocalciferol, (DRISDOL) 1.25 MG (50000 UNIT) CAPS capsule [id number]  Take 1 capsule (50,000 Units total) by mouth every 7 (seven) days. Almon Hercules, MD  Active   vitamin E 180 MG (400 UNITS) capsule [id number] No Take 800 Units by mouth daily. [provider] 10/25/2023 Active Self, Child, Pharmacy Records            Home Care and Equipment/Supplies: Were Home Health Services Ordered?: Yes Name of Home Health Agency:: [facility] Has Agency set up a time to come to your home?: No Any new equipment or medical supplies ordered?: Yes Name of Medical supply agency?: [facility] Were you able to get the equipment/medical supplies?: Yes Do you have any questions related to the use of the equipment/supplies?: No  Functional Questionnaire: Do you need assistance with bathing/showering or dressing?: Yes Do you need assistance with meal preparation?: Yes Do you need assistance with eating?: No Do you have difficulty maintaining continence: No Do you need assistance with getting out of bed/getting out of a chair/moving?: Yes Do you have difficulty managing or taking your medications?: No  Follow up appointments reviewed: PCP Follow-up appointment confirmed?: Yes Date of PCP follow-up appointment?: 11/05/23 Follow-up Provider: [facility] Follow-up appointment confirmed?: No Reason Specialist Follow-Up Not  Confirmed: Patient has Specialist Provider Number and will Call for Appointment Do you need transportation to your follow-up appointment?: No Do you understand care options if your condition(s) worsen?: Yes-patient verbalized understanding    SIGNATURE Karena Addison, LPN [facility] Nurse Health Advisor Direct Dial [phone]

## 2023-11-01 NOTE — Telephone Encounter (Signed)
 Patient has hospital follow up scheduled for next week. Will address then.

## 2023-11-04 ENCOUNTER — Telehealth: Payer: Self-pay | Admitting: Primary Care

## 2023-11-04 NOTE — Telephone Encounter (Signed)
 Copied from CRM [id number]. Topic: Clinical - Home Health Verbal Orders >> Nov 04, 2023 11:39 AM Mayer Masker wrote: Caller/Agency: Irving Burton PT / [facility]  Callback Number: [phone] Service Requested: Physical Therapy Frequency: twice a week for 2 weeks then once a week for 2 weeks  Any new concerns about the patient? No   Patient declined OT and Therapist feels she does not need it

## 2023-11-04 NOTE — Telephone Encounter (Signed)
 Approved.

## 2023-11-04 NOTE — Telephone Encounter (Signed)
 Called and advised Denise [name] with [facility] of the approval of the requested verbal orders for this patient. Advised to call back with any further questions.

## 2023-11-05 ENCOUNTER — Ambulatory Visit (INDEPENDENT_AMBULATORY_CARE_PROVIDER_SITE_OTHER): Admitting: Internal Medicine

## 2023-11-05 ENCOUNTER — Encounter: Payer: Self-pay | Admitting: Internal Medicine

## 2023-11-05 VITALS — BP 136/76 | HR 101 | Temp 98.3°F | Ht 64.0 in | Wt 176.0 lb

## 2023-11-05 DIAGNOSIS — S22009D Unspecified fracture of unspecified thoracic vertebra, subsequent encounter for fracture with routine healing: Secondary | ICD-10-CM | POA: Diagnosis not present

## 2023-11-05 DIAGNOSIS — S2241XA Multiple fractures of ribs, right side, initial encounter for closed fracture: Secondary | ICD-10-CM | POA: Diagnosis not present

## 2023-11-05 DIAGNOSIS — J449 Chronic obstructive pulmonary disease, unspecified: Secondary | ICD-10-CM

## 2023-11-05 DIAGNOSIS — R911 Solitary pulmonary nodule: Secondary | ICD-10-CM

## 2023-11-05 DIAGNOSIS — E222 Syndrome of inappropriate secretion of antidiuretic hormone: Secondary | ICD-10-CM | POA: Diagnosis not present

## 2023-11-05 DIAGNOSIS — J432 Centrilobular emphysema: Secondary | ICD-10-CM

## 2023-11-05 HISTORY — DX: Solitary pulmonary nodule: R91.1

## 2023-11-05 HISTORY — DX: Centrilobular emphysema: J43.2

## 2023-11-05 LAB — CBC
HCT: 33.3 % — ABNORMAL LOW (ref 36.0–46.0)
Hemoglobin: 10.7 g/dL — ABNORMAL LOW (ref 12.0–15.0)
MCHC: 32.1 g/dL (ref 30.0–36.0)
MCV: 103.3 fl — ABNORMAL HIGH (ref 78.0–100.0)
Platelets: 630 10*3/uL — ABNORMAL HIGH (ref 150.0–400.0)
RBC: 3.22 Mil/uL — ABNORMAL LOW (ref 3.87–5.11)
RDW: 17.3 % — ABNORMAL HIGH (ref 11.5–15.5)
WBC: 12.4 10*3/uL — ABNORMAL HIGH (ref 4.0–10.5)

## 2023-11-05 LAB — RENAL FUNCTION PANEL
Albumin: 3.7 g/dL (ref 3.5–5.2)
BUN: 6 mg/dL (ref 6–23)
CO2: 27 meq/L (ref 19–32)
Calcium: 9.2 mg/dL (ref 8.4–10.5)
Chloride: 97 meq/L (ref 96–112)
Creatinine, Ser: 0.5 mg/dL (ref 0.40–1.20)
GFR: 100.66 mL/min (ref >60.00)
Glucose, Bld: 96 mg/dL (ref 70–99)
Phosphorus: 3.4 mg/dL (ref 2.3–4.6)
Potassium: 3.7 meq/L (ref 3.5–5.1)
Sodium: 133 meq/L — ABNORMAL LOW (ref 135–145)

## 2023-11-05 MED ORDER — OXYCODONE HCL 5 MG PO TABS: 5.0000 mg | ORAL_TABLET | Freq: Four times a day (QID) | ORAL | 0 refills | Status: DC | PRN

## 2023-11-05 NOTE — Assessment & Plan Note (Signed)
 Slow improvement Will refill the oxycodone

## 2023-11-05 NOTE — Assessment & Plan Note (Addendum)
 Will refill the oxycodone Using the brace to help keep from twisting, etc Does have follow up with Dr Franky Macho

## 2023-11-05 NOTE — Assessment & Plan Note (Signed)
 Not exacerbated despite the rib fractures, etc Continue usual treatments

## 2023-11-05 NOTE — Progress Notes (Signed)
 Subjective:    Patient ID: Denise [name], female    DOB: 1961-02-08, 63 y.o.   MRN: [id number]  HPI Here for hospital follow up With son  Larey Seat 3/21 and admitted 3/22 early AM Fell down about 3 feet off porch (missed step due to vision problems) Landed right on right side Right rib fractures 5-7 with traumatic pneumothorax L4 fracture as well--and hematoma on back (with secondary anemia) Hemoglobin did drop to 8.9 Chest tube placed on right and finally removed after stability of drainage after 2 days Right lung opacification (RLL)--concern for aspiration (but no known aspiration) No fever though--just chronic COPD Put in lumbar corset for the fracture  Low sodium noted--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 Nadir 120 (has been in the 130's at times prior to this) Put on salt tablets and fluid restriction (hard to do this) Sent home 3/27--referred for home health PT/OT (did start yesterday)  Having some right hip pain--wonders if it is the hematoma moving (discussed that it can settle by gravity)  Trouble sleeping --using the oxycodone and methocarbamol Hard getting comfortable Splints right side with pillow for cough Breathing has improved--able to go without oxygen today (lowest sat was 88%)  Current Outpatient Medications on File Prior to Visit  Medication Sig Dispense Refill   acetaminophen (TYLENOL) 500 MG tablet Take 2 tablets (1,000 mg total) by mouth every 8 (eight) hours as needed.     albuterol (VENTOLIN HFA) 108 (90 Base) MCG/ACT inhaler INHALE 2 PUFFS INTO THE LUNGS EVERY 4-6 HOURS AS NEEDED FOR TIGHTNESS/WHEEZING (Patient taking differently: Inhale 2 puffs into the lungs every 6 (six) hours as needed for wheezing or shortness of breath.) 8.5 each 0   amLODipine (NORVASC) 10 MG tablet TAKE 1 TABLET BY MOUTH EVERY DAY FOR BLOOD PRESSURE (Patient taking differently: Take 10 mg by mouth daily.) 90 tablet 2   Ascorbic Acid (VITAMIN C) 1000 MG tablet Take 1,000 mg by mouth daily.      aspirin EC 81 MG tablet Take 162 mg by mouth daily.     atorvastatin (LIPITOR) 80 MG tablet Take 1 tablet (80 mg total) by mouth daily. for cholesterol. 90 tablet 3   diphenhydrAMINE (BENADRYL) 25 MG tablet Take 25 mg by mouth every 6 (six) hours as needed for allergies.     escitalopram (LEXAPRO) 20 MG tablet Take 1 tablet (20 mg total) by mouth daily. For anxiety 90 tablet 0   famotidine (PEPCID) 20 MG tablet Take 20 mg by mouth daily as needed for heartburn or indigestion.     ferrous sulfate 325 (65 FE) MG tablet Take 1 tablet (325 mg total) by mouth daily with breakfast. 60 tablet 0   fluticasone-salmeterol (WIXELA INHUB) 250-50 MCG/ACT AEPB INHALE 1 PUFF INTO THE LUNGS IN THE MORNING AND AT BEDTIME. 180 each 1   methocarbamol (ROBAXIN) 500 MG tablet Take 1 tablet (500 mg total) by mouth every 8 (eight) hours as needed for muscle spasms. 60 tablet 0   oxyCODONE (OXY IR/ROXICODONE) 5 MG immediate release tablet Take 1 tablet (5 mg total) by mouth every 6 (six) hours as needed for breakthrough pain. 15 tablet 0   pyridOXINE (VITAMIN B-6) 25 MG tablet Take 25 mg by mouth daily.     sodium chloride 1 g tablet Take 1 tablet (1 g total) by mouth 2 (two) times daily with a meal. 60 tablet 0   vitamin B-12 (CYANOCOBALAMIN) 1000 MCG tablet Take 1,000 mcg by mouth daily.     Vitamin D, Ergocalciferol, (  DRISDOL) 1.25 MG (50000 UNIT) CAPS capsule Take 1 capsule (50,000 Units total) by mouth every 7 (seven) days. 7 capsule 0   vitamin E 180 MG (400 UNITS) capsule Take 800 Units by mouth daily.     No current facility-administered medications on file prior to visit.    Allergies  Allergen Reactions   Codeine Itching    Past Medical History:  Diagnosis Date   Allergy    Anemia 12/24/2019   slow Colon bleed   Anxiety    Arthritis    BMI 35.0-35.9,adult 03/31/2012   Chickenpox    Cholecystitis with cholelithiasis 03/31/2012   COPD (chronic obstructive pulmonary disease) (HCC)    Coronary artery  disease    Nonobstructive   Depression    GERD (gastroesophageal reflux disease)    Headache    Heart murmur    Hypertension 03/31/2012   Peripheral vascular disease (HCC)    Right Carotid Artery Stenosis   Pneumonia    Shingles     Past Surgical History:  Procedure Laterality Date   BIOPSY  12/24/2019   Procedure: BIOPSY;  Surgeon: Rachael Fee, MD;  Location: [facility] ENDOSCOPY;  Service: Endoscopy;;   CATARACT EXTRACTION Bilateral 2021   CHOLECYSTECTOMY  03/30/2012   Procedure: LAPAROSCOPIC CHOLECYSTECTOMY WITH INTRAOPERATIVE CHOLANGIOGRAM;  Surgeon: Mariella Saa, MD;  Location: [facility] ORS;  Service: General;  Laterality: N/A;   COLONOSCOPY WITH PROPOFOL N/A 12/24/2019   Procedure: COLONOSCOPY WITH PROPOFOL;  Surgeon: Rachael Fee, MD;  Location: [facility] ENDOSCOPY;  Service: Endoscopy;  Laterality: N/A;   ESOPHAGOGASTRODUODENOSCOPY (EGD) WITH PROPOFOL N/A 12/24/2019   Procedure: ESOPHAGOGASTRODUODENOSCOPY (EGD) WITH PROPOFOL;  Surgeon: Rachael Fee, MD;  Location: [facility] ENDOSCOPY;  Service: Endoscopy;  Laterality: N/A;   HOT HEMOSTASIS N/A 12/24/2019   Procedure: HOT HEMOSTASIS (ARGON PLASMA COAGULATION/BICAP);  Surgeon: Rachael Fee, MD;  Location: [facility] ENDOSCOPY;  Service: Endoscopy;  Laterality: N/A;   POLYPECTOMY  12/24/2019   Procedure: POLYPECTOMY;  Surgeon: Rachael Fee, MD;  Location: [facility] ENDOSCOPY;  Service: Endoscopy;;   TUBAL LIGATION  1997    Family History  Problem Relation Age of Onset   Arthritis Mother    Cervical cancer Mother    Heart disease Father    Hypertension Father     Social History   Socioeconomic History   Marital status: Widowed    Spouse name: Not on file   Number of children: 2   Years of education: Not on file   Highest education level: Associate degree: occupational, Scientist, product/process development, or vocational program  Occupational History   Not on file  Tobacco Use   Smoking status: Former    Current packs/day: 0.00    Average packs/day: 0.8  packs/day for 38.0 years (28.5 ttl pk-yrs)    Types: Cigarettes    Start date: 05/06/1985    Quit date: 05/07/2023    Years since quitting: 0.4   Smokeless tobacco: Never  Vaping Use   Vaping status: Never Used  Substance and Sexual Activity   Alcohol use: Yes    Comment: occasionally   Drug use: No   Sexual activity: Never    Birth control/protection: None, Post-menopausal  Other Topics Concern   Not on file  Social History Narrative   Widow.    2 children.   Works in Clinical biochemist.   Enjoys reading, cooking.    Social Drivers of Health   Financial Resource Strain: Low Risk  (04/15/2023)   Overall Financial Resource Strain (CARDIA)  Difficulty of Paying Living Expenses: Not very hard  Food Insecurity: No Food Insecurity (10/27/2023)   Hunger Vital Sign    Worried About Running Out of Food in the Last Year: Never true    Ran Out of Food in the Last Year: Never true  Transportation Needs: No Transportation Needs (04/15/2023)   PRAPARE - Administrator, Civil Service (Medical): No    Lack of Transportation (Non-Medical): No  Physical Activity: Insufficiently Active (04/15/2023)   Exercise Vital Sign    Days of Exercise per Week: 3 days    Minutes of Exercise per Session: 30 min  Stress: Stress Concern Present (04/15/2023)   [name] of Occupational Health - Occupational Stress Questionnaire    Feeling of Stress : Very much  Social Connections: Unknown (04/15/2023)   Social Connection and Isolation Panel [NHANES]    Frequency of Communication with Friends and Family: Three times a week    Frequency of Social Gatherings with Friends and Family: Patient declined    Attends Religious Services: Patient declined    Active Member of Clubs or Organizations: No    Attends Engineer, structural: Not on file    Marital Status: Widowed  Intimate Partner Violence: Not on file   Review of Systems Chronic vision loss Known vascular disease--awaiting a carotid  endarterectomy Some edema when she was trying to be up more (sitting). Better once she elevated her legs    Objective:   Physical Exam Constitutional:      Appearance: Normal appearance.  Cardiovascular:     Rate and Rhythm: Regular rhythm. Tachycardia present.     Heart sounds:     No gallop.     Comments: Grade 3/6 systolic murmur Rate just above 100 Pulmonary:     Effort: Pulmonary effort is normal.     Breath sounds: No rales.     Comments: Decreased breath sounds RLL Slight wheezes on left but not tight Musculoskeletal:     Cervical back: Neck supple.     Right lower leg: No edema.     Left lower leg: No edema.  Lymphadenopathy:     Cervical: No cervical adenopathy.  Skin:    Comments: Extensive ecchymoses across both sides of back  Small hematoma at top of right buttock--not inflamed  Neurological:     Mental Status: She is alert.            Assessment & Plan:

## 2023-11-05 NOTE — Assessment & Plan Note (Signed)
 Likely related to the trauma, lung, etc On salt bid and trying to restrict fluids If still low 120's--would add furosemide

## 2023-11-06 ENCOUNTER — Telehealth: Payer: Self-pay

## 2023-11-06 ENCOUNTER — Other Ambulatory Visit: Payer: Self-pay | Admitting: *Deleted

## 2023-11-06 ENCOUNTER — Encounter: Payer: Self-pay | Admitting: Internal Medicine

## 2023-11-06 DIAGNOSIS — R911 Solitary pulmonary nodule: Secondary | ICD-10-CM

## 2023-11-06 NOTE — Telephone Encounter (Signed)
 Copied from CRM [id number]. Topic: Clinical - Medication Question >> Nov 06, 2023  9:59 AM Sonny Dandy B wrote: Reason for CRM:  Megan Nurse navigator called from [facility] called to request a order from the provider for Pulmonology consults.  Please put order in my chart as soon as possible. Please call [phone] [facility]

## 2023-11-06 NOTE — Telephone Encounter (Signed)
 Called and spoke with Aundra Millet, she stated they were aware of the previous referral placed and their scheduling department has talked with the patient, she declined oncology involvement at this time, but is agreeable to seeing pulmonology for the incidental finding of left lower lobe nodule.

## 2023-11-06 NOTE — Telephone Encounter (Signed)
 Copied from CRM [id number]. Topic: General - Other >> Nov 06, 2023 12:49 PM Truddie Crumble wrote: Reason for CRM: Will from [facility] called stating the patient is refusing the occupational therapy service that was ordered  CB [phone]

## 2023-11-06 NOTE — Addendum Note (Signed)
 Addended by: Doreene Nest on: 11/06/2023 02:48 PM   Modules accepted: Orders

## 2023-11-06 NOTE — Telephone Encounter (Signed)
Noted, new referral placed.  

## 2023-11-06 NOTE — Telephone Encounter (Signed)
 Noted.

## 2023-11-06 NOTE — Progress Notes (Signed)
 Denise [name], Denise Ping, MD to Cv Div [facility] Triage  Ludwig Clarks, RN    Please place urgent referral to pulmonary medicine  Also please schedule pulmonary function tests with and without bronchodilators

## 2023-11-06 NOTE — Telephone Encounter (Signed)
 Please notify Aundra Millet that I referred her to pulmonology in October 2024, patient never returned their phone calls. What is the referral for? COPD?

## 2023-11-07 ENCOUNTER — Telehealth: Payer: Self-pay

## 2023-11-07 NOTE — Telephone Encounter (Signed)
 Noted. Last time she was in office it was documented at 101. If patient is having no symptoms or distress. Nothing further needed.

## 2023-11-07 NOTE — Telephone Encounter (Signed)
 Left message with Loraine Leriche with [facility] notified.

## 2023-11-07 NOTE — Telephone Encounter (Signed)
 Copied from CRM [id number]. Topic: Clinical - Medical Advice >> Nov 07, 2023 10:31 AM Presley Raddle C wrote: Reason for CRM: mark with [facility] called to report the patient's heartrate reading at 102, resting. CB# [phone]. Please advise.

## 2023-11-08 ENCOUNTER — Telehealth: Payer: Self-pay

## 2023-11-08 NOTE — Telephone Encounter (Signed)
 Copied from CRM [id number]. Topic: Clinical - Home Health Verbal Orders >> Nov 08, 2023  2:23 PM Drema Balzarine wrote: Caller/Agency: Irving Burton from [facility]  Callback Number: [phone] Service Requested: Physical Therapy Frequency:  Any new concerns about the patient? Yes, Patient wants to discontinue PT, she doesn't want to pay any copayment's and she says she's doing okay

## 2023-11-08 NOTE — Telephone Encounter (Signed)
 Noted. Okay to discontinue PT.

## 2023-11-08 NOTE — Telephone Encounter (Signed)
 Verbal orders given to [facility] to D/C PT

## 2023-11-17 ENCOUNTER — Other Ambulatory Visit: Payer: Self-pay | Admitting: Primary Care

## 2023-11-17 DIAGNOSIS — F32A Depression, unspecified: Secondary | ICD-10-CM

## 2023-11-17 DIAGNOSIS — F419 Anxiety disorder, unspecified: Secondary | ICD-10-CM

## 2023-11-19 ENCOUNTER — Inpatient Hospital Stay: Admitting: Nurse Practitioner

## 2023-11-19 ENCOUNTER — Telehealth: Payer: Self-pay

## 2023-11-19 NOTE — Telephone Encounter (Signed)
 Attempted to call for surgery scheduling. LVM

## 2023-11-20 ENCOUNTER — Ambulatory Visit
Admission: RE | Admit: 2023-11-20 | Discharge: 2023-11-20 | Disposition: A | Discharge status: Home / Self Care | Source: Ambulatory Visit | Attending: Physician Assistant | Admitting: Physician Assistant

## 2023-11-20 DIAGNOSIS — Z8709 Personal history of other diseases of the respiratory system: Secondary | ICD-10-CM

## 2023-11-21 ENCOUNTER — Encounter: Payer: Self-pay | Admitting: Primary Care

## 2023-11-21 ENCOUNTER — Ambulatory Visit (INDEPENDENT_AMBULATORY_CARE_PROVIDER_SITE_OTHER): Admitting: Primary Care

## 2023-11-21 VITALS — BP 148/82 | HR 78 | Temp 100.2°F | Ht 64.0 in | Wt 177.0 lb

## 2023-11-21 DIAGNOSIS — E222 Syndrome of inappropriate secretion of antidiuretic hormone: Secondary | ICD-10-CM

## 2023-11-21 DIAGNOSIS — L539 Erythematous condition, unspecified: Secondary | ICD-10-CM | POA: Diagnosis not present

## 2023-11-21 DIAGNOSIS — E871 Hypo-osmolality and hyponatremia: Secondary | ICD-10-CM

## 2023-11-21 DIAGNOSIS — R600 Localized edema: Secondary | ICD-10-CM

## 2023-11-21 DIAGNOSIS — S2241XA Multiple fractures of ribs, right side, initial encounter for closed fracture: Secondary | ICD-10-CM | POA: Diagnosis not present

## 2023-11-21 LAB — CBC WITH DIFFERENTIAL/PLATELET
Basophils Absolute: 0.1 10*3/uL (ref 0.0–0.1)
Basophils Relative: 1.2 % (ref 0.0–3.0)
Eosinophils Absolute: 0.3 10*3/uL (ref 0.0–0.7)
Eosinophils Relative: 2.9 % (ref 0.0–5.0)
HCT: 42.8 % (ref 36.0–46.0)
Hemoglobin: 13.5 g/dL (ref 12.0–15.0)
Lymphocytes Relative: 11.3 % — ABNORMAL LOW (ref 12.0–46.0)
Lymphs Abs: 1 10*3/uL (ref 0.7–4.0)
MCHC: 31.6 g/dL (ref 30.0–36.0)
MCV: 99.8 fl (ref 78.0–100.0)
Monocytes Absolute: 0.6 10*3/uL (ref 0.1–1.0)
Monocytes Relative: 7.2 % (ref 3.0–12.0)
Neutro Abs: 6.9 10*3/uL (ref 1.4–7.7)
Neutrophils Relative %: 77.4 % — ABNORMAL HIGH (ref 43.0–77.0)
Platelets: 383 10*3/uL (ref 150.0–400.0)
RBC: 4.29 Mil/uL (ref 3.87–5.11)
RDW: 17.3 % — ABNORMAL HIGH (ref 11.5–15.5)
WBC: 8.9 10*3/uL (ref 4.0–10.5)

## 2023-11-21 LAB — BASIC METABOLIC PANEL WITH GFR
BUN: 6 mg/dL (ref 6–23)
CO2: 27 meq/L (ref 19–32)
Calcium: 8.9 mg/dL (ref 8.4–10.5)
Chloride: 100 meq/L (ref 96–112)
Creatinine, Ser: 0.54 mg/dL (ref 0.40–1.20)
GFR: 98.78 mL/min (ref >60.00)
Glucose, Bld: 103 mg/dL — ABNORMAL HIGH (ref 70–99)
Potassium: 3.6 meq/L (ref 3.5–5.1)
Sodium: 134 meq/L — ABNORMAL LOW (ref 135–145)

## 2023-11-21 MED ORDER — TRAMADOL HCL 50 MG PO TABS
50.0000 mg | ORAL_TABLET | Freq: Two times a day (BID) | ORAL | 0 refills | Status: DC | PRN
Start: 2023-11-21 — End: 2023-11-29

## 2023-11-21 MED ORDER — DOXYCYCLINE HYCLATE 100 MG PO TABS: 100.0000 mg | ORAL_TABLET | Freq: Two times a day (BID) | ORAL | 0 refills | Status: DC

## 2023-11-21 MED ORDER — FUROSEMIDE 20 MG PO TABS: ORAL_TABLET | ORAL | 0 refills | Status: DC

## 2023-11-21 NOTE — Assessment & Plan Note (Signed)
 Chest x-ray pending radiology read.  Start tramadol 50 mg twice daily as needed.

## 2023-11-21 NOTE — Assessment & Plan Note (Addendum)
 With recent hospitalization.  Repeat sodium levels pending. Start furosemide 20 mg daily x 5 days for lower extremity edema.  Close follow-up next week.

## 2023-11-21 NOTE — Progress Notes (Signed)
 Subjective:    Patient ID: Denise [name], female    DOB: 1961/01/13, 63 y.o.   MRN: [id number]  Fall    Denise [name] is a very pleasant 63 y.o. female with a history of CAD, AVM of colon, carotid artery disease, COPD, SIADH, hyperlipidemia, iron deficiency anemia, who presents today to discuss lower extremity pain.  Her daughter joins [place] today.  Evaluated by Dr. Alphonsus Sias on 11/05/2023 for hospital follow-up.  Hospitalized for right rib fractures with traumatic pneumothorax and L4 fracture after a fall.  She fell as she missed several steps due to her ongoing vision problems.  During her hospital stay she was diagnosed with SIADH and placed on salt tablets and fluid restriction.  Sodium levels were rechecked on 11/05/2023 with level of 133.  Continued consumption of salt tablets was recommended.  She presents today with concerns of new erythema that began about 1-2 weeks ago.  The erythema is located to the right lower portion of the lower extremity from the mid calf down through her toes.  She is also noticed warmth to the right lower extremity at the site of erythema.   She continues with bilateral lower extremities.  She is trying to eat the salt tablets but has difficulty due to her dry mouth. She's also having a hard time limiting liquids due to her dry mouth.  She continues to experience right lateral chest wall pain from her rib fractures.  She has not taken oxycodone in 6 days, is questioning what she can take for pain now.  She underwent an x-ray of her chest yesterday and is waiting on results.  She is also needing FMLA paperwork completed for missed days of work beginning April 21, 2023.  She is currently on long-term disability.   Review of Systems  Respiratory:  Positive for cough.   Cardiovascular:  Positive for leg swelling.  Musculoskeletal:  Positive for arthralgias.  Skin:  Positive for color change.         Past Medical History:  Diagnosis Date   Allergy     Anemia 12/24/2019   slow Colon bleed   Anxiety    Arthritis    BMI 35.0-35.9,adult 03/31/2012   Chickenpox    Cholecystitis with cholelithiasis 03/31/2012   COPD (chronic obstructive pulmonary disease) (HCC)    Coronary artery disease    Nonobstructive   Depression    GERD (gastroesophageal reflux disease)    Headache    Heart murmur    High anion gap metabolic acidosis 10/26/2023   Hypertension 03/31/2012   Peripheral vascular disease (HCC)    Right Carotid Artery Stenosis   Pneumonia    Shingles     Social History   Socioeconomic History   Marital status: Widowed    Spouse name: Not on file   Number of children: 2   Years of education: Not on file   Highest education level: Associate degree: occupational, Scientist, product/process development, or vocational program  Occupational History   Not on file  Tobacco Use   Smoking status: Former    Current packs/day: 0.00    Average packs/day: 0.8 packs/day for 38.0 years (28.5 ttl pk-yrs)    Types: Cigarettes    Start date: 05/06/1985    Quit date: 05/07/2023    Years since quitting: 0.5   Smokeless tobacco: Never  Vaping Use   Vaping status: Never Used  Substance and Sexual Activity   Alcohol use: Yes    Comment: occasionally   Drug use: No  Sexual activity: Never    Birth control/protection: None, Post-menopausal  Other Topics Concern   Not on file  Social History Narrative   Widow.    2 children.   Works in Clinical biochemist.   Enjoys reading, cooking.    Social Drivers of Corporate investment banker Strain: Low Risk  (04/15/2023)   Overall Financial Resource Strain (CARDIA)    Difficulty of Paying Living Expenses: Not very hard  Food Insecurity: No Food Insecurity (10/27/2023)   Hunger Vital Sign    Worried About Running Out of Food in the Last Year: Never true    Ran Out of Food in the Last Year: Never true  Transportation Needs: No Transportation Needs (04/15/2023)   PRAPARE - Administrator, Civil Service (Medical):  No    Lack of Transportation (Non-Medical): No  Physical Activity: Insufficiently Active (04/15/2023)   Exercise Vital Sign    Days of Exercise per Week: 3 days    Minutes of Exercise per Session: 30 min  Stress: Stress Concern Present (04/15/2023)   [name] of Occupational Health - Occupational Stress Questionnaire    Feeling of Stress : Very much  Social Connections: Unknown (04/15/2023)   Social Connection and Isolation Panel [NHANES]    Frequency of Communication with Friends and Family: Three times a week    Frequency of Social Gatherings with Friends and Family: Patient declined    Attends Religious Services: Patient declined    Active Member of Clubs or Organizations: No    Attends Engineer, structural: Not on file    Marital Status: Widowed  Intimate Partner Violence: Not on file    Past Surgical History:  Procedure Laterality Date   BIOPSY  12/24/2019   Procedure: BIOPSY;  Surgeon: Janel Medford, MD;  Location: [facility] ENDOSCOPY;  Service: Endoscopy;;   CATARACT EXTRACTION Bilateral 2021   CHOLECYSTECTOMY  03/30/2012   Procedure: LAPAROSCOPIC CHOLECYSTECTOMY WITH INTRAOPERATIVE CHOLANGIOGRAM;  Surgeon: Quitman Bucy, MD;  Location: [facility] ORS;  Service: General;  Laterality: N/A;   COLONOSCOPY WITH PROPOFOL N/A 12/24/2019   Procedure: COLONOSCOPY WITH PROPOFOL;  Surgeon: Janel Medford, MD;  Location: [facility] ENDOSCOPY;  Service: Endoscopy;  Laterality: N/A;   ESOPHAGOGASTRODUODENOSCOPY (EGD) WITH PROPOFOL N/A 12/24/2019   Procedure: ESOPHAGOGASTRODUODENOSCOPY (EGD) WITH PROPOFOL;  Surgeon: Janel Medford, MD;  Location: [facility] ENDOSCOPY;  Service: Endoscopy;  Laterality: N/A;   HOT HEMOSTASIS N/A 12/24/2019   Procedure: HOT HEMOSTASIS (ARGON PLASMA COAGULATION/BICAP);  Surgeon: Janel Medford, MD;  Location: [facility] ENDOSCOPY;  Service: Endoscopy;  Laterality: N/A;   POLYPECTOMY  12/24/2019   Procedure: POLYPECTOMY;  Surgeon: Janel Medford, MD;  Location: [facility]  ENDOSCOPY;  Service: Endoscopy;;   TUBAL LIGATION  1997    Family History  Problem Relation Age of Onset   Arthritis Mother    Cervical cancer Mother    Heart disease Father    Hypertension Father     Allergies  Allergen Reactions   Codeine Itching    Current Outpatient Medications on File Prior to Visit  Medication Sig Dispense Refill   acetaminophen (TYLENOL) 500 MG tablet Take 2 tablets (1,000 mg total) by mouth every 8 (eight) hours as needed.     albuterol (VENTOLIN HFA) 108 (90 Base) MCG/ACT inhaler INHALE 2 PUFFS INTO THE LUNGS EVERY 4-6 HOURS AS NEEDED FOR TIGHTNESS/WHEEZING (Patient taking differently: Inhale 2 puffs into the lungs every 6 (six) hours as needed for wheezing or shortness of breath.) 8.5 each 0  amLODipine (NORVASC) 10 MG tablet TAKE 1 TABLET BY MOUTH EVERY DAY FOR BLOOD PRESSURE (Patient taking differently: Take 10 mg by mouth daily.) 90 tablet 2   Ascorbic Acid (VITAMIN C) 1000 MG tablet Take 1,000 mg by mouth daily.     aspirin EC 81 MG tablet Take 162 mg by mouth daily.     atorvastatin (LIPITOR) 80 MG tablet Take 1 tablet (80 mg total) by mouth daily. for cholesterol. 90 tablet 3   diphenhydrAMINE (BENADRYL) 25 MG tablet Take 25 mg by mouth every 6 (six) hours as needed for allergies.     escitalopram (LEXAPRO) 20 MG tablet Take 1 tablet (20 mg total) by mouth daily. For anxiety 90 tablet 0   famotidine (PEPCID) 20 MG tablet Take 20 mg by mouth daily as needed for heartburn or indigestion.     ferrous sulfate 325 (65 FE) MG tablet Take 1 tablet (325 mg total) by mouth daily with breakfast. 60 tablet 0   fluticasone-salmeterol (WIXELA INHUB) 250-50 MCG/ACT AEPB INHALE 1 PUFF INTO THE LUNGS IN THE MORNING AND AT BEDTIME. 180 each 1   methocarbamol (ROBAXIN) 500 MG tablet Take 1 tablet (500 mg total) by mouth every 8 (eight) hours as needed for muscle spasms. 60 tablet 0   pyridOXINE (VITAMIN B-6) 25 MG tablet Take 25 mg by mouth daily.     sodium chloride 1  g tablet Take 1 tablet (1 g total) by mouth 2 (two) times daily with a meal. 60 tablet 0   vitamin B-12 (CYANOCOBALAMIN) 1000 MCG tablet Take 1,000 mcg by mouth daily.     Vitamin D, Ergocalciferol, (DRISDOL) 1.25 MG (50000 UNIT) CAPS capsule Take 1 capsule (50,000 Units total) by mouth every 7 (seven) days. 7 capsule 0   vitamin E 180 MG (400 UNITS) capsule Take 800 Units by mouth daily.     No current facility-administered medications on file prior to visit.    BP (!) 148/82   Pulse 78   Temp 100.2 F (37.9 C) (Temporal)   Ht 5\' 4"  (1.626 m)   Wt 177 lb (80.3 kg)   SpO2 94%   BMI 30.38 kg/m  Objective:   Physical Exam Cardiovascular:     Rate and Rhythm: Normal rate and regular rhythm.     Comments: Bilateral lower extremity edema from knees down to toes.  2+ pitting on right, 1+ pitting on left. Pulmonary:     Effort: Pulmonary effort is normal.     Breath sounds: Normal breath sounds.  Musculoskeletal:     Cervical back: Neck supple.     Right lower leg: 2+ Edema present.     Left lower leg: 1+ Edema present.  Skin:    General: Skin is warm and dry.     Findings: Erythema present.     Comments: Large hematoma to mid lower lumbar region.  Moderate erythema from lower right calf down through toes with moderate edema.  Warmth.    Neurological:     Mental Status: She is alert and oriented to person, place, and time.  Psychiatric:     Comments: Appears anxious           Assessment & Plan:  Erythema of lower extremity Assessment & Plan: Exam today suspicious for cellulitis, especially in the setting of a low-grade fever and her overall presentation today.  Lower suspicion for DVT but will keep on differential list.  Start doxycycline 100 mg twice daily x 7 days. Close follow-up next week.  Orders: -     CBC with Differential/Platelet -     Doxycycline Hyclate; Take 1 tablet (100 mg total) by mouth 2 (two) times daily.  Dispense: 14 tablet; Refill:  0  Bilateral lower extremity edema Assessment & Plan: Likely from SIADH and hyponatremia.  Start furosemide 20 mg daily x 5 days. Repeat sodium levels pending.  Orders: -     Furosemide; Take 1 tablet by mouth once daily for 5 days for leg swelling.  Dispense: 5 tablet; Refill: 0  SIADH (syndrome of inappropriate ADH production) (HCC) Assessment & Plan: With recent hospitalization.  Repeat sodium levels pending. Start furosemide 20 mg daily x 5 days for lower extremity edema.  Close follow-up next week.  Orders: -     Furosemide; Take 1 tablet by mouth once daily for 5 days for leg swelling.  Dispense: 5 tablet; Refill: 0 -     Basic metabolic panel with GFR  Multiple fractures of ribs, right side, initial encounter for closed fracture Assessment & Plan: Chest x-ray pending radiology read.  Start tramadol 50 mg twice daily as needed.  Orders: -     traMADol HCl; Take 1 tablet (50 mg total) by mouth every 12 (twelve) hours as needed for severe pain (pain score 7-10).  Dispense: 14 tablet; Refill: 0  Hyponatremia Assessment & Plan: Repeat sodium levels pending.  Start furosemide 20 mg daily x 5 days. Close follow-up next week.         Taelar Gronewold K Jacobi Nile, NP

## 2023-11-21 NOTE — Assessment & Plan Note (Signed)
 Repeat sodium levels pending.  Start furosemide 20 mg daily x 5 days. Close follow-up next week.

## 2023-11-21 NOTE — Assessment & Plan Note (Signed)
 Likely from SIADH and hyponatremia.  Start furosemide 20 mg daily x 5 days. Repeat sodium levels pending.

## 2023-11-21 NOTE — Patient Instructions (Addendum)
 Start furosemide 20 mg tablets.  Take 1 tablet by mouth every day for leg swelling x 5 days.  Start Doxycycline antibiotic for the infection. Take 1 tablet by mouth twice daily for 7 days.  You may take tramadol twice daily as needed for severe pain.  Stop by the lab prior to leaving today. I will notify you of your results once received.   It was a pleasure to see you today!

## 2023-11-21 NOTE — Assessment & Plan Note (Signed)
 Exam today suspicious for cellulitis, especially in the setting of a low-grade fever and her overall presentation today.  Lower suspicion for DVT but will keep on differential list.  Start doxycycline 100 mg twice daily x 7 days. Close follow-up next week.

## 2023-11-26 ENCOUNTER — Ambulatory Visit: Admitting: Primary Care

## 2023-11-27 ENCOUNTER — Encounter: Payer: Self-pay | Admitting: Student in an Organized Health Care Education/Training Program

## 2023-11-27 ENCOUNTER — Ambulatory Visit: Admitting: Student in an Organized Health Care Education/Training Program

## 2023-11-27 ENCOUNTER — Telehealth: Payer: Self-pay | Admitting: Cardiovascular Disease

## 2023-11-27 VITALS — BP 128/80 | HR 105 | Temp 98.7°F | Ht 63.0 in | Wt 177.8 lb

## 2023-11-27 DIAGNOSIS — J432 Centrilobular emphysema: Secondary | ICD-10-CM | POA: Diagnosis not present

## 2023-11-27 DIAGNOSIS — Z87891 Personal history of nicotine dependence: Secondary | ICD-10-CM | POA: Diagnosis not present

## 2023-11-27 DIAGNOSIS — R911 Solitary pulmonary nodule: Secondary | ICD-10-CM

## 2023-11-27 NOTE — Progress Notes (Signed)
 Synopsis: Referred in for pulmonary nodule by [name], Denise Purple, MD  Assessment & Plan:   #Centrilobular emphysema [facility])  Does have a history of smoking and centrilobular emphysema is noted on her chest CT's. She is currently maintained on ICS/LABA. I will obtain pulmonary function test to evaluate for COPD and if that is the case I will consider adding LAMA therapy to her treatment regimen.  - Pulmonary Function Test; Future  #Lung nodule  Nodule Location: Left lower lobe Nodule Size 15 x 16 mm Nodule Spiculation: No Associated Lymphadenopathy no Smoking Status (former) and pack years 38 Extrathoracic cancer > 5 years prior (no) SPN malignancy risk score [facility] Model): 15 %risk of malignancy ECOG: 1  The patient is here to discuss their imaging abnormalities which include nodule in the left lower lobe that has shown interval growth on repeat imaging from 10 mm to 15 mm.  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 She will need a biopsy to establish the diagnosis. This has to be weighed against risk of stroke secondary to her carotid stenosis as well as risk of mechanical ventilation following her recent traumatic pneumothorax. I have reached out to her other providers to discuss prioritizing endarterectomy vs biopsy, and appears that endarterectomy is elective for stroke risk reduction. We will proceed with biopsy to establish a diagnosis.  We discussed the importance of diagnosis and staging in lung malignancies, and the approach to obtaining a tissue diagnosis which would include robotic assisted navigational bronchoscopy with endobronchial ultrasound guided sampling.  We also discussed the risks associated with the procedure which include a 2% risk of pneumothorax, infection, bleeding, and nondiagnostic procedure in detail.  I explained that patients typically are able to return home the same day of the procedure, but in rare  cases admission to the hospital for observation and treatment is required.  After our discussion, the patient elected to proceed with the procedure  Recommendations: -Robotic assisted navigational bronchoscopy -CT chest super D -Pulmonary Function Test; Future  I spent 60 minutes caring for this patient today, including preparing to see the patient, obtaining a medical history , reviewing a separately obtained history, performing a medically appropriate examination and/or evaluation, counseling and educating the patient/family/caregiver, ordering medications, tests, or procedures, referring and communicating with other health care professionals (not separately reported), documenting clinical information in the electronic health record, and independently interpreting results (not separately reported/billed) and communicating results to the patient/family/caregiver  Denise Glasgow, MD [facility]  End of visit medications:  No orders of the defined types were placed in this encounter.    Current Outpatient Medications:    acetaminophen  (TYLENOL ) 500 MG tablet, Take 2 tablets (1,000 mg total) by mouth every 8 (eight) hours as needed., Disp: , Rfl:    albuterol  (VENTOLIN  HFA) 108 (90 Base) MCG/ACT inhaler, INHALE 2 PUFFS INTO THE LUNGS EVERY 4-6 HOURS AS NEEDED FOR TIGHTNESS/WHEEZING (Patient taking differently: Inhale 2 puffs into the lungs every 6 (six) hours as needed for wheezing or shortness of breath.), Disp: 8.5 each, Rfl: 0   amLODipine  (NORVASC ) 10 MG tablet, TAKE 1 TABLET BY MOUTH EVERY DAY FOR BLOOD PRESSURE (Patient taking differently: Take 10 mg by mouth daily.), Disp: 90 tablet, Rfl: 2   Ascorbic Acid (VITAMIN C) 1000 MG tablet, Take 1,000 mg by mouth daily., Disp: , Rfl:    aspirin  EC 81 MG tablet, Take 162 mg by mouth daily., Disp: , Rfl:  atorvastatin  (LIPITOR ) 80 MG tablet, Take 1 tablet (80 mg total) by mouth daily. for cholesterol., Disp: 90 tablet, Rfl:  3   diphenhydrAMINE (BENADRYL) 25 MG tablet, Take 25 mg by mouth every 6 (six) hours as needed for allergies., Disp: , Rfl:    doxycycline  (VIBRA -TABS) 100 MG tablet, Take 1 tablet (100 mg total) by mouth 2 (two) times daily., Disp: 14 tablet, Rfl: 0   escitalopram  (LEXAPRO ) 20 MG tablet, Take 1 tablet (20 mg total) by mouth daily. For anxiety, Disp: 90 tablet, Rfl: 0   famotidine  (PEPCID ) 20 MG tablet, Take 20 mg by mouth daily as needed for heartburn or indigestion., Disp: , Rfl:    ferrous sulfate  325 (65 FE) MG tablet, Take 1 tablet (325 mg total) by mouth daily with breakfast., Disp: 60 tablet, Rfl: 0   fluticasone -salmeterol (WIXELA INHUB) 250-50 MCG/ACT AEPB, INHALE 1 PUFF INTO THE LUNGS IN THE MORNING AND AT BEDTIME., Disp: 180 each, Rfl: 1   furosemide  (LASIX ) 20 MG tablet, Take 1 tablet by mouth once daily for 5 days for leg swelling., Disp: 5 tablet, Rfl: 0   pyridOXINE (VITAMIN B-6) 25 MG tablet, Take 25 mg by mouth daily., Disp: , Rfl:    sodium chloride  1 g tablet, Take 1 tablet (1 g total) by mouth 2 (two) times daily with a meal., Disp: 60 tablet, Rfl: 0   traMADol  (ULTRAM ) 50 MG tablet, Take 1 tablet (50 mg total) by mouth every 12 (twelve) hours as needed for severe pain (pain score 7-10)., Disp: 14 tablet, Rfl: 0   vitamin B-12 (CYANOCOBALAMIN ) 1000 MCG tablet, Take 1,000 mcg by mouth daily., Disp: , Rfl:    Vitamin D , Ergocalciferol , (DRISDOL ) 1.25 MG (50000 UNIT) CAPS capsule, Take 1 capsule (50,000 Units total) by mouth every 7 (seven) days., Disp: 7 capsule, Rfl: 0   vitamin E 180 MG (400 UNITS) capsule, Take 800 Units by mouth daily., Disp: , Rfl:    methocarbamol  (ROBAXIN ) 500 MG tablet, Take 1 tablet (500 mg total) by mouth every 8 (eight) hours as needed for muscle spasms. (Patient not taking: Reported on 11/27/2023), Disp: 60 tablet, Rfl: 0   Subjective:   PATIENT ID: Denise [name] GENDER: female DOB: 11-29-1960, MRN: [id number]  Chief Complaint  Patient presents  with   New Patient (Initial Visit)    New pt: Left lower lung nodule, COPD, chest imaging: 10/26/23 - 11/20/23, recent fall 10/26/23 resulting in ED visit from dizziness.  Still sore from fall, SOB and DOE, lots of inflammation in her body, having right sided swelling in her leg more so than in her left however both are swollen and she is due to see PCP on Friday, coughing with phlegm -- unsure what color. Been having congestion as well.    HPI  Pleasant 63 year old female presenting to clinic for the evaluation of a pulmonary nodule.  She is presenting after recent admission to the hospital with traumatic pneumothorax. She continues to have right sided chest wall tenderness, with pain on deep inspiration. She reports exertional dyspnea that is chronic, and is currently maintained on ICS/LABA with Wixela. She is reporting an occasional cough. No weight changes are reported.  In 2024, she was seen by Dr. Fulton Job from vascular surgery for evaluation of carotid artery disease and was being worked up for carotid endarterectomy. She was supposed to get her endarterectomy in March of 2025 but she fell a week prior to that resulting in rib fractures and a traumatic pneumothorax. A  chest tube was placed and she was hospitalized for a week. Chest CT noted severe subcutaneous emphysema as well as the left lower lobe pulmonary nodule.  Patient was first noted to have this pulmonary nodule in 2021 where it measured 10 x 10 mm. She had a repeat scan a year later showing the nodule to be stable. She also had a PET/CT in 2022 that did not show it to be FDG avid. She then underwent a coronary CT which again noted the pulmonary nodule, showing it to have grown compared to 2022. She was referred to pulmonary by Dr. Alroy Aspen for evaluation of said nodule.  Patient reports having smoked from the age of 71, quit in 2024.  She has around 38 pack years of smoking history.  No occupational exposures reported.  Ancillary  information including prior medications, full medical/surgical/family/social histories, and PFTs (when available) are listed below and have been reviewed.   Review of Systems  Constitutional:  Negative for chills, fever and weight loss.  Respiratory:  Positive for cough and shortness of breath. Negative for hemoptysis, sputum production and wheezing.   Cardiovascular:  Negative for chest pain.     Objective:   Vitals:   11/27/23 0951  BP: 128/80  Pulse: (!) 105  Temp: 98.7 F (37.1 C)  TempSrc: Oral  SpO2: 91%  Weight: 177 lb 12.8 oz (80.6 kg)  Height: 5\' 3"  (1.6 m)   91% on RA BMI Readings from Last 3 Encounters:  11/27/23 31.50 kg/m  11/21/23 30.38 kg/m  11/05/23 30.21 kg/m   Wt Readings from Last 3 Encounters:  11/27/23 177 lb 12.8 oz (80.6 kg)  11/21/23 177 lb (80.3 kg)  11/05/23 176 lb (79.8 kg)    Physical Exam Constitutional:      Appearance: Normal appearance.  Cardiovascular:     Rate and Rhythm: Normal rate and regular rhythm.     Pulses: Normal pulses.     Heart sounds: Normal heart sounds.  Pulmonary:     Effort: Pulmonary effort is normal.     Breath sounds: No wheezing or rales.     Comments: Decreased breath sounds bilaterally Neurological:     General: No focal deficit present.     Mental Status: She is alert. Mental status is at baseline.       Ancillary Information    Past Medical History:  Diagnosis Date   Allergy    Anemia 12/24/2019   slow Colon bleed   Anxiety    Arthritis    BMI 35.0-35.9,adult 03/31/2012   Chickenpox    Cholecystitis with cholelithiasis 03/31/2012   COPD (chronic obstructive pulmonary disease) (HCC)    Coronary artery disease    Nonobstructive   Depression    GERD (gastroesophageal reflux disease)    Headache    Heart murmur    High anion gap metabolic acidosis 10/26/2023   Hypertension 03/31/2012   Peripheral vascular disease (HCC)    Right Carotid Artery Stenosis   Pneumonia    Shingles       Family History  Problem Relation Age of Onset   Arthritis Mother    Cervical cancer Mother    Heart disease Father    Hypertension Father      Past Surgical History:  Procedure Laterality Date   BIOPSY  12/24/2019   Procedure: BIOPSY;  Surgeon: Janel Medford, MD;  Location: [facility] ENDOSCOPY;  Service: Endoscopy;;   CATARACT EXTRACTION Bilateral 2021   CHOLECYSTECTOMY  03/30/2012   Procedure: LAPAROSCOPIC CHOLECYSTECTOMY WITH  INTRAOPERATIVE CHOLANGIOGRAM;  Surgeon: Quitman Bucy, MD;  Location: [facility] ORS;  Service: General;  Laterality: N/A;   COLONOSCOPY WITH PROPOFOL  N/A 12/24/2019   Procedure: COLONOSCOPY WITH PROPOFOL ;  Surgeon: Janel Medford, MD;  Location: [facility] ENDOSCOPY;  Service: Endoscopy;  Laterality: N/A;   ESOPHAGOGASTRODUODENOSCOPY (EGD) WITH PROPOFOL  N/A 12/24/2019   Procedure: ESOPHAGOGASTRODUODENOSCOPY (EGD) WITH PROPOFOL ;  Surgeon: Janel Medford, MD;  Location: [facility] ENDOSCOPY;  Service: Endoscopy;  Laterality: N/A;   HOT HEMOSTASIS N/A 12/24/2019   Procedure: HOT HEMOSTASIS (ARGON PLASMA COAGULATION/BICAP);  Surgeon: Janel Medford, MD;  Location: [facility] ENDOSCOPY;  Service: Endoscopy;  Laterality: N/A;   POLYPECTOMY  12/24/2019   Procedure: POLYPECTOMY;  Surgeon: Janel Medford, MD;  Location[facility] ENDOSCOPY;  Service: Endoscopy;;   TUBAL LIGATION  1997    Social History   Socioeconomic History   Marital status: Widowed    Spouse name: Not on file   Number of children: 2   Years of education: Not on file   Highest education level: Associate degree: occupational, Scientist, product/process development, or vocational program  Occupational History   Not on file  Tobacco Use   Smoking status: Former    Current packs/day: 0.00    Average packs/day: 0.8 packs/day for 38.0 years (28.5 ttl pk-yrs)    Types: Cigarettes    Start date: 05/06/1985    Quit date: 05/07/2023    Years since quitting: 0.5   Smokeless tobacco: Never  Vaping Use   Vaping status: Never Used  Substance and Sexual  Activity   Alcohol use: Yes    Comment: occasionally   Drug use: No   Sexual activity: Never    Birth control/protection: None, Post-menopausal  Other Topics Concern   Not on file  Social History Narrative   Widow.    2 children.   Works in Clinical biochemist.   Enjoys reading, cooking.    Social Drivers of Corporate investment banker Strain: Low Risk  (04/15/2023)   Overall Financial Resource Strain (CARDIA)    Difficulty of Paying Living Expenses: Not very hard  Food Insecurity: No Food Insecurity (10/27/2023)   Hunger Vital Sign    Worried About Running Out of Food in the Last Year: Never true    Ran Out of Food in the Last Year: Never true  Transportation Needs: No Transportation Needs (04/15/2023)   PRAPARE - Administrator, Civil Service (Medical): No    Lack of Transportation (Non-Medical): No  Physical Activity: Insufficiently Active (04/15/2023)   Exercise Vital Sign    Days of Exercise per Week: 3 days    Minutes of Exercise per Session: 30 min  Stress: Stress Concern Present (04/15/2023)   [name] of Occupational Health - Occupational Stress Questionnaire    Feeling of Stress : Very much  Social Connections: Unknown (04/15/2023)   Social Connection and Isolation Panel [NHANES]    Frequency of Communication with Friends and Family: Three times a week    Frequency of Social Gatherings with Friends and Family: Patient declined    Attends Religious Services: Patient declined    Active Member of Clubs or Organizations: No    Attends Engineer, structural: Not on file    Marital Status: Widowed  Intimate Partner Violence: Not on file     Allergies  Allergen Reactions   Codeine Itching     CBC    Component Value Date/Time   WBC 8.9 11/21/2023 1254   RBC 4.29 11/21/2023 1254  HGB 13.5 11/21/2023 1254   HCT 42.8 11/21/2023 1254   PLT 383.0 11/21/2023 1254   MCV 99.8 11/21/2023 1254   MCH 33.1 10/31/2023 0551   MCHC 31.6 11/21/2023 1254    RDW 17.3 (H) 11/21/2023 1254   LYMPHSABS 1.0 11/21/2023 1254   MONOABS 0.6 11/21/2023 1254   EOSABS 0.3 11/21/2023 1254   BASOSABS 0.1 11/21/2023 1254    Pulmonary Functions Testing Results:     No data to display          Outpatient Medications Prior to Visit  Medication Sig Dispense Refill   acetaminophen  (TYLENOL ) 500 MG tablet Take 2 tablets (1,000 mg total) by mouth every 8 (eight) hours as needed.     albuterol  (VENTOLIN  HFA) 108 (90 Base) MCG/ACT inhaler INHALE 2 PUFFS INTO THE LUNGS EVERY 4-6 HOURS AS NEEDED FOR TIGHTNESS/WHEEZING (Patient taking differently: Inhale 2 puffs into the lungs every 6 (six) hours as needed for wheezing or shortness of breath.) 8.5 each 0   amLODipine  (NORVASC ) 10 MG tablet TAKE 1 TABLET BY MOUTH EVERY DAY FOR BLOOD PRESSURE (Patient taking differently: Take 10 mg by mouth daily.) 90 tablet 2   Ascorbic Acid (VITAMIN C) 1000 MG tablet Take 1,000 mg by mouth daily.     aspirin  EC 81 MG tablet Take 162 mg by mouth daily.     atorvastatin  (LIPITOR ) 80 MG tablet Take 1 tablet (80 mg total) by mouth daily. for cholesterol. 90 tablet 3   diphenhydrAMINE (BENADRYL) 25 MG tablet Take 25 mg by mouth every 6 (six) hours as needed for allergies.     doxycycline  (VIBRA -TABS) 100 MG tablet Take 1 tablet (100 mg total) by mouth 2 (two) times daily. 14 tablet 0   escitalopram  (LEXAPRO ) 20 MG tablet Take 1 tablet (20 mg total) by mouth daily. For anxiety 90 tablet 0   famotidine  (PEPCID ) 20 MG tablet Take 20 mg by mouth daily as needed for heartburn or indigestion.     ferrous sulfate  325 (65 FE) MG tablet Take 1 tablet (325 mg total) by mouth daily with breakfast. 60 tablet 0   fluticasone -salmeterol (WIXELA INHUB) 250-50 MCG/ACT AEPB INHALE 1 PUFF INTO THE LUNGS IN THE MORNING AND AT BEDTIME. 180 each 1   furosemide  (LASIX ) 20 MG tablet Take 1 tablet by mouth once daily for 5 days for leg swelling. 5 tablet 0   pyridOXINE (VITAMIN B-6) 25 MG tablet Take 25 mg by  mouth daily.     sodium chloride  1 g tablet Take 1 tablet (1 g total) by mouth 2 (two) times daily with a meal. 60 tablet 0   traMADol  (ULTRAM ) 50 MG tablet Take 1 tablet (50 mg total) by mouth every 12 (twelve) hours as needed for severe pain (pain score 7-10). 14 tablet 0   vitamin B-12 (CYANOCOBALAMIN ) 1000 MCG tablet Take 1,000 mcg by mouth daily.     Vitamin D , Ergocalciferol , (DRISDOL ) 1.25 MG (50000 UNIT) CAPS capsule Take 1 capsule (50,000 Units total) by mouth every 7 (seven) days. 7 capsule 0   vitamin E 180 MG (400 UNITS) capsule Take 800 Units by mouth daily.     methocarbamol  (ROBAXIN ) 500 MG tablet Take 1 tablet (500 mg total) by mouth every 8 (eight) hours as needed for muscle spasms. (Patient not taking: Reported on 11/27/2023) 60 tablet 0   No facility-administered medications prior to visit.

## 2023-11-27 NOTE — Telephone Encounter (Signed)
 [place] [employer] Calling to check if we rec'vd Long term disability forms. Requesting cb

## 2023-11-27 NOTE — H&P (View-Only) (Signed)
 Synopsis: Referred in for pulmonary nodule by Nahser, Lela Purple, MD  Assessment & Plan:   #Centrilobular emphysema [facility])  Does have a history of smoking and centrilobular emphysema is noted on her chest CT's. She is currently maintained on ICS/LABA. I will obtain pulmonary function test to evaluate for COPD and if that is the case I will consider adding LAMA therapy to her treatment regimen.  - Pulmonary Function Test; Future  #Lung nodule  Nodule Location: Left lower lobe Nodule Size 15 x 16 mm Nodule Spiculation: No Associated Lymphadenopathy no Smoking Status (former) and pack years 38 Extrathoracic cancer > 5 years prior (no) SPN malignancy risk score [facility] Model): 15 %risk of malignancy ECOG: 1  The patient is here to discuss their imaging abnormalities which include nodule in the left lower lobe that has shown interval growth on repeat imaging from 10 mm to 15 mm.  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 She will need a biopsy to establish the diagnosis. This has to be weighed against risk of stroke secondary to her carotid stenosis as well as risk of mechanical ventilation following her recent traumatic pneumothorax. I have reached out to her other providers to discuss prioritizing endarterectomy vs biopsy, and appears that endarterectomy is elective for stroke risk reduction. We will proceed with biopsy to establish a diagnosis.  We discussed the importance of diagnosis and staging in lung malignancies, and the approach to obtaining a tissue diagnosis which would include robotic assisted navigational bronchoscopy with endobronchial ultrasound guided sampling.  We also discussed the risks associated with the procedure which include a 2% risk of pneumothorax, infection, bleeding, and nondiagnostic procedure in detail.  I explained that patients typically are able to return home the same day of the procedure, but in rare  cases admission to the hospital for observation and treatment is required.  After our discussion, the patient elected to proceed with the procedure  Recommendations: -Robotic assisted navigational bronchoscopy -CT chest super D -Pulmonary Function Test; Future  I spent 60 minutes caring for this patient today, including preparing to see the patient, obtaining a medical history , reviewing a separately obtained history, performing a medically appropriate examination and/or evaluation, counseling and educating the patient/family/caregiver, ordering medications, tests, or procedures, referring and communicating with other health care professionals (not separately reported), documenting clinical information in the electronic health record, and independently interpreting results (not separately reported/billed) and communicating results to the patient/family/caregiver  Vergia Glasgow, MD [facility]  End of visit medications:  No orders of the defined types were placed in this encounter.    Current Outpatient Medications:    acetaminophen  (TYLENOL ) 500 MG tablet, Take 2 tablets (1,000 mg total) by mouth every 8 (eight) hours as needed., Disp: , Rfl:    albuterol  (VENTOLIN  HFA) 108 (90 Base) MCG/ACT inhaler, INHALE 2 PUFFS INTO THE LUNGS EVERY 4-6 HOURS AS NEEDED FOR TIGHTNESS/WHEEZING (Patient taking differently: Inhale 2 puffs into the lungs every 6 (six) hours as needed for wheezing or shortness of breath.), Disp: 8.5 each, Rfl: 0   amLODipine  (NORVASC ) 10 MG tablet, TAKE 1 TABLET BY MOUTH EVERY DAY FOR BLOOD PRESSURE (Patient taking differently: Take 10 mg by mouth daily.), Disp: 90 tablet, Rfl: 2   Ascorbic Acid (VITAMIN C) 1000 MG tablet, Take 1,000 mg by mouth daily., Disp: , Rfl:    aspirin  EC 81 MG tablet, Take 162 mg by mouth daily., Disp: , Rfl:  atorvastatin  (LIPITOR ) 80 MG tablet, Take 1 tablet (80 mg total) by mouth daily. for cholesterol., Disp: 90 tablet, Rfl:  3   diphenhydrAMINE (BENADRYL) 25 MG tablet, Take 25 mg by mouth every 6 (six) hours as needed for allergies., Disp: , Rfl:    doxycycline  (VIBRA -TABS) 100 MG tablet, Take 1 tablet (100 mg total) by mouth 2 (two) times daily., Disp: 14 tablet, Rfl: 0   escitalopram  (LEXAPRO ) 20 MG tablet, Take 1 tablet (20 mg total) by mouth daily. For anxiety, Disp: 90 tablet, Rfl: 0   famotidine  (PEPCID ) 20 MG tablet, Take 20 mg by mouth daily as needed for heartburn or indigestion., Disp: , Rfl:    ferrous sulfate  325 (65 FE) MG tablet, Take 1 tablet (325 mg total) by mouth daily with breakfast., Disp: 60 tablet, Rfl: 0   fluticasone -salmeterol (WIXELA INHUB) 250-50 MCG/ACT AEPB, INHALE 1 PUFF INTO THE LUNGS IN THE MORNING AND AT BEDTIME., Disp: 180 each, Rfl: 1   furosemide  (LASIX ) 20 MG tablet, Take 1 tablet by mouth once daily for 5 days for leg swelling., Disp: 5 tablet, Rfl: 0   pyridOXINE (VITAMIN B-6) 25 MG tablet, Take 25 mg by mouth daily., Disp: , Rfl:    sodium chloride  1 g tablet, Take 1 tablet (1 g total) by mouth 2 (two) times daily with a meal., Disp: 60 tablet, Rfl: 0   traMADol  (ULTRAM ) 50 MG tablet, Take 1 tablet (50 mg total) by mouth every 12 (twelve) hours as needed for severe pain (pain score 7-10)., Disp: 14 tablet, Rfl: 0   vitamin B-12 (CYANOCOBALAMIN ) 1000 MCG tablet, Take 1,000 mcg by mouth daily., Disp: , Rfl:    Vitamin D , Ergocalciferol , (DRISDOL ) 1.25 MG (50000 UNIT) CAPS capsule, Take 1 capsule (50,000 Units total) by mouth every 7 (seven) days., Disp: 7 capsule, Rfl: 0   vitamin E 180 MG (400 UNITS) capsule, Take 800 Units by mouth daily., Disp: , Rfl:    methocarbamol  (ROBAXIN ) 500 MG tablet, Take 1 tablet (500 mg total) by mouth every 8 (eight) hours as needed for muscle spasms. (Patient not taking: Reported on 11/27/2023), Disp: 60 tablet, Rfl: 0   Subjective:   PATIENT ID: Denise [name] GENDER: female DOB: 11-29-1960, MRN: [id number]  Chief Complaint  Patient presents  with   New Patient (Initial Visit)    New pt: Left lower lung nodule, COPD, chest imaging: 10/26/23 - 11/20/23, recent fall 10/26/23 resulting in ED visit from dizziness.  Still sore from fall, SOB and DOE, lots of inflammation in her body, having right sided swelling in her leg more so than in her left however both are swollen and she is due to see PCP on Friday, coughing with phlegm -- unsure what color. Been having congestion as well.    HPI  Pleasant 63 year old female presenting to clinic for the evaluation of a pulmonary nodule.  She is presenting after recent admission to the hospital with traumatic pneumothorax. She continues to have right sided chest wall tenderness, with pain on deep inspiration. She reports exertional dyspnea that is chronic, and is currently maintained on ICS/LABA with Wixela. She is reporting an occasional cough. No weight changes are reported.  In 2024, she was seen by Dr. Fulton Job from vascular surgery for evaluation of carotid artery disease and was being worked up for carotid endarterectomy. She was supposed to get her endarterectomy in March of 2025 but she fell a week prior to that resulting in rib fractures and a traumatic pneumothorax. A  chest tube was placed and she was hospitalized for a week. Chest CT noted severe subcutaneous emphysema as well as the left lower lobe pulmonary nodule.  Patient was first noted to have this pulmonary nodule in 2021 where it measured 10 x 10 mm. She had a repeat scan a year later showing the nodule to be stable. She also had a PET/CT in 2022 that did not show it to be FDG avid. She then underwent a coronary CT which again noted the pulmonary nodule, showing it to have grown compared to 2022. She was referred to pulmonary by Dr. Alroy Aspen for evaluation of said nodule.  Patient reports having smoked from the age of 71, quit in 2024.  She has around 38 pack years of smoking history.  No occupational exposures reported.  Ancillary  information including prior medications, full medical/surgical/family/social histories, and PFTs (when available) are listed below and have been reviewed.   Review of Systems  Constitutional:  Negative for chills, fever and weight loss.  Respiratory:  Positive for cough and shortness of breath. Negative for hemoptysis, sputum production and wheezing.   Cardiovascular:  Negative for chest pain.     Objective:   Vitals:   11/27/23 0951  BP: 128/80  Pulse: (!) 105  Temp: 98.7 F (37.1 C)  TempSrc: Oral  SpO2: 91%  Weight: 177 lb 12.8 oz (80.6 kg)  Height: 5\' 3"  (1.6 m)   91% on RA BMI Readings from Last 3 Encounters:  11/27/23 31.50 kg/m  11/21/23 30.38 kg/m  11/05/23 30.21 kg/m   Wt Readings from Last 3 Encounters:  11/27/23 177 lb 12.8 oz (80.6 kg)  11/21/23 177 lb (80.3 kg)  11/05/23 176 lb (79.8 kg)    Physical Exam Constitutional:      Appearance: Normal appearance.  Cardiovascular:     Rate and Rhythm: Normal rate and regular rhythm.     Pulses: Normal pulses.     Heart sounds: Normal heart sounds.  Pulmonary:     Effort: Pulmonary effort is normal.     Breath sounds: No wheezing or rales.     Comments: Decreased breath sounds bilaterally Neurological:     General: No focal deficit present.     Mental Status: She is alert. Mental status is at baseline.       Ancillary Information    Past Medical History:  Diagnosis Date   Allergy    Anemia 12/24/2019   slow Colon bleed   Anxiety    Arthritis    BMI 35.0-35.9,adult 03/31/2012   Chickenpox    Cholecystitis with cholelithiasis 03/31/2012   COPD (chronic obstructive pulmonary disease) (HCC)    Coronary artery disease    Nonobstructive   Depression    GERD (gastroesophageal reflux disease)    Headache    Heart murmur    High anion gap metabolic acidosis 10/26/2023   Hypertension 03/31/2012   Peripheral vascular disease (HCC)    Right Carotid Artery Stenosis   Pneumonia    Shingles       Family History  Problem Relation Age of Onset   Arthritis Mother    Cervical cancer Mother    Heart disease Father    Hypertension Father      Past Surgical History:  Procedure Laterality Date   BIOPSY  12/24/2019   Procedure: BIOPSY;  Surgeon: Janel Medford, MD;  Location: [facility] ENDOSCOPY;  Service: Endoscopy;;   CATARACT EXTRACTION Bilateral 2021   CHOLECYSTECTOMY  03/30/2012   Procedure: LAPAROSCOPIC CHOLECYSTECTOMY WITH  INTRAOPERATIVE CHOLANGIOGRAM;  Surgeon: Quitman Bucy, MD;  Location: [facility] ORS;  Service: General;  Laterality: N/A;   COLONOSCOPY WITH PROPOFOL  N/A 12/24/2019   Procedure: COLONOSCOPY WITH PROPOFOL ;  Surgeon: Janel Medford, MD;  Location: [facility] ENDOSCOPY;  Service: Endoscopy;  Laterality: N/A;   ESOPHAGOGASTRODUODENOSCOPY (EGD) WITH PROPOFOL  N/A 12/24/2019   Procedure: ESOPHAGOGASTRODUODENOSCOPY (EGD) WITH PROPOFOL ;  Surgeon: Janel Medford, MD;  Location: [facility] ENDOSCOPY;  Service: Endoscopy;  Laterality: N/A;   HOT HEMOSTASIS N/A 12/24/2019   Procedure: HOT HEMOSTASIS (ARGON PLASMA COAGULATION/BICAP);  Surgeon: Janel Medford, MD;  Location: [facility] ENDOSCOPY;  Service: Endoscopy;  Laterality: N/A;   POLYPECTOMY  12/24/2019   Procedure: POLYPECTOMY;  Surgeon: Janel Medford, MD;  Location[facility] ENDOSCOPY;  Service: Endoscopy;;   TUBAL LIGATION  1997    Social History   Socioeconomic History   Marital status: Widowed    Spouse name: Not on file   Number of children: 2   Years of education: Not on file   Highest education level: Associate degree: occupational, Scientist, product/process development, or vocational program  Occupational History   Not on file  Tobacco Use   Smoking status: Former    Current packs/day: 0.00    Average packs/day: 0.8 packs/day for 38.0 years (28.5 ttl pk-yrs)    Types: Cigarettes    Start date: 05/06/1985    Quit date: 05/07/2023    Years since quitting: 0.5   Smokeless tobacco: Never  Vaping Use   Vaping status: Never Used  Substance and Sexual  Activity   Alcohol use: Yes    Comment: occasionally   Drug use: No   Sexual activity: Never    Birth control/protection: None, Post-menopausal  Other Topics Concern   Not on file  Social History Narrative   Widow.    2 children.   Works in Clinical biochemist.   Enjoys reading, cooking.    Social Drivers of Corporate investment banker Strain: Low Risk  (04/15/2023)   Overall Financial Resource Strain (CARDIA)    Difficulty of Paying Living Expenses: Not very hard  Food Insecurity: No Food Insecurity (10/27/2023)   Hunger Vital Sign    Worried About Running Out of Food in the Last Year: Never true    Ran Out of Food in the Last Year: Never true  Transportation Needs: No Transportation Needs (04/15/2023)   PRAPARE - Administrator, Civil Service (Medical): No    Lack of Transportation (Non-Medical): No  Physical Activity: Insufficiently Active (04/15/2023)   Exercise Vital Sign    Days of Exercise per Week: 3 days    Minutes of Exercise per Session: 30 min  Stress: Stress Concern Present (04/15/2023)   [name] of Occupational Health - Occupational Stress Questionnaire    Feeling of Stress : Very much  Social Connections: Unknown (04/15/2023)   Social Connection and Isolation Panel [NHANES]    Frequency of Communication with Friends and Family: Three times a week    Frequency of Social Gatherings with Friends and Family: Patient declined    Attends Religious Services: Patient declined    Active Member of Clubs or Organizations: No    Attends Engineer, structural: Not on file    Marital Status: Widowed  Intimate Partner Violence: Not on file     Allergies  Allergen Reactions   Codeine Itching     CBC    Component Value Date/Time   WBC 8.9 11/21/2023 1254   RBC 4.29 11/21/2023 1254  HGB 13.5 11/21/2023 1254   HCT 42.8 11/21/2023 1254   PLT 383.0 11/21/2023 1254   MCV 99.8 11/21/2023 1254   MCH 33.1 10/31/2023 0551   MCHC 31.6 11/21/2023 1254    RDW 17.3 (H) 11/21/2023 1254   LYMPHSABS 1.0 11/21/2023 1254   MONOABS 0.6 11/21/2023 1254   EOSABS 0.3 11/21/2023 1254   BASOSABS 0.1 11/21/2023 1254    Pulmonary Functions Testing Results:     No data to display          Outpatient Medications Prior to Visit  Medication Sig Dispense Refill   acetaminophen  (TYLENOL ) 500 MG tablet Take 2 tablets (1,000 mg total) by mouth every 8 (eight) hours as needed.     albuterol  (VENTOLIN  HFA) 108 (90 Base) MCG/ACT inhaler INHALE 2 PUFFS INTO THE LUNGS EVERY 4-6 HOURS AS NEEDED FOR TIGHTNESS/WHEEZING (Patient taking differently: Inhale 2 puffs into the lungs every 6 (six) hours as needed for wheezing or shortness of breath.) 8.5 each 0   amLODipine  (NORVASC ) 10 MG tablet TAKE 1 TABLET BY MOUTH EVERY DAY FOR BLOOD PRESSURE (Patient taking differently: Take 10 mg by mouth daily.) 90 tablet 2   Ascorbic Acid (VITAMIN C) 1000 MG tablet Take 1,000 mg by mouth daily.     aspirin  EC 81 MG tablet Take 162 mg by mouth daily.     atorvastatin  (LIPITOR ) 80 MG tablet Take 1 tablet (80 mg total) by mouth daily. for cholesterol. 90 tablet 3   diphenhydrAMINE (BENADRYL) 25 MG tablet Take 25 mg by mouth every 6 (six) hours as needed for allergies.     doxycycline  (VIBRA -TABS) 100 MG tablet Take 1 tablet (100 mg total) by mouth 2 (two) times daily. 14 tablet 0   escitalopram  (LEXAPRO ) 20 MG tablet Take 1 tablet (20 mg total) by mouth daily. For anxiety 90 tablet 0   famotidine  (PEPCID ) 20 MG tablet Take 20 mg by mouth daily as needed for heartburn or indigestion.     ferrous sulfate  325 (65 FE) MG tablet Take 1 tablet (325 mg total) by mouth daily with breakfast. 60 tablet 0   fluticasone -salmeterol (WIXELA INHUB) 250-50 MCG/ACT AEPB INHALE 1 PUFF INTO THE LUNGS IN THE MORNING AND AT BEDTIME. 180 each 1   furosemide  (LASIX ) 20 MG tablet Take 1 tablet by mouth once daily for 5 days for leg swelling. 5 tablet 0   pyridOXINE (VITAMIN B-6) 25 MG tablet Take 25 mg by  mouth daily.     sodium chloride  1 g tablet Take 1 tablet (1 g total) by mouth 2 (two) times daily with a meal. 60 tablet 0   traMADol  (ULTRAM ) 50 MG tablet Take 1 tablet (50 mg total) by mouth every 12 (twelve) hours as needed for severe pain (pain score 7-10). 14 tablet 0   vitamin B-12 (CYANOCOBALAMIN ) 1000 MCG tablet Take 1,000 mcg by mouth daily.     Vitamin D , Ergocalciferol , (DRISDOL ) 1.25 MG (50000 UNIT) CAPS capsule Take 1 capsule (50,000 Units total) by mouth every 7 (seven) days. 7 capsule 0   vitamin E 180 MG (400 UNITS) capsule Take 800 Units by mouth daily.     methocarbamol  (ROBAXIN ) 500 MG tablet Take 1 tablet (500 mg total) by mouth every 8 (eight) hours as needed for muscle spasms. (Patient not taking: Reported on 11/27/2023) 60 tablet 0   No facility-administered medications prior to visit.

## 2023-11-28 ENCOUNTER — Telehealth: Payer: Self-pay

## 2023-11-28 ENCOUNTER — Encounter

## 2023-11-28 NOTE — Telephone Encounter (Signed)
 [facility] Robotic Bronchoscopy 12/10/2023 at 1:15pm Lung Nodule [id number], [id number]  Denise [name] please see Bronch info

## 2023-11-28 NOTE — Telephone Encounter (Signed)
 Lm x1 for the patient.

## 2023-11-28 NOTE — Telephone Encounter (Signed)
 Returned call to [employer] and let them know that I have not received any fax for this patient. Called states that he will reach out to assigned case manager and have it re-faxed. Confirmed fax #.

## 2023-11-28 NOTE — Telephone Encounter (Signed)
 I notified the patient. She would like for the procedure to be done at [facility].   Dr. Darnelle Elders please advise on the next available date.

## 2023-11-28 NOTE — Telephone Encounter (Signed)
 For the codes 01601, S2900 Prior Auth Not Required Refer # [id number] & [id number]

## 2023-11-29 ENCOUNTER — Ambulatory Visit: Admitting: Primary Care

## 2023-11-29 VITALS — BP 140/82 | HR 113 | Temp 98.6°F | Ht 63.0 in | Wt 179.0 lb

## 2023-11-29 DIAGNOSIS — E871 Hypo-osmolality and hyponatremia: Secondary | ICD-10-CM

## 2023-11-29 DIAGNOSIS — R600 Localized edema: Secondary | ICD-10-CM | POA: Diagnosis not present

## 2023-11-29 DIAGNOSIS — E222 Syndrome of inappropriate secretion of antidiuretic hormone: Secondary | ICD-10-CM | POA: Diagnosis not present

## 2023-11-29 DIAGNOSIS — S2241XA Multiple fractures of ribs, right side, initial encounter for closed fracture: Secondary | ICD-10-CM | POA: Diagnosis not present

## 2023-11-29 MED ORDER — TRAMADOL HCL 50 MG PO TABS: 100.0000 mg | ORAL_TABLET | Freq: Two times a day (BID) | ORAL | 0 refills | Status: DC | PRN

## 2023-11-29 MED ORDER — FUROSEMIDE 20 MG PO TABS: ORAL_TABLET | ORAL | 0 refills | Status: DC

## 2023-11-29 NOTE — Assessment & Plan Note (Signed)
 Temporary improvement with furosemide  treatment but now with return of swelling.  Resume furosemide  20 to 40 mg daily. BMP pending.  She will update early next week. Consider venous ultrasound if no improvement.

## 2023-11-29 NOTE — Assessment & Plan Note (Signed)
 Repeat BMP pending. Remain off sodium chloride  tablets.

## 2023-11-29 NOTE — Progress Notes (Signed)
 Subjective:    Patient ID: Denise [name], female    DOB: June 07, 1961, 63 y.o.   MRN: [id number]  Leg Pain   Chest Pain  Associated symptoms include leg pain. Pertinent negatives include no fever.    Denise [name] is a very pleasant 63 y.o. female with a history of hypertension, CAD, carotid artery disease, COPD, SIADH, multiple rib fractures, iron deficiency anemia, who presents today for follow up of cellulitis.  She was last evaluated on 11/21/2023 for follow up of hyponatremia.  During this visit she was noted to cellulitis to the right lower extremity.  She was initiated on doxycycline  100 mg twice daily x 7 days. Repeat sodium levels showed improvement.   Today she endorses improved redness and swelling with reduced pain to the right lower extremity temporarily. Five days ago she began noticing swelling and pain to the left lower extremity without erythema. She began taking her furosemide  and noticed improvement in swelling. She completed her Doxycycline  two days ago and has noticed a return in her erythema on the right lower extremity.  Her last sodium chloride  tablet was five days ago.  She cannot stand taking these pills.  She also believes that these pills are causing her swelling.  Her last dose of furosemide  was four days ago.  She denies fevers, chills.  She continues to experience pain from her rib fractures.   Review of Systems  Constitutional:  Negative for fever.  Cardiovascular:  Positive for leg swelling.  Skin:  Positive for color change.         Past Medical History:  Diagnosis Date   Allergy    Anemia 12/24/2019   slow Colon bleed   Anxiety    Arthritis    BMI 35.0-35.9,adult 03/31/2012   Chickenpox    Cholecystitis with cholelithiasis 03/31/2012   COPD (chronic obstructive pulmonary disease) (HCC)    Coronary artery disease    Nonobstructive   Depression    GERD (gastroesophageal reflux disease)    Headache    Heart murmur    High anion  gap metabolic acidosis 10/26/2023   Hypertension 03/31/2012   Peripheral vascular disease (HCC)    Right Carotid Artery Stenosis   Pneumonia    Shingles     Social History   Socioeconomic History   Marital status: Widowed    Spouse name: Not on file   Number of children: 2   Years of education: Not on file   Highest education level: 12th grade  Occupational History   Not on file  Tobacco Use   Smoking status: Former    Current packs/day: 0.00    Average packs/day: 0.8 packs/day for 38.0 years (28.5 ttl pk-yrs)    Types: Cigarettes    Start date: 05/06/1985    Quit date: 05/07/2023    Years since quitting: 0.5   Smokeless tobacco: Never  Vaping Use   Vaping status: Never Used  Substance and Sexual Activity   Alcohol use: Yes    Comment: occasionally   Drug use: No   Sexual activity: Never    Birth control/protection: None, Post-menopausal  Other Topics Concern   Not on file  Social History Narrative   Widow.    2 children.   Works in Clinical biochemist.   Enjoys reading, cooking.    Social Drivers of Corporate investment banker Strain: Low Risk  (11/29/2023)   Overall Financial Resource Strain (CARDIA)    Difficulty of Paying Living Expenses: Not very hard  Food Insecurity: Patient Declined (11/29/2023)   Hunger Vital Sign    Worried About Running Out of Food in the Last Year: Patient declined    Ran Out of Food in the Last Year: Patient declined  Transportation Needs: Unmet Transportation Needs (11/29/2023)   PRAPARE - Transportation    Lack of Transportation (Medical): No    Lack of Transportation (Non-Medical): Yes  Physical Activity: Inactive (11/29/2023)   Exercise Vital Sign    Days of Exercise per Week: 0 days    Minutes of Exercise per Session: 30 min  Stress: Stress Concern Present (11/29/2023)   [name] of Occupational Health - Occupational Stress Questionnaire    Feeling of Stress : Very much  Social Connections: Unknown (11/29/2023)   Social  Connection and Isolation Panel [NHANES]    Frequency of Communication with Friends and Family: More than three times a week    Frequency of Social Gatherings with Friends and Family: Never    Attends Religious Services: Patient declined    Database administrator or Organizations: No    Attends Engineer, structural: Not on file    Marital Status: Widowed  Intimate Partner Violence: Not on file    Past Surgical History:  Procedure Laterality Date   BIOPSY  12/24/2019   Procedure: BIOPSY;  Surgeon: Janel Medford, MD;  Location: [facility] ENDOSCOPY;  Service: Endoscopy;;   CATARACT EXTRACTION Bilateral 2021   CHOLECYSTECTOMY  03/30/2012   Procedure: LAPAROSCOPIC CHOLECYSTECTOMY WITH INTRAOPERATIVE CHOLANGIOGRAM;  Surgeon: Quitman Bucy, MD;  Location: [facility] ORS;  Service: General;  Laterality: N/A;   COLONOSCOPY WITH PROPOFOL  N/A 12/24/2019   Procedure: COLONOSCOPY WITH PROPOFOL ;  Surgeon: Janel Medford, MD;  Location: [facility] ENDOSCOPY;  Service: Endoscopy;  Laterality: N/A;   ESOPHAGOGASTRODUODENOSCOPY (EGD) WITH PROPOFOL  N/A 12/24/2019   Procedure: ESOPHAGOGASTRODUODENOSCOPY (EGD) WITH PROPOFOL ;  Surgeon: Janel Medford, MD;  Location: [facility] ENDOSCOPY;  Service: Endoscopy;  Laterality: N/A;   HOT HEMOSTASIS N/A 12/24/2019   Procedure: HOT HEMOSTASIS (ARGON PLASMA COAGULATION/BICAP);  Surgeon: Janel Medford, MD;  Location: [facility] ENDOSCOPY;  Service: Endoscopy;  Laterality: N/A;   POLYPECTOMY  12/24/2019   Procedure: POLYPECTOMY;  Surgeon: Janel Medford, MD;  Location: [facility] ENDOSCOPY;  Service: Endoscopy;;   TUBAL LIGATION  1997    Family History  Problem Relation Age of Onset   Arthritis Mother    Cervical cancer Mother    Heart disease Father    Hypertension Father     Allergies  Allergen Reactions   Codeine Itching    Current Outpatient Medications on File Prior to Visit  Medication Sig Dispense Refill   acetaminophen  (TYLENOL ) 500 MG tablet Take 2 tablets (1,000 mg  total) by mouth every 8 (eight) hours as needed.     albuterol  (VENTOLIN  HFA) 108 (90 Base) MCG/ACT inhaler INHALE 2 PUFFS INTO THE LUNGS EVERY 4-6 HOURS AS NEEDED FOR TIGHTNESS/WHEEZING (Patient taking differently: Inhale 2 puffs into the lungs every 6 (six) hours as needed for wheezing or shortness of breath.) 8.5 each 0   amLODipine  (NORVASC ) 10 MG tablet TAKE 1 TABLET BY MOUTH EVERY DAY FOR BLOOD PRESSURE (Patient taking differently: Take 10 mg by mouth daily.) 90 tablet 2   Ascorbic Acid (VITAMIN C) 1000 MG tablet Take 1,000 mg by mouth daily.     aspirin  EC 81 MG tablet Take 162 mg by mouth daily.     atorvastatin  (LIPITOR ) 80 MG tablet Take 1 tablet (80 mg total) by mouth daily. for  cholesterol. 90 tablet 3   diphenhydrAMINE (BENADRYL) 25 MG tablet Take 25 mg by mouth every 6 (six) hours as needed for allergies.     doxycycline  (VIBRA -TABS) 100 MG tablet Take 1 tablet (100 mg total) by mouth 2 (two) times daily. 14 tablet 0   escitalopram  (LEXAPRO ) 20 MG tablet Take 1 tablet (20 mg total) by mouth daily. For anxiety 90 tablet 0   famotidine  (PEPCID ) 20 MG tablet Take 20 mg by mouth daily as needed for heartburn or indigestion.     ferrous sulfate  325 (65 FE) MG tablet Take 1 tablet (325 mg total) by mouth daily with breakfast. 60 tablet 0   fluticasone -salmeterol (WIXELA INHUB) 250-50 MCG/ACT AEPB INHALE 1 PUFF INTO THE LUNGS IN THE MORNING AND AT BEDTIME. 180 each 1   methocarbamol  (ROBAXIN ) 500 MG tablet Take 1 tablet (500 mg total) by mouth every 8 (eight) hours as needed for muscle spasms. 60 tablet 0   pyridOXINE (VITAMIN B-6) 25 MG tablet Take 25 mg by mouth daily.     sodium chloride  1 g tablet Take 1 tablet (1 g total) by mouth 2 (two) times daily with a meal. 60 tablet 0   vitamin B-12 (CYANOCOBALAMIN ) 1000 MCG tablet Take 1,000 mcg by mouth daily.     Vitamin D , Ergocalciferol , (DRISDOL ) 1.25 MG (50000 UNIT) CAPS capsule Take 1 capsule (50,000 Units total) by mouth every 7 (seven)  days. 7 capsule 0   vitamin E 180 MG (400 UNITS) capsule Take 800 Units by mouth daily.     No current facility-administered medications on file prior to visit.    BP (!) 140/82   Pulse (!) 113   Temp 98.6 F (37 C) (Temporal)   Ht 5\' 3"  (1.6 m)   Wt 179 lb (81.2 kg)   SpO2 93%   BMI 31.71 kg/m  Objective:   Physical Exam Cardiovascular:     Pulses:          Posterior tibial pulses are 1+ on the right side and 1+ on the left side.  Musculoskeletal:     Right lower leg: 1+ Pitting Edema present.     Left lower leg: 3+ Pitting Edema present.  Skin:    General: Skin is warm and dry.     Findings: Erythema present.  Neurological:     Mental Status: She is alert.           Assessment & Plan:  Hyponatremia Assessment & Plan: Repeat BMP pending. Remain off sodium chloride  tablets.   Bilateral lower extremity edema Assessment & Plan: Temporary improvement with furosemide  treatment but now with return of swelling.  Resume furosemide  20 to 40 mg daily. BMP pending.  She will update early next week. Consider venous ultrasound if no improvement.  Orders: -     Basic metabolic panel with GFR -     CBC with Differential/Platelet -     Furosemide ; Take 1-2 tablets by mouth once daily for leg swelling.  Dispense: 30 tablet; Refill: 0  SIADH (syndrome of inappropriate ADH production) (HCC) Assessment & Plan: Remain off sodium chloride  tablets. Repeat BMP pending.  Resume furosemide  20 to 40 mg daily.  Orders: -     Furosemide ; Take 1-2 tablets by mouth once daily for leg swelling.  Dispense: 30 tablet; Refill: 0  Multiple fractures of ribs, right side, initial encounter for closed fracture Assessment & Plan: Continued   Refills provided for Tramadol .  Take 50-100 mg BID PRN.  Orders: -  traMADol  HCl; Take 2 tablets (100 mg total) by mouth every 12 (twelve) hours as needed for severe pain (pain score 7-10).  Dispense: 60 tablet; Refill:  0        Gabriel John, NP

## 2023-11-29 NOTE — Assessment & Plan Note (Signed)
 Continued   Refills provided for Tramadol .  Take 50-100 mg BID PRN.

## 2023-11-29 NOTE — Patient Instructions (Signed)
 Stop by the lab prior to leaving today. I will notify you of your results once received.   Remain off the salt tablets.  Start furosemide  20 mg tablets.  Take 1 tablet by mouth daily for leg swelling.  You may take 2 Tramadol  tablets twice daily as needed for pain.  Please update me regarding your swelling next week.  It was a pleasure to see you today!

## 2023-11-29 NOTE — Assessment & Plan Note (Signed)
 Remain off sodium chloride  tablets. Repeat BMP pending.  Resume furosemide  20 to 40 mg daily.

## 2023-11-30 LAB — CBC WITH DIFFERENTIAL/PLATELET
Absolute Lymphocytes: 771 {cells}/uL — ABNORMAL LOW (ref 850–3900)
Absolute Monocytes: 897 {cells}/uL (ref 200–950)
Basophils Absolute: 69 {cells}/uL (ref 0–200)
Basophils Relative: 0.6 %
Eosinophils Absolute: 196 {cells}/uL (ref 15–500)
Eosinophils Relative: 1.7 %
HCT: 45.1 % — ABNORMAL HIGH (ref 35.0–45.0)
Hemoglobin: 13.9 g/dL (ref 11.7–15.5)
MCH: 30.2 pg (ref 27.0–33.0)
MCHC: 30.8 g/dL — ABNORMAL LOW (ref 32.0–36.0)
MCV: 97.8 fL (ref 80.0–100.0)
MPV: 9.5 fL (ref 7.5–12.5)
Monocytes Relative: 7.8 %
Neutro Abs: 9568 {cells}/uL — ABNORMAL HIGH (ref 1500–7800)
Neutrophils Relative %: 83.2 %
Platelets: 386 10*3/uL (ref 140–400)
RBC: 4.61 10*6/uL (ref 3.80–5.10)
RDW: 14.1 % (ref 11.0–15.0)
Total Lymphocyte: 6.7 %
WBC: 11.5 10*3/uL — ABNORMAL HIGH (ref 3.8–10.8)

## 2023-11-30 LAB — BASIC METABOLIC PANEL WITH GFR
BUN: 10 mg/dL (ref 7–25)
CO2: 23 mmol/L (ref 20–32)
Calcium: 9.2 mg/dL (ref 8.6–10.4)
Chloride: 100 mmol/L (ref 98–110)
Creat: 0.54 mg/dL (ref 0.50–1.05)
Glucose, Bld: 98 mg/dL (ref 65–99)
Potassium: 4.3 mmol/L (ref 3.5–5.3)
Sodium: 135 mmol/L (ref 135–146)
eGFR: 104 mL/min/{1.73_m2} (ref >=60)

## 2023-12-01 DIAGNOSIS — R600 Localized edema: Secondary | ICD-10-CM

## 2023-12-01 DIAGNOSIS — E871 Hypo-osmolality and hyponatremia: Secondary | ICD-10-CM

## 2023-12-01 DIAGNOSIS — L539 Erythematous condition, unspecified: Secondary | ICD-10-CM

## 2023-12-01 DIAGNOSIS — E222 Syndrome of inappropriate secretion of antidiuretic hormone: Secondary | ICD-10-CM

## 2023-12-02 ENCOUNTER — Ambulatory Visit: Admitting: Student in an Organized Health Care Education/Training Program

## 2023-12-02 DIAGNOSIS — R911 Solitary pulmonary nodule: Secondary | ICD-10-CM

## 2023-12-02 DIAGNOSIS — J432 Centrilobular emphysema: Secondary | ICD-10-CM | POA: Diagnosis not present

## 2023-12-02 LAB — PULMONARY FUNCTION TEST
DL/VA % pred: 56 %
DL/VA: 2.37 ml/min/mmHg/L
DLCO cor % pred: 23 %
DLCO cor: 4.5 ml/min/mmHg
DLCO unc % pred: 23 %
DLCO unc: 4.57 ml/min/mmHg
FEF 25-75 Post: 0.58 L/s
FEF 25-75 Pre: 0.56 L/s
FEF2575-%Change-Post: 2 %
FEF2575-%Pred-Post: 26 %
FEF2575-%Pred-Pre: 25 %
FEV1-%Change-Post: 2 %
FEV1-%Pred-Post: 53 %
FEV1-%Pred-Pre: 52 %
FEV1-Post: 1.29 L
FEV1-Pre: 1.26 L
FEV1FVC-%Change-Post: 1 %
FEV1FVC-%Pred-Pre: 61 %
FEV6-%Change-Post: 1 %
FEV6-%Pred-Post: 85 %
FEV6-%Pred-Pre: 85 %
FEV6-Post: 2.59 L
FEV6-Pre: 2.56 L
FEV6FVC-%Change-Post: 1 %
FEV6FVC-%Pred-Post: 102 %
FEV6FVC-%Pred-Pre: 100 %
FVC-%Change-Post: 0 %
FVC-%Pred-Post: 84 %
FVC-%Pred-Pre: 84 %
FVC-Post: 2.66 L
FVC-Pre: 2.64 L
Post FEV1/FVC ratio: 48 %
Post FEV6/FVC ratio: 99 %
Pre FEV1/FVC ratio: 48 %
Pre FEV6/FVC Ratio: 97 %
RV % pred: 149 %
RV: 2.95 L
TLC % pred: 101 %
TLC: 4.99 L

## 2023-12-02 MED ORDER — DOXYCYCLINE HYCLATE 100 MG PO TABS: 100.0000 mg | ORAL_TABLET | Freq: Two times a day (BID) | ORAL | 0 refills | Status: DC

## 2023-12-02 NOTE — Progress Notes (Signed)
 Full PFT completed today ? ?

## 2023-12-02 NOTE — Patient Instructions (Signed)
 Full PFT completed today ? ?

## 2023-12-02 NOTE — Telephone Encounter (Signed)
 Noted. Nothing further needed.

## 2023-12-04 ENCOUNTER — Other Ambulatory Visit: Payer: Self-pay

## 2023-12-04 ENCOUNTER — Encounter
Admission: RE | Admit: 2023-12-04 | Discharge: 2023-12-04 | Disposition: A | Discharge status: Home / Self Care | Source: Ambulatory Visit | Attending: Student in an Organized Health Care Education/Training Program | Admitting: Student in an Organized Health Care Education/Training Program

## 2023-12-04 ENCOUNTER — Encounter: Payer: Self-pay | Admitting: Urgent Care

## 2023-12-04 ENCOUNTER — Telehealth: Payer: Self-pay | Admitting: *Deleted

## 2023-12-04 VITALS — Ht 63.0 in | Wt 177.0 lb

## 2023-12-04 DIAGNOSIS — I6523 Occlusion and stenosis of bilateral carotid arteries: Secondary | ICD-10-CM

## 2023-12-04 DIAGNOSIS — I10 Essential (primary) hypertension: Secondary | ICD-10-CM

## 2023-12-04 DIAGNOSIS — L539 Erythematous condition, unspecified: Secondary | ICD-10-CM

## 2023-12-04 DIAGNOSIS — J449 Chronic obstructive pulmonary disease, unspecified: Secondary | ICD-10-CM

## 2023-12-04 HISTORY — DX: Dyspnea, unspecified: R06.00

## 2023-12-04 NOTE — Telephone Encounter (Signed)
Left message to call back and schedule tele pre op appt

## 2023-12-04 NOTE — Telephone Encounter (Signed)
   Pre-operative Risk Assessment    Patient Name: Denise [name]  DOB: 05/09/1961 MRN: [id number]   Date of last office visit: 09/06/23 DR. NAHSER Date of next office visit: NONE   Request for Surgical Clearance    Procedure:     ROBOTIC ASSISTED NAVIGATIONAL BRONCHOSCOPY   Date of Surgery:  Clearance 12/10/23                                Surgeon:  DR. DGAYLI Surgeon's Group or Practice Name:  [facility] Phone number:  [phone] Fax number:  [phone] [name], FNP   Type of Clearance Requested:   - Medical  - Pharmacy:  Hold Aspirin      Type of Anesthesia:  General    Additional requests/questions:    [name]   12/04/2023, 3:47 PM

## 2023-12-04 NOTE — Telephone Encounter (Signed)
-----   Message from Shirline Dover sent at 12/04/2023  2:54 PM EDT ----- Regarding: Request for pre-operative cardiac clearance Request for pre-operative cardiac clearance:  1. What type of surgery is being performed?  ROBOTIC ASSISTED NAVIGATIONAL BRONCHOSCOPY  2. When is this surgery scheduled?  12/10/2023  3. Type of clearance being requested (medical, pharmacy, both)? MEDICAL   4. Are there any medications that need to be held prior to surgery? ASA  5. Practice name and name of physician performing surgery?  Performing surgeon: Dr. Vergia Glasgow, MD Requesting clearance: [name], FNP-C    6. Anesthesia type (none, local, MAC, general)? GENERAL  7. What is the office phone and fax number?   Fax: [phone]  ATTENTION: Unable to create telephone message as per your standard workflow. Directed by [facility] providers to send requests for cardiac clearance to this pool for appropriate distribution to provider covering pre-operative clearances.   [name], MSN, APRN, FNP-C, CEN [facility]  Peri-operative Services Nurse Practitioner Phone: [phone] 12/04/23 2:54 PM

## 2023-12-04 NOTE — Patient Instructions (Addendum)
 Your procedure is scheduled on:  Tuesday 12/10/23 Report to the Registration Desk on the 1st floor of the [facility]. To find out your arrival time, please call [phone] between 1PM - 3PM on: Monday 12/09/23  If your arrival time is 6:00 am, do not arrive before that time as the Medical Mall entrance doors do not open until 6:00 am.  REMEMBER: Instructions that are not followed completely may result in serious medical risk, up to and including death; or upon the discretion of your surgeon and anesthesiologist your surgery may need to be rescheduled.  Do not eat food or drink fluids after midnight the night before surgery.  No gum chewing or hard candies.   One week prior to surgery: Stop Anti-inflammatories (NSAIDS) such as Advil, Aleve, Ibuprofen, Motrin, Naproxen, Naprosyn and Aspirin  based products such as Excedrin, Goody's Powder, BC Powder. Stop ANY OVER THE COUNTER supplements until after surgery.  You may however, continue to take Tylenol  if needed for pain up until the day of surgery.  Decrease aspirin  81 mg  to 1 until the day before surgery. Then no aspirin  the day of surgery.   Continue taking all of your other prescription medications up until the day of surgery.  ON THE DAY OF SURGERY ONLY TAKE THESE MEDICATIONS WITH SIPS OF WATER:  amLODipine  (NORVASC ) 10 MG  doxycycline  (VIBRA -TABS) 100 MG  escitalopram  (LEXAPRO ) 20 MG  famotidine  (PEPCID ) 20 MG   Use inhalers on the day of surgery and bring to the hospital. fluticasone -salmeterol (WIXELA INHUB) 250-50 MCG/ACT AEPB  albuterol  (VENTOLIN  HFA) 108 (90 Base) MCG/ACT inhaler    No Alcohol for 24 hours before or after surgery.  No Smoking including e-cigarettes for 24 hours before surgery.  No chewable tobacco products for at least 6 hours before surgery.  No nicotine patches on the day of surgery.  Do not use any "recreational" drugs for at least a week (preferably 2 weeks) before your surgery.  Please be  advised that the combination of cocaine and anesthesia may have negative outcomes, up to and including death. If you test positive for cocaine, your surgery will be cancelled.  On the morning of surgery brush your teeth with toothpaste and water, you may rinse your mouth with mouthwash if you wish. Do not swallow any toothpaste or mouthwash.  Use CHG Soap or wipes as directed on instruction sheet.  Do not wear jewelry, make-up, hairpins, clips or nail polish.  For welded (permanent) jewelry: bracelets, anklets, waist bands, etc.  Please have this removed prior to surgery.  If it is not removed, there is a chance that hospital personnel will need to cut it off on the day of surgery.  Do not wear lotions, powders, or perfumes.   Do not shave body hair from the neck down 48 hours before surgery.  Contact lenses, hearing aids and dentures may not be worn into surgery.  Do not bring valuables to the hospital. [facility] is not responsible for any missing/lost belongings or valuables.   Notify your doctor if there is any change in your medical condition (cold, fever, infection).  Wear comfortable clothing (specific to your surgery type) to the hospital.  After surgery, you can help prevent lung complications by doing breathing exercises.  Take deep breaths and cough every 1-2 hours. Your doctor may order a device called an Incentive Spirometer to help you take deep breaths. When coughing or sneezing, hold a pillow firmly against your incision with both hands. This is  called "splinting." Doing this helps protect your incision. It also decreases belly discomfort.  If you are being admitted to the hospital overnight, leave your suitcase in the car. After surgery it may be brought to your room.  In case of increased patient census, it may be necessary for you, the patient, to continue your postoperative care in the Same Day Surgery department.  If you are being discharged the day of surgery,  you will not be allowed to drive home. You will need a responsible individual to drive you home and stay with you for 24 hours after surgery.   If you are taking public transportation, you will need to have a responsible individual with you.  Please call the Pre-admissions Testing Dept. at [phone] if you have any questions about these instructions.  Surgery Visitation Policy:  Patients having surgery or a procedure may have two visitors.  Children under the age of 28 must have an adult with them who is not the patient.  Inpatient Visitation:    Visiting hours are 7 a.m. to 8 p.m. Up to four visitors are allowed at one time in a patient room. The visitors may rotate out with other people during the day.  One visitor age 62 or older may stay with the patient overnight and must be in the room by 8 p.m.

## 2023-12-04 NOTE — Telephone Encounter (Signed)
   Name: Denise [name]  DOB: [birth year]  MRN: [id number]  Primary Cardiologist: Ahmad Alert, MD Last OV: 09/06/23 with Dr. Alroy Aspen  Preoperative team, please contact this patient and set up a phone call appointment for further preoperative risk assessment. Please obtain consent and complete medication review. Thank you for your help.  I confirm that guidance regarding antiplatelet and oral anticoagulation therapy has been completed and, if necessary, noted below. From a cardiac standpoint patient can hold aspirin  81 mg daily for 5-7 days prior to procedure however aspirin  is not prescribed by cardiology office, final recommendations should come from prescribing office.   I also confirmed the patient resides in the state of  . As per [facility] Medical Board telemedicine laws, the patient must reside in the state in which the provider is licensed.  Channing Yeager D Huzaifa Viney, NP 12/04/2023, 4:52 PM [facility]

## 2023-12-05 ENCOUNTER — Ambulatory Visit
Admission: RE | Admit: 2023-12-05 | Discharge: 2023-12-05 | Disposition: A | Discharge status: Home / Self Care | Source: Ambulatory Visit | Attending: Student in an Organized Health Care Education/Training Program | Admitting: Student in an Organized Health Care Education/Training Program

## 2023-12-05 ENCOUNTER — Telehealth: Payer: Self-pay

## 2023-12-05 ENCOUNTER — Other Ambulatory Visit

## 2023-12-05 DIAGNOSIS — R911 Solitary pulmonary nodule: Secondary | ICD-10-CM

## 2023-12-05 NOTE — Telephone Encounter (Signed)
 Spoke with patient who is agreeable to do a tele visit on 5/2 at 1:20 pm. Med rec and consent have been done.

## 2023-12-05 NOTE — Telephone Encounter (Signed)
  Patient Consent for Virtual Visit        [name] has provided verbal consent on 12/05/2023 for a virtual visit (video or telephone).   CONSENT FOR VIRTUAL VISIT FOR:  Denise [name]  By participating in this virtual visit I agree to the following:  I hereby voluntarily request, consent and authorize [facility] and its employed or contracted physicians, physician assistants, nurse practitioners or other licensed health care professionals (the Practitioner), to provide me with telemedicine health care services (the "Services") as deemed necessary by the treating Practitioner. I acknowledge and consent to receive the Services by the Practitioner via telemedicine. I understand that the telemedicine visit will involve communicating with the Practitioner through live audiovisual communication technology and the disclosure of certain medical information by electronic transmission. I acknowledge that I have been given the opportunity to request an in-person assessment or other available alternative prior to the telemedicine visit and am voluntarily participating in the telemedicine visit.  I understand that I have the right to withhold or withdraw my consent to the use of telemedicine in the course of my care at any time, without affecting my right to future care or treatment, and that the Practitioner or I may terminate the telemedicine visit at any time. I understand that I have the right to inspect all information obtained and/or recorded in the course of the telemedicine visit and may receive copies of available information for a reasonable fee.  I understand that some of the potential risks of receiving the Services via telemedicine include:  Delay or interruption in medical evaluation due to technological equipment failure or disruption; Information transmitted may not be sufficient (e.g. poor resolution of images) to allow for appropriate medical decision making by the  Practitioner; and/or  In rare instances, security protocols could fail, causing a breach of personal health information.  Furthermore, I acknowledge that it is my responsibility to provide information about my medical history, conditions and care that is complete and accurate to the best of my ability. I acknowledge that Practitioner's advice, recommendations, and/or decision may be based on factors not within their control, such as incomplete or inaccurate data provided by me or distortions of diagnostic images or specimens that may result from electronic transmissions. I understand that the practice of medicine is not an exact science and that Practitioner makes no warranties or guarantees regarding treatment outcomes. I acknowledge that a copy of this consent can be made available to me via my patient portal [facility] MyChart), or I can request a printed copy by calling the office of [facility].    I understand that my insurance will be billed for this visit.   I have read or had this consent read to me. I understand the contents of this consent, which adequately explains the benefits and risks of the Services being provided via telemedicine.  I have been provided ample opportunity to ask questions regarding this consent and the Services and have had my questions answered to my satisfaction. I give my informed consent for the services to be provided through the use of telemedicine in my medical care

## 2023-12-06 ENCOUNTER — Encounter
Admission: RE | Admit: 2023-12-06 | Discharge: 2023-12-06 | Disposition: A | Discharge status: Home / Self Care | Source: Ambulatory Visit | Attending: Student in an Organized Health Care Education/Training Program | Admitting: Student in an Organized Health Care Education/Training Program

## 2023-12-06 ENCOUNTER — Ambulatory Visit: Attending: Cardiology

## 2023-12-06 ENCOUNTER — Encounter: Payer: Self-pay | Admitting: Student in an Organized Health Care Education/Training Program

## 2023-12-06 ENCOUNTER — Ambulatory Visit: Admitting: Cardiovascular Disease

## 2023-12-06 DIAGNOSIS — Z01812 Encounter for preprocedural laboratory examination: Secondary | ICD-10-CM | POA: Diagnosis present

## 2023-12-06 DIAGNOSIS — I10 Essential (primary) hypertension: Secondary | ICD-10-CM | POA: Insufficient documentation

## 2023-12-06 DIAGNOSIS — L539 Erythematous condition, unspecified: Secondary | ICD-10-CM | POA: Insufficient documentation

## 2023-12-06 DIAGNOSIS — Z01810 Encounter for preprocedural cardiovascular examination: Secondary | ICD-10-CM

## 2023-12-06 DIAGNOSIS — J449 Chronic obstructive pulmonary disease, unspecified: Secondary | ICD-10-CM | POA: Insufficient documentation

## 2023-12-06 DIAGNOSIS — I6523 Occlusion and stenosis of bilateral carotid arteries: Secondary | ICD-10-CM | POA: Insufficient documentation

## 2023-12-06 LAB — CBC
HCT: 47.3 % — ABNORMAL HIGH (ref 36.0–46.0)
Hemoglobin: 14.7 g/dL (ref 12.0–15.0)
MCH: 30.5 pg (ref 26.0–34.0)
MCHC: 31.1 g/dL (ref 30.0–36.0)
MCV: 98.1 fL (ref 80.0–100.0)
Platelets: 360 10*3/uL (ref 150–400)
RBC: 4.82 MIL/uL (ref 3.87–5.11)
RDW: 15.4 % (ref 11.5–15.5)
WBC: 11.2 10*3/uL — ABNORMAL HIGH (ref 4.0–10.5)
nRBC: 0 % (ref 0.0–0.2)

## 2023-12-06 NOTE — Progress Notes (Signed)
 Virtual Visit via Telephone Note   Because of Denise [name] co-morbid illnesses, she is at least at moderate risk for complications without adequate follow up.  This format is felt to be most appropriate for this patient at this time.  Due to technical limitations with video connection (technology), today's appointment will be conducted as an audio only telehealth visit, and Denise [name] verbally agreed to proceed in this manner.   All issues noted in this document were discussed and addressed.  No physical exam could be performed with this format.  Evaluation Performed:  Preoperative cardiovascular risk assessment _____________   Date:  12/06/2023   Patient ID:  Denise [name], DOB [birth year], MRN [id number] Patient Location:  Home Provider location:   Office  Primary Care Provider:  Gabriel John, NP Primary Cardiologist:  Ahmad Alert, MD  Chief Complaint / Patient Profile   63 y.o. y/o female with a h/o nonobstructive CAD per coronary CTA in 10/2023, hyperdynamic LV systolic function with a EF of 70 to 75% on echo on 10/15/2023, carotid artery disease, hypertension, hyperlipidemia, obesity history of cigarette smoking, SIADH.  Who is pending robotic assisted navigational bronchoscopy on 12/10/2023 and presents today for telephonic preoperative cardiovascular risk assessment.  History of Present Illness    Denise [name] is a 63 y.o. female who presents via audio/video conferencing for a telehealth visit today.  Pt was last seen in cardiology clinic on 09/06/2023 by Dr. Alroy Aspen for preoperative cardiac evaluation for a carotid endarterectomy.  Patient noted to be at low risk for procedure. At that time Denise [name] was doing well.  The patient is now pending procedure as outlined above. Since her last visit, she has remained stable from a cardiac standpoint.  Patient notes that she did have a fall earlier this year resulting in a large hematoma, since she has  had some increased lower extremity edema as well as some redness that has been treated by her PCP with Lasix  and antibiotics with noted improvement.  Patient's sodium chloride  tablets have been held also with improvement.  She has noted some increased weakness related to deconditioning following her fall that is improving. She denies chest pain, increased shortness of breath, palpitations melena, hematuria, hemoptysis, diaphoresis, presyncope, syncope, orthopnea, and PND.  She is able to achieve greater than 4 METS of activity.  Past Medical History    Past Medical History:  Diagnosis Date   Allergy    Anemia 12/24/2019   Anxiety    Aortic stenosis    Arthritis    Carotid artery disease (HCC)    a.) doppler 04/23/2023: 70-99% LEFT carotid bifurcation   Chickenpox    Cholecystitis with cholelithiasis 03/31/2012   Closed fracture of L4 transverse process s/p traumatic mechanical fall 10/26/2023   COPD (chronic obstructive pulmonary disease) (HCC)    Coronary artery disease    Depression    Diastolic dysfunction    Dyspnea    GERD (gastroesophageal reflux disease)    Headache    Heart murmur    High anion gap metabolic acidosis 10/26/2023   HLD (hyperlipidemia)    Hypertension 03/31/2012   Hyponatremia in setting of SIADH    Left lower lobe pulmonary nodule    Long-term use of aspirin  therapy    Lumbar compression fracture s/p traumatic mechanical fall; L4 superior endplate    Pneumonia    PVD (peripheral vascular disease) (HCC)    Shingles    SIADH (syndrome of inappropriate ADH production) (HCC)  Traumatic mechanical fall 10/26/2023   a.) s/p traumatic fall 10/26/2023 reuslting in multiple RIGHT rib (5th - 7th) fractures (required dchest tube), l$ transverse process fracture, L4 superior endplate compression fracture, large volume post-traumatic myofacial/subcutaneous gas collection in chest   Past Surgical History:  Procedure Laterality Date   BIOPSY  12/24/2019    Procedure: BIOPSY;  Surgeon: Janel Medford, MD;  Location: [facility] ENDOSCOPY;  Service: Endoscopy;;   CATARACT EXTRACTION Bilateral 2021   CHOLECYSTECTOMY  03/30/2012   Procedure: LAPAROSCOPIC CHOLECYSTECTOMY WITH INTRAOPERATIVE CHOLANGIOGRAM;  Surgeon: Quitman Bucy, MD;  Location: [facility] ORS;  Service: General;  Laterality: N/A;   COLONOSCOPY WITH PROPOFOL  N/A 12/24/2019   Procedure: COLONOSCOPY WITH PROPOFOL ;  Surgeon: Janel Medford, MD;  Location: [facility] ENDOSCOPY;  Service: Endoscopy;  Laterality: N/A;   ESOPHAGOGASTRODUODENOSCOPY (EGD) WITH PROPOFOL  N/A 12/24/2019   Procedure: ESOPHAGOGASTRODUODENOSCOPY (EGD) WITH PROPOFOL ;  Surgeon: Janel Medford, MD;  Location: [facility] ENDOSCOPY;  Service: Endoscopy;  Laterality: N/A;   HOT HEMOSTASIS N/A 12/24/2019   Procedure: HOT HEMOSTASIS (ARGON PLASMA COAGULATION/BICAP);  Surgeon: Janel Medford, MD;  Location: [facility] ENDOSCOPY;  Service: Endoscopy;  Laterality: N/A;   POLYPECTOMY  12/24/2019   Procedure: POLYPECTOMY;  Surgeon: Janel Medford, MD;  Location: [facility] ENDOSCOPY;  Service: Endoscopy;;   TUBAL LIGATION  1997    Allergies  No Known Allergies  Home Medications    Prior to Admission medications   Medication Sig Start Date End Date Taking? Authorizing Provider  acetaminophen  (TYLENOL ) 500 MG tablet Take 2 tablets (1,000 mg total) by mouth every 8 (eight) hours as needed. 10/31/23   Maczis, Michael M, PA-C  albuterol  (VENTOLIN  HFA) 108 (90 Base) MCG/ACT inhaler INHALE 2 PUFFS INTO THE LUNGS EVERY 4-6 HOURS AS NEEDED FOR TIGHTNESS/WHEEZING Patient taking differently: Inhale 2 puffs into the lungs every 6 (six) hours as needed for wheezing or shortness of breath. 04/26/23   Clark, Katherine K, NP  amLODipine  (NORVASC ) 10 MG tablet TAKE 1 TABLET BY MOUTH EVERY DAY FOR BLOOD PRESSURE Patient taking differently: Take 10 mg by mouth daily. 03/18/23   Clark, Katherine K, NP  Ascorbic Acid (VITAMIN C) 1000 MG tablet Take 1,000 mg by mouth daily.     [provider]  aspirin  EC 81 MG tablet Take 162 mg by mouth daily.    [provider]  atorvastatin  (LIPITOR ) 80 MG tablet Take 1 tablet (80 mg total) by mouth daily. for cholesterol. 01/03/23 01/03/24  Clark, Katherine K, NP  diphenhydrAMINE (BENADRYL) 25 MG tablet Take 25 mg by mouth every 6 (six) hours as needed for allergies.    [provider]  doxycycline  (VIBRA -TABS) 100 MG tablet Take 1 tablet (100 mg total) by mouth 2 (two) times daily. 12/02/23   Gabriel John, NP  escitalopram  (LEXAPRO ) 20 MG tablet Take 1 tablet (20 mg total) by mouth daily. For anxiety 09/18/23   Clark, Katherine K, NP  famotidine  (PEPCID ) 20 MG tablet Take 20 mg by mouth daily as needed for heartburn or indigestion.    [provider]  ferrous sulfate  325 (65 FE) MG tablet Take 1 tablet (325 mg total) by mouth daily with breakfast. 10/31/23 10/30/24  Maczis, Michael M, PA-C  fluticasone -salmeterol (WIXELA INHUB) 250-50 MCG/ACT AEPB INHALE 1 PUFF INTO THE LUNGS IN THE MORNING AND AT BEDTIME. 08/18/23   Clark, Katherine K, NP  furosemide  (LASIX ) 20 MG tablet Take 1-2 tablets by mouth once daily for leg swelling. 11/29/23   Clark, Katherine K, NP  methocarbamol  (ROBAXIN ) 500 MG tablet Take 1 tablet (500 mg total) by mouth every 8 (eight) hours as needed for muscle spasms. 10/31/23   Maczis, Michael M, PA-C  pyridOXINE (VITAMIN B-6) 25 MG tablet Take 25 mg by mouth daily.    [provider]  traMADol  (ULTRAM ) 50 MG tablet Take 2 tablets (100 mg total) by mouth every 12 (twelve) hours as needed for severe pain (pain score 7-10). 11/29/23   Gabriel John, NP  vitamin B-12 (CYANOCOBALAMIN ) 1000 MCG tablet Take 1,000 mcg by mouth daily.    [provider]  Vitamin D , Ergocalciferol , (DRISDOL ) 1.25 MG (50000 UNIT) CAPS capsule Take 1 capsule (50,000 Units total) by mouth every 7 (seven) days. 11/02/23   Gonfa, Taye T, MD  vitamin E 180 MG (400 UNITS) capsule Take 800 Units by  mouth daily.    [provider]    Physical Exam  Vital Signs:  Denise [name] does not have vital signs available for review today. Given telephonic nature of communication, physical exam is limited. AAOx3. NAD. Normal affect.  Speech and respirations are unlabored. Accessory Clinical Findings  None Assessment & Plan    1.  Preoperative Cardiovascular Risk Assessment: robotic assisted navigational bronchoscopy on 12/10/2023  Denise [name]'s perioperative risk of a major cardiac event is 0.4% according to the Revised Cardiac Risk Index (RCRI).  Therefore, she is at low risk for perioperative complications.   Her functional capacity is fair at 5.07 METs according to the [employer] Activity Status Index (DASI). Recommendations: According to ACC/AHA guidelines, no further cardiovascular testing needed.  The patient may proceed to surgery at acceptable risk.   Antiplatelet and/or Anticoagulation Recommendations: From a cardiac standpoint patient can hold aspirin  81 mg daily for 5-7 days prior to procedure however aspirin  is not prescribed by cardiology office, final recommendations should come from prescribing office.  The patient was advised that if she develops new symptoms prior to surgery to contact our office to arrange for a follow-up visit, and she verbalized understanding.  A copy of this note will be routed to requesting surgeon.  Time:   Today, I have spent 17 minutes with the patient with telehealth technology discussing medical history, symptoms, and management plan.    Shalinda Burkholder D Wong Steadham, NP  12/06/2023, 4:56 PM

## 2023-12-06 NOTE — Progress Notes (Signed)
 Perioperative / Anesthesia Services  Pre-Admission Testing Clinical Review / Pre-Operative Anesthesia Consult  Date: 12/06/23  Patient Demographics:  Name: Denise [name] DOB: [birth year] MRN:   [id number]  Planned Surgical Procedure(s):    Case: [id number] Date/Time: 12/10/23 1315   Procedure: ROBOTIC ASSISTED NAVIGATIONAL BRONCHOSCOPY (Bilateral)   Anesthesia type: General   Diagnosis: Lung nodule [R91.1]   Pre-op diagnosis: lung nodule   Location: [facility] PROCEDURE RM 02 / [facility]   Surgeons: Vergia Glasgow, MD      NOTE: Available PAT nursing documentation and vital signs have been reviewed. Clinical nursing staff has updated patient's PMH/PSHx, current medication list, and drug allergies/intolerances to ensure comprehensive history available to assist in medical decision making as it pertains to the aforementioned surgical procedure and anticipated anesthetic course. Extensive review of available clinical information personally performed. [place] PMH and PSHx updated with any diagnoses/procedures that  may have been inadvertently omitted during his intake with the pre-admission testing department's nursing staff.  Clinical Discussion:  Denise [name] is a 63 y.o. female who is submitted for pre-surgical anesthesia review and clearance prior to her undergoing the above procedure. Patient is a Current Smoker (28.5 pack years; quit 05/2023). Pertinent PMH includes: CAD, aortic stenosis, diastolic dysfunction, PVD, carotid artery disease, heart murmur, HTN, HLD, COPD, LEFT lower lobe pulmonary nodule, GERD (on daily H2 blocker), anemia, SIADH with hyponatremia, OA, anxiety.  Patient is followed by cardiology Alroy Aspen, MD). She was last seen in the cardiology clinic on 09/06/2023; notes reviewed. At the time of her clinic visit, patient complained of very mild chest pain with associated shortness of breath that was reported to be related to exertion.  Patient  denied any PND, orthopnea, palpitations, significant peripheral edema, weakness, fatigue, vertiginous symptoms, or presyncope/syncope. Patient with a past medical history significant for cardiovascular diagnoses. Documented physical exam was grossly benign, providing no evidence of acute exacerbation and/or decompensation of the patient's known cardiovascular conditions.  Patient with known significant BILATERAL carotid artery disease.  Most recent carotid Doppler study performed on 04/23/2023 revealed 70-99% stenosis at the LEFT carotid bifurcation.  Patient with asymmetric left upper extremity blood pressures suggesting subclavian artery stenosis.  No significant flow-limiting stenosis noted contralaterally in the RICA.  Coronary CTA was performed on 10/11/2023 that demonstrated an Agatston coronary artery calcium  score of 659. This placed patient in the 91st percentile for age, sex, and race matched controls; PPV severe (calcified 152 mm and noncalcified 556 mm. Calcium  depositions noted to be isolated mainly in the proximal LAD (70-99%), LCx (25-49%) and RCA (25-49% distributions.  There was severe lipomatous hypertrophy of the interatrial septum extending into the right atrium study demonstrates normal coronary origin with RIGHT dominance.  CT FFR analysis was performed (ranges: < 0.75 high likelihood of hemodynamically significant stenosis, 0.76-0.80 borderline, > 0.80 normal):  Left Main: 1.0; low likelihood of hemodynamic significance. Prox LAD: 0.87; low likelihood of hemodynamic significance. Mid LAD: 0.85; low likelihood of hemodynamic significance. Distal LAD: 0.79; low likelihood of hemodynamic significance (tapering artifact). D1: 0.96; low likelihood of hemodynamic significance. LCX: 0.98; low likelihood of hemodynamic significance. RCA: 0.94; low likelihood of hemodynamic significance.  Most recent TTE performed on 10/15/2023 revealed a normal left ventricular systolic function with a  hyperdynamic EF of 70-75% %. There was moderate concentric LVH.  There were no regional wall motion abnormalities. Left ventricular diastolic Doppler parameters consistent with abnormal relaxation (G1DD).left atrium moderately dilated.  Right ventricular size and function normal with a  TAPSE measuring 2.0 cm  (normal range >/= 1.6 cm).  RVSP = 46.3 mmHg. Mild calcification of the mitral valve leaflets and annulus was observed there was trivial to mild mitral, tricuspid and aortic valve regurgitation observed.  Aortic valve moderately stenotic with a mean transvalvular pressure gradient of 25.7 mmHg; AVA (VTI) 1.21 cm.  All other transvalvular gradients were noted to be normal providing no evidence suggestive of valvular stenosis. Aorta normal in size with no evidence of ectasia or aneurysmal dilatation.  Blood pressure well controlled at 122/68 mmHg on currently prescribed CCB (amlodipine ) and diuretic (furosemide ) therapies.  Patient is on atorvastatin  for her HLD diagnosis and ASCVD prevention. Patient is not diabetic. She does not have an OSAH diagnosis.  As previously mentioned, patient with some exertional related chest pain and shortness of breath.  With that said, she is able to complete all of her ADLs/IADLs without cardiovascular limitation.  Per the DASI, patient felt to be able to achieve >4 METS of physical activity without experiencing any significant degrees of angina/anginal equivalent symptoms. No changes were made to her medication regimen during her visit with cardiology.  Patient scheduled to follow-up with outpatient cardiology in 3 months or sooner if needed.  Denise [name] with known enlarging lobulated nodule in the LEFT lower pulmonary lobe.  Patient has met with pulmonary medicine and has been scheduled for further evaluation and tissue sampling for definitive diagnosis.  She is scheduled to undergo ROBOTIC ASSISTED NAVIGATIONAL BRONCHOSCOPY (Bilateral) on 12/10/2023 with Dr.  Vergia Glasgow, MD.  Given patient's past medical history significant for cardiovascular diagnoses, presurgical cardiac clearance was sought by the PAT team.  Per cardiology, "Ms. Cush's perioperative risk of a major cardiac event is 0.4% according to the Revised Cardiac Risk Index (RCRI).  Therefore, she is at low risk for perioperative complications.   Her functional capacity is fair at 5.07 METs according to the [place] Activity Status Index (DASI). According to ACC/AHA guidelines, no further cardiovascular testing needed.  The patient may proceed to surgery at acceptable risk".  In review of the patient's chart, it is noted that she is on daily oral antithrombotic therapy. Given that patient's past medical history is significant for cardiovascular diagnoses, including but not limited to CAD, pulmonary medicine has cleared patient to continue her daily low dose ASA throughout her perioperative course.  Patient has been updated on these directives from her specialty care providers by the PAT team.  Patient denies previous perioperative complications with anesthesia in the past. In review her EMR, it is noted that patient underwent a MAC anesthetic course at [facility] (ASA III) in 12/2019 without documented complications.      12/04/2023    1:05 PM 12/02/2023   11:10 AM 11/29/2023    2:20 PM  Vitals with BMI  Height 5\' 3"  5\' 3"  5\' 3"   Weight 177 lbs 175 lbs 179 lbs  BMI 31.36 31.01 31.72  Systolic   140  Diastolic   82  Pulse  91 113   Providers/Specialists:  NOTE: Primary physician provider listed below. Patient may have been seen by APP or partner within same practice.   PROVIDER ROLE / SPECIALTY LAST Florance Hun, MD Pulmonary Medicine (Surgeon) 11/27/2023  Gabriel John, NP Primary Care Provider 11/29/2023  Ahmad Alert, MD Cardiology 09/06/2023; preop APP call 12/06/2023   Allergies:  No Known Allergies Current Home Medications:   No current facility-administered  medications for this encounter.    acetaminophen  (TYLENOL ) 500  MG tablet   albuterol  (VENTOLIN  HFA) 108 (90 Base) MCG/ACT inhaler   amLODipine  (NORVASC ) 10 MG tablet   Ascorbic Acid (VITAMIN C) 1000 MG tablet   aspirin  EC 81 MG tablet   atorvastatin  (LIPITOR ) 80 MG tablet   diphenhydrAMINE (BENADRYL) 25 MG tablet   doxycycline  (VIBRA -TABS) 100 MG tablet   escitalopram  (LEXAPRO ) 20 MG tablet   famotidine  (PEPCID ) 20 MG tablet   ferrous sulfate  325 (65 FE) MG tablet   fluticasone -salmeterol (WIXELA INHUB) 250-50 MCG/ACT AEPB   furosemide  (LASIX ) 20 MG tablet   methocarbamol  (ROBAXIN ) 500 MG tablet   pyridOXINE (VITAMIN B-6) 25 MG tablet   traMADol  (ULTRAM ) 50 MG tablet   vitamin B-12 (CYANOCOBALAMIN ) 1000 MCG tablet   Vitamin D , Ergocalciferol , (DRISDOL ) 1.25 MG (50000 UNIT) CAPS capsule   vitamin E 180 MG (400 UNITS) capsule   History:   Past Medical History:  Diagnosis Date   Allergy    Anemia 12/24/2019   Anxiety    Arthritis    Carotid artery disease (HCC)    Chickenpox    Cholecystitis with cholelithiasis 03/31/2012   COPD (chronic obstructive pulmonary disease) (HCC)    Coronary artery disease    Depression    Dyspnea    GERD (gastroesophageal reflux disease)    Headache    Heart murmur    High anion gap metabolic acidosis 10/26/2023   HLD (hyperlipidemia)    Hypertension 03/31/2012   Hyponatremia in setting of SIADH    Long-term use of aspirin  therapy    Pneumonia    Shingles    SIADH (syndrome of inappropriate ADH production) (HCC)    Past Surgical History:  Procedure Laterality Date   BIOPSY  12/24/2019   Procedure: BIOPSY;  Surgeon: Janel Medford, MD;  Location: [facility] ENDOSCOPY;  Service: Endoscopy;;   CATARACT EXTRACTION Bilateral 2021   CHOLECYSTECTOMY  03/30/2012   Procedure: LAPAROSCOPIC CHOLECYSTECTOMY WITH INTRAOPERATIVE CHOLANGIOGRAM;  Surgeon: Quitman Bucy, MD;  Location: [facility] ORS;  Service: General;  Laterality: N/A;   COLONOSCOPY WITH  PROPOFOL  N/A 12/24/2019   Procedure: COLONOSCOPY WITH PROPOFOL ;  Surgeon: Janel Medford, MD;  Location: [facility] ENDOSCOPY;  Service: Endoscopy;  Laterality: N/A;   ESOPHAGOGASTRODUODENOSCOPY (EGD) WITH PROPOFOL  N/A 12/24/2019   Procedure: ESOPHAGOGASTRODUODENOSCOPY (EGD) WITH PROPOFOL ;  Surgeon: Janel Medford, MD;  Location: [facility] ENDOSCOPY;  Service: Endoscopy;  Laterality: N/A;   HOT HEMOSTASIS N/A 12/24/2019   Procedure: HOT HEMOSTASIS (ARGON PLASMA COAGULATION/BICAP);  Surgeon: Janel Medford, MD;  Location: [facility] ENDOSCOPY;  Service: Endoscopy;  Laterality: N/A;   POLYPECTOMY  12/24/2019   Procedure: POLYPECTOMY;  Surgeon: Janel Medford, MD;  Location: [facility] ENDOSCOPY;  Service: Endoscopy;;   TUBAL LIGATION  1997   Family History  Problem Relation Age of Onset   Arthritis Mother    Cervical cancer Mother    Heart disease Father    Hypertension Father    Social History   Tobacco Use   Smoking status: Former    Current packs/day: 0.00    Average packs/day: 0.8 packs/day for 38.0 years (28.5 ttl pk-yrs)    Types: Cigarettes    Start date: 05/06/1985    Quit date: 05/07/2023    Years since quitting: 0.5   Smokeless tobacco: Never  Substance Use Topics   Alcohol use: Yes    Alcohol/week: 1.0 standard drink of alcohol    Types: 1 Standard drinks or equivalent per week    Comment: occasionally   Pertinent Clinical Results:  LABS:  Lab  Results  Component Value Date   WBC 11.5 (H) 11/29/2023   HGB 13.9 11/29/2023   HCT 45.1 (H) 11/29/2023   MCV 97.8 11/29/2023   PLT 386 11/29/2023   Lab Results  Component Value Date   NA 135 11/29/2023   CL 100 11/29/2023   K 4.3 11/29/2023   CO2 23 11/29/2023   BUN 10 11/29/2023   CREATININE 0.54 11/29/2023   EGFR 104 11/29/2023   CALCIUM  9.2 11/29/2023   PHOS 3.4 11/05/2023   ALBUMIN 3.7 11/05/2023   GLUCOSE 98 11/29/2023    ECG: Date: 10/29/2023  Time ECG obtained: 0956 AM Rate: 95 bpm Rhythm:  Sinus rhythm with PACs Axis  (leads I and aVF): normal Intervals: PR 152 ms. QRS 76 ms. QTc 427 ms. ST segment and T wave changes: No evidence of acute T wave abnormalities or significant ST segment elevation or depression.  Evidence of a possible, age undetermined, prior infarct:  Yes; anterior Comparison: Similar to previous tracing obtained on 10/26/2023   IMAGING / PROCEDURES: DG CHEST 2 VIEW performed on 11/20/2023 Negative for pneumothorax. Small right-sided pleural effusion and trace left-sided pleural effusion. Interval near complete resolution of myofacial/subcutaneous gas.  CT CHEST ABDOMEN PELVIS W CONTRAST performed on 10/26/2023 Right rib fractures 5 through 7, the 6th rib is fractured in two places. Satisfactory right chest tube with trace residual right pneumothorax.  Emphysema with large air leak, extensive chest and body wall subcutaneous gas. Acute right L4 transverse process fracture and mild L4 superior endplate compression fracture which is very likely acute (10% loss of height, no retropulsion or complicating features). Confluent airspace opacity in the right lower lobe, early involvement of the right middle lobe. Opacified lower lobe airways. This is most suspicious for acute aspiration. Only trace pleural effusion/hemothorax. No mediastinal injury. Only trace pneumomediastinum secondary to #1. Suspicious left lower lobe lung nodule as detailed on recent cardiac CT 10/11/2023 (please see that report). Very severe aortic atherosclerosis with tandem severe/critical stenoses of the bilateral iliac and femoral arteries.  CT CERVICAL SPINE WO CONTRAST performed on 10/26/2023 Osteopenia, otherwise normal for age CT appearance of the cervical spine. Large volume posttraumatic superficial and deep soft tissue gas tracking up from the chest, reported separately.  CT HEAD WO CONTRAST performed on 10/26/2023 Large volume posttraumatic soft tissue gas tracking up from the chest (reported separately). No  superimposed acute traumatic injury identified in the Head. Negative for age noncontrast CT appearance of the brain.  TRANSTHORACIC ECHOCARDIOGRAM performed on 10/15/2023 Left ventricular ejection fraction, by estimation, is 70 to 75%. The left ventricle has hyperdynamic function. The left ventricle has no regional wall motion abnormalities. There is moderate concentric left ventricular hypertrophy. Left ventricular diastolic parameters are consistent with Grade I diastolic dysfunction (impaired relaxation).  Right ventricular systolic function is normal. The right ventricular size is normal.  Left atrial size was moderately dilated.  The mitral valve is normal in structure. Trivial mitral valve regurgitation. No evidence of mitral stenosis.  The aortic valve is tricuspid. There is moderate calcification of the aortic valve. Aortic valve regurgitation is mild. Moderate aortic valve stenosis. Aortic regurgitation PHT measures 404 msec. Aortic valve area, by VTI measures 1.21 cm. Aortic valve mean gradient measures 25.7 mmHg. Aortic valve Vmax measures 3.54 m/s.  The inferior vena cava is normal in size with greater than 50% respiratory variability, suggesting right atrial pressure of 3 mmHg.   CT CORONARY MORPH W/CTA COR W/SCORE W/CA W/CM &/OR WO/CM W/ FFR DATA PREP &  FLUID ANALYSIS performed on 10/11/2023 Slowly enlarging left lower lobe lung nodule, which was evaluated with PET-CT in 2022, but considering increased risk factors such as underlying Emphysema (ICD10-J43.9), Recommend referral to [facility]) if not already done, and additional evaluation to exclude a slow growing carcinoma.  Coronary calcium  score of 659. This was 98th percentile for age, sex, and race-matched controls. Total plaque volume 708 mm3 which is 91st percentile for age- and sex-matched controls (calcified plaque 152 mm3; non-calcified plaque 556 mm3). TPV is severe. Normal coronary  origin with right dominance. Severe mixed density plaque in the proximal LAD (70-99%). Mild mixed density plaque in the LCX (25-49%). Mild mixed density plaque in the RCA (25-49%). Severe lipomatous hypertrophy of the interatrial septum extending into the RA, correlate with echocardiography. FFR analysis (ranges: < 0.75 high likelihood of hemodynamically significant stenosis, 0.76-0.80 borderline, > 0.80 normal): Left Main: 1.0; low likelihood of hemodynamic significance. Prox LAD: 0.87; low likelihood of hemodynamic significance Mid LAD: 0.85; low likelihood of hemodynamic significance. Distal LAD: 0.79; low likelihood of hemodynamic significance (tapering artifact). D1: 0.96; low likelihood of hemodynamic significance. LCX: 0.98; low likelihood of hemodynamic significance. RCA: 0.94; low likelihood of hemodynamic significance.  US  CAROTID BILATERAL performed on 04/23/2023 Although the measured velocities of the right carotid bifurcation are not elevated, note that the flow velocities of the right ICA were obtained from an area distal to the maximum narrowing due to the presence of anterior wall plaque with shadowing. Given the degree of atherosclerotic plaque and the abnormal waveform distally, a hemodynamically significant stenosis at the right carotid bifurcation is favored. If establishing a more accurate degree of stenosis is required, cerebral angiogram should be considered, or as a second best test, CTA. Heterogeneous and partially calcified plaque at the left carotid bifurcation contributes to 70-99% stenosis. Asymmetric upper extremity blood pressures measured, lesser on the left, suggesting left subclavian artery stenosis. Correlation with office based upper extremity pressures may be useful.  Impression and Plan:  Denise [name] has been referred for pre-anesthesia review and clearance prior to her undergoing the planned anesthetic and procedural courses. Available labs,  pertinent testing, and imaging results were personally reviewed by me in preparation for upcoming operative/procedural course. [facility] medical record has been updated following extensive record review and patient interview with PAT staff.   This patient has been appropriately cleared by cardiology with an overall ACCEPTABLE risk of patient experiencing significant perioperative cardiovascular complications. Based on clinical review performed today (12/06/23), barring any significant acute changes in the patient's overall condition, it is anticipated that she will be able to proceed with the planned surgical intervention. Any acute changes in clinical condition may necessitate her procedure being postponed and/or cancelled. Patient will meet with anesthesia team (MD and/or CRNA) on the day of her procedure for preoperative evaluation/assessment. Questions regarding anesthetic course will be fielded at that time.   Pre-surgical instructions were reviewed with the patient during his PAT appointment, and questions were fielded to satisfaction by PAT clinical staff. She has been instructed on which medications that she will need to hold prior to surgery, as well as the ones that have been deemed safe/appropriate to take on the day of her procedure. As part of the general education provided by PAT, patient made aware both verbally and in writing, that she would need to abstain from the use of any illegal substances during her perioperative course. She was advised that failure to follow the provided instructions could necessitate  case cancellation or result in serious perioperative complications up to and including death. Patient encouraged to contact PAT and/or her surgeon's office to discuss any questions or concerns that may arise prior to surgery; verbalized understanding.   [name], MSN, APRN, FNP-C, CEN [facility]  Perioperative Services Nurse Practitioner Phone: [phone] Fax: [phone] 12/06/23 11:14 AM  NOTE: This note has been prepared using Dragon dictation software. Despite my best ability to proofread, there is always the potential that unintentional transcriptional errors may still occur from this process.

## 2023-12-07 ENCOUNTER — Encounter: Payer: Self-pay | Admitting: Student in an Organized Health Care Education/Training Program

## 2023-12-07 DIAGNOSIS — R911 Solitary pulmonary nodule: Secondary | ICD-10-CM

## 2023-12-09 ENCOUNTER — Other Ambulatory Visit: Payer: Self-pay | Admitting: Primary Care

## 2023-12-09 ENCOUNTER — Ambulatory Visit

## 2023-12-09 ENCOUNTER — Ambulatory Visit: Payer: Managed Care, Other (non HMO) | Admitting: Cardiovascular Disease

## 2023-12-09 DIAGNOSIS — E222 Syndrome of inappropriate secretion of antidiuretic hormone: Secondary | ICD-10-CM

## 2023-12-09 DIAGNOSIS — R600 Localized edema: Secondary | ICD-10-CM

## 2023-12-10 ENCOUNTER — Ambulatory Visit
Admission: RE | Admit: 2023-12-10 | Source: Ambulatory Visit | Admitting: Student in an Organized Health Care Education/Training Program

## 2023-12-10 ENCOUNTER — Encounter: Admission: RE | Payer: Self-pay | Source: Ambulatory Visit

## 2023-12-10 ENCOUNTER — Telehealth: Payer: Self-pay

## 2023-12-10 DIAGNOSIS — R911 Solitary pulmonary nodule: Secondary | ICD-10-CM | POA: Insufficient documentation

## 2023-12-10 HISTORY — DX: Nonrheumatic aortic (valve) stenosis: I35.0

## 2023-12-10 HISTORY — DX: Wedge compression fracture of unspecified lumbar vertebra, initial encounter for closed fracture: S32.000A

## 2023-12-10 HISTORY — DX: Other ill-defined heart diseases: I51.89

## 2023-12-10 HISTORY — DX: Syndrome of inappropriate secretion of antidiuretic hormone: E22.2

## 2023-12-10 HISTORY — DX: Solitary pulmonary nodule: R91.1

## 2023-12-10 HISTORY — DX: Long term (current) use of aspirin: Z79.82

## 2023-12-10 HISTORY — DX: Peripheral vascular disease, unspecified: I73.9

## 2023-12-10 HISTORY — DX: Hypo-osmolality and hyponatremia: E87.1

## 2023-12-10 HISTORY — DX: Hyperlipidemia, unspecified: E78.5

## 2023-12-10 HISTORY — DX: Disorder of arteries and arterioles, unspecified: I77.9

## 2023-12-10 SURGERY — BRONCHOSCOPY, WITH BIOPSY USING ELECTROMAGNETIC NAVIGATION
Anesthesia: General | Laterality: Bilateral

## 2023-12-10 MED ORDER — SODIUM CHLORIDE 1 G PO TABS: 1.0000 g | ORAL_TABLET | Freq: Two times a day (BID) | ORAL | 0 refills | Status: DC

## 2023-12-10 NOTE — Progress Notes (Signed)
  Perioperative Services Pre-Admission/Anesthesia Testing    Date: 12/10/23  Name: Denise [name] MRN:   [id number]  Re: Plans for surgery; cancellation  Planned Surgical Procedure(s):    Case: [id number] Date: 12/10/23   Procedure: ROBOTIC ASSISTED NAVIGATIONAL BRONCHOSCOPY (Bilateral) (canceled)   Anesthesia type: General   Diagnosis: Lung nodule [R91.1]   Pre-op diagnosis: lung nodule   Location: [facility] ORS FOR ANESTHESIA GROUP   Surgeons: Vergia Glasgow, MD      Clinical Notes:  Patient is scheduled for the above procedure on 12/10/2023 with Dr. Alfonza Angry, MD.  Patient called to advise staff that she was being worked up by her PCP for a potential DVT.  For that reason, patient canceling her bronchoscopy today citing the fact that she has too much going on. Patient stated to staff that she feels like she needs to get the clot evaluated and treated prior to proceeding with the bronchoscopy. At this point surgeon and SDS staff are aware.   Patient to contact pulmonology office when she is ready to be rescheduled for her pulmonary procedure.  [name], MSN, APRN, FNP-C, CEN [facility]  Perioperative Services Nurse Practitioner Phone: [phone] Fax: [phone] 12/10/23 1:37 PM  NOTE: This note has been prepared using Dragon dictation software. Despite my best ability to proofread, there is always the potential that unintentional transcriptional errors may still occur from this process.

## 2023-12-10 NOTE — Telephone Encounter (Signed)
 Patient had to cancel procedure on 5/6. Bronch has been moved to 12/19/2023 at 1:00pm at [facility].   The patient is aware of the new date and time.   Denise Armour do you need to get a new authorization?

## 2023-12-10 NOTE — Telephone Encounter (Signed)
 Spoke with patient who stated that due to ongoing health issues, she is not ready to schedule CEA procedure with Dr. Fulton Job.  Patient states she will call when ready.

## 2023-12-11 ENCOUNTER — Telehealth: Payer: Self-pay | Admitting: Cardiovascular Disease

## 2023-12-11 NOTE — Telephone Encounter (Signed)
 Disability form received from [employer].  Patient came in today and signed the release of information and paid the $29 fee.  Form is in Dr. Letta Raw box.

## 2023-12-12 ENCOUNTER — Ambulatory Visit

## 2023-12-13 ENCOUNTER — Ambulatory Visit
Admission: RE | Admit: 2023-12-13 | Discharge: 2023-12-13 | Disposition: A | Discharge status: Home / Self Care | Source: Ambulatory Visit | Attending: Primary Care | Admitting: Primary Care

## 2023-12-13 ENCOUNTER — Ambulatory Visit: Attending: Primary Care

## 2023-12-13 DIAGNOSIS — R600 Localized edema: Secondary | ICD-10-CM | POA: Insufficient documentation

## 2023-12-13 DIAGNOSIS — L539 Erythematous condition, unspecified: Secondary | ICD-10-CM

## 2023-12-13 NOTE — Telephone Encounter (Signed)
 Per Almira Armour, no new authorization needed.  Nothing further needed.

## 2023-12-14 ENCOUNTER — Other Ambulatory Visit: Payer: Self-pay | Admitting: Primary Care

## 2023-12-14 DIAGNOSIS — I10 Essential (primary) hypertension: Secondary | ICD-10-CM

## 2023-12-15 ENCOUNTER — Other Ambulatory Visit: Payer: Self-pay | Admitting: Primary Care

## 2023-12-15 DIAGNOSIS — F419 Anxiety disorder, unspecified: Secondary | ICD-10-CM

## 2023-12-17 ENCOUNTER — Other Ambulatory Visit: Payer: Self-pay

## 2023-12-17 ENCOUNTER — Telehealth: Payer: Self-pay | Admitting: Vascular Surgery

## 2023-12-17 ENCOUNTER — Encounter: Admitting: Thoracic Surgery (Cardiothoracic Vascular Surgery)

## 2023-12-17 ENCOUNTER — Encounter (HOSPITAL_COMMUNITY): Payer: Self-pay | Admitting: Student in an Organized Health Care Education/Training Program

## 2023-12-17 NOTE — Telephone Encounter (Signed)
 [employer] disability forms retrieved from Dr Letta Raw box. Dr Alroy Aspen reviewed her chart and states there's not a reason from a cardiac standpoint, that would qualify her for long term disability. He recommends she speak with a different member of her care team such as PCP or specialist to try and obtain. Called and left patient detailed voicemail explaining this and that she can come back to office for a refund of her $29. Forms placed in completed box for [facility] and she is aware.

## 2023-12-17 NOTE — Progress Notes (Signed)
 PCP - Tretha Fu, NP Cardiologist - Dr Ralene Burger (clearance on 12/06/23)  CT Chest x-ray - 12/05/23 EKG - 10/29/23 Stress Test - n/a ECHO - 10/15/23 Cardiac Cath - n/a  ICD Pacemaker/Loop - n/a  Sleep Study -  n/a  Diabetes - n/a  Blood Thinner Instructions:  n/a  Aspirin  Instructions: Continue per MD but none on DOS per patient.  NPO  Anesthesia review: Yes  STOP now taking any Aspirin  (unless otherwise instructed by your surgeon), Aleve, Naproxen, Ibuprofen, Motrin, Advil, Goody's, BC's, all herbal medications, fish oil, and all vitamins.   Coronavirus Screening Do you have any of the following symptoms:  Cough occasional Fever (>100.61F)  yes/no: No Runny nose occasional r/t allergies Sore throat yes/no: No Difficulty breathing/shortness of breath  occasional-COPD  Have you traveled in the last 14 days and where? yes/no: No  Patient verbalized understanding of instructions that were given via phone.

## 2023-12-17 NOTE — Telephone Encounter (Signed)
 Patient is aware that she should come back to get her $29 cash forms payment.  I will hold money for her in the safe.  If she does not come back, I will apply the money to her outstanding [facility] balance due.

## 2023-12-18 NOTE — Anesthesia Preprocedure Evaluation (Signed)
 Anesthesia Evaluation  Patient identified by MRN, date of birth, ID band Patient awake    Reviewed: Allergy & Precautions, NPO status , Patient's Chart, lab work & pertinent test results  Airway Mallampati: II  TM Distance: >3 FB Neck ROM: Full    Dental  (+) Dental Advisory Given, Lower Dentures, Upper Dentures   Pulmonary COPD,  COPD inhaler, Patient abstained from smoking., former smoker   Pulmonary exam normal breath sounds clear to auscultation       Cardiovascular hypertension, Pt. on medications + CAD and + Peripheral Vascular Disease  Normal cardiovascular exam+ Valvular Problems/Murmurs (moderate AS) AS  Rhythm:Regular Rate:Normal     Neuro/Psych  Headaches PSYCHIATRIC DISORDERS Anxiety Depression    SIADH     GI/Hepatic Neg liver ROS,GERD  Medicated and Controlled,,  Endo/Other  negative endocrine ROS    Renal/GU negative Renal ROS     Musculoskeletal  (+) Arthritis ,    Abdominal   Peds  Hematology negative hematology ROS (+)   Anesthesia Other Findings Day of surgery medications reviewed with the patient.  Reproductive/Obstetrics                              Anesthesia Physical Anesthesia Plan  ASA: 4  Anesthesia Plan: General   Post-op Pain Management: Tylenol  PO (pre-op)*   Induction: Intravenous  PONV Risk Score and Plan: 3 and Midazolam , Dexamethasone and Ondansetron   Airway Management Planned: Oral ETT  Additional Equipment:   Intra-op Plan:   Post-operative Plan: Extubation in OR  Informed Consent: I have reviewed the patients History and Physical, chart, labs and discussed the procedure including the risks, benefits and alternatives for the proposed anesthesia with the patient or authorized representative who has indicated his/her understanding and acceptance.     Dental advisory given  Plan Discussed with: CRNA  Anesthesia Plan Comments: (Pt  previously scheduled at [facility] See PAT note by [name], NP on 12/06/23.)         Anesthesia Quick Evaluation

## 2023-12-19 ENCOUNTER — Ambulatory Visit (HOSPITAL_BASED_OUTPATIENT_CLINIC_OR_DEPARTMENT_OTHER): Payer: Self-pay | Admitting: Anesthesiology

## 2023-12-19 ENCOUNTER — Ambulatory Visit (HOSPITAL_COMMUNITY): Payer: Self-pay | Admitting: Physician Assistant

## 2023-12-19 ENCOUNTER — Encounter (HOSPITAL_COMMUNITY): Payer: Self-pay | Admitting: Student in an Organized Health Care Education/Training Program

## 2023-12-19 ENCOUNTER — Encounter (HOSPITAL_COMMUNITY)
Admission: RE | Disposition: A | Discharge status: Home / Self Care | Payer: Self-pay | Source: Home / Self Care | Attending: Student in an Organized Health Care Education/Training Program

## 2023-12-19 ENCOUNTER — Ambulatory Visit (HOSPITAL_COMMUNITY)
Admission: RE | Admit: 2023-12-19 | Discharge: 2023-12-19 | Disposition: A | Discharge status: Home / Self Care | Attending: Student in an Organized Health Care Education/Training Program | Admitting: Student in an Organized Health Care Education/Training Program

## 2023-12-19 ENCOUNTER — Ambulatory Visit: Payer: Self-pay | Admitting: Primary Care

## 2023-12-19 ENCOUNTER — Telehealth: Payer: Self-pay | Admitting: Student in an Organized Health Care Education/Training Program

## 2023-12-19 ENCOUNTER — Other Ambulatory Visit: Payer: Self-pay

## 2023-12-19 ENCOUNTER — Ambulatory Visit (HOSPITAL_COMMUNITY)

## 2023-12-19 DIAGNOSIS — I10 Essential (primary) hypertension: Secondary | ICD-10-CM | POA: Insufficient documentation

## 2023-12-19 DIAGNOSIS — R911 Solitary pulmonary nodule: Secondary | ICD-10-CM | POA: Diagnosis present

## 2023-12-19 DIAGNOSIS — K219 Gastro-esophageal reflux disease without esophagitis: Secondary | ICD-10-CM | POA: Insufficient documentation

## 2023-12-19 DIAGNOSIS — I739 Peripheral vascular disease, unspecified: Secondary | ICD-10-CM | POA: Diagnosis not present

## 2023-12-19 DIAGNOSIS — J449 Chronic obstructive pulmonary disease, unspecified: Secondary | ICD-10-CM | POA: Diagnosis not present

## 2023-12-19 DIAGNOSIS — F419 Anxiety disorder, unspecified: Secondary | ICD-10-CM | POA: Diagnosis not present

## 2023-12-19 DIAGNOSIS — Z87891 Personal history of nicotine dependence: Secondary | ICD-10-CM | POA: Insufficient documentation

## 2023-12-19 DIAGNOSIS — F32A Depression, unspecified: Secondary | ICD-10-CM | POA: Diagnosis not present

## 2023-12-19 DIAGNOSIS — Z7951 Long term (current) use of inhaled steroids: Secondary | ICD-10-CM | POA: Diagnosis not present

## 2023-12-19 DIAGNOSIS — I2510 Atherosclerotic heart disease of native coronary artery without angina pectoris: Secondary | ICD-10-CM

## 2023-12-19 DIAGNOSIS — J432 Centrilobular emphysema: Secondary | ICD-10-CM | POA: Insufficient documentation

## 2023-12-19 DIAGNOSIS — R918 Other nonspecific abnormal finding of lung field: Secondary | ICD-10-CM | POA: Diagnosis not present

## 2023-12-19 HISTORY — PX: BRONCHOSCOPY, WITH BIOPSY USING ELECTROMAGNETIC NAVIGATION: SHX7536

## 2023-12-19 HISTORY — PX: VIDEO BRONCHOSCOPY WITH RADIAL ENDOBRONCHIAL ULTRASOUND: SHX6849

## 2023-12-19 HISTORY — PX: BRONCHIAL BRUSHINGS: SHX5108

## 2023-12-19 HISTORY — PX: BRONCHIAL WASHINGS: SHX5105

## 2023-12-19 HISTORY — PX: VIDEO BRONCHOSCOPY WITH ENDOBRONCHIAL ULTRASOUND: SHX6177

## 2023-12-19 HISTORY — PX: BRONCHIAL BIOPSY: SHX5109

## 2023-12-19 HISTORY — PX: BRONCHIAL NEEDLE ASPIRATION BIOPSY: SHX5106

## 2023-12-19 SURGERY — BRONCHOSCOPY, WITH BIOPSY USING ELECTROMAGNETIC NAVIGATION
Anesthesia: General | Laterality: Bilateral

## 2023-12-19 MED ORDER — IPRATROPIUM-ALBUTEROL 0.5-2.5 (3) MG/3ML IN SOLN
RESPIRATORY_TRACT | Status: AC
  Filled 2023-12-19: qty 3

## 2023-12-19 MED ORDER — CHLORHEXIDINE GLUCONATE 0.12 % MT SOLN: 15.0000 mL | Freq: Once | OROMUCOSAL | Status: AC

## 2023-12-19 MED ORDER — MIDAZOLAM HCL 2 MG/2ML IJ SOLN
INTRAMUSCULAR | Status: AC
  Filled 2023-12-19: qty 2

## 2023-12-19 MED ORDER — CHLORHEXIDINE GLUCONATE 0.12 % MT SOLN
OROMUCOSAL | Status: AC
  Administered 2023-12-19: 15 mL via OROMUCOSAL
  Filled 2023-12-19: qty 15

## 2023-12-19 MED ORDER — PHENYLEPHRINE 80 MCG/ML (10ML) SYRINGE FOR IV PUSH (FOR BLOOD PRESSURE SUPPORT)
PREFILLED_SYRINGE | INTRAVENOUS | Status: DC | PRN
  Administered 2023-12-19: 80 ug via INTRAVENOUS

## 2023-12-19 MED ORDER — PROPOFOL 500 MG/50ML IV EMUL
INTRAVENOUS | Status: DC | PRN
  Administered 2023-12-19: 100 ug/kg/min via INTRAVENOUS

## 2023-12-19 MED ORDER — IPRATROPIUM-ALBUTEROL 0.5-2.5 (3) MG/3ML IN SOLN
3.0000 mL | Freq: Once | RESPIRATORY_TRACT | Status: AC
  Administered 2023-12-19: 3 mL via RESPIRATORY_TRACT

## 2023-12-19 MED ORDER — MIDAZOLAM HCL 2 MG/2ML IJ SOLN
INTRAMUSCULAR | Status: DC | PRN
  Administered 2023-12-19: 2 mg via INTRAVENOUS

## 2023-12-19 MED ORDER — ACETAMINOPHEN 500 MG PO TABS: 1000.0000 mg | ORAL_TABLET | Freq: Once | ORAL | Status: AC

## 2023-12-19 MED ORDER — PHENYLEPHRINE HCL-NACL 20-0.9 MG/250ML-% IV SOLN
INTRAVENOUS | Status: DC | PRN
  Administered 2023-12-19: 20 ug/min via INTRAVENOUS

## 2023-12-19 MED ORDER — LACTATED RINGERS IV SOLN: INTRAVENOUS | Status: DC

## 2023-12-19 MED ORDER — ORAL CARE MOUTH RINSE: 15.0000 mL | Freq: Once | OROMUCOSAL | Status: AC

## 2023-12-19 MED ORDER — ONDANSETRON HCL 4 MG/2ML IJ SOLN: 4.0000 mg | Freq: Once | INTRAMUSCULAR | Status: DC | PRN

## 2023-12-19 MED ORDER — LIDOCAINE 2% (20 MG/ML) 5 ML SYRINGE
INTRAMUSCULAR | Status: DC | PRN
  Administered 2023-12-19: 100 mg via INTRAVENOUS

## 2023-12-19 MED ORDER — FENTANYL CITRATE (PF) 100 MCG/2ML IJ SOLN: 25.0000 ug | INTRAMUSCULAR | Status: DC | PRN

## 2023-12-19 MED ORDER — FENTANYL CITRATE (PF) 100 MCG/2ML IJ SOLN
INTRAMUSCULAR | Status: AC
  Filled 2023-12-19: qty 2

## 2023-12-19 MED ORDER — PROPOFOL 10 MG/ML IV BOLUS
INTRAVENOUS | Status: DC | PRN
  Administered 2023-12-19: 100 mg via INTRAVENOUS

## 2023-12-19 MED ORDER — DEXAMETHASONE SODIUM PHOSPHATE 10 MG/ML IJ SOLN
INTRAMUSCULAR | Status: DC | PRN
  Administered 2023-12-19: 10 mg via INTRAVENOUS

## 2023-12-19 MED ORDER — ONDANSETRON HCL 4 MG/2ML IJ SOLN
INTRAMUSCULAR | Status: DC | PRN
  Administered 2023-12-19: 4 mg via INTRAVENOUS

## 2023-12-19 MED ORDER — FENTANYL CITRATE (PF) 250 MCG/5ML IJ SOLN
INTRAMUSCULAR | Status: DC | PRN
  Administered 2023-12-19: 50 ug via INTRAVENOUS
  Administered 2023-12-19: 25 ug via INTRAVENOUS

## 2023-12-19 MED ORDER — ROCURONIUM BROMIDE 10 MG/ML (PF) SYRINGE
PREFILLED_SYRINGE | INTRAVENOUS | Status: DC | PRN
  Administered 2023-12-19: 10 mg via INTRAVENOUS
  Administered 2023-12-19: 50 mg via INTRAVENOUS

## 2023-12-19 MED ORDER — SODIUM CHLORIDE (PF) 0.9 % IJ SOLN
PREFILLED_SYRINGE | INTRAMUSCULAR | Status: DC | PRN
  Administered 2023-12-19: 2 mL

## 2023-12-19 MED ORDER — ACETAMINOPHEN 500 MG PO TABS
ORAL_TABLET | ORAL | Status: AC
Start: 2023-12-19 — End: 2023-12-19
  Administered 2023-12-19: 1000 mg via ORAL
  Filled 2023-12-19: qty 2

## 2023-12-19 MED ORDER — ESMOLOL HCL 100 MG/10ML IV SOLN
INTRAVENOUS | Status: DC | PRN
  Administered 2023-12-19: 30 ug via INTRAVENOUS
  Administered 2023-12-19: 20 ug via INTRAVENOUS

## 2023-12-19 MED ORDER — SUGAMMADEX SODIUM 200 MG/2ML IV SOLN
INTRAVENOUS | Status: DC | PRN
  Administered 2023-12-19: 200 mg via INTRAVENOUS

## 2023-12-19 NOTE — Interval H&P Note (Signed)
 S: feels at baseline, cough/wheeze/SOB unchanged compared to prior. No chest pain.  O: Vitals:   12/19/23 1031  BP: 118/82  Pulse: 95  Resp: 18  Temp: 97.9 F (36.6 C)  SpO2: 92%   General: Well-appearing and in no distress. Well-nourished HEENT: Mucous membranes moist, no evidence of postnasal drip Respiratory: Trachea is midline, no respiratory distress, decreased air entry bilaterally Cardiovascular: Heart with regular rate and rhythm, normal S1 and S2 Neuro: Alert and oriented, no gross focal deficits  A/P: 63 year old female with history of a LLL pulmonary nodule that had shown interval growth on chest CT but without FDG avidity on PET. Given growth from 10 to 15 mm, we are recommending robotic assisted navigational bronchoscopy for tissue acquisition to establish a diagnosis and rule out malignancy. Patient will also undergo EBUS to stage the mediastinum. Risks and benefits discussed and she is appropriate for the procedure.  Vergia Glasgow, MD [facility] 12/19/2023 12:06 PM

## 2023-12-19 NOTE — Telephone Encounter (Signed)
 Received long term disability paperwork today. Put in Dr. Para Bold folder to fill out.

## 2023-12-19 NOTE — Op Note (Addendum)
 Video Bronchoscopy with Robotic Assisted Bronchoscopic Navigation   Date of Operation: 12/19/2023   Pre-op Diagnosis: lung nodule  Surgeon: Vergia Glasgow, MD  Anesthesia: General endotracheal anesthesia  Operation: Flexible video fiberoptic bronchoscopy with robotic assistance and biopsies.  Estimated Blood Loss: Minimal  Complications: None  Indications and History: Denise [name] is a 63 y.o. female with history of emphysema who presents with a LLL pulmonary nodule for biopsy.  Recommendation made to achieve a tissue diagnosis via robotic assisted navigational bronchoscopy.  The risks, benefits, complications, treatment options and expected outcomes were discussed with the patient.  The possibilities of pneumothorax, pneumonia, reaction to medication, pulmonary aspiration, perforation of a viscus, bleeding, failure to diagnose a condition and creating a complication requiring transfusion or operation were discussed with the patient who freely signed the consent.    Description of Procedure: The patient was seen in the Preoperative Area, was examined and was deemed appropriate to proceed.  The patient was taken to [facility] Endoscopy room 3, identified as Denise [name] and the procedure verified as Flexible Video Fiberoptic Bronchoscopy.  A Time Out was held and the above information confirmed.   Prior to the date of the procedure a high-resolution CT scan of the chest was performed. Utilizing ION software program a virtual tracheobronchial tree was generated to allow the creation of distinct navigation pathways to the patient's parenchymal abnormalities. After being taken to the operating room general anesthesia was initiated and the patient  was orally intubated. The video fiberoptic bronchoscope was introduced via the endotracheal tube and a general inspection was performed which showed normal right and left lung anatomy. Aspiration of the bilateral mainstems was completed to remove any  remaining secretions. Robotic catheter inserted into patient's endotracheal tube.   Target #1 LLL nodule: The distinct navigation pathways prepared prior to this procedure were then utilized to navigate to patient's lesion identified on CT scan. The robotic catheter was secured into place and the vision probe was withdrawn.  Lesion location was approximated using fluoroscopy.  Local registration and targeting was performed using Siem[employer] Cios mobile C-arm three-dimensional imaging. We also utilized the radial ebus to attempt to localize the nodule and adjust the position of our catheter. Under fluoroscopic guidance transbronchial brushings, transbronchial needle biopsies, and transbronchial forceps biopsies were performed to be sent for cytology.  Needle-in-lesion was confirmed using Cios mobile C-arm.  A bronchioalveolar lavage was performed in the LLL and sent for microbiology.  We then introduced the EBUS bronchoscope and staged the mediastinum. There was a minimally enlarged lymph node at both stations 4R and station 7. Both lymph nodes were biopsied with a 21G Olympus ViziShot 2 needle and sent for slide any cytology.  At the end of the procedure a general airway inspection was performed and there was no evidence of active bleeding. The bronchoscope was removed.  The patient tolerated the procedure well. There was no significant blood loss and there were no obvious complications. A post-procedural chest x-ray is pending.  Samples Target #1: LLL nodule 1. Transbronchial brushings from LLL nodule 2. Transbronchial needle biopsies from LLL nodule 3. Transbronchial forceps biopsies from LLL nodule 4. Bronchoalveolar lavage from LLL superior segment  Tool in lesion:    Samples Target #2: EBUS to stations 4R and 7  Plans:  The patient will be discharged from the PACU to home when recovered from anesthesia and after chest x-ray is reviewed. We will review the cytology, pathology and  microbiology results with the patient when  they become available. Outpatient followup will be with me.  Vergia Glasgow, MD [facility]

## 2023-12-19 NOTE — Transfer of Care (Signed)
 Immediate Anesthesia Transfer of Care Note  Patient: Denise [name]  Procedure(s) Performed: ROBOTIC ASSISTED NAVIGATIONAL BRONCHOSCOPY (Bilateral) BRONCHOSCOPY, WITH EBUS VIDEO BRONCHOSCOPY WITH RADIAL ENDOBRONCHIAL ULTRASOUND BRONCHOSCOPY, WITH NEEDLE ASPIRATION BIOPSY BRONCHOSCOPY, WITH BRUSH BIOPSY BRONCHOSCOPY, WITH BIOPSY  Patient Location: PACU  Anesthesia Type:General  Level of Consciousness: awake and alert   Airway & Oxygen Therapy: Patient Spontanous Breathing and Patient connected to nasal cannula oxygen  Post-op Assessment: Report given to RN and Post -op Vital signs reviewed and stable  Post vital signs: Reviewed and stable  Last Vitals:  Vitals Value Taken Time  BP 124/92 12/19/23 1334  Temp    Pulse 87 12/19/23 1339  Resp 18 12/19/23 1339  SpO2 89 % 12/19/23 1339  Vitals shown include unfiled device data.  Last Pain:  Vitals:   12/19/23 1042  TempSrc:   PainSc: 0-No pain         Complications: No notable events documented.

## 2023-12-19 NOTE — Anesthesia Procedure Notes (Signed)
 Procedure Name: Intubation Date/Time: 12/19/2023 12:19 PM  Performed by: Lorrayne Rosier, RNPre-anesthesia Checklist: Patient identified, Emergency Drugs available, Suction available and Patient being monitored Patient Re-evaluated:Patient Re-evaluated prior to induction Oxygen Delivery Method: Circle system utilized Preoxygenation: Pre-oxygenation with 100% oxygen Induction Type: IV induction Ventilation: Two handed mask ventilation required Laryngoscope Size: Mac and 3 Grade View: Grade I Tube type: Oral Tube size: 8.5 mm Number of attempts: 1 Airway Equipment and Method: Stylet and Oral airway Placement Confirmation: ETT inserted through vocal cords under direct vision, positive ETCO2 and breath sounds checked- equal and bilateral Secured at: 21 cm Tube secured with: Tape Dental Injury: Teeth and Oropharynx as per pre-operative assessment

## 2023-12-20 ENCOUNTER — Encounter (HOSPITAL_COMMUNITY): Payer: Self-pay | Admitting: Student in an Organized Health Care Education/Training Program

## 2023-12-20 ENCOUNTER — Ambulatory Visit: Payer: Self-pay | Admitting: Student in an Organized Health Care Education/Training Program

## 2023-12-20 LAB — CULTURE, BAL-QUANTITATIVE W GRAM STAIN
Culture: >=100000 — AB
Gram Stain: NONE SEEN

## 2023-12-20 MED ORDER — CEFPODOXIME PROXETIL 200 MG PO TABS
200.0000 mg | ORAL_TABLET | Freq: Two times a day (BID) | ORAL | 0 refills | Status: DC
Start: 2023-12-20 — End: 2023-12-26

## 2023-12-20 NOTE — Anesthesia Postprocedure Evaluation (Signed)
 Anesthesia Post Note  Patient: Shaniqa Mcgeorge  Procedure(s) Performed: ROBOTIC ASSISTED NAVIGATIONAL BRONCHOSCOPY (Bilateral) BRONCHOSCOPY, WITH EBUS VIDEO BRONCHOSCOPY WITH RADIAL ENDOBRONCHIAL ULTRASOUND BRONCHOSCOPY, WITH NEEDLE ASPIRATION BIOPSY BRONCHOSCOPY, WITH BRUSH BIOPSY BRONCHOSCOPY, WITH BIOPSY IRRIGATION, BRONCHUS     Patient location during evaluation: PACU Anesthesia Type: General Level of consciousness: awake and alert Pain management: pain level controlled Vital Signs Assessment: post-procedure vital signs reviewed and stable Respiratory status: spontaneous breathing, nonlabored ventilation and respiratory function stable Cardiovascular status: blood pressure returned to baseline and stable Postop Assessment: no apparent nausea or vomiting Anesthetic complications: no   No notable events documented.  Last Vitals:  Vitals:   12/19/23 1415 12/19/23 1430  BP: (!) 95/57 (!) 97/53  Pulse: 84 87  Resp: 17 17  Temp:  37.1 C  SpO2: 98% 92%    Last Pain:  Vitals:   12/19/23 1430  TempSrc:   PainSc: 0-No pain                 Erin Havers

## 2023-12-21 LAB — ACID FAST SMEAR (AFB, MYCOBACTERIA): Acid Fast Smear: NEGATIVE

## 2023-12-23 LAB — CYTOLOGY - NON PAP

## 2023-12-24 LAB — AEROBIC/ANAEROBIC CULTURE W GRAM STAIN (SURGICAL/DEEP WOUND): Gram Stain: NONE SEEN

## 2023-12-26 ENCOUNTER — Ambulatory Visit (INDEPENDENT_AMBULATORY_CARE_PROVIDER_SITE_OTHER): Admitting: Student in an Organized Health Care Education/Training Program

## 2023-12-26 VITALS — BP 132/64 | HR 93 | Ht 63.0 in | Wt 167.0 lb

## 2023-12-26 DIAGNOSIS — R911 Solitary pulmonary nodule: Secondary | ICD-10-CM | POA: Diagnosis not present

## 2023-12-26 DIAGNOSIS — Z87891 Personal history of nicotine dependence: Secondary | ICD-10-CM

## 2023-12-26 DIAGNOSIS — J432 Centrilobular emphysema: Secondary | ICD-10-CM | POA: Diagnosis not present

## 2023-12-26 MED ORDER — INCRUSE ELLIPTA 62.5 MCG/ACT IN AEPB: 1.0000 | INHALATION_SPRAY | Freq: Every day | RESPIRATORY_TRACT | 12 refills | Status: DC

## 2023-12-26 NOTE — Progress Notes (Signed)
 Assessment & Plan:   # Gold stage IIb COPD # Emphysema  Patient carries a diagnosis of emphysema noted on her chest CTs in addition to having history of smoking.  She was maintained on ICS/LABA and has had her pulmonary function test that shows a ratio of 0.62 with FEV1 of 1.29 L (53% predicted.  DLCO was significantly reduced at 23% predicted.  This is overall consistent with Gold stage IIB COPD which I will add LAMA therapy.  - umeclidinium bromide (INCRUSE ELLIPTA) 62.5 MCG/ACT AEPB; Inhale 1 puff into the lungs daily.  Dispense: 30 each; Refill: 12 - Continue Wixela 1 puff twice daily  # Lung nodule  Patient presenting for follow-up of her left lower lobe pulmonary nodule measuring 15 mm which was present over the past few years.  She is now status post robotic assisted navigational bronchoscopy with biopsy (and tool in lesion confirmation) not showing any malignant cells.  There is a question of specimen adequacy which makes it difficult to interpret this result as a definitive answer.  EBUS to stations 7 and 4R was negative for any malignancy.  Given all of this, I will continue to radiographically monitor this nodule with a PET CT to be performed at the 44-month mark.  I will see her for follow-up after that, and should there be FDG avidity or growth of the nodule, we will proceed with repeat biopsy.  - NM PET Image Initial (PI) Skull Base To Thigh (F-18 FDG); Future   Return in about 3 months (around 03/27/2024).  I spent 30 minutes caring for this patient today, including preparing to see the patient, obtaining a medical history , reviewing a separately obtained history, performing a medically appropriate examination and/or evaluation, counseling and educating the patient/family/caregiver, ordering medications, tests, or procedures, documenting clinical information in the electronic health record, and independently interpreting results (not separately reported/billed) and  communicating results to the patient/family/caregiver  Vergia Glasgow, MD [facility]   End of visit medications:  Meds ordered this encounter  Medications   umeclidinium bromide (INCRUSE ELLIPTA) 62.5 MCG/ACT AEPB    Sig: Inhale 1 puff into the lungs daily.    Dispense:  30 each    Refill:  12     Current Outpatient Medications:    acetaminophen  (TYLENOL ) 500 MG tablet, Take 2 tablets (1,000 mg total) by mouth every 8 (eight) hours as needed., Disp: , Rfl:    albuterol  (VENTOLIN  HFA) 108 (90 Base) MCG/ACT inhaler, INHALE 2 PUFFS INTO THE LUNGS EVERY 4-6 HOURS AS NEEDED FOR TIGHTNESS/WHEEZING (Patient taking differently: Inhale 2 puffs into the lungs every 6 (six) hours as needed for wheezing or shortness of breath.), Disp: 8.5 each, Rfl: 0   amLODipine  (NORVASC ) 10 MG tablet, TAKE 1 TABLET BY MOUTH EVERY DAY FOR BLOOD PRESSURE, Disp: 90 tablet, Rfl: 0   Ascorbic Acid (VITAMIN C) 1000 MG tablet, Take 1,000 mg by mouth daily., Disp: , Rfl:    aspirin  EC 81 MG tablet, Take 162 mg by mouth daily., Disp: , Rfl:    atorvastatin  (LIPITOR ) 80 MG tablet, Take 1 tablet (80 mg total) by mouth daily. for cholesterol., Disp: 90 tablet, Rfl: 3   diphenhydrAMINE (BENADRYL) 25 MG tablet, Take 25 mg by mouth every 6 (six) hours as needed for allergies., Disp: , Rfl:    doxycycline  (VIBRA -TABS) 100 MG tablet, Take 1 tablet (100 mg total) by mouth 2 (two) times daily., Disp: 14 tablet, Rfl: 0   escitalopram  (LEXAPRO )  20 MG tablet, TAKE 1 TABLET BY MOUTH DAILY FOR ANXIETY, Disp: 90 tablet, Rfl: 1   famotidine  (PEPCID ) 20 MG tablet, Take 20 mg by mouth daily as needed for heartburn or indigestion., Disp: , Rfl:    ferrous sulfate  325 (65 FE) MG tablet, Take 1 tablet (325 mg total) by mouth daily with breakfast., Disp: 60 tablet, Rfl: 0   fluticasone -salmeterol (WIXELA INHUB) 250-50 MCG/ACT AEPB, INHALE 1 PUFF INTO THE LUNGS IN THE MORNING AND AT BEDTIME., Disp: 180 each, Rfl: 1    furosemide  (LASIX ) 20 MG tablet, TAKE 1-2 TABLETS BY MOUTH ONCE DAILY FOR LEG SWELLING., Disp: 30 tablet, Rfl: 0   methocarbamol  (ROBAXIN ) 500 MG tablet, Take 1 tablet (500 mg total) by mouth every 8 (eight) hours as needed for muscle spasms., Disp: 60 tablet, Rfl: 0   pyridOXINE (VITAMIN B-6) 25 MG tablet, Take 25 mg by mouth daily., Disp: , Rfl:    sodium chloride  1 g tablet, Take 1 tablet (1 g total) by mouth 2 (two) times daily with a meal. To increase sodium, Disp: 60 tablet, Rfl: 0   traMADol  (ULTRAM ) 50 MG tablet, Take 2 tablets (100 mg total) by mouth every 12 (twelve) hours as needed for severe pain (pain score 7-10)., Disp: 60 tablet, Rfl: 0   umeclidinium bromide (INCRUSE ELLIPTA) 62.5 MCG/ACT AEPB, Inhale 1 puff into the lungs daily., Disp: 30 each, Rfl: 12   vitamin B-12 (CYANOCOBALAMIN ) 1000 MCG tablet, Take 1,000 mcg by mouth daily., Disp: , Rfl:    Vitamin D , Ergocalciferol , (DRISDOL ) 1.25 MG (50000 UNIT) CAPS capsule, Take 1 capsule (50,000 Units total) by mouth every 7 (seven) days. (Patient taking differently: Take 50,000 Units by mouth every 7 (seven) days. Takes on Sat), Disp: 7 capsule, Rfl: 0   vitamin E 180 MG (400 UNITS) capsule, Take 800 Units by mouth daily., Disp: , Rfl:    Subjective:   PATIENT ID: Denise [name] GENDER: female DOB: [birth year], MRN: [id number]  Chief Complaint  Patient presents with   Medical Management of Chronic Issues    HPI  Pleasant 63 year old female presenting to clinic for follow up.   She was recently admitted to the hospital with traumatic pneumothorax. She reported exertional dyspnea that is chronic, and is currently maintained on ICS/LABA with Wixela. She is reporting an occasional cough. No weight changes are reported.   In 2024, she was seen by Dr. Fulton Job from vascular surgery for evaluation of carotid artery disease and was being worked up for carotid endarterectomy. She was supposed to get her endarterectomy in March of 2025 but  she fell a week prior to that resulting in rib fractures and a traumatic pneumothorax. A chest tube was placed and she was hospitalized for a week. Chest CT noted severe subcutaneous emphysema as well as the left lower lobe pulmonary nodule.   Patient was first noted to have this pulmonary nodule in 2021 where it measured 10 x 10 mm. She had a repeat scan a year later showing the nodule to be stable. She also had a PET/CT in 2022 that did not show it to be FDG avid. She then underwent a coronary CT which again noted the pulmonary nodule, showing it to have grown compared to 2022. She was referred to pulmonary by Dr. Alroy Aspen for evaluation of said nodule.  She underwent robotic assisted navigational bronchoscopy with biopsy of the left lower lobe pulmonary nodule on 12/19/2023.  The report from the pathologist indicates nondiagnostic material.  No malignant  cells were noted.  EBUS to station 7 and 4R was negative for malignancy.  BAL was sent for culture and returned positive for haemophilus influenza for which we treated her with a course of cefpodoxime .  Today, she feels much better with improvement in her shortness of breath as well as her cough.   Patient reports having smoked from the age of 35, quit in 2024.  She has around 38 pack years of smoking history.  No occupational exposures reported.  Ancillary information including prior medications, full medical/surgical/family/social histories, and PFTs (when available) are listed below and have been reviewed.   Review of Systems  Constitutional:  Negative for chills, fever and weight loss.  Respiratory:  Positive for shortness of breath. Negative for cough, hemoptysis, sputum production and wheezing.   Cardiovascular:  Negative for chest pain.     Objective:   Vitals:   12/26/23 1047  BP: 132/64  Pulse: 93  SpO2: 93%  Weight: 167 lb (75.8 kg)  Height: 5\' 3"  (1.6 m)   93% on RA BMI Readings from Last 3 Encounters:  12/26/23 29.58 kg/m   12/19/23 30.82 kg/m  12/04/23 31.35 kg/m   Wt Readings from Last 3 Encounters:  12/26/23 167 lb (75.8 kg)  12/19/23 174 lb (78.9 kg)  12/04/23 177 lb (80.3 kg)    Physical Exam Constitutional:      Appearance: Normal appearance.  Cardiovascular:     Rate and Rhythm: Normal rate and regular rhythm.     Pulses: Normal pulses.     Heart sounds: Normal heart sounds.  Pulmonary:     Effort: Pulmonary effort is normal.     Breath sounds: No wheezing or rales.     Comments: Decreased breath sounds bilaterally Neurological:     General: No focal deficit present.     Mental Status: She is alert. Mental status is at baseline.       Ancillary Information    Past Medical History:  Diagnosis Date   Allergy    Anemia 12/24/2019   Anxiety    Aortic stenosis    Arthritis    Carotid artery disease (HCC)    a.) doppler 04/23/2023: 70-99% LEFT carotid bifurcation   Centrilobular emphysema (HCC) 11/2023   Chickenpox    Cholecystitis with cholelithiasis 03/31/2012   Closed fracture of L4 transverse process s/p traumatic mechanical fall 10/26/2023   COPD (chronic obstructive pulmonary disease) (HCC)    Coronary artery disease    Depression    Diastolic dysfunction    Dyspnea    with exertion   GERD (gastroesophageal reflux disease)    Headache    Heart murmur    never has caused any problems   High anion gap metabolic acidosis 10/26/2023   History of blood transfusion 2021   HLD (hyperlipidemia)    Hypertension 03/31/2012   Hyponatremia in setting of SIADH    Left lower lobe pulmonary nodule 11/2023   Long-term use of aspirin  therapy    Lumbar compression fracture s/p traumatic mechanical fall; L4 superior endplate    Pneumonia    x 2   PVD (peripheral vascular disease) (HCC)    Shingles    SIADH (syndrome of inappropriate ADH production) (HCC)    Traumatic mechanical fall 10/26/2023   a.) s/p traumatic fall 10/26/2023 reuslting in multiple RIGHT rib (5th - 7th)  fractures (required dchest tube), l$ transverse process fracture, L4 superior endplate compression fracture, large volume post-traumatic myofacial/subcutaneous gas collection in chest     Family  History  Problem Relation Age of Onset   Arthritis Mother    Cervical cancer Mother    Heart disease Father    Hypertension Father      Past Surgical History:  Procedure Laterality Date   BIOPSY  12/24/2019   Procedure: BIOPSY;  Surgeon: Janel Medford, MD;  Location[facility] ENDOSCOPY;  Service: Endoscopy;;   BRONCHIAL BIOPSY  12/19/2023   Procedure: BRONCHOSCOPY, WITH BIOPSY;  Surgeon: Vergia Glasgow, MD;  Location: [facility] ENDOSCOPY;  Service: Pulmonary;;   BRONCHIAL BRUSHINGS  12/19/2023   Procedure: BRONCHOSCOPY, WITH BRUSH BIOPSY;  Surgeon: Vergia Glasgow, MD;  Location: [facility] ENDOSCOPY;  Service: Pulmonary;;   BRONCHIAL NEEDLE ASPIRATION BIOPSY  12/19/2023   Procedure: BRONCHOSCOPY, WITH NEEDLE ASPIRATION BIOPSY;  Surgeon: Vergia Glasgow, MD;  Location: [facility] ENDOSCOPY;  Service: Pulmonary;;   BRONCHIAL WASHINGS  12/19/2023   Procedure: IRRIGATION, BRONCHUS;  Surgeon: Vergia Glasgow, MD;  Location: [facility] ENDOSCOPY;  Service: Pulmonary;;   BRONCHOSCOPY, WITH BIOPSY USING ELECTROMAGNETIC NAVIGATION Bilateral 12/19/2023   Procedure: ROBOTIC ASSISTED NAVIGATIONAL BRONCHOSCOPY;  Surgeon: Vergia Glasgow, MD;  Location: [facility] ENDOSCOPY;  Service: Pulmonary;  Laterality: Bilateral;   CATARACT EXTRACTION Bilateral 2021   CHOLECYSTECTOMY  03/30/2012   Procedure: LAPAROSCOPIC CHOLECYSTECTOMY WITH INTRAOPERATIVE CHOLANGIOGRAM;  Surgeon: Quitman Bucy, MD;  Location: [facility] ORS;  Service: General;  Laterality: N/A;   COLONOSCOPY WITH PROPOFOL  N/A 12/24/2019   Procedure: COLONOSCOPY WITH PROPOFOL ;  Surgeon: Janel Medford, MD;  Location: [facility] ENDOSCOPY;  Service: Endoscopy;  Laterality: N/A;   ESOPHAGOGASTRODUODENOSCOPY (EGD) WITH PROPOFOL  N/A 12/24/2019   Procedure: ESOPHAGOGASTRODUODENOSCOPY (EGD) WITH PROPOFOL ;  Surgeon:  Janel Medford, MD;  Location: [facility] ENDOSCOPY;  Service: Endoscopy;  Laterality: N/A;   HOT HEMOSTASIS N/A 12/24/2019   Procedure: HOT HEMOSTASIS (ARGON PLASMA COAGULATION/BICAP);  Surgeon: Janel Medford, MD;  Location: [facility] ENDOSCOPY;  Service: Endoscopy;  Laterality: N/A;   POLYPECTOMY  12/24/2019   Procedure: POLYPECTOMY;  Surgeon: Janel Medford, MD;  Location: [facility] ENDOSCOPY;  Service: Endoscopy;;   TUBAL LIGATION  1997   VIDEO BRONCHOSCOPY WITH ENDOBRONCHIAL ULTRASOUND  12/19/2023   Procedure: BRONCHOSCOPY, WITH EBUS;  Surgeon: Vergia Glasgow, MD;  Location: [facility] ENDOSCOPY;  Service: Pulmonary;;   VIDEO BRONCHOSCOPY WITH RADIAL ENDOBRONCHIAL ULTRASOUND  12/19/2023   Procedure: VIDEO BRONCHOSCOPY WITH RADIAL ENDOBRONCHIAL ULTRASOUND;  Surgeon: Vergia Glasgow, MD;  Location: [facility] ENDOSCOPY;  Service: Pulmonary;;    Social History   Socioeconomic History   Marital status: Widowed    Spouse name: Not on file   Number of children: 2   Years of education: Not on file   Highest education level: 12th grade  Occupational History   Not on file  Tobacco Use   Smoking status: Former    Current packs/day: 0.00    Average packs/day: 0.8 packs/day for 38.0 years (28.5 ttl pk-yrs)    Types: Cigarettes    Start date: 05/06/1985    Quit date: 05/07/2023    Years since quitting: 0.6   Smokeless tobacco: Never  Vaping Use   Vaping status: Never Used  Substance and Sexual Activity   Alcohol use: Yes    Alcohol/week: 1.0 standard drink of alcohol    Types: 1 Standard drinks or equivalent per week    Comment: occasionally   Drug use: No   Sexual activity: Not Currently    Birth control/protection: Post-menopausal  Other Topics Concern   Not on file  Social History Narrative   Widow.    2 children.   Works in Financial trader  Service.   Enjoys reading, cooking.    Social Drivers of Corporate investment banker Strain: Low Risk  (11/29/2023)   Overall Financial Resource Strain (CARDIA)    Difficulty  of Paying Living Expenses: Not very hard  Food Insecurity: Patient Declined (11/29/2023)   Hunger Vital Sign    Worried About Running Out of Food in the Last Year: Patient declined    Ran Out of Food in the Last Year: Patient declined  Transportation Needs: Unmet Transportation Needs (11/29/2023)   PRAPARE - Administrator, Civil Service (Medical): No    Lack of Transportation (Non-Medical): Yes  Physical Activity: Inactive (11/29/2023)   Exercise Vital Sign    Days of Exercise per Week: 0 days    Minutes of Exercise per Session: 30 min  Stress: Stress Concern Present (11/29/2023)   [name] of Occupational Health - Occupational Stress Questionnaire    Feeling of Stress : Very much  Social Connections: Unknown (11/29/2023)   Social Connection and Isolation Panel [NHANES]    Frequency of Communication with Friends and Family: More than three times a week    Frequency of Social Gatherings with Friends and Family: Never    Attends Religious Services: Patient declined    Database administrator or Organizations: No    Attends Engineer, structural: Not on file    Marital Status: Widowed  Intimate Partner Violence: Not on file     No Known Allergies   CBC    Component Value Date/Time   WBC 11.2 (H) 12/06/2023 1025   RBC 4.82 12/06/2023 1025   HGB 14.7 12/06/2023 1025   HCT 47.3 (H) 12/06/2023 1025   PLT 360 12/06/2023 1025   MCV 98.1 12/06/2023 1025   MCH 30.5 12/06/2023 1025   MCHC 31.1 12/06/2023 1025   RDW 15.4 12/06/2023 1025   LYMPHSABS 1.0 11/21/2023 1254   MONOABS 0.6 11/21/2023 1254   EOSABS 196 11/29/2023 1516   BASOSABS 69 11/29/2023 1516    Pulmonary Functions Testing Results:    Latest Ref Rng & Units 12/02/2023   10:34 AM  PFT Results  FVC-Pre L 2.64   FVC-Predicted Pre % 84   FVC-Post L 2.66   FVC-Predicted Post % 84   Pre FEV1/FVC % % 48   Post FEV1/FCV % % 48   FEV1-Pre L 1.26   FEV1-Predicted Pre % 52   FEV1-Post L 1.29    DLCO uncorrected ml/min/mmHg 4.57   DLCO UNC% % 23   DLCO corrected ml/min/mmHg 4.50   DLCO COR %Predicted % 23   DLVA Predicted % 56   TLC L 4.99   TLC % Predicted % 101   RV % Predicted % 149     Outpatient Medications Prior to Visit  Medication Sig Dispense Refill   acetaminophen  (TYLENOL ) 500 MG tablet Take 2 tablets (1,000 mg total) by mouth every 8 (eight) hours as needed.     albuterol  (VENTOLIN  HFA) 108 (90 Base) MCG/ACT inhaler INHALE 2 PUFFS INTO THE LUNGS EVERY 4-6 HOURS AS NEEDED FOR TIGHTNESS/WHEEZING (Patient taking differently: Inhale 2 puffs into the lungs every 6 (six) hours as needed for wheezing or shortness of breath.) 8.5 each 0   amLODipine  (NORVASC ) 10 MG tablet TAKE 1 TABLET BY MOUTH EVERY DAY FOR BLOOD PRESSURE 90 tablet 0   Ascorbic Acid (VITAMIN C) 1000 MG tablet Take 1,000 mg by mouth daily.     aspirin  EC 81 MG tablet Take 162 mg by  mouth daily.     atorvastatin  (LIPITOR ) 80 MG tablet Take 1 tablet (80 mg total) by mouth daily. for cholesterol. 90 tablet 3   diphenhydrAMINE (BENADRYL) 25 MG tablet Take 25 mg by mouth every 6 (six) hours as needed for allergies.     doxycycline  (VIBRA -TABS) 100 MG tablet Take 1 tablet (100 mg total) by mouth 2 (two) times daily. 14 tablet 0   escitalopram  (LEXAPRO ) 20 MG tablet TAKE 1 TABLET BY MOUTH DAILY FOR ANXIETY 90 tablet 1   famotidine  (PEPCID ) 20 MG tablet Take 20 mg by mouth daily as needed for heartburn or indigestion.     ferrous sulfate  325 (65 FE) MG tablet Take 1 tablet (325 mg total) by mouth daily with breakfast. 60 tablet 0   fluticasone -salmeterol (WIXELA INHUB) 250-50 MCG/ACT AEPB INHALE 1 PUFF INTO THE LUNGS IN THE MORNING AND AT BEDTIME. 180 each 1   furosemide  (LASIX ) 20 MG tablet TAKE 1-2 TABLETS BY MOUTH ONCE DAILY FOR LEG SWELLING. 30 tablet 0   methocarbamol  (ROBAXIN ) 500 MG tablet Take 1 tablet (500 mg total) by mouth every 8 (eight) hours as needed for muscle spasms. 60 tablet 0   pyridOXINE (VITAMIN  B-6) 25 MG tablet Take 25 mg by mouth daily.     sodium chloride  1 g tablet Take 1 tablet (1 g total) by mouth 2 (two) times daily with a meal. To increase sodium 60 tablet 0   traMADol  (ULTRAM ) 50 MG tablet Take 2 tablets (100 mg total) by mouth every 12 (twelve) hours as needed for severe pain (pain score 7-10). 60 tablet 0   vitamin B-12 (CYANOCOBALAMIN ) 1000 MCG tablet Take 1,000 mcg by mouth daily.     Vitamin D , Ergocalciferol , (DRISDOL ) 1.25 MG (50000 UNIT) CAPS capsule Take 1 capsule (50,000 Units total) by mouth every 7 (seven) days. (Patient taking differently: Take 50,000 Units by mouth every 7 (seven) days. Takes on Sat) 7 capsule 0   vitamin E 180 MG (400 UNITS) capsule Take 800 Units by mouth daily.     cefpodoxime  (VANTIN ) 200 MG tablet Take 1 tablet (200 mg total) by mouth 2 (two) times daily for 7 days. 14 tablet 0   No facility-administered medications prior to visit.

## 2024-01-01 ENCOUNTER — Telehealth: Payer: Self-pay

## 2024-01-01 ENCOUNTER — Telehealth: Payer: Self-pay | Admitting: Physician Assistant

## 2024-01-01 NOTE — Telephone Encounter (Signed)
 Letter sent to patient in re: to scheduling R CEA with [facility].

## 2024-01-01 NOTE — Telephone Encounter (Signed)
 Called and spoke with patient.  Discussed results of her most recent chest imaging showed no signs of pneumothorax or pleural effusion.  She reports no sob.  She has been seeing pulmonology for her lung nodule.  She can follow up with our group as needed. Discussed indications to call our office and reasons to go to the ED for evaluation. She can call with any questions. Patient stated understanding.   Juliett Eastburn M Jacklyne Baik, PA-C

## 2024-01-07 ENCOUNTER — Other Ambulatory Visit: Payer: Self-pay | Admitting: Primary Care

## 2024-01-07 DIAGNOSIS — E871 Hypo-osmolality and hyponatremia: Secondary | ICD-10-CM

## 2024-01-10 NOTE — Telephone Encounter (Signed)
Patient appts scheduled:

## 2024-01-15 ENCOUNTER — Ambulatory Visit: Payer: Self-pay

## 2024-01-15 ENCOUNTER — Other Ambulatory Visit: Payer: Self-pay | Admitting: Primary Care

## 2024-01-15 DIAGNOSIS — E785 Hyperlipidemia, unspecified: Secondary | ICD-10-CM

## 2024-01-15 NOTE — Telephone Encounter (Signed)
 FYI Only or Action Required?: Action required by provider  Patient was last seen in primary care on 11/29/2023 by Gabriel John, NP. Called Nurse Triage reporting Headache. Symptoms began several days ago. Interventions attempted: OTC medications: tylenol . Symptoms are: unchanged.  Triage Disposition: See Physician Within 24 Hours  Patient/caregiver understands and will follow disposition?: No, wishes to speak with PCP  Copied from CRM [id number]. Topic: Clinical - Red Word Triage >> Jan 15, 2024  2:16 PM Denise [name] wrote: Red Word that prompted transfer to Nurse Triage: patient called in stating that she does not have any blood pressure medication left, and that her pressure has been high. NP Aneta Bar told her to call the office if her pressure becomes unstable. She states that her blood pressure has been: 132/83, 140/88, 135/85, 147/87.  Patient states that she has been experiencing headaches, denies any other symptoms. Reason for Disposition  [1] New headache AND [2] age > 26  Answer Assessment - Initial Assessment Questions 1. LOCATION: Where does it hurt?      forehead 2. ONSET: When did the headache start? (Minutes, hours or days)      About 4 days approx 3. PATTERN: Does the pain come and go, or has it been constant since it started?     intermittent 4. SEVERITY: How bad is the pain? and What does it keep you from doing?  (e.g., Scale 1-10; mild, moderate, or severe)   - MILD (1-3): doesn't interfere with normal activities    - MODERATE (4-7): interferes with normal activities or awakens from sleep    - SEVERE (8-10): excruciating pain, unable to do any normal activities        Mild currently, woke this morning with a severe HA, took tylenol  5. RECURRENT SYMPTOM: Have you ever had headaches before? If Yes, ask: When was the last time? and What happened that time?      Did not get the HA during amlodipine  and lisinopril  6. CAUSE: What do you think is causing the  headache?     HTN 7. MIGRAINE: Have you been diagnosed with migraine headaches? If Yes, ask: Is this headache similar?      Yes, vision issues/pressure in R eye 8. HEAD INJURY: Has there been any recent injury to the head?      denies 9. OTHER SYMPTOMS: Do you have any other symptoms? (fever, stiff neck, eye pain, sore throat, cold symptoms)     denies  Protocols used: Headache-A-AH

## 2024-01-15 NOTE — Telephone Encounter (Signed)
 Patient is due for CPE/follow up in September, this will be required prior to any further refills.  Please schedule, thank you!

## 2024-01-15 NOTE — Telephone Encounter (Signed)
 How is she out of blood pressure medication? I sent a 90 day supply of amlodipine  to her pharmacy on 12/15/23. Has she called her pharmacy?  It looks like the hospital doctor discontinued her lisinopril  in March. So no, do not take it.

## 2024-01-16 ENCOUNTER — Other Ambulatory Visit: Payer: Self-pay | Admitting: Primary Care

## 2024-01-16 DIAGNOSIS — I10 Essential (primary) hypertension: Secondary | ICD-10-CM

## 2024-01-16 MED ORDER — LISINOPRIL 10 MG PO TABS: 10.0000 mg | ORAL_TABLET | Freq: Every day | ORAL | 0 refills | Status: DC

## 2024-01-16 NOTE — Telephone Encounter (Signed)
 Called and spoke with patient, scheduled 2 week BP check. Patient says she has been taking the sodium tablets occasionally when she feels her sodium is low. Says she can tell by the way she feels (ex gets fatigued). She hasn't been taking on a consistent basis.

## 2024-01-16 NOTE — Telephone Encounter (Signed)
 Called and spoke with patient, she is not having issues with the amlodipine , she has plenty of that. Her concern is that she was taken off the lisinopril  in the hospital due to low sodium levels. She was then put on a sodium pill but told to remain off of the lisinopril . She was not comfortable with that decision since it did not come from her PCP so once she was out she began taking her lisinopril  again, she was having good BP reading per her report. She ran out 3 weeks ago and did not have any refills left on the prescription, for the past 2 weeks she has been getting high bp readings, this morning her BP was 159/98. Some other readings include: 132/83, 140/88, 135/85, 147/87. Pt is requesting to be put back on lisinopril  or another med as she feels she does not have issues with her sodium any longer and is concerned with the readings she is getting. States she was on lisinopril  and amlodipine  together for years and it did well controlling her BP.

## 2024-01-16 NOTE — Telephone Encounter (Signed)
 Noted. I will refill lisinopril  at 10 mg given the readings she's discussing. I do want to see her back in the office in 2 weeks for BP check and sodium check.  Please schedule.  Is she is taking the sodium tablets still?

## 2024-01-16 NOTE — Telephone Encounter (Signed)
 Noted

## 2024-01-16 NOTE — Telephone Encounter (Signed)
 Called pt and schedule appt for cpe / lab

## 2024-01-19 LAB — FUNGUS CULTURE WITH STAIN

## 2024-01-19 LAB — FUNGUS CULTURE RESULT

## 2024-01-19 LAB — FUNGAL ORGANISM REFLEX

## 2024-01-20 NOTE — Progress Notes (Signed)
 Patient name: Denise [name] MRN: [id number] DOB: 10/13/1960 Sex: female  REASON FOR CONSULT: Follow-up to discuss PAD  HPI: Denise [name] is a 63 y.o. female, with history of hypertension and carotid artery disease that presents to discuss PAD.  She was scheduled for right carotid endarterectomy earlier this year and fell off her porch breaking multiple ribs and surgery was canceled.  States she then had swelling and ecchymosis to her right leg that was very slow to heal.  Her primary care doctor did a DVT study that was negative but also got ABIs.  These were 0.69 on the right and 0.85 on the left.  She denies any leg pain when walking.  No open wounds.  Just feels generally tired with significant exertion.  Patient is well-known to our practice and was seen last year with carotid artery disease.  She was noted in April or May 2024 to have having blurry vision in her right eye.  Ultimately this led to evaluation by ophthalmology and she states she was told she had evidence of diabetic retinopathy although she has no history of diabetes. As part of the workup she did have carotid ultrasound.  Patient had carotid ultrasound on 04/23/2023 with findings suggesting hemodynamically significant stenosis in the right carotid artery distal to a significant anterior wall plaque with shadowing and also a high-grade left carotid stenosis 70 to 99%.  She did have an MRI brain in the ED on 02/21/2023 with no acute intracranial abnormality.  I sent her for CTA neck that showed bilateral high-grade stenosis.  She was previously offered a right carotid endarterectomy.    Past Medical History:  Diagnosis Date   Allergy    Anemia 12/24/2019   Anxiety    Aortic stenosis    Arthritis    Carotid artery disease (HCC)    a.) doppler 04/23/2023: 70-99% LEFT carotid bifurcation   Centrilobular emphysema (HCC) 11/2023   Chickenpox    Cholecystitis with cholelithiasis 03/31/2012   Closed fracture of L4  transverse process s/p traumatic mechanical fall 10/26/2023   COPD (chronic obstructive pulmonary disease) (HCC)    Coronary artery disease    Depression    Diastolic dysfunction    Dyspnea    with exertion   GERD (gastroesophageal reflux disease)    Headache    Heart murmur    never has caused any problems   High anion gap metabolic acidosis 10/26/2023   History of blood transfusion 2021   HLD (hyperlipidemia)    Hypertension 03/31/2012   Hyponatremia in setting of SIADH    Left lower lobe pulmonary nodule 11/2023   Long-term use of aspirin  therapy    Lumbar compression fracture s/p traumatic mechanical fall; L4 superior endplate    Pneumonia    x 2   PVD (peripheral vascular disease) (HCC)    Shingles    SIADH (syndrome of inappropriate ADH production) (HCC)    Traumatic mechanical fall 10/26/2023   a.) s/p traumatic fall 10/26/2023 reuslting in multiple RIGHT rib (5th - 7th) fractures (required dchest tube), l$ transverse process fracture, L4 superior endplate compression fracture, large volume post-traumatic myofacial/subcutaneous gas collection in chest    Past Surgical History:  Procedure Laterality Date   BIOPSY  12/24/2019   Procedure: BIOPSY;  Surgeon: Janel Medford, MD;  Location: [facility] ENDOSCOPY;  Service: Endoscopy;;   BRONCHIAL BIOPSY  12/19/2023   Procedure: BRONCHOSCOPY, WITH BIOPSY;  Surgeon: Vergia Glasgow, MD;  Location: [facility] ENDOSCOPY;  Service: Pulmonary;;  BRONCHIAL BRUSHINGS  12/19/2023   Procedure: BRONCHOSCOPY, WITH BRUSH BIOPSY;  Surgeon: Vergia Glasgow, MD;  Location: [facility] ENDOSCOPY;  Service: Pulmonary;;   BRONCHIAL NEEDLE ASPIRATION BIOPSY  12/19/2023   Procedure: BRONCHOSCOPY, WITH NEEDLE ASPIRATION BIOPSY;  Surgeon: Vergia Glasgow, MD;  Location: [facility] ENDOSCOPY;  Service: Pulmonary;;   BRONCHIAL WASHINGS  12/19/2023   Procedure: IRRIGATION, BRONCHUS;  Surgeon: Vergia Glasgow, MD;  Location: [facility] ENDOSCOPY;  Service: Pulmonary;;   BRONCHOSCOPY, WITH BIOPSY  USING ELECTROMAGNETIC NAVIGATION Bilateral 12/19/2023   Procedure: ROBOTIC ASSISTED NAVIGATIONAL BRONCHOSCOPY;  Surgeon: Vergia Glasgow, MD;  Location: [facility] ENDOSCOPY;  Service: Pulmonary;  Laterality: Bilateral;   CATARACT EXTRACTION Bilateral 2021   CHOLECYSTECTOMY  03/30/2012   Procedure: LAPAROSCOPIC CHOLECYSTECTOMY WITH INTRAOPERATIVE CHOLANGIOGRAM;  Surgeon: Quitman Bucy, MD;  Location: [facility] ORS;  Service: General;  Laterality: N/A;   COLONOSCOPY WITH PROPOFOL  N/A 12/24/2019   Procedure: COLONOSCOPY WITH PROPOFOL ;  Surgeon: Janel Medford, MD;  Location: [facility] ENDOSCOPY;  Service: Endoscopy;  Laterality: N/A;   ESOPHAGOGASTRODUODENOSCOPY (EGD) WITH PROPOFOL  N/A 12/24/2019   Procedure: ESOPHAGOGASTRODUODENOSCOPY (EGD) WITH PROPOFOL ;  Surgeon: Janel Medford, MD;  Location: [facility] ENDOSCOPY;  Service: Endoscopy;  Laterality: N/A;   HOT HEMOSTASIS N/A 12/24/2019   Procedure: HOT HEMOSTASIS (ARGON PLASMA COAGULATION/BICAP);  Surgeon: Janel Medford, MD;  Location: [facility] ENDOSCOPY;  Service: Endoscopy;  Laterality: N/A;   POLYPECTOMY  12/24/2019   Procedure: POLYPECTOMY;  Surgeon: Janel Medford, MD;  Location: [facility] ENDOSCOPY;  Service: Endoscopy;;   TUBAL LIGATION  1997   VIDEO BRONCHOSCOPY WITH ENDOBRONCHIAL ULTRASOUND  12/19/2023   Procedure: BRONCHOSCOPY, WITH EBUS;  Surgeon: Vergia Glasgow, MD;  Location: [facility] ENDOSCOPY;  Service: Pulmonary;;   VIDEO BRONCHOSCOPY WITH RADIAL ENDOBRONCHIAL ULTRASOUND  12/19/2023   Procedure: VIDEO BRONCHOSCOPY WITH RADIAL ENDOBRONCHIAL ULTRASOUND;  Surgeon: Vergia Glasgow, MD;  Location: [facility] ENDOSCOPY;  Service: Pulmonary;;    Family History  Problem Relation Age of Onset   Arthritis Mother    Cervical cancer Mother    Heart disease Father    Hypertension Father     SOCIAL HISTORY: Social History   Socioeconomic History   Marital status: Widowed    Spouse name: Not on file   Number of children: 2   Years of education: Not on file   Highest education  level: 12th grade  Occupational History   Not on file  Tobacco Use   Smoking status: Former    Current packs/day: 0.00    Average packs/day: 0.8 packs/day for 38.0 years (28.5 ttl pk-yrs)    Types: Cigarettes    Start date: 05/06/1985    Quit date: 05/07/2023    Years since quitting: 0.7   Smokeless tobacco: Never  Vaping Use   Vaping status: Never Used  Substance and Sexual Activity   Alcohol use: Yes    Alcohol/week: 1.0 standard drink of alcohol    Types: 1 Standard drinks or equivalent per week    Comment: occasionally   Drug use: No   Sexual activity: Not Currently    Birth control/protection: Post-menopausal  Other Topics Concern   Not on file  Social History Narrative   Widow.    2 children.   Works in Clinical biochemist.   Enjoys reading, cooking.    Social Drivers of Health   Financial Resource Strain: Low Risk  (11/29/2023)   Overall Financial Resource Strain (CARDIA)    Difficulty of Paying Living Expenses: Not very hard  Food Insecurity: Patient Declined (11/29/2023)   Hunger Vital Sign  Worried About Programme researcher, broadcasting/film/video in the Last Year: Patient declined    Barista in the Last Year: Patient declined  Transportation Needs: Unmet Transportation Needs (11/29/2023)   PRAPARE - Administrator, Civil Service (Medical): No    Lack of Transportation (Non-Medical): Yes  Physical Activity: Inactive (11/29/2023)   Exercise Vital Sign    Days of Exercise per Week: 0 days    Minutes of Exercise per Session: 30 min  Stress: Stress Concern Present (11/29/2023)   [name] of Occupational Health - Occupational Stress Questionnaire    Feeling of Stress : Very much  Social Connections: Unknown (11/29/2023)   Social Connection and Isolation Panel    Frequency of Communication with Friends and Family: More than three times a week    Frequency of Social Gatherings with Friends and Family: Never    Attends Religious Services: Patient declined    Automotive engineer or Organizations: No    Attends Engineer, structural: Not on file    Marital Status: Widowed  Catering manager Violence: Not on file    No Known Allergies   Current Outpatient Medications  Medication Sig Dispense Refill   acetaminophen  (TYLENOL ) 500 MG tablet Take 2 tablets (1,000 mg total) by mouth every 8 (eight) hours as needed.     albuterol  (VENTOLIN  HFA) 108 (90 Base) MCG/ACT inhaler INHALE 2 PUFFS INTO THE LUNGS EVERY 4-6 HOURS AS NEEDED FOR TIGHTNESS/WHEEZING (Patient taking differently: Inhale 2 puffs into the lungs every 6 (six) hours as needed for wheezing or shortness of breath.) 8.5 each 0   amLODipine  (NORVASC ) 10 MG tablet TAKE 1 TABLET BY MOUTH EVERY DAY FOR BLOOD PRESSURE 90 tablet 0   Ascorbic Acid (VITAMIN C) 1000 MG tablet Take 1,000 mg by mouth daily.     aspirin  EC 81 MG tablet Take 162 mg by mouth daily.     atorvastatin  (LIPITOR ) 80 MG tablet TAKE 1 TABLET (80 MG TOTAL) BY MOUTH DAILY FOR CHOLESTEROL 90 tablet 0   diphenhydrAMINE (BENADRYL) 25 MG tablet Take 25 mg by mouth every 6 (six) hours as needed for allergies.     escitalopram  (LEXAPRO ) 20 MG tablet TAKE 1 TABLET BY MOUTH DAILY FOR ANXIETY 90 tablet 1   famotidine  (PEPCID ) 20 MG tablet Take 20 mg by mouth daily as needed for heartburn or indigestion.     ferrous sulfate  325 (65 FE) MG tablet Take 1 tablet (325 mg total) by mouth daily with breakfast. 60 tablet 0   fluticasone -salmeterol (WIXELA INHUB) 250-50 MCG/ACT AEPB INHALE 1 PUFF INTO THE LUNGS IN THE MORNING AND AT BEDTIME. 180 each 1   furosemide  (LASIX ) 20 MG tablet TAKE 1-2 TABLETS BY MOUTH ONCE DAILY FOR LEG SWELLING. 30 tablet 0   lisinopril  (ZESTRIL ) 10 MG tablet Take 1 tablet (10 mg total) by mouth daily. for blood pressure. 90 tablet 0   pyridOXINE (VITAMIN B-6) 25 MG tablet Take 25 mg by mouth daily.     sodium chloride  1 g tablet TAKE 1 TABLET (1 G TOTAL) BY MOUTH 2 (TWO) TIMES DAILY WITH A MEAL. TO INCREASE SODIUM 60  tablet 0   traMADol  (ULTRAM ) 50 MG tablet Take 2 tablets (100 mg total) by mouth every 12 (twelve) hours as needed for severe pain (pain score 7-10). 60 tablet 0   umeclidinium bromide  (INCRUSE ELLIPTA ) 62.5 MCG/ACT AEPB Inhale 1 puff into the lungs daily. 30 each 12   vitamin B-12 (CYANOCOBALAMIN ) 1000  MCG tablet Take 1,000 mcg by mouth daily.     Vitamin D , Ergocalciferol , (DRISDOL ) 1.25 MG (50000 UNIT) CAPS capsule Take 1 capsule (50,000 Units total) by mouth every 7 (seven) days. (Patient taking differently: Take 50,000 Units by mouth every 7 (seven) days. Takes on Sat) 7 capsule 0   vitamin E 180 MG (400 UNITS) capsule Take 800 Units by mouth daily.     No current facility-administered medications for this visit.    REVIEW OF SYSTEMS:  [X]  denotes positive finding, [ ]  denotes negative finding Cardiac  Comments:  Chest pain or chest pressure:    Shortness of breath upon exertion:    Short of breath when lying flat:    Irregular heart rhythm:        Vascular    Pain in calf, thigh, or hip brought on by ambulation:    Pain in feet at night that wakes you up from your sleep:     Blood clot in your veins:    Leg swelling:         Pulmonary    Oxygen at home:    Productive cough:     Wheezing:         Neurologic    Sudden weakness in arms or legs:     Sudden numbness in arms or legs:     Sudden onset of difficulty speaking or slurred speech:    Temporary loss of vision in one eye:     Problems with dizziness:         Gastrointestinal    Blood in stool:     Vomited blood:         Genitourinary    Burning when urinating:     Blood in urine:        Psychiatric    Major depression:         Hematologic    Bleeding problems:    Problems with blood clotting too easily:        Skin    Rashes or ulcers:        Constitutional    Fever or chills:      PHYSICAL EXAM: There were no vitals filed for this visit.  GENERAL: The patient is a well-nourished female, in no  acute distress. The vital signs are documented above. CARDIAC: There is a regular rate and rhythm.  VASCULAR:  No previous neck incisions PULMONARY: No respiratory distress. ABDOMEN: Soft and non-tender. MUSCULOSKELETAL: There are no major deformities or cyanosis. NEUROLOGIC: No focal weakness or paresthesias are detected.  Grossly neurologically intact SKIN: There are no ulcers or rashes noted. PSYCHIATRIC: The patient has a normal affect.  DATA:   ABI reviewed 12/13/23 and 0 .69 on the right and 0.85 on the left  CTA neck reviewed from 06/20/2023 with evidence of bilateral high-grade ICA stenosis  Assessment/Plan:  63 year-old female with history of hypertension and carotid artery disease the presents to discuss PAD.  Discussed her ABIs do show moderate PAD on the right and I do not palpate a pedal pulse on the right.  That being said she really has asymptomatic PAD.  She denies any claudication symptoms and has no rest pain or tissue loss to suggest CLI.  I think this can be followed long-term with ongoing surveillance and repeat ABIs and likely 1 year.  I think her bigger issue is her carotid artery disease.  She was scheduled for carotid endarterectomy on the right earlier this year and this was  canceled after she had trauma with mechanical fall and broke multiple ribs.  She was having blurry vision in her right eye in April/May 2024 and evaluated with ophthalmology and she reports findings consistent with diabetic retinopathy although she has no history of diabetes.  As part of this workup she had a carotid ultrasound on 04/23/2023 showing a high-grade 70 to 99% left ICA stenosis and there is also suggestion of a possible high-grade contralateral stenosis on the right but difficult to establish with significant anterior wall plaque with shadowing.  Ultimately I sent her for CTA neck showing bilateral high-grade ICA stenosis.  I previously discussed the focus to be on the right side and had  recommended a right carotid endarterectomy due to significant calcification with near occlusive stenosis.  I again discussed this in detail with her.  Discussed goal is to reduce stroke risk.  This is heavily calcified and not amendable to stenting.  Discussed 1% stroke risk along with other risks and benefits.  Will get scheduled and also will order carotid duplex to ensure this is still open prior to surgery.     Young Hensen, MD [facility] Office: [phone]

## 2024-01-21 ENCOUNTER — Other Ambulatory Visit: Payer: Self-pay

## 2024-01-21 ENCOUNTER — Ambulatory Visit: Attending: Vascular Surgery | Admitting: Vascular Surgery

## 2024-01-21 ENCOUNTER — Encounter: Payer: Self-pay | Admitting: Vascular Surgery

## 2024-01-21 VITALS — BP 130/80 | HR 97 | Temp 98.5°F | Resp 20 | Ht 63.0 in | Wt 160.7 lb

## 2024-01-21 DIAGNOSIS — I6523 Occlusion and stenosis of bilateral carotid arteries: Secondary | ICD-10-CM

## 2024-01-22 ENCOUNTER — Other Ambulatory Visit

## 2024-01-23 ENCOUNTER — Other Ambulatory Visit: Payer: Self-pay | Admitting: *Deleted

## 2024-01-23 DIAGNOSIS — I6523 Occlusion and stenosis of bilateral carotid arteries: Secondary | ICD-10-CM

## 2024-01-29 ENCOUNTER — Ambulatory Visit (HOSPITAL_COMMUNITY)
Admission: RE | Admit: 2024-01-29 | Discharge: 2024-01-29 | Disposition: A | Discharge status: Home / Self Care | Source: Ambulatory Visit | Attending: Vascular Surgery | Admitting: Vascular Surgery

## 2024-01-29 DIAGNOSIS — I6523 Occlusion and stenosis of bilateral carotid arteries: Secondary | ICD-10-CM

## 2024-01-31 ENCOUNTER — Encounter: Payer: Self-pay | Admitting: Primary Care

## 2024-01-31 ENCOUNTER — Ambulatory Visit (INDEPENDENT_AMBULATORY_CARE_PROVIDER_SITE_OTHER): Admitting: Primary Care

## 2024-01-31 VITALS — BP 160/88 | HR 69 | Temp 97.3°F | Ht 63.0 in | Wt 163.0 lb

## 2024-01-31 DIAGNOSIS — I10 Essential (primary) hypertension: Secondary | ICD-10-CM | POA: Diagnosis not present

## 2024-01-31 DIAGNOSIS — J449 Chronic obstructive pulmonary disease, unspecified: Secondary | ICD-10-CM | POA: Diagnosis not present

## 2024-01-31 DIAGNOSIS — F32A Depression, unspecified: Secondary | ICD-10-CM | POA: Diagnosis not present

## 2024-01-31 DIAGNOSIS — F419 Anxiety disorder, unspecified: Secondary | ICD-10-CM

## 2024-01-31 MED ORDER — LISINOPRIL 30 MG PO TABS: 30.0000 mg | ORAL_TABLET | Freq: Every day | ORAL | 0 refills | Status: DC

## 2024-01-31 MED ORDER — BUSPIRONE HCL 5 MG PO TABS: 5.0000 mg | ORAL_TABLET | Freq: Two times a day (BID) | ORAL | 0 refills | Status: DC

## 2024-01-31 MED ORDER — ALBUTEROL SULFATE HFA 108 (90 BASE) MCG/ACT IN AERS: INHALATION_SPRAY | RESPIRATORY_TRACT | 0 refills | Status: AC

## 2024-01-31 NOTE — Assessment & Plan Note (Signed)
 Above goal which could very well be secondary to her uncontrolled anxiety. She is very anxious today.  Given normal BP reading last week and prior weeks we will continue lisinopril  30 mg daily and amlodipine  10 mg daily. Will work to treat anxiety.  She agrees and will monitor blood pressure at home.

## 2024-01-31 NOTE — Progress Notes (Signed)
 Subjective:    Patient ID: Denise [name], female    DOB: 05-Dec-1960, 63 y.o.   MRN: [id number]  HPI  Denise [name] is a very pleasant 63 y.o. female with a history of hypertension, cardiomegaly, carotid atherosclerosis, CAD, COPD, SIADH, tobacco use, hyperlipidemia who presents today for follow-up of blood pressure.  Currently managed on lisinopril  10 mg daily, has been taking 30 mg daily as this was her prior regimen.  Her dose was reduced after her recent hospitalization.  She is also managed on amlodipine  10 mg daily.   She believes her BP is so high due to her constant anxiety. She constantly worries, thinks worst case scenario regarding her health, mind racing thoughts. She is checking her BP at home which is running 140-150/80 range.  She has not checked her blood pressure over the last week.  She is taking her salt tablets twice daily, no longer taking furosemide .   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 She is scheduled for surgery on 02/12/24 to the left side. She is very nervous about this and more anxious today today as she's received several calls regarding her upcoming surgery. She is managed on atorvastatin  80 mg and aspirin  81 mg daily.   She continues to struggle with abnormal and reduced vision to her right eye. She does have new glasses coming soon.   She is managed on Lexapro  20 mg daily which helped historically until recently. She is constantly anxious, thinks this is affecting her BP levels.   BP Readings from Last 3 Encounters:  01/31/24 (!) 160/88  01/21/24 130/80  12/26/23 132/64      01/31/2024   11:14 AM 11/29/2023    2:30 PM 05/23/2023    2:02 PM 04/16/2023    7:37 AM  GAD 7 : Generalized Anxiety Score  Nervous, Anxious, on Edge 3 3 2 3   Control/stop worrying 3 2 2 3   Worry too much - different things 3 2 2 3   Trouble relaxing 3 3 2 2   Restless 3  3 2 2   Easily annoyed or irritable 2 3 1 2   Afraid - awful might happen 3 1 2 2   Total GAD 7 Score 20 17 13 17   Anxiety Difficulty Very difficult Extremely difficult Not difficult at all Somewhat difficult      Review of Systems  Respiratory:  Negative for shortness of breath.   Cardiovascular:  Negative for chest pain.  Psychiatric/Behavioral:  The patient is nervous/anxious.          Past Medical History:  Diagnosis Date   Allergy    Anemia 12/24/2019   Anxiety    Aortic stenosis    Arthritis    Carotid artery disease (HCC)    a.) doppler 04/23/2023: 70-99% LEFT carotid bifurcation   Centrilobular emphysema (HCC) 11/2023   Chickenpox    Cholecystitis with cholelithiasis 03/31/2012   Closed fracture of L4 transverse process s/p traumatic mechanical fall 10/26/2023   COPD (chronic obstructive pulmonary disease) (HCC)    Coronary artery disease    Depression    Diastolic dysfunction    Dyspnea    with exertion   GERD (gastroesophageal reflux disease)    Headache    Heart murmur    never has caused any problems   High anion gap metabolic acidosis 10/26/2023   History of blood transfusion 2021   HLD (hyperlipidemia)    Hypertension  03/31/2012   Hyponatremia in setting of SIADH    Left lower lobe pulmonary nodule 11/2023   Long-term use of aspirin  therapy    Lumbar compression fracture s/p traumatic mechanical fall; L4 superior endplate    Pneumonia    x 2   PVD (peripheral vascular disease) (HCC)    Shingles    SIADH (syndrome of inappropriate ADH production) (HCC)    Traumatic mechanical fall 10/26/2023   a.) s/p traumatic fall 10/26/2023 reuslting in multiple RIGHT rib (5th - 7th) fractures (required dchest tube), l$ transverse process fracture, L4 superior endplate compression fracture, large volume post-traumatic myofacial/subcutaneous gas collection in chest    Social History   Socioeconomic History   Marital status: Widowed    Spouse name: Not on file    Number of children: 2   Years of education: Not on file   Highest education level: 12th grade  Occupational History   Not on file  Tobacco Use   Smoking status: Former    Current packs/day: 0.00    Average packs/day: 0.8 packs/day for 38.0 years (28.5 ttl pk-yrs)    Types: Cigarettes    Start date: 05/06/1985    Quit date: 05/07/2023    Years since quitting: 0.7   Smokeless tobacco: Never  Vaping Use   Vaping status: Never Used  Substance and Sexual Activity   Alcohol use: Yes    Alcohol/week: 1.0 standard drink of alcohol    Types: 1 Standard drinks or equivalent per week    Comment: occasionally   Drug use: No   Sexual activity: Not Currently    Birth control/protection: Post-menopausal  Other Topics Concern   Not on file  Social History Narrative   Widow.    2 children.   Works in Clinical biochemist.   Enjoys reading, cooking.    Social Drivers of Health   Financial Resource Strain: Patient Declined (01/28/2024)   Overall Financial Resource Strain (CARDIA)    Difficulty of Paying Living Expenses: Patient declined  Food Insecurity: Food Insecurity Present (01/28/2024)   Hunger Vital Sign    Worried About Running Out of Food in the Last Year: Sometimes true    Ran Out of Food in the Last Year: Patient declined  Transportation Needs: No Transportation Needs (01/28/2024)   PRAPARE - Administrator, Civil Service (Medical): No    Lack of Transportation (Non-Medical): No  Recent Concern: Transportation Needs - Unmet Transportation Needs (11/29/2023)   PRAPARE - Transportation    Lack of Transportation (Medical): No    Lack of Transportation (Non-Medical): Yes  Physical Activity: Insufficiently Active (01/28/2024)   Exercise Vital Sign    Days of Exercise per Week: 4 days    Minutes of Exercise per Session: 20 min  Stress: Stress Concern Present (01/28/2024)   [name] of Occupational Health - Occupational Stress Questionnaire    Feeling of Stress: Very  much  Social Connections: Socially Isolated (01/28/2024)   Social Connection and Isolation Panel    Frequency of Communication with Friends and Family: Three times a week    Frequency of Social Gatherings with Friends and Family: Patient declined    Attends Religious Services: Patient declined    Active Member of Clubs or Organizations: No    Attends Engineer, structural: Not on file    Marital Status: Widowed  Intimate Partner Violence: Not on file    Past Surgical History:  Procedure Laterality Date   BIOPSY  12/24/2019   Procedure: BIOPSY;  Surgeon: Teressa Toribio SQUIBB, MD;  Location: [facility] ENDOSCOPY;  Service: Endoscopy;;   BRONCHIAL BIOPSY  12/19/2023   Procedure: BRONCHOSCOPY, WITH BIOPSY;  Surgeon: Isadora Hose, MD;  Location: [facility] ENDOSCOPY;  Service: Pulmonary;;   BRONCHIAL BRUSHINGS  12/19/2023   Procedure: BRONCHOSCOPY, WITH BRUSH BIOPSY;  Surgeon: Isadora Hose, MD;  Location: [facility] ENDOSCOPY;  Service: Pulmonary;;   BRONCHIAL NEEDLE ASPIRATION BIOPSY  12/19/2023   Procedure: BRONCHOSCOPY, WITH NEEDLE ASPIRATION BIOPSY;  Surgeon: Isadora Hose, MD;  Location: [facility] ENDOSCOPY;  Service: Pulmonary;;   BRONCHIAL WASHINGS  12/19/2023   Procedure: IRRIGATION, BRONCHUS;  Surgeon: Isadora Hose, MD;  Location: [facility] ENDOSCOPY;  Service: Pulmonary;;   BRONCHOSCOPY, WITH BIOPSY USING ELECTROMAGNETIC NAVIGATION Bilateral 12/19/2023   Procedure: ROBOTIC ASSISTED NAVIGATIONAL BRONCHOSCOPY;  Surgeon: Isadora Hose, MD;  Location: [facility] ENDOSCOPY;  Service: Pulmonary;  Laterality: Bilateral;   CATARACT EXTRACTION Bilateral 2021   CHOLECYSTECTOMY  03/30/2012   Procedure: LAPAROSCOPIC CHOLECYSTECTOMY WITH INTRAOPERATIVE CHOLANGIOGRAM;  Surgeon: Morene ONEIDA Olives, MD;  Location: [facility] ORS;  Service: General;  Laterality: N/A;   COLONOSCOPY WITH PROPOFOL  N/A 12/24/2019   Procedure: COLONOSCOPY WITH PROPOFOL ;  Surgeon: Teressa Toribio SQUIBB, MD;  Location: [facility] ENDOSCOPY;  Service: Endoscopy;  Laterality: N/A;    ESOPHAGOGASTRODUODENOSCOPY (EGD) WITH PROPOFOL  N/A 12/24/2019   Procedure: ESOPHAGOGASTRODUODENOSCOPY (EGD) WITH PROPOFOL ;  Surgeon: Teressa Toribio SQUIBB, MD;  Location: [facility] ENDOSCOPY;  Service: Endoscopy;  Laterality: N/A;   HOT HEMOSTASIS N/A 12/24/2019   Procedure: HOT HEMOSTASIS (ARGON PLASMA COAGULATION/BICAP);  Surgeon: Teressa Toribio SQUIBB, MD;  Location: [facility] ENDOSCOPY;  Service: Endoscopy;  Laterality: N/A;   POLYPECTOMY  12/24/2019   Procedure: POLYPECTOMY;  Surgeon: Teressa Toribio SQUIBB, MD;  Location: [facility] ENDOSCOPY;  Service: Endoscopy;;   TUBAL LIGATION  1997   VIDEO BRONCHOSCOPY WITH ENDOBRONCHIAL ULTRASOUND  12/19/2023   Procedure: BRONCHOSCOPY, WITH EBUS;  Surgeon: Isadora Hose, MD;  Location: [facility] ENDOSCOPY;  Service: Pulmonary;;   VIDEO BRONCHOSCOPY WITH RADIAL ENDOBRONCHIAL ULTRASOUND  12/19/2023   Procedure: VIDEO BRONCHOSCOPY WITH RADIAL ENDOBRONCHIAL ULTRASOUND;  Surgeon: Isadora Hose, MD;  Location: [facility] ENDOSCOPY;  Service: Pulmonary;;    Family History  Problem Relation Age of Onset   Arthritis Mother    Cervical cancer Mother    Heart disease Father    Hypertension Father     No Known Allergies  Current Outpatient Medications on File Prior to Visit  Medication Sig Dispense Refill   acetaminophen  (TYLENOL ) 500 MG tablet Take 2 tablets (1,000 mg total) by mouth every 8 (eight) hours as needed.     albuterol  (VENTOLIN  HFA) 108 (90 Base) MCG/ACT inhaler INHALE 2 PUFFS INTO THE LUNGS EVERY 4-6 HOURS AS NEEDED FOR TIGHTNESS/WHEEZING 8.5 each 0   amLODipine  (NORVASC ) 10 MG tablet TAKE 1 TABLET BY MOUTH EVERY DAY FOR BLOOD PRESSURE 90 tablet 0   Ascorbic Acid (VITAMIN C) 1000 MG tablet Take 1,000 mg by mouth daily.     aspirin  EC 81 MG tablet Take 162 mg by mouth daily.     atorvastatin  (LIPITOR ) 80 MG tablet TAKE 1 TABLET (80 MG TOTAL) BY MOUTH DAILY FOR CHOLESTEROL 90 tablet 0   cetirizine (ZYRTEC) 10 MG tablet Take 10 mg by mouth daily as needed for allergies.      diphenhydrAMINE (BENADRYL) 25 MG tablet Take 25 mg by mouth every 6 (six) hours as needed for allergies.     escitalopram  (LEXAPRO ) 20 MG tablet TAKE 1 TABLET BY MOUTH DAILY FOR ANXIETY 90 tablet 1   famotidine  (PEPCID ) 20 MG tablet Take 20  mg by mouth daily.     ferrous sulfate  325 (65 FE) MG tablet Take 325 mg by mouth daily with breakfast.     fluticasone -salmeterol (WIXELA INHUB) 250-50 MCG/ACT AEPB INHALE 1 PUFF INTO THE LUNGS IN THE MORNING AND AT BEDTIME. 180 each 1   pyridoxine (B-6) 100 MG tablet Take 100 mg by mouth daily.     sodium chloride  1 g tablet TAKE 1 TABLET (1 G TOTAL) BY MOUTH 2 (TWO) TIMES DAILY WITH A MEAL. TO INCREASE SODIUM 60 tablet 0   vitamin B-12 (CYANOCOBALAMIN ) 1000 MCG tablet Take 1,000 mcg by mouth daily.     vitamin E 180 MG (400 UNITS) capsule Take 400 Units by mouth daily.     No current facility-administered medications on file prior to visit.    BP (!) 160/88   Pulse 69   Temp (!) 97.3 F (36.3 C) (Temporal)   Ht 5' 3 (1.6 m)   Wt 163 lb (73.9 kg)   SpO2 93%   BMI 28.87 kg/m  Objective:   Physical Exam  Cardiovascular:     Rate and Rhythm: Normal rate and regular rhythm.  Pulmonary:     Effort: Pulmonary effort is normal.     Breath sounds: Normal breath sounds.   Musculoskeletal:     Cervical back: Neck supple.   Skin:    General: Skin is warm and dry.   Neurological:     Mental Status: She is alert and oriented to person, place, and time.   Psychiatric:     Comments: Very anxious during visit. Restless. Right leg constantly moving.            Assessment & Plan:  Primary hypertension Assessment & Plan: Above goal which could very well be secondary to her uncontrolled anxiety. She is very anxious today.  Given normal BP reading last week and prior weeks we will continue lisinopril  30 mg daily and amlodipine  10 mg daily. Will work to treat anxiety.  She agrees and will monitor blood pressure at home.  Orders: -      Lisinopril ; Take 1 tablet (30 mg total) by mouth daily. for blood pressure.  Dispense: 90 tablet; Refill: 0  Anxiety and depression Assessment & Plan: Uncontrolled due to recent health problems.  Continue Lexapro  20 mg daily as this has been effective. Add BuSpar 5 mg twice daily.  Consider dose increase if warranted. She will update.  Orders: -     busPIRone HCl; Take 1 tablet (5 mg total) by mouth 2 (two) times daily. For anxiety  Dispense: 180 tablet; Refill: 0        Comer MARLA Gaskins, NP

## 2024-01-31 NOTE — Addendum Note (Signed)
 Addended by: Leathie Weich K on: 01/31/2024 11:37 AM   Modules accepted: Orders

## 2024-01-31 NOTE — Patient Instructions (Signed)
 Continue taking Lexapro  20 mg daily for anxiety.  Start taking buspirone (BuSpar) 5 mg twice daily for anxiety.  Continue taking lisinopril  30 mg daily and amlodipine  10 mg daily for blood pressure.  Keep me updated regarding your blood pressure and anxiety.  It was a pleasure to see you today!

## 2024-01-31 NOTE — Assessment & Plan Note (Signed)
 Uncontrolled due to recent health problems.  Continue Lexapro  20 mg daily as this has been effective. Add BuSpar 5 mg twice daily.  Consider dose increase if warranted. She will update.

## 2024-01-31 NOTE — Pre-Procedure Instructions (Signed)
 Surgical Instructions   Your procedure is scheduled on Wednesday, July 9th. Report to [facility] Main Entrance A at 06:30 A.M., then check in with the Admitting office. Any questions or running late day of surgery: call [phone]  Questions prior to your surgery date: call [phone], Monday-Friday, 8am-4pm. If you experience any cold or flu symptoms such as cough, fever, chills, shortness of breath, etc. between now and your scheduled surgery, please notify us  at the above number.     Remember:  Do not eat or drink after midnight the night before your surgery    Take these medicines the morning of surgery with A SIP OF WATER  amLODipine  (NORVASC )  aspirin  EC  atorvastatin  (LIPITOR )  busPIRone (BUSPAR)  escitalopram  (LEXAPRO )  famotidine  (PEPCID )  fluticasone -salmeterol (WIXELA INHUB)     May take these medicines IF NEEDED: acetaminophen  (TYLENOL )  albuterol  (VENTOLIN  HFA)- bring inhaler with you on day of surgery cetirizine (ZYRTEC)  diphenhydrAMINE (BENADRYL)     One week prior to surgery, STOP taking any Aspirin  (unless otherwise instructed by your surgeon) Aleve, Naproxen, Ibuprofen, Motrin, Advil, Goody's, BC's, all herbal medications, fish oil, and non-prescription vitamins.                     Do NOT Smoke (Tobacco/Vaping) for 24 hours prior to your procedure.  If you use a CPAP at night, you may bring your mask/headgear for your overnight stay.   You will be asked to remove any contacts, glasses, piercing's, hearing aid's, dentures/partials prior to surgery. Please bring cases for these items if needed.    Patients discharged the day of surgery will not be allowed to drive home, and someone needs to stay with them for 24 hours.  SURGICAL WAITING ROOM VISITATION Patients may have no more than 2 support people in the waiting area - these visitors may rotate.   Pre-op nurse will coordinate an appropriate time for 1 ADULT support person, who may not rotate,  to accompany patient in pre-op.  Children under the age of 8 must have an adult with them who is not the patient and must remain in the main waiting area with an adult.  If the patient needs to stay at the hospital during part of their recovery, the visitor guidelines for inpatient rooms apply.  Please refer to the [facility] website for the visitor guidelines for any additional information.   If you received a COVID test during your pre-op visit  it is requested that you wear a mask when out in public, stay away from anyone that may not be feeling well and notify your surgeon if you develop symptoms. If you have been in contact with anyone that has tested positive in the last 10 days please notify you surgeon.      Pre-operative CHG Bathing Instructions   You can play a key role in reducing the risk of infection after surgery. Your skin needs to be as free of germs as possible. You can reduce the number of germs on your skin by washing with CHG (chlorhexidine  gluconate) soap before surgery. CHG is an antiseptic soap that kills germs and continues to kill germs even after washing.   DO NOT use if you have an allergy to chlorhexidine /CHG or antibacterial soaps. If your skin becomes reddened or irritated, stop using the CHG and notify one of our RNs at [phone].              TAKE A SHOWER THE NIGHT BEFORE  SURGERY AND THE DAY OF SURGERY    Please keep in mind the following:  DO NOT shave, including legs and underarms, 48 hours prior to surgery.   You may shave your face before/day of surgery.  Place clean sheets on your bed the night before surgery Use a clean washcloth (not used since being washed) for each shower. DO NOT sleep with pet's night before surgery.  CHG Shower Instructions:  Wash your face and private area with normal soap. If you choose to wash your hair, wash first with your normal shampoo.  After you use shampoo/soap, rinse your hair and body thoroughly to remove  shampoo/soap residue.  Turn the water OFF and apply half the bottle of CHG soap to a CLEAN washcloth.  Apply CHG soap ONLY FROM YOUR NECK DOWN TO YOUR TOES (washing for 3-5 minutes)  DO NOT use CHG soap on face, private areas, open wounds, or sores.  Pay special attention to the area where your surgery is being performed.  If you are having back surgery, having someone wash your back for you may be helpful. Wait 2 minutes after CHG soap is applied, then you may rinse off the CHG soap.  Pat dry with a clean towel  Put on clean pajamas    Additional instructions for the day of surgery: DO NOT APPLY any lotions, deodorants, cologne, or perfumes.   Do not wear jewelry or makeup Do not wear nail polish, gel polish, artificial nails, or any other type of covering on natural nails (fingers and toes) Do not bring valuables to the hospital. [facility] is not responsible for valuables/personal belongings. Put on clean/comfortable clothes.  Please brush your teeth.  Ask your nurse before applying any prescription medications to the skin.

## 2024-02-02 LAB — ACID FAST CULTURE WITH REFLEXED SENSITIVITIES (MYCOBACTERIA): Acid Fast Culture: NEGATIVE

## 2024-02-03 ENCOUNTER — Inpatient Hospital Stay (HOSPITAL_COMMUNITY)
Admission: RE | Admit: 2024-02-03 | Discharge: 2024-02-03 | Disposition: A | Discharge status: Home / Self Care | Source: Ambulatory Visit | Attending: Vascular Surgery

## 2024-02-03 ENCOUNTER — Encounter (HOSPITAL_COMMUNITY): Payer: Self-pay

## 2024-02-03 ENCOUNTER — Other Ambulatory Visit: Payer: Self-pay

## 2024-02-03 VITALS — BP 140/80 | HR 89 | Temp 98.3°F | Resp 17 | Ht 63.0 in | Wt 163.8 lb

## 2024-02-03 DIAGNOSIS — I10 Essential (primary) hypertension: Secondary | ICD-10-CM | POA: Insufficient documentation

## 2024-02-03 DIAGNOSIS — E785 Hyperlipidemia, unspecified: Secondary | ICD-10-CM | POA: Insufficient documentation

## 2024-02-03 DIAGNOSIS — Z01818 Encounter for other preprocedural examination: Secondary | ICD-10-CM

## 2024-02-03 DIAGNOSIS — I2510 Atherosclerotic heart disease of native coronary artery without angina pectoris: Secondary | ICD-10-CM | POA: Diagnosis not present

## 2024-02-03 DIAGNOSIS — I6523 Occlusion and stenosis of bilateral carotid arteries: Secondary | ICD-10-CM | POA: Diagnosis not present

## 2024-02-03 DIAGNOSIS — Z01812 Encounter for preprocedural laboratory examination: Secondary | ICD-10-CM | POA: Diagnosis not present

## 2024-02-03 DIAGNOSIS — J439 Emphysema, unspecified: Secondary | ICD-10-CM | POA: Diagnosis not present

## 2024-02-03 DIAGNOSIS — Z87891 Personal history of nicotine dependence: Secondary | ICD-10-CM | POA: Insufficient documentation

## 2024-02-03 LAB — COMPREHENSIVE METABOLIC PANEL WITH GFR
ALT: 20 U/L (ref 0–44)
AST: 29 U/L (ref 15–41)
Albumin: 3.7 g/dL (ref 3.5–5.0)
Alkaline Phosphatase: 86 U/L (ref 38–126)
Anion gap: 10 (ref 5–15)
BUN: 6 mg/dL — ABNORMAL LOW (ref 8–23)
CO2: 25 mmol/L (ref 22–32)
Calcium: 9.3 mg/dL (ref 8.9–10.3)
Chloride: 96 mmol/L — ABNORMAL LOW (ref 98–111)
Creatinine, Ser: 0.61 mg/dL (ref 0.44–1.00)
GFR, Estimated: >60 mL/min (ref >60)
Glucose, Bld: 92 mg/dL (ref 70–99)
Potassium: 3.9 mmol/L (ref 3.5–5.1)
Sodium: 131 mmol/L — ABNORMAL LOW (ref 135–145)
Total Bilirubin: 0.7 mg/dL (ref 0.0–1.2)
Total Protein: 7.4 g/dL (ref 6.5–8.1)

## 2024-02-03 LAB — URINALYSIS, ROUTINE W REFLEX MICROSCOPIC
Bilirubin Urine: NEGATIVE
Glucose, UA: NEGATIVE mg/dL
Hgb urine dipstick: NEGATIVE
Ketones, ur: NEGATIVE mg/dL
Leukocytes,Ua: NEGATIVE
Nitrite: NEGATIVE
Protein, ur: NEGATIVE mg/dL
Specific Gravity, Urine: 1.014 (ref 1.005–1.030)
pH: 5 (ref 5.0–8.0)

## 2024-02-03 LAB — PROTIME-INR
INR: 1 (ref 0.8–1.2)
Prothrombin Time: 13.5 s (ref 11.4–15.2)

## 2024-02-03 LAB — CBC
HCT: 50.4 % — ABNORMAL HIGH (ref 36.0–46.0)
Hemoglobin: 16.7 g/dL — ABNORMAL HIGH (ref 12.0–15.0)
MCH: 30.9 pg (ref 26.0–34.0)
MCHC: 33.1 g/dL (ref 30.0–36.0)
MCV: 93.2 fL (ref 80.0–100.0)
Platelets: 336 10*3/uL (ref 150–400)
RBC: 5.41 MIL/uL — ABNORMAL HIGH (ref 3.87–5.11)
RDW: 19.9 % — ABNORMAL HIGH (ref 11.5–15.5)
WBC: 9.8 10*3/uL (ref 4.0–10.5)
nRBC: 0 % (ref 0.0–0.2)

## 2024-02-03 LAB — TYPE AND SCREEN
ABO/RH(D): O POS
Antibody Screen: NEGATIVE

## 2024-02-03 LAB — APTT: aPTT: 34 s (ref 24–36)

## 2024-02-03 LAB — SURGICAL PCR SCREEN
MRSA, PCR: NEGATIVE
Staphylococcus aureus: NEGATIVE

## 2024-02-03 NOTE — Progress Notes (Signed)
 PCP - Comer Gaskins, NP Cardiologist - Dr Aleene Passe (clearance on 12/06/23)  Chest x-ray - n/a EKG - 10/29/23 Stress Test - n/a ECHO - 10/15/23 Cardiac Cath - n/a  ICD Pacemaker/Loop - n/a  Sleep Study -  n/a CPAP - none  Diabetes - n/a.  Blood Thinner Instructions:  n/a  Aspirin  Instructions: Continue  NPO  Anesthesia review: Yes  STOP now taking any Aspirin  (unless otherwise instructed by your surgeon), Aleve, Naproxen, Ibuprofen, Motrin, Advil, Goody's, BC's, all herbal medications, fish oil, and all vitamins.   Coronavirus Screening Do you have any of the following symptoms:  Cough yes/no: No Fever (>100.59F)  yes/no: No Runny nose yes/no: No Sore throat yes/no: No Difficulty breathing/shortness of breath  yes/no: No  Have you traveled in the last 14 days and where? yes/no: No  Patient verbalized understanding of instructions that were given to them at the PAT appointment. Patient was also instructed that they will need to review over the PAT instructions again at home before surgery.

## 2024-02-04 NOTE — Progress Notes (Signed)
 Anesthesia Chart Review:  63 year old female follows with cardiology for history of Gold stage IIb COPD and emphysema.  Former smoker (quit 05/2023).  She is maintained on Wixela Inhub and as needed albuterol .  She also has a left lower lobe pulmonary nodule which was recently biopsied on 12/20/2023 with no malignancy found.  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  Echo 10/2023 showed hyperdynamic LVEF 70 to 75%, grade 1 DD, normal RV, moderate aortic stenosis with mean gradient 25.7 mmHg.  Recently preop evaluation by Denise West, NP on 12/06/2023.  Per note, Preoperative Cardiovascular Risk Assessment: robotic assisted navigational bronchoscopy on 12/10/2023. Denise [name]'s perioperative risk of a major cardiac event is 0.4% according to the Revised Cardiac Risk Index (RCRI).  Therefore, she is at low risk for perioperative complications.   Her functional capacity is fair at 5.07 METs according to the Duke Activity Status Index (DASI). Recommendations: According to ACC/AHA guidelines, no further cardiovascular testing needed.  The patient may proceed to surgery at acceptable risk. Antiplatelet and/or Anticoagulation Recommendations: From a cardiac standpoint patient can hold aspirin  81 mg daily for 5-7 days prior to procedure however aspirin  is not prescribed by cardiology office, final recommendations should come from prescribing office.  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  Other pertinent history includes GERD on H2 blocker, SIADH.  Preop labs reviewed, mild hyponatremia with sodium 131, otherwise unremarkable.  EKG 10/29/2023: Sinus rhythm with Premature atrial complexes.  Rate 95. Low voltage QRS. Cannot rule out Anterior infarct , age undetermined.  CT super D chest 12/05/2023: IMPRESSION: 1. Imaging for bronchoscopy planning and guidance. 2. Known lobulated solid left  lower lobe pulmonary nodule has mildly enlarged from the 2022 CT and PET CT, but is unchanged from more recent CT. 3. No evidence of metastatic disease. 4. Small right pleural effusion with mild residual atelectasis at both lung bases, right greater than left. 5. Resolution of the right pneumothorax and extensive chest wall emphysema seen on the most recent chest CT. Multiple healing subacute right-sided rib fractures. 6. Aortic Atherosclerosis (ICD10-I70.0) and Emphysema (ICD10-J43.9).  TTE 10/15/2023: 1. Left ventricular ejection fraction, by estimation, is 70 to 75%. The  left ventricle has hyperdynamic function. The left ventricle has no  regional wall motion abnormalities. There is moderate concentric left  ventricular hypertrophy. Left ventricular  diastolic parameters are consistent with Grade I diastolic dysfunction  (impaired relaxation).   2. Right ventricular systolic function is normal. The right ventricular  size is normal.   3. Left atrial size was moderately dilated.   4. The mitral valve is normal in structure. Trivial mitral valve  regurgitation. No evidence of mitral stenosis.   5. The aortic valve is tricuspid. There is moderate calcification of the  aortic valve. Aortic valve regurgitation is mild. Moderate aortic valve  stenosis. Aortic regurgitation PHT measures 404 msec. Aortic valve area,  by VTI measures 1.21 cm. Aortic  valve mean gradient measures 25.7 mmHg. Aortic valve Vmax measures 3.54  m/s.   6. The inferior vena cava is normal in size with greater than 50%  respiratory variability, suggesting right atrial pressure of 3 mmHg.    Coronary CTA with FFR analysis 10/13/2023: FINDINGS: 1. Left Main: 1.0; low likelihood of hemodynamic significance.   2. Prox LAD: 0.87; low likelihood of hemodynamic significance. 3. Mid LAD: 0.85; Low likelihood of hemodynamic significance. 4. Distal LAD:  0.79; Low likelihood of hemodynamic significance (tapering  artifact). 5. D1: 0.96; Low likelihood of hemodynamic significance. 6. LCX: 0.98; low likelihood of hemodynamic significance. 7. RCA: 0.94; low likelihood of hemodynamic significance.   IMPRESSION:   1.  Proximal LAD is negative by CT FFR.     Lynwood Geofm RIGGERS [facility]/Anesthesiology Phone [phone] 02/04/2024 1:35 PM

## 2024-02-04 NOTE — Anesthesia Preprocedure Evaluation (Addendum)
 Anesthesia Evaluation  Patient identified by MRN, date of birth, ID band Patient awake    Reviewed: Allergy & Precautions, NPO status , Patient's Chart, lab work & pertinent test results  Airway Mallampati: I  TM Distance: >3 FB Neck ROM: Full    Dental  (+) Dental Advisory Given, Edentulous Upper, Edentulous Lower   Pulmonary COPD, former smoker   breath sounds clear to auscultation       Cardiovascular hypertension, Pt. on medications + CAD and + Peripheral Vascular Disease   Rhythm:Regular Rate:Normal  Echo:  1. Left ventricular ejection fraction, by estimation, is 70 to 75%. The  left ventricle has hyperdynamic function. The left ventricle has no  regional wall motion abnormalities. There is moderate concentric left  ventricular hypertrophy. Left ventricular  diastolic parameters are consistent with Grade I diastolic dysfunction  (impaired relaxation).   2. Right ventricular systolic function is normal. The right ventricular  size is normal.   3. Left atrial size was moderately dilated.   4. The mitral valve is normal in structure. Trivial mitral valve  regurgitation. No evidence of mitral stenosis.   5. The aortic valve is tricuspid. There is moderate calcification of the  aortic valve. Aortic valve regurgitation is mild. Moderate aortic valve  stenosis. Aortic regurgitation PHT measures 404 msec. Aortic valve area,  by VTI measures 1.21 cm. Aortic  valve mean gradient measures 25.7 mmHg. Aortic valve Vmax measures 3.54  m/s.   6. The inferior vena cava is normal in size with greater than 50%  respiratory variability, suggesting right atrial pressure of 3 mmHg.     Neuro/Psych  Headaches PSYCHIATRIC DISORDERS Anxiety Depression     Neuromuscular disease    GI/Hepatic Neg liver ROS,GERD  Medicated,,  Endo/Other  negative endocrine ROS    Renal/GU negative Renal ROS     Musculoskeletal  (+) Arthritis ,     Abdominal   Peds  Hematology  (+) Blood dyscrasia, anemia   Anesthesia Other Findings   Reproductive/Obstetrics                              Anesthesia Physical Anesthesia Plan  ASA: 3  Anesthesia Plan: General   Post-op Pain Management: Tylenol  PO (pre-op)*   Induction: Intravenous  PONV Risk Score and Plan: 4 or greater and Ondansetron , Dexamethasone , Midazolam  and Treatment may vary due to age or medical condition  Airway Management Planned: Oral ETT  Additional Equipment: Arterial line  Intra-op Plan:   Post-operative Plan: Extubation in OR  Informed Consent: I have reviewed the patients History and Physical, chart, labs and discussed the procedure including the risks, benefits and alternatives for the proposed anesthesia with the patient or authorized representative who has indicated his/her understanding and acceptance.     Dental advisory given  Plan Discussed with: CRNA  Anesthesia Plan Comments: (PAT note by Lynwood Hope, PA-C: 63 year old female follows with cardiology for history of Gold stage IIb COPD and emphysema.  Former smoker (quit 05/2023).  She is maintained on Wixela Inhub and as needed albuterol .  She also has a left lower lobe pulmonary nodule which was recently biopsied on 12/20/2023 with no malignancy found.  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  Echo 10/2023 showed hyperdynamic LVEF 70 to 75%, grade 1 DD, normal RV, moderate aortic stenosis with mean gradient 25.7 mmHg.  Recently preop evaluation by Katlyn West, NP on  12/06/2023.  Per note, Preoperative Cardiovascular Risk Assessment: robotic assisted navigational bronchoscopy on 12/10/2023. Ms. Guthmiller's perioperative risk of a major cardiac event is 0.4% according to the Revised Cardiac Risk Index (RCRI).  Therefore, she is at low risk for perioperative complications.   Her functional capacity is fair at 5.07  METs according to the Duke Activity Status Index (DASI). Recommendations: According to ACC/AHA guidelines, no further cardiovascular testing needed.  The patient may proceed to surgery at acceptable risk. Antiplatelet and/or Anticoagulation Recommendations: From a cardiac standpoint patient can hold aspirin  81 mg daily for 5-7 days prior to procedure however aspirin  is not prescribed by cardiology office, final recommendations should come from prescribing office.  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  Other pertinent history includes GERD on H2 blocker, SIADH.  Preop labs reviewed, mild hyponatremia with sodium 131, otherwise unremarkable.  EKG 10/29/2023: Sinus rhythm with Premature atrial complexes.  Rate 95. Low voltage QRS. Cannot rule out Anterior infarct , age undetermined.  CT super D chest 12/05/2023: IMPRESSION: 1. Imaging for bronchoscopy planning and guidance. 2. Known lobulated solid left lower lobe pulmonary nodule has mildly enlarged from the 2022 CT and PET CT, but is unchanged from more recent CT. 3. No evidence of metastatic disease. 4. Small right pleural effusion with mild residual atelectasis at both lung bases, right greater than left. 5. Resolution of the right pneumothorax and extensive chest wall emphysema seen on the most recent chest CT. Multiple healing subacute right-sided rib fractures. 6. Aortic Atherosclerosis (ICD10-I70.0) and Emphysema (ICD10-J43.9).  TTE 10/15/2023: 1. Left ventricular ejection fraction, by estimation, is 70 to 75%. The  left ventricle has hyperdynamic function. The left ventricle has no  regional wall motion abnormalities. There is moderate concentric left  ventricular hypertrophy. Left ventricular  diastolic parameters are consistent with Grade I diastolic dysfunction  (impaired relaxation).  2. Right ventricular systolic function is normal. The right ventricular  size  is normal.  3. Left atrial size was moderately dilated.  4. The mitral valve is normal in structure. Trivial mitral valve  regurgitation. No evidence of mitral stenosis.  5. The aortic valve is tricuspid. There is moderate calcification of the  aortic valve. Aortic valve regurgitation is mild. Moderate aortic valve  stenosis. Aortic regurgitation PHT measures 404 msec. Aortic valve area,  by VTI measures 1.21 cm. Aortic  valve mean gradient measures 25.7 mmHg. Aortic valve Vmax measures 3.54  m/s.  6. The inferior vena cava is normal in size with greater than 50%  respiratory variability, suggesting right atrial pressure of 3 mmHg.    Coronary CTA with FFR analysis 10/13/2023: FINDINGS: 1. Left Main: 1.0; low likelihood of hemodynamic significance.  2. Prox LAD: 0.87; low likelihood of hemodynamic significance. 3. Mid LAD: 0.85; Low likelihood of hemodynamic significance. 4. Distal LAD: 0.79; Low likelihood of hemodynamic significance (tapering artifact). 5. D1: 0.96; Low likelihood of hemodynamic significance. 6. LCX: 0.98; low likelihood of hemodynamic significance. 7. RCA: 0.94; low likelihood of hemodynamic significance.  IMPRESSION:  1.  Proximal LAD is negative by CT FFR.  )         Anesthesia Quick Evaluation

## 2024-02-10 ENCOUNTER — Other Ambulatory Visit (INDEPENDENT_AMBULATORY_CARE_PROVIDER_SITE_OTHER): Payer: Self-pay | Admitting: Vascular Surgery

## 2024-02-10 ENCOUNTER — Telehealth (INDEPENDENT_AMBULATORY_CARE_PROVIDER_SITE_OTHER): Payer: Self-pay | Admitting: Vascular Surgery

## 2024-02-10 MED ORDER — CLOPIDOGREL BISULFATE 75 MG PO TABS: 75.0000 mg | ORAL_TABLET | Freq: Every day | ORAL | 6 refills | Status: DC

## 2024-02-10 NOTE — Telephone Encounter (Signed)
 She was scheduled for carotid endarterectomy on the right earlier this year and this was canceled after she had trauma with mechanical fall and broke multiple ribs.  She was having blurry vision in her right eye in April/May 2024 and evaluated with ophthalmology and she reports findings consistent with diabetic retinopathy although she has no history of diabetes.  As part of this workup she had a carotid ultrasound on 04/23/2023 showing a high-grade 70 to 99% left ICA stenosis and there is also suggestion of a possible high-grade contralateral stenosis on the right but difficult to establish with significant anterior wall plaque with shadowing.  Ultimately I sent her for CTA neck showing bilateral high-grade ICA stenosis.  I previously discussed the focus to be on the right side and had recommended a right carotid endarterectomy due to significant calcification with near occlusive stenosis.   Since recent evaluation in the office, I recommended a repeat carotid ultrasound as had been some interval time since last evaluation.  Duplex shows her right carotid is now occluded.  I discussed there is no role for revascularization of her occluded artery as this was a silent occlusion.  Focus is now on the left side.  The left side has significantly less calcium  but still a high grade stenosis.  I reviewed her images.  Discussed she would be a candidate for TCAR or carotid endarterectomy on the left.  She favors the more minimally endovascular approach.  Discussed stenting with flow reversal using TCAR as an option.  I did send Plavix  to her pharmacy.  I will see if we can get her posting for Wednesday updated to a left TCAR for stroke risk reduction.  Discussed 1% stroke risk.  Lonni DOROTHA Gaskins, MD [facility] Office: [phone]   Lonni JINNY Gaskins

## 2024-02-12 ENCOUNTER — Inpatient Hospital Stay (HOSPITAL_COMMUNITY): Payer: Self-pay | Admitting: Physician Assistant

## 2024-02-12 ENCOUNTER — Encounter (HOSPITAL_COMMUNITY)
Admission: RE | Disposition: A | Discharge status: Home / Self Care | Payer: Self-pay | Source: Home / Self Care | Attending: Vascular Surgery

## 2024-02-12 ENCOUNTER — Inpatient Hospital Stay (HOSPITAL_COMMUNITY): Payer: Self-pay | Admitting: Anesthesiology

## 2024-02-12 ENCOUNTER — Other Ambulatory Visit: Payer: Self-pay

## 2024-02-12 ENCOUNTER — Inpatient Hospital Stay (HOSPITAL_COMMUNITY)

## 2024-02-12 ENCOUNTER — Encounter (HOSPITAL_COMMUNITY): Payer: Self-pay | Admitting: Vascular Surgery

## 2024-02-12 ENCOUNTER — Inpatient Hospital Stay (HOSPITAL_COMMUNITY)
Admission: RE | Admit: 2024-02-12 | Discharge: 2024-02-14 | DRG: 036 | Disposition: A | Discharge status: Home / Self Care | Attending: Vascular Surgery | Admitting: Vascular Surgery

## 2024-02-12 DIAGNOSIS — I10 Essential (primary) hypertension: Secondary | ICD-10-CM | POA: Diagnosis present

## 2024-02-12 DIAGNOSIS — Z79899 Other long term (current) drug therapy: Secondary | ICD-10-CM | POA: Diagnosis not present

## 2024-02-12 DIAGNOSIS — J432 Centrilobular emphysema: Secondary | ICD-10-CM | POA: Diagnosis present

## 2024-02-12 DIAGNOSIS — I6522 Occlusion and stenosis of left carotid artery: Principal | ICD-10-CM

## 2024-02-12 DIAGNOSIS — I4891 Unspecified atrial fibrillation: Secondary | ICD-10-CM | POA: Diagnosis not present

## 2024-02-12 DIAGNOSIS — I959 Hypotension, unspecified: Secondary | ICD-10-CM | POA: Diagnosis not present

## 2024-02-12 DIAGNOSIS — E785 Hyperlipidemia, unspecified: Secondary | ICD-10-CM | POA: Diagnosis present

## 2024-02-12 DIAGNOSIS — I493 Ventricular premature depolarization: Secondary | ICD-10-CM | POA: Diagnosis not present

## 2024-02-12 DIAGNOSIS — R008 Other abnormalities of heart beat: Secondary | ICD-10-CM | POA: Diagnosis not present

## 2024-02-12 DIAGNOSIS — Z5941 Food insecurity: Secondary | ICD-10-CM | POA: Diagnosis not present

## 2024-02-12 DIAGNOSIS — Z87891 Personal history of nicotine dependence: Secondary | ICD-10-CM | POA: Diagnosis not present

## 2024-02-12 DIAGNOSIS — I350 Nonrheumatic aortic (valve) stenosis: Secondary | ICD-10-CM

## 2024-02-12 DIAGNOSIS — I6523 Occlusion and stenosis of bilateral carotid arteries: Principal | ICD-10-CM | POA: Diagnosis present

## 2024-02-12 DIAGNOSIS — I352 Nonrheumatic aortic (valve) stenosis with insufficiency: Secondary | ICD-10-CM | POA: Diagnosis present

## 2024-02-12 DIAGNOSIS — E669 Obesity, unspecified: Secondary | ICD-10-CM | POA: Diagnosis present

## 2024-02-12 DIAGNOSIS — I739 Peripheral vascular disease, unspecified: Secondary | ICD-10-CM | POA: Diagnosis present

## 2024-02-12 DIAGNOSIS — I491 Atrial premature depolarization: Secondary | ICD-10-CM | POA: Diagnosis not present

## 2024-02-12 DIAGNOSIS — Z6828 Body mass index (BMI) 28.0-28.9, adult: Secondary | ICD-10-CM

## 2024-02-12 DIAGNOSIS — I480 Paroxysmal atrial fibrillation: Secondary | ICD-10-CM | POA: Diagnosis not present

## 2024-02-12 DIAGNOSIS — I2510 Atherosclerotic heart disease of native coronary artery without angina pectoris: Secondary | ICD-10-CM

## 2024-02-12 DIAGNOSIS — Z5982 Transportation insecurity: Secondary | ICD-10-CM

## 2024-02-12 DIAGNOSIS — D6869 Other thrombophilia: Secondary | ICD-10-CM | POA: Diagnosis not present

## 2024-02-12 HISTORY — DX: Nonrheumatic aortic (valve) insufficiency: I35.1

## 2024-02-12 HISTORY — PX: TRANSCAROTID ARTERY REVASCULARIZATIONÂ: SHX6778

## 2024-02-12 LAB — BASIC METABOLIC PANEL WITH GFR
Anion gap: 11 (ref 5–15)
BUN: 8 mg/dL (ref 8–23)
CO2: 21 mmol/L — ABNORMAL LOW (ref 22–32)
Calcium: 8.6 mg/dL — ABNORMAL LOW (ref 8.9–10.3)
Chloride: 101 mmol/L (ref 98–111)
Creatinine, Ser: 0.72 mg/dL (ref 0.44–1.00)
GFR, Estimated: >60 mL/min (ref >60)
Glucose, Bld: 132 mg/dL — ABNORMAL HIGH (ref 70–99)
Potassium: 4.3 mmol/L (ref 3.5–5.1)
Sodium: 133 mmol/L — ABNORMAL LOW (ref 135–145)

## 2024-02-12 LAB — CBC
HCT: 40.8 % (ref 36.0–46.0)
Hemoglobin: 13.8 g/dL (ref 12.0–15.0)
MCH: 31.3 pg (ref 26.0–34.0)
MCHC: 33.8 g/dL (ref 30.0–36.0)
MCV: 92.5 fL (ref 80.0–100.0)
Platelets: 249 K/uL (ref 150–400)
RBC: 4.41 MIL/uL (ref 3.87–5.11)
RDW: 20.3 % — ABNORMAL HIGH (ref 11.5–15.5)
WBC: 11.1 K/uL — ABNORMAL HIGH (ref 4.0–10.5)
nRBC: 0 % (ref 0.0–0.2)

## 2024-02-12 LAB — POCT ACTIVATED CLOTTING TIME: Activated Clotting Time: 360 s

## 2024-02-12 LAB — MAGNESIUM: Magnesium: 1.7 mg/dL (ref 1.7–2.4)

## 2024-02-12 LAB — TSH: TSH: 0.34 u[IU]/mL — ABNORMAL LOW (ref 0.350–4.500)

## 2024-02-12 SURGERY — TRANSCAROTID ARTERY REVASCULARIZATION (TCAR)
Anesthesia: General | Laterality: Left

## 2024-02-12 MED ORDER — HEPARIN 6000 UNIT IRRIGATION SOLUTION
Status: DC | PRN
  Administered 2024-02-12: 1

## 2024-02-12 MED ORDER — ALBUTEROL SULFATE (2.5 MG/3ML) 0.083% IN NEBU
INHALATION_SOLUTION | RESPIRATORY_TRACT | Status: AC
  Administered 2024-02-12: 2.5 mg via RESPIRATORY_TRACT
  Filled 2024-02-12: qty 3

## 2024-02-12 MED ORDER — LISINOPRIL 20 MG PO TABS
30.0000 mg | ORAL_TABLET | Freq: Every day | ORAL | Status: DC
  Administered 2024-02-13: 30 mg via ORAL
  Filled 2024-02-12 (×2): qty 1

## 2024-02-12 MED ORDER — ALBUMIN HUMAN 5 % IV SOLN: INTRAVENOUS | Status: DC | PRN

## 2024-02-12 MED ORDER — OXYCODONE HCL 5 MG/5ML PO SOLN: 5.0000 mg | Freq: Once | ORAL | Status: AC | PRN

## 2024-02-12 MED ORDER — ESCITALOPRAM OXALATE 20 MG PO TABS
20.0000 mg | ORAL_TABLET | Freq: Every day | ORAL | Status: DC
  Administered 2024-02-13 – 2024-02-14 (×2): 20 mg via ORAL
  Filled 2024-02-12 (×2): qty 1

## 2024-02-12 MED ORDER — VASOPRESSIN 20 UNIT/ML IV SOLN
INTRAVENOUS | Status: AC
  Filled 2024-02-12: qty 1

## 2024-02-12 MED ORDER — PSEUDOEPHEDRINE HCL 30 MG PO TABS
30.0000 mg | ORAL_TABLET | Freq: Once | ORAL | Status: AC
  Administered 2024-02-12: 30 mg via ORAL
  Filled 2024-02-12: qty 1

## 2024-02-12 MED ORDER — HEMOSTATIC AGENTS (NO CHARGE) OPTIME
TOPICAL | Status: DC | PRN
  Administered 2024-02-12: 1 via TOPICAL

## 2024-02-12 MED ORDER — ALBUTEROL SULFATE (2.5 MG/3ML) 0.083% IN NEBU
2.5000 mg | INHALATION_SOLUTION | RESPIRATORY_TRACT | Status: DC | PRN
  Administered 2024-02-12 – 2024-02-13 (×4): 2.5 mg via RESPIRATORY_TRACT
  Filled 2024-02-12 (×4): qty 3

## 2024-02-12 MED ORDER — POLYETHYLENE GLYCOL 3350 17 G PO PACK: 17.0000 g | PACK | Freq: Every day | ORAL | Status: DC | PRN

## 2024-02-12 MED ORDER — PHENOL 1.4 % MT LIQD
1.0000 | OROMUCOSAL | Status: DC | PRN
  Administered 2024-02-13: 1 via OROMUCOSAL
  Filled 2024-02-12: qty 177

## 2024-02-12 MED ORDER — ONDANSETRON HCL 4 MG/2ML IJ SOLN
INTRAMUSCULAR | Status: AC
  Filled 2024-02-12: qty 2

## 2024-02-12 MED ORDER — FENTANYL CITRATE (PF) 100 MCG/2ML IJ SOLN
INTRAMUSCULAR | Status: AC
  Filled 2024-02-12: qty 2

## 2024-02-12 MED ORDER — AMLODIPINE BESYLATE 10 MG PO TABS
10.0000 mg | ORAL_TABLET | Freq: Every day | ORAL | Status: DC
  Administered 2024-02-13: 10 mg via ORAL
  Filled 2024-02-12 (×2): qty 1

## 2024-02-12 MED ORDER — ROCURONIUM BROMIDE 10 MG/ML (PF) SYRINGE
PREFILLED_SYRINGE | INTRAVENOUS | Status: DC | PRN
  Administered 2024-02-12: 10 mg via INTRAVENOUS
  Administered 2024-02-12: 50 mg via INTRAVENOUS

## 2024-02-12 MED ORDER — OXYCODONE HCL 5 MG PO TABS
ORAL_TABLET | ORAL | Status: AC
  Filled 2024-02-12: qty 1

## 2024-02-12 MED ORDER — CLOPIDOGREL BISULFATE 75 MG PO TABS
75.0000 mg | ORAL_TABLET | Freq: Every day | ORAL | Status: DC
  Administered 2024-02-13 – 2024-02-14 (×2): 75 mg via ORAL
  Filled 2024-02-12 (×2): qty 1

## 2024-02-12 MED ORDER — FERROUS SULFATE 325 (65 FE) MG PO TABS
325.0000 mg | ORAL_TABLET | Freq: Every day | ORAL | Status: DC
  Administered 2024-02-13 – 2024-02-14 (×2): 325 mg via ORAL
  Filled 2024-02-12 (×2): qty 1

## 2024-02-12 MED ORDER — CHLORHEXIDINE GLUCONATE CLOTH 2 % EX PADS: 6.0000 | MEDICATED_PAD | Freq: Once | CUTANEOUS | Status: DC

## 2024-02-12 MED ORDER — PHENYLEPHRINE HCL-NACL 20-0.9 MG/250ML-% IV SOLN
INTRAVENOUS | Status: DC | PRN
  Administered 2024-02-12: 25 ug/min via INTRAVENOUS

## 2024-02-12 MED ORDER — LORATADINE 10 MG PO TABS
10.0000 mg | ORAL_TABLET | Freq: Every day | ORAL | Status: DC
  Administered 2024-02-12 – 2024-02-14 (×3): 10 mg via ORAL
  Filled 2024-02-12 (×3): qty 1

## 2024-02-12 MED ORDER — ACETAMINOPHEN 325 MG PO TABS
325.0000 mg | ORAL_TABLET | ORAL | Status: DC | PRN
  Administered 2024-02-13: 325 mg via ORAL
  Filled 2024-02-12: qty 1

## 2024-02-12 MED ORDER — LIDOCAINE HCL (PF) 1 % IJ SOLN
INTRAMUSCULAR | Status: AC
  Filled 2024-02-12: qty 30

## 2024-02-12 MED ORDER — FENTANYL CITRATE (PF) 250 MCG/5ML IJ SOLN
INTRAMUSCULAR | Status: AC
  Filled 2024-02-12: qty 5

## 2024-02-12 MED ORDER — ALBUMIN HUMAN 5 % IV SOLN
12.5000 g | Freq: Once | INTRAVENOUS | Status: AC
  Administered 2024-02-12: 12.5 g via INTRAVENOUS

## 2024-02-12 MED ORDER — CHLORHEXIDINE GLUCONATE 0.12 % MT SOLN
15.0000 mL | Freq: Once | OROMUCOSAL | Status: AC
  Administered 2024-02-12: 15 mL via OROMUCOSAL
  Filled 2024-02-12: qty 15

## 2024-02-12 MED ORDER — CLEVIDIPINE BUTYRATE 0.5 MG/ML IV EMUL
INTRAVENOUS | Status: DC | PRN
  Administered 2024-02-12: 2 mg/h via INTRAVENOUS

## 2024-02-12 MED ORDER — MIDAZOLAM HCL 2 MG/2ML IJ SOLN
INTRAMUSCULAR | Status: AC
  Filled 2024-02-12: qty 2

## 2024-02-12 MED ORDER — SUGAMMADEX SODIUM 200 MG/2ML IV SOLN
INTRAVENOUS | Status: DC | PRN
  Administered 2024-02-12: 200 mg via INTRAVENOUS

## 2024-02-12 MED ORDER — FENTANYL CITRATE (PF) 250 MCG/5ML IJ SOLN
INTRAMUSCULAR | Status: DC | PRN
  Administered 2024-02-12 (×2): 50 ug via INTRAVENOUS

## 2024-02-12 MED ORDER — CEFAZOLIN SODIUM-DEXTROSE 2-4 GM/100ML-% IV SOLN
2.0000 g | INTRAVENOUS | Status: AC
  Administered 2024-02-12: 2 g via INTRAVENOUS
  Filled 2024-02-12: qty 100

## 2024-02-12 MED ORDER — PROPOFOL 10 MG/ML IV BOLUS
INTRAVENOUS | Status: DC | PRN
  Administered 2024-02-12: 20 mg via INTRAVENOUS
  Administered 2024-02-12: 120 mg via INTRAVENOUS

## 2024-02-12 MED ORDER — ACETAMINOPHEN 10 MG/ML IV SOLN
INTRAVENOUS | Status: AC
  Filled 2024-02-12: qty 100

## 2024-02-12 MED ORDER — SODIUM CHLORIDE 0.9 % IV SOLN: INTRAVENOUS | Status: DC

## 2024-02-12 MED ORDER — ORAL CARE MOUTH RINSE: 15.0000 mL | Freq: Once | OROMUCOSAL | Status: AC

## 2024-02-12 MED ORDER — IPRATROPIUM-ALBUTEROL 0.5-2.5 (3) MG/3ML IN SOLN
RESPIRATORY_TRACT | Status: AC
  Filled 2024-02-12: qty 3

## 2024-02-12 MED ORDER — ONDANSETRON HCL 4 MG/2ML IJ SOLN: 4.0000 mg | Freq: Four times a day (QID) | INTRAMUSCULAR | Status: DC | PRN

## 2024-02-12 MED ORDER — ATORVASTATIN CALCIUM 80 MG PO TABS
80.0000 mg | ORAL_TABLET | Freq: Every day | ORAL | Status: DC
  Administered 2024-02-13 – 2024-02-14 (×2): 80 mg via ORAL
  Filled 2024-02-12 (×2): qty 1

## 2024-02-12 MED ORDER — IPRATROPIUM-ALBUTEROL 0.5-2.5 (3) MG/3ML IN SOLN
3.0000 mL | Freq: Once | RESPIRATORY_TRACT | Status: AC
  Administered 2024-02-12: 3 mL via RESPIRATORY_TRACT

## 2024-02-12 MED ORDER — ALBUTEROL SULFATE (2.5 MG/3ML) 0.083% IN NEBU: 2.5000 mg | INHALATION_SOLUTION | RESPIRATORY_TRACT | Status: AC

## 2024-02-12 MED ORDER — ACETAMINOPHEN 10 MG/ML IV SOLN
1000.0000 mg | Freq: Once | INTRAVENOUS | Status: DC | PRN
Start: 2024-02-12 — End: 2024-02-12

## 2024-02-12 MED ORDER — ALBUMIN HUMAN 5 % IV SOLN
INTRAVENOUS | Status: AC
  Filled 2024-02-12: qty 250

## 2024-02-12 MED ORDER — HEPARIN SODIUM (PORCINE) 1000 UNIT/ML IJ SOLN
INTRAMUSCULAR | Status: DC | PRN
  Administered 2024-02-12: 8000 [IU] via INTRAVENOUS

## 2024-02-12 MED ORDER — 0.9 % SODIUM CHLORIDE (POUR BTL) OPTIME
TOPICAL | Status: DC | PRN
  Administered 2024-02-12: 1000 mL

## 2024-02-12 MED ORDER — ONDANSETRON HCL 4 MG/2ML IJ SOLN
INTRAMUSCULAR | Status: DC | PRN
  Administered 2024-02-12: 4 mg via INTRAVENOUS

## 2024-02-12 MED ORDER — PROTAMINE SULFATE 10 MG/ML IV SOLN
INTRAVENOUS | Status: DC | PRN
  Administered 2024-02-12: 15 mg via INTRAVENOUS
  Administered 2024-02-12: 35 mg via INTRAVENOUS

## 2024-02-12 MED ORDER — FENTANYL CITRATE (PF) 100 MCG/2ML IJ SOLN
25.0000 ug | INTRAMUSCULAR | Status: DC | PRN
  Administered 2024-02-12: 25 ug via INTRAVENOUS

## 2024-02-12 MED ORDER — LACTATED RINGERS IV SOLN: INTRAVENOUS | Status: DC

## 2024-02-12 MED ORDER — DOCUSATE SODIUM 100 MG PO CAPS
100.0000 mg | ORAL_CAPSULE | Freq: Every day | ORAL | Status: DC
  Administered 2024-02-13 – 2024-02-14 (×2): 100 mg via ORAL
  Filled 2024-02-12 (×2): qty 1

## 2024-02-12 MED ORDER — BISACODYL 10 MG RE SUPP: 10.0000 mg | Freq: Every day | RECTAL | Status: DC | PRN

## 2024-02-12 MED ORDER — OXYCODONE HCL 5 MG PO TABS
5.0000 mg | ORAL_TABLET | Freq: Once | ORAL | Status: AC | PRN
  Administered 2024-02-12: 5 mg via ORAL

## 2024-02-12 MED ORDER — FLUTICASONE FUROATE-VILANTEROL 200-25 MCG/ACT IN AEPB
1.0000 | INHALATION_SPRAY | Freq: Every day | RESPIRATORY_TRACT | Status: DC
  Administered 2024-02-14: 1 via RESPIRATORY_TRACT
  Filled 2024-02-12: qty 28

## 2024-02-12 MED ORDER — MIDAZOLAM HCL 2 MG/2ML IJ SOLN
INTRAMUSCULAR | Status: DC | PRN
  Administered 2024-02-12: 1 mg via INTRAVENOUS

## 2024-02-12 MED ORDER — DROPERIDOL 2.5 MG/ML IJ SOLN: 0.6250 mg | Freq: Once | INTRAMUSCULAR | Status: DC | PRN

## 2024-02-12 MED ORDER — SUGAMMADEX SODIUM 200 MG/2ML IV SOLN
INTRAVENOUS | Status: AC
  Filled 2024-02-12: qty 2

## 2024-02-12 MED ORDER — BUSPIRONE HCL 5 MG PO TABS
5.0000 mg | ORAL_TABLET | Freq: Two times a day (BID) | ORAL | Status: DC
  Administered 2024-02-12 – 2024-02-14 (×4): 5 mg via ORAL
  Filled 2024-02-12 (×4): qty 1

## 2024-02-12 MED ORDER — POTASSIUM CHLORIDE CRYS ER 20 MEQ PO TBCR: 40.0000 meq | EXTENDED_RELEASE_TABLET | Freq: Every day | ORAL | Status: DC | PRN

## 2024-02-12 MED ORDER — SODIUM CHLORIDE 0.9 % IV SOLN: INTRAVENOUS | Status: AC

## 2024-02-12 MED ORDER — CEFAZOLIN SODIUM-DEXTROSE 2-4 GM/100ML-% IV SOLN
2.0000 g | Freq: Three times a day (TID) | INTRAVENOUS | Status: AC
  Administered 2024-02-12 (×2): 2 g via INTRAVENOUS
  Filled 2024-02-12 (×2): qty 100

## 2024-02-12 MED ORDER — PHENYLEPHRINE 80 MCG/ML (10ML) SYRINGE FOR IV PUSH (FOR BLOOD PRESSURE SUPPORT)
PREFILLED_SYRINGE | INTRAVENOUS | Status: AC
  Filled 2024-02-12: qty 20

## 2024-02-12 MED ORDER — LIDOCAINE 2% (20 MG/ML) 5 ML SYRINGE
INTRAMUSCULAR | Status: DC | PRN
  Administered 2024-02-12: 40 mg via INTRAVENOUS

## 2024-02-12 MED ORDER — PHENYLEPHRINE 80 MCG/ML (10ML) SYRINGE FOR IV PUSH (FOR BLOOD PRESSURE SUPPORT)
PREFILLED_SYRINGE | INTRAVENOUS | Status: DC | PRN
  Administered 2024-02-12: 120 ug via INTRAVENOUS
  Administered 2024-02-12: 80 ug via INTRAVENOUS
  Administered 2024-02-12 (×2): 120 ug via INTRAVENOUS
  Administered 2024-02-12: 80 ug via INTRAVENOUS
  Administered 2024-02-12: 40 ug via INTRAVENOUS
  Administered 2024-02-12 (×6): 80 ug via INTRAVENOUS

## 2024-02-12 MED ORDER — HEPARIN 6000 UNIT IRRIGATION SOLUTION
Status: AC
  Filled 2024-02-12: qty 500

## 2024-02-12 MED ORDER — IODIXANOL 320 MG/ML IV SOLN
INTRAVENOUS | Status: DC | PRN
Start: 2024-02-12 — End: 2024-02-12
  Administered 2024-02-12: 20 mL via INTRAVENOUS

## 2024-02-12 MED ORDER — LACTATED RINGERS IV SOLN: INTRAVENOUS | Status: DC | PRN

## 2024-02-12 MED ORDER — ASPIRIN 81 MG PO TBEC
162.0000 mg | DELAYED_RELEASE_TABLET | Freq: Every day | ORAL | Status: DC
  Administered 2024-02-13 – 2024-02-14 (×2): 162 mg via ORAL
  Filled 2024-02-12 (×2): qty 2

## 2024-02-12 MED ORDER — DEXAMETHASONE SODIUM PHOSPHATE 10 MG/ML IJ SOLN
INTRAMUSCULAR | Status: DC | PRN
  Administered 2024-02-12: 10 mg via INTRAVENOUS

## 2024-02-12 MED ORDER — ROCURONIUM BROMIDE 10 MG/ML (PF) SYRINGE
PREFILLED_SYRINGE | INTRAVENOUS | Status: AC
Start: 2024-02-12 — End: 2024-02-12
  Filled 2024-02-12: qty 10

## 2024-02-12 MED ORDER — SODIUM CHLORIDE 0.9 % IV SOLN
500.0000 mL | Freq: Once | INTRAVENOUS | Status: DC | PRN
Start: 2024-02-12 — End: 2024-02-14

## 2024-02-12 MED ORDER — ACETAMINOPHEN 10 MG/ML IV SOLN
1000.0000 mg | Freq: Once | INTRAVENOUS | Status: AC
  Administered 2024-02-12: 1000 mg via INTRAVENOUS

## 2024-02-12 MED ORDER — LABETALOL HCL 5 MG/ML IV SOLN: 10.0000 mg | INTRAVENOUS | Status: DC | PRN

## 2024-02-12 MED ORDER — PSEUDOEPHEDRINE HCL 30 MG PO TABS
30.0000 mg | ORAL_TABLET | Freq: Four times a day (QID) | ORAL | Status: DC
  Administered 2024-02-12 – 2024-02-13 (×3): 30 mg via ORAL
  Filled 2024-02-12 (×4): qty 1

## 2024-02-12 MED ORDER — DIPHENHYDRAMINE HCL 25 MG PO TABS
25.0000 mg | ORAL_TABLET | Freq: Four times a day (QID) | ORAL | Status: DC | PRN
  Filled 2024-02-12: qty 1

## 2024-02-12 MED ORDER — FAMOTIDINE 20 MG PO TABS
20.0000 mg | ORAL_TABLET | Freq: Every day | ORAL | Status: DC
Start: 2024-02-13 — End: 2024-02-14
  Administered 2024-02-13 – 2024-02-14 (×2): 20 mg via ORAL
  Filled 2024-02-12 (×2): qty 1

## 2024-02-12 MED ORDER — LIDOCAINE 2% (20 MG/ML) 5 ML SYRINGE
INTRAMUSCULAR | Status: AC
Start: 2024-02-12 — End: 2024-02-12
  Filled 2024-02-12: qty 5

## 2024-02-12 MED ORDER — METOPROLOL TARTRATE 5 MG/5ML IV SOLN
2.5000 mg | INTRAVENOUS | Status: DC | PRN
  Administered 2024-02-13: 5 mg via INTRAVENOUS
  Filled 2024-02-12: qty 5

## 2024-02-12 MED ORDER — MORPHINE SULFATE (PF) 2 MG/ML IV SOLN
2.0000 mg | INTRAVENOUS | Status: DC | PRN
  Administered 2024-02-12: 2 mg via INTRAVENOUS
  Filled 2024-02-12 (×2): qty 1

## 2024-02-12 MED ORDER — GLYCOPYRROLATE PF 0.2 MG/ML IJ SOSY
PREFILLED_SYRINGE | INTRAMUSCULAR | Status: DC | PRN
  Administered 2024-02-12 (×2): .1 mg via INTRAVENOUS

## 2024-02-12 MED ORDER — ACETAMINOPHEN 650 MG RE SUPP: 325.0000 mg | RECTAL | Status: DC | PRN

## 2024-02-12 MED ORDER — HYDRALAZINE HCL 20 MG/ML IJ SOLN: 5.0000 mg | INTRAMUSCULAR | Status: DC | PRN

## 2024-02-12 MED ORDER — OXYCODONE-ACETAMINOPHEN 5-325 MG PO TABS
1.0000 | ORAL_TABLET | ORAL | Status: DC | PRN
  Administered 2024-02-12: 1 via ORAL
  Administered 2024-02-13 (×2): 2 via ORAL
  Administered 2024-02-14: 1 via ORAL
  Administered 2024-02-14: 2 via ORAL
  Filled 2024-02-12 (×3): qty 1
  Filled 2024-02-12 (×2): qty 2
  Filled 2024-02-12: qty 1

## 2024-02-12 MED ORDER — DEXAMETHASONE SODIUM PHOSPHATE 10 MG/ML IJ SOLN
INTRAMUSCULAR | Status: AC
Start: 2024-02-12 — End: 2024-02-12
  Filled 2024-02-12: qty 1

## 2024-02-12 MED ORDER — MAGNESIUM SULFATE 2 GM/50ML IV SOLN
2.0000 g | Freq: Once | INTRAVENOUS | Status: AC
  Administered 2024-02-12: 2 g via INTRAVENOUS
  Filled 2024-02-12: qty 50

## 2024-02-12 MED ORDER — PROPOFOL 10 MG/ML IV BOLUS
INTRAVENOUS | Status: AC
  Filled 2024-02-12: qty 20

## 2024-02-12 SURGICAL SUPPLY — 39 items
BAG BANDED W/RUBBER/TAPE 36X54 (MISCELLANEOUS) ×1 IMPLANT
BAG COUNTER SPONGE SURGICOUNT (BAG) ×1 IMPLANT
CANISTER SUCTION 3000ML PPV (SUCTIONS) ×1 IMPLANT
CATH BALLN ENROUTE 5X35 (CATHETERS) IMPLANT
CATH BEACON 5 .035 40 KMP TP (CATHETERS) IMPLANT
CLIP TI MEDIUM 6 (CLIP) ×1 IMPLANT
CLIP TI WIDE RED SMALL 6 (CLIP) ×1 IMPLANT
COVER DOME SNAP 22 D (MISCELLANEOUS) ×1 IMPLANT
COVER PROBE W GEL 5X96 (DRAPES) ×1 IMPLANT
DERMABOND ADVANCED .7 DNX12 (GAUZE/BANDAGES/DRESSINGS) ×1 IMPLANT
DRAPE FEMORAL ANGIO 80X135IN (DRAPES) ×1 IMPLANT
ELECTRODE REM PT RTRN 9FT ADLT (ELECTROSURGICAL) ×1 IMPLANT
GLOVE BIO SURGEON STRL SZ7.5 (GLOVE) ×1 IMPLANT
GOWN STRL REUS W/ TWL LRG LVL3 (GOWN DISPOSABLE) ×2 IMPLANT
GOWN STRL REUS W/ TWL XL LVL3 (GOWN DISPOSABLE) ×1 IMPLANT
GUIDEWIRE ENROUTE 0.014 (WIRE) ×1 IMPLANT
HEMOSTAT SNOW SURGICEL 2X4 (HEMOSTASIS) IMPLANT
KIT BASIN OR (CUSTOM PROCEDURE TRAY) ×1 IMPLANT
KIT ENCORE 26 ADVANTAGE (KITS) ×1 IMPLANT
KIT INTRODUCER GALT 7 (INTRODUCER) ×1 IMPLANT
KIT TURNOVER KIT B (KITS) ×1 IMPLANT
NDL HYPO 25GX1X1/2 BEV (NEEDLE) IMPLANT
NEEDLE HYPO 25GX1X1/2 BEV (NEEDLE) IMPLANT
PACK CAROTID (CUSTOM PROCEDURE TRAY) ×1 IMPLANT
POSITIONER HEAD DONUT 9IN (MISCELLANEOUS) ×1 IMPLANT
SET MICROPUNCTURE 5F STIFF (MISCELLANEOUS) ×1 IMPLANT
STENT TRANSCAROTID 9-7X40 (Permanent Stent) IMPLANT
SUT MNCRL AB 4-0 PS2 18 (SUTURE) ×1 IMPLANT
SUT PROLENE 5 0 C 1 24 (SUTURE) ×1 IMPLANT
SUT SILK 2 0 PERMA HAND 18 BK (SUTURE) ×1 IMPLANT
SUT VIC AB 3-0 SH 27X BRD (SUTURE) ×1 IMPLANT
SYR 10ML LL (SYRINGE) ×3 IMPLANT
SYR 20ML LL LF (SYRINGE) ×1 IMPLANT
SYR CONTROL 10ML LL (SYRINGE) IMPLANT
SYSTEM ENROUTE TCAR NEURO PLUS (FILTER) IMPLANT
SYSTEM TRANSCAROTID NEUROPRTCT (MISCELLANEOUS) ×1 IMPLANT
TOWEL GREEN STERILE (TOWEL DISPOSABLE) ×1 IMPLANT
WATER STERILE IRR 1000ML POUR (IV SOLUTION) ×1 IMPLANT
WIRE BENTSON .035X145CM (WIRE) ×1 IMPLANT

## 2024-02-12 NOTE — Anesthesia Procedure Notes (Signed)
 Arterial Line Insertion Start/End7/04/2024 8:15 AM, 02/12/2024 8:20 AM Performed by: Emmitt Millman, CRNA, CRNA  Patient location: Pre-op. Preanesthetic checklist: patient identified, IV checked, site marked, risks and benefits discussed, surgical consent, monitors and equipment checked, pre-op evaluation, timeout performed and anesthesia consent Lidocaine  1% used for infiltration Right, radial was placed Catheter size: 20 G Hand hygiene performed , maximum sterile barriers used  and Seldinger technique used Allen's test indicative of satisfactory collateral circulation Attempts: 2 Procedure performed using ultrasound guided technique. Ultrasound Notes:anatomy identified, needle tip was noted to be adjacent to the nerve/plexus identified and no ultrasound evidence of intravascular and/or intraneural injection Following insertion, dressing applied. Post procedure assessment: normal and unchanged  Patient tolerated the procedure well with no immediate complications.

## 2024-02-12 NOTE — Discharge Instructions (Addendum)
 [facility]  Discharge Instructions   Carotid Surgery  Please refer to the following instructions for your post-procedure care. Your surgeon or physician assistant will discuss any changes with you.  Activity  You are encouraged to walk as much as you can. You can slowly return to normal activities but must avoid strenuous activity and heavy lifting until your doctor tell you it's okay. Avoid activities such as vacuuming or swinging a golf club. You can drive after one week if you are comfortable and you are no longer taking prescription pain medications. It is normal to feel tired for serval weeks after your surgery. It is also normal to have difficulty with sleep habits, eating, and bowel movements after surgery. These will go away with time.  Bathing/Showering  Shower daily after you go home. Do not soak in a bathtub, hot tub, or swim until the incision heals completely.  Incision Care  Shower every day. Clean your incision with mild soap and water. Pat the area dry with a clean towel. You do not need a bandage unless otherwise instructed. Do not apply any ointments or creams to your incision. You may have skin glue on your incision. Do not peel it off. It will come off on its own in about one week. Your incision may feel thickened and raised for several weeks after your surgery. This is normal and the skin will soften over time.   For Men Only: It's okay to shave around the incision but do not shave the incision itself for 2 weeks. It is common to have numbness under your chin that could last for several months.  Diet  Resume your normal diet. There are no special food restrictions following this procedure. A low fat/low cholesterol diet is recommended for all patients with vascular disease. In order to heal from your surgery, it is CRITICAL to get adequate nutrition. Your body requires vitamins, minerals, and protein. Vegetables are the best source of  vitamins and minerals. Vegetables also provide the perfect balance of protein. Processed food has little nutritional value, so try to avoid this.  Medications  Resume taking all of your medications unless your doctor or physician assistant tells you not to. If your incision is causing pain, you may take over-the- counter pain relievers such as acetaminophen (Tylenol). If you were prescribed a stronger pain medication, please be aware these medications can cause nausea and constipation. Prevent nausea by taking the medication with a snack or meal. Avoid constipation by drinking plenty of fluids and eating foods with a high amount of fiber, such as fruits, vegetables, and grains.  Do not take Tylenol if you are taking prescription pain medications.  Follow Up  Our office will schedule a follow up appointment 2-3 weeks following discharge.  Please call us immediately for any of the following conditions  Increased pain, redness, drainage (pus) from your incision site. Fever of 101 degrees or higher. If you should develop stroke (slurred speech, difficulty swallowing, weakness on one side of your body, loss of vision) you should call 911 and go to the nearest emergency room.  Reduce your risk of vascular disease:  Stop smoking. If you would like help call [facility] at 1-800-QUIT-NOW (1-800-784-8669) or [facility] at [phone]. Manage your cholesterol Maintain a desired weight Control your diabetes Keep your blood pressure down  If you have any questions, please call the office at [phone].   Information on my medicine - ELIQUIS (apixaban)  Why was Eliquis   prescribed for you? Eliquis was prescribed for you to reduce the risk of a blood clot forming that can cause a stroke if you have a medical condition called atrial fibrillation (a type of irregular heartbeat).  What do You need to know about Eliquis ? Take your Eliquis TWICE DAILY - one tablet in the morning and one tablet  in the evening with or without food. If you have difficulty swallowing the tablet whole please discuss with your pharmacist how to take the medication safely.  Take Eliquis exactly as prescribed by your doctor and DO NOT stop taking Eliquis without talking to the doctor who prescribed the medication.  Stopping may increase your risk of developing a stroke.  Refill your prescription before you run out.  After discharge, you should have regular check-up appointments with your healthcare provider that is prescribing your Eliquis.  In the future your dose may need to be changed if your kidney function or weight changes by a significant amount or as you get older.  What do you do if you miss a dose? If you miss a dose, take it as soon as you remember on the same day and resume taking twice daily.  Do not take more than one dose of ELIQUIS at the same time to make up a missed dose.  Important Safety Information A possible side effect of Eliquis is bleeding. You should call your healthcare provider right away if you experience any of the following: Bleeding from an injury or your nose that does not stop. Unusual colored urine (red or dark brown) or unusual colored stools (red or Paz). Unusual bruising for unknown reasons. A serious fall or if you hit your head (even if there is no bleeding).  Some medicines may interact with Eliquis and might increase your risk of bleeding or clotting while on Eliquis. To help avoid this, consult your healthcare provider or pharmacist prior to using any new prescription or non-prescription medications, including herbals, vitamins, non-steroidal anti-inflammatory drugs (NSAIDs) and supplements.  This website has more information on Eliquis (apixaban): http://[email or web address].com/eliquis/home  

## 2024-02-12 NOTE — Consult Note (Addendum)
 Cardiology Consultation   Patient ID: Denise [name] MRN: [id number]; DOB: 14-Oct-1960  Admit date: 02/12/2024 Date of Consult: 02/12/2024  PCP:  Gretta Comer POUR, NP   [facility] Providers Cardiologist:  None        Patient Profile: Denise [name] is a 63 y.o. female with a hx of at least moderate nonobstructive CAD per coronary CTA in 10/2023, hyperdynamic LV systolic function with a EF of 70 to 75% on echo on 10/15/2023, moderate AS/mild AI, carotid artery disease, hypertension, hyperlipidemia, obesity, tobacco abuse, hyponatremia/SIADH, COPD, pumonary nodule, traumatic mechanical fall 10/2023, PAD, depression who is being seen 02/12/2024 for the evaluation of bigeminy at the request of Dr. Gretta.  History of Present Illness: Denise [name] was originally seen by Dr. Alveta in 08/2023 for preop evaluation in preparation for carotid surgery. Coronary CTA was ordered and finalized 10/2023 with CAC 659 (98%ile), severe mixed plaque in prox LAD 70-99%, 25-49% in LCx/RCA, severe lipomatous hypertrophy of the interatrial septum, FFR negative. Echo 10/2023 showed EF 70-75%, moderate LVH, moderate LAE, mild AI, moderate AS. There was also a lung nodule being followed by pulmonology, bronch negative for malignant cells, but plan for surveillance PET in 03/2024. She was originally going to have her right carotid surgery earlier this year then had admission for complex traumatic mechanical fall resulting in multiple fractures. She is also known to have abnormal ABIs. In 2024 she began to have blurry vision in her right eye with carotid duplex suggestive of high grade stenosis bilaterally, prompting CTA that confirmed the same. F/u duplex 01/2024 showed that right carotid was now totally occluded, and LICA was 80-99%. Today she was brought in for left TCAR, planned for medical therapy on the right. Per discussion with PACU nurse, she was reported to be in trigeminy during the case and in bigeminy in  PACU with sinus tach. Over several hours' time, HR improved to the 80s and ectopy resolved. The patient denies any awareness of these palpitations. She does report a rare fleeting focal chest wall pain about 3x a month ever since her fall that lasts only minutes, typically brought on with movement/position changes, but not every time. It resolves without intervention. No chest tightness, worsening dyspnea (has chronic baseline SOB). No acute complaints currently aside from soreness at surgical site. PACs and PVCs have been captured on prior EKGs back to 2021.  Telemetry is no longer available from PACU, but printed strips outlined below. Currently she is in NSR with occasional PACs.     Past Medical History:  Diagnosis Date   Allergy    Anemia 12/24/2019   Anxiety    Aortic insufficiency    Aortic stenosis    Arthritis    Carotid artery disease (HCC)    Centrilobular emphysema (HCC) 11/2023   Chickenpox    Cholecystitis with cholelithiasis 03/31/2012   Closed fracture of L4 transverse process s/p traumatic mechanical fall 10/26/2023   COPD (chronic obstructive pulmonary disease) (HCC)    Coronary artery disease    Depression    Diastolic dysfunction    Dyspnea    with exertion   GERD (gastroesophageal reflux disease)    Headache    Heart murmur    High anion gap metabolic acidosis 10/26/2023   History of blood transfusion 2021   HLD (hyperlipidemia)    Hypertension 03/31/2012   Hyponatremia in setting of SIADH    Left lower lobe pulmonary nodule 11/2023   Long-term use of aspirin  therapy  Lumbar compression fracture s/p traumatic mechanical fall; L4 superior endplate    Pneumonia    x 2   PVD (peripheral vascular disease) (HCC)    Shingles    SIADH (syndrome of inappropriate ADH production) (HCC)    Traumatic mechanical fall 10/26/2023   a.) s/p traumatic fall 10/26/2023 reuslting in multiple RIGHT rib (5th - 7th) fractures (required dchest tube), l$ transverse process  fracture, L4 superior endplate compression fracture, large volume post-traumatic myofacial/subcutaneous gas collection in chest    Past Surgical History:  Procedure Laterality Date   BIOPSY  12/24/2019   Procedure: BIOPSY;  Surgeon: Teressa Toribio SQUIBB, MD;  Location: [facility] ENDOSCOPY;  Service: Endoscopy;;   BRONCHIAL BIOPSY  12/19/2023   Procedure: BRONCHOSCOPY, WITH BIOPSY;  Surgeon: Isadora Hose, MD;  Location: [facility] ENDOSCOPY;  Service: Pulmonary;;   BRONCHIAL BRUSHINGS  12/19/2023   Procedure: BRONCHOSCOPY, WITH BRUSH BIOPSY;  Surgeon: Isadora Hose, MD;  Location: [facility] ENDOSCOPY;  Service: Pulmonary;;   BRONCHIAL NEEDLE ASPIRATION BIOPSY  12/19/2023   Procedure: BRONCHOSCOPY, WITH NEEDLE ASPIRATION BIOPSY;  Surgeon: Isadora Hose, MD;  Location: [facility] ENDOSCOPY;  Service: Pulmonary;;   BRONCHIAL WASHINGS  12/19/2023   Procedure: IRRIGATION, BRONCHUS;  Surgeon: Isadora Hose, MD;  Location: [facility] ENDOSCOPY;  Service: Pulmonary;;   BRONCHOSCOPY, WITH BIOPSY USING ELECTROMAGNETIC NAVIGATION Bilateral 12/19/2023   Procedure: ROBOTIC ASSISTED NAVIGATIONAL BRONCHOSCOPY;  Surgeon: Isadora Hose, MD;  Location: [facility] ENDOSCOPY;  Service: Pulmonary;  Laterality: Bilateral;   CATARACT EXTRACTION Bilateral 2021   CHOLECYSTECTOMY  03/30/2012   Procedure: LAPAROSCOPIC CHOLECYSTECTOMY WITH INTRAOPERATIVE CHOLANGIOGRAM;  Surgeon: Morene ONEIDA Olives, MD;  Location: [facility] ORS;  Service: General;  Laterality: N/A;   COLONOSCOPY WITH PROPOFOL  N/A 12/24/2019   Procedure: COLONOSCOPY WITH PROPOFOL ;  Surgeon: Teressa Toribio SQUIBB, MD;  Location: [facility] ENDOSCOPY;  Service: Endoscopy;  Laterality: N/A;   ESOPHAGOGASTRODUODENOSCOPY (EGD) WITH PROPOFOL  N/A 12/24/2019   Procedure: ESOPHAGOGASTRODUODENOSCOPY (EGD) WITH PROPOFOL ;  Surgeon: Teressa Toribio SQUIBB, MD;  Location: [facility] ENDOSCOPY;  Service: Endoscopy;  Laterality: N/A;   HOT HEMOSTASIS N/A 12/24/2019   Procedure: HOT HEMOSTASIS (ARGON PLASMA COAGULATION/BICAP);  Surgeon: Teressa Toribio SQUIBB, MD;  Location: [facility] ENDOSCOPY;  Service: Endoscopy;  Laterality: N/A;   POLYPECTOMY  12/24/2019   Procedure: POLYPECTOMY;  Surgeon: Teressa Toribio SQUIBB, MD;  Location: [facility] ENDOSCOPY;  Service: Endoscopy;;   TUBAL LIGATION  1997   VIDEO BRONCHOSCOPY WITH ENDOBRONCHIAL ULTRASOUND  12/19/2023   Procedure: BRONCHOSCOPY, WITH EBUS;  Surgeon: Isadora Hose, MD;  Location: [facility] ENDOSCOPY;  Service: Pulmonary;;   VIDEO BRONCHOSCOPY WITH RADIAL ENDOBRONCHIAL ULTRASOUND  12/19/2023   Procedure: VIDEO BRONCHOSCOPY WITH RADIAL ENDOBRONCHIAL ULTRASOUND;  Surgeon: Isadora Hose, MD;  Location: [facility] ENDOSCOPY;  Service: Pulmonary;;     Home Medications:  Prior to Admission medications   Medication Sig Start Date End Date Taking? Authorizing Provider  acetaminophen  (TYLENOL ) 500 MG tablet Take 2 tablets (1,000 mg total) by mouth every 8 (eight) hours as needed. 10/31/23  Yes Maczis, Michael M, PA-C  albuterol  (VENTOLIN  HFA) 108 (90 Base) MCG/ACT inhaler INHALE 2 PUFFS INTO THE LUNGS EVERY 4-6 HOURS AS NEEDED FOR TIGHTNESS/WHEEZING 01/31/24  Yes Clark, Katherine K, NP  amLODipine  (NORVASC ) 10 MG tablet TAKE 1 TABLET BY MOUTH EVERY DAY FOR BLOOD PRESSURE 12/15/23  Yes Clark, Katherine K, NP  Ascorbic Acid (VITAMIN C) 1000 MG tablet Take 1,000 mg by mouth daily.   Yes [provider]  aspirin  EC 81 MG tablet Take 162 mg by mouth daily.   Yes [provider]  atorvastatin  (LIPITOR ) 80 MG tablet TAKE 1 TABLET (80 MG TOTAL) BY MOUTH DAILY FOR CHOLESTEROL 01/15/24 01/14/25 Yes Gretta Comer POUR, NP  busPIRone  (BUSPAR ) 5 MG tablet Take 1 tablet (5 mg total) by mouth 2 (two) times daily. For anxiety 01/31/24  Yes Clark, Katherine K, NP  cetirizine (ZYRTEC) 10 MG tablet Take 10 mg by mouth daily as needed for allergies.   Yes [provider]  clopidogrel  (PLAVIX ) 75 MG tablet Take 1 tablet (75 mg total) by mouth daily. 02/10/24  Yes Gretta Lonni PARAS, MD  diphenhydrAMINE  (BENADRYL ) 25 MG tablet Take 25  mg by mouth every 6 (six) hours as needed for allergies.   Yes [provider]  escitalopram  (LEXAPRO ) 20 MG tablet TAKE 1 TABLET BY MOUTH DAILY FOR ANXIETY 12/15/23  Yes Clark, Katherine K, NP  famotidine  (PEPCID ) 20 MG tablet Take 20 mg by mouth daily.   Yes [provider]  ferrous sulfate  325 (65 FE) MG tablet Take 325 mg by mouth daily with breakfast.   Yes [provider]  fluticasone -salmeterol (WIXELA INHUB) 250-50 MCG/ACT AEPB INHALE 1 PUFF INTO THE LUNGS IN THE MORNING AND AT BEDTIME. 08/18/23  Yes Clark, Katherine K, NP  lisinopril  (ZESTRIL ) 30 MG tablet Take 1 tablet (30 mg total) by mouth daily. for blood pressure. 01/31/24  Yes Clark, Katherine K, NP  pyridoxine (B-6) 100 MG tablet Take 100 mg by mouth daily.   Yes [provider]  sodium chloride  1 g tablet TAKE 1 TABLET (1 G TOTAL) BY MOUTH 2 (TWO) TIMES DAILY WITH A MEAL. TO INCREASE SODIUM 01/07/24  Yes Clark, Katherine K, NP  vitamin B-12 (CYANOCOBALAMIN ) 1000 MCG tablet Take 1,000 mcg by mouth daily.   Yes [provider]  vitamin E 180 MG (400 UNITS) capsule Take 400 Units by mouth daily.   Yes [provider]    Scheduled Meds:  Chlorhexidine  Gluconate Cloth  6 each Topical Once   And   Chlorhexidine  Gluconate Cloth  6 each Topical Once   Continuous Infusions:  sodium chloride      acetaminophen      lactated ringers      PRN Meds: acetaminophen , droperidol , fentaNYL  (SUBLIMAZE ) injection, oxyCODONE  **OR** oxyCODONE   Allergies:   No Known Allergies  Social History:   Social History   Socioeconomic History   Marital status: Widowed    Spouse name: Not on file   Number of children: 2   Years of education: Not on file   Highest education level: 12th grade  Occupational History   Not on file  Tobacco Use   Smoking status: Former    Current packs/day: 0.00    Average packs/day: 0.8 packs/day for 38.0 years (28.5 ttl pk-yrs)    Types: Cigarettes    Start date:  05/06/1985    Quit date: 05/07/2023    Years since quitting: 0.7   Smokeless tobacco: Never  Vaping Use   Vaping status: Never Used  Substance and Sexual Activity   Alcohol use: Yes    Alcohol/week: 1.0 standard drink of alcohol    Types: 1 Standard drinks or equivalent per week    Comment: occasionally   Drug use: No   Sexual activity: Not Currently    Birth control/protection: Post-menopausal  Other Topics Concern   Not on file  Social History Narrative   Widow.    2 children.   Works in Clinical biochemist.   Enjoys reading, cooking.    Social Drivers of Corporate investment banker  Strain: Patient Declined (01/28/2024)   Overall Financial Resource Strain (CARDIA)    Difficulty of Paying Living Expenses: Patient declined  Food Insecurity: Food Insecurity Present (01/28/2024)   Hunger Vital Sign    Worried About Running Out of Food in the Last Year: Sometimes true    Ran Out of Food in the Last Year: Patient declined  Transportation Needs: No Transportation Needs (01/28/2024)   PRAPARE - Administrator, Civil Service (Medical): No    Lack of Transportation (Non-Medical): No  Recent Concern: Transportation Needs - Unmet Transportation Needs (11/29/2023)   PRAPARE - Transportation    Lack of Transportation (Medical): No    Lack of Transportation (Non-Medical): Yes  Physical Activity: Insufficiently Active (01/28/2024)   Exercise Vital Sign    Days of Exercise per Week: 4 days    Minutes of Exercise per Session: 20 min  Stress: Stress Concern Present (01/28/2024)   [name] of Occupational Health - Occupational Stress Questionnaire    Feeling of Stress: Very much  Social Connections: Socially Isolated (01/28/2024)   Social Connection and Isolation Panel    Frequency of Communication with Friends and Family: Three times a week    Frequency of Social Gatherings with Friends and Family: Patient declined    Attends Religious Services: Patient declined    Active  Member of Clubs or Organizations: No    Attends Engineer, structural: Not on file    Marital Status: Widowed  Catering manager Violence: Not on file    Family History:    Family History  Problem Relation Age of Onset   Arthritis Mother    Cervical cancer Mother    Heart disease Father    Hypertension Father      ROS:  Please see the history of present illness.  All other ROS reviewed and negative.     Physical Exam/Data: Vitals:   02/12/24 1300 02/12/24 1315 02/12/24 1330 02/12/24 1345  BP: 93/61 (!) 94/58 94/62 (!) 84/55  Pulse: 75 82 81 71  Resp: 16 (!) 0 15 15  Temp:      TempSrc:      SpO2: 95% 95% 98% 95%  Weight:      Height:        Intake/Output Summary (Last 24 hours) at 02/12/2024 1353 Last data filed at 02/12/2024 1051 Gross per 24 hour  Intake 1500 ml  Output 25 ml  Net 1475 ml      02/12/2024    6:32 AM 02/03/2024    8:54 AM 01/31/2024   11:01 AM  Last 3 Weights  Weight (lbs) 163 lb 163 lb 12.8 oz 163 lb  Weight (kg) 73.936 kg 74.299 kg 73.936 kg     Body mass index is 28.87 kg/m.  General: Well developed, well nourished, in no acute distress. Head: Normocephalic, atraumatic, sclera non-icteric, no xanthomas, nares are without discharge. Neck: Negative for carotid bruits. JVP not elevated. Lungs: Clear bilaterally to auscultation without wheezes, rales, or rhonchi. Breathing is unlabored. Heart: RRR S1 S2 without murmurs, rubs, or gallops.  Abdomen: Soft, non-tender, non-distended with normoactive bowel sounds. No rebound/guarding. Extremities: No clubbing or cyanosis. No edema. Distal pedal pulses are 2+ and equal bilaterally. Neuro: Alert and oriented X 3. Moves all extremities spontaneously. Psych:  Responds to questions appropriately with a normal affect.  EKG:  The EKG was personally reviewed and demonstrates:  2 tracings reviewed. NSR with nonspecific STTW changes, possible prior septal infarct vs baseline for patient, one PAC.  No acute  changes from prior. Telemetry:  Telemetry was personally reviewed and demonstrates:  as outlined above  Relevant CV Studies: 2d echo 10/15/23   1. Left ventricular ejection fraction, by estimation, is 70 to 75%. The  left ventricle has hyperdynamic function. The left ventricle has no  regional wall motion abnormalities. There is moderate concentric left  ventricular hypertrophy. Left ventricular  diastolic parameters are consistent with Grade I diastolic dysfunction  (impaired relaxation).   2. Right ventricular systolic function is normal. The right ventricular  size is normal.   3. Left atrial size was moderately dilated.   4. The mitral valve is normal in structure. Trivial mitral valve  regurgitation. No evidence of mitral stenosis.   5. The aortic valve is tricuspid. There is moderate calcification of the  aortic valve. Aortic valve regurgitation is mild. Moderate aortic valve  stenosis. Aortic regurgitation PHT measures 404 msec. Aortic valve area,  by VTI measures 1.21 cm. Aortic  valve mean gradient measures 25.7 mmHg. Aortic valve Vmax measures 3.54  m/s.   6. The inferior vena cava is normal in size with greater than 50%  respiratory variability, suggesting right atrial pressure of 3 mmHg.   Cor CT 10/2023 IMPRESSION: 1. Coronary calcium  score of 659. This was 98th percentile for age-, sex, and race-matched controls.   2. Total plaque volume 708 mm3 which is 91st percentile for age- and sex-matched controls (calcified plaque 152 mm3; non-calcified plaque 556 mm3). TPV is severe.   3. Normal coronary origin with right dominance.   4. Severe mixed density plaque in the proximal LAD (70-99%).   5. Mild mixed density plaque in the LCX (25-49%).   6. Mild mixed density plaque in the RCA (25-49%).   7. Severe lipomatous hypertrophy of the interatrial septum extending into the RA, correlate with echocardiography.   RECOMMENDATIONS: 1. CAD-RADS 4: Severe stenosis. CT  FFR will be submitted. Consider symptom-guided anti-ischemic pharmacotherapy as well as risk factor modification per guideline directed care. Invasive coronary angiography recommended with revascularization per published guideline statements.   FINDINGS: 1. Left Main: 1.0; low likelihood of hemodynamic significance.   2. Prox LAD: 0.87; low likelihood of hemodynamic significance. 3. Mid LAD: 0.85; Low likelihood of hemodynamic significance. 4. Distal LAD: 0.79; Low likelihood of hemodynamic significance (tapering artifact). 5. D1: 0.96; Low likelihood of hemodynamic significance. 6. LCX: 0.98; low likelihood of hemodynamic significance. 7. RCA: 0.94; low likelihood of hemodynamic significance.   IMPRESSION:   1.  Proximal LAD is negative by CT FFR.   Darryle Decent, MD    Laboratory Data: High Sensitivity Troponin:  No results for input(s): TROPONINIHS in the last 720 hours.   ChemistryNo results for input(s): NA, K, CL, CO2, GLUCOSE, BUN, CREATININE, CALCIUM , MG, GFRNONAA, GFRAA, ANIONGAP in the last 168 hours.  No results for input(s): PROT, ALBUMIN , AST, ALT, ALKPHOS, BILITOT in the last 168 hours. Lipids No results for input(s): CHOL, TRIG, HDL, LABVLDL, LDLCALC, CHOLHDL in the last 168 hours.  HematologyNo results for input(s): WBC, RBC, HGB, HCT, MCV, MCH, MCHC, RDW, PLT in the last 168 hours. Thyroid No results for input(s): TSH, FREET4 in the last 168 hours.  BNPNo results for input(s): BNP, PROBNP in the last 168 hours.  DDimer No results for input(s): DDIMER in the last 168 hours.  Radiology/Studies:  DG C-Arm 1-60 Min Result Date: 02/12/2024 CLINICAL DATA:  Trans parotid artery revascularization. EXAM: DG C-ARM 1-60 MIN COMPARISON:  None Available. FINDINGS: Multiple fluoroscopic spot images provided.  The total fluoroscopic time is 5 minutes, 19 seconds with cumulative air Karma 43.015 mGy.  IMPRESSION: Fluoroscopic study as above. Electronically Signed   By: Vanetta Chou M.D.   On: 02/12/2024 11:40     Assessment and Plan:  1. Severe carotid artery disease s/p L TCAR - post-op management per vascular surgery  2. PVCs, PACs - noted to have ventricular bigeminy and trigeminy earlier today, now improved - currently NSR with occasional PACs on telemetry - check BMET, Mg, TSH, CBC - patient was noted to have occasional ectopy back to 2021 so do not suspect acute issue, minimally symptomatic and known normal LVEF - would follow on telemetry for now, hold off beta blocker therapy given post-op soft BP - will review further with MD - tentatively arranged 02/27/24 with Katlyn West (I do not have availability) - can consider outpatient monitor to assess ectopy burden at that time - addendum: mag level 1.7, K wnl. Will supplement magnesium  with IV mag sulfate and recheck in AM. TSH suppressed, will also obtain free T4 level. Will need OP f/u of this with PCP  3. CAD - reports rare episodic fleeting chest pains that do not sound anginal in nature - cor CT 10/2023 with suggestion of severe LAD disease but FFR negative, and nonobstructive disease elsewhere - continue medical management  4. Moderate AS, mild AI by echo 10/2023 - no acute indication to repeat, consider f/u by 10/2024  5. HLD - continue atorvastatin  80mg  daily - lipid panel already ordered for AM  Otherwise remainder of medical issues per primary team  For questions or updates, please contact [facility] Please consult [email or web address] for contact info under    Signed, Ramani Riva N Sheyenne Konz, PA-C  02/12/2024 1:53 PM

## 2024-02-12 NOTE — Anesthesia Postprocedure Evaluation (Signed)
 Anesthesia Post Note  Patient: Denise [name]  Procedure(s) Performed: LERETHA ARTERY REVASCULARIZATION (TCAR) (Left)     Patient location during evaluation: PACU Anesthesia Type: General Level of consciousness: awake and alert Pain management: pain level controlled Vital Signs Assessment: post-procedure vital signs reviewed and stable Respiratory status: spontaneous breathing, nonlabored ventilation, respiratory function stable and patient connected to nasal cannula oxygen Cardiovascular status: blood pressure returned to baseline and stable Postop Assessment: no apparent nausea or vomiting Anesthetic complications: no   No notable events documented.  Last Vitals:  Vitals:   02/12/24 1400 02/12/24 1427  BP: (!) 88/57 99/70  Pulse: 78 68  Resp: 17 16  Temp: 36.7 C 36.6 C  SpO2: 95%     Last Pain:  Vitals:   02/12/24 1427  TempSrc: Oral  PainSc:                  Denise [name]

## 2024-02-12 NOTE — Transfer of Care (Signed)
 Immediate Anesthesia Transfer of Care Note  Patient: Denise [name]  Procedure(s) Performed: LERETHA ARTERY REVASCULARIZATION (TCAR) (Left)  Patient Location: PACU  Anesthesia Type:General  Level of Consciousness: awake, alert , and oriented  Airway & Oxygen Therapy: Patient Spontanous Breathing and Patient connected to face mask oxygen  Post-op Assessment: Report given to RN and Post -op Vital signs reviewed and stable  Post vital signs: Reviewed and stable  Last Vitals:  Vitals Value Taken Time  BP 114/62 02/12/24 10:45  Temp    Pulse 60 02/12/24 10:49  Resp 18 02/12/24 10:49  SpO2 85 % 02/12/24 10:47  Vitals shown include unfiled device data.  Last Pain:  Vitals:   02/12/24 0718  TempSrc:   PainSc: 0-No pain         Complications: No notable events documented.

## 2024-02-12 NOTE — Anesthesia Procedure Notes (Signed)
 Procedure Name: Intubation Date/Time: 02/12/2024 8:43 AM  Performed by: Emmitt Millman, CRNAPre-anesthesia Checklist: Patient identified, Emergency Drugs available, Suction available and Patient being monitored Patient Re-evaluated:Patient Re-evaluated prior to induction Oxygen Delivery Method: Circle system utilized Preoxygenation: Pre-oxygenation with 100% oxygen Induction Type: IV induction Ventilation: Mask ventilation without difficulty Laryngoscope Size: Mac and 3 Grade View: Grade I Tube type: Oral Tube size: 7.0 mm Number of attempts: 1 Airway Equipment and Method: Stylet Placement Confirmation: ETT inserted through vocal cords under direct vision, positive ETCO2 and breath sounds checked- equal and bilateral Secured at: 22 cm Tube secured with: Tape Dental Injury: Teeth and Oropharynx as per pre-operative assessment

## 2024-02-12 NOTE — H&P (Signed)
 H&P     MRN #:  [id number]  History of Present Illness: This is a 63 y.o. female with history of hypertension and carotid artery disease that presents to discuss PAD and carotid disease.  She was scheduled for right carotid endarterectomy earlier this year and fell off her porch breaking multiple ribs and surgery was canceled.  States she then had swelling and ecchymosis to her right leg that was very slow to heal.  Her primary care doctor did a DVT study that was negative but also got ABIs.  These were 0.69 on the right and 0.85 on the left.  She denies any leg pain when walking.  No open wounds.  Just feels generally tired with significant exertion.   Patient is well-known to our practice and was seen last year with carotid artery disease.  She was noted in April or May 2024 to have having blurry vision in her right eye.  Ultimately this led to evaluation by ophthalmology and she states she was told she had evidence of diabetic retinopathy although she has no history of diabetes. As part of the workup she did have carotid ultrasound.  Patient had carotid ultrasound on 04/23/2023 with findings suggesting hemodynamically significant stenosis in the right carotid artery distal to a significant anterior wall plaque with shadowing and also a high-grade left carotid stenosis 70 to 99%.  She did have an MRI brain in the ED on 02/21/2023 with no acute intracranial abnormality.  I sent her for CTA neck that showed bilateral high-grade stenosis.  She was previously offered a right carotid endarterectomy.  Ultimately her surgery earlier this year for right carotid endarterectomy was canceled after she presented with a trauma with mechanical fall and broken ribs.  We subsequently repeated her carotid duplex at last clinic visit and right carotid is now occluded.    Past Medical History:  Diagnosis Date   Allergy    Anemia 12/24/2019   Anxiety    Aortic stenosis    Arthritis    Carotid artery disease (HCC)     a.) doppler 04/23/2023: 70-99% LEFT carotid bifurcation   Centrilobular emphysema (HCC) 11/2023   Chickenpox    Cholecystitis with cholelithiasis 03/31/2012   Closed fracture of L4 transverse process s/p traumatic mechanical fall 10/26/2023   COPD (chronic obstructive pulmonary disease) (HCC)    Coronary artery disease    Depression    Diastolic dysfunction    Dyspnea    with exertion   GERD (gastroesophageal reflux disease)    Headache    Heart murmur    never has caused any problems   High anion gap metabolic acidosis 10/26/2023   History of blood transfusion 2021   HLD (hyperlipidemia)    Hypertension 03/31/2012   Hyponatremia in setting of SIADH    Left lower lobe pulmonary nodule 11/2023   Long-term use of aspirin  therapy    Lumbar compression fracture s/p traumatic mechanical fall; L4 superior endplate    Pneumonia    x 2   PVD (peripheral vascular disease) (HCC)    Shingles    SIADH (syndrome of inappropriate ADH production) (HCC)    Traumatic mechanical fall 10/26/2023   a.) s/p traumatic fall 10/26/2023 reuslting in multiple RIGHT rib (5th - 7th) fractures (required dchest tube), l$ transverse process fracture, L4 superior endplate compression fracture, large volume post-traumatic myofacial/subcutaneous gas collection in chest    Past Surgical History:  Procedure Laterality Date   BIOPSY  12/24/2019   Procedure: BIOPSY;  Surgeon: Teressa,  Toribio SQUIBB, MD;  Location: [facility] ENDOSCOPY;  Service: Endoscopy;;   BRONCHIAL BIOPSY  12/19/2023   Procedure: BRONCHOSCOPY, WITH BIOPSY;  Surgeon: Isadora Hose, MD;  Location: [facility] ENDOSCOPY;  Service: Pulmonary;;   BRONCHIAL BRUSHINGS  12/19/2023   Procedure: BRONCHOSCOPY, WITH BRUSH BIOPSY;  Surgeon: Isadora Hose, MD;  Location: [facility] ENDOSCOPY;  Service: Pulmonary;;   BRONCHIAL NEEDLE ASPIRATION BIOPSY  12/19/2023   Procedure: BRONCHOSCOPY, WITH NEEDLE ASPIRATION BIOPSY;  Surgeon: Isadora Hose, MD;  Location: [facility] ENDOSCOPY;  Service:  Pulmonary;;   BRONCHIAL WASHINGS  12/19/2023   Procedure: IRRIGATION, BRONCHUS;  Surgeon: Isadora Hose, MD;  Location: [facility] ENDOSCOPY;  Service: Pulmonary;;   BRONCHOSCOPY, WITH BIOPSY USING ELECTROMAGNETIC NAVIGATION Bilateral 12/19/2023   Procedure: ROBOTIC ASSISTED NAVIGATIONAL BRONCHOSCOPY;  Surgeon: Isadora Hose, MD;  Location: [facility] ENDOSCOPY;  Service: Pulmonary;  Laterality: Bilateral;   CATARACT EXTRACTION Bilateral 2021   CHOLECYSTECTOMY  03/30/2012   Procedure: LAPAROSCOPIC CHOLECYSTECTOMY WITH INTRAOPERATIVE CHOLANGIOGRAM;  Surgeon: Morene ONEIDA Olives, MD;  Location: [facility] ORS;  Service: General;  Laterality: N/A;   COLONOSCOPY WITH PROPOFOL  N/A 12/24/2019   Procedure: COLONOSCOPY WITH PROPOFOL ;  Surgeon: Teressa Toribio SQUIBB, MD;  Location: [facility] ENDOSCOPY;  Service: Endoscopy;  Laterality: N/A;   ESOPHAGOGASTRODUODENOSCOPY (EGD) WITH PROPOFOL  N/A 12/24/2019   Procedure: ESOPHAGOGASTRODUODENOSCOPY (EGD) WITH PROPOFOL ;  Surgeon: Teressa Toribio SQUIBB, MD;  Location: [facility] ENDOSCOPY;  Service: Endoscopy;  Laterality: N/A;   HOT HEMOSTASIS N/A 12/24/2019   Procedure: HOT HEMOSTASIS (ARGON PLASMA COAGULATION/BICAP);  Surgeon: Teressa Toribio SQUIBB, MD;  Location: [facility] ENDOSCOPY;  Service: Endoscopy;  Laterality: N/A;   POLYPECTOMY  12/24/2019   Procedure: POLYPECTOMY;  Surgeon: Teressa Toribio SQUIBB, MD;  Location: [facility] ENDOSCOPY;  Service: Endoscopy;;   TUBAL LIGATION  1997   VIDEO BRONCHOSCOPY WITH ENDOBRONCHIAL ULTRASOUND  12/19/2023   Procedure: BRONCHOSCOPY, WITH EBUS;  Surgeon: Isadora Hose, MD;  Location: [facility] ENDOSCOPY;  Service: Pulmonary;;   VIDEO BRONCHOSCOPY WITH RADIAL ENDOBRONCHIAL ULTRASOUND  12/19/2023   Procedure: VIDEO BRONCHOSCOPY WITH RADIAL ENDOBRONCHIAL ULTRASOUND;  Surgeon: Isadora Hose, MD;  Location: [facility] ENDOSCOPY;  Service: Pulmonary;;    No Known Allergies  Prior to Admission medications   Medication Sig Start Date End Date Taking? Authorizing Provider  acetaminophen  (TYLENOL ) 500 MG  tablet Take 2 tablets (1,000 mg total) by mouth every 8 (eight) hours as needed. 10/31/23  Yes Maczis, Michael M, PA-C  albuterol  (VENTOLIN  HFA) 108 (90 Base) MCG/ACT inhaler INHALE 2 PUFFS INTO THE LUNGS EVERY 4-6 HOURS AS NEEDED FOR TIGHTNESS/WHEEZING 01/31/24  Yes Wilder Amodei, Katherine K, NP  amLODipine  (NORVASC ) 10 MG tablet TAKE 1 TABLET BY MOUTH EVERY DAY FOR BLOOD PRESSURE 12/15/23  Yes Gee Habig, Katherine K, NP  Ascorbic Acid (VITAMIN C) 1000 MG tablet Take 1,000 mg by mouth daily.   Yes [provider]  aspirin  EC 81 MG tablet Take 162 mg by mouth daily.   Yes [provider]  atorvastatin  (LIPITOR ) 80 MG tablet TAKE 1 TABLET (80 MG TOTAL) BY MOUTH DAILY FOR CHOLESTEROL 01/15/24 01/14/25 Yes Lyndell Gillyard, Katherine K, NP  busPIRone  (BUSPAR ) 5 MG tablet Take 1 tablet (5 mg total) by mouth 2 (two) times daily. For anxiety 01/31/24  Yes Cj Beecher, Katherine K, NP  cetirizine (ZYRTEC) 10 MG tablet Take 10 mg by mouth daily as needed for allergies.   Yes [provider]  clopidogrel  (PLAVIX ) 75 MG tablet Take 1 tablet (75 mg total) by mouth daily. 02/10/24  Yes Gretta Lonni PARAS, MD  diphenhydrAMINE  (BENADRYL ) 25 MG tablet Take 25 mg by mouth  every 6 (six) hours as needed for allergies.   Yes [provider]  escitalopram  (LEXAPRO ) 20 MG tablet TAKE 1 TABLET BY MOUTH DAILY FOR ANXIETY 12/15/23  Yes Tashanda Fuhrer, Katherine K, NP  famotidine  (PEPCID ) 20 MG tablet Take 20 mg by mouth daily.   Yes [provider]  ferrous sulfate  325 (65 FE) MG tablet Take 325 mg by mouth daily with breakfast.   Yes [provider]  fluticasone -salmeterol (WIXELA INHUB) 250-50 MCG/ACT AEPB INHALE 1 PUFF INTO THE LUNGS IN THE MORNING AND AT BEDTIME. 08/18/23  Yes Maham Quintin, Katherine K, NP  lisinopril  (ZESTRIL ) 30 MG tablet Take 1 tablet (30 mg total) by mouth daily. for blood pressure. 01/31/24  Yes Dalissa Lovin, Katherine K, NP  pyridoxine (B-6) 100 MG tablet Take 100 mg by mouth daily.   Yes [provider]  sodium chloride  1 g tablet TAKE 1 TABLET (1 G TOTAL) BY MOUTH 2 (TWO) TIMES DAILY WITH A MEAL. TO INCREASE SODIUM 01/07/24  Yes Joury Allcorn, Katherine K, NP  vitamin B-12 (CYANOCOBALAMIN ) 1000 MCG tablet Take 1,000 mcg by mouth daily.   Yes [provider]  vitamin E 180 MG (400 UNITS) capsule Take 400 Units by mouth daily.   Yes [provider]    Social History   Socioeconomic History   Marital status: Widowed    Spouse name: Not on file   Number of children: 2   Years of education: Not on file   Highest education level: 12th grade  Occupational History   Not on file  Tobacco Use   Smoking status: Former    Current packs/day: 0.00    Average packs/day: 0.8 packs/day for 38.0 years (28.5 ttl pk-yrs)    Types: Cigarettes    Start date: 05/06/1985    Quit date: 05/07/2023    Years since quitting: 0.7   Smokeless tobacco: Never  Vaping Use   Vaping status: Never Used  Substance and Sexual Activity   Alcohol use: Yes    Alcohol/week: 1.0 standard drink of alcohol    Types: 1 Standard drinks or equivalent per week    Comment: occasionally   Drug use: No   Sexual activity: Not Currently    Birth control/protection: Post-menopausal  Other Topics Concern   Not on file  Social History Narrative   Widow.    2 children.   Works in Clinical biochemist.   Enjoys reading, cooking.    Social Drivers of Health   Financial Resource Strain: Patient Declined (01/28/2024)   Overall Financial Resource Strain (CARDIA)    Difficulty of Paying Living Expenses: Patient declined  Food Insecurity: Food Insecurity Present (01/28/2024)   Hunger Vital Sign    Worried About Running Out of Food in the Last Year: Sometimes true    Ran Out of Food in the Last Year: Patient declined  Transportation Needs: No Transportation Needs (01/28/2024)   PRAPARE - Administrator, Civil Service (Medical): No    Lack of Transportation (Non-Medical): No  Recent Concern:  Transportation Needs - Unmet Transportation Needs (11/29/2023)   PRAPARE - Transportation    Lack of Transportation (Medical): No    Lack of Transportation (Non-Medical): Yes  Physical Activity: Insufficiently Active (01/28/2024)   Exercise Vital Sign    Days of Exercise per Week: 4 days    Minutes of Exercise per Session: 20 min  Stress: Stress Concern Present (01/28/2024)   [name] of Occupational Health - Occupational Stress Questionnaire    Feeling of  Stress: Very much  Social Connections: Socially Isolated (01/28/2024)   Social Connection and Isolation Panel    Frequency of Communication with Friends and Family: Three times a week    Frequency of Social Gatherings with Friends and Family: Patient declined    Attends Religious Services: Patient declined    Active Member of Clubs or Organizations: No    Attends Engineer, structural: Not on file    Marital Status: Widowed  Catering manager Violence: Not on file     Family History  Problem Relation Age of Onset   Arthritis Mother    Cervical cancer Mother    Heart disease Father    Hypertension Father     ROS: [x]  Positive   [ ]  Negative   [ ]  All sytems reviewed and are negative  Cardiovascular: []  chest pain/pressure []  palpitations []  SOB lying flat []  DOE []  pain in legs while walking []  pain in legs at rest []  pain in legs at night []  non-healing ulcers []  hx of DVT []  swelling in legs  Pulmonary: []  productive cough []  asthma/wheezing []  home O2  Neurologic: []  weakness in []  arms []  legs []  numbness in []  arms []  legs []  hx of CVA []  mini stroke [] difficulty speaking or slurred speech []  temporary loss of vision in one eye []  dizziness  Hematologic: []  hx of cancer []  bleeding problems []  problems with blood clotting easily  Endocrine:   []  diabetes []  thyroid disease  GI []  vomiting blood []  blood in stool  GU: []  CKD/renal failure []  HD--[]  M/W/F or []  T/T/S []  burning  with urination []  blood in urine  Psychiatric: []  anxiety []  depression  Musculoskeletal: []  arthritis []  joint pain  Integumentary: []  rashes []  ulcers  Constitutional: []  fever []  chills   Physical Examination  Vitals:   02/12/24 0632  BP: 128/81  Pulse: 99  Resp: 18  Temp: 98 F (36.7 C)  SpO2: 92%   Body mass index is 28.87 kg/m.  GENERAL: The patient is a well-nourished female, in no acute distress. The vital signs are documented above. CARDIAC: There is a regular rate and rhythm.  VASCULAR:  No previous neck incisions PULMONARY: No respiratory distress. ABDOMEN: Soft and non-tender. MUSCULOSKELETAL: There are no major deformities or cyanosis. NEUROLOGIC: No focal weakness or paresthesias are detected.  Grossly neurologically intact SKIN: There are no ulcers or rashes noted. PSYCHIATRIC: The patient has a normal affect.  CBC    Component Value Date/Time   WBC 9.8 02/03/2024 1000   RBC 5.41 (H) 02/03/2024 1000   HGB 16.7 (H) 02/03/2024 1000   HCT 50.4 (H) 02/03/2024 1000   PLT 336 02/03/2024 1000   MCV 93.2 02/03/2024 1000   MCH 30.9 02/03/2024 1000   MCHC 33.1 02/03/2024 1000   RDW 19.9 (H) 02/03/2024 1000   LYMPHSABS 1.0 11/21/2023 1254   MONOABS 0.6 11/21/2023 1254   EOSABS 196 11/29/2023 1516   BASOSABS 69 11/29/2023 1516    BMET    Component Value Date/Time   NA 131 (L) 02/03/2024 1000   NA 132 (L) 09/06/2023 1553   K 3.9 02/03/2024 1000   CL 96 (L) 02/03/2024 1000   CO2 25 02/03/2024 1000   GLUCOSE 92 02/03/2024 1000   BUN 6 (L) 02/03/2024 1000   BUN 11 09/06/2023 1553   CREATININE 0.61 02/03/2024 1000   CREATININE 0.54 11/29/2023 1516   CALCIUM  9.3 02/03/2024 1000   GFRNONAA >60 02/03/2024 1000   GFRAA >60  12/24/2019 0326    COAGS: Lab Results  Component Value Date   INR 1.0 02/03/2024   INR 0.9 10/30/2023   INR 0.9 10/26/2023     Non-Invasive Vascular Imaging:    Carotid duplex 01/29/2024 with occluded right carotid  and high-grade left ICA stenosis >80%   ASSESSMENT/PLAN: This is a 63 y.o. female was scheduled for carotid endarterectomy on the right earlier this year and this was canceled after she had trauma with mechanical fall and broke multiple ribs.  She was having blurry vision in her right eye in April/May 2024 and evaluated with ophthalmology and she reports findings consistent with diabetic retinopathy although she has no history of diabetes.  As part of this workup she had a carotid ultrasound on 04/23/2023 showing a high-grade 70 to 99% left ICA stenosis and there is also suggestion of a possible high-grade contralateral stenosis on the right but difficult to establish with significant anterior wall plaque with shadowing.  Ultimately I sent her for CTA neck showing bilateral high-grade ICA stenosis.  I previously discussed the focus to be on the right side and had recommended a right carotid endarterectomy due to significant calcification with near occlusive stenosis.   Her right carotid surgery earlier this year was canceled as she was a trauma as noted.  She represented to discuss surgery.   Since recent evaluation in the office, I recommended a repeat carotid ultrasound as had been some interval time since last evaluation.  Duplex shows her right carotid is now occluded.  I discussed there is no role for revascularization of her occluded artery as this was a silent occlusion.  Focus is now on the left side.  The left side has significantly less calcium  but still a high grade stenosis.  I reviewed her images.  Discussed she would be a candidate for TCAR or carotid endarterectomy on the left.  She favors the more minimally endovascular approach.  Discussed stenting with flow reversal using TCAR as an option.  Plan left TCAR today. Discussed 1% stroke risk along with other operative risks.  Lonni DOROTHA Gaskins, MD [facility] [place] Office: [phone]  Lonni JINNY Gaskins

## 2024-02-12 NOTE — Op Note (Signed)
 Date: February 12, 2024  Preoperative diagnosis: High-grade asymptomatic left internal carotid artery stenosis greater than 80%  Postoperative diagnosis: Same  Procedure: 1.  Ultrasound-guided access left common femoral vein for delivery of TCAR venous sheath 2.  Transcatheter placement of left cervical carotid artery stent including angioplasty with flow reversal for distal embolic protection (left TCAR)  Surgeon: Dr. Lonni DOROTHA Gaskins, MD  Assistant: Lucie Apt, PA  Indications: 63 year old female seen with bilateral high-grade carotid artery stenosis.  Initial plan was right carotid endarterectomy earlier this year but she sustained a trauma with rib fractures and was hospitalized and this was canceled.  On subsequent follow-up found to have occlusion of her right carotid that was silent.  She still had a high-grade left ICA stenosis.  She presents for left carotid revascularization today with plans for TCAR after risk benefits discussed.  An assistant was needed given the complexity the case and also for wire and sheath exchange.  Findings: Ultrasound-guided access left common femoral vein for delivery of TCAR venous sheath.  Transverse incision above the left clavicle.  Cutdown on the common carotid artery and this was accessed percutaneously.  I had to engage the external carotid artery to get the working sheath placed given a short common carotid artery.  Once we were on active flow reversal with a working sheath in place I was able to cross the left ICA high grade stenosis that was high-grade and this was then treated with a 5 mm x 35 mm angioplasty balloon and then stented with a tapered 9 mm - 7 mm x 40 mm Enroute stent.  Widely patent stent at completion.  Awoke neuro intact.  Anesthesia: General  Details: Patient was taken to the operating room after informed consent was obtained.  Placed on the operative table in supine position.  General endotracheal anesthesia was induced.  I  went ahead and marked the common carotid artery above the left clavicle with ultrasound.  Bilateral groins and left neck were then prepped and draped in standard sterile fashion.  Antibiotics were given and timeout performed.  I went ahead and evaluated the left common femoral vein with ultrasound, it was patent, an image was saved.  This was accessed with a micro access needle and placed a microwire micro sheath.  I then used a Bentson wire and exchanged for the TCAR venous sheath in the left common femoral vein and this flushed and aspirated easily.  Then turned our attention to the left neck and we performed a transverse incision above the left clavicle and dissected down through the platysma with Bovie cautery.  I went between the heads of the sternocleidomastoid and mobilized the internal jugular vein laterally and preserved the vagus nerve.  The common carotid artery was then dissected out in the base of the wound controlled with an umbilical tape and a vessel loop.  The patient was given 100 units/kg IV heparin .  ACT was checked to maintain greater than 250.  I then placed a pursestring on the anterior wall of the common carotid artery with 5-0 Prolene.  Given the short common carotid, I elected to tunnel through the skin  so I used a micro access needle tunneled through the skin and accessed the common carotid artery through the pursestring placed a microwire and micro sheath.  I then used a mobile C arm and got a angiogram to identify the carotid bifurcation as well as the external carotid artery that was widely patent.  I advanced my wire into  the external carotid artery and advanced the micro sheath to give us  more runway.  I then used a J-wire and exchanged for the working sheath that again was tunneled through the skin and placed into the common carotid artery.  This was secured with multiple 3-0 silk sutures.  I then connected the filter to the groin and we were on passive flow reversal.  I then got  additional images identified a high-grade internal carotid artery stenosis.  We then clamped the common carotid artery and went on active flow reversal after we had a therapeutic ACT greater than 250 and a TCAR timeout.  The lesion was then crossed using an angled catheter into the ICA.  The proximal ICA high-grade stenosis was treated with a 5 mm x 35 mm angioplasty balloon.  This was then stented with a 9 mm - 7 mm x 40 mm tapered Enroute stent with the help of my assistant.  We allowed 2 minutes of flow reversal.  Widely patent stent at completion.  Wires and catheters removed and we came off clamp.  The arterial sheath was removed after we gave the blood back in the femoral sheath.  I tied down the pursestring with good hemostasis.  [place] with pencil Doppler had good flow.  Protamine  was given for reversal.  Surgicel snow was placed in the neck incision I closed the platysma with 3-0 Vicryl and the skin with 4-0 Monocryl.  The sheath in the groin was was removed manual pressure was held for 5 minutes.  She was awake and neurologically intact.  Taken to recovery in stable condition.  Complication: None  Condition: Stable  Lonni DOROTHA Gaskins, MD [facility] [place] Office: [phone]   Lonni JINNY Gaskins

## 2024-02-13 ENCOUNTER — Encounter (HOSPITAL_COMMUNITY): Payer: Self-pay | Admitting: Vascular Surgery

## 2024-02-13 DIAGNOSIS — Z48812 Encounter for surgical aftercare following surgery on the circulatory system: Secondary | ICD-10-CM

## 2024-02-13 DIAGNOSIS — Z95828 Presence of other vascular implants and grafts: Secondary | ICD-10-CM

## 2024-02-13 LAB — CBC
HCT: 41.6 % (ref 36.0–46.0)
Hemoglobin: 13.9 g/dL (ref 12.0–15.0)
MCH: 31.4 pg (ref 26.0–34.0)
MCHC: 33.4 g/dL (ref 30.0–36.0)
MCV: 94.1 fL (ref 80.0–100.0)
Platelets: 248 K/uL (ref 150–400)
RBC: 4.42 MIL/uL (ref 3.87–5.11)
RDW: 19.7 % — ABNORMAL HIGH (ref 11.5–15.5)
WBC: 11.4 K/uL — ABNORMAL HIGH (ref 4.0–10.5)
nRBC: 0 % (ref 0.0–0.2)

## 2024-02-13 LAB — BASIC METABOLIC PANEL WITH GFR
Anion gap: 9 (ref 5–15)
BUN: 8 mg/dL (ref 8–23)
CO2: 24 mmol/L (ref 22–32)
Calcium: 9 mg/dL (ref 8.9–10.3)
Chloride: 97 mmol/L — ABNORMAL LOW (ref 98–111)
Creatinine, Ser: 0.7 mg/dL (ref 0.44–1.00)
GFR, Estimated: >60 mL/min (ref >60)
Glucose, Bld: 128 mg/dL — ABNORMAL HIGH (ref 70–99)
Potassium: 5 mmol/L (ref 3.5–5.1)
Sodium: 130 mmol/L — ABNORMAL LOW (ref 135–145)

## 2024-02-13 LAB — LIPID PANEL
Cholesterol: 137 mg/dL (ref 0–200)
HDL: 56 mg/dL (ref >40)
LDL Cholesterol: 67 mg/dL (ref 0–99)
Total CHOL/HDL Ratio: 2.4 ratio
Triglycerides: 70 mg/dL (ref <150)
VLDL: 14 mg/dL (ref 0–40)

## 2024-02-13 LAB — MAGNESIUM: Magnesium: 2.4 mg/dL (ref 1.7–2.4)

## 2024-02-13 LAB — T4, FREE: Free T4: 1.01 ng/dL (ref 0.61–1.12)

## 2024-02-13 MED ORDER — SALINE SPRAY 0.65 % NA SOLN
1.0000 | NASAL | Status: DC | PRN
  Filled 2024-02-13: qty 44

## 2024-02-13 MED ORDER — GUAIFENESIN ER 600 MG PO TB12
1200.0000 mg | ORAL_TABLET | Freq: Two times a day (BID) | ORAL | Status: DC
  Administered 2024-02-13 – 2024-02-14 (×2): 1200 mg via ORAL
  Filled 2024-02-13 (×2): qty 2

## 2024-02-13 MED ORDER — METOPROLOL TARTRATE 12.5 MG HALF TABLET
12.5000 mg | ORAL_TABLET | Freq: Two times a day (BID) | ORAL | Status: DC
  Administered 2024-02-13 – 2024-02-14 (×2): 12.5 mg via ORAL
  Filled 2024-02-13 (×2): qty 1

## 2024-02-13 MED ORDER — ALPRAZOLAM 0.5 MG PO TABS
0.5000 mg | ORAL_TABLET | Freq: Two times a day (BID) | ORAL | Status: DC | PRN
  Administered 2024-02-13: 0.5 mg via ORAL
  Filled 2024-02-13: qty 1

## 2024-02-13 MED ORDER — MENTHOL 3 MG MT LOZG
1.0000 | LOZENGE | OROMUCOSAL | Status: DC | PRN
  Filled 2024-02-13: qty 9

## 2024-02-13 NOTE — Progress Notes (Signed)
 Mobility Specialist Progress Note:   02/13/24 0916  Mobility  Activity Ambulated with assistance in room;Ambulated with assistance in hallway  Level of Assistance Modified independent, requires aide device or extra time  Assistive Device Other (Comment) (hand rails)  Distance Ambulated (ft) 200 ft  Activity Response Tolerated well  Mobility Referral Yes  Mobility visit 1 Mobility  Mobility Specialist Start Time (ACUTE ONLY) [id number]  Mobility Specialist Stop Time (ACUTE ONLY) 0926  Mobility Specialist Time Calculation (min) (ACUTE ONLY) 10 min   Pt received in bed, agreeable to mobility session. Ambulated in hallway with ModI using hand rails. Tolerated well, monitored O2 sats throughout. C/o leg weakness. Returned pt to room, left with all needs met.  Pre Mobility: SpO2 90% on RA During Mobility:SpO2 84% on RA, recovered after pursed lip breathing, SpO2 92% on RA  Julianna Vanwagner Mobility Specialist Please contact via Special educational needs teacher or  Rehab office at [phone]

## 2024-02-13 NOTE — Progress Notes (Signed)
 Rn received call from CCMD pt in Afib, RN at bedside pt HR elevated 120s, Dr. Carolyne at bedside EKG on chart

## 2024-02-13 NOTE — Plan of Care (Signed)
  Problem: Education: Goal: Knowledge of General Education information will improve Description: Including pain rating scale, medication(s)/side effects and non-pharmacologic comfort measures Outcome: Progressing   Problem: Clinical Measurements: Goal: Respiratory complications will improve Outcome: Progressing   Problem: Activity: Goal: Risk for activity intolerance will decrease Outcome: Progressing   Problem: Elimination: Goal: Will not experience complications related to urinary retention Outcome: Progressing   Problem: Pain Managment: Goal: General experience of comfort will improve and/or be controlled Outcome: Not Progressing

## 2024-02-13 NOTE — Progress Notes (Addendum)
 Rounding Note    Patient Name: Denise [name] Date of Encounter: 02/13/2024  [facility] Cardiologist: Shelda Bruckner, MD   Subjective   No acute events overnight. Throat feels like there is some mucus/post nasal drip but otherwise feels well. Reviewed telemetry and plans, see below.  Inpatient Medications    Scheduled Meds:  amLODipine   10 mg Oral Daily   aspirin  EC  162 mg Oral Daily   atorvastatin   80 mg Oral Daily   busPIRone   5 mg Oral BID   clopidogrel   75 mg Oral Daily   docusate sodium   100 mg Oral Daily   escitalopram   20 mg Oral Daily   famotidine   20 mg Oral Daily   ferrous sulfate   325 mg Oral Q breakfast   fluticasone  furoate-vilanterol  1 puff Inhalation Daily   lisinopril   30 mg Oral Daily   loratadine   10 mg Oral Daily   metoprolol  tartrate  12.5 mg Oral BID   Continuous Infusions:  sodium chloride      PRN Meds: sodium chloride , acetaminophen  **OR** acetaminophen , albuterol , ALPRAZolam , bisacodyl , diphenhydrAMINE , hydrALAZINE , labetalol , menthol -cetylpyridinium, metoprolol  tartrate, morphine  injection, ondansetron , oxyCODONE -acetaminophen , phenol, polyethylene glycol, potassium chloride , sodium chloride    Vital Signs    Vitals:   02/13/24 0756 02/13/24 1308 02/13/24 1538 02/13/24 1643  BP: 105/64 115/68 124/89 130/84  Pulse: 93  (!) 106   Resp: 18 20 20    Temp: 97.7 F (36.5 C) 97.7 F (36.5 C) (!) 97.5 F (36.4 C)   TempSrc: Oral Oral Oral   SpO2: 90% 91% 96% 91%  Weight:      Height:        Intake/Output Summary (Last 24 hours) at 02/13/2024 1808 Last data filed at 02/13/2024 0700 Gross per 24 hour  Intake 442.57 ml  Output --  Net 442.57 ml      02/12/2024    6:32 AM 02/03/2024    8:54 AM 01/31/2024   11:01 AM  Last 3 Weights  Weight (lbs) 163 lb 163 lb 12.8 oz 163 lb  Weight (kg) 73.936 kg 74.299 kg 73.936 kg      Telemetry    SR, very rare PVCs. PACs are intermittent--periods of few to no PACs, then  periods like this AM when she is having frequent PACs - Personally Reviewed  Physical Exam   GEN: No acute distress.   Neck: No JVD visible. L side with ecchymosis Cardiac: RRR, soft systolic murmur Respiratory: Clear to auscultation bilaterally. GI: Soft, nontender, non-distended  MS: No edema; No deformity. Neuro:  Nonfocal  Psych: Normal affect   New pertinent results (labs, ECG, imaging, cardiac studies)     Assessment & Plan    PVCs, PACs - Incidentally noted intraoperatively and in recovery 7/9 - Telemetry in the room has showed occasional PACs and PVCs. Today PVCs are very rare, PACs have variable frequency - On review of her records, she has had occasional ectopy for several years. - Reassuring that her EF is normal and she is asymptomatic - thyroid, electrolytes checked. Received Mg yesterday, normal today. TSH was low but T4 normal. - Blood pressure remains low. Given that her EF is normal and she is asymptomatic, we will hold on beta-blocker and follow-up as outpatient, possibly with outpatient ZIO monitor to evaluate for burden - reviewed symptoms that would need her to contact us    CAD based on coronary CT Bilateral carotid disease, R occluded, L s/p TCAR PAD without symptoms - Status post left TCAR today.SABRA  Management per vascular surgery - Continue atorvastatin  - lpa pending given extent of atherosclerotic disease - LDL 67, TG 70, HDL 56 - follows for carotids and PAD with Dr. Gretta   Moderate aortic stenosis - Repeat echo when clinically indicated as an outpatient  Planned for discharge later today. She has follow up scheduled with Katlyn West on 02/27/24. Cardiology will sign off  UPDATE TO ADD: Called by team that just as she was getting ready for discharge, she went into afib. Went back to see the patient, discussed. She cannot feel the afib. Blood pressure is improved, will start low dose metoprolol . Discussed with vascular team, will hold on starting  anticoagulation until tomorrow.    Signed, Shelda Bruckner, MD  02/13/2024, 6:08 PM

## 2024-02-13 NOTE — Progress Notes (Deleted)
 Rn received call from CCMD pt in Afib, RN at bedside pt HR elevated 120s, Dr. Carolyne at bedside EKG on chart

## 2024-02-13 NOTE — Progress Notes (Addendum)
  Progress Note    02/13/2024 7:46 AM 1 Day Post-Op  Subjective:  hoarse voice but improving.  No events overnight   Vitals:   02/13/24 0500 02/13/24 0600  BP:    Pulse: 85 86  Resp: 17 17  Temp:    SpO2: 92% 94%   Physical Exam: Lungs:  non labored Incisions:  L neck with ecchymosis but no firm hematoma  Extremities:  moving all ext well Neurologic: CN grossly intact  CBC    Component Value Date/Time   WBC 11.4 (H) 02/13/2024 0315   RBC 4.42 02/13/2024 0315   HGB 13.9 02/13/2024 0315   HCT 41.6 02/13/2024 0315   PLT 248 02/13/2024 0315   MCV 94.1 02/13/2024 0315   MCH 31.4 02/13/2024 0315   MCHC 33.4 02/13/2024 0315   RDW 19.7 (H) 02/13/2024 0315   LYMPHSABS 1.0 11/21/2023 1254   MONOABS 0.6 11/21/2023 1254   EOSABS 196 11/29/2023 1516   BASOSABS 69 11/29/2023 1516    BMET    Component Value Date/Time   NA 130 (L) 02/13/2024 0315   NA 132 (L) 09/06/2023 1553   K 5.0 02/13/2024 0315   CL 97 (L) 02/13/2024 0315   CO2 24 02/13/2024 0315   GLUCOSE 128 (H) 02/13/2024 0315   BUN 8 02/13/2024 0315   BUN 11 09/06/2023 1553   CREATININE 0.70 02/13/2024 0315   CREATININE 0.54 11/29/2023 1516   CALCIUM  9.0 02/13/2024 0315   GFRNONAA >60 02/13/2024 0315   GFRAA >60 12/24/2019 0326    INR    Component Value Date/Time   INR 1.0 02/03/2024 1000     Intake/Output Summary (Last 24 hours) at 02/13/2024 0746 Last data filed at 02/13/2024 0300 Gross per 24 hour  Intake 1702.57 ml  Output 25 ml  Net 1677.57 ml     Assessment/Plan:  63 y.o. female is s/p L TCAR for asymptomatic high grade stenosis 1 Day Post-Op   No neuro events overnight; moving all extremities well BP better this morning; ok to d/c a line if cuff is correlating; discontinue pseudoephedrine ; will defer decision on low dose beta blockade to Cardiology L neck incision bruised but no firm hematoma; hoarse voice subjectively improving Ambulate this morning; discharge home this afternoon vs  tomorrow   Donnice Sender, PA-C [facility] [phone] 02/13/2024 7:46 AM  I have seen and evaluated the patient. I agree with the PA note as documented above.  Postop day 1 status post left TCAR for asymptomatic high-grade stenosis with contralateral carotid occlusion.  Overall looks good this morning and grossly neurologically intact.  Has a lot of bruising from her left neck incision and also the tunneling site on her left chest wall.  This is all stable.  Discussed aspirin  statin Plavix  at discharge for stent patency.  Will see how she does through lunch this morning.  She wants to go home.  Will stop the Sudafed and make sure her blood pressures are okay.  Usually related to baroreceptors after surgery as we discussed and this improves.  Does have a little bit of mild hoarseness but I would expect this to improve and she had significant respiratory issues in recovery due to underlying COPD.  Breathing much better today.  Discussed will arrange follow-up in one month with carotid duplex.  Lonni DOROTHA Gaskins, MD [facility] [place] Office: [phone]

## 2024-02-14 ENCOUNTER — Other Ambulatory Visit (HOSPITAL_COMMUNITY): Payer: Self-pay

## 2024-02-14 ENCOUNTER — Telehealth (HOSPITAL_COMMUNITY): Payer: Self-pay | Admitting: Pharmacy Technician

## 2024-02-14 DIAGNOSIS — D6869 Other thrombophilia: Secondary | ICD-10-CM

## 2024-02-14 DIAGNOSIS — I480 Paroxysmal atrial fibrillation: Secondary | ICD-10-CM

## 2024-02-14 LAB — BASIC METABOLIC PANEL WITH GFR
Anion gap: 8 (ref 5–15)
BUN: 5 mg/dL — ABNORMAL LOW (ref 8–23)
CO2: 25 mmol/L (ref 22–32)
Calcium: 9 mg/dL (ref 8.9–10.3)
Chloride: 99 mmol/L (ref 98–111)
Creatinine, Ser: 0.49 mg/dL (ref 0.44–1.00)
GFR, Estimated: >60 mL/min (ref >60)
Glucose, Bld: 93 mg/dL (ref 70–99)
Potassium: 4.3 mmol/L (ref 3.5–5.1)
Sodium: 132 mmol/L — ABNORMAL LOW (ref 135–145)

## 2024-02-14 LAB — LIPOPROTEIN A (LPA): Lipoprotein (a): 194.5 nmol/L — ABNORMAL HIGH (ref <75.0)

## 2024-02-14 MED ORDER — APIXABAN 5 MG PO TABS
5.0000 mg | ORAL_TABLET | Freq: Two times a day (BID) | ORAL | Status: DC
  Administered 2024-02-14: 5 mg via ORAL
  Filled 2024-02-14: qty 1

## 2024-02-14 MED ORDER — OXYCODONE-ACETAMINOPHEN 5-325 MG PO TABS
1.0000 | ORAL_TABLET | Freq: Four times a day (QID) | ORAL | 0 refills | Status: DC | PRN
  Filled 2024-02-14: qty 15, 4d supply, fill #0

## 2024-02-14 MED ORDER — METOPROLOL TARTRATE 25 MG PO TABS
25.0000 mg | ORAL_TABLET | Freq: Two times a day (BID) | ORAL | 0 refills | Status: DC
  Filled 2024-02-14: qty 60, 30d supply, fill #0

## 2024-02-14 MED ORDER — APIXABAN 5 MG PO TABS
5.0000 mg | ORAL_TABLET | Freq: Two times a day (BID) | ORAL | 1 refills | Status: DC
  Filled 2024-02-14: qty 60, 30d supply, fill #0

## 2024-02-14 MED ORDER — METOPROLOL TARTRATE 25 MG PO TABS: 25.0000 mg | ORAL_TABLET | Freq: Two times a day (BID) | ORAL | Status: DC

## 2024-02-14 MED ORDER — METOPROLOL TARTRATE 12.5 MG HALF TABLET
12.5000 mg | ORAL_TABLET | Freq: Once | ORAL | Status: AC
  Administered 2024-02-14: 12.5 mg via ORAL
  Filled 2024-02-14: qty 1

## 2024-02-14 NOTE — Progress Notes (Signed)
 Rounding Note    Patient Name: Denise [name] Date of Encounter: 02/14/2024  [facility] Cardiologist: Shelda Bruckner, MD   Subjective   See note from yesterday, went into afib later in the day. Started metoprolol , she has been in and out of afib this AM. HR in afib goes to the 130s when ambulating. Discussed options, see below. She cannot feel the afib.  Inpatient Medications    Scheduled Meds:  amLODipine   10 mg Oral Daily   apixaban   5 mg Oral BID   aspirin  EC  162 mg Oral Daily   atorvastatin   80 mg Oral Daily   busPIRone   5 mg Oral BID   clopidogrel   75 mg Oral Daily   docusate sodium   100 mg Oral Daily   escitalopram   20 mg Oral Daily   famotidine   20 mg Oral Daily   ferrous sulfate   325 mg Oral Q breakfast   fluticasone  furoate-vilanterol  1 puff Inhalation Daily   guaiFENesin   1,200 mg Oral BID   lisinopril   30 mg Oral Daily   loratadine   10 mg Oral Daily   metoprolol  tartrate  12.5 mg Oral Once   metoprolol  tartrate  25 mg Oral BID   Continuous Infusions:  sodium chloride      PRN Meds: sodium chloride , acetaminophen  **OR** acetaminophen , albuterol , ALPRAZolam , bisacodyl , diphenhydrAMINE , hydrALAZINE , labetalol , menthol -cetylpyridinium, metoprolol  tartrate, morphine  injection, ondansetron , oxyCODONE -acetaminophen , phenol, polyethylene glycol, potassium chloride , sodium chloride    Vital Signs    Vitals:   02/13/24 2155 02/13/24 2346 02/14/24 0439 02/14/24 0747  BP: 124/84 (!) 139/94 133/79 105/76  Pulse: 90 (!) 117 (!) 110   Resp: 18 20 18 20   Temp: 97.6 F (36.4 C) 97.9 F (36.6 C) 97.7 F (36.5 C) 97.7 F (36.5 C)  TempSrc: Oral Oral Oral Oral  SpO2: 94% 95% 95% 97%  Weight:      Height:        Intake/Output Summary (Last 24 hours) at 02/14/2024 1008 Last data filed at 02/13/2024 1922 Gross per 24 hour  Intake 153.5 ml  Output --  Net 153.5 ml      02/12/2024    6:32 AM 02/03/2024    8:54 AM 01/31/2024   11:01 AM  Last 3  Weights  Weight (lbs) 163 lb 163 lb 12.8 oz 163 lb  Weight (kg) 73.936 kg 74.299 kg 73.936 kg      Telemetry    Intermittently atrial fibrillation and sinus - Personally Reviewed  Physical Exam   GEN: Well nourished, well developed in no acute distress NECK: No JVD. L neck with ecchymoses CARDIAC: irregularly irregular rhythm, normal S1 and S2, no rubs or gallops. Soft systolic murmur. VASCULAR: Radial pulses 2+ bilaterally.  RESPIRATORY:  Somewhat distant breath sounds, no wheezing ABDOMEN: Soft, non-tender, non-distended MUSCULOSKELETAL:  Moves all 4 limbs independently SKIN: Warm and dry, no edema NEUROLOGIC:  No focal neuro deficits noted. PSYCHIATRIC:  Normal affect    New pertinent results (labs, ECG, imaging, cardiac studies)     Assessment & Plan    PVCs, PACs New atrial fibrillation - Incidentally noted ventricular bigeminy intraoperatively and in recovery 7/9 - went into afib afternoon of 7/10 - EF is normal and she is asymptomatic - thyroid, electrolytes checked. Received Mg yesterday, normal today. TSH was low but T4 normal. - started low dose metoprolol , tolerated well, but still with RVR with ambulation. Increased metoprolol  to 25 mg BID today - discussed possible antiarrhythmic with her. With her COPD, both  amiodarone  and sotalol not ideal. No 1c agents given her CAD. Given that she is going in and out, I suspect that as she recovers from her TCAR the afib may resolve. I do think she is at high risk for afib as a long term issue given her comorbidities.  - CHA2DS2/VAS Stroke Risk Points= 3 - discussed with vascular team yesterday, waited to start anticoagulation until this AM. Pharmacy ran costs, both apixaban  and rivaroxaban are free. She already takes BID meds and feels this is easier than taking once daily at a meal, so will start apixaban  - she understands that she will be on apixaban , aspirin , and clopidogrel  for a month. High risk of bruising/bleeding,  reviewed what to watch for   CAD based on coronary CT Bilateral carotid disease, R occluded, L s/p TCAR PAD without symptoms - Status post left TCAR.  Management per vascular surgery - Continue atorvastatin  - lpa pending given extent of atherosclerotic disease - LDL 67, TG 70, HDL 56 - follows for carotids and PAD with Dr. Gretta   Moderate aortic stenosis - Repeat echo when clinically indicated as an outpatient  [facility] will sign off.   Medication Recommendations:  Started apixaban  5 mg BID, metoprolol  25 mg BID this admission. Aspirin , clopidogrel  per vascular. Continue atorvastatin  80 mg daily, lisinopril  30 mg daily Other recommendations (labs, testing, etc):  follow up results of lpa Follow up as an outpatient:  She has follow up with Katyln West on 02/27/24. She would like to follow up with me long term now that Dr. Alveta has retired.     Signed, Shelda Bruckner, MD  02/14/2024, 10:08 AM

## 2024-02-14 NOTE — Plan of Care (Signed)
  Problem: Education: Goal: Knowledge of General Education information will improve Description: Including pain rating scale, medication(s)/side effects and non-pharmacologic comfort measures Outcome: Adequate for Discharge   Problem: Health Behavior/Discharge Planning: Goal: Ability to manage health-related needs will improve Outcome: Adequate for Discharge   Problem: Clinical Measurements: Goal: Ability to maintain clinical measurements within normal limits will improve Outcome: Adequate for Discharge Goal: Will remain free from infection Outcome: Adequate for Discharge Goal: Diagnostic test results will improve Outcome: Adequate for Discharge Goal: Respiratory complications will improve Outcome: Adequate for Discharge Goal: Cardiovascular complication will be avoided Outcome: Adequate for Discharge   Problem: Activity: Goal: Risk for activity intolerance will decrease Outcome: Adequate for Discharge   Problem: Nutrition: Goal: Adequate nutrition will be maintained Outcome: Adequate for Discharge   Problem: Coping: Goal: Level of anxiety will decrease Outcome: Adequate for Discharge   Problem: Elimination: Goal: Will not experience complications related to bowel motility Outcome: Adequate for Discharge Goal: Will not experience complications related to urinary retention Outcome: Adequate for Discharge   Problem: Pain Managment: Goal: General experience of comfort will improve and/or be controlled Outcome: Adequate for Discharge   Problem: Safety: Goal: Ability to remain free from injury will improve Outcome: Adequate for Discharge   Problem: Skin Integrity: Goal: Risk for impaired skin integrity will decrease Outcome: Adequate for Discharge   Problem: Education: Goal: Knowledge of discharge needs will improve Outcome: Adequate for Discharge   Problem: Clinical Measurements: Goal: Postoperative complications will be avoided or minimized Outcome: Adequate for  Discharge   Problem: Respiratory: Goal: Will achieve and/or maintain a regular respiratory rate, without signs or symptoms of dyspnea Outcome: Adequate for Discharge   Problem: Skin Integrity: Goal: Demonstration of wound healing without infection will improve Outcome: Adequate for Discharge   Problem: Education: Goal: Knowledge of disease or condition will improve Outcome: Adequate for Discharge Goal: Knowledge of secondary prevention will improve (MUST DOCUMENT ALL) Outcome: Adequate for Discharge Goal: Knowledge of patient specific risk factors will improve (DELETE if not current risk factor) Outcome: Adequate for Discharge   Problem: Ischemic Stroke/TIA Tissue Perfusion: Goal: Complications of ischemic stroke/TIA will be minimized Outcome: Adequate for Discharge   Problem: Coping: Goal: Will verbalize positive feelings about self Outcome: Adequate for Discharge Goal: Will identify appropriate support needs Outcome: Adequate for Discharge   Problem: Self-Care: Goal: Ability to participate in self-care as condition permits will improve Outcome: Adequate for Discharge Goal: Verbalization of feelings and concerns over difficulty with self-care will improve Outcome: Adequate for Discharge Goal: Ability to communicate needs accurately will improve Outcome: Adequate for Discharge   Problem: Nutrition: Goal: Risk of aspiration will decrease Outcome: Adequate for Discharge Goal: Dietary intake will improve Outcome: Adequate for Discharge

## 2024-02-14 NOTE — Progress Notes (Addendum)
  Progress Note    02/14/2024 7:37 AM 2 Days Post-Op  Subjective:  headache this morning but relieved with medication.  Denies stroke like symptoms overnight   Vitals:   02/13/24 2346 02/14/24 0439  BP: (!) 139/94 133/79  Pulse: (!) 117 (!) 110  Resp: 20 18  Temp: 97.9 F (36.6 C) 97.7 F (36.5 C)  SpO2: 95% 95%   Physical Exam: Lungs:  non labored Incisions:  L neck without hematoma despite bruising Extremities:  moving all ext well Neurologic: CN grossly intact  CBC    Component Value Date/Time   WBC 11.4 (H) 02/13/2024 0315   RBC 4.42 02/13/2024 0315   HGB 13.9 02/13/2024 0315   HCT 41.6 02/13/2024 0315   PLT 248 02/13/2024 0315   MCV 94.1 02/13/2024 0315   MCH 31.4 02/13/2024 0315   MCHC 33.4 02/13/2024 0315   RDW 19.7 (H) 02/13/2024 0315   LYMPHSABS 1.0 11/21/2023 1254   MONOABS 0.6 11/21/2023 1254   EOSABS 196 11/29/2023 1516   BASOSABS 69 11/29/2023 1516    BMET    Component Value Date/Time   NA 132 (L) 02/14/2024 0338   NA 132 (L) 09/06/2023 1553   K 4.3 02/14/2024 0338   CL 99 02/14/2024 0338   CO2 25 02/14/2024 0338   GLUCOSE 93 02/14/2024 0338   BUN 5 (L) 02/14/2024 0338   BUN 11 09/06/2023 1553   CREATININE 0.49 02/14/2024 0338   CREATININE 0.54 11/29/2023 1516   CALCIUM  9.0 02/14/2024 0338   GFRNONAA >60 02/14/2024 0338   GFRAA >60 12/24/2019 0326    INR    Component Value Date/Time   INR 1.0 02/03/2024 1000     Intake/Output Summary (Last 24 hours) at 02/14/2024 0737 Last data filed at 02/13/2024 1922 Gross per 24 hour  Intake 153.5 ml  Output --  Net 153.5 ml     Assessment/Plan:  63 y.o. female is s/p L TCAR 2 Days Post-Op   Neuro exam remains at baseline Hoarse voice improving Discharge last night was cancelled due to new afib; appears to be in sinus rhythm this morning;  will check pricing for DOAC with pharmacy; appreciate Cardiology assistance Continue aspirin  and plavix  in addition to DOAC for 1 month Possible d/c  home this afternoon   Denise Sender, Denise [name] [facility] [phone] 02/14/2024 7:37 AM   I have seen and evaluated the patient. I agree with the PA note as documented above.  Postop day 2 status post left TCAR for high-grade asymptomatic stenosis with contralateral occlusion.  Plan was discharge yesterday and she went into A-fib RVR at time of discharge.  Appreciate cardiology input.  On low-dose beta-blocker and heart rate controlled today.  Okay starting DOAC today.  Discussed she would be on triple therapy with aspirin  Plavix  DOAC for 1 month and then as if her duplex looks okay at 1 month we can stop the Plavix .  Hopefully discharge today once cardiology sees her.  Duplex in 1 month in the office.  Neuro intact today.  Hoarseness much improved.  Lonni DOROTHA Gaskins, MD [facility] [place] Office: [phone]

## 2024-02-14 NOTE — Progress Notes (Signed)
 Reviewed AVS, patient expressed understanding of medications, MD follow up reviewed.   Removed IV, Site clean, dry and intact.  Patient states all belongings brought to the hospital at time of admission are accounted for and packed to take home.  Patient informed and expressed understanding to pick up medications from [facility] pharmacy before leaving hospital.  Nurse needs to reassess pain before patient is discharged.

## 2024-02-14 NOTE — TOC Transition Note (Signed)
 Transition of Care (TOC) - Discharge Note Rayfield Gobble RN, BSN Inpatient Care Management Unit 4E- RN Case Manager See Treatment Team for direct phone #   Patient Details  Name: Denise [name] MRN: [id number] Date of Birth: 16-Oct-1960  [facility]) CM/SW Contact:  Gobble Rayfield Hurst, RN Phone Number: 02/14/2024, 12:10 PM   Clinical Narrative:    Transition of Care [facility]) - Inpatient Brief Assessment   Patient Details  Name: Denise [name] MRN: [id number] Date of Birth: 04/27/1961  Transition of Care [facility]) CM/SW Contact:    Gobble Rayfield Hurst, RN Phone Number: 02/14/2024, 12:10 PM   Clinical Narrative: Pt stable for transition home today s/p TCAR,  no HH or DME needs noted.    Transition of Care Asessment: Insurance and Status: Insurance coverage has been reviewed Patient has primary care physician: Yes Home environment has been reviewed: Yes Prior level of function:: Modified Independent Prior/Current Home Services: No current home services Social Drivers of Health Review: SDOH reviewed no interventions necessary Readmission risk has been reviewed: Yes Transition of care needs: no transition of care needs at this time     Final next level of care: Home/Self Care Barriers to Discharge: No Barriers Identified   Patient Goals and CMS Choice Patient states their goals for this hospitalization and ongoing recovery are:: return home   Choice offered to / list presented to : NA      Discharge Placement                 Home      Discharge Plan and Services Additional resources added to the After Visit Summary for       Post Acute Care Choice: NA                               Social Drivers of Health (SDOH) Interventions SDOH Screenings   Food Insecurity: Food Insecurity Present (02/13/2024)  Housing: High Risk (01/28/2024)  Transportation Needs: No Transportation Needs (02/13/2024)  Recent Concern: Transportation Needs -  Unmet Transportation Needs (11/29/2023)  Alcohol Screen: Low Risk  (01/28/2024)  Depression (PHQ2-9): High Risk (01/31/2024)  Financial Resource Strain: Patient Declined (01/28/2024)  Physical Activity: Insufficiently Active (01/28/2024)  Social Connections: Socially Isolated (01/28/2024)  Stress: Stress Concern Present (01/28/2024)  Tobacco Use: Medium Risk (02/12/2024)     Readmission Risk Interventions    02/13/2024    3:05 PM  Readmission Risk Prevention Plan  Post Dischage Appt Complete  Medication Screening Complete  Transportation Screening Complete

## 2024-02-14 NOTE — Progress Notes (Signed)
 PHARMACIST LIPID MONITORING   Denise [name] is a 63 y.o. female admitted on 02/12/2024 with s/p L TCAR.  Pharmacy has been consulted to optimize lipid-lowering therapy with the indication of secondary prevention for clinical ASCVD.  Recent Labs:  Lipid Panel (last 6 months):   Lab Results  Component Value Date   CHOL 137 02/13/2024   TRIG 70 02/13/2024   HDL 56 02/13/2024   CHOLHDL 2.4 02/13/2024   VLDL 14 02/13/2024   LDLCALC 67 02/13/2024    Hepatic function panel (last 6 months):   Lab Results  Component Value Date   AST 29 02/03/2024   ALT 20 02/03/2024   ALKPHOS 86 02/03/2024   BILITOT 0.7 02/03/2024    SCr (since admission):   Serum creatinine: 0.49 mg/dL 92/88/74 9661 Estimated creatinine clearance: 70.2 mL/min  Current therapy and lipid therapy tolerance Current lipid-lowering therapy: Lipitor  80 Previous lipid-lowering therapies (if applicable): NA Documented or reported allergies or intolerances to lipid-lowering therapies (if applicable): None  Assessment:   No changes needed  Plan:    1.Statin intensity (high intensity recommended for all patients regardless of the LDL):  No statin changes. The patient is already on a high intensity statin.  2.Add ezetimibe (if any one of the following):   Not indicated at this time.  3.Refer to lipid clinic:   No  4.Follow-up with:  Primary care provider - Gretta Comer POUR, NP  5.Follow-up labs after discharge:  No changes in lipid therapy, repeat a lipid panel in one year.       Dorothyann DELENA Alert, PharmD 02/14/2024, 7:44 AM

## 2024-02-14 NOTE — Telephone Encounter (Signed)
Patient Product/process development scientist completed.    The patient is insured through [employer]. Patient has [employer], may use a copay card, and/or apply for patient assistance if available.    Ran test claim for Eliquis 5 mg and the current 30 day co-pay is $0.00.  Ran test claim for Xarelto 20 mg and the current 30 day co-pay is $0.00.  This test claim was processed through [facility]- copay amounts may vary at other pharmacies due to pharmacy/plan contracts, or as the patient moves through the different stages of their insurance plan.     Roland Earl, CPHT Pharmacy Technician III Certified Patient Advocate [facility] Patient Advocate Team Direct Number: [phone]  Fax: [phone]

## 2024-02-16 ENCOUNTER — Emergency Department (HOSPITAL_COMMUNITY)
Admission: EM | Admit: 2024-02-16 | Discharge: 2024-02-16 | Disposition: A | Discharge status: Home / Self Care | Source: Home / Self Care

## 2024-02-16 ENCOUNTER — Other Ambulatory Visit: Payer: Self-pay

## 2024-02-16 ENCOUNTER — Encounter (HOSPITAL_COMMUNITY): Payer: Self-pay

## 2024-02-16 DIAGNOSIS — K921 Melena: Secondary | ICD-10-CM | POA: Insufficient documentation

## 2024-02-16 DIAGNOSIS — J449 Chronic obstructive pulmonary disease, unspecified: Secondary | ICD-10-CM | POA: Insufficient documentation

## 2024-02-16 DIAGNOSIS — I2510 Atherosclerotic heart disease of native coronary artery without angina pectoris: Secondary | ICD-10-CM | POA: Insufficient documentation

## 2024-02-16 DIAGNOSIS — Z7901 Long term (current) use of anticoagulants: Secondary | ICD-10-CM | POA: Insufficient documentation

## 2024-02-16 DIAGNOSIS — Z7982 Long term (current) use of aspirin: Secondary | ICD-10-CM | POA: Insufficient documentation

## 2024-02-16 DIAGNOSIS — Z720 Tobacco use: Secondary | ICD-10-CM | POA: Insufficient documentation

## 2024-02-16 DIAGNOSIS — I4892 Unspecified atrial flutter: Secondary | ICD-10-CM | POA: Diagnosis not present

## 2024-02-16 DIAGNOSIS — Z79899 Other long term (current) drug therapy: Secondary | ICD-10-CM | POA: Insufficient documentation

## 2024-02-16 DIAGNOSIS — I10 Essential (primary) hypertension: Secondary | ICD-10-CM | POA: Insufficient documentation

## 2024-02-16 LAB — CBC WITH DIFFERENTIAL/PLATELET
Abs Immature Granulocytes: 0.08 K/uL — ABNORMAL HIGH (ref 0.00–0.07)
Basophils Absolute: 0.1 K/uL (ref 0.0–0.1)
Basophils Relative: 1 %
Eosinophils Absolute: 0.1 K/uL (ref 0.0–0.5)
Eosinophils Relative: 1 %
HCT: 41.3 % (ref 36.0–46.0)
Hemoglobin: 13.8 g/dL (ref 12.0–15.0)
Immature Granulocytes: 1 %
Lymphocytes Relative: 11 %
Lymphs Abs: 1.4 K/uL (ref 0.7–4.0)
MCH: 31.2 pg (ref 26.0–34.0)
MCHC: 33.4 g/dL (ref 30.0–36.0)
MCV: 93.4 fL (ref 80.0–100.0)
Monocytes Absolute: 0.9 K/uL (ref 0.1–1.0)
Monocytes Relative: 7 %
Neutro Abs: 9.9 K/uL — ABNORMAL HIGH (ref 1.7–7.7)
Neutrophils Relative %: 79 %
Platelets: 328 K/uL (ref 150–400)
RBC: 4.42 MIL/uL (ref 3.87–5.11)
RDW: 19.7 % — ABNORMAL HIGH (ref 11.5–15.5)
WBC: 12.4 K/uL — ABNORMAL HIGH (ref 4.0–10.5)
nRBC: 0 % (ref 0.0–0.2)

## 2024-02-16 LAB — POC OCCULT BLOOD, ED: Fecal Occult Bld: POSITIVE — AB

## 2024-02-16 LAB — COMPREHENSIVE METABOLIC PANEL WITH GFR
ALT: 19 U/L (ref 0–44)
AST: 23 U/L (ref 15–41)
Albumin: 3.5 g/dL (ref 3.5–5.0)
Alkaline Phosphatase: 78 U/L (ref 38–126)
Anion gap: 9 (ref 5–15)
BUN: 17 mg/dL (ref 8–23)
CO2: 22 mmol/L (ref 22–32)
Calcium: 9.3 mg/dL (ref 8.9–10.3)
Chloride: 99 mmol/L (ref 98–111)
Creatinine, Ser: 0.72 mg/dL (ref 0.44–1.00)
GFR, Estimated: >60 mL/min (ref >60)
Glucose, Bld: 119 mg/dL — ABNORMAL HIGH (ref 70–99)
Potassium: 4.1 mmol/L (ref 3.5–5.1)
Sodium: 130 mmol/L — ABNORMAL LOW (ref 135–145)
Total Bilirubin: 0.7 mg/dL (ref 0.0–1.2)
Total Protein: 6.3 g/dL — ABNORMAL LOW (ref 6.5–8.1)

## 2024-02-16 LAB — MAGNESIUM: Magnesium: 1.8 mg/dL (ref 1.7–2.4)

## 2024-02-16 NOTE — Discharge Summary (Signed)
 Discharge Summary     Denise [name] 22-Mar-1961 63 y.o. female  [id number]  Admission Date: 02/12/2024  Discharge Date: 02/14/24  Physician: Dr. Gretta  Admission Diagnosis: Bilateral carotid artery stenosis [I65.23] Asymptomatic carotid artery stenosis without infarction, left [I65.22]  Discharge Day services:    See progress note 02/14/24  Hospital Course:  Denise [name] is a 63 year old female who was brought in as an outpatient and underwent left transcarotid artery revascularization by Dr. Gretta on 02/12/2024.  She tolerated the procedure well and was admitted to the hospital postoperatively.  She experienced some hypotension and was started on Sudafed.  POD #1 Sudafed was held.  On the evening of postoperative day #1 she went into atrial fibrillation and cardiology was consulted.  She was started on low-dose metoprolol .  Postoperative day #2 she remained normotensive.  She was started on Eliquis  and will take this in addition to the aspirin  and Plavix  for 1 month at which point we will discontinue the Plavix .  She was prescribed low-dose beta-blocker and will follow-up in the next few weeks with cardiology to discuss antiarrhythmics.  She will follow-up in our office in 1 month with a carotid duplex.  She was discharged home in stable condition.   Recent Labs    02/14/24 0338  NA 132*  K 4.3  CL 99  CO2 25  GLUCOSE 93  BUN 5*  CALCIUM  9.0   No results for input(s): WBC, HGB, HCT, PLT in the last 72 hours. No results for input(s): INR in the last 72 hours.     Discharge Diagnosis:  Bilateral carotid artery stenosis [I65.23] Asymptomatic carotid artery stenosis without infarction, left [I65.22]  Secondary Diagnosis: Patient Active Problem List   Diagnosis Date Noted   Asymptomatic carotid artery stenosis without infarction, left 02/12/2024   Erythema of lower extremity 11/21/2023   SIADH (syndrome of inappropriate ADH production) (HCC) 11/05/2023    Traumatic pneumothorax 10/26/2023   Fall at home, initial encounter 10/26/2023   Elevated AST (SGOT) 10/26/2023   Closed fracture dislocation of thoracic spine (HCC) 10/26/2023   Multiple fractures of ribs, right side, initial encounter for closed fracture 10/26/2023   Visual disturbance 09/18/2023   Carotid artery disease (HCC) 06/04/2023   Frequent headaches 05/23/2023   Primary osteoarthritis of first carpometacarpal joint of right hand 01/04/2023   Primary osteoarthritis of first carpometacarpal joint of left hand 01/04/2023   Right carpal tunnel syndrome 01/04/2023   Left carpal tunnel syndrome 01/04/2023   CAD (coronary artery disease) 12/27/2022   Encounter for annual general medical examination with abnormal findings in adult 12/27/2022   Chronic pain of both wrists 12/27/2022   Carotid atherosclerosis, bilateral 12/27/2022   Hyperlipidemia 12/27/2021   Iron deficiency anemia due to chronic blood loss 12/27/2021   Adnexal mass 09/08/2021   Anxiety and depression 06/16/2021   Lung nodule 12/30/2019   Sessile colonic polyp 12/30/2019   AVM (arteriovenous malformation) of colon 12/30/2019   Chest pressure 12/22/2019   Murmur 12/22/2019   Bilateral lower extremity edema 12/22/2019   Exertional dyspnea 12/22/2019   Symptomatic anemia 12/22/2019   Cardiomegaly 12/22/2019   Tobacco abuse 12/22/2019   Hyponatremia 12/22/2019   COPD (chronic obstructive pulmonary disease) (HCC) 12/22/2019   Former smoker 08/31/2019   Hypertension 03/31/2012   Past Medical History:  Diagnosis Date   Allergy    Anemia 12/24/2019   Anxiety    Aortic insufficiency    Aortic stenosis    Arthritis    Carotid artery disease (  HCC)    Centrilobular emphysema (HCC) 11/2023   Chickenpox    Cholecystitis with cholelithiasis 03/31/2012   Closed fracture of L4 transverse process s/p traumatic mechanical fall 10/26/2023   COPD (chronic obstructive pulmonary disease) (HCC)    Coronary artery disease     Depression    Diastolic dysfunction    Dyspnea    with exertion   GERD (gastroesophageal reflux disease)    Headache    Heart murmur    High anion gap metabolic acidosis 10/26/2023   History of blood transfusion 2021   HLD (hyperlipidemia)    Hypertension 03/31/2012   Hyponatremia in setting of SIADH    Left lower lobe pulmonary nodule 11/2023   Long-term use of aspirin  therapy    Lumbar compression fracture s/p traumatic mechanical fall; L4 superior endplate    Pneumonia    x 2   PVD (peripheral vascular disease) (HCC)    Shingles    SIADH (syndrome of inappropriate ADH production) (HCC)    Traumatic mechanical fall 10/26/2023   a.) s/p traumatic fall 10/26/2023 reuslting in multiple RIGHT rib (5th - 7th) fractures (required dchest tube), l$ transverse process fracture, L4 superior endplate compression fracture, large volume post-traumatic myofacial/subcutaneous gas collection in chest    Allergies as of 02/14/2024   No Known Allergies      Medication List     TAKE these medications    acetaminophen  500 MG tablet Commonly known as: TYLENOL  Take 2 tablets (1,000 mg total) by mouth every 8 (eight) hours as needed.   albuterol  108 (90 Base) MCG/ACT inhaler Commonly known as: VENTOLIN  HFA INHALE 2 PUFFS INTO THE LUNGS EVERY 4-6 HOURS AS NEEDED FOR TIGHTNESS/WHEEZING   amLODipine  10 MG tablet Commonly known as: NORVASC  TAKE 1 TABLET BY MOUTH EVERY DAY FOR BLOOD PRESSURE   aspirin  EC 81 MG tablet Take 162 mg by mouth daily.   atorvastatin  80 MG tablet Commonly known as: LIPITOR  TAKE 1 TABLET (80 MG TOTAL) BY MOUTH DAILY FOR CHOLESTEROL   busPIRone  5 MG tablet Commonly known as: BUSPAR  Take 1 tablet (5 mg total) by mouth 2 (two) times daily. For anxiety   cetirizine 10 MG tablet Commonly known as: ZYRTEC Take 10 mg by mouth daily as needed for allergies.   clopidogrel  75 MG tablet Commonly known as: PLAVIX  Take 1 tablet (75 mg total) by mouth daily.    cyanocobalamin  1000 MCG tablet Commonly known as: VITAMIN B12 Take 1,000 mcg by mouth daily.   diphenhydrAMINE  25 MG tablet Commonly known as: BENADRYL  Take 25 mg by mouth every 6 (six) hours as needed for allergies.   Eliquis  5 MG Tabs tablet Generic drug: apixaban  Take 1 tablet (5 mg total) by mouth 2 (two) times daily.   escitalopram  20 MG tablet Commonly known as: LEXAPRO  TAKE 1 TABLET BY MOUTH DAILY FOR ANXIETY   famotidine  20 MG tablet Commonly known as: PEPCID  Take 20 mg by mouth daily.   ferrous sulfate  325 (65 FE) MG tablet Take 325 mg by mouth daily with breakfast.   lisinopril  30 MG tablet Commonly known as: ZESTRIL  Take 1 tablet (30 mg total) by mouth daily. for blood pressure.   metoprolol  tartrate 25 MG tablet Commonly known as: LOPRESSOR  Take 1 tablet (25 mg total) by mouth 2 (two) times daily.   oxyCODONE -acetaminophen  5-325 MG tablet Commonly known as: PERCOCET/ROXICET Take 1 tablet by mouth every 6 (six) hours as needed for moderate pain (pain score 4-6).   pyridoxine 100 MG tablet Commonly  known as: B-6 Take 100 mg by mouth daily.   sodium chloride  1 g tablet TAKE 1 TABLET (1 G TOTAL) BY MOUTH 2 (TWO) TIMES DAILY WITH A MEAL. TO INCREASE SODIUM   vitamin C 1000 MG tablet Take 1,000 mg by mouth daily.   vitamin E 180 MG (400 UNITS) capsule Take 400 Units by mouth daily.   Wixela Inhub 250-50 MCG/ACT Aepb Generic drug: fluticasone -salmeterol INHALE 1 PUFF INTO THE LUNGS IN THE MORNING AND AT BEDTIME.         Discharge Instructions:   [facility] Discharge Instructions Carotid Endarterectomy (CEA)  Please refer to the following instructions for your post-procedure care. Your surgeon or physician assistant will discuss any changes with you.  Activity  You are encouraged to walk as much as you can. You can slowly return to normal activities but must avoid strenuous activity and heavy lifting until your  doctor tell you it's OK. Avoid activities such as vacuuming or swinging a golf club. You can drive after one week if you are comfortable and you are no longer taking prescription pain medications. It is normal to feel tired for serval weeks after your surgery. It is also normal to have difficulty with sleep habits, eating, and bowel movements after surgery. These will go away with time.  Bathing/Showering  You may shower after you come home. Do not soak in a bathtub, hot tub, or swim until the incision heals completely.  Incision Care  Shower every day. Clean your incision with mild soap and water. Pat the area dry with a clean towel. You do not need a bandage unless otherwise instructed. Do not apply any ointments or creams to your incision. You may have skin glue on your incision. Do not peel it off. It will come off on its own in about one week. Your incision may feel thickened and raised for several weeks after your surgery. This is normal and the skin will soften over time. For Men Only: It's OK to shave around the incision but do not shave the incision itself for 2 weeks. It is common to have numbness under your chin that could last for several months.  Diet  Resume your normal diet. There are no special food restrictions following this procedure. A low fat/low cholesterol diet is recommended for all patients with vascular disease. In order to heal from your surgery, it is CRITICAL to get adequate nutrition. Your body requires vitamins, minerals, and protein. Vegetables are the best source of vitamins and minerals. Vegetables also provide the perfect balance of protein. Processed food has little nutritional value, so try to avoid this.  Medications  Resume taking all of your medications unless your doctor or physician assistant tells you not to.  If your incision is causing pain, you may take over-the- counter pain relievers such as acetaminophen  (Tylenol ). If you were prescribed a stronger  pain medication, please be aware these medications can cause nausea and constipation.  Prevent nausea by taking the medication with a snack or meal. Avoid constipation by drinking plenty of fluids and eating foods with a high amount of fiber, such as fruits, vegetables, and grains. Do not take Tylenol  if you are taking prescription pain medications.  Follow Up  Our office will schedule a follow up appointment 2-3 weeks following discharge.  Please call us  immediately for any of the following conditions  Increased pain, redness, drainage (pus) from your incision site. Fever of 101 degrees or higher. If  you should develop stroke (slurred speech, difficulty swallowing, weakness on one side of your body, loss of vision) you should call 911 and go to the nearest emergency room.  Reduce your risk of vascular disease:  Stop smoking. If you would like help call [facility] at 1-800-QUIT-NOW ((347)216-3113) or [place] at [phone]. Manage your cholesterol Maintain a desired weight Control your diabetes Keep your blood pressure down  If you have any questions, please call the office at [phone].   Disposition: home  Patient's condition: is Good  Follow up: 1. [facility] in 4 weeks.   Donnice Sender, PA-C [facility] [phone]   --- For [facility] Registry use ---   Modified Rankin score at D/C (0-6): 0  IV medication needed for:  1. Hypertension: No 2. Hypotension: No  Post-op Complications: No  1. Post-op CVA or TIA: No  If yes: Event classification (right eye, left eye, right cortical, left cortical, verterobasilar, other):   If yes: Timing of event (intra-op, <6 hrs post-op, >=6 hrs post-op, unknown):   2. CN injury: No  If yes: CN  injuried   3. Myocardial infarction: No  If yes: Dx by (EKG or clinical, Troponin):   4.  CHF: No  5.  Dysrhythmia (new): Yes  6. Wound infection: No  7. Reperfusion symptoms: No  8. Return to OR: No  If  yes: return to OR for (bleeding, neurologic, other CEA incision, other):   Discharge medications: Statin use:  Yes ASA use:  Yes   Beta blocker use:  Yes ACE-Inhibitor use:  Yes  ARB use:  No CCB use: Yes P2Y12 Antagonist use: Yes, [ ]  Plavix , [ ]  Plasugrel, [ ]  Ticlopinine, [ ]  Ticagrelor, [ ]  Other, [ ]  No for medical reason, [ ]  Non-compliant, [ ]  Not-indicated Anti-coagulant use:  Yes, [ ]  Warfarin, [ ]  Rivaroxaban, [ ]  Dabigatran,

## 2024-02-16 NOTE — ED Triage Notes (Signed)
 Pt recently discharge from hospital admission after a cardiac procedure and pt started on another blood thinner. (Pt now on eliquis , plavix  and ASA). Pt came home yesterday and started to notice blood in her stools and more this morning. Denies abd pain.

## 2024-02-16 NOTE — Discharge Instructions (Signed)
 Thank you for letting us  evaluate you today.  Your hemoglobin is stable.  Your vital signs are within normal limits.  Please follow up with gastroenterology for further management  Return to ED if you experience worsening rectal bleeding, blood in stool, lightheadedness, dizziness, loss of consciousness, altered mentation, low blood pressure

## 2024-02-16 NOTE — ED Notes (Signed)
 Pt refusing EKG

## 2024-02-16 NOTE — ED Provider Notes (Signed)
 [facility] Provider Note   CSN: [id number] Arrival date & time: 02/16/24  1357     Patient presents with: Blood In Stools   Sondi Desch is a 63 y.o. female with past medical history of GIB 2021, HTN, former tobacco use, heart murmur, cardiomegaly, COPD, CAD, IDA (on iron supplementation), SIADH presents emergency department for evaluation of dark stools.  Reports that she noted dark stool with some bright red blood in toilet water today.  She reports that she normally has dark stools from iron pills that she takes for IDA.  Has no abdominal pain.  Of note, she recently had left TCAR by Dr. Lonni Gaskins.  Was recently placed on Plavix , Eliquis  by vascular surgery   HPI     Prior to Admission medications   Medication Sig Start Date End Date Taking? Authorizing Provider  acetaminophen  (TYLENOL ) 500 MG tablet Take 2 tablets (1,000 mg total) by mouth every 8 (eight) hours as needed. 10/31/23   Maczis, Michael M, PA-C  albuterol  (VENTOLIN  HFA) 108 (90 Base) MCG/ACT inhaler INHALE 2 PUFFS INTO THE LUNGS EVERY 4-6 HOURS AS NEEDED FOR TIGHTNESS/WHEEZING 01/31/24   Clark, Katherine K, NP  amLODipine  (NORVASC ) 10 MG tablet TAKE 1 TABLET BY MOUTH EVERY DAY FOR BLOOD PRESSURE 12/15/23   Clark, Katherine K, NP  apixaban  (ELIQUIS ) 5 MG TABS tablet Take 1 tablet (5 mg total) by mouth 2 (two) times daily. 02/14/24   Bethanie Cough, PA-C  Ascorbic Acid (VITAMIN C) 1000 MG tablet Take 1,000 mg by mouth daily.    [provider]  aspirin  EC 81 MG tablet Take 162 mg by mouth daily.    [provider]  atorvastatin  (LIPITOR ) 80 MG tablet TAKE 1 TABLET (80 MG TOTAL) BY MOUTH DAILY FOR CHOLESTEROL 01/15/24 01/14/25  Clark, Katherine K, NP  busPIRone  (BUSPAR ) 5 MG tablet Take 1 tablet (5 mg total) by mouth 2 (two) times daily. For anxiety 01/31/24   Clark, Katherine K, NP  cetirizine (ZYRTEC) 10 MG tablet Take 10 mg by mouth daily as needed  for allergies.    [provider]  clopidogrel  (PLAVIX ) 75 MG tablet Take 1 tablet (75 mg total) by mouth daily. 02/10/24   Gaskins Lonni PARAS, MD  diphenhydrAMINE  (BENADRYL ) 25 MG tablet Take 25 mg by mouth every 6 (six) hours as needed for allergies.    [provider]  escitalopram  (LEXAPRO ) 20 MG tablet TAKE 1 TABLET BY MOUTH DAILY FOR ANXIETY 12/15/23   Clark, Katherine K, NP  famotidine  (PEPCID ) 20 MG tablet Take 20 mg by mouth daily.    [provider]  ferrous sulfate  325 (65 FE) MG tablet Take 325 mg by mouth daily with breakfast.    [provider]  fluticasone -salmeterol (WIXELA INHUB) 250-50 MCG/ACT AEPB INHALE 1 PUFF INTO THE LUNGS IN THE MORNING AND AT BEDTIME. 08/18/23   Clark, Katherine K, NP  lisinopril  (ZESTRIL ) 30 MG tablet Take 1 tablet (30 mg total) by mouth daily. for blood pressure. 01/31/24   Clark, Katherine K, NP  metoprolol  tartrate (LOPRESSOR ) 25 MG tablet Take 1 tablet (25 mg total) by mouth 2 (two) times daily. 02/14/24   Bethanie Cough, PA-C  oxyCODONE -acetaminophen  (PERCOCET/ROXICET) 5-325 MG tablet Take 1 tablet by mouth every 6 (six) hours as needed for moderate pain (pain score 4-6). 02/14/24   Bethanie Cough, PA-C  pyridoxine (B-6) 100 MG tablet Take 100 mg by mouth daily.    [provider]  sodium chloride  1 g tablet TAKE 1 TABLET (1 G TOTAL) BY MOUTH 2 (TWO) TIMES DAILY WITH A MEAL. TO INCREASE SODIUM 01/07/24   Clark, Katherine K, NP  vitamin B-12 (CYANOCOBALAMIN ) 1000 MCG tablet Take 1,000 mcg by mouth daily.    [provider]  vitamin E 180 MG (400 UNITS) capsule Take 400 Units by mouth daily.    [provider]    Allergies: Patient has no known allergies.    Review of Systems  Gastrointestinal:  Positive for blood in stool.    Updated Vital Signs BP (!) 159/67   Pulse 72   Temp 98.2 F (36.8 C)   Resp 16   SpO2 95%   Physical Exam Vitals and nursing note reviewed.  Constitutional:       General: She is not in acute distress.    Appearance: Normal appearance.  HENT:     Head: Normocephalic and atraumatic.  Eyes:     Conjunctiva/sclera: Conjunctivae normal.  Cardiovascular:     Rate and Rhythm: Normal rate.  Pulmonary:     Effort: Pulmonary effort is normal. No respiratory distress.  Genitourinary:    Rectum: Guaiac result positive.     Comments: External non thrombosed nontender hemorrhoid. No gross external melena nor hematochezia. Dark stool retrieved with internal exam. No rectal pain. Skin:    Coloration: Skin is not jaundiced or pale.  Neurological:     Mental Status: She is alert and oriented to person, place, and time. Mental status is at baseline.   GU exam performed with Geofm Needle RN  (all labs ordered are listed, but only abnormal results are displayed) Labs Reviewed  CBC WITH DIFFERENTIAL/PLATELET - Abnormal; Notable for the following components:      Result Value   WBC 12.4 (*)    RDW 19.7 (*)    Neutro Abs 9.9 (*)    Abs Immature Granulocytes 0.08 (*)    All other components within normal limits  COMPREHENSIVE METABOLIC PANEL WITH GFR - Abnormal; Notable for the following components:   Sodium 130 (*)    Glucose, Bld 119 (*)    Total Protein 6.3 (*)    All other components within normal limits  POC OCCULT BLOOD, ED - Abnormal; Notable for the following components:   Fecal Occult Bld POSITIVE (*)    All other components within normal limits  MAGNESIUM     EKG: EKG Interpretation Date/Time:  Sunday February 16 2024 14:35:32 EDT Ventricular Rate:  108 PR Interval:    QRS Duration:  74 QT Interval:  346 QTC Calculation: 463 R Axis:   57  Text Interpretation: Narrow complex tachycardia with multiple PACs Cannot rule out Anterior infarct , age undetermined Abnormal ECG When compared with ECG of 13-Feb-2024 15:20, PREVIOUS ECG IS PRESENT Confirmed by Ula Barter [phone]) on 02/16/2024 7:06:40 PM  Radiology: No results  found.   Medications Ordered in the ED - No data to display                                  Medical Decision Making  Patient presents to the ED for concern of rectal bleeding, blood in stool, this involves an extensive number of treatment options, and is a complaint that carries with it a high risk of complications and morbidity.  The differential diagnosis includes GIB, hemorrhoid, thrombosed hemorrhoid, internal hemorrhoid, trauma   Co morbidities that complicate the patient evaluation  On 2 forms of anticoagulation/antiplatelet, history of GIB   Additional history obtained:  Additional history obtained from Nursing and Outside Medical Records   External records from outside source obtained and reviewed including triage note, endoscopy, colonoscopy results from 2021   Lab Tests:  I Ordered, and personally interpreted labs.  The pertinent results include:   Sodium 130 CBG 119 Fecal occult positive WBC 12.4 Hgb 13.8    Cardiac Monitoring:  The patient was maintained on a cardiac monitor.  I personally viewed and interpreted the cardiac monitored which showed an underlying rhythm of: NSR with multiple PACs      Problem List / ED Course:  Blood in stool Recently placed on Plavix , Eliquis  by vascular surgery for carotid artery stenosis, S/P TCAR Takes iron supplementation and normally has dark stool per patient History of GIB in 2021.  No longer follows with gastroenterology.  Normal endoscopy in 2021 Colonoscopy in 2021 found medium sized classic appearance AVM in cecum and treated with APC Patient reports that she is satisfied with knowing that her hemoglobin is normal.  She has no complaints of lightheadedness, dizziness.  She is hemodynamically stable. I recommended and emphasized importance of CTA, GI consult, admission. She does not wish to pursue any imaging, admission. I provided her with GI f/u as she is stable at this time and refusing additional workup  currently Her EKG shows tachycardia at 108bpm with PACs. I recommended getting another EKG, cardiology consult, troponin however she adamantly refuses. She reports she was diagnosed with a fib during last admission and needs to get home to take her medications. I offered to give her med here in ED but she refuses. Her HR decreased to 99bpm prior to DC. No complaints of CP, SHOB  Strict return precautions told to pt and provided on DC paperwork Hemorrhoid Nontender nor thrombosed Discussed stool softener, fiber supplements, adequate hydration   Reevaluation:  After the interventions noted above, I reevaluated the patient and found that they have :stayed the same   Social Determinants of Health:  Has pcp   Dispostion:  After consideration of the diagnostic results and the patients response to treatment, I feel that the patent would benefit from outpatient management with GI follow-up.   Discussed ED workup, disposition, return to ED precautions with patient who expresses understanding agrees with plan.  All questions answered to their satisfaction.  They are agreeable to plan.  Discharge instructions provided on paperwork  Final diagnoses:  Blood in stool    ED Discharge Orders     None          Minnie Tinnie BRAVO, PA 02/16/24 1912    Ula Prentice SAUNDERS, MD 02/17/24 0005

## 2024-02-16 NOTE — ED Provider Triage Note (Signed)
 Emergency Medicine Provider Triage Evaluation Note  Denise [name] , a 63 y.o. female  was evaluated in triage.  Pt complains of blood in her stools that she noted this morning.  Has had a couple episodes of this.  Is on Plavix , aspirin , and Eliquis .  Eliquis  was recently started during recent admission due to concern of A-fib.  Denies large-volume rectal bleeding.  Chronic melanotic stools from iron supplement.  Review of Systems  Positive: As above Negative: As above  Physical Exam  BP 134/78 (BP Location: Left Arm)   Pulse 100   Temp 98.2 F (36.8 C)   Resp 17   SpO2 93%  Gen:   Awake, no distress   Resp:  Normal effort  MSK:   Moves extremities without difficulty  Other:    Medical Decision Making  Medically screening exam initiated at 2:31 PM.  Appropriate orders placed.  Denise [name] was informed that the remainder of the evaluation will be completed by another provider, this initial triage assessment does not replace that evaluation, and the importance of remaining in the ED until their evaluation is complete.    Hildegard Loge, PA-C 02/16/24 1432

## 2024-02-17 ENCOUNTER — Ambulatory Visit: Payer: Self-pay | Admitting: Physician Assistant

## 2024-02-17 ENCOUNTER — Emergency Department (HOSPITAL_COMMUNITY)
Admission: EM | Admit: 2024-02-17 | Discharge: 2024-02-17 | Discharge status: Against Medical Advice | Source: Home / Self Care

## 2024-02-17 ENCOUNTER — Telehealth: Payer: Self-pay

## 2024-02-17 ENCOUNTER — Inpatient Hospital Stay (HOSPITAL_COMMUNITY)
Admission: EM | Admit: 2024-02-17 | Discharge: 2024-02-19 | DRG: 309 | Disposition: A | Discharge status: Home / Self Care | Attending: Internal Medicine | Admitting: Internal Medicine

## 2024-02-17 ENCOUNTER — Emergency Department (HOSPITAL_COMMUNITY)

## 2024-02-17 ENCOUNTER — Other Ambulatory Visit: Payer: Self-pay

## 2024-02-17 ENCOUNTER — Encounter (HOSPITAL_COMMUNITY): Payer: Self-pay

## 2024-02-17 DIAGNOSIS — Z5941 Food insecurity: Secondary | ICD-10-CM

## 2024-02-17 DIAGNOSIS — R000 Tachycardia, unspecified: Secondary | ICD-10-CM | POA: Diagnosis present

## 2024-02-17 DIAGNOSIS — Z5948 Other specified lack of adequate food: Secondary | ICD-10-CM

## 2024-02-17 DIAGNOSIS — K922 Gastrointestinal hemorrhage, unspecified: Secondary | ICD-10-CM | POA: Diagnosis present

## 2024-02-17 DIAGNOSIS — F32A Depression, unspecified: Secondary | ICD-10-CM | POA: Diagnosis present

## 2024-02-17 DIAGNOSIS — I6522 Occlusion and stenosis of left carotid artery: Secondary | ICD-10-CM | POA: Diagnosis present

## 2024-02-17 DIAGNOSIS — D509 Iron deficiency anemia, unspecified: Secondary | ICD-10-CM | POA: Diagnosis present

## 2024-02-17 DIAGNOSIS — Z5982 Transportation insecurity: Secondary | ICD-10-CM

## 2024-02-17 DIAGNOSIS — I10 Essential (primary) hypertension: Secondary | ICD-10-CM | POA: Insufficient documentation

## 2024-02-17 DIAGNOSIS — H9313 Tinnitus, bilateral: Secondary | ICD-10-CM | POA: Insufficient documentation

## 2024-02-17 DIAGNOSIS — Z604 Social exclusion and rejection: Secondary | ICD-10-CM | POA: Diagnosis present

## 2024-02-17 DIAGNOSIS — E785 Hyperlipidemia, unspecified: Secondary | ICD-10-CM | POA: Diagnosis present

## 2024-02-17 DIAGNOSIS — R911 Solitary pulmonary nodule: Secondary | ICD-10-CM | POA: Diagnosis present

## 2024-02-17 DIAGNOSIS — Z7902 Long term (current) use of antithrombotics/antiplatelets: Secondary | ICD-10-CM

## 2024-02-17 DIAGNOSIS — I352 Nonrheumatic aortic (valve) stenosis with insufficiency: Secondary | ICD-10-CM | POA: Diagnosis present

## 2024-02-17 DIAGNOSIS — I480 Paroxysmal atrial fibrillation: Secondary | ICD-10-CM | POA: Diagnosis present

## 2024-02-17 DIAGNOSIS — Z7982 Long term (current) use of aspirin: Secondary | ICD-10-CM

## 2024-02-17 DIAGNOSIS — I739 Peripheral vascular disease, unspecified: Secondary | ICD-10-CM | POA: Diagnosis present

## 2024-02-17 DIAGNOSIS — Z9841 Cataract extraction status, right eye: Secondary | ICD-10-CM

## 2024-02-17 DIAGNOSIS — Z5321 Procedure and treatment not carried out due to patient leaving prior to being seen by health care provider: Secondary | ICD-10-CM | POA: Insufficient documentation

## 2024-02-17 DIAGNOSIS — Z8701 Personal history of pneumonia (recurrent): Secondary | ICD-10-CM

## 2024-02-17 DIAGNOSIS — R061 Stridor: Secondary | ICD-10-CM | POA: Diagnosis present

## 2024-02-17 DIAGNOSIS — K5520 Angiodysplasia of colon without hemorrhage: Secondary | ICD-10-CM

## 2024-02-17 DIAGNOSIS — Z8249 Family history of ischemic heart disease and other diseases of the circulatory system: Secondary | ICD-10-CM

## 2024-02-17 DIAGNOSIS — F411 Generalized anxiety disorder: Secondary | ICD-10-CM | POA: Diagnosis present

## 2024-02-17 DIAGNOSIS — Z860100 Personal history of colon polyps, unspecified: Secondary | ICD-10-CM

## 2024-02-17 DIAGNOSIS — Z87891 Personal history of nicotine dependence: Secondary | ICD-10-CM

## 2024-02-17 DIAGNOSIS — Z8719 Personal history of other diseases of the digestive system: Secondary | ICD-10-CM

## 2024-02-17 DIAGNOSIS — I350 Nonrheumatic aortic (valve) stenosis: Secondary | ICD-10-CM

## 2024-02-17 DIAGNOSIS — Z8619 Personal history of other infectious and parasitic diseases: Secondary | ICD-10-CM

## 2024-02-17 DIAGNOSIS — I493 Ventricular premature depolarization: Secondary | ICD-10-CM | POA: Diagnosis present

## 2024-02-17 DIAGNOSIS — I4892 Unspecified atrial flutter: Principal | ICD-10-CM | POA: Diagnosis present

## 2024-02-17 DIAGNOSIS — K219 Gastro-esophageal reflux disease without esophagitis: Secondary | ICD-10-CM | POA: Diagnosis present

## 2024-02-17 DIAGNOSIS — M1990 Unspecified osteoarthritis, unspecified site: Secondary | ICD-10-CM | POA: Diagnosis present

## 2024-02-17 DIAGNOSIS — Z9181 History of falling: Secondary | ICD-10-CM

## 2024-02-17 DIAGNOSIS — I2510 Atherosclerotic heart disease of native coronary artery without angina pectoris: Secondary | ICD-10-CM | POA: Diagnosis present

## 2024-02-17 DIAGNOSIS — Z9842 Cataract extraction status, left eye: Secondary | ICD-10-CM

## 2024-02-17 DIAGNOSIS — Z7901 Long term (current) use of anticoagulants: Secondary | ICD-10-CM

## 2024-02-17 DIAGNOSIS — Z79899 Other long term (current) drug therapy: Secondary | ICD-10-CM

## 2024-02-17 DIAGNOSIS — J432 Centrilobular emphysema: Secondary | ICD-10-CM | POA: Diagnosis present

## 2024-02-17 DIAGNOSIS — Z9049 Acquired absence of other specified parts of digestive tract: Secondary | ICD-10-CM

## 2024-02-17 DIAGNOSIS — J449 Chronic obstructive pulmonary disease, unspecified: Secondary | ICD-10-CM | POA: Diagnosis present

## 2024-02-17 DIAGNOSIS — E222 Syndrome of inappropriate secretion of antidiuretic hormone: Secondary | ICD-10-CM | POA: Diagnosis present

## 2024-02-17 DIAGNOSIS — Z8261 Family history of arthritis: Secondary | ICD-10-CM

## 2024-02-17 DIAGNOSIS — I119 Hypertensive heart disease without heart failure: Secondary | ICD-10-CM | POA: Diagnosis present

## 2024-02-17 DIAGNOSIS — Z7951 Long term (current) use of inhaled steroids: Secondary | ICD-10-CM

## 2024-02-17 LAB — CBC WITH DIFFERENTIAL/PLATELET
Abs Immature Granulocytes: 0.09 K/uL — ABNORMAL HIGH (ref 0.00–0.07)
Abs Immature Granulocytes: 0.09 K/uL — ABNORMAL HIGH (ref 0.00–0.07)
Basophils Absolute: 0.1 K/uL (ref 0.0–0.1)
Basophils Absolute: 0.1 K/uL (ref 0.0–0.1)
Basophils Relative: 1 %
Basophils Relative: 1 %
Eosinophils Absolute: 0.2 K/uL (ref 0.0–0.5)
Eosinophils Absolute: 0.3 K/uL (ref 0.0–0.5)
Eosinophils Relative: 2 %
Eosinophils Relative: 2 %
HCT: 39.8 % (ref 36.0–46.0)
HCT: 39.1 % (ref 36.0–46.0)
Hemoglobin: 13.6 g/dL (ref 12.0–15.0)
Hemoglobin: 13.6 g/dL (ref 12.0–15.0)
Immature Granulocytes: 1 %
Immature Granulocytes: 1 %
Lymphocytes Relative: 12 %
Lymphocytes Relative: 17 %
Lymphs Abs: 1.5 K/uL (ref 0.7–4.0)
Lymphs Abs: 2.1 K/uL (ref 0.7–4.0)
MCH: 31.5 pg (ref 26.0–34.0)
MCH: 31.9 pg (ref 26.0–34.0)
MCHC: 34.2 g/dL (ref 30.0–36.0)
MCHC: 34.8 g/dL (ref 30.0–36.0)
MCV: 92.1 fL (ref 80.0–100.0)
MCV: 91.6 fL (ref 80.0–100.0)
Monocytes Absolute: 1 K/uL (ref 0.1–1.0)
Monocytes Absolute: 1.2 K/uL — ABNORMAL HIGH (ref 0.1–1.0)
Monocytes Relative: 8 %
Monocytes Relative: 10 %
Neutro Abs: 9.4 K/uL — ABNORMAL HIGH (ref 1.7–7.7)
Neutro Abs: 8.5 K/uL — ABNORMAL HIGH (ref 1.7–7.7)
Neutrophils Relative %: 76 %
Neutrophils Relative %: 69 %
Platelets: 343 K/uL (ref 150–400)
Platelets: 349 K/uL (ref 150–400)
RBC: 4.32 MIL/uL (ref 3.87–5.11)
RBC: 4.27 MIL/uL (ref 3.87–5.11)
RDW: 19.9 % — ABNORMAL HIGH (ref 11.5–15.5)
RDW: 19.9 % — ABNORMAL HIGH (ref 11.5–15.5)
WBC: 12.2 K/uL — ABNORMAL HIGH (ref 4.0–10.5)
WBC: 12.2 K/uL — ABNORMAL HIGH (ref 4.0–10.5)
nRBC: 0 % (ref 0.0–0.2)
nRBC: 0 % (ref 0.0–0.2)

## 2024-02-17 LAB — TROPONIN I (HIGH SENSITIVITY)
Troponin I (High Sensitivity): 7 ng/L (ref <18)
Troponin I (High Sensitivity): 7 ng/L (ref <18)

## 2024-02-17 LAB — BASIC METABOLIC PANEL WITH GFR
Anion gap: 10 (ref 5–15)
BUN: 5 mg/dL — ABNORMAL LOW (ref 8–23)
CO2: 22 mmol/L (ref 22–32)
Calcium: 9 mg/dL (ref 8.9–10.3)
Chloride: 99 mmol/L (ref 98–111)
Creatinine, Ser: 0.46 mg/dL (ref 0.44–1.00)
GFR, Estimated: >60 mL/min (ref >60)
Glucose, Bld: 104 mg/dL — ABNORMAL HIGH (ref 70–99)
Potassium: 3.7 mmol/L (ref 3.5–5.1)
Sodium: 131 mmol/L — ABNORMAL LOW (ref 135–145)

## 2024-02-17 LAB — COMPREHENSIVE METABOLIC PANEL WITH GFR
ALT: 17 U/L (ref 0–44)
AST: 26 U/L (ref 15–41)
Albumin: 3.5 g/dL (ref 3.5–5.0)
Alkaline Phosphatase: 76 U/L (ref 38–126)
Anion gap: 12 (ref 5–15)
BUN: 6 mg/dL — ABNORMAL LOW (ref 8–23)
CO2: 22 mmol/L (ref 22–32)
Calcium: 8.8 mg/dL — ABNORMAL LOW (ref 8.9–10.3)
Chloride: 94 mmol/L — ABNORMAL LOW (ref 98–111)
Creatinine, Ser: 0.55 mg/dL (ref 0.44–1.00)
GFR, Estimated: >60 mL/min (ref >60)
Glucose, Bld: 107 mg/dL — ABNORMAL HIGH (ref 70–99)
Potassium: 3.9 mmol/L (ref 3.5–5.1)
Sodium: 128 mmol/L — ABNORMAL LOW (ref 135–145)
Total Bilirubin: 0.9 mg/dL (ref 0.0–1.2)
Total Protein: 6.3 g/dL — ABNORMAL LOW (ref 6.5–8.1)

## 2024-02-17 MED ORDER — METOPROLOL TARTRATE 25 MG PO TABS
25.0000 mg | ORAL_TABLET | Freq: Two times a day (BID) | ORAL | Status: DC
  Administered 2024-02-17: 25 mg via ORAL
  Filled 2024-02-17: qty 1

## 2024-02-17 MED ORDER — DILTIAZEM LOAD VIA INFUSION
10.0000 mg | Freq: Once | INTRAVENOUS | Status: AC
Start: 2024-02-17 — End: 2024-02-18
  Administered 2024-02-18: 10 mg via INTRAVENOUS
  Filled 2024-02-17: qty 10

## 2024-02-17 MED ORDER — BUSPIRONE HCL 10 MG PO TABS
5.0000 mg | ORAL_TABLET | Freq: Two times a day (BID) | ORAL | Status: DC
  Administered 2024-02-17 – 2024-02-19 (×3): 5 mg via ORAL
  Filled 2024-02-17 (×3): qty 1

## 2024-02-17 MED ORDER — OXYCODONE-ACETAMINOPHEN 5-325 MG PO TABS
1.0000 | ORAL_TABLET | Freq: Four times a day (QID) | ORAL | Status: DC | PRN
  Administered 2024-02-17 – 2024-02-19 (×3): 1 via ORAL
  Filled 2024-02-17 (×3): qty 1

## 2024-02-17 MED ORDER — APIXABAN 5 MG PO TABS
5.0000 mg | ORAL_TABLET | Freq: Two times a day (BID) | ORAL | Status: DC
  Administered 2024-02-17: 5 mg via ORAL
  Filled 2024-02-17: qty 1

## 2024-02-17 MED ORDER — DILTIAZEM HCL-DEXTROSE 125-5 MG/125ML-% IV SOLN (PREMIX)
5.0000 mg/h | INTRAVENOUS | Status: DC
  Administered 2024-02-18: 5 mg/h via INTRAVENOUS
  Filled 2024-02-17: qty 125

## 2024-02-17 NOTE — Consult Note (Incomplete)
 Cardiology Consultation   Patient ID: Denise [name] MRN: [id number]; DOB: 11/13/1960  Admit date: 02/17/2024 Date of Consult: 02/17/2024  PCP:  Gretta Comer POUR, NP   [facility] Providers Cardiologist:  Shelda Bruckner, MD   { Click here to update MD or APP on Care Team, Refresh:1}     Patient Profile: Denise [name] is a 63 y.o. female with a hx of carotid artery stenosis, paroxysmal atrial fibrillation who is being seen 02/17/2024 for the evaluation of atrial fibrillation at the request of the Emergency Department.  History of Present Illness: Denise [name] ***  Patient recently underwent left transcarotid artery revascularization for asymptomatic carotid artery stenosis on 02/12/2024.  On postoperative day 1, she developed atrial fibrillation.  Neurology was consulted, she was started on apixaban  as well as aspirin  and Plavix .  She was also prescribed metoprolol  tartrate 25 mg twice daily.  She only followed up with cardiology in January/2025 for preoperative evaluation prior to carotid procedure.  Thoracic echocardiogram showed normal LV function, moderately dilated left atrial size, normal RV function,j moderate AS, .  Vitals include heart rate 155 bpm, atrial flutter, blood pressure 124-161/67-98, SpO2 100% on room air.  Labs include sodium 131, potassium 3.7, serum creatinine 0.46,, activity troponin 7->7, white blood cell count 12.2, platelet count 349k.  CT showed CAC score 659, severe mixed dense plaque in the proximal LAD (RADS score of 4), mild disease of the left circumflex and RCA,  Past Medical History:  Diagnosis Date   Allergy    Anemia 12/24/2019   Anxiety    Aortic insufficiency    Aortic stenosis    Arthritis    Carotid artery disease (HCC)    Centrilobular emphysema (HCC) 11/2023   Chickenpox    Cholecystitis with cholelithiasis 03/31/2012   Closed fracture of L4 transverse process s/p traumatic mechanical fall 10/26/2023   COPD  (chronic obstructive pulmonary disease) (HCC)    Coronary artery disease    Depression    Diastolic dysfunction    Dyspnea    with exertion   GERD (gastroesophageal reflux disease)    Headache    Heart murmur    High anion gap metabolic acidosis 10/26/2023   History of blood transfusion 2021   HLD (hyperlipidemia)    Hypertension 03/31/2012   Hyponatremia in setting of SIADH    Left lower lobe pulmonary nodule 11/2023   Long-term use of aspirin  therapy    Lumbar compression fracture s/p traumatic mechanical fall; L4 superior endplate    Pneumonia    x 2   PVD (peripheral vascular disease) (HCC)    Shingles    SIADH (syndrome of inappropriate ADH production) (HCC)    Traumatic mechanical fall 10/26/2023   a.) s/p traumatic fall 10/26/2023 reuslting in multiple RIGHT rib (5th - 7th) fractures (required dchest tube), l$ transverse process fracture, L4 superior endplate compression fracture, large volume post-traumatic myofacial/subcutaneous gas collection in chest    Past Surgical History:  Procedure Laterality Date   BIOPSY  12/24/2019   Procedure: BIOPSY;  Surgeon: Teressa Toribio SQUIBB, MD;  Location: [facility] ENDOSCOPY;  Service: Endoscopy;;   BRONCHIAL BIOPSY  12/19/2023   Procedure: BRONCHOSCOPY, WITH BIOPSY;  Surgeon: Isadora Hose, MD;  Location: [facility] ENDOSCOPY;  Service: Pulmonary;;   BRONCHIAL BRUSHINGS  12/19/2023   Procedure: BRONCHOSCOPY, WITH BRUSH BIOPSY;  Surgeon: Isadora Hose, MD;  Location: [facility] ENDOSCOPY;  Service: Pulmonary;;   BRONCHIAL NEEDLE ASPIRATION BIOPSY  12/19/2023   Procedure: BRONCHOSCOPY, WITH NEEDLE ASPIRATION BIOPSY;  Surgeon: Isadora Hose, MD;  Location[facility] ENDOSCOPY;  Service: Pulmonary;;   BRONCHIAL WASHINGS  12/19/2023   Procedure: GAYNELL MANI;  Surgeon: Isadora Hose, MD;  Location: [facility] ENDOSCOPY;  Service: Pulmonary;;   BRONCHOSCOPY, WITH BIOPSY USING ELECTROMAGNETIC NAVIGATION Bilateral 12/19/2023   Procedure: ROBOTIC ASSISTED NAVIGATIONAL  BRONCHOSCOPY;  Surgeon: Isadora Hose, MD;  Location: [facility] ENDOSCOPY;  Service: Pulmonary;  Laterality: Bilateral;   CATARACT EXTRACTION Bilateral 2021   CHOLECYSTECTOMY  03/30/2012   Procedure: LAPAROSCOPIC CHOLECYSTECTOMY WITH INTRAOPERATIVE CHOLANGIOGRAM;  Surgeon: Morene ONEIDA Olives, MD;  Location: [facility] ORS;  Service: General;  Laterality: N/A;   COLONOSCOPY WITH PROPOFOL  N/A 12/24/2019   Procedure: COLONOSCOPY WITH PROPOFOL ;  Surgeon: Teressa Toribio SQUIBB, MD;  Location[facility] ENDOSCOPY;  Service: Endoscopy;  Laterality: N/A;   ESOPHAGOGASTRODUODENOSCOPY (EGD) WITH PROPOFOL  N/A 12/24/2019   Procedure: ESOPHAGOGASTRODUODENOSCOPY (EGD) WITH PROPOFOL ;  Surgeon: Teressa Toribio SQUIBB, MD;  Location: [facility] ENDOSCOPY;  Service: Endoscopy;  Laterality: N/A;   HOT HEMOSTASIS N/A 12/24/2019   Procedure: HOT HEMOSTASIS (ARGON PLASMA COAGULATION/BICAP);  Surgeon: Teressa Toribio SQUIBB, MD;  Location: [facility] ENDOSCOPY;  Service: Endoscopy;  Laterality: N/A;   POLYPECTOMY  12/24/2019   Procedure: POLYPECTOMY;  Surgeon: Teressa Toribio SQUIBB, MD;  Location: [facility] ENDOSCOPY;  Service: Endoscopy;;   TRANSCAROTID ARTERY REVASCULARIZATION  Left 02/12/2024   Procedure: TRANSCAROTID ARTERY REVASCULARIZATION (TCAR);  Surgeon: Gretta Lonni PARAS, MD;  Location: [facility] OR;  Service: Vascular;  Laterality: Left;   TUBAL LIGATION  1997   VIDEO BRONCHOSCOPY WITH ENDOBRONCHIAL ULTRASOUND  12/19/2023   Procedure: BRONCHOSCOPY, WITH EBUS;  Surgeon: Isadora Hose, MD;  Location: [facility] ENDOSCOPY;  Service: Pulmonary;;   VIDEO BRONCHOSCOPY WITH RADIAL ENDOBRONCHIAL ULTRASOUND  12/19/2023   Procedure: VIDEO BRONCHOSCOPY WITH RADIAL ENDOBRONCHIAL ULTRASOUND;  Surgeon: Isadora Hose, MD;  Location: [facility] ENDOSCOPY;  Service: Pulmonary;;     Home Medications:  Prior to Admission medications   Medication Sig Start Date End Date Taking? Authorizing Provider  acetaminophen  (TYLENOL ) 500 MG tablet Take 2 tablets (1,000 mg total) by mouth every 8 (eight) hours as  needed. 10/31/23   Maczis, Michael M, PA-C  albuterol  (VENTOLIN  HFA) 108 (90 Base) MCG/ACT inhaler INHALE 2 PUFFS INTO THE LUNGS EVERY 4-6 HOURS AS NEEDED FOR TIGHTNESS/WHEEZING 01/31/24   Clark, Katherine K, NP  amLODipine  (NORVASC ) 10 MG tablet TAKE 1 TABLET BY MOUTH EVERY DAY FOR BLOOD PRESSURE 12/15/23   Gretta Comer POUR, NP  apixaban  (ELIQUIS ) 5 MG TABS tablet Take 1 tablet (5 mg total) by mouth 2 (two) times daily. 02/14/24   Bethanie Cough, PA-C  Ascorbic Acid (VITAMIN C) 1000 MG tablet Take 1,000 mg by mouth daily.    [provider]  aspirin  EC 81 MG tablet Take 162 mg by mouth daily.    [provider]  atorvastatin  (LIPITOR ) 80 MG tablet TAKE 1 TABLET (80 MG TOTAL) BY MOUTH DAILY FOR CHOLESTEROL 01/15/24 01/14/25  Clark, Katherine K, NP  busPIRone  (BUSPAR ) 5 MG tablet Take 1 tablet (5 mg total) by mouth 2 (two) times daily. For anxiety 01/31/24   Clark, Katherine K, NP  cetirizine (ZYRTEC) 10 MG tablet Take 10 mg by mouth daily as needed for allergies.    [provider]  clopidogrel  (PLAVIX ) 75 MG tablet Take 1 tablet (75 mg total) by mouth daily. 02/10/24   Gretta Lonni PARAS, MD  diphenhydrAMINE  (BENADRYL ) 25 MG tablet Take 25 mg by mouth every 6 (six) hours as needed for allergies.    [provider]  escitalopram  (LEXAPRO ) 20 MG tablet  TAKE 1 TABLET BY MOUTH DAILY FOR ANXIETY 12/15/23   Clark, Katherine K, NP  famotidine  (PEPCID ) 20 MG tablet Take 20 mg by mouth daily.    [provider]  ferrous sulfate  325 (65 FE) MG tablet Take 325 mg by mouth daily with breakfast.    [provider]  fluticasone -salmeterol (WIXELA INHUB) 250-50 MCG/ACT AEPB INHALE 1 PUFF INTO THE LUNGS IN THE MORNING AND AT BEDTIME. 08/18/23   Clark, Katherine K, NP  lisinopril  (ZESTRIL ) 30 MG tablet Take 1 tablet (30 mg total) by mouth daily. for blood pressure. 01/31/24   Clark, Katherine K, NP  metoprolol  tartrate (LOPRESSOR ) 25 MG tablet Take 1 tablet (25 mg  total) by mouth 2 (two) times daily. 02/14/24   Bethanie Cough, PA-C  oxyCODONE -acetaminophen  (PERCOCET/ROXICET) 5-325 MG tablet Take 1 tablet by mouth every 6 (six) hours as needed for moderate pain (pain score 4-6). 02/14/24   Bethanie Cough, PA-C  pyridoxine (B-6) 100 MG tablet Take 100 mg by mouth daily.    [provider]  sodium chloride  1 g tablet TAKE 1 TABLET (1 G TOTAL) BY MOUTH 2 (TWO) TIMES DAILY WITH A MEAL. TO INCREASE SODIUM 01/07/24   Clark, Katherine K, NP  vitamin B-12 (CYANOCOBALAMIN ) 1000 MCG tablet Take 1,000 mcg by mouth daily.    [provider]  vitamin E 180 MG (400 UNITS) capsule Take 400 Units by mouth daily.    [provider]    Scheduled Meds:  apixaban   5 mg Oral BID   busPIRone   5 mg Oral BID   diltiazem   10 mg Intravenous Once   metoprolol  tartrate  25 mg Oral BID   Continuous Infusions:  diltiazem  (CARDIZEM ) infusion     PRN Meds: oxyCODONE -acetaminophen   Allergies:   No Known Allergies  Social History:   Social History   Socioeconomic History   Marital status: Widowed    Spouse name: Not on file   Number of children: 2   Years of education: Not on file   Highest education level: 12th grade  Occupational History   Not on file  Tobacco Use   Smoking status: Former    Current packs/day: 0.00    Average packs/day: 0.8 packs/day for 38.0 years (28.5 ttl pk-yrs)    Types: Cigarettes    Start date: 05/06/1985    Quit date: 05/07/2023    Years since quitting: 0.7   Smokeless tobacco: Never  Vaping Use   Vaping status: Never Used  Substance and Sexual Activity   Alcohol use: Yes    Alcohol/week: 1.0 standard drink of alcohol    Types: 1 Standard drinks or equivalent per week    Comment: occasionally   Drug use: No   Sexual activity: Not Currently    Birth control/protection: Post-menopausal  Other Topics Concern   Not on file  Social History Narrative   Widow.    2 children.   Works in Clinical biochemist.    Enjoys reading, cooking.    Social Drivers of Health   Financial Resource Strain: Patient Declined (01/28/2024)   Overall Financial Resource Strain (CARDIA)    Difficulty of Paying Living Expenses: Patient declined  Food Insecurity: Food Insecurity Present (02/13/2024)   Hunger Vital Sign    Worried About Running Out of Food in the Last Year: Sometimes true    Ran Out of Food in the Last Year: Sometimes true  Transportation Needs: No Transportation Needs (02/13/2024)   PRAPARE - Transportation    Lack of  Transportation (Medical): No    Lack of Transportation (Non-Medical): No  Recent Concern: Transportation Needs - Unmet Transportation Needs (11/29/2023)   PRAPARE - Transportation    Lack of Transportation (Medical): No    Lack of Transportation (Non-Medical): Yes  Physical Activity: Insufficiently Active (01/28/2024)   Exercise Vital Sign    Days of Exercise per Week: 4 days    Minutes of Exercise per Session: 20 min  Stress: Stress Concern Present (01/28/2024)   [name] of Occupational Health - Occupational Stress Questionnaire    Feeling of Stress: Very much  Social Connections: Socially Isolated (01/28/2024)   Social Connection and Isolation Panel    Frequency of Communication with Friends and Family: Three times a week    Frequency of Social Gatherings with Friends and Family: Patient declined    Attends Religious Services: Patient declined    Active Member of Clubs or Organizations: No    Attends Engineer, structural: Not on file    Marital Status: Widowed  Catering manager Violence: Not on file    Family History:    Family History  Problem Relation Age of Onset   Arthritis Mother    Cervical cancer Mother    Heart disease Father    Hypertension Father      ROS:  Please see the history of present illness.   All other ROS reviewed and negative.     Physical Exam/Data: Vitals:   02/17/24 2141 02/17/24 2314  BP: (!) 161/67 (!) 124/98  Pulse: 60  (!) 155  Resp: 16   Temp: 97.9 F (36.6 C)   TempSrc: Oral   SpO2: 97%    No intake or output data in the 24 hours ending 02/17/24 2350    02/17/2024    3:29 PM 02/12/2024    6:32 AM 02/03/2024    8:54 AM  Last 3 Weights  Weight (lbs) 163 lb 163 lb 163 lb 12.8 oz  Weight (kg) 73.936 kg 73.936 kg 74.299 kg     There is no height or weight on file to calculate BMI.  General:  Well nourished, well developed, in no acute distress*** HEENT: normal Neck: no JVD Vascular: No carotid bruits; Distal pulses 2+ bilaterally Cardiac:  normal S1, S2; RRR; no murmur *** Lungs:  clear to auscultation bilaterally, no wheezing, rhonchi or rales  Abd: soft, nontender, no hepatomegaly  Ext: no edema Musculoskeletal:  No deformities, BUE and BLE strength normal and equal Skin: warm and dry  Neuro:  CNs 2-12 intact, no focal abnormalities noted Psych:  Normal affect   EKG:  The EKG was personally reviewed and demonstrates:  *** Telemetry:  Telemetry was personally reviewed and demonstrates:  ***  Relevant CV Studies:  IMPRESSIONS     1. Left ventricular ejection fraction, by estimation, is 70 to 75%. The  left ventricle has hyperdynamic function. The left ventricle has no  regional wall motion abnormalities. There is moderate concentric left  ventricular hypertrophy. Left ventricular  diastolic parameters are consistent with Grade I diastolic dysfunction  (impaired relaxation).   2. Right ventricular systolic function is normal. The right ventricular  size is normal.   3. Left atrial size was moderately dilated.   4. The mitral valve is normal in structure. Trivial mitral valve  regurgitation. No evidence of mitral stenosis.   5. The aortic valve is tricuspid. There is moderate calcification of the  aortic valve. Aortic valve regurgitation is mild. Moderate aortic valve  stenosis. Aortic regurgitation PHT measures  404 msec. Aortic valve area,  by VTI measures 1.21 cm. Aortic  valve mean  gradient measures 25.7 mmHg. Aortic valve Vmax measures 3.54  m/s.   6. The inferior vena cava is normal in size with greater than 50%  respiratory variability, suggesting right atrial pressure of 3 mmHg.   Laboratory Data: High Sensitivity Troponin:   Recent Labs  Lab 02/17/24 1544 02/17/24 1744  TROPONINIHS 7 7     Chemistry Recent Labs  Lab 02/12/24 1438 02/13/24 0315 02/14/24 0338 02/16/24 1429 02/17/24 1544 02/17/24 2156  NA 133* 130*   < > 130* 128* 131*  K 4.3 5.0   < > 4.1 3.9 3.7  CL 101 97*   < > 99 94* 99  CO2 21* 24   < > 22 22 22   GLUCOSE 132* 128*   < > 119* 107* 104*  BUN 8 8   < > 17 6* 5*  CREATININE 0.72 0.70   < > 0.72 0.55 0.46  CALCIUM  8.6* 9.0   < > 9.3 8.8* 9.0  MG 1.7 2.4  --  1.8  --   --   GFRNONAA >60 >60   < > >60 >60 >60  ANIONGAP 11 9   < > 9 12 10    < > = values in this interval not displayed.    Recent Labs  Lab 02/16/24 1429 02/17/24 1544  PROT 6.3* 6.3*  ALBUMIN  3.5 3.5  AST 23 26  ALT 19 17  ALKPHOS 78 76  BILITOT 0.7 0.9   Lipids  Recent Labs  Lab 02/13/24 0315  CHOL 137  TRIG 70  HDL 56  LDLCALC 67  CHOLHDL 2.4    Hematology Recent Labs  Lab 02/16/24 1429 02/17/24 1544 02/17/24 2156  WBC 12.4* 12.2* 12.2*  RBC 4.42 4.32 4.27  HGB 13.8 13.6 13.6  HCT 41.3 39.8 39.1  MCV 93.4 92.1 91.6  MCH 31.2 31.5 31.9  MCHC 33.4 34.2 34.8  RDW 19.7* 19.9* 19.9*  PLT 328 343 349   Thyroid  Recent Labs  Lab 02/12/24 1438 02/13/24 0315  TSH 0.340*  --   FREET4  --  1.01    BNPNo results for input(s): BNP, PROBNP in the last 168 hours.  DDimer No results for input(s): DDIMER in the last 168 hours.  Radiology/Studies:  No results found.   Assessment and Plan: ***   Risk Assessment/Risk Scores: {Complete the following score calculators/questions to meet required metrics.  Press F2         :[id number]}   {Is the patient being seen for unstable angina, ACS, NSTEMI or STEMI?:[id number]} {Does this patient  have CHF or CHF symptoms?      :[id number]}  CHA2DS2-VASc Score =    {Click here to calculate score.  REFRESH note before signing. :1} This indicates a  % annual risk of stroke. The patient's score is based upon:       {Are we signing off today?:[id number]}  For questions or updates, please contact [facility] Please consult [email or web address] for contact info under    Signed, Shigeru Lampert A Akeya Ryther, MD  02/17/2024 11:50 PM

## 2024-02-17 NOTE — ED Provider Notes (Signed)
 [place] EMERGENCY DEPARTMENT AT [facility] Provider Note   CSN: [id number] Arrival date & time: 02/17/24  2126     Patient presents with: Hypertension   Denise [name] is a 63 y.o. female presents today for hypertension after a recent left carotid stent placement on Wednesday.  Patient reports compliance with medication.  Patient reports some discomfort at the stent site but no pain.  Patient reports some overall pain at this time and says that she is due for her Percocet and metoprolol .    Hypertension       Prior to Admission medications   Medication Sig Start Date End Date Taking? Authorizing Provider  acetaminophen  (TYLENOL ) 500 MG tablet Take 2 tablets (1,000 mg total) by mouth every 8 (eight) hours as needed. 10/31/23   Maczis, Michael M, PA-C  albuterol  (VENTOLIN  HFA) 108 (90 Base) MCG/ACT inhaler INHALE 2 PUFFS INTO THE LUNGS EVERY 4-6 HOURS AS NEEDED FOR TIGHTNESS/WHEEZING 01/31/24   Gretta Comer POUR, NP  amLODipine  (NORVASC ) 10 MG tablet TAKE 1 TABLET BY MOUTH EVERY DAY FOR BLOOD PRESSURE 12/15/23   Gretta Comer POUR, NP  apixaban  (ELIQUIS ) 5 MG TABS tablet Take 1 tablet (5 mg total) by mouth 2 (two) times daily. 02/14/24   Bethanie Cough, PA-C  Ascorbic Acid (VITAMIN C) 1000 MG tablet Take 1,000 mg by mouth daily.    [provider]  aspirin  EC 81 MG tablet Take 162 mg by mouth daily.    [provider]  atorvastatin  (LIPITOR ) 80 MG tablet TAKE 1 TABLET (80 MG TOTAL) BY MOUTH DAILY FOR CHOLESTEROL 01/15/24 01/14/25  Clark, Katherine K, NP  busPIRone  (BUSPAR ) 5 MG tablet Take 1 tablet (5 mg total) by mouth 2 (two) times daily. For anxiety 01/31/24   Clark, Katherine K, NP  cetirizine (ZYRTEC) 10 MG tablet Take 10 mg by mouth daily as needed for allergies.    [provider]  clopidogrel  (PLAVIX ) 75 MG tablet Take 1 tablet (75 mg total) by mouth daily. 02/10/24   Gretta Lonni PARAS, MD  diphenhydrAMINE  (BENADRYL ) 25 MG tablet Take  25 mg by mouth every 6 (six) hours as needed for allergies.    [provider]  escitalopram  (LEXAPRO ) 20 MG tablet TAKE 1 TABLET BY MOUTH DAILY FOR ANXIETY 12/15/23   Clark, Katherine K, NP  famotidine  (PEPCID ) 20 MG tablet Take 20 mg by mouth daily.    [provider]  ferrous sulfate  325 (65 FE) MG tablet Take 325 mg by mouth daily with breakfast.    [provider]  fluticasone -salmeterol (WIXELA INHUB) 250-50 MCG/ACT AEPB INHALE 1 PUFF INTO THE LUNGS IN THE MORNING AND AT BEDTIME. 08/18/23   Clark, Katherine K, NP  lisinopril  (ZESTRIL ) 30 MG tablet Take 1 tablet (30 mg total) by mouth daily. for blood pressure. 01/31/24   Clark, Katherine K, NP  metoprolol  tartrate (LOPRESSOR ) 25 MG tablet Take 1 tablet (25 mg total) by mouth 2 (two) times daily. 02/14/24   Bethanie Cough, PA-C  oxyCODONE -acetaminophen  (PERCOCET/ROXICET) 5-325 MG tablet Take 1 tablet by mouth every 6 (six) hours as needed for moderate pain (pain score 4-6). 02/14/24   Bethanie Cough, PA-C  pyridoxine (B-6) 100 MG tablet Take 100 mg by mouth daily.    [provider]  sodium chloride  1 g tablet TAKE 1 TABLET (1 G TOTAL) BY MOUTH 2 (TWO) TIMES DAILY WITH A MEAL. TO INCREASE SODIUM 01/07/24   Clark, Katherine K, NP  vitamin B-12 (CYANOCOBALAMIN ) 1000 MCG tablet  Take 1,000 mcg by mouth daily.    [provider]  vitamin E 180 MG (400 UNITS) capsule Take 400 Units by mouth daily.    [provider]    Allergies: Patient has no known allergies.    Review of Systems  Updated Vital Signs BP 105/86   Pulse (!) 51   Temp 97.9 F (36.6 C) (Oral)   Resp 20   SpO2 97%   Physical Exam Vitals and nursing note reviewed.  Constitutional:      General: She is not in acute distress.    Appearance: Normal appearance. She is well-developed. She is not ill-appearing.  HENT:     Head: Normocephalic and atraumatic.     Right Ear: External ear normal.     Left Ear: External ear normal.      Nose: Nose normal.  Eyes:     Conjunctiva/sclera: Conjunctivae normal.  Cardiovascular:     Rate and Rhythm: Normal rate and regular rhythm.     Pulses: Normal pulses.     Heart sounds: Normal heart sounds. No murmur heard.    Comments: Patient appears to intermittently be going into A-fib/flutter Pulmonary:     Effort: Pulmonary effort is normal. No respiratory distress.     Breath sounds: Normal breath sounds.     Comments: Patient has mild wheezing on exam, per patient she has had this since her surgery and is actually improving.  Patient takes albuterol  as needed Abdominal:     Palpations: Abdomen is soft.     Tenderness: There is no abdominal tenderness.  Musculoskeletal:        General: No swelling.     Cervical back: Neck supple.  Skin:    General: Skin is warm and dry.     Capillary Refill: Capillary refill takes less than 2 seconds.     Comments: Well-healing incision site to left neck/chest, surrounding ecchymosis likely secondary to procedure and blood thinner use.  Neurological:     General: No focal deficit present.     Mental Status: She is alert and oriented to person, place, and time.  Psychiatric:        Mood and Affect: Mood normal.     (all labs ordered are listed, but only abnormal results are displayed) Labs Reviewed  CBC WITH DIFFERENTIAL/PLATELET - Abnormal; Notable for the following components:      Result Value   WBC 12.2 (*)    RDW 19.9 (*)    Neutro Abs 8.5 (*)    Monocytes Absolute 1.2 (*)    Abs Immature Granulocytes 0.09 (*)    All other components within normal limits  BASIC METABOLIC PANEL WITH GFR - Abnormal; Notable for the following components:   Sodium 131 (*)    Glucose, Bld 104 (*)    BUN 5 (*)    All other components within normal limits  BRAIN NATRIURETIC PEPTIDE  PROTIME-INR    EKG: EKG Interpretation Date/Time:  Monday February 17 2024 23:13:58 EDT Ventricular Rate:  154 PR Interval:  95 QRS Duration:  170 QT  Interval:  367 QTC Calculation: 588 R Axis:   41  Text Interpretation: Sinus or ectopic atrial tachycardia Right bundle branch block LVH with secondary repolarization abnormality Artifact in lead(s) I II III aVR aVL Rate faster Confirmed by Carita [name] [phone]) on 02/17/2024 11:25:51 PM  Radiology: CT Head Wo Contrast Result Date: 02/17/2024 CLINICAL DATA:  Headache, new onset, hypertension EXAM: CT HEAD WITHOUT CONTRAST TECHNIQUE: Contiguous axial images  were obtained from the base of the skull through the vertex without intravenous contrast. RADIATION DOSE REDUCTION: This exam was performed according to the departmental dose-optimization program which includes automated exposure control, adjustment of the mA and/or kV according to patient size and/or use of iterative reconstruction technique. COMPARISON:  CT head 10/26/2023 FINDINGS: Brain: No intracranial hemorrhage, mass effect, or evidence of acute infarct. No hydrocephalus. No extra-axial fluid collection. Vascular: No hyperdense vessel or unexpected calcification. Skull: No fracture or focal lesion. Sinuses/Orbits: No acute finding. Other: None. IMPRESSION: No acute intracranial abnormality. Electronically Signed   By: Norman Gatlin M.D.   On: 02/17/2024 23:58     Procedures   Medications Ordered in the ED  metoprolol  tartrate (LOPRESSOR ) tablet 25 mg (25 mg Oral Given 02/17/24 2314)  oxyCODONE -acetaminophen  (PERCOCET/ROXICET) 5-325 MG per tablet 1 tablet (1 tablet Oral Given 02/17/24 2312)  busPIRone  (BUSPAR ) tablet 5 mg (5 mg Oral Given 02/17/24 2331)  apixaban  (ELIQUIS ) tablet 5 mg (5 mg Oral Given 02/17/24 2332)  diltiazem  (CARDIZEM ) 1 mg/mL load via infusion 10 mg (10 mg Intravenous Bolus from Bag 02/18/24 0009)    And  diltiazem  (CARDIZEM ) 125 mg in dextrose  5% 125 mL (1 mg/mL) infusion (5 mg/hr Intravenous New Bag/Given 02/18/24 0009)                                    Medical Decision Making Amount and/or Complexity of Data  Reviewed Labs: ordered.   This patient presents to the ED for concern of hypertension differential diagnosis includes hypertension, hypertensive emergency, postop complication   Additional history obtained   Additional history obtained from Electronic Medical Record External records from outside source obtained and reviewed including admission notes   Lab Tests:  I Ordered, and personally interpreted labs.  The pertinent results include: Patient mildly hyponatremic at 131 which is around her baseline, decreased bun at 5, leukocytosis at 12.2 without left shift, troponin 7, 7 EKG with sinus rhythm and multiform PVCs CT head Noncon which showed no acute intracranial abnormality  Medicines ordered and prescription drug management:  I ordered medication including Percocet, metoprolol , BuSpar , Eliquis , Cardizem  loading and infusion    I have reviewed the patients home medicines and have made adjustments as needed   Problem List / ED Course:  Consult Cardiology Dr. Gail who is agreeable to round on the patient while admitted.  They had no other recommendations for labs or imaging at this time. Consult Hospitalist, Dr. Shona who is agreeable to admission and asking that gastroenterology be consulted Consult gastroenterology, Dr. Stacia who was agreeable to see the patient while inpatient.        Final diagnoses:  Atrial flutter, unspecified type [facility])    ED Discharge Orders     None          Denise Hettinger N, PA-C 02/18/24 Denise Carita Senior, MD 02/18/24 [phone]

## 2024-02-17 NOTE — Transitions of Care (Post Inpatient/ED Visit) (Signed)
   02/17/2024  Name: Denise [name] MRN: [id number] DOB: September 05, 1960  Today's TOC FU Call Status: Today's TOC FU Call Status:: Unsuccessful Call (1st Attempt) Unsuccessful Call (1st Attempt) Date: 02/17/24  Attempted to reach the patient regarding the most recent Inpatient/ED visit.  Follow Up Plan: Additional outreach attempts will be made to reach the patient to complete the Transitions of Care (Post Inpatient/ED visit) call.   Arvin Seip RN, BSN, CCM [employer], Population Health Case Manager Phone: [phone]

## 2024-02-17 NOTE — ED Triage Notes (Signed)
 Patient arrives via [facility] for hypertension with recent stent placement. Initial BP 220/110, fire repeated result of same. Ems BP 160/80, left carotid stent on Wednesday back home Friday, complaint with meds. Radial felt irregular. HR now 90s. SR with multifocal PVC's. No pain but discomfort at stent site.   Alert oriented.   20 LAC.

## 2024-02-17 NOTE — ED Provider Triage Note (Signed)
 Emergency Medicine Provider Triage Evaluation Note  Denise [name] , a 63 y.o. female  was evaluated in triage.  Pt complains of having a sensation that comes over her around noon today with some ringing in her ears and feeling generally unwell.  She took her blood pressure and it was elevated at 220/100.  She recently had stent placement in the left carotid and had to stay an extra day due to atrial fibrillation.  She has been compliant with her blood pressure medication but today when this happened she took an extra 5 mg of amlodipine  and 10 mg of lisinopril .  She reports now she is feeling better  Review of Systems  Positive: Ringing in her ears and general feeling of being unwell Negative: Shortness of breath or chest pain  Physical Exam  BP 131/75 (BP Location: Left Arm)   Pulse 89   Temp 98.2 F (36.8 C)   Resp 16   Ht 5' 3 (1.6 m)   Wt 73.9 kg   SpO2 93%   BMI 28.87 kg/m  Gen:   Awake, no distress   Resp:  Normal effort with minimal stridor and wheezing MSK:   Moves extremities without difficulty  Other:  Surgical scar in the left neck area with surrounding ecchymosis but no hematomas and appears to be healing well  Medical Decision Making  Medically screening exam initiated at 3:43 PM.  Appropriate orders placed.  Denise [name] was informed that the remainder of the evaluation will be completed by another provider, this initial triage assessment does not replace that evaluation, and the importance of remaining in the ED until their evaluation is complete.  Patient with symptoms concerning for possible hypertensive urgency today.  She took an additional lisinopril  and amlodipine  with improvement now blood pressure at 131/75.  She is mentating normally.  She does have mild stridor which seems to be related to her recent surgery and she reports it has been present since surgery nothing worse.   Denise Folks, MD 02/17/24 1544

## 2024-02-17 NOTE — ED Triage Notes (Signed)
  Patient arrives via [facility] for hypertension with recent stent placement. Initial BP 220/110, fire repeated result of same. Ems BP 160/80, left carotid stent on Wednesday back home Friday, complaint with meds. Radial felt irregular. HR now 90s. SR with multifocal PVC's. No pain but discomfort at stent site.    Alert oriented.    20 LAC.

## 2024-02-17 NOTE — ED Provider Notes (Incomplete)
 [place] EMERGENCY DEPARTMENT AT [facility] Provider Note   CSN: [id number] Arrival date & time: 02/17/24  2126     Patient presents with: Hypertension   Denise [name] is a 63 y.o. female presents today for hypertension after a recent left carotid stent placement on Wednesday.  Patient reports compliance with medication.  Patient reports some discomfort at the stent site but no pain.  Patient reports some overall pain at this time and says that she is due for her Percocet and metoprolol .  {Add pertinent medical, surgical, social history, OB history to HPI:32947}  Hypertension       Prior to Admission medications   Medication Sig Start Date End Date Taking? Authorizing Provider  acetaminophen  (TYLENOL ) 500 MG tablet Take 2 tablets (1,000 mg total) by mouth every 8 (eight) hours as needed. 10/31/23   Maczis, Michael M, PA-C  albuterol  (VENTOLIN  HFA) 108 (90 Base) MCG/ACT inhaler INHALE 2 PUFFS INTO THE LUNGS EVERY 4-6 HOURS AS NEEDED FOR TIGHTNESS/WHEEZING 01/31/24   Gretta Comer POUR, NP  amLODipine  (NORVASC ) 10 MG tablet TAKE 1 TABLET BY MOUTH EVERY DAY FOR BLOOD PRESSURE 12/15/23   Gretta Comer POUR, NP  apixaban  (ELIQUIS ) 5 MG TABS tablet Take 1 tablet (5 mg total) by mouth 2 (two) times daily. 02/14/24   Bethanie Cough, PA-C  Ascorbic Acid (VITAMIN C) 1000 MG tablet Take 1,000 mg by mouth daily.    [provider]  aspirin  EC 81 MG tablet Take 162 mg by mouth daily.    [provider]  atorvastatin  (LIPITOR ) 80 MG tablet TAKE 1 TABLET (80 MG TOTAL) BY MOUTH DAILY FOR CHOLESTEROL 01/15/24 01/14/25  Clark, Katherine K, NP  busPIRone  (BUSPAR ) 5 MG tablet Take 1 tablet (5 mg total) by mouth 2 (two) times daily. For anxiety 01/31/24   Clark, Katherine K, NP  cetirizine (ZYRTEC) 10 MG tablet Take 10 mg by mouth daily as needed for allergies.    [provider]  clopidogrel  (PLAVIX ) 75 MG tablet Take 1 tablet (75 mg total) by mouth daily. 02/10/24    Gretta Lonni PARAS, MD  diphenhydrAMINE  (BENADRYL ) 25 MG tablet Take 25 mg by mouth every 6 (six) hours as needed for allergies.    [provider]  escitalopram  (LEXAPRO ) 20 MG tablet TAKE 1 TABLET BY MOUTH DAILY FOR ANXIETY 12/15/23   Clark, Katherine K, NP  famotidine  (PEPCID ) 20 MG tablet Take 20 mg by mouth daily.    [provider]  ferrous sulfate  325 (65 FE) MG tablet Take 325 mg by mouth daily with breakfast.    [provider]  fluticasone -salmeterol (WIXELA INHUB) 250-50 MCG/ACT AEPB INHALE 1 PUFF INTO THE LUNGS IN THE MORNING AND AT BEDTIME. 08/18/23   Clark, Katherine K, NP  lisinopril  (ZESTRIL ) 30 MG tablet Take 1 tablet (30 mg total) by mouth daily. for blood pressure. 01/31/24   Clark, Katherine K, NP  metoprolol  tartrate (LOPRESSOR ) 25 MG tablet Take 1 tablet (25 mg total) by mouth 2 (two) times daily. 02/14/24   Bethanie Cough, PA-C  oxyCODONE -acetaminophen  (PERCOCET/ROXICET) 5-325 MG tablet Take 1 tablet by mouth every 6 (six) hours as needed for moderate pain (pain score 4-6). 02/14/24   Bethanie Cough, PA-C  pyridoxine (B-6) 100 MG tablet Take 100 mg by mouth daily.    [provider]  sodium chloride  1 g tablet TAKE 1 TABLET (1 G TOTAL) BY MOUTH 2 (TWO) TIMES DAILY WITH A MEAL. TO INCREASE SODIUM 01/07/24   Gretta Comer  K, NP  vitamin B-12 (CYANOCOBALAMIN ) 1000 MCG tablet Take 1,000 mcg by mouth daily.    [provider]  vitamin E 180 MG (400 UNITS) capsule Take 400 Units by mouth daily.    [provider]    Allergies: Patient has no known allergies.    Review of Systems  Updated Vital Signs BP (!) 161/67 (BP Location: Left Arm)   Pulse 60   Temp 97.9 F (36.6 C) (Oral)   Resp 16   SpO2 97%   Physical Exam Vitals and nursing note reviewed.  Constitutional:      General: She is not in acute distress.    Appearance: Normal appearance. She is well-developed. She is not ill-appearing.  HENT:     Head:  Normocephalic and atraumatic.     Right Ear: External ear normal.     Left Ear: External ear normal.     Nose: Nose normal.  Eyes:     Conjunctiva/sclera: Conjunctivae normal.  Cardiovascular:     Rate and Rhythm: Normal rate and regular rhythm.     Pulses: Normal pulses.     Heart sounds: Normal heart sounds. No murmur heard.    Comments: Patient appears to intermittently be going into A-fib/flutter Pulmonary:     Effort: Pulmonary effort is normal. No respiratory distress.     Breath sounds: Normal breath sounds.     Comments: Patient has mild wheezing on exam, per patient she has had this since her surgery and is actually improving.  Patient takes albuterol  as needed Abdominal:     Palpations: Abdomen is soft.     Tenderness: There is no abdominal tenderness.  Musculoskeletal:        General: No swelling.     Cervical back: Neck supple.  Skin:    General: Skin is warm and dry.     Capillary Refill: Capillary refill takes less than 2 seconds.     Comments: Well-healing incision site to left neck/chest, surrounding ecchymosis likely secondary to procedure and blood thinner use.  Neurological:     General: No focal deficit present.     Mental Status: She is alert and oriented to person, place, and time.  Psychiatric:        Mood and Affect: Mood normal.     (all labs ordered are listed, but only abnormal results are displayed) Labs Reviewed  CBC WITH DIFFERENTIAL/PLATELET - Abnormal; Notable for the following components:      Result Value   WBC 12.2 (*)    RDW 19.9 (*)    Neutro Abs 8.5 (*)    Monocytes Absolute 1.2 (*)    Abs Immature Granulocytes 0.09 (*)    All other components within normal limits  BASIC METABOLIC PANEL WITH GFR    EKG: EKG Interpretation Date/Time:  Monday February 17 2024 22:00:38 EDT Ventricular Rate:  96 PR Interval:  147 QRS Duration:  81 QT Interval:  363 QTC Calculation: 459 R Axis:   45  Text Interpretation: Sinus tachycardia Multiform  ventricular premature complexes Anterior infarct, old Confirmed by Ruthe Cornet [phone]) on 02/17/2024 10:01:48 PM  Radiology: No results found.  {Document cardiac monitor, telemetry assessment procedure when appropriate:32947} Procedures   Medications Ordered in the ED - No data to display    {Click here for ABCD2, HEART and other calculators REFRESH Note before signing:1}  Medical Decision Making Amount and/or Complexity of Data Reviewed Labs: ordered.   This patient presents to the ED for concern of hypertension differential diagnosis includes hypertension, hypertensive emergency, postop complication   Additional history obtained   Additional history obtained from Electronic Medical Record External records from outside source obtained and reviewed including admission notes   Lab Tests:  I Ordered, and personally interpreted labs.  The pertinent results include: Patient mildly hyponatremic at 131 which is around her baseline, decreased bun at 5, leukocytosis at 12.2 without left shift, troponin 7, 7 EKG with sinus rhythm and multiform PVCs CT head Noncon which showed no acute intracranial abnormality  Medicines ordered and prescription drug management:  I ordered medication including Percocet, metoprolol , BuSpar , Eliquis , Cardizem  loading and infusion    I have reviewed the patients home medicines and have made adjustments as needed   Problem List / ED Course:  Consult Cardiology Dr. Gail who is agreeable to round on the patient while admitted.  They had no other recommendations for labs or imaging at this time. Consult Hospitalist, Dr.      {Document critical care time when appropriate  Document review of labs and clinical decision tools ie [id number], etc  Document your independent review of radiology images and any outside records  Document your discussion with family members, caretakers and with consultants  Document social  determinants of health affecting pt's care  Document your decision making why or why not admission, treatments were needed:32947:::1}   Final diagnoses:  None    ED Discharge Orders     None

## 2024-02-18 ENCOUNTER — Inpatient Hospital Stay (HOSPITAL_COMMUNITY)

## 2024-02-18 ENCOUNTER — Telehealth: Payer: Self-pay

## 2024-02-18 DIAGNOSIS — I4892 Unspecified atrial flutter: Secondary | ICD-10-CM | POA: Diagnosis present

## 2024-02-18 DIAGNOSIS — K922 Gastrointestinal hemorrhage, unspecified: Secondary | ICD-10-CM | POA: Diagnosis present

## 2024-02-18 DIAGNOSIS — I6521 Occlusion and stenosis of right carotid artery: Secondary | ICD-10-CM | POA: Diagnosis not present

## 2024-02-18 DIAGNOSIS — I4891 Unspecified atrial fibrillation: Secondary | ICD-10-CM | POA: Diagnosis not present

## 2024-02-18 DIAGNOSIS — Z5941 Food insecurity: Secondary | ICD-10-CM | POA: Diagnosis not present

## 2024-02-18 DIAGNOSIS — R061 Stridor: Secondary | ICD-10-CM | POA: Diagnosis present

## 2024-02-18 DIAGNOSIS — I739 Peripheral vascular disease, unspecified: Secondary | ICD-10-CM | POA: Diagnosis present

## 2024-02-18 DIAGNOSIS — K5520 Angiodysplasia of colon without hemorrhage: Secondary | ICD-10-CM | POA: Diagnosis not present

## 2024-02-18 DIAGNOSIS — Z7901 Long term (current) use of anticoagulants: Secondary | ICD-10-CM | POA: Diagnosis not present

## 2024-02-18 DIAGNOSIS — Z79899 Other long term (current) drug therapy: Secondary | ICD-10-CM | POA: Diagnosis not present

## 2024-02-18 DIAGNOSIS — I480 Paroxysmal atrial fibrillation: Secondary | ICD-10-CM | POA: Diagnosis present

## 2024-02-18 DIAGNOSIS — D509 Iron deficiency anemia, unspecified: Secondary | ICD-10-CM | POA: Diagnosis present

## 2024-02-18 DIAGNOSIS — I10 Essential (primary) hypertension: Secondary | ICD-10-CM | POA: Diagnosis not present

## 2024-02-18 DIAGNOSIS — I352 Nonrheumatic aortic (valve) stenosis with insufficiency: Secondary | ICD-10-CM | POA: Diagnosis present

## 2024-02-18 DIAGNOSIS — I119 Hypertensive heart disease without heart failure: Secondary | ICD-10-CM | POA: Diagnosis present

## 2024-02-18 DIAGNOSIS — I493 Ventricular premature depolarization: Secondary | ICD-10-CM | POA: Diagnosis present

## 2024-02-18 DIAGNOSIS — E785 Hyperlipidemia, unspecified: Secondary | ICD-10-CM | POA: Diagnosis present

## 2024-02-18 DIAGNOSIS — Z5982 Transportation insecurity: Secondary | ICD-10-CM | POA: Diagnosis not present

## 2024-02-18 DIAGNOSIS — K625 Hemorrhage of anus and rectum: Secondary | ICD-10-CM | POA: Diagnosis not present

## 2024-02-18 DIAGNOSIS — F411 Generalized anxiety disorder: Secondary | ICD-10-CM | POA: Diagnosis present

## 2024-02-18 DIAGNOSIS — Z860100 Personal history of colon polyps, unspecified: Secondary | ICD-10-CM | POA: Diagnosis not present

## 2024-02-18 DIAGNOSIS — I2510 Atherosclerotic heart disease of native coronary artery without angina pectoris: Secondary | ICD-10-CM | POA: Diagnosis present

## 2024-02-18 DIAGNOSIS — I6522 Occlusion and stenosis of left carotid artery: Secondary | ICD-10-CM | POA: Diagnosis not present

## 2024-02-18 DIAGNOSIS — Z604 Social exclusion and rejection: Secondary | ICD-10-CM | POA: Diagnosis present

## 2024-02-18 DIAGNOSIS — R000 Tachycardia, unspecified: Secondary | ICD-10-CM | POA: Diagnosis present

## 2024-02-18 DIAGNOSIS — Z87891 Personal history of nicotine dependence: Secondary | ICD-10-CM | POA: Diagnosis not present

## 2024-02-18 DIAGNOSIS — F32A Depression, unspecified: Secondary | ICD-10-CM | POA: Diagnosis present

## 2024-02-18 DIAGNOSIS — E222 Syndrome of inappropriate secretion of antidiuretic hormone: Secondary | ICD-10-CM | POA: Diagnosis present

## 2024-02-18 DIAGNOSIS — Z8249 Family history of ischemic heart disease and other diseases of the circulatory system: Secondary | ICD-10-CM | POA: Diagnosis not present

## 2024-02-18 DIAGNOSIS — Z8719 Personal history of other diseases of the digestive system: Secondary | ICD-10-CM

## 2024-02-18 DIAGNOSIS — J432 Centrilobular emphysema: Secondary | ICD-10-CM | POA: Diagnosis present

## 2024-02-18 LAB — BASIC METABOLIC PANEL WITH GFR
Anion gap: 9 (ref 5–15)
BUN: 5 mg/dL — ABNORMAL LOW (ref 8–23)
CO2: 25 mmol/L (ref 22–32)
Calcium: 9.1 mg/dL (ref 8.9–10.3)
Chloride: 99 mmol/L (ref 98–111)
Creatinine, Ser: 0.56 mg/dL (ref 0.44–1.00)
GFR, Estimated: >60 mL/min (ref >60)
Glucose, Bld: 106 mg/dL — ABNORMAL HIGH (ref 70–99)
Potassium: 3.7 mmol/L (ref 3.5–5.1)
Sodium: 133 mmol/L — ABNORMAL LOW (ref 135–145)

## 2024-02-18 LAB — CBC
HCT: 39.6 % (ref 36.0–46.0)
Hemoglobin: 13.1 g/dL (ref 12.0–15.0)
MCH: 31.3 pg (ref 26.0–34.0)
MCHC: 33.1 g/dL (ref 30.0–36.0)
MCV: 94.7 fL (ref 80.0–100.0)
Platelets: 332 K/uL (ref 150–400)
RBC: 4.18 MIL/uL (ref 3.87–5.11)
RDW: 20 % — ABNORMAL HIGH (ref 11.5–15.5)
WBC: 11.7 K/uL — ABNORMAL HIGH (ref 4.0–10.5)
nRBC: 0 % (ref 0.0–0.2)

## 2024-02-18 LAB — ECHOCARDIOGRAM LIMITED
AR max vel: 0.98 cm2
AV Area VTI: 0.94 cm2
AV Area mean vel: 0.94 cm2
AV Mean grad: 34.5 mmHg
AV Peak grad: 58.2 mmHg
Ao pk vel: 3.82 m/s
Calc EF: 63.5 %
Height: 63 in
Single Plane A2C EF: 65.7 %
Single Plane A4C EF: 63.1 %
Weight: 2607.99 [oz_av]

## 2024-02-18 LAB — MAGNESIUM: Magnesium: 2 mg/dL (ref 1.7–2.4)

## 2024-02-18 LAB — PROTIME-INR
INR: 1 (ref 0.8–1.2)
Prothrombin Time: 14 s (ref 11.4–15.2)

## 2024-02-18 LAB — HIV ANTIBODY (ROUTINE TESTING W REFLEX): HIV Screen 4th Generation wRfx: NONREACTIVE

## 2024-02-18 LAB — BRAIN NATRIURETIC PEPTIDE: B Natriuretic Peptide: 256.1 pg/mL — ABNORMAL HIGH (ref 0.0–100.0)

## 2024-02-18 LAB — PHOSPHORUS: Phosphorus: 3.3 mg/dL (ref 2.5–4.6)

## 2024-02-18 MED ORDER — PROCHLORPERAZINE EDISYLATE 10 MG/2ML IJ SOLN: 5.0000 mg | Freq: Four times a day (QID) | INTRAMUSCULAR | Status: DC | PRN

## 2024-02-18 MED ORDER — NYSTATIN 100000 UNIT/ML MT SUSP
5.0000 mL | Freq: Four times a day (QID) | OROMUCOSAL | Status: DC
  Administered 2024-02-18: 500000 [IU] via ORAL
  Filled 2024-02-18 (×2): qty 5

## 2024-02-18 MED ORDER — POLYETHYLENE GLYCOL 3350 17 G PO PACK: 17.0000 g | PACK | Freq: Every day | ORAL | Status: DC | PRN

## 2024-02-18 MED ORDER — VITAMIN B-12 1000 MCG PO TABS
1000.0000 ug | ORAL_TABLET | Freq: Every day | ORAL | Status: DC
  Administered 2024-02-18 – 2024-02-19 (×2): 1000 ug via ORAL
  Filled 2024-02-18 (×2): qty 1

## 2024-02-18 MED ORDER — LORAZEPAM 2 MG/ML IJ SOLN
0.5000 mg | Freq: Four times a day (QID) | INTRAMUSCULAR | Status: DC | PRN
  Administered 2024-02-18: 0.5 mg via INTRAVENOUS
  Filled 2024-02-18: qty 1

## 2024-02-18 MED ORDER — FERROUS SULFATE 325 (65 FE) MG PO TABS
650.0000 mg | ORAL_TABLET | Freq: Every day | ORAL | Status: DC
  Administered 2024-02-19: 650 mg via ORAL
  Filled 2024-02-18: qty 2

## 2024-02-18 MED ORDER — MAGIC MOUTHWASH: 15.0000 mL | Freq: Four times a day (QID) | ORAL | Status: DC | PRN

## 2024-02-18 MED ORDER — ESCITALOPRAM OXALATE 10 MG PO TABS
20.0000 mg | ORAL_TABLET | Freq: Every day | ORAL | Status: DC
  Administered 2024-02-18 – 2024-02-19 (×2): 20 mg via ORAL
  Filled 2024-02-18 (×2): qty 2

## 2024-02-18 MED ORDER — FAMOTIDINE 20 MG PO TABS
20.0000 mg | ORAL_TABLET | Freq: Every day | ORAL | Status: DC
  Administered 2024-02-18 – 2024-02-19 (×2): 20 mg via ORAL
  Filled 2024-02-18 (×2): qty 1

## 2024-02-18 MED ORDER — METOPROLOL TARTRATE 25 MG PO TABS
37.5000 mg | ORAL_TABLET | Freq: Two times a day (BID) | ORAL | Status: DC
  Administered 2024-02-18 (×2): 37.5 mg via ORAL
  Filled 2024-02-18 (×2): qty 2

## 2024-02-18 MED ORDER — MELATONIN 5 MG PO TABS: 5.0000 mg | ORAL_TABLET | Freq: Every evening | ORAL | Status: DC | PRN

## 2024-02-18 MED ORDER — ACETAMINOPHEN 325 MG PO TABS: 650.0000 mg | ORAL_TABLET | Freq: Four times a day (QID) | ORAL | Status: DC | PRN

## 2024-02-18 MED ORDER — POTASSIUM CHLORIDE 20 MEQ PO PACK
40.0000 meq | PACK | Freq: Once | ORAL | Status: AC
  Administered 2024-02-18: 40 meq via ORAL
  Filled 2024-02-18: qty 2

## 2024-02-18 MED ORDER — PANTOPRAZOLE SODIUM 40 MG IV SOLR
40.0000 mg | Freq: Two times a day (BID) | INTRAVENOUS | Status: DC
  Administered 2024-02-18 – 2024-02-19 (×3): 40 mg via INTRAVENOUS
  Filled 2024-02-18 (×3): qty 10

## 2024-02-18 MED ORDER — AMIODARONE HCL IN DEXTROSE 360-4.14 MG/200ML-% IV SOLN
60.0000 mg/h | INTRAVENOUS | Status: AC
  Administered 2024-02-18 (×2): 60 mg/h via INTRAVENOUS
  Filled 2024-02-18: qty 200

## 2024-02-18 MED ORDER — ATORVASTATIN CALCIUM 80 MG PO TABS
80.0000 mg | ORAL_TABLET | Freq: Every day | ORAL | Status: DC
Start: 2024-02-18 — End: 2024-02-19
  Administered 2024-02-18 – 2024-02-19 (×2): 80 mg via ORAL
  Filled 2024-02-18: qty 1
  Filled 2024-02-18: qty 2

## 2024-02-18 MED ORDER — AMIODARONE HCL IN DEXTROSE 360-4.14 MG/200ML-% IV SOLN
30.0000 mg/h | INTRAVENOUS | Status: DC
  Administered 2024-02-18: 30 mg/h via INTRAVENOUS
  Filled 2024-02-18 (×2): qty 200

## 2024-02-18 MED ORDER — LEVALBUTEROL HCL 0.63 MG/3ML IN NEBU
0.6300 mg | INHALATION_SOLUTION | Freq: Three times a day (TID) | RESPIRATORY_TRACT | Status: DC
  Administered 2024-02-18 (×2): 0.63 mg via RESPIRATORY_TRACT
  Filled 2024-02-18 (×2): qty 3

## 2024-02-18 MED ORDER — AMIODARONE LOAD VIA INFUSION
150.0000 mg | Freq: Once | INTRAVENOUS | Status: AC
  Administered 2024-02-18: 150 mg via INTRAVENOUS
  Filled 2024-02-18: qty 83.34

## 2024-02-18 MED ORDER — CLOPIDOGREL BISULFATE 75 MG PO TABS
75.0000 mg | ORAL_TABLET | Freq: Every day | ORAL | Status: DC
  Administered 2024-02-18 – 2024-02-19 (×2): 75 mg via ORAL
  Filled 2024-02-18 (×2): qty 1

## 2024-02-18 NOTE — Consult Note (Addendum)
 Consultation  Referring Provider: TRH/ Tobie  Primary Care Physician:  Gretta Comer POUR, NP Primary Gastroenterologist:  Dr.Jacobs- former 2021  Reason for Consultation:  rectal bleeding in setting of ASA/Plavix , and Eliquis   HPI: Denise [name] is a 63 y.o. female with history of carotid artery stenosis, atrial fibrillation, COPD, aortic stenosis, coronary artery disease who is status post cholecystectomy and has previous history of iron deficiency anemia. Patient underwent left transcarotid artery revascularization for carotid artery stenosis on 02/12/2024.  She was started on Plavix  and aspirin .  Apparently the day after surgery she developed atrial fibrillation, was seen in consultation by cardiology and had Eliquis  added to her regimen.  She was also started on metoprolol .  She was discharged home.  She says that she was not feeling well yesterday in general, and had taken her blood pressure and it was noted to be in the 180s systolic.  She came to the emergency room late last evening and was found to be in A-fib/flutter with RVR.  She was started on Cardizem  drip.SABRA  Her last dose of Eliquis  was last evening. In the background of this she says that she had not had a bowel movement for couple of days postoperatively, then after she went home on Saturday had noted a loose dark bowel movement.  On Sunday she took a picture of her bowel movement which appeared dark blackish in the bed and surrounded with red tinge to the water.  Yesterday she had 1 bowel movement which she describes as a more formed to very dark stool which also in the water was surrounded by a reddish tent.  She has not had a bowel movement today. She does have history of GERD, uses Pepcid  as needed, she says she has been gassier since surgery and has been burping a lot but had not had any other GI symptoms.  No nausea or vomiting, no dysphagia or dyne aphasia, no abdominal pain.  Labs in the ER with WBC 12.2/hemoglobin  13.6/hematocrit 39.8/MCV 92/sodium 128/BUN 6/creatinine 0.55 Repeat hemoglobin very stable at 13.6 BNP 256 INR 1.0 Potassium 3.7  She has been evaluated by cardiology who feels okay to leave her off Eliquis  for now.  She remains in A-fib intermittently but rate more controlled. Also seen by vascular surgery who prefers her to stay on Plavix  and aspirin  as she is only 1 week out from surgery.  Patient did have prior EGD and colonoscopy done in May 2021 per Dr. Teressa for iron deficiency anemia.  EGD was normal at that time and at colonoscopy she had a 7 mm polyp removed from the ascending colon and was noted to have a single AVM in the cecum which was treated with APC. Path on the polyp consistent with a tubular adenoma, was indicated for 10-year interval follow-up at that time  She says she has stayed on an iron supplement orally.  No recent NSAID use.   Past Medical History:  Diagnosis Date   Allergy    Anemia 12/24/2019   Anxiety    Aortic insufficiency    Aortic stenosis    Arthritis    Carotid artery disease (HCC)    Centrilobular emphysema (HCC) 11/2023   Chickenpox    Cholecystitis with cholelithiasis 03/31/2012   Closed fracture of L4 transverse process s/p traumatic mechanical fall 10/26/2023   COPD (chronic obstructive pulmonary disease) (HCC)    Coronary artery disease    Depression    Diastolic dysfunction    Dyspnea  with exertion   GERD (gastroesophageal reflux disease)    Headache    Heart murmur    High anion gap metabolic acidosis 10/26/2023   History of blood transfusion 2021   HLD (hyperlipidemia)    Hypertension 03/31/2012   Hyponatremia in setting of SIADH    Left lower lobe pulmonary nodule 11/2023   Long-term use of aspirin  therapy    Lumbar compression fracture s/p traumatic mechanical fall; L4 superior endplate    Pneumonia    x 2   PVD (peripheral vascular disease) (HCC)    Shingles    SIADH (syndrome of inappropriate ADH production) (HCC)     Traumatic mechanical fall 10/26/2023   a.) s/p traumatic fall 10/26/2023 reuslting in multiple RIGHT rib (5th - 7th) fractures (required dchest tube), l$ transverse process fracture, L4 superior endplate compression fracture, large volume post-traumatic myofacial/subcutaneous gas collection in chest    Past Surgical History:  Procedure Laterality Date   BIOPSY  12/24/2019   Procedure: BIOPSY;  Surgeon: Teressa Toribio SQUIBB, MD;  Location: [facility] ENDOSCOPY;  Service: Endoscopy;;   BRONCHIAL BIOPSY  12/19/2023   Procedure: BRONCHOSCOPY, WITH BIOPSY;  Surgeon: Isadora Hose, MD;  Location: [facility] ENDOSCOPY;  Service: Pulmonary;;   BRONCHIAL BRUSHINGS  12/19/2023   Procedure: BRONCHOSCOPY, WITH BRUSH BIOPSY;  Surgeon: Isadora Hose, MD;  Location: [facility] ENDOSCOPY;  Service: Pulmonary;;   BRONCHIAL NEEDLE ASPIRATION BIOPSY  12/19/2023   Procedure: BRONCHOSCOPY, WITH NEEDLE ASPIRATION BIOPSY;  Surgeon: Isadora Hose, MD;  Location: [facility] ENDOSCOPY;  Service: Pulmonary;;   BRONCHIAL WASHINGS  12/19/2023   Procedure: IRRIGATION, BRONCHUS;  Surgeon: Isadora Hose, MD;  Location: [facility] ENDOSCOPY;  Service: Pulmonary;;   BRONCHOSCOPY, WITH BIOPSY USING ELECTROMAGNETIC NAVIGATION Bilateral 12/19/2023   Procedure: ROBOTIC ASSISTED NAVIGATIONAL BRONCHOSCOPY;  Surgeon: Isadora Hose, MD;  Location: [facility] ENDOSCOPY;  Service: Pulmonary;  Laterality: Bilateral;   CATARACT EXTRACTION Bilateral 2021   CHOLECYSTECTOMY  03/30/2012   Procedure: LAPAROSCOPIC CHOLECYSTECTOMY WITH INTRAOPERATIVE CHOLANGIOGRAM;  Surgeon: Morene ONEIDA Olives, MD;  Location: [facility] ORS;  Service: General;  Laterality: N/A;   COLONOSCOPY WITH PROPOFOL  N/A 12/24/2019   Procedure: COLONOSCOPY WITH PROPOFOL ;  Surgeon: Teressa Toribio SQUIBB, MD;  Location: [facility] ENDOSCOPY;  Service: Endoscopy;  Laterality: N/A;   ESOPHAGOGASTRODUODENOSCOPY (EGD) WITH PROPOFOL  N/A 12/24/2019   Procedure: ESOPHAGOGASTRODUODENOSCOPY (EGD) WITH PROPOFOL ;  Surgeon: Teressa Toribio SQUIBB, MD;   Location: [facility] ENDOSCOPY;  Service: Endoscopy;  Laterality: N/A;   HOT HEMOSTASIS N/A 12/24/2019   Procedure: HOT HEMOSTASIS (ARGON PLASMA COAGULATION/BICAP);  Surgeon: Teressa Toribio SQUIBB, MD;  Location: [facility] ENDOSCOPY;  Service: Endoscopy;  Laterality: N/A;   POLYPECTOMY  12/24/2019   Procedure: POLYPECTOMY;  Surgeon: Teressa Toribio SQUIBB, MD;  Location: [facility] ENDOSCOPY;  Service: Endoscopy;;   TRANSCAROTID ARTERY REVASCULARIZATION  Left 02/12/2024   Procedure: TRANSCAROTID ARTERY REVASCULARIZATION (TCAR);  Surgeon: Gretta Lonni PARAS, MD;  Location: [facility] OR;  Service: Vascular;  Laterality: Left;   TUBAL LIGATION  1997   VIDEO BRONCHOSCOPY WITH ENDOBRONCHIAL ULTRASOUND  12/19/2023   Procedure: BRONCHOSCOPY, WITH EBUS;  Surgeon: Isadora Hose, MD;  Location: [facility] ENDOSCOPY;  Service: Pulmonary;;   VIDEO BRONCHOSCOPY WITH RADIAL ENDOBRONCHIAL ULTRASOUND  12/19/2023   Procedure: VIDEO BRONCHOSCOPY WITH RADIAL ENDOBRONCHIAL ULTRASOUND;  Surgeon: Isadora Hose, MD;  Location: [facility] ENDOSCOPY;  Service: Pulmonary;;    Prior to Admission medications   Medication Sig Start Date End Date Taking? Authorizing Provider  acetaminophen  (TYLENOL ) 500 MG tablet Take 2 tablets (1,000 mg total) by mouth every 8 (eight) hours as needed. Patient taking differently:  Take 1,000 mg by mouth daily as needed for mild pain (pain score 1-3) or moderate pain (pain score 4-6). 10/31/23  Yes Maczis, Michael M, PA-C  albuterol  (VENTOLIN  HFA) 108 (90 Base) MCG/ACT inhaler INHALE 2 PUFFS INTO THE LUNGS EVERY 4-6 HOURS AS NEEDED FOR TIGHTNESS/WHEEZING Patient taking differently: Inhale 1-2 puffs into the lungs daily as needed for wheezing or shortness of breath. 01/31/24  Yes Clark, Katherine K, NP  amLODipine  (NORVASC ) 10 MG tablet TAKE 1 TABLET BY MOUTH EVERY DAY FOR BLOOD PRESSURE 12/15/23  Yes Gretta Comer POUR, NP  apixaban  (ELIQUIS ) 5 MG TABS tablet Take 1 tablet (5 mg total) by mouth 2 (two) times daily. 02/14/24  Yes Bethanie Cough, PA-C   Ascorbic Acid (VITAMIN C) 1000 MG tablet Take 2,000 mg by mouth daily.   Yes [provider]  aspirin  EC 81 MG tablet Take 81 mg by mouth daily.   Yes [provider]  atorvastatin  (LIPITOR ) 80 MG tablet TAKE 1 TABLET (80 MG TOTAL) BY MOUTH DAILY FOR CHOLESTEROL 01/15/24 01/14/25 Yes Clark, Katherine K, NP  busPIRone  (BUSPAR ) 5 MG tablet Take 1 tablet (5 mg total) by mouth 2 (two) times daily. For anxiety 01/31/24  Yes Gretta Comer POUR, NP  clopidogrel  (PLAVIX ) 75 MG tablet Take 1 tablet (75 mg total) by mouth daily. 02/10/24  Yes Gretta Lonni PARAS, MD  escitalopram  (LEXAPRO ) 20 MG tablet TAKE 1 TABLET BY MOUTH DAILY FOR ANXIETY 12/15/23  Yes Gretta Comer POUR, NP  famotidine  (PEPCID ) 20 MG tablet Take 20 mg by mouth daily.   Yes [provider]  ferrous sulfate  325 (65 FE) MG tablet Take 650 mg by mouth daily with breakfast.   Yes [provider]  fluticasone -salmeterol (WIXELA INHUB) 250-50 MCG/ACT AEPB INHALE 1 PUFF INTO THE LUNGS IN THE MORNING AND AT BEDTIME. Patient taking differently: Inhale 1 puff into the lungs 2 (two) times daily. 08/18/23  Yes Gretta Comer POUR, NP  lisinopril  (ZESTRIL ) 30 MG tablet Take 1 tablet (30 mg total) by mouth daily. for blood pressure. 01/31/24  Yes Clark, Katherine K, NP  metoprolol  tartrate (LOPRESSOR ) 25 MG tablet Take 1 tablet (25 mg total) by mouth 2 (two) times daily. 02/14/24  Yes Bethanie Cough, PA-C  oxyCODONE -acetaminophen  (PERCOCET/ROXICET) 5-325 MG tablet Take 1 tablet by mouth every 6 (six) hours as needed for moderate pain (pain score 4-6). Patient taking differently: Take 1 tablet by mouth daily as needed for moderate pain (pain score 4-6). 02/14/24  Yes Bethanie Cough, PA-C  pyridoxine (B-6) 100 MG tablet Take 100 mg by mouth daily.   Yes [provider]  sodium chloride  1 g tablet TAKE 1 TABLET (1 G TOTAL) BY MOUTH 2 (TWO) TIMES DAILY WITH A MEAL. TO INCREASE SODIUM Patient taking differently: Take  1 g by mouth 2 (two) times daily with a meal. 01/07/24  Yes Gretta Comer POUR, NP  vitamin B-12 (CYANOCOBALAMIN ) 1000 MCG tablet Take 1,000 mcg by mouth daily.   Yes [provider]  vitamin E 180 MG (400 UNITS) capsule Take 400 Units by mouth daily.   Yes [provider]    Current Facility-Administered Medications  Medication Dose Route Frequency Provider Last Rate Last Admin   acetaminophen  (TYLENOL ) tablet 650 mg  650 mg Oral Q6H PRN Shona Laurence N, DO       amiodarone  (NEXTERONE ) 1.8 mg/mL load via infusion 150 mg  150 mg Intravenous Once Raford Riggs, MD       Followed by  amiodarone  (NEXTERONE  PREMIX) 360-4.14 MG/200ML-% (1.8 mg/mL) IV infusion  60 mg/hr Intravenous Continuous Raford Riggs, MD       Followed by   amiodarone  (NEXTERONE  PREMIX) 360-4.14 MG/200ML-% (1.8 mg/mL) IV infusion  30 mg/hr Intravenous Continuous Raford Riggs, MD       atorvastatin  (LIPITOR ) tablet 80 mg  80 mg Oral Daily Patel, Pranav M, MD       busPIRone  (BUSPAR ) tablet 5 mg  5 mg Oral BID Keith, Kayla N, PA-C   5 mg at 02/17/24 2331   clopidogrel  (PLAVIX ) tablet 75 mg  75 mg Oral Daily Patel, Pranav M, MD       cyanocobalamin  (VITAMIN B12) tablet 1,000 mcg  1,000 mcg Oral Daily Tobie Yetta HERO, MD       escitalopram  (LEXAPRO ) tablet 20 mg  20 mg Oral Daily Patel, Pranav M, MD       famotidine  (PEPCID ) tablet 20 mg  20 mg Oral Daily Patel, Pranav M, MD       [START ON 02/19/2024] ferrous sulfate  tablet 650 mg  650 mg Oral Q breakfast Patel, Pranav M, MD       levalbuterol  (XOPENEX ) nebulizer solution 0.63 mg  0.63 mg Nebulization Q8H Patel, Pranav M, MD       magic mouthwash  15 mL Oral QID PRN Patel, Pranav M, MD       metoprolol  tartrate (LOPRESSOR ) tablet 37.5 mg  37.5 mg Oral BID Patel, Pranav M, MD       nystatin  (MYCOSTATIN ) 100000 UNIT/ML suspension 500,000 Units  5 mL Oral QID Patel, Pranav M, MD       oxyCODONE -acetaminophen  (PERCOCET/ROXICET) 5-325 MG per tablet 1  tablet  1 tablet Oral Q6H PRN Keith, Kayla N, PA-C   1 tablet at 02/17/24 2312   pantoprazole  (PROTONIX ) injection 40 mg  40 mg Intravenous BID Hall, Carole N, DO   40 mg at 02/18/24 0253   polyethylene glycol (MIRALAX  / GLYCOLAX ) packet 17 g  17 g Oral Daily PRN Shona Terry SAILOR, DO       potassium chloride  (KLOR-CON ) packet 40 mEq  40 mEq Oral Once Patel, Pranav M, MD       prochlorperazine  (COMPAZINE ) injection 5 mg  5 mg Intravenous Q6H PRN Shona Terry SAILOR, DO       Current Outpatient Medications  Medication Sig Dispense Refill   acetaminophen  (TYLENOL ) 500 MG tablet Take 2 tablets (1,000 mg total) by mouth every 8 (eight) hours as needed. (Patient taking differently: Take 1,000 mg by mouth daily as needed for mild pain (pain score 1-3) or moderate pain (pain score 4-6).)     albuterol  (VENTOLIN  HFA) 108 (90 Base) MCG/ACT inhaler INHALE 2 PUFFS INTO THE LUNGS EVERY 4-6 HOURS AS NEEDED FOR TIGHTNESS/WHEEZING (Patient taking differently: Inhale 1-2 puffs into the lungs daily as needed for wheezing or shortness of breath.) 8.5 each 0   amLODipine  (NORVASC ) 10 MG tablet TAKE 1 TABLET BY MOUTH EVERY DAY FOR BLOOD PRESSURE 90 tablet 0   apixaban  (ELIQUIS ) 5 MG TABS tablet Take 1 tablet (5 mg total) by mouth 2 (two) times daily. 60 tablet 1   Ascorbic Acid (VITAMIN C) 1000 MG tablet Take 2,000 mg by mouth daily.     aspirin  EC 81 MG tablet Take 81 mg by mouth daily.     atorvastatin  (LIPITOR ) 80 MG tablet TAKE 1 TABLET (80 MG TOTAL) BY MOUTH DAILY FOR CHOLESTEROL 90 tablet 0   busPIRone  (BUSPAR ) 5 MG tablet Take 1  tablet (5 mg total) by mouth 2 (two) times daily. For anxiety 180 tablet 0   clopidogrel  (PLAVIX ) 75 MG tablet Take 1 tablet (75 mg total) by mouth daily. 30 tablet 6   escitalopram  (LEXAPRO ) 20 MG tablet TAKE 1 TABLET BY MOUTH DAILY FOR ANXIETY 90 tablet 1   famotidine  (PEPCID ) 20 MG tablet Take 20 mg by mouth daily.     ferrous sulfate  325 (65 FE) MG tablet Take 650 mg by mouth daily with  breakfast.     fluticasone -salmeterol (WIXELA INHUB) 250-50 MCG/ACT AEPB INHALE 1 PUFF INTO THE LUNGS IN THE MORNING AND AT BEDTIME. (Patient taking differently: Inhale 1 puff into the lungs 2 (two) times daily.) 180 each 1   lisinopril  (ZESTRIL ) 30 MG tablet Take 1 tablet (30 mg total) by mouth daily. for blood pressure. 90 tablet 0   metoprolol  tartrate (LOPRESSOR ) 25 MG tablet Take 1 tablet (25 mg total) by mouth 2 (two) times daily. 60 tablet 0   oxyCODONE -acetaminophen  (PERCOCET/ROXICET) 5-325 MG tablet Take 1 tablet by mouth every 6 (six) hours as needed for moderate pain (pain score 4-6). (Patient taking differently: Take 1 tablet by mouth daily as needed for moderate pain (pain score 4-6).) 15 tablet 0   pyridoxine (B-6) 100 MG tablet Take 100 mg by mouth daily.     sodium chloride  1 g tablet TAKE 1 TABLET (1 G TOTAL) BY MOUTH 2 (TWO) TIMES DAILY WITH A MEAL. TO INCREASE SODIUM (Patient taking differently: Take 1 g by mouth 2 (two) times daily with a meal.) 60 tablet 0   vitamin B-12 (CYANOCOBALAMIN ) 1000 MCG tablet Take 1,000 mcg by mouth daily.     vitamin E 180 MG (400 UNITS) capsule Take 400 Units by mouth daily.      Allergies as of 02/17/2024   (No Known Allergies)    Family History  Problem Relation Age of Onset   Arthritis Mother    Cervical cancer Mother    Heart disease Father    Hypertension Father     Social History   Socioeconomic History   Marital status: Widowed    Spouse name: Not on file   Number of children: 2   Years of education: Not on file   Highest education level: 12th grade  Occupational History   Not on file  Tobacco Use   Smoking status: Former    Current packs/day: 0.00    Average packs/day: 0.8 packs/day for 38.0 years (28.5 ttl pk-yrs)    Types: Cigarettes    Start date: 05/06/1985    Quit date: 05/07/2023    Years since quitting: 0.7   Smokeless tobacco: Never  Vaping Use   Vaping status: Never Used  Substance and Sexual Activity    Alcohol use: Yes    Alcohol/week: 1.0 standard drink of alcohol    Types: 1 Standard drinks or equivalent per week    Comment: occasionally   Drug use: No   Sexual activity: Not Currently    Birth control/protection: Post-menopausal  Other Topics Concern   Not on file  Social History Narrative   Widow.    2 children.   Works in Clinical biochemist.   Enjoys reading, cooking.    Social Drivers of Health   Financial Resource Strain: Patient Declined (01/28/2024)   Overall Financial Resource Strain (CARDIA)    Difficulty of Paying Living Expenses: Patient declined  Food Insecurity: Food Insecurity Present (02/13/2024)   Hunger Vital Sign    Worried About Running Out  of Food in the Last Year: Sometimes true    Ran Out of Food in the Last Year: Sometimes true  Transportation Needs: No Transportation Needs (02/13/2024)   PRAPARE - Administrator, Civil Service (Medical): No    Lack of Transportation (Non-Medical): No  Recent Concern: Transportation Needs - Unmet Transportation Needs (11/29/2023)   PRAPARE - Transportation    Lack of Transportation (Medical): No    Lack of Transportation (Non-Medical): Yes  Physical Activity: Insufficiently Active (01/28/2024)   Exercise Vital Sign    Days of Exercise per Week: 4 days    Minutes of Exercise per Session: 20 min  Stress: Stress Concern Present (01/28/2024)   [name] of Occupational Health - Occupational Stress Questionnaire    Feeling of Stress: Very much  Social Connections: Socially Isolated (01/28/2024)   Social Connection and Isolation Panel    Frequency of Communication with Friends and Family: Three times a week    Frequency of Social Gatherings with Friends and Family: Patient declined    Attends Religious Services: Patient declined    Active Member of Clubs or Organizations: No    Attends Engineer, structural: Not on file    Marital Status: Widowed  Intimate Partner Violence: Not on file    Review  of Systems: Pertinent positive and negative review of systems were noted in the above HPI section.  All other review of systems was otherwise negative.   Physical Exam: Vital signs in last 24 hours: Temp:  [97.9 F (36.6 C)-98.2 F (36.8 C)] 98.2 F (36.8 C) (07/15 1040) Pulse Rate:  [51-155] 91 (07/15 1416) Resp:  [15-32] 20 (07/15 1416) BP: (105-164)/(67-98) 124/69 (07/15 1416) SpO2:  [93 %-97 %] 96 % (07/15 1416) Weight:  [73.9 kg] 73.9 kg (07/14 1529)   General:   Alert,  Well-developed, well-nourished, older white female pleasant and cooperative in NAD Head:  Normocephalic and atraumatic. Eyes:  Sclera clear, no icterus.   Conjunctiva pink. Ears:  Normal auditory acuity. Nose:  No deformity, discharge,  or lesions. Mouth:  No deformity or lesions.   Neck:  Supple; no masses or thyromegaly. Lungs:  Clear throughout to auscultation.   Few scattered rhonchi. Heart:  irRegular rate and rhythm; no murmurs, clicks, rubs,  or gallops. Abdomen:  Soft,nontender, BS active,nonpalp mass or hsm.   Rectal: Not done, patient showed me a picture of her stool. Msk:  Symmetrical without gross deformities. . Pulses:  Normal pulses noted. Extremities:  Without clubbing or edema. Neurologic:  Alert and  oriented x4;  grossly normal neurologically. Skin:  Intact without significant lesions or rashes.. Psych:  Alert and cooperative. Normal mood and affect.  Intake/Output from previous day: No intake/output data recorded. Intake/Output this shift: No intake/output data recorded.  Lab Results: Recent Labs    02/17/24 1544 02/17/24 2156 02/18/24 0436  WBC 12.2* 12.2* 11.7*  HGB 13.6 13.6 13.1  HCT 39.8 39.1 39.6  PLT 343 349 332   BMET Recent Labs    02/17/24 1544 02/17/24 2156 02/18/24 0436  NA 128* 131* 133*  K 3.9 3.7 3.7  CL 94* 99 99  CO2 22 22 25   GLUCOSE 107* 104* 106*  BUN 6* 5* 5*  CREATININE 0.55 0.46 0.56  CALCIUM  8.8* 9.0 9.1   LFT Recent Labs     02/17/24 1544  PROT 6.3*  ALBUMIN  3.5  AST 26  ALT 17  ALKPHOS 76  BILITOT 0.9   PT/INR Recent Labs  02/18/24 0014  LABPROT 14.0  INR 1.0   Hepatitis Panel No results for input(s): HEPBSAG, HCVAB, HEPAIGM, HEPBIGM in the last 72 hours.   IMPRESSION:  #73 63 year old white female who presented to the emergency room last evening generally not feeling well and concerned because she had a systolic blood pressure of 180 when she checked at home.  Found to be in A-fib with RVR, and started on Cardizem  drip.  #2 status post left carotid revascularization on 02/12/2024, and placed on Plavix  and aspirin  postprocedure #3 episode of atrial fibrillation during her hospitalization last week and started on Eliquis .  #4 low-grade GI bleeding over the past 3 days with 1 bowel movement each day which per patient description and image was very dark blackish in the bowl with surrounding reddish tent to water BUN has been normal. Etiology of her bleeding is not clear though suggestive of upper gut source. She does have previous history of iron deficiency anemia and EGD and colonoscopy May 2021 with finding of a single AVM in the cecum which was treated with APC in one 7 mm tubular adenoma was removed. Certainly she may have other AVMs and could have bleeding from the stomach or small bowel secondary to an AVM.  Fortunately thus far bleeding has been low-grade in setting of anticoagulation and antiplatelet therapy and her hemoglobin is normal   Plan; full liquid diet, n.p.o. in a.m. Trend hemoglobin will check again in a.m. then every 12 hours Hold Eliquis  as okay with cardiology Continue Plavix  and aspirin  for now Cover with IV PPI twice daily as doing  She likely will require at least diagnostic endoscopic evaluation, may consider EGD enteroscopy first once Eliquis  has washed out (by tomorrow afternoon).  Depending on status with A-fib/RVR can decide on timing of any endoscopic  evaluation within the next 24 to 48 hours. GI will follow with you       Genelle Economou PA-C 02/18/2024, 2:39 PM

## 2024-02-18 NOTE — Progress Notes (Signed)
 [facility] PROGRESS NOTE  Patient: Denise [name] FMW:[id number]   PCP: Gretta Comer POUR, NP DOB: 1961/03/09   DOA: 02/17/2024   DOS: 02/18/2024    Subjective: Patient was admitted last night.  Per triage note brought in by EMS for high blood pressure, blood in the stool and tachycardia. Currently being admitted for A-fib with RVR as well as BRBPR. Cardiology, vascular surgery as well as GI following. Patient reports severe headache mostly in the frontal region throbbing in nature.  Pain at the intervention site for the vascular surgery.  Also reports shortness of breath and fatigue. Reports that she has BRBPR to 3 episodes so far since her recent hospitalization.  Objective:  Vitals:   02/18/24 0627 02/18/24 1040 02/18/24 1416 02/18/24 1530  BP: (!) 164/78 (!) 135/97 124/69   Pulse: 72 93 91 82  Resp: 15 (!) 32 20 19  Temp:  98.2 F (36.8 C)  97.9 F (36.6 C)  TempSrc:  Oral  Oral  SpO2: 95% 96% 96% 97%   Clear to auscultation. S1-S2 present for Person present. No edema. No focal deficit.  Since last encounter, pertinent lab results CBC and BMP   . I have ordered test including CBC and BMP and echocardiogram  . I have discussed pt's care plan and test results with vascular surgery, GI, cardiology  .   Assessment and plan: Paroxysmal A-fib with RVR.  With frequent PVCs. Was on Cardizem  drip.  That is currently discontinued. Patient's home metoprolol  was not resumed by the admitting provider, I have resumed that medication. Resumed other medication including iron, Pepcid , Lexapro , Lipitor , Plavix . Limited echocardiogram is also requested given her ongoing tachycardia and frequent PVCs.  Discussed with cardiology.  Patient was started on amiodarone  drip for better rate control. Okay to stop anticoagulation for now while the bleeding workup is underway.  Carotid artery stenosis status post left TCAR 7/9. Requested vascular surgery consult with regards to her  antiplatelet medications. They would like to continue at least 1 antiplatelet medication and preferably Plavix  in that situation.  BRBPR Discussed with GI Okay to continue Plavix  from their perspective. Patient reports bleeding although her hemoglobin has been stable around 13. She does have significant history of GI bleed with hemoglobin drop in the liver down to 3 in the past with AVM. GI currently want to hold off on any intervention until her A-fib is under control.  Headache. CT head unremarkable. No focal deficit. Continue pain control with Percocet.  Hyponatremia. Patient is on home salt tablet although for now I am going to hold off and monitor. Spent 40 minutes examining the patient as well as discussing the plan with various consultants.  Author: Yetta Blanch, MD [facility] 02/18/2024 5:52 PM   If 7PM-7AM, please contact night-coverage at [email or web address]

## 2024-02-18 NOTE — ED Notes (Addendum)
 Stop diltazem per MD Gail.

## 2024-02-18 NOTE — Progress Notes (Signed)
 Rounding Note   Patient Name: Denise [name] Date of Encounter: 02/18/2024  [facility] Cardiologist: Shelda Bruckner, MD   Subjective Notes palpitations with ambulation.  Still mild GI bleeding.   Scheduled Meds:  busPIRone   5 mg Oral BID   metoprolol  tartrate  25 mg Oral BID   pantoprazole  (PROTONIX ) IV  40 mg Intravenous BID   Continuous Infusions:  diltiazem  (CARDIZEM ) infusion Stopped (02/18/24 0033)   PRN Meds: acetaminophen , melatonin, oxyCODONE -acetaminophen , polyethylene glycol, prochlorperazine    Vital Signs  Vitals:   02/18/24 0321 02/18/24 0605 02/18/24 0627 02/18/24 1040  BP: (!) 140/81  (!) 164/78 (!) 135/97  Pulse: 72  72 93  Resp: 17  15 (!) 32  Temp:  97.9 F (36.6 C)  98.2 F (36.8 C)  TempSrc:  Oral  Oral  SpO2: 95%  95% 96%   No intake or output data in the 24 hours ending 02/18/24 1125    02/17/2024    3:29 PM 02/12/2024    6:32 AM 02/03/2024    8:54 AM  Last 3 Weights  Weight (lbs) 163 lb 163 lb 163 lb 12.8 oz  Weight (kg) 73.936 kg 73.936 kg 74.299 kg      Telemetry Back in atrial fibrillation with rvR.  - Personally Reviewed  ECG  Sinus rhythm.  Rate 73 bpm.   - Personally Reviewed  Physical Exam  VS:  BP (!) 135/97 (BP Location: Left Arm)   Pulse 93   Temp 98.2 F (36.8 C) (Oral)   Resp (!) 32   SpO2 96%  , BMI There is no height or weight on file to calculate BMI. GENERAL: No acute distress.  HEENT: Pupils equal round and reactive, fundi not visualized, oral mucosa unremarkable NECK:  No jugular venous distention, waveform within normal limits, carotid upstroke brisk and symmetric, no bruits, no thyromegaly LUNGS:  Clear to auscultation bilaterally HEART:  Tachycardic.  Irregularly irregular.  PMI not displaced or sustained,S1 and S2 within normal limits, no S3, no S4, no clicks, no rubs, II/VI systolic murmur ABD:  Flat, positive bowel sounds normal in frequency in pitch, no bruits, no rebound, no guarding,  no midline pulsatile mass, no hepatomegaly, no splenomegaly EXT:  2 plus pulses throughout, no edema, no cyanosis no clubbing SKIN:  No rashes no nodules NEURO:  Cranial nerves II through XII grossly intact, motor grossly intact throughout [facility]:  Cognitively intact, oriented to person place and time   Labs High Sensitivity Troponin:   Recent Labs  Lab 02/17/24 1544 02/17/24 1744  TROPONINIHS 7 7     Chemistry Recent Labs  Lab 02/13/24 0315 02/14/24 0338 02/16/24 1429 02/17/24 1544 02/17/24 2156 02/18/24 0436  NA 130*   < > 130* 128* 131* 133*  K 5.0   < > 4.1 3.9 3.7 3.7  CL 97*   < > 99 94* 99 99  CO2 24   < > 22 22 22 25   GLUCOSE 128*   < > 119* 107* 104* 106*  BUN 8   < > 17 6* 5* 5*  CREATININE 0.70   < > 0.72 0.55 0.46 0.56  CALCIUM  9.0   < > 9.3 8.8* 9.0 9.1  MG 2.4  --  1.8  --   --  2.0  PROT  --   --  6.3* 6.3*  --   --   ALBUMIN   --   --  3.5 3.5  --   --   AST  --   --  23 26  --   --   ALT  --   --  19 17  --   --   ALKPHOS  --   --  78 76  --   --   BILITOT  --   --  0.7 0.9  --   --   GFRNONAA >60   < > >60 >60 >60 >60  ANIONGAP 9   < > 9 12 10 9    < > = values in this interval not displayed.    Lipids  Recent Labs  Lab 02/13/24 0315  CHOL 137  TRIG 70  HDL 56  LDLCALC 67  CHOLHDL 2.4    Hematology Recent Labs  Lab 02/17/24 1544 02/17/24 2156 02/18/24 0436  WBC 12.2* 12.2* 11.7*  RBC 4.32 4.27 4.18  HGB 13.6 13.6 13.1  HCT 39.8 39.1 39.6  MCV 92.1 91.6 94.7  MCH 31.5 31.9 31.3  MCHC 34.2 34.8 33.1  RDW 19.9* 19.9* 20.0*  PLT 343 349 332   Thyroid  Recent Labs  Lab 02/12/24 1438 02/13/24 0315  TSH 0.340*  --   FREET4  --  1.01    BNP Recent Labs  Lab 02/18/24 0014  BNP 256.1*    DDimer No results for input(s): DDIMER in the last 168 hours.   Radiology  CT Head Wo Contrast Result Date: 02/17/2024 CLINICAL DATA:  Headache, new onset, hypertension EXAM: CT HEAD WITHOUT CONTRAST TECHNIQUE: Contiguous axial images were  obtained from the base of the skull through the vertex without intravenous contrast. RADIATION DOSE REDUCTION: This exam was performed according to the departmental dose-optimization program which includes automated exposure control, adjustment of the mA and/or kV according to patient size and/or use of iterative reconstruction technique. COMPARISON:  CT head 10/26/2023 FINDINGS: Brain: No intracranial hemorrhage, mass effect, or evidence of acute infarct. No hydrocephalus. No extra-axial fluid collection. Vascular: No hyperdense vessel or unexpected calcification. Skull: No fracture or focal lesion. Sinuses/Orbits: No acute finding. Other: None. IMPRESSION: No acute intracranial abnormality. Electronically Signed   By: Norman Gatlin M.D.   On: 02/17/2024 23:58    Cardiac Studies  Echo 10/15/23:  1. Left ventricular ejection fraction, by estimation, is 70 to 75%. The  left ventricle has hyperdynamic function. The left ventricle has no  regional wall motion abnormalities. There is moderate concentric left  ventricular hypertrophy. Left ventricular  diastolic parameters are consistent with Grade I diastolic dysfunction  (impaired relaxation).   2. Right ventricular systolic function is normal. The right ventricular  size is normal.   3. Left atrial size was moderately dilated.   4. The mitral valve is normal in structure. Trivial mitral valve  regurgitation. No evidence of mitral stenosis.   5. The aortic valve is tricuspid. There is moderate calcification of the  aortic valve. Aortic valve regurgitation is mild. Moderate aortic valve  stenosis. Aortic regurgitation PHT measures 404 msec. Aortic valve area,  by VTI measures 1.21 cm. Aortic  valve mean gradient measures 25.7 mmHg. Aortic valve Vmax measures 3.54  m/s.   6. The inferior vena cava is normal in size with greater than 50%  respiratory variability, suggesting right atrial pressure of 3 mmHg.   Carotid Dopplers 01/2024: Summary:   Right Carotid: Evidence consistent with a total occlusion of the right  ICA. The                 distal CCA appears occluded.   Left Carotid: Velocities in the left ICA  are consistent with a 80-99%  stenosis.               The ECA appears >50% stenosed.   Vertebrals:  Bilateral vertebral arteries demonstrate antegrade flow.  Subclavians: Normal flow hemodynamics were seen in bilateral subclavian               arteries.   Patient Profile   63 y.o. female with carotid stenosis s/p transcarotid revascularization 02/12/24, post-op atrial fibrillation, COPD, cecal AVM, GIB, hemorrhoids admitted with symptomatic GI bleed.  Cardiology consulted for atrial fibrillation with RVR.   Assessment & Plan   # Atrial fibrillation with RVR:  New after carotid intervention 02/2024.  Initially managed on oral metoprolol .  She was started on a diltiazem  infusion this admission.  Now discontinued and she She continues to go in and out of atrial fibrillation.  OK to hold Eliquis  for now.  Awaiting GI evaluation.  Will start amiodarone  to attempt to maintain sinus rhythm.  Most important now is maintaining the patency of her carotid intervention.  Defer antiplatelets to Vascular Surgery. Continue metoprolol .   # Carotid stenosis: Underwent transcarotid revascularization 02/12/24.  She has been on ASA, Eliquis  and Plavix .  She is supposed to stop Plavix  in one month if follow up imaging is stable.  Recommend discussion with Vascular surgery regarding her antiplatelets.   # Hyperlipidemia:  Continue atorvastatin .  # GI Bleed:  She is on triple therapy after TCAR. H/H stable but she continues to bleed.  Holding Eliquis .  Awaiting GI evaluation.   # Hypertension:  Home amlodipine , lisinopril  on hold.  Continue metoprolol .     For questions or updates, please contact [facility] Please consult [email or web address] for contact info under     Signed, Annabella Scarce, MD  02/18/2024, 11:25 AM

## 2024-02-18 NOTE — Transitions of Care (Post Inpatient/ED Visit) (Signed)
 02/18/2024  Patient ID: Denise [name], female   DOB: [birth year], 62 y.o.   MRN: [id number]  TOC chart review.  Patient readmitted to the hospital on 02/17/24.   Arvin Seip RN, BSN, CCM [employer], Population Health Case Manager Phone: [phone]

## 2024-02-18 NOTE — ED Notes (Signed)
 Patient feeling very anxious, having moments she becomes overwhelmed, feels like her HR is racing and restless.  MD paged

## 2024-02-18 NOTE — Consult Note (Addendum)
    Patient admitted with GI bleed after carotid intervention currently on aspirin , Plavix  and Eliquis .  Cardiology has given the okay to hold Eliquis  and given her stable h/h from recent discharge hopefully this will be satisfactory to prevent further bleeding and or symptoms.  From a stent standpoint given that it is less than 29 week old she really needs to have at least 1 antiplatelet agent on board and this would preferably be Plavix  and aspirin  could be held.  If aspirin  does need to be held I would recommend checking TEG to ensure that she is a Plavix  responder.  Anuradha Chabot C. Sheree, MD [facility] Office: [phone] Pager: [phone]

## 2024-02-18 NOTE — Progress Notes (Signed)
 [facility] night cross cover note:   I was notified by the patient's RN that the patient is fidgety, and complaining of some anxiety.  The patient has a documented history of generalized anxiety disorder, for which she is on scheduled BuSpar  as an outpatient.  It appears that she missed her scheduled dose of BuSpar  this morning, and is scheduled to receive her next dose at 2200 this evening.  I conveyed to the patient's RN that it is okay to give this next dose of BuSpar  early, specifically it is okay to give the next dose of BuSpar  now. additionally, I have added Prn IV Ativan  for any residual anxiety.     Eva Pore, DO Hospitalist

## 2024-02-18 NOTE — ED Notes (Signed)
 Called CCMD and placed patient on monitor.

## 2024-02-18 NOTE — H&P (Signed)
 History and Physical  [name] FMW:[id number] DOB: [birth year] DOA: 02/17/2024  Referring physician: Francis Fleeting, PA-EDP  PCP: Gretta Comer POUR, NP  Outpatient Specialists: Cardiology, Vascular surgery. Patient coming from: Home  Chief Complaint: Feeling unwell   HPI: Denise [name] is a 63 y.o. female with medical history significant for carotid artery stenosis status post left transcarotid artery revascularization on 02/12/2024, on triple therapy Eliquis /aspirin /Plavix  x 1 month, postoperative atrial fibrillation, chronic anxiety, COPD, history of GI bleed, AVM in the cecum, external hemorrhoids, who presents to the ER due to feeling unwell.  She checked her blood pressure and it was elevated.  She decided to come to the ER for further evaluation.  Additionally, the patient endorses painless lower GI bleed.  She noticed bright red blood around her stools.  It started on Saturday, 4 days ago.  In the ER, found to be in A-fib with RVR.  Cardizem  drip initiated in the ED for rate control.  Seen by cardiology.  Due to ongoing intermittent lower GI bleeding, GI was consulted by EDP.  TRH, hospitalist service, was asked to admit.  ED Course: Temperature 97.9.  BP 108/94.  Pulse 73, respiratory 17, O2 saturation 97% on room air.  Lab studies notable for WBC 12.2.  Serum sodium 131, glucose 104, BNP 256.  Review of Systems: Review of systems as noted in the HPI. All other systems reviewed and are negative.   Past Medical History:  Diagnosis Date   Allergy    Anemia 12/24/2019   Anxiety    Aortic insufficiency    Aortic stenosis    Arthritis    Carotid artery disease (HCC)    Centrilobular emphysema (HCC) 11/2023   Chickenpox    Cholecystitis with cholelithiasis 03/31/2012   Closed fracture of L4 transverse process s/p traumatic mechanical fall 10/26/2023   COPD (chronic obstructive pulmonary disease) (HCC)    Coronary artery disease    Depression    Diastolic  dysfunction    Dyspnea    with exertion   GERD (gastroesophageal reflux disease)    Headache    Heart murmur    High anion gap metabolic acidosis 10/26/2023   History of blood transfusion 2021   HLD (hyperlipidemia)    Hypertension 03/31/2012   Hyponatremia in setting of SIADH    Left lower lobe pulmonary nodule 11/2023   Long-term use of aspirin  therapy    Lumbar compression fracture s/p traumatic mechanical fall; L4 superior endplate    Pneumonia    x 2   PVD (peripheral vascular disease) (HCC)    Shingles    SIADH (syndrome of inappropriate ADH production) (HCC)    Traumatic mechanical fall 10/26/2023   a.) s/p traumatic fall 10/26/2023 reuslting in multiple RIGHT rib (5th - 7th) fractures (required dchest tube), l$ transverse process fracture, L4 superior endplate compression fracture, large volume post-traumatic myofacial/subcutaneous gas collection in chest   Past Surgical History:  Procedure Laterality Date   BIOPSY  12/24/2019   Procedure: BIOPSY;  Surgeon: Teressa Toribio SQUIBB, MD;  Location: [facility] ENDOSCOPY;  Service: Endoscopy;;   BRONCHIAL BIOPSY  12/19/2023   Procedure: BRONCHOSCOPY, WITH BIOPSY;  Surgeon: Isadora Hose, MD;  Location: [facility] ENDOSCOPY;  Service: Pulmonary;;   BRONCHIAL BRUSHINGS  12/19/2023   Procedure: BRONCHOSCOPY, WITH BRUSH BIOPSY;  Surgeon: Isadora Hose, MD;  Location: [facility] ENDOSCOPY;  Service: Pulmonary;;   BRONCHIAL NEEDLE ASPIRATION BIOPSY  12/19/2023   Procedure: BRONCHOSCOPY, WITH NEEDLE ASPIRATION BIOPSY;  Surgeon: Isadora Hose, MD;  Location: [facility]  ENDOSCOPY;  Service: Pulmonary;;   BRONCHIAL WASHINGS  12/19/2023   Procedure: IRRIGATION, BRONCHUS;  Surgeon: Isadora Hose, MD;  Location: [facility] ENDOSCOPY;  Service: Pulmonary;;   BRONCHOSCOPY, WITH BIOPSY USING ELECTROMAGNETIC NAVIGATION Bilateral 12/19/2023   Procedure: ROBOTIC ASSISTED NAVIGATIONAL BRONCHOSCOPY;  Surgeon: Isadora Hose, MD;  Location: [facility] ENDOSCOPY;  Service: Pulmonary;  Laterality:  Bilateral;   CATARACT EXTRACTION Bilateral 2021   CHOLECYSTECTOMY  03/30/2012   Procedure: LAPAROSCOPIC CHOLECYSTECTOMY WITH INTRAOPERATIVE CHOLANGIOGRAM;  Surgeon: Morene ONEIDA Olives, MD;  Location: [facility] ORS;  Service: General;  Laterality: N/A;   COLONOSCOPY WITH PROPOFOL  N/A 12/24/2019   Procedure: COLONOSCOPY WITH PROPOFOL ;  Surgeon: Teressa Toribio SQUIBB, MD;  Location: [facility] ENDOSCOPY;  Service: Endoscopy;  Laterality: N/A;   ESOPHAGOGASTRODUODENOSCOPY (EGD) WITH PROPOFOL  N/A 12/24/2019   Procedure: ESOPHAGOGASTRODUODENOSCOPY (EGD) WITH PROPOFOL ;  Surgeon: Teressa Toribio SQUIBB, MD;  Location: [facility] ENDOSCOPY;  Service: Endoscopy;  Laterality: N/A;   HOT HEMOSTASIS N/A 12/24/2019   Procedure: HOT HEMOSTASIS (ARGON PLASMA COAGULATION/BICAP);  Surgeon: Teressa Toribio SQUIBB, MD;  Location: [facility] ENDOSCOPY;  Service: Endoscopy;  Laterality: N/A;   POLYPECTOMY  12/24/2019   Procedure: POLYPECTOMY;  Surgeon: Teressa Toribio SQUIBB, MD;  Location: [facility] ENDOSCOPY;  Service: Endoscopy;;   TRANSCAROTID ARTERY REVASCULARIZATION  Left 02/12/2024   Procedure: TRANSCAROTID ARTERY REVASCULARIZATION (TCAR);  Surgeon: Gretta Lonni PARAS, MD;  Location: [facility] OR;  Service: Vascular;  Laterality: Left;   TUBAL LIGATION  1997   VIDEO BRONCHOSCOPY WITH ENDOBRONCHIAL ULTRASOUND  12/19/2023   Procedure: BRONCHOSCOPY, WITH EBUS;  Surgeon: Isadora Hose, MD;  Location: [facility] ENDOSCOPY;  Service: Pulmonary;;   VIDEO BRONCHOSCOPY WITH RADIAL ENDOBRONCHIAL ULTRASOUND  12/19/2023   Procedure: VIDEO BRONCHOSCOPY WITH RADIAL ENDOBRONCHIAL ULTRASOUND;  Surgeon: Isadora Hose, MD;  Location: [facility] ENDOSCOPY;  Service: Pulmonary;;    Social History:  reports that she quit smoking about 9 months ago. Her smoking use included cigarettes. She started smoking about 38 years ago. She has a 28.5 pack-year smoking history. She has never used smokeless tobacco. She reports current alcohol use of about 1.0 standard drink of alcohol per week. She reports that she does not  use drugs.   No Known Allergies  Family History  Problem Relation Age of Onset   Arthritis Mother    Cervical cancer Mother    Heart disease Father    Hypertension Father       Prior to Admission medications   Medication Sig Start Date End Date Taking? Authorizing Provider  acetaminophen  (TYLENOL ) 500 MG tablet Take 2 tablets (1,000 mg total) by mouth every 8 (eight) hours as needed. 10/31/23   Maczis, Michael M, PA-C  albuterol  (VENTOLIN  HFA) 108 (90 Base) MCG/ACT inhaler INHALE 2 PUFFS INTO THE LUNGS EVERY 4-6 HOURS AS NEEDED FOR TIGHTNESS/WHEEZING 01/31/24   Clark, Katherine K, NP  amLODipine  (NORVASC ) 10 MG tablet TAKE 1 TABLET BY MOUTH EVERY DAY FOR BLOOD PRESSURE 12/15/23   Gretta Comer POUR, NP  apixaban  (ELIQUIS ) 5 MG TABS tablet Take 1 tablet (5 mg total) by mouth 2 (two) times daily. 02/14/24   Bethanie Cough, PA-C  Ascorbic Acid (VITAMIN C) 1000 MG tablet Take 1,000 mg by mouth daily.    [provider]  aspirin  EC 81 MG tablet Take 162 mg by mouth daily.    [provider]  atorvastatin  (LIPITOR ) 80 MG tablet TAKE 1 TABLET (80 MG TOTAL) BY MOUTH DAILY FOR CHOLESTEROL 01/15/24 01/14/25  Gretta Comer POUR, NP  busPIRone  (BUSPAR ) 5 MG tablet Take 1 tablet (5 mg total) by  mouth 2 (two) times daily. For anxiety 01/31/24   Clark, Katherine K, NP  cetirizine (ZYRTEC) 10 MG tablet Take 10 mg by mouth daily as needed for allergies.    [provider]  clopidogrel  (PLAVIX ) 75 MG tablet Take 1 tablet (75 mg total) by mouth daily. 02/10/24   Gretta Lonni PARAS, MD  diphenhydrAMINE  (BENADRYL ) 25 MG tablet Take 25 mg by mouth every 6 (six) hours as needed for allergies.    [provider]  escitalopram  (LEXAPRO ) 20 MG tablet TAKE 1 TABLET BY MOUTH DAILY FOR ANXIETY 12/15/23   Clark, Katherine K, NP  famotidine  (PEPCID ) 20 MG tablet Take 20 mg by mouth daily.    [provider]  ferrous sulfate  325 (65 FE) MG tablet Take 325 mg by mouth daily with  breakfast.    [provider]  fluticasone -salmeterol (WIXELA INHUB) 250-50 MCG/ACT AEPB INHALE 1 PUFF INTO THE LUNGS IN THE MORNING AND AT BEDTIME. 08/18/23   Clark, Katherine K, NP  lisinopril  (ZESTRIL ) 30 MG tablet Take 1 tablet (30 mg total) by mouth daily. for blood pressure. 01/31/24   Clark, Katherine K, NP  metoprolol  tartrate (LOPRESSOR ) 25 MG tablet Take 1 tablet (25 mg total) by mouth 2 (two) times daily. 02/14/24   Bethanie Cough, PA-C  oxyCODONE -acetaminophen  (PERCOCET/ROXICET) 5-325 MG tablet Take 1 tablet by mouth every 6 (six) hours as needed for moderate pain (pain score 4-6). 02/14/24   Bethanie Cough, PA-C  pyridoxine (B-6) 100 MG tablet Take 100 mg by mouth daily.    [provider]  sodium chloride  1 g tablet TAKE 1 TABLET (1 G TOTAL) BY MOUTH 2 (TWO) TIMES DAILY WITH A MEAL. TO INCREASE SODIUM 01/07/24   Clark, Katherine K, NP  vitamin B-12 (CYANOCOBALAMIN ) 1000 MCG tablet Take 1,000 mcg by mouth daily.    [provider]  vitamin E 180 MG (400 UNITS) capsule Take 400 Units by mouth daily.    [provider]    Physical Exam: BP 105/86   Pulse (!) 51   Temp 97.9 F (36.6 C) (Oral)   Resp 20   SpO2 97%   General: 63 y.o. year-old female well developed well nourished in no acute distress.  Alert and oriented x3. Cardiovascular: Regular rate and rhythm with no rubs or gallops.  No thyromegaly or JVD noted.  No lower extremity edema. 2/4 pulses in all 4 extremities. Respiratory: Clear to auscultation with no wheezes or rales. Good inspiratory effort. Abdomen: Soft nontender nondistended with normal bowel sounds x4 quadrants. Muskuloskeletal: No cyanosis, clubbing or edema noted bilaterally Neuro: CN II-XII intact, strength, sensation, reflexes Skin: Left chest wall and left neck bruising post procedure. Psychiatry: Judgement and insight appear normal. Mood is appropriate for condition and setting          Labs on Admission:  Basic  Metabolic Panel: Recent Labs  Lab 02/12/24 1438 02/13/24 0315 02/14/24 0338 02/16/24 1429 02/17/24 1544 02/17/24 2156  NA 133* 130* 132* 130* 128* 131*  K 4.3 5.0 4.3 4.1 3.9 3.7  CL 101 97* 99 99 94* 99  CO2 21* 24 25 22 22 22   GLUCOSE 132* 128* 93 119* 107* 104*  BUN 8 8 5* 17 6* 5*  CREATININE 0.72 0.70 0.49 0.72 0.55 0.46  CALCIUM  8.6* 9.0 9.0 9.3 8.8* 9.0  MG 1.7 2.4  --  1.8  --   --    Liver Function Tests: Recent Labs  Lab 02/16/24 1429 02/17/24 1544  AST 23  26  ALT 19 17  ALKPHOS 78 76  BILITOT 0.7 0.9  PROT 6.3* 6.3*  ALBUMIN  3.5 3.5   No results for input(s): LIPASE, AMYLASE in the last 168 hours. No results for input(s): AMMONIA in the last 168 hours. CBC: Recent Labs  Lab 02/12/24 1438 02/13/24 0315 02/16/24 1429 02/17/24 1544 02/17/24 2156  WBC 11.1* 11.4* 12.4* 12.2* 12.2*  NEUTROABS  --   --  9.9* 9.4* 8.5*  HGB 13.8 13.9 13.8 13.6 13.6  HCT 40.8 41.6 41.3 39.8 39.1  MCV 92.5 94.1 93.4 92.1 91.6  PLT 249 248 328 343 349   Cardiac Enzymes: No results for input(s): CKTOTAL, CKMB, CKMBINDEX, TROPONINI in the last 168 hours.  BNP (last 3 results) Recent Labs    02/18/24 0014  BNP 256.1*    ProBNP (last 3 results) No results for input(s): PROBNP in the last 8760 hours.  CBG: No results for input(s): GLUCAP in the last 168 hours.  Radiological Exams on Admission: CT Head Wo Contrast Result Date: 02/17/2024 CLINICAL DATA:  Headache, new onset, hypertension EXAM: CT HEAD WITHOUT CONTRAST TECHNIQUE: Contiguous axial images were obtained from the base of the skull through the vertex without intravenous contrast. RADIATION DOSE REDUCTION: This exam was performed according to the departmental dose-optimization program which includes automated exposure control, adjustment of the mA and/or kV according to patient size and/or use of iterative reconstruction technique. COMPARISON:  CT head 10/26/2023 FINDINGS: Brain: No intracranial  hemorrhage, mass effect, or evidence of acute infarct. No hydrocephalus. No extra-axial fluid collection. Vascular: No hyperdense vessel or unexpected calcification. Skull: No fracture or focal lesion. Sinuses/Orbits: No acute finding. Other: None. IMPRESSION: No acute intracranial abnormality. Electronically Signed   By: Norman Gatlin M.D.   On: 02/17/2024 23:58    EKG: I independently viewed the EKG done and my findings are as followed: Sinus rhythm rate of 73.  Nonspecific ST-T changes.  QTc 447.  Assessment/Plan Present on Admission:  Atrial flutter with rapid ventricular response (HCC)  Principal Problem:   Atrial flutter with rapid ventricular response (HCC)  Atrial flutter/atrial fibrillation with rapid ventricular response Started on diltiazem  drip in the ER, wean off as able Seen by cardiology, provided recommendations. Closely monitor on telemetry.  Lower GI bleed, history of AVM in the cecum and external hemorrhoids GI consulted by EDP Started on IV PPI twice daily while on triple therapy as stated above  Carotid artery stenosis status post left transcarotid artery revascularization on 02/12/2024 Prior to admission on triple therapy, Eliquis , aspirin , and Plavix  Deferred management to cardiology and GI in the setting of GI bleeding GI consulted due to GI bleed. Cardiology consulted due to triple therapy IV Protonix  twice daily added for GI PPX.  Chronic anxiety Resume home regimen  Chronic hyponatremia At baseline Restart home salt tablet   Critical care time: 65 minutes.   DVT prophylaxis: Defer to cardiology in the setting of lower GI bleed.  Code Status: Full code  Family Communication: None at bedside.  Disposition Plan: Admitted to progressive care unit.  Consults called: Cardiology and GI consulted by EDP.  Admission status: Inpatient status with   Status is: Inpatient The patient requires at least 2 midnights for further evaluation and treatment  of pedunculation.   Terry LOISE Hurst MD [facility] Pager [phone]  If 7PM-7AM, please contact night-coverage [email or web address] Password [facility]  02/18/2024, 1:36 AM

## 2024-02-19 ENCOUNTER — Other Ambulatory Visit: Payer: Self-pay

## 2024-02-19 ENCOUNTER — Encounter (HOSPITAL_COMMUNITY): Payer: Self-pay | Admitting: Internal Medicine

## 2024-02-19 DIAGNOSIS — I350 Nonrheumatic aortic (valve) stenosis: Secondary | ICD-10-CM

## 2024-02-19 DIAGNOSIS — K5520 Angiodysplasia of colon without hemorrhage: Secondary | ICD-10-CM | POA: Diagnosis not present

## 2024-02-19 DIAGNOSIS — I10 Essential (primary) hypertension: Secondary | ICD-10-CM

## 2024-02-19 DIAGNOSIS — K922 Gastrointestinal hemorrhage, unspecified: Secondary | ICD-10-CM | POA: Diagnosis not present

## 2024-02-19 DIAGNOSIS — I4892 Unspecified atrial flutter: Secondary | ICD-10-CM | POA: Diagnosis not present

## 2024-02-19 DIAGNOSIS — I6522 Occlusion and stenosis of left carotid artery: Secondary | ICD-10-CM | POA: Diagnosis not present

## 2024-02-19 DIAGNOSIS — J449 Chronic obstructive pulmonary disease, unspecified: Secondary | ICD-10-CM

## 2024-02-19 LAB — CBC WITH DIFFERENTIAL/PLATELET
Abs Immature Granulocytes: 0.06 K/uL (ref 0.00–0.07)
Basophils Absolute: 0 K/uL (ref 0.0–0.1)
Basophils Relative: 0 %
Eosinophils Absolute: 0.2 K/uL (ref 0.0–0.5)
Eosinophils Relative: 2 %
HCT: 37.9 % (ref 36.0–46.0)
Hemoglobin: 13 g/dL (ref 12.0–15.0)
Immature Granulocytes: 1 %
Lymphocytes Relative: 14 %
Lymphs Abs: 1.4 K/uL (ref 0.7–4.0)
MCH: 31.6 pg (ref 26.0–34.0)
MCHC: 34.3 g/dL (ref 30.0–36.0)
MCV: 92.2 fL (ref 80.0–100.0)
Monocytes Absolute: 0.9 K/uL (ref 0.1–1.0)
Monocytes Relative: 9 %
Neutro Abs: 7.3 K/uL (ref 1.7–7.7)
Neutrophils Relative %: 74 %
Platelets: 333 K/uL (ref 150–400)
RBC: 4.11 MIL/uL (ref 3.87–5.11)
RDW: 20 % — ABNORMAL HIGH (ref 11.5–15.5)
WBC: 9.8 K/uL (ref 4.0–10.5)
nRBC: 0 % (ref 0.0–0.2)

## 2024-02-19 LAB — COMPREHENSIVE METABOLIC PANEL WITH GFR
ALT: 17 U/L (ref 0–44)
AST: 17 U/L (ref 15–41)
Albumin: 3.5 g/dL (ref 3.5–5.0)
Alkaline Phosphatase: 74 U/L (ref 38–126)
Anion gap: 10 (ref 5–15)
BUN: 6 mg/dL — ABNORMAL LOW (ref 8–23)
CO2: 23 mmol/L (ref 22–32)
Calcium: 9.2 mg/dL (ref 8.9–10.3)
Chloride: 101 mmol/L (ref 98–111)
Creatinine, Ser: 0.55 mg/dL (ref 0.44–1.00)
GFR, Estimated: >60 mL/min (ref >60)
Glucose, Bld: 109 mg/dL — ABNORMAL HIGH (ref 70–99)
Potassium: 4 mmol/L (ref 3.5–5.1)
Sodium: 134 mmol/L — ABNORMAL LOW (ref 135–145)
Total Bilirubin: 0.6 mg/dL (ref 0.0–1.2)
Total Protein: 6.1 g/dL — ABNORMAL LOW (ref 6.5–8.1)

## 2024-02-19 LAB — MAGNESIUM: Magnesium: 1.9 mg/dL (ref 1.7–2.4)

## 2024-02-19 MED ORDER — LISINOPRIL 20 MG PO TABS: 20.0000 mg | ORAL_TABLET | Freq: Every day | ORAL | 0 refills | Status: DC

## 2024-02-19 MED ORDER — PANTOPRAZOLE SODIUM 40 MG PO TBEC: 40.0000 mg | DELAYED_RELEASE_TABLET | Freq: Two times a day (BID) | ORAL | 0 refills | Status: DC

## 2024-02-19 MED ORDER — LEVALBUTEROL HCL 0.63 MG/3ML IN NEBU
0.6300 mg | INHALATION_SOLUTION | Freq: Two times a day (BID) | RESPIRATORY_TRACT | Status: DC
  Administered 2024-02-19: 0.63 mg via RESPIRATORY_TRACT
  Filled 2024-02-19: qty 3

## 2024-02-19 MED ORDER — AMIODARONE HCL 200 MG PO TABS: ORAL_TABLET | ORAL | 0 refills | Status: DC

## 2024-02-19 MED ORDER — METOPROLOL TARTRATE 50 MG PO TABS: 50.0000 mg | ORAL_TABLET | Freq: Two times a day (BID) | ORAL | 0 refills | Status: DC

## 2024-02-19 MED ORDER — AMIODARONE HCL 200 MG PO TABS: 200.0000 mg | ORAL_TABLET | Freq: Every day | ORAL | Status: DC

## 2024-02-19 MED ORDER — LISINOPRIL 20 MG PO TABS
20.0000 mg | ORAL_TABLET | Freq: Every day | ORAL | Status: DC
  Administered 2024-02-19: 20 mg via ORAL
  Filled 2024-02-19: qty 1

## 2024-02-19 MED ORDER — AMIODARONE HCL 200 MG PO TABS
200.0000 mg | ORAL_TABLET | Freq: Two times a day (BID) | ORAL | Status: DC
  Administered 2024-02-19: 200 mg via ORAL
  Filled 2024-02-19: qty 1

## 2024-02-19 MED ORDER — METOPROLOL TARTRATE 50 MG PO TABS
50.0000 mg | ORAL_TABLET | Freq: Two times a day (BID) | ORAL | Status: DC
  Administered 2024-02-19: 50 mg via ORAL
  Filled 2024-02-19: qty 1

## 2024-02-19 MED ORDER — NYSTATIN 100000 UNIT/ML MT SUSP
5.0000 mL | Freq: Four times a day (QID) | OROMUCOSAL | 0 refills | Status: AC
Start: 2024-02-19 — End: 2024-02-29

## 2024-02-19 NOTE — TOC Initial Note (Addendum)
 Transition of Care [facility]) - Initial/Assessment Note    Patient Details  Name: Denise [name] MRN: [id number] Date of Birth: 10/08/1960  [facility]) CM/SW Contact:    Lauraine FORBES Saa, LCSW Phone Number: 02/19/2024, 11:23 AM  Clinical Narrative:                  11:23 AM CSW introduced self and role to patient. Patient confirmed she resides at home with her daughter who is to provide transportation upon discharge. Patient stated son could also provide transportation upon discharge. Patient stated that she drove self to appointments prior to hospitalization and that adult children can also provide transportation to appointments if needed. Patient denied SNF history. Patient confirmed HH history (with [facility] per chart review). Patient stated that she has a BSC and rolling walker with [employer] (also has home oxygen and crutches per chart review). Per chart review, patient has a PCP and insurance. Patient's preferred pharmacy's are [facility] and [facility] [phone]. Patient declined CSW offer of SDOH (housing, food) resources.  Expected Discharge Plan: Home/Self Care Barriers to Discharge: Continued Medical Work up   Patient Goals and CMS Choice Patient states their goals for this hospitalization and ongoing recovery are:: to return home          Expected Discharge Plan and Services       Living arrangements for the past 2 months: Single Family Home                                      Prior Living Arrangements/Services Living arrangements for the past 2 months: Single Family Home Lives with:: Adult Children Patient language and need for interpreter reviewed:: Yes Do you feel safe going back to the place where you live?: Yes      Need for Family Participation in Patient Care: No (Comment)     Criminal Activity/Legal Involvement Pertinent to Current Situation/Hospitalization: No - Comment as needed  Activities of Daily Living   ADL Screening  (condition at time of admission) Independently performs ADLs?: Yes (appropriate for developmental age) Is the patient deaf or have difficulty hearing?: No Does the patient have difficulty seeing, even when wearing glasses/contacts?: No Does the patient have difficulty concentrating, remembering, or making decisions?: No  Permission Sought/Granted Permission sought to share information with : Family Supports Permission granted to share information with : No (Contact information on chart)  Share Information with NAME: Sylvie Mifsud     Permission granted to share info w Relationship: Daughter  Permission granted to share info w Contact Information: [phone]  Emotional Assessment Appearance:: Appears stated age Attitude/Demeanor/Rapport: Engaged Affect (typically observed): Accepting, Appropriate, Adaptable, Calm, Stable, Pleasant Orientation: : Oriented to Self, Oriented to Situation, Oriented to Place, Oriented to  Time Alcohol / Substance Use: Not Applicable Psych Involvement: No (comment)  Admission diagnosis:  Atrial flutter with rapid ventricular response (HCC) [I48.92] Atrial flutter, unspecified type [facility]) [I48.92] Patient Active Problem List   Diagnosis Date Noted   Atrial flutter with rapid ventricular response (HCC) 02/18/2024   Gastrointestinal hemorrhage 02/18/2024   Asymptomatic carotid artery stenosis without infarction, left 02/12/2024   Erythema of lower extremity 11/21/2023   SIADH (syndrome of inappropriate ADH production) (HCC) 11/05/2023   Traumatic pneumothorax 10/26/2023   Fall at home, initial encounter 10/26/2023   Elevated AST (SGOT) 10/26/2023   Closed fracture dislocation of thoracic spine (HCC) 10/26/2023  Multiple fractures of ribs, right side, initial encounter for closed fracture 10/26/2023   Visual disturbance 09/18/2023   Carotid artery disease (HCC) 06/04/2023   Frequent headaches 05/23/2023   Primary osteoarthritis of first carpometacarpal  joint of right hand 01/04/2023   Primary osteoarthritis of first carpometacarpal joint of left hand 01/04/2023   Right carpal tunnel syndrome 01/04/2023   Left carpal tunnel syndrome 01/04/2023   CAD (coronary artery disease) 12/27/2022   Encounter for annual general medical examination with abnormal findings in adult 12/27/2022   Chronic pain of both wrists 12/27/2022   Carotid atherosclerosis, bilateral 12/27/2022   Hyperlipidemia 12/27/2021   Iron deficiency anemia due to chronic blood loss 12/27/2021   Adnexal mass 09/08/2021   Anxiety and depression 06/16/2021   Lung nodule 12/30/2019   Sessile colonic polyp 12/30/2019   AVM (arteriovenous malformation) of colon 12/30/2019   Chest pressure 12/22/2019   Murmur 12/22/2019   Bilateral lower extremity edema 12/22/2019   Exertional dyspnea 12/22/2019   Symptomatic anemia 12/22/2019   Cardiomegaly 12/22/2019   Tobacco abuse 12/22/2019   Hyponatremia 12/22/2019   COPD (chronic obstructive pulmonary disease) (HCC) 12/22/2019   Former smoker 08/31/2019   Hypertension 03/31/2012   PCP:  Gretta Comer POUR, NP Pharmacy:   [phone] [address], [place] - [address] [place] [address] Phone: [phone] Fax: [phone]  [facility] [address] [place] [address] Phone: [phone] Fax: [phone]     Social Drivers of Health (SDOH) Social History: SDOH Screenings   Food Insecurity: Food Insecurity Present (02/19/2024)  Housing: High Risk (02/19/2024)  Transportation Needs: No Transportation Needs (02/19/2024)  Recent Concern: Transportation Needs - Unmet Transportation Needs (11/29/2023)  Utilities: Not At Risk (02/19/2024)  Alcohol Screen: Low Risk  (01/28/2024)  Depression (PHQ2-9): High Risk (01/31/2024)  Financial Resource Strain: Patient Declined (01/28/2024)  Physical Activity: Insufficiently Active (01/28/2024)  Social Connections: Unknown  (02/19/2024)  Recent Concern: Social Connections - Socially Isolated (01/28/2024)  Stress: Stress Concern Present (01/28/2024)  Tobacco Use: Medium Risk (02/19/2024)   SDOH Interventions: Food Insecurity Interventions: Patient Declined Housing Interventions: Patient Declined   Readmission Risk Interventions    02/13/2024    3:05 PM  Readmission Risk Prevention Plan  Post Dischage Appt Complete  Medication Screening Complete  Transportation Screening Complete

## 2024-02-19 NOTE — Assessment & Plan Note (Addendum)
 Prior to 02-19-2024 stable.  02-19-2024 stable.

## 2024-02-19 NOTE — Assessment & Plan Note (Signed)
 02-19-2024 echo shows mod/severe AS.  Moderate/severe AS. Mean gradient 34 mmHg,

## 2024-02-19 NOTE — Assessment & Plan Note (Addendum)
 Prior to 02-19-2024 prior hx of colonoscopy May 2021 with finding of a single AVM in the cecum which was treated with APC   02-19-2024 HgB is stable for now. Awaiting to see if GI wants to scope her again.

## 2024-02-19 NOTE — Assessment & Plan Note (Addendum)
 Prior to 02-19-2024 Was on Cardizem  drip.  That is currently discontinued. Patient's home metoprolol  was not resumed by the admitting provider, I have resumed that medication. Resumed other medication including iron, Pepcid , Lexapro , Lipitor , Plavix . Limited echocardiogram is also requested given her ongoing tachycardia and frequent PVCs.   Discussed with cardiology.  Patient was started on amiodarone  drip for better rate control. Okay to stop anticoagulation for now while the bleeding workup is underway.  02-19-2024 cards has changed IV amio over to po amio. Pt has f/u with cards on 02-27-2024. Stay off eliquis  and asa for now. Only plavix  for now.

## 2024-02-19 NOTE — Assessment & Plan Note (Signed)
 Prior to 02-19-2024 Patient is on home salt tablet although for now I am going to hold off and monitor.  02-19-2024 Na stable at 134

## 2024-02-19 NOTE — Progress Notes (Signed)
 RN provided patient with verbal discharge instructions. Paper copy of discharge summary provided to patient. RN answered all questions. VSS at discharge. IV's removed. Pt belongings sent with patient at discharge. MD note placed in pt d/c folder.  RN d/c patient via wheelchair to private vehicle.

## 2024-02-19 NOTE — Progress Notes (Signed)
 Mobility Specialist Progress Note;   02/19/24 0931  Mobility  Activity Ambulated independently in hallway  Level of Assistance Standby assist, set-up cues, supervision of patient - no hands on  Assistive Device None  Distance Ambulated (ft) 300 ft  Activity Response Tolerated well  Mobility Referral Yes  Mobility visit 1 Mobility  Mobility Specialist Start Time (ACUTE ONLY) 0931  Mobility Specialist Stop Time (ACUTE ONLY) [id number]  Mobility Specialist Time Calculation (min) (ACUTE ONLY) 7 min   Pt agreeable to mobility. Required no physical assistance during ambulation, SV. HR up to 122 bpm w/ activity. C/o some anxiety, otherwise asx. Pt returned back to sitting on EoB with all needs met, call bell in reach.   Lauraine Erm Mobility Specialist Please contact via SecureChat or [employer] [phone]

## 2024-02-19 NOTE — Assessment & Plan Note (Addendum)
 Prior to 02-19-2024  status post left TCAR 7/9. Requested vascular surgery consult with regards to her antiplatelet medications. They would like to continue at least 1 antiplatelet medication and preferably Plavix  in that situation.  02-19-2024 pt to stay on plavix  75 mg daily for now. Holding on ASA and Eliquis  until she can f/u with cards and vascular surgery as outpatient.

## 2024-02-19 NOTE — Discharge Summary (Signed)
 [facility] Phys[facility] Discharge Summary   Patient name: Denise [name]  Admit date:     02/17/2024  Discharge date: 02/19/2024  Attending Physician: SHONA TERRY SAILOR [[id number]]  Discharge Physician: Camellia Door   PCP: Gretta Comer POUR, NP  Admitted From: Home  Disposition:  Home  Recommendations for Outpatient Follow-up:  Follow up with PCP in 1-2 weeks Follow up with cardiology on 02-27-2024. Get a CBC during cards office visit to check HgB Follow up with [facility] for rectal bleeding Follow up with Dr. Gretta with vascular surgery   Home Health:No Equipment/Devices: None    Discharge Condition:Stable CODE STATUS:FULL Diet recommendation: Heart Healthy Fluid Restriction: None  Hospital Summary: HPI: Denise [name] is a 63 y.o. female with medical history significant for carotid artery stenosis status post left transcarotid artery revascularization on 02/12/2024, on triple therapy Eliquis /aspirin /Plavix  x 1 month, postoperative atrial fibrillation, chronic anxiety, COPD, history of GI bleed, AVM in the cecum, external hemorrhoids, who presents to the ER due to feeling unwell.  She checked her blood pressure and it was elevated.  She decided to come to the ER for further evaluation.   Additionally, the patient endorses painless lower GI bleed.  She noticed bright red blood around her stools.  It started on Saturday, 4 days ago.   In the ER, found to be in A-fib with RVR.  Cardizem  drip initiated in the ED for rate control.  Seen by cardiology.  Due to ongoing intermittent lower GI bleeding, GI was consulted by EDP.  [facility], hospitalist service, was asked to admit.   ED Course: Temperature 97.9.  BP 108/94.  Pulse 73, respiratory 17, O2 saturation 97% on room air.  Lab studies notable for WBC 12.2.  Serum sodium 131, glucose 104, BNP 256.  Significant Events: Admitted 02/17/2024 for aflutter with RVR 02-18-2024 developed rectal bleeding. GI consulted.  Admission  Labs: WBC 12.2, HgB 13.6, Plt 343 Na 128, K 3.9, CO2 of 22, BUN 6, Scr 0.55, glu 107  Admission Imaging Studies: CT head No acute intracranial abnormality   Significant Labs:   Significant Imaging Studies: Echo LVEF 65%. Moderate/severe AS. Mean gradient 34 mmHg,   Antibiotic Therapy: Anti-infectives (From admission, onward)    None       Procedures:   Consultants: Cardiology Vascular surgery GI   Hospital Course by Problem: * Atrial flutter with rapid ventricular response (HCC) Prior to 02-19-2024 Was on Cardizem  drip.  That is currently discontinued. Patient's home metoprolol  was not resumed by the admitting provider, I have resumed that medication. Resumed other medication including iron, Pepcid , Lexapro , Lipitor , Plavix . Limited echocardiogram is also requested given her ongoing tachycardia and frequent PVCs.   Discussed with cardiology.  Patient was started on amiodarone  drip for better rate control. Okay to stop anticoagulation for now while the bleeding workup is underway.  02-19-2024 cards has changed IV amio over to po amio. Pt has f/u with cards on 02-27-2024. Stay off eliquis  and asa for now. Only plavix  for now.  GI bleed Prior to 02-19-2024 Discussed with GI Okay to continue Plavix  from their perspective. Patient reports bleeding although her hemoglobin has been stable around 13. She does have significant history of GI bleed with hemoglobin drop in the liver down to 3 in the past with AVM. GI currently want to hold off on any intervention until her A-fib is under control.  02-19-2024 HgB is stable at 13 g/dl. No significant blood loss. GI approved full liquid diet today. Not  sure if they are going to scope her or not.   Asymptomatic carotid artery stenosis without infarction, left Prior to 02-19-2024  status post left TCAR 7/9. Requested vascular surgery consult with regards to her antiplatelet medications. They would like to continue at least 1  antiplatelet medication and preferably Plavix  in that situation.  02-19-2024 pt to stay on plavix  75 mg daily for now. Holding on ASA and Eliquis  until she can f/u with cards and vascular surgery as outpatient.  Aortic stenosis 02-19-2024 echo shows mod/severe AS.  Moderate/severe AS. Mean gradient 34 mmHg,   SIADH (syndrome of inappropriate ADH production) (HCC) Prior to 02-19-2024 Patient is on home salt tablet although for now I am going to hold off and monitor.  02-19-2024 Na stable at 134   AVM (arteriovenous malformation) of colon Prior to 02-19-2024 prior hx of colonoscopy May 2021 with finding of a single AVM in the cecum which was treated with APC   02-19-2024 HgB is stable for now. Awaiting to see if GI wants to scope her again.  COPD (chronic obstructive pulmonary disease) (HCC) Prior to 02-19-2024 stable.  02-19-2024 stable.    Discharge Diagnoses:  Principal Problem:   Atrial flutter with rapid ventricular response (HCC) Active Problems:   Asymptomatic carotid artery stenosis without infarction, left   GI bleed   COPD (chronic obstructive pulmonary disease) (HCC)   AVM (arteriovenous malformation) of colon   SIADH (syndrome of inappropriate ADH production) (HCC)   Aortic stenosis   Discharge Instructions  Discharge Instructions     Call MD for:  difficulty breathing, headache or visual disturbances   Complete by: As directed    Call MD for:  extreme fatigue   Complete by: As directed    Call MD for:  hives   Complete by: As directed    Call MD for:  persistant dizziness or light-headedness   Complete by: As directed    Call MD for:  persistant nausea and vomiting   Complete by: As directed    Call MD for:  redness, tenderness, or signs of infection (pain, swelling, redness, odor or green/yellow discharge around incision site)   Complete by: As directed    Call MD for:  severe uncontrolled pain   Complete by: As directed    Call MD for:  temperature  >100.4   Complete by: As directed    Diet - low sodium heart healthy   Complete by: As directed    Discharge instructions   Complete by: As directed    1. Follow up with your primary care provider in 1-2 weeks following discharge from hospital. 2. Follow up with cardiology on 02-27-2024 as scheduled. Make sure a CBC is drawn at that visit to check your HgB. 3. Follow up with vascular surgery regarding your carotid artery stent 4. Follow up with gastroenterology for outpatient colonoscopy.   Increase activity slowly   Complete by: As directed    No wound care   Complete by: As directed       Allergies as of 02/19/2024   No Known Allergies      Medication List     PAUSE taking these medications    ferrous sulfate  325 (65 FE) MG tablet Wait to take this until your doctor or other care provider tells you to start again. Take 650 mg by mouth daily with breakfast.       STOP taking these medications    amLODipine  10 MG tablet Commonly known as: NORVASC   aspirin  EC 81 MG tablet   Eliquis  5 MG Tabs tablet Generic drug: apixaban    famotidine  20 MG tablet Commonly known as: PEPCID        TAKE these medications    acetaminophen  500 MG tablet Commonly known as: TYLENOL  Take 2 tablets (1,000 mg total) by mouth every 8 (eight) hours as needed. What changed:  when to take this reasons to take this   albuterol  108 (90 Base) MCG/ACT inhaler Commonly known as: VENTOLIN  HFA INHALE 2 PUFFS INTO THE LUNGS EVERY 4-6 HOURS AS NEEDED FOR TIGHTNESS/WHEEZING What changed:  how much to take how to take this when to take this reasons to take this additional instructions   amiodarone  200 MG tablet Commonly known as: PACERONE  Take 1 tablet (200 mg total) by mouth 2 (two) times daily for 14 days, THEN 1 tablet (200 mg total) daily. Start taking on: February 19, 2024   atorvastatin  80 MG tablet Commonly known as: LIPITOR  TAKE 1 TABLET (80 MG TOTAL) BY MOUTH DAILY FOR  CHOLESTEROL   busPIRone  5 MG tablet Commonly known as: BUSPAR  Take 1 tablet (5 mg total) by mouth 2 (two) times daily. For anxiety   clopidogrel  75 MG tablet Commonly known as: PLAVIX  Take 1 tablet (75 mg total) by mouth daily.   cyanocobalamin  1000 MCG tablet Commonly known as: VITAMIN B12 Take 1,000 mcg by mouth daily.   escitalopram  20 MG tablet Commonly known as: LEXAPRO  TAKE 1 TABLET BY MOUTH DAILY FOR ANXIETY   lisinopril  20 MG tablet Commonly known as: ZESTRIL  Take 1 tablet (20 mg total) by mouth daily. for blood pressure. What changed:  medication strength how much to take   metoprolol  tartrate 50 MG tablet Commonly known as: LOPRESSOR  Take 1 tablet (50 mg total) by mouth 2 (two) times daily. What changed:  medication strength how much to take   nystatin  100000 UNIT/ML suspension Commonly known as: MYCOSTATIN  Take 5 mLs (500,000 Units total) by mouth 4 (four) times daily for 10 days.   oxyCODONE -acetaminophen  5-325 MG tablet Commonly known as: PERCOCET/ROXICET Take 1 tablet by mouth every 6 (six) hours as needed for moderate pain (pain score 4-6). What changed: when to take this   pantoprazole  40 MG tablet Commonly known as: Protonix  Take 1 tablet (40 mg total) by mouth 2 (two) times daily.   pyridoxine 100 MG tablet Commonly known as: B-6 Take 100 mg by mouth daily.   sodium chloride  1 g tablet TAKE 1 TABLET (1 G TOTAL) BY MOUTH 2 (TWO) TIMES DAILY WITH A MEAL. TO INCREASE SODIUM What changed: additional instructions   vitamin C 1000 MG tablet Take 2,000 mg by mouth daily.   vitamin E 180 MG (400 UNITS) capsule Take 400 Units by mouth daily.   Wixela Inhub 250-50 MCG/ACT Aepb Generic drug: fluticasone -salmeterol INHALE 1 PUFF INTO THE LUNGS IN THE MORNING AND AT BEDTIME. What changed: additional instructions        Follow-up Information     McGreal, Inocente HERO, MD. Schedule an appointment as soon as possible for a visit in 2 week(s).    Specialty: Gastroenterology Contact information: [address] [place], [address] [place] [address] [phone]         Gretta Lonni PARAS, MD. Schedule an appointment as soon as possible for a visit in 2 week(s).   Specialty: Vascular Surgery Contact information: [address] [place] [address] [phone]                No Known  Allergies  Discharge Exam: Vitals:   02/19/24 1059 02/19/24 1150  BP:  (!) 149/85  Pulse: 95 75  Resp:  19  Temp:  98.7 F (37.1 C)  SpO2:  93%    Physical Exam Vitals and nursing note reviewed.  Constitutional:      General: She is not in acute distress.    Appearance: She is normal weight. She is not toxic-appearing or diaphoretic.  HENT:     Nose: Nose normal.  Eyes:     General: No scleral icterus. Cardiovascular:     Rate and Rhythm: Normal rate. Rhythm irregular.  Pulmonary:     Effort: Pulmonary effort is normal.     Breath sounds: Normal breath sounds.  Abdominal:     General: Bowel sounds are normal. There is no distension.     Palpations: Abdomen is soft.  Musculoskeletal:     Right lower leg: No edema.     Left lower leg: No edema.  Skin:    General: Skin is warm and dry.     Capillary Refill: Capillary refill takes less than 2 seconds.  Neurological:     General: No focal deficit present.     Mental Status: She is alert and oriented to person, place, and time.     The results of significant diagnostics from this hospitalization (including imaging, microbiology, ancillary and laboratory) are listed below for reference.     Labs: BNP (last 3 results) Recent Labs    02/18/24 0014  BNP 256.1*   Basic Metabolic Panel: Recent Labs  Lab 02/13/24 0315 02/14/24 0338 02/16/24 1429 02/17/24 1544 02/17/24 2156 02/18/24 0436 02/19/24 0804  NA 130*   < > 130* 128* 131* 133* 134*  K 5.0   < > 4.1 3.9 3.7 3.7 4.0  CL 97*   < > 99 94* 99 99 101  CO2 24   < > 22 22 22 25 23   GLUCOSE 128*   < >  119* 107* 104* 106* 109*  BUN 8   < > 17 6* 5* 5* 6*  CREATININE 0.70   < > 0.72 0.55 0.46 0.56 0.55  CALCIUM  9.0   < > 9.3 8.8* 9.0 9.1 9.2  MG 2.4  --  1.8  --   --  2.0 1.9  PHOS  --   --   --   --   --  3.3  --    < > = values in this interval not displayed.   Liver Function Tests: Recent Labs  Lab 02/16/24 1429 02/17/24 1544 02/19/24 0804  AST 23 26 17   ALT 19 17 17   ALKPHOS 78 76 74  BILITOT 0.7 0.9 0.6  PROT 6.3* 6.3* 6.1*  ALBUMIN  3.5 3.5 3.5   No results for input(s): LIPASE, AMYLASE in the last 168 hours. No results for input(s): AMMONIA in the last 168 hours. CBC: Recent Labs  Lab 02/16/24 1429 02/17/24 1544 02/17/24 2156 02/18/24 0436 02/19/24 0804  WBC 12.4* 12.2* 12.2* 11.7* 9.8  NEUTROABS 9.9* 9.4* 8.5*  --  7.3  HGB 13.8 13.6 13.6 13.1 13.0  HCT 41.3 39.8 39.1 39.6 37.9  MCV 93.4 92.1 91.6 94.7 92.2  PLT 328 343 349 332 333   BNP: Recent Labs  Lab 02/18/24 0014  BNP 256.1*   Sepsis Labs Recent Labs  Lab 02/17/24 1544 02/17/24 2156 02/18/24 0436 02/19/24 0804  WBC 12.2* 12.2* 11.7* 9.8    Procedures/Studies: ECHOCARDIOGRAM LIMITED Result Date: 02/18/2024  ECHOCARDIOGRAM LIMITED REPORT   Patient Name:   LACHELLE RISSLER Date of Exam: 02/18/2024 Medical Rec #:  [id number]           Height:       63.0 in Accession #:    [id number]          Weight:       163.0 lb Date of Birth:  03/20/1961            BSA:          1.773 m Patient Age:    63 years            BP:           129/98 mmHg Patient Gender: F                   HR:           105 bpm. Exam Location:  Inpatient Procedure: 2D Echo, Limited Echo, Cardiac Doppler and Color Doppler (Both            Spectral and Color Flow Doppler were utilized during procedure). Indications:    Atrial Fibrillation  History:        Patient has prior history of Echocardiogram examinations, most                 recent 10/15/2023. Arrythmias:Atrial Fibrillation; Risk                 Factors:Hypertension,  Dyslipidemia and Former Smoker.  Sonographer:    Therisa Crouch Referring Phys: [id number] PRANAV M PATEL IMPRESSIONS  1. Left ventricular ejection fraction, by estimation, is 65 to 70%. The left ventricle has normal function. There is moderate concentric left ventricular hypertrophy. Left ventricular diastolic function could not be evaluated.  2. Right ventricular systolic function is normal. The right ventricular size is normal. There is moderately elevated pulmonary artery systolic pressure. The estimated right ventricular systolic pressure is 48.7 mmHg.  3. Left atrial size was moderately dilated.  4. The mitral valve is normal in structure. No evidence of mitral valve regurgitation. No evidence of mitral stenosis. Moderate mitral annular calcification.  5. The aortic valve is tricuspid. There is moderate calcification of the aortic valve. There is severe thickening of the aortic valve. Moderate to severe aortic valve stenosis. Aortic valve mean gradient measures 34.5 mmHg. Aortic valve Vmax measures 3.82 m/s.  6. The inferior vena cava is normal in size with greater than 50% respiratory variability, suggesting right atrial pressure of 3 mmHg. Comparison(s): Prior images reviewed side by side. The aortic stenosis is slightly worse. The rhythm is now atrial fibrillation with rapid ventricular response. FINDINGS  Left Ventricle: Left ventricular ejection fraction, by estimation, is 65 to 70%. The left ventricle has normal function. The left ventricular internal cavity size was normal in size. There is moderate concentric left ventricular hypertrophy. Left ventricular diastolic function could not be evaluated. Left ventricular diastolic function could not be evaluated due to atrial fibrillation. Right Ventricle: The right ventricular size is normal. No increase in right ventricular wall thickness. Right ventricular systolic function is normal. There is moderately elevated pulmonary artery systolic pressure. The  tricuspid regurgitant velocity is 3.38 m/s, and with an assumed right atrial pressure of 3 mmHg, the estimated right ventricular systolic pressure is 48.7 mmHg. Left Atrium: Left atrial size was moderately dilated. Right Atrium: Right atrial size was normal in size. Pericardium: There is no evidence of pericardial effusion. Mitral Valve: The mitral valve is  normal in structure. Moderate mitral annular calcification. No evidence of mitral valve stenosis. Tricuspid Valve: The tricuspid valve is normal in structure. Tricuspid valve regurgitation is mild. Aortic Valve: The aortic valve is tricuspid. There is moderate calcification of the aortic valve. There is severe thickening of the aortic valve. Moderate to severe aortic stenosis is present. Aortic valve mean gradient measures 34.5 mmHg. Aortic valve peak gradient measures 58.2 mmHg. Aortic valve area, by VTI measures 0.94 cm. Pulmonic Valve: The pulmonic valve was grossly normal. Pulmonic valve regurgitation is not visualized. No evidence of pulmonic stenosis. Aorta: The aortic root is normal in size and structure. Venous: The inferior vena cava is normal in size with greater than 50% respiratory variability, suggesting right atrial pressure of 3 mmHg. IAS/Shunts: No atrial level shunt detected by color flow Doppler. LEFT VENTRICLE PLAX 2D LVOT diam:     2.00 cm LV SV:         69 LV SV Index:   39 LVOT Area:     3.14 cm  LV Volumes (MOD) LV vol d, MOD A2C: 88.7 ml LV vol d, MOD A4C: 73.8 ml LV vol s, MOD A2C: 30.4 ml LV vol s, MOD A4C: 27.2 ml LV SV MOD A2C:     58.3 ml LV SV MOD A4C:     73.8 ml LV SV MOD BP:      53.4 ml RIGHT VENTRICLE         IVC TAPSE (M-mode): 2.3 cm  IVC diam: 1.60 cm LEFT ATRIUM             Index LA Vol (A2C):   62.4 ml 35.20 ml/m LA Vol (A4C):   61.1 ml 34.47 ml/m LA Biplane Vol: 61.6 ml 34.75 ml/m  AORTIC VALVE AV Area (Vmax):    0.98 cm AV Area (Vmean):   0.94 cm AV Area (VTI):     0.94 cm AV Vmax:           381.50 cm/s AV Vmean:           284.000 cm/s AV VTI:            0.732 m AV Peak Grad:      58.2 mmHg AV Mean Grad:      34.5 mmHg LVOT Vmax:         118.50 cm/s LVOT Vmean:        84.650 cm/s LVOT VTI:          0.220 m LVOT/AV VTI ratio: 0.30 TRICUSPID VALVE TR Peak grad:   45.7 mmHg TR Vmax:        338.00 cm/s  SHUNTS Systemic VTI:  0.22 m Systemic Diam: 2.00 cm Jerel Croitoru MD Electronically signed by Jerel Balding MD Signature Date/Time: 02/18/2024/4:35:21 PM    Final    CT Head Wo Contrast Result Date: 02/17/2024 CLINICAL DATA:  Headache, new onset, hypertension EXAM: CT HEAD WITHOUT CONTRAST TECHNIQUE: Contiguous axial images were obtained from the base of the skull through the vertex without intravenous contrast. RADIATION DOSE REDUCTION: This exam was performed according to the departmental dose-optimization program which includes automated exposure control, adjustment of the mA and/or kV according to patient size and/or use of iterative reconstruction technique. COMPARISON:  CT head 10/26/2023 FINDINGS: Brain: No intracranial hemorrhage, mass effect, or evidence of acute infarct. No hydrocephalus. No extra-axial fluid collection. Vascular: No hyperdense vessel or unexpected calcification. Skull: No fracture or focal lesion. Sinuses/Orbits: No acute finding. Other: None. IMPRESSION: No acute intracranial abnormality. Electronically  Signed   By: Norman Gatlin M.D.   On: 02/17/2024 23:58   DG C-Arm 1-60 Min Result Date: 02/12/2024 CLINICAL DATA:  Trans parotid artery revascularization. EXAM: DG C-ARM 1-60 MIN COMPARISON:  None Available. FINDINGS: Multiple fluoroscopic spot images provided. The total fluoroscopic time is 5 minutes, 19 seconds with cumulative air Karma 43.015 mGy. IMPRESSION: Fluoroscopic study as above. Electronically Signed   By: Vanetta Chou M.D.   On: 02/12/2024 11:40   VAS US  CAROTID Result Date: 01/29/2024 Carotid Arterial Duplex Study Patient Name:  Raimi Guillermo  Date of Exam:   01/29/2024  Medical Rec #: [id number]            Accession #:    [id number] Date of Birth: Mar 12, 1961             Patient Gender: F Patient Age:   41 years Exam Location:  [address] Procedure:      VAS US  CAROTID Referring Phys: LONNI GASKINS --------------------------------------------------------------------------------  Indications:                            Carotid artery disease. Patient was                                         scheduled for a carotid endarterectomy                                         on the right earlier this year, which                                         was canceled after she had trauma with                                         a mechanical fall and broke multiple                                         ribs. She was having blurry vision in                                         her right eye April/May 2024.                                         High-grade stenosis known from previous                                         exam as well as CT angio. Risk Factors:                           Hypertension,  hyperlipidemia, past                                         history of smoking, coronary artery                                         disease. Comparison Study:                       Previous carotid duplex 04/25/23 at                                         [facility] showed RICA velocities of                                         101/39 cm/sec and LICA velocities of                                         541/274 cm/sec. CT angio of the neck                                         06/20/23 showed RICA near occlusion and                                         LICA high-grade stenosis. Pre-Surgical Evaluation & Surgical      Stenosis at left ICA only. ICA is Correlation                             normal past the stenosis. Anatomy on                                         the left is within normal limits.Left                                         bifurcation is located near  the Hyoid                                         Notch. Right distal CCA and ICA now                                         appear occluded. Performing Technologist: Edsel Mustard RVT  Examination Guidelines: A complete evaluation includes B-mode imaging, spectral Doppler, color Doppler, and power Doppler as needed  of all accessible portions of each vessel. Bilateral testing is considered an integral part of a complete examination. Limited examinations for reoccurring indications may be performed as noted.  Right Carotid Findings: +----------+--------+--------+--------+------------------+-------------------+           PSV cm/sEDV cm/sStenosisPlaque DescriptionComments            +----------+--------+--------+--------+------------------+-------------------+ CCA Prox  37      37      >50%    diffuse and smoothHigh resistant flow +----------+--------+--------+--------+------------------+-------------------+ CCA Mid                           diffuse and smoothHigh resistant flow +----------+--------+--------+--------+------------------+-------------------+ CCA Distal0       0       Occluded                                      +----------+--------+--------+--------+------------------+-------------------+ ICA Prox  0       0       Occluded                                      +----------+--------+--------+--------+------------------+-------------------+ ICA Mid   0       0       Occluded                                      +----------+--------+--------+--------+------------------+-------------------+ ICA Distal0       0       Occluded                                      +----------+--------+--------+--------+------------------+-------------------+ ECA       33      12                                dampened monophasic +----------+--------+--------+--------+------------------+-------------------+  +----------+--------+-------+----------------+-------------------+           PSV cm/sEDV cmsDescribe        Arm Pressure (mmHG) +----------+--------+-------+----------------+-------------------+ [id number]            Multiphasic, [id number]                 +----------+--------+-------+----------------+-------------------+ +---------+--------+--+--------+--+---------+ VertebralPSV cm/s94EDV cm/s29Antegrade +---------+--------+--+--------+--+---------+  Left Carotid Findings: +----------+--------+--------+--------+------------------------+-------------+           PSV cm/sEDV cm/sStenosisPlaque Description      Comments      +----------+--------+--------+--------+------------------------+-------------+ CCA Prox  61      18                                                    +----------+--------+--------+--------+------------------------+-------------+ CCA Distal52      19                                                    +----------+--------+--------+--------+------------------------+-------------+  ICA Prox  506     293     80-99%  diffuse and heterogenous              +----------+--------+--------+--------+------------------------+-------------+ ICA Mid   155     56                                                    +----------+--------+--------+--------+------------------------+-------------+ ICA Distal45      20                                      tardus parvus +----------+--------+--------+--------+------------------------+-------------+ ECA       264     50      >50%                                          +----------+--------+--------+--------+------------------------+-------------+ +----------+--------+--------+----------------+-------------------+           PSV cm/sEDV cm/sDescribe        Arm Pressure (mmHG) +----------+--------+--------+----------------+-------------------+ [id number]             Multiphasic, [id number]                  +----------+--------+--------+----------------+-------------------+ +---------+--------+--+--------+--+---------+ VertebralPSV cm/s45EDV cm/s17Antegrade +---------+--------+--+--------+--+---------+    Findings reported to Dr. Gretta via staff message and surgical scheduler Alan at 9:30 am . Summary: Right Carotid: Evidence consistent with a total occlusion of the right ICA. The                distal CCA appears occluded. Left Carotid: Velocities in the left ICA are consistent with a 80-99% stenosis.               The ECA appears >50% stenosed. Vertebrals:  Bilateral vertebral arteries demonstrate antegrade flow. Subclavians: Normal flow hemodynamics were seen in bilateral subclavian              arteries. *See table(s) above for measurements and observations.  Electronically signed by Penne Colorado MD on 01/29/2024 at 1:12:24 PM.    Final     Time coordinating discharge: 55 mins  SIGNED:  Camellia Door, DO [facility] 02/19/24, 4:15 PM

## 2024-02-19 NOTE — Progress Notes (Signed)
 PROGRESS NOTE    Denise [name]  FMW:[id number] DOB: Oct 22, 1960 DOA: 02/17/2024 PCP: Gretta Comer POUR, NP  Subjective: Pt seen and examined. Orders noted for pt to stop IV amiodarone  and change to po amiodarone . Verified with cards attending that pt can be discharged to home on oral amio.  Awaiting GI attending to see patient and decide if pt needs to proceed with colonoscopy/EGD.  Pt now only on plavix  75 mg daily. ASA and eliquis  on hold.   Hospital Course: HPI: Denise [name] is a 63 y.o. female with medical history significant for carotid artery stenosis status post left transcarotid artery revascularization on 02/12/2024, on triple therapy Eliquis /aspirin /Plavix  x 1 month, postoperative atrial fibrillation, chronic anxiety, COPD, history of GI bleed, AVM in the cecum, external hemorrhoids, who presents to the ER due to feeling unwell.  She checked her blood pressure and it was elevated.  She decided to come to the ER for further evaluation.   Additionally, the patient endorses painless lower GI bleed.  She noticed bright red blood around her stools.  It started on Saturday, 4 days ago.   In the ER, found to be in A-fib with RVR.  Cardizem  drip initiated in the ED for rate control.  Seen by cardiology.  Due to ongoing intermittent lower GI bleeding, GI was consulted by EDP.  TRH, hospitalist service, was asked to admit.   ED Course: Temperature 97.9.  BP 108/94.  Pulse 73, respiratory 17, O2 saturation 97% on room air.  Lab studies notable for WBC 12.2.  Serum sodium 131, glucose 104, BNP 256.  Significant Events: Admitted 02/17/2024 for aflutter with RVR 02-18-2024 developed rectal bleeding. GI consulted.  Admission Labs: WBC 12.2, HgB 13.6, Plt 343 Na 128, K 3.9, CO2 of 22, BUN 6, Scr 0.55, glu 107  Admission Imaging Studies: CT head No acute intracranial abnormality   Significant Labs:   Significant Imaging Studies: Echo LVEF 65%. Moderate/severe AS. Mean gradient  34 mmHg,   Antibiotic Therapy: Anti-infectives (From admission, onward)    None       Procedures:   Consultants: Cardiology Vascular surgery GI    Assessment and Plan: * Atrial flutter with rapid ventricular response (HCC) Prior to 02-19-2024 Was on Cardizem  drip.  That is currently discontinued. Patient's home metoprolol  was not resumed by the admitting provider, I have resumed that medication. Resumed other medication including iron, Pepcid , Lexapro , Lipitor , Plavix . Limited echocardiogram is also requested given her ongoing tachycardia and frequent PVCs.   Discussed with cardiology.  Patient was started on amiodarone  drip for better rate control. Okay to stop anticoagulation for now while the bleeding workup is underway.  02-19-2024 cards has changed IV amio over to po amio. Pt has f/u with cards on 02-27-2024. Stay off eliquis  and asa for now. Only plavix  for now.  GI bleed Prior to 02-19-2024 Discussed with GI Okay to continue Plavix  from their perspective. Patient reports bleeding although her hemoglobin has been stable around 13. She does have significant history of GI bleed with hemoglobin drop in the liver down to 3 in the past with AVM. GI currently want to hold off on any intervention until her A-fib is under control.  02-19-2024 HgB is stable at 13 g/dl. No significant blood loss. GI approved full liquid diet today. Not sure if they are going to scope her or not.   Asymptomatic carotid artery stenosis without infarction, left Prior to 02-19-2024  status post left TCAR 7/9. Requested vascular surgery consult with  regards to her antiplatelet medications. They would like to continue at least 1 antiplatelet medication and preferably Plavix  in that situation.  02-19-2024 pt to stay on plavix  75 mg daily for now. Holding on ASA and Eliquis  until she can f/u with cards and vascular surgery as outpatient.  Aortic stenosis 02-19-2024 echo shows mod/severe AS.   Moderate/severe AS. Mean gradient 34 mmHg,   SIADH (syndrome of inappropriate ADH production) (HCC) Prior to 02-19-2024 Patient is on home salt tablet although for now I am going to hold off and monitor.  02-19-2024 Na stable at 134   AVM (arteriovenous malformation) of colon Prior to 02-19-2024 prior hx of colonoscopy May 2021 with finding of a single AVM in the cecum which was treated with APC   02-19-2024 HgB is stable for now. Awaiting to see if GI wants to scope her again.  COPD (chronic obstructive pulmonary disease) (HCC) Prior to 02-19-2024 stable.  02-19-2024 stable.   DVT prophylaxis: Place and maintain sequential compression device Start: 02/18/24 1303    Code Status: Full Code Family Communication: pt is decisional. No family at bedside Disposition Plan: return home Reason for continuing need for hospitalization: awaiting for GI clearance for Discharge to home. Medically stable otherwise.  Objective: Vitals:   02/19/24 0755 02/19/24 0800 02/19/24 1059 02/19/24 1150  BP:  (!) 140/76  (!) 149/85  Pulse:  77 95 75  Resp:  14  19  Temp:    98.7 F (37.1 C)  TempSrc:    Oral  SpO2: 92% 99%  93%  Weight:      Height:        Intake/Output Summary (Last 24 hours) at 02/19/2024 1254 Last data filed at 02/19/2024 0436 Gross per 24 hour  Intake 395.16 ml  Output 700 ml  Net -304.84 ml   Filed Weights   02/19/24 0418  Weight: 72.2 kg    Examination:  Physical Exam Vitals and nursing note reviewed.  Constitutional:      General: She is not in acute distress.    Appearance: She is normal weight. She is not toxic-appearing or diaphoretic.  HENT:     Nose: Nose normal.  Eyes:     General: No scleral icterus. Cardiovascular:     Rate and Rhythm: Normal rate. Rhythm irregular.  Pulmonary:     Effort: Pulmonary effort is normal.     Breath sounds: Normal breath sounds.  Abdominal:     General: Bowel sounds are normal. There is no distension.     Palpations:  Abdomen is soft.  Musculoskeletal:     Right lower leg: No edema.     Left lower leg: No edema.  Skin:    General: Skin is warm and dry.     Capillary Refill: Capillary refill takes less than 2 seconds.  Neurological:     General: No focal deficit present.     Mental Status: She is alert and oriented to person, place, and time.     Data Reviewed: I have personally reviewed following labs and imaging studies  CBC: Recent Labs  Lab 02/16/24 1429 02/17/24 1544 02/17/24 2156 02/18/24 0436 02/19/24 0804  WBC 12.4* 12.2* 12.2* 11.7* 9.8  NEUTROABS 9.9* 9.4* 8.5*  --  7.3  HGB 13.8 13.6 13.6 13.1 13.0  HCT 41.3 39.8 39.1 39.6 37.9  MCV 93.4 92.1 91.6 94.7 92.2  PLT 328 343 349 332 333   Basic Metabolic Panel: Recent Labs  Lab 02/12/24 1438 02/13/24 0315 02/14/24 0338 02/16/24  1429 02/17/24 1544 02/17/24 2156 02/18/24 0436 02/19/24 0804  NA 133* 130*   < > 130* 128* 131* 133* 134*  K 4.3 5.0   < > 4.1 3.9 3.7 3.7 4.0  CL 101 97*   < > 99 94* 99 99 101  CO2 21* 24   < > 22 22 22 25 23   GLUCOSE 132* 128*   < > 119* 107* 104* 106* 109*  BUN 8 8   < > 17 6* 5* 5* 6*  CREATININE 0.72 0.70   < > 0.72 0.55 0.46 0.56 0.55  CALCIUM  8.6* 9.0   < > 9.3 8.8* 9.0 9.1 9.2  MG 1.7 2.4  --  1.8  --   --  2.0 1.9  PHOS  --   --   --   --   --   --  3.3  --    < > = values in this interval not displayed.   GFR: Estimated Creatinine Clearance: 69.4 mL/min (by C-G formula based on SCr of 0.55 mg/dL). Liver Function Tests: Recent Labs  Lab 02/16/24 1429 02/17/24 1544 02/19/24 0804  AST 23 26 17   ALT 19 17 17   ALKPHOS 78 76 74  BILITOT 0.7 0.9 0.6  PROT 6.3* 6.3* 6.1*  ALBUMIN  3.5 3.5 3.5   Coagulation Profile: Recent Labs  Lab 02/18/24 0014  INR 1.0   BNP (last 3 results) Recent Labs    02/18/24 0014  BNP 256.1*   Radiology Studies: ECHOCARDIOGRAM LIMITED Result Date: 02/18/2024    ECHOCARDIOGRAM LIMITED REPORT   Patient Name:   Denise [name] Date of Exam:  02/18/2024 Medical Rec #:  [id number]           Height:       63.0 in Accession #:    [id number]          Weight:       163.0 lb Date of Birth:  1960-09-21            BSA:          1.773 m Patient Age:    63 years            BP:           129/98 mmHg Patient Gender: F                   HR:           105 bpm. Exam Location:  Inpatient Procedure: 2D Echo, Limited Echo, Cardiac Doppler and Color Doppler (Both            Spectral and Color Flow Doppler were utilized during procedure). Indications:    Atrial Fibrillation  History:        Patient has prior history of Echocardiogram examinations, most                 recent 10/15/2023. Arrythmias:Atrial Fibrillation; Risk                 Factors:Hypertension, Dyslipidemia and Former Smoker.  Sonographer:    Therisa Crouch Referring Phys: [id number] PRANAV M PATEL IMPRESSIONS  1. Left ventricular ejection fraction, by estimation, is 65 to 70%. The left ventricle has normal function. There is moderate concentric left ventricular hypertrophy. Left ventricular diastolic function could not be evaluated.  2. Right ventricular systolic function is normal. The right ventricular size is normal. There is moderately elevated pulmonary artery systolic pressure. The estimated right ventricular systolic pressure is 48.7  mmHg.  3. Left atrial size was moderately dilated.  4. The mitral valve is normal in structure. No evidence of mitral valve regurgitation. No evidence of mitral stenosis. Moderate mitral annular calcification.  5. The aortic valve is tricuspid. There is moderate calcification of the aortic valve. There is severe thickening of the aortic valve. Moderate to severe aortic valve stenosis. Aortic valve mean gradient measures 34.5 mmHg. Aortic valve Vmax measures 3.82 m/s.  6. The inferior vena cava is normal in size with greater than 50% respiratory variability, suggesting right atrial pressure of 3 mmHg. Comparison(s): Prior images reviewed side by side. The aortic stenosis is  slightly worse. The rhythm is now atrial fibrillation with rapid ventricular response. FINDINGS  Left Ventricle: Left ventricular ejection fraction, by estimation, is 65 to 70%. The left ventricle has normal function. The left ventricular internal cavity size was normal in size. There is moderate concentric left ventricular hypertrophy. Left ventricular diastolic function could not be evaluated. Left ventricular diastolic function could not be evaluated due to atrial fibrillation. Right Ventricle: The right ventricular size is normal. No increase in right ventricular wall thickness. Right ventricular systolic function is normal. There is moderately elevated pulmonary artery systolic pressure. The tricuspid regurgitant velocity is 3.38 m/s, and with an assumed right atrial pressure of 3 mmHg, the estimated right ventricular systolic pressure is 48.7 mmHg. Left Atrium: Left atrial size was moderately dilated. Right Atrium: Right atrial size was normal in size. Pericardium: There is no evidence of pericardial effusion. Mitral Valve: The mitral valve is normal in structure. Moderate mitral annular calcification. No evidence of mitral valve stenosis. Tricuspid Valve: The tricuspid valve is normal in structure. Tricuspid valve regurgitation is mild. Aortic Valve: The aortic valve is tricuspid. There is moderate calcification of the aortic valve. There is severe thickening of the aortic valve. Moderate to severe aortic stenosis is present. Aortic valve mean gradient measures 34.5 mmHg. Aortic valve peak gradient measures 58.2 mmHg. Aortic valve area, by VTI measures 0.94 cm. Pulmonic Valve: The pulmonic valve was grossly normal. Pulmonic valve regurgitation is not visualized. No evidence of pulmonic stenosis. Aorta: The aortic root is normal in size and structure. Venous: The inferior vena cava is normal in size with greater than 50% respiratory variability, suggesting right atrial pressure of 3 mmHg. IAS/Shunts: No  atrial level shunt detected by color flow Doppler. LEFT VENTRICLE PLAX 2D LVOT diam:     2.00 cm LV SV:         69 LV SV Index:   39 LVOT Area:     3.14 cm  LV Volumes (MOD) LV vol d, MOD A2C: 88.7 ml LV vol d, MOD A4C: 73.8 ml LV vol s, MOD A2C: 30.4 ml LV vol s, MOD A4C: 27.2 ml LV SV MOD A2C:     58.3 ml LV SV MOD A4C:     73.8 ml LV SV MOD BP:      53.4 ml RIGHT VENTRICLE         IVC TAPSE (M-mode): 2.3 cm  IVC diam: 1.60 cm LEFT ATRIUM             Index LA Vol (A2C):   62.4 ml 35.20 ml/m LA Vol (A4C):   61.1 ml 34.47 ml/m LA Biplane Vol: 61.6 ml 34.75 ml/m  AORTIC VALVE AV Area (Vmax):    0.98 cm AV Area (Vmean):   0.94 cm AV Area (VTI):     0.94 cm AV Vmax:  381.50 cm/s AV Vmean:          284.000 cm/s AV VTI:            0.732 m AV Peak Grad:      58.2 mmHg AV Mean Grad:      34.5 mmHg LVOT Vmax:         118.50 cm/s LVOT Vmean:        84.650 cm/s LVOT VTI:          0.220 m LVOT/AV VTI ratio: 0.30 TRICUSPID VALVE TR Peak grad:   45.7 mmHg TR Vmax:        338.00 cm/s  SHUNTS Systemic VTI:  0.22 m Systemic Diam: 2.00 cm Jerel Croitoru MD Electronically signed by Jerel Balding MD Signature Date/Time: 02/18/2024/4:35:21 PM    Final    CT Head Wo Contrast Result Date: 02/17/2024 CLINICAL DATA:  Headache, new onset, hypertension EXAM: CT HEAD WITHOUT CONTRAST TECHNIQUE: Contiguous axial images were obtained from the base of the skull through the vertex without intravenous contrast. RADIATION DOSE REDUCTION: This exam was performed according to the departmental dose-optimization program which includes automated exposure control, adjustment of the mA and/or kV according to patient size and/or use of iterative reconstruction technique. COMPARISON:  CT head 10/26/2023 FINDINGS: Brain: No intracranial hemorrhage, mass effect, or evidence of acute infarct. No hydrocephalus. No extra-axial fluid collection. Vascular: No hyperdense vessel or unexpected calcification. Skull: No fracture or focal lesion.  Sinuses/Orbits: No acute finding. Other: None. IMPRESSION: No acute intracranial abnormality. Electronically Signed   By: Norman Gatlin M.D.   On: 02/17/2024 23:58    Scheduled Meds:  amiodarone   200 mg Oral BID   [START ON 03/04/2024] amiodarone   200 mg Oral Daily   atorvastatin   80 mg Oral Daily   busPIRone   5 mg Oral BID   clopidogrel   75 mg Oral Daily   cyanocobalamin   1,000 mcg Oral Daily   escitalopram   20 mg Oral Daily   famotidine   20 mg Oral Daily   ferrous sulfate   650 mg Oral Q breakfast   levalbuterol   0.63 mg Nebulization BID   lisinopril   20 mg Oral Daily   metoprolol  tartrate  50 mg Oral BID   nystatin   5 mL Oral QID   pantoprazole  (PROTONIX ) IV  40 mg Intravenous BID   Continuous Infusions:  amiodarone  30 mg/hr (02/19/24 0436)     LOS: 1 day   Time spent: 55 minutes  Camellia Door, DO  [facility]  02/19/2024, 12:54 PM

## 2024-02-19 NOTE — Plan of Care (Signed)

## 2024-02-19 NOTE — Subjective & Objective (Signed)
 Pt seen and examined. Orders noted for pt to stop IV amiodarone  and change to po amiodarone . Verified with cards attending that pt can be discharged to home on oral amio.  Awaiting GI attending to see patient and decide if pt needs to proceed with colonoscopy/EGD.  Pt now only on plavix  75 mg daily. ASA and eliquis  on hold.

## 2024-02-19 NOTE — Hospital Course (Addendum)
 HPI: Denise [name] is a 63 y.o. female with medical history significant for carotid artery stenosis status post left transcarotid artery revascularization on 02/12/2024, on triple therapy Eliquis /aspirin /Plavix  x 1 month, postoperative atrial fibrillation, chronic anxiety, COPD, history of GI bleed, AVM in the cecum, external hemorrhoids, who presents to the ER due to feeling unwell.  She checked her blood pressure and it was elevated.  She decided to come to the ER for further evaluation.   Additionally, the patient endorses painless lower GI bleed.  She noticed bright red blood around her stools.  It started on Saturday, 4 days ago.   In the ER, found to be in A-fib with RVR.  Cardizem  drip initiated in the ED for rate control.  Seen by cardiology.  Due to ongoing intermittent lower GI bleeding, GI was consulted by EDP.  TRH, hospitalist service, was asked to admit.   ED Course: Temperature 97.9.  BP 108/94.  Pulse 73, respiratory 17, O2 saturation 97% on room air.  Lab studies notable for WBC 12.2.  Serum sodium 131, glucose 104, BNP 256.  Significant Events: Admitted 02/17/2024 for aflutter with RVR 02-18-2024 developed rectal bleeding. GI consulted.  Admission Labs: WBC 12.2, HgB 13.6, Plt 343 Na 128, K 3.9, CO2 of 22, BUN 6, Scr 0.55, glu 107  Admission Imaging Studies: CT head No acute intracranial abnormality   Significant Labs:   Significant Imaging Studies: Echo LVEF 65%. Moderate/severe AS. Mean gradient 34 mmHg,   Antibiotic Therapy: Anti-infectives (From admission, onward)    None       Procedures:   Consultants: Cardiology Vascular surgery GI

## 2024-02-19 NOTE — Plan of Care (Signed)
  Problem: Education: Goal: Knowledge of General Education information will improve Description: Including pain rating scale, medication(s)/side effects and non-pharmacologic comfort measures Outcome: Progressing   Problem: Health Behavior/Discharge Planning: Goal: Ability to manage health-related needs will improve Outcome: Progressing   Problem: Clinical Measurements: Goal: Ability to maintain clinical measurements within normal limits will improve Outcome: Progressing Goal: Diagnostic test results will improve Outcome: Progressing Goal: Respiratory complications will improve Outcome: Progressing Goal: Cardiovascular complication will be avoided Outcome: Progressing   Problem: Elimination: Goal: Will not experience complications related to bowel motility Outcome: Progressing Goal: Will not experience complications related to urinary retention Outcome: Progressing   Problem: Pain Managment: Goal: General experience of comfort will improve and/or be controlled Outcome: Progressing

## 2024-02-19 NOTE — Assessment & Plan Note (Addendum)
 Prior to 02-19-2024 Discussed with GI Okay to continue Plavix  from their perspective. Patient reports bleeding although her hemoglobin has been stable around 13. She does have significant history of GI bleed with hemoglobin drop in the liver down to 3 in the past with AVM. GI currently want to hold off on any intervention until her A-fib is under control.  02-19-2024 HgB is stable at 13 g/dl. No significant blood loss. GI approved full liquid diet today. Not sure if they are going to scope her or not.

## 2024-02-19 NOTE — Progress Notes (Signed)
 Rounding Note   Patient Name: Denise [name] Date of Encounter: 02/19/2024  [facility] Cardiologist: Shelda Bruckner, MD   Subjective Feeling well.  No CP/SOB.  No palpitations.  No bleeding this AM.   Scheduled Meds:  atorvastatin   80 mg Oral Daily   busPIRone   5 mg Oral BID   clopidogrel   75 mg Oral Daily   cyanocobalamin   1,000 mcg Oral Daily   escitalopram   20 mg Oral Daily   famotidine   20 mg Oral Daily   ferrous sulfate   650 mg Oral Q breakfast   levalbuterol   0.63 mg Nebulization BID   metoprolol  tartrate  37.5 mg Oral BID   nystatin   5 mL Oral QID   pantoprazole  (PROTONIX ) IV  40 mg Intravenous BID   Continuous Infusions:  amiodarone  30 mg/hr (02/19/24 0436)   PRN Meds: acetaminophen , LORazepam , magic mouthwash, oxyCODONE -acetaminophen , polyethylene glycol, prochlorperazine    Vital Signs  Vitals:   02/19/24 0418 02/19/24 0731 02/19/24 0755 02/19/24 0800  BP: 132/78 (!) 143/81  (!) 140/76  Pulse: 97 76  77  Resp: 20 18  14   Temp: 98.2 F (36.8 C) 98 F (36.7 C)    TempSrc: Oral Oral    SpO2: 92% 92% 92% 99%  Weight: 72.2 kg     Height: 5' 3 (1.6 m)       Intake/Output Summary (Last 24 hours) at 02/19/2024 0943 Last data filed at 02/19/2024 0436 Gross per 24 hour  Intake 395.16 ml  Output 700 ml  Net -304.84 ml      02/19/2024    4:18 AM 02/17/2024    3:29 PM 02/12/2024    6:32 AM  Last 3 Weights  Weight (lbs) 159 lb 2.8 oz 163 lb 163 lb  Weight (kg) 72.2 kg 73.936 kg 73.936 kg      Telemetry Back in atrial fibrillation with rvR.  - Personally Reviewed  ECG  Sinus rhythm.  Rate 73 bpm.   - Personally Reviewed  Physical Exam  VS:  BP (!) 140/76   Pulse 77   Temp 98 F (36.7 C) (Oral)   Resp 14   Ht 5' 3 (1.6 m)   Wt 72.2 kg   SpO2 99%   BMI 28.20 kg/m  , BMI Body mass index is 28.2 kg/m. GENERAL: No acute distress.  HEENT: Pupils equal round and reactive, fundi not visualized, oral mucosa unremarkable NECK:   No jugular venous distention, waveform within normal limits, carotid upstroke brisk and symmetric, no bruits, no thyromegaly LUNGS:  Clear to auscultation bilaterally HEART:  Tachycardic.  Irregularly irregular.  PMI not displaced or sustained,S1 and S2 within normal limits, no S3, no S4, no clicks, no rubs, II/VI systolic murmur ABD:  Flat, positive bowel sounds normal in frequency in pitch, no bruits, no rebound, no guarding, no midline pulsatile mass, no hepatomegaly, no splenomegaly EXT:  2 plus pulses throughout, no edema, no cyanosis no clubbing SKIN:  No rashes no nodules NEURO:  Cranial nerves II through XII grossly intact, motor grossly intact throughout PSYCH:  Cognitively intact, oriented to person place and time   Labs High Sensitivity Troponin:   Recent Labs  Lab 02/17/24 1544 02/17/24 1744  TROPONINIHS 7 7     Chemistry Recent Labs  Lab 02/16/24 1429 02/17/24 1544 02/17/24 2156 02/18/24 0436 02/19/24 0804  NA 130* 128* 131* 133* 134*  K 4.1 3.9 3.7 3.7 4.0  CL 99 94* 99 99 101  CO2 22 22 22 25  23  GLUCOSE 119* 107* 104* 106* 109*  BUN 17 6* 5* 5* 6*  CREATININE 0.72 0.55 0.46 0.56 0.55  CALCIUM  9.3 8.8* 9.0 9.1 9.2  MG 1.8  --   --  2.0 1.9  PROT 6.3* 6.3*  --   --  6.1*  ALBUMIN  3.5 3.5  --   --  3.5  AST 23 26  --   --  17  ALT 19 17  --   --  17  ALKPHOS 78 76  --   --  74  BILITOT 0.7 0.9  --   --  0.6  GFRNONAA >60 >60 >60 >60 >60  ANIONGAP 9 12 10 9 10     Lipids  Recent Labs  Lab 02/13/24 0315  CHOL 137  TRIG 70  HDL 56  LDLCALC 67  CHOLHDL 2.4    Hematology Recent Labs  Lab 02/17/24 2156 02/18/24 0436 02/19/24 0804  WBC 12.2* 11.7* 9.8  RBC 4.27 4.18 4.11  HGB 13.6 13.1 13.0  HCT 39.1 39.6 37.9  MCV 91.6 94.7 92.2  MCH 31.9 31.3 31.6  MCHC 34.8 33.1 34.3  RDW 19.9* 20.0* 20.0*  PLT 349 332 333   Thyroid  Recent Labs  Lab 02/12/24 1438 02/13/24 0315  TSH 0.340*  --   FREET4  --  1.01    BNP Recent Labs  Lab  02/18/24 0014  BNP 256.1*    DDimer No results for input(s): DDIMER in the last 168 hours.   Radiology  ECHOCARDIOGRAM LIMITED Result Date: 02/18/2024    ECHOCARDIOGRAM LIMITED REPORT   Patient Name:   Denise [name] [facility] Date of Exam: 02/18/2024 Medical Rec #:  [id number]           Height:       63.0 in Accession #:    [id number]          Weight:       163.0 lb Date of Birth:  1960-11-01            BSA:          1.773 m Patient Age:    63 years            BP:           129/98 mmHg Patient Gender: F                   HR:           105 bpm. Exam Location:  Inpatient Procedure: 2D Echo, Limited Echo, Cardiac Doppler and Color Doppler (Both            Spectral and Color Flow Doppler were utilized during procedure). Indications:    Atrial Fibrillation  History:        Patient has prior history of Echocardiogram examinations, most                 recent 10/15/2023. Arrythmias:Atrial Fibrillation; Risk                 Factors:Hypertension, Dyslipidemia and Former Smoker.  Sonographer:    Therisa Crouch Referring Phys: [id number] PRANAV M PATEL IMPRESSIONS  1. Left ventricular ejection fraction, by estimation, is 65 to 70%. The left ventricle has normal function. There is moderate concentric left ventricular hypertrophy. Left ventricular diastolic function could not be evaluated.  2. Right ventricular systolic function is normal. The right ventricular size is normal. There is moderately elevated pulmonary artery systolic pressure. The estimated right ventricular systolic pressure is  48.7 mmHg.  3. Left atrial size was moderately dilated.  4. The mitral valve is normal in structure. No evidence of mitral valve regurgitation. No evidence of mitral stenosis. Moderate mitral annular calcification.  5. The aortic valve is tricuspid. There is moderate calcification of the aortic valve. There is severe thickening of the aortic valve. Moderate to severe aortic valve stenosis. Aortic valve mean gradient measures 34.5 mmHg. Aortic  valve Vmax measures 3.82 m/s.  6. The inferior vena cava is normal in size with greater than 50% respiratory variability, suggesting right atrial pressure of 3 mmHg. Comparison(s): Prior images reviewed side by side. The aortic stenosis is slightly worse. The rhythm is now atrial fibrillation with rapid ventricular response. FINDINGS  Left Ventricle: Left ventricular ejection fraction, by estimation, is 65 to 70%. The left ventricle has normal function. The left ventricular internal cavity size was normal in size. There is moderate concentric left ventricular hypertrophy. Left ventricular diastolic function could not be evaluated. Left ventricular diastolic function could not be evaluated due to atrial fibrillation. Right Ventricle: The right ventricular size is normal. No increase in right ventricular wall thickness. Right ventricular systolic function is normal. There is moderately elevated pulmonary artery systolic pressure. The tricuspid regurgitant velocity is 3.38 m/s, and with an assumed right atrial pressure of 3 mmHg, the estimated right ventricular systolic pressure is 48.7 mmHg. Left Atrium: Left atrial size was moderately dilated. Right Atrium: Right atrial size was normal in size. Pericardium: There is no evidence of pericardial effusion. Mitral Valve: The mitral valve is normal in structure. Moderate mitral annular calcification. No evidence of mitral valve stenosis. Tricuspid Valve: The tricuspid valve is normal in structure. Tricuspid valve regurgitation is mild. Aortic Valve: The aortic valve is tricuspid. There is moderate calcification of the aortic valve. There is severe thickening of the aortic valve. Moderate to severe aortic stenosis is present. Aortic valve mean gradient measures 34.5 mmHg. Aortic valve peak gradient measures 58.2 mmHg. Aortic valve area, by VTI measures 0.94 cm. Pulmonic Valve: The pulmonic valve was grossly normal. Pulmonic valve regurgitation is not visualized. No  evidence of pulmonic stenosis. Aorta: The aortic root is normal in size and structure. Venous: The inferior vena cava is normal in size with greater than 50% respiratory variability, suggesting right atrial pressure of 3 mmHg. IAS/Shunts: No atrial level shunt detected by color flow Doppler. LEFT VENTRICLE PLAX 2D LVOT diam:     2.00 cm LV SV:         69 LV SV Index:   39 LVOT Area:     3.14 cm  LV Volumes (MOD) LV vol d, MOD A2C: 88.7 ml LV vol d, MOD A4C: 73.8 ml LV vol s, MOD A2C: 30.4 ml LV vol s, MOD A4C: 27.2 ml LV SV MOD A2C:     58.3 ml LV SV MOD A4C:     73.8 ml LV SV MOD BP:      53.4 ml RIGHT VENTRICLE         IVC TAPSE (M-mode): 2.3 cm  IVC diam: 1.60 cm LEFT ATRIUM             Index LA Vol (A2C):   62.4 ml 35.20 ml/m LA Vol (A4C):   61.1 ml 34.47 ml/m LA Biplane Vol: 61.6 ml 34.75 ml/m  AORTIC VALVE AV Area (Vmax):    0.98 cm AV Area (Vmean):   0.94 cm AV Area (VTI):     0.94 cm AV Vmax:  381.50 cm/s AV Vmean:          284.000 cm/s AV VTI:            0.732 m AV Peak Grad:      58.2 mmHg AV Mean Grad:      34.5 mmHg LVOT Vmax:         118.50 cm/s LVOT Vmean:        84.650 cm/s LVOT VTI:          0.220 m LVOT/AV VTI ratio: 0.30 TRICUSPID VALVE TR Peak grad:   45.7 mmHg TR Vmax:        338.00 cm/s  SHUNTS Systemic VTI:  0.22 m Systemic Diam: 2.00 cm Jerel Croitoru MD Electronically signed by Jerel Balding MD Signature Date/Time: 02/18/2024/4:35:21 PM    Final    CT Head Wo Contrast Result Date: 02/17/2024 CLINICAL DATA:  Headache, new onset, hypertension EXAM: CT HEAD WITHOUT CONTRAST TECHNIQUE: Contiguous axial images were obtained from the base of the skull through the vertex without intravenous contrast. RADIATION DOSE REDUCTION: This exam was performed according to the departmental dose-optimization program which includes automated exposure control, adjustment of the mA and/or kV according to patient size and/or use of iterative reconstruction technique. COMPARISON:  CT head  10/26/2023 FINDINGS: Brain: No intracranial hemorrhage, mass effect, or evidence of acute infarct. No hydrocephalus. No extra-axial fluid collection. Vascular: No hyperdense vessel or unexpected calcification. Skull: No fracture or focal lesion. Sinuses/Orbits: No acute finding. Other: None. IMPRESSION: No acute intracranial abnormality. Electronically Signed   By: Norman Gatlin M.D.   On: 02/17/2024 23:58   Cardiac Studies  Echo 10/15/23:  1. Left ventricular ejection fraction, by estimation, is 70 to 75%. The  left ventricle has hyperdynamic function. The left ventricle has no  regional wall motion abnormalities. There is moderate concentric left  ventricular hypertrophy. Left ventricular  diastolic parameters are consistent with Grade I diastolic dysfunction  (impaired relaxation).   2. Right ventricular systolic function is normal. The right ventricular  size is normal.   3. Left atrial size was moderately dilated.   4. The mitral valve is normal in structure. Trivial mitral valve  regurgitation. No evidence of mitral stenosis.   5. The aortic valve is tricuspid. There is moderate calcification of the  aortic valve. Aortic valve regurgitation is mild. Moderate aortic valve  stenosis. Aortic regurgitation PHT measures 404 msec. Aortic valve area,  by VTI measures 1.21 cm. Aortic  valve mean gradient measures 25.7 mmHg. Aortic valve Vmax measures 3.54  m/s.   6. The inferior vena cava is normal in size with greater than 50%  respiratory variability, suggesting right atrial pressure of 3 mmHg.   Carotid Dopplers 01/2024: Summary:  Right Carotid: Evidence consistent with a total occlusion of the right  ICA. The                 distal CCA appears occluded.   Left Carotid: Velocities in the left ICA are consistent with a 80-99%  stenosis.               The ECA appears >50% stenosed.   Vertebrals:  Bilateral vertebral arteries demonstrate antegrade flow.  Subclavians: Normal flow  hemodynamics were seen in bilateral subclavian               arteries.   Patient Profile   63 y.o. female with carotid stenosis s/p transcarotid revascularization 02/12/24, post-op atrial fibrillation, COPD, cecal AVM, GIB, hemorrhoids admitted with symptomatic GI  bleed.  Cardiology consulted for atrial fibrillation with RVR.   Assessment & Plan   # Atrial fibrillation with RVR:  # GI Bleed:  New after carotid intervention 02/2024.  Initially managed on oral metoprolol .  She was started on a diltiazem  infusion this admission.  Now discontinued.  Started amiodarone  yesterday and she is maintaining sinus rhythm.  We will transition to oral amiodarone .  Transition to 200 mg twice daily for 2 weeks followed by 200 mg daily.  Hopefully she will not need this long-term.  Holding Eliquis  for now.  No plans for upper or lower endoscopy for now given the relatively mild drop in H/H.  When she is further out from her vascular procedure, consider switching to Eliquis  long-term.  She is at high risk of recurrent atrial fibrillation giving her moderately enlarged atria on echo.  Will increase the metoprolol  to 50 mg twice daily.  # Carotid stenosis: Underwent transcarotid revascularization 02/12/24.  She was admitted on ASA, Eliquis  and Plavix .  Per vascular, holding aspirin , though it is not ideal so close to her recent surgery.  Continuing the clopidogrel .  # Hyperlipidemia:  Continue atorvastatin .  # Hypertension:  Home amlodipine , lisinopril   were previously held.  Recommend resuming 20 mg of lisinopril  for now.  Increasing metoprolol  as above.  Continue metoprolol .     For questions or updates, please contact [facility] Please consult [email or web address] for contact info under     Signed, Annabella Scarce, MD  02/19/2024, 9:43 AM

## 2024-02-20 ENCOUNTER — Telehealth: Payer: Self-pay

## 2024-02-20 NOTE — Transitions of Care (Post Inpatient/ED Visit) (Signed)
   02/20/2024  Name: Denise [name] MRN: [id number] DOB: 08-Apr-1961  Today's TOC FU Call Status: Today's TOC FU Call Status:: Unsuccessful Call (1st Attempt) Unsuccessful Call (1st Attempt) Date: 02/20/24  Attempted to reach the patient regarding the most recent Inpatient/ED visit.  Follow Up Plan: Additional outreach attempts will be made to reach the patient to complete the Transitions of Care (Post Inpatient/ED visit) call.   Arvin Seip RN, BSN, CCM [employer], Population Health Case Manager Phone: [phone]

## 2024-02-21 ENCOUNTER — Observation Stay (HOSPITAL_COMMUNITY)
Admission: EM | Admit: 2024-02-21 | Discharge: 2024-02-23 | Disposition: A | Discharge status: Home / Self Care | Attending: Emergency Medicine | Admitting: Emergency Medicine

## 2024-02-21 ENCOUNTER — Encounter (HOSPITAL_COMMUNITY): Payer: Self-pay

## 2024-02-21 ENCOUNTER — Emergency Department (HOSPITAL_COMMUNITY)

## 2024-02-21 DIAGNOSIS — I480 Paroxysmal atrial fibrillation: Secondary | ICD-10-CM | POA: Insufficient documentation

## 2024-02-21 DIAGNOSIS — I2510 Atherosclerotic heart disease of native coronary artery without angina pectoris: Secondary | ICD-10-CM | POA: Diagnosis present

## 2024-02-21 DIAGNOSIS — E222 Syndrome of inappropriate secretion of antidiuretic hormone: Secondary | ICD-10-CM | POA: Diagnosis present

## 2024-02-21 DIAGNOSIS — J449 Chronic obstructive pulmonary disease, unspecified: Secondary | ICD-10-CM | POA: Diagnosis not present

## 2024-02-21 DIAGNOSIS — Q2733 Arteriovenous malformation of digestive system vessel: Secondary | ICD-10-CM | POA: Diagnosis not present

## 2024-02-21 DIAGNOSIS — I6523 Occlusion and stenosis of bilateral carotid arteries: Secondary | ICD-10-CM | POA: Diagnosis present

## 2024-02-21 DIAGNOSIS — Z79899 Other long term (current) drug therapy: Secondary | ICD-10-CM | POA: Diagnosis not present

## 2024-02-21 DIAGNOSIS — K5520 Angiodysplasia of colon without hemorrhage: Secondary | ICD-10-CM

## 2024-02-21 DIAGNOSIS — I779 Disorder of arteries and arterioles, unspecified: Secondary | ICD-10-CM | POA: Diagnosis present

## 2024-02-21 DIAGNOSIS — Z7901 Long term (current) use of anticoagulants: Secondary | ICD-10-CM | POA: Insufficient documentation

## 2024-02-21 DIAGNOSIS — F419 Anxiety disorder, unspecified: Secondary | ICD-10-CM | POA: Diagnosis present

## 2024-02-21 DIAGNOSIS — F17200 Nicotine dependence, unspecified, uncomplicated: Secondary | ICD-10-CM | POA: Diagnosis not present

## 2024-02-21 DIAGNOSIS — R55 Syncope and collapse: Secondary | ICD-10-CM | POA: Diagnosis not present

## 2024-02-21 DIAGNOSIS — E785 Hyperlipidemia, unspecified: Secondary | ICD-10-CM | POA: Diagnosis present

## 2024-02-21 DIAGNOSIS — Z720 Tobacco use: Secondary | ICD-10-CM | POA: Diagnosis present

## 2024-02-21 DIAGNOSIS — R569 Unspecified convulsions: Secondary | ICD-10-CM

## 2024-02-21 DIAGNOSIS — F32A Depression, unspecified: Secondary | ICD-10-CM | POA: Diagnosis present

## 2024-02-21 DIAGNOSIS — I10 Essential (primary) hypertension: Secondary | ICD-10-CM | POA: Diagnosis not present

## 2024-02-21 LAB — CBC WITH DIFFERENTIAL/PLATELET
Abs Immature Granulocytes: 0.1 K/uL — ABNORMAL HIGH (ref 0.00–0.07)
Basophils Absolute: 0.1 K/uL (ref 0.0–0.1)
Basophils Relative: 0 %
Eosinophils Absolute: 0.1 K/uL (ref 0.0–0.5)
Eosinophils Relative: 1 %
HCT: 40.5 % (ref 36.0–46.0)
Hemoglobin: 13.8 g/dL (ref 12.0–15.0)
Immature Granulocytes: 1 %
Lymphocytes Relative: 6 %
Lymphs Abs: 0.8 K/uL (ref 0.7–4.0)
MCH: 32.2 pg (ref 26.0–34.0)
MCHC: 34.1 g/dL (ref 30.0–36.0)
MCV: 94.4 fL (ref 80.0–100.0)
Monocytes Absolute: 1 K/uL (ref 0.1–1.0)
Monocytes Relative: 8 %
Neutro Abs: 11.1 K/uL — ABNORMAL HIGH (ref 1.7–7.7)
Neutrophils Relative %: 84 %
Platelets: 376 K/uL (ref 150–400)
RBC: 4.29 MIL/uL (ref 3.87–5.11)
RDW: 20 % — ABNORMAL HIGH (ref 11.5–15.5)
WBC: 13.2 K/uL — ABNORMAL HIGH (ref 4.0–10.5)
nRBC: 0 % (ref 0.0–0.2)

## 2024-02-21 LAB — I-STAT CHEM 8, ED
BUN: 6 mg/dL — ABNORMAL LOW (ref 8–23)
Calcium, Ion: 1.09 mmol/L — ABNORMAL LOW (ref 1.15–1.40)
Chloride: 96 mmol/L — ABNORMAL LOW (ref 98–111)
Creatinine, Ser: 0.6 mg/dL (ref 0.44–1.00)
Glucose, Bld: 146 mg/dL — ABNORMAL HIGH (ref 70–99)
HCT: 43 % (ref 36.0–46.0)
Hemoglobin: 14.6 g/dL (ref 12.0–15.0)
Potassium: 4.1 mmol/L (ref 3.5–5.1)
Sodium: 129 mmol/L — ABNORMAL LOW (ref 135–145)
TCO2: 22 mmol/L (ref 22–32)

## 2024-02-21 LAB — URINALYSIS, W/ REFLEX TO CULTURE (INFECTION SUSPECTED)
Bilirubin Urine: NEGATIVE
Glucose, UA: NEGATIVE mg/dL
Hgb urine dipstick: NEGATIVE
Ketones, ur: NEGATIVE mg/dL
Leukocytes,Ua: NEGATIVE
Nitrite: NEGATIVE
Protein, ur: NEGATIVE mg/dL
Specific Gravity, Urine: 1.013 (ref 1.005–1.030)
pH: 5 (ref 5.0–8.0)

## 2024-02-21 LAB — COMPREHENSIVE METABOLIC PANEL WITH GFR
ALT: 17 U/L (ref 0–44)
AST: 25 U/L (ref 15–41)
Albumin: 3.5 g/dL (ref 3.5–5.0)
Alkaline Phosphatase: 80 U/L (ref 38–126)
Anion gap: 12 (ref 5–15)
BUN: 6 mg/dL — ABNORMAL LOW (ref 8–23)
CO2: 20 mmol/L — ABNORMAL LOW (ref 22–32)
Calcium: 8.9 mg/dL (ref 8.9–10.3)
Chloride: 96 mmol/L — ABNORMAL LOW (ref 98–111)
Creatinine, Ser: 0.71 mg/dL (ref 0.44–1.00)
GFR, Estimated: >60 mL/min (ref >60)
Glucose, Bld: 142 mg/dL — ABNORMAL HIGH (ref 70–99)
Potassium: 4.1 mmol/L (ref 3.5–5.1)
Sodium: 128 mmol/L — ABNORMAL LOW (ref 135–145)
Total Bilirubin: 1 mg/dL (ref 0.0–1.2)
Total Protein: 6.6 g/dL (ref 6.5–8.1)

## 2024-02-21 LAB — PROTIME-INR
INR: 1 (ref 0.8–1.2)
Prothrombin Time: 14 s (ref 11.4–15.2)

## 2024-02-21 LAB — APTT: aPTT: 30 s (ref 24–36)

## 2024-02-21 LAB — TROPONIN I (HIGH SENSITIVITY)
Troponin I (High Sensitivity): 8 ng/L (ref <18)
Troponin I (High Sensitivity): 10 ng/L (ref <18)

## 2024-02-21 MED ORDER — LORAZEPAM 2 MG/ML IJ SOLN
1.0000 mg | Freq: Once | INTRAMUSCULAR | Status: AC | PRN
  Administered 2024-02-21: 1 mg via INTRAVENOUS
  Filled 2024-02-21: qty 1

## 2024-02-21 MED ORDER — ACETAMINOPHEN 500 MG PO TABS
500.0000 mg | ORAL_TABLET | Freq: Once | ORAL | Status: AC
  Administered 2024-02-21: 500 mg via ORAL
  Filled 2024-02-21: qty 1

## 2024-02-21 MED ORDER — IOHEXOL 350 MG/ML SOLN
75.0000 mL | Freq: Once | INTRAVENOUS | Status: AC | PRN
  Administered 2024-02-21: 75 mL via INTRAVENOUS

## 2024-02-21 NOTE — ED Provider Notes (Signed)
 [place] [facility] Provider Note   CSN: [id number] Arrival date & time: 02/21/24  1453   Patient presents with: Altered Mental Status and Near Syncope   Denise [name] is a 63 y.o. 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 Patient has been experiencing bilateral tinnitus since recent discharge. She felt funny earlier today but was able to drive her car a short distance and was overall behaving normally per her daughter until later in the afternoon while standing doing laundry patient suddenly tensed up with arms triple-flexed and she was staring/unresponsive. Her daughter witnessed this and immediately grabbed the patient and eased her to the ground, where patient's arms were outstretched and moving rhythmically while she was making grunting noises and drooling from the mouth for at least 4 minutes. Patient was unconscious during this event and for another 3-4 minutes until EMS arrived, when she seemed post-ictal per EMS and was alert but confused. Patient does not recall events just prior to this episode and until being en route with EMS. She has no prior seizure history. She reports a current 2/10 headache in the back of her head. She otherwise denies CP, SOB, N/V, or active tinnitus.    Prior to Admission medications   Medication Sig Start Date End Date Taking? Authorizing Provider  clopidogrel  (PLAVIX ) 75 MG tablet Take 1 tablet (75 mg total) by mouth daily. 02/10/24  Yes Gretta Lonni PARAS, MD  acetaminophen  (TYLENOL ) 500  MG tablet Take 2 tablets (1,000 mg total) by mouth every 8 (eight) hours as needed. Patient taking differently: Take 1,000 mg by mouth daily as needed for mild pain (pain score 1-3) or moderate pain (pain score 4-6). 10/31/23   Maczis, Michael M, PA-C  albuterol  (VENTOLIN  HFA) 108 (90 Base) MCG/ACT inhaler INHALE 2 PUFFS INTO THE LUNGS EVERY 4-6 HOURS AS NEEDED FOR TIGHTNESS/WHEEZING Patient taking differently: Inhale 1-2 puffs into the lungs daily as needed for wheezing or shortness of breath. 01/31/24   Gretta Comer POUR, NP  amiodarone  (PACERONE ) 200 MG tablet Take 1 tablet (200 mg total) by mouth 2 (two) times daily for 14 days, THEN 1 tablet (200 mg total) daily. 02/19/24 04/03/24  Laurence Locus, DO  Ascorbic Acid  (VITAMIN C ) 1000 MG tablet Take 2,000 mg by mouth daily.    [provider]  atorvastatin  (LIPITOR ) 80 MG tablet TAKE 1 TABLET (80 MG TOTAL) BY MOUTH DAILY FOR CHOLESTEROL 01/15/24 01/14/25  Clark, Katherine K, NP  busPIRone  (BUSPAR ) 5 MG tablet Take 1 tablet (5 mg total) by mouth 2 (two) times daily. For anxiety 01/31/24   Clark, Katherine K, NP  escitalopram  (LEXAPRO ) 20 MG tablet TAKE 1 TABLET BY MOUTH DAILY FOR ANXIETY 12/15/23   Gretta Comer POUR, NP  ferrous sulfate  325 (65 FE) MG tablet Take 650 mg by mouth daily with breakfast.    [provider]  fluticasone -salmeterol (WIXELA INHUB) 250-50 MCG/ACT AEPB INHALE 1 PUFF INTO THE LUNGS IN THE MORNING AND AT BEDTIME. Patient taking differently: Inhale 1 puff into the lungs 2 (two) times daily. 08/18/23   Clark, Katherine K, NP  lisinopril  (ZESTRIL ) 20 MG tablet Take 1 tablet (20 mg total)  by mouth daily. for blood pressure. 02/19/24 03/20/24  Laurence Locus, DO  metoprolol  tartrate (LOPRESSOR ) 50 MG tablet Take 1 tablet (50 mg total) by mouth 2 (two) times daily. 02/19/24 03/20/24  Laurence Locus, DO  nystatin  (MYCOSTATIN ) 100000 UNIT/ML suspension Take 5 mLs (500,000 Units total) by mouth 4 (four) times daily for 10 days. 02/19/24  02/29/24  Laurence Locus, DO  oxyCODONE -acetaminophen  (PERCOCET/ROXICET) 5-325 MG tablet Take 1 tablet by mouth every 6 (six) hours as needed for moderate pain (pain score 4-6). Patient taking differently: Take 1 tablet by mouth daily as needed for moderate pain (pain score 4-6). 02/14/24   Bethanie Cough, PA-C  pantoprazole  (PROTONIX ) 40 MG tablet Take 1 tablet (40 mg total) by mouth 2 (two) times daily. 02/19/24 03/20/24  Laurence Locus, DO  pyridoxine  (B-6) 100 MG tablet Take 100 mg by mouth daily.    [provider]  sodium chloride  1 g tablet TAKE 1 TABLET (1 G TOTAL) BY MOUTH 2 (TWO) TIMES DAILY WITH A MEAL. TO INCREASE SODIUM Patient taking differently: Take 1 g by mouth 2 (two) times daily with a meal. 01/07/24   Gretta Comer POUR, NP  vitamin B-12 (CYANOCOBALAMIN ) 1000 MCG tablet Take 1,000 mcg by mouth daily.    [provider]  vitamin E  180 MG (400 UNITS) capsule Take 400 Units by mouth daily.    [provider]    Allergies: Patient has no known allergies.     Updated Vital Signs BP 125/71   Pulse 81   Resp (!) 21   SpO2 91%   Physical Exam Vitals reviewed.  Constitutional:      General: She is not in acute distress.    Appearance: She is not toxic-appearing or diaphoretic.  HENT:     Head: Normocephalic and atraumatic.     Ears:     Comments: Hearing grossly normal/equal    Nose: Nose normal.     Mouth/Throat:     Mouth: Mucous membranes are moist.     Pharynx: Oropharynx is clear.  Eyes:     Extraocular Movements: Extraocular movements intact.     Right eye: No nystagmus.     Left eye: No nystagmus.     Comments: R pupil 4mm, L pupil 2mm (per patient this is baseline and she does not see out of the R eye)  Cardiovascular:     Rate and Rhythm: Normal rate. Rhythm irregular.     Pulses: Normal pulses.     Heart sounds: No murmur heard.    No gallop.  Abdominal:     General: Abdomen is flat.     Palpations: Abdomen is soft.     Tenderness:  There is no abdominal tenderness. There is no guarding.  Musculoskeletal:        General: No signs of injury.     Cervical back: Normal range of motion and neck supple. No rigidity or tenderness.     Right lower leg: No edema.     Left lower leg: No edema.  Skin:    General: Skin is warm and dry.     Capillary Refill: Capillary refill takes less than 2 seconds.     Coloration: Skin is not jaundiced.  Neurological:     Mental Status: She is alert and oriented to person, place, and time.     Cranial Nerves: No cranial nerve deficit.     Sensory: No sensory deficit.     Motor: No weakness.     (all  labs ordered are listed, but only abnormal results are displayed) Labs Reviewed  CBC WITH DIFFERENTIAL/PLATELET - Abnormal; Notable for the following components:      Result Value   WBC 13.2 (*)    RDW 20.0 (*)    Neutro Abs 11.1 (*)    Abs Immature Granulocytes 0.10 (*)    All other components within normal limits  COMPREHENSIVE METABOLIC PANEL WITH GFR - Abnormal; Notable for the following components:   Sodium 128 (*)    Chloride 96 (*)    CO2 20 (*)    Glucose, Bld 142 (*)    BUN 6 (*)    All other components within normal limits  URINALYSIS, W/ REFLEX TO CULTURE (INFECTION SUSPECTED) - Abnormal; Notable for the following components:   Color, Urine STRAW (*)    Bacteria, UA RARE (*)    All other components within normal limits  I-STAT CHEM 8, ED - Abnormal; Notable for the following components:   Sodium 129 (*)    Chloride 96 (*)    BUN 6 (*)    Glucose, Bld 146 (*)    Calcium , Ion 1.09 (*)    All other components within normal limits  PROTIME-INR  APTT  TROPONIN I (HIGH SENSITIVITY)  TROPONIN I (HIGH SENSITIVITY)    EKG: EKG Interpretation Date/Time:  Friday February 21 2024 15:04:11 EDT Ventricular Rate:  79 PR Interval:  167 QRS Duration:  82 QT Interval:  394 QTC Calculation: 452 R Axis:   22  Text Interpretation: Sinus rhythm Atrial premature complexes Anterior  infarct, old No significant change since last tracing Confirmed by Patt Alm DEL [phone]) on 02/21/2024 5:01:49 PM    Medications Ordered in the ED  acetaminophen  (TYLENOL ) tablet 500 mg (500 mg Oral Given 02/21/24 1615)  iohexol  (OMNIPAQUE ) 350 MG/ML injection 75 mL (75 mLs Intravenous Contrast Given 02/21/24 1735)  LORazepam  (ATIVAN ) injection 1 mg (1 mg Intravenous Given 02/21/24 1815)    Clinical Course as of 02/22/24 0146  Fri Feb 21, 2024  1632 WBC(!): 13.2 New leukocytosis from 9.8 a few days ago. Otherwise unremarkable CBC [AD]  1633 DG Chest Portable 1 View Emphysema. No acute cardiopulmonary abnormality. 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  Patent stent within the distal left common carotid artery extending into the ICA.   [AD]  2002 Trops 8 -> 10 [AD]    Clinical Course User Index [AD] Raoul Rake, MD    Medical Decision Making Patient with the above history is presenting with episode concerning for syncope vs new-onset seizure. Given her history, she is high risk syncope and will initiate basic syncope workup, with increased concern for cardiac cause or possible cerebral vasculopathy given patient's history. The description of the event with patient seeming posturing with BUE triple flexion before being eased to the ground where she had roughly 4 minutes of BUE tonic/clonic movements and grunting noises is more concerning for new onset seizure episode  rather than convulsive syncope given length of symptoms, and prolonged confused state  following it likely being post-ictal state. Will get CTA head/neck and basic labs as above.   Consulted neurology service who recommended MRI and EEG. MRI resulted as above with no acute/new findings and EEG was unable to be performed in the ED prior to EEG techs leaving for the night. Patient was admitted to hospitalist given her history and high-risk syncope vs concerning new seizure with plan to get EEG whenever next able.Patient and daughter were updated on results above and plan for admission.  Amount and/or Complexity of Data Reviewed Labs: ordered. Decision-making details documented in ED Course. Radiology: ordered. Decision-making details documented in ED Course. ECG/medicine tests: ordered.  Risk OTC drugs. Prescription drug management. Decision regarding hospitalization.    Final diagnoses:  Syncope and collapse  Seizure-like activity (HCC)        Raoul Rake, MD 02/22/24 0158    Patt Alm Macho, MD 02/22/24 [phone]

## 2024-02-21 NOTE — Progress Notes (Signed)
 PT in MRI, will check back when schedule permits

## 2024-02-21 NOTE — ED Notes (Signed)
 Went to MRI

## 2024-02-21 NOTE — ED Notes (Signed)
 Called lab to inform them that labs has been sent down, they stated they have not received it but will let me know if it doesn't show within the next 15-30 minutes

## 2024-02-21 NOTE — Transitions of Care (Post Inpatient/ED Visit) (Signed)
 02/21/2024 Chart reviewed by [facility] RN. Patient currently in ED. No outreach indicated at this time.  Patient ID: Denise [name], female   DOB: Jan 12, 1961, 63 y.o.   MRN: [id number]  Shona Prow RN, CCM [place]  [facility] RN Care Manager [phone]

## 2024-02-21 NOTE — ED Triage Notes (Addendum)
 Pt BIB [facility] from home for a syncopal episode and fall. Pt was recently d/c 11th, had stent placed in carotid artery. Family reports pt experiencing increased confusion since. EMS reports pt postical state/confused upon their arrival. Pt states she felt funny and had ringing in her ears when she was putting on clothes and then woke up in the ambulance. Ccollar in place PTA due to history of eliquis . Unknown if pt hit head  Bp 174/96 HR 80s CBG 131

## 2024-02-21 NOTE — H&P (Signed)
 History and Physical    Patient: Denise [name] FMW:[id number] DOB: Jan 01, 1961 DOA: 02/21/2024 DOS: the patient was seen and examined on 02/21/2024 PCP: Gretta Comer POUR, NP  Patient coming from: Home  Chief Complaint:  Chief Complaint  Patient presents with   Altered Mental Status   Near Syncope   HPI: Denise [name] is a 63 y.o. 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  At that point she developed rectal bleed with GI consult.  Patient did better and was discharged home.  She came into the ER today after passing out in the condition suspected to be seizure versus syncope.  Patient was out for about 40 minutes suspected to be post ictal state.  Patient is now fully awake and alert.  She apparently was observed to have tonic-clonic seizures at the scene.  Neurology has seen the patient and recommends MRI of the brain as well as EEG.  MRI of the brain done showed no acute findings.  She is currently stable.  Patient being admitted for evaluation of possible syncope versus seizure.  Review of Systems: As mentioned in the history of present illness. All other systems reviewed and are negative. Past Medical History:  Diagnosis Date   Allergy    Anemia 12/24/2019   Anxiety    Aortic insufficiency    Aortic stenosis    Arthritis    Carotid artery disease (HCC)    Centrilobular emphysema (HCC) 11/2023   Chickenpox    Cholecystitis with cholelithiasis 03/31/2012   Closed fracture of L4  transverse process s/p traumatic mechanical fall 10/26/2023   COPD (chronic obstructive pulmonary disease) (HCC)    Coronary artery disease    Depression    Diastolic dysfunction    Dyspnea    with exertion   GERD (gastroesophageal reflux disease)    Headache    Heart murmur    High anion gap metabolic acidosis 10/26/2023   History of blood transfusion 2021   HLD (hyperlipidemia)    Hypertension 03/31/2012   Hyponatremia in setting of SIADH    Left lower lobe pulmonary nodule 11/2023   Long-term use of aspirin  therapy    Lumbar compression fracture s/p traumatic mechanical fall; L4 superior endplate    Pneumonia    x 2   PVD (peripheral vascular disease) (HCC)    Shingles    SIADH (syndrome of inappropriate ADH production) (HCC)    Traumatic mechanical fall 10/26/2023   a.) s/p traumatic fall 10/26/2023 reuslting in multiple RIGHT rib (5th - 7th) fractures (required dchest tube), l$ transverse process fracture, L4 superior endplate compression fracture, large volume post-traumatic myofacial/subcutaneous gas collection in chest   Past Surgical History:  Procedure Laterality Date   BIOPSY  12/24/2019   Procedure: BIOPSY;  Surgeon: Teressa Toribio SQUIBB, MD;  Location: [facility] ENDOSCOPY;  Service: Endoscopy;;   BRONCHIAL BIOPSY  12/19/2023   Procedure: BRONCHOSCOPY, WITH BIOPSY;  Surgeon: Isadora Hose, MD;  Location: [facility] ENDOSCOPY;  Service: Pulmonary;;   BRONCHIAL BRUSHINGS  12/19/2023   Procedure: BRONCHOSCOPY, WITH BRUSH BIOPSY;  Surgeon: Isadora Hose, MD;  Location: [facility] ENDOSCOPY;  Service: Pulmonary;;   BRONCHIAL NEEDLE ASPIRATION BIOPSY  12/19/2023   Procedure: BRONCHOSCOPY, WITH NEEDLE ASPIRATION BIOPSY;  Surgeon: Isadora Hose, MD;  Location: [facility] ENDOSCOPY;  Service: Pulmonary;;   BRONCHIAL WASHINGS  12/19/2023   Procedure: IRRIGATION, BRONCHUS;  Surgeon: Isadora Hose, MD;  Location: [facility] ENDOSCOPY;  Service: Pulmonary;;   BRONCHOSCOPY, WITH BIOPSY USING ELECTROMAGNETIC NAVIGATION  Bilateral 12/19/2023   Procedure: ROBOTIC ASSISTED NAVIGATIONAL BRONCHOSCOPY;  Surgeon: Isadora Hose, MD;  Location: [facility] ENDOSCOPY;  Service: Pulmonary;  Laterality: Bilateral;   CATARACT EXTRACTION Bilateral 2021   CHOLECYSTECTOMY  03/30/2012   Procedure: LAPAROSCOPIC CHOLECYSTECTOMY WITH INTRAOPERATIVE CHOLANGIOGRAM;  Surgeon: Morene ONEIDA Olives, MD;  Location: [facility] ORS;  Service: General;  Laterality: N/A;   COLONOSCOPY WITH PROPOFOL  N/A 12/24/2019   Procedure: COLONOSCOPY WITH PROPOFOL ;  Surgeon: Teressa Toribio SQUIBB, MD;  Location: [facility] ENDOSCOPY;  Service: Endoscopy;  Laterality: N/A;   ESOPHAGOGASTRODUODENOSCOPY (EGD) WITH PROPOFOL  N/A 12/24/2019   Procedure: ESOPHAGOGASTRODUODENOSCOPY (EGD) WITH PROPOFOL ;  Surgeon: Teressa Toribio SQUIBB, MD;  Location: [facility] ENDOSCOPY;  Service: Endoscopy;  Laterality: N/A;   HOT HEMOSTASIS N/A 12/24/2019   Procedure: HOT HEMOSTASIS (ARGON PLASMA COAGULATION/BICAP);  Surgeon: Teressa Toribio SQUIBB, MD;  Location: [facility] ENDOSCOPY;  Service: Endoscopy;  Laterality: N/A;   POLYPECTOMY  12/24/2019   Procedure: POLYPECTOMY;  Surgeon: Teressa Toribio SQUIBB, MD;  Location: [facility] ENDOSCOPY;  Service: Endoscopy;;   TRANSCAROTID ARTERY REVASCULARIZATION  Left 02/12/2024   Procedure: TRANSCAROTID ARTERY REVASCULARIZATION (TCAR);  Surgeon: Gretta Lonni PARAS, MD;  Location: [facility] OR;  Service: Vascular;  Laterality: Left;   TUBAL LIGATION  1997   VIDEO BRONCHOSCOPY WITH ENDOBRONCHIAL ULTRASOUND  12/19/2023   Procedure: BRONCHOSCOPY, WITH EBUS;  Surgeon: Isadora Hose, MD;  Location: [facility] ENDOSCOPY;  Service: Pulmonary;;   VIDEO BRONCHOSCOPY WITH RADIAL ENDOBRONCHIAL ULTRASOUND  12/19/2023   Procedure: VIDEO BRONCHOSCOPY WITH RADIAL ENDOBRONCHIAL ULTRASOUND;  Surgeon: Isadora Hose, MD;  Location: [facility] ENDOSCOPY;  Service: Pulmonary;;   Social History:  reports that she quit smoking about 9 months ago. Her smoking use included cigarettes. She started smoking about 38 years ago. She has a 28.5 pack-year  smoking history. She has never used smokeless tobacco. She reports current alcohol use of about 1.0 standard drink of alcohol per week. She reports that she does not use drugs.  No Known Allergies  Family History  Problem Relation Age of Onset   Arthritis Mother    Cervical cancer Mother    Heart disease Father    Hypertension Father     Prior to Admission medications   Medication Sig Start Date End Date Taking? Authorizing Provider  clopidogrel  (PLAVIX ) 75 MG tablet Take 1 tablet (75 mg total) by mouth daily. 02/10/24  Yes Gretta Lonni PARAS, MD  acetaminophen  (TYLENOL ) 500 MG tablet Take 2 tablets (1,000 mg total) by mouth every 8 (eight) hours as needed. Patient taking differently: Take 1,000 mg by mouth daily as needed for mild pain (pain score 1-3) or moderate pain (pain score 4-6). 10/31/23   Maczis, Michael M, PA-C  albuterol  (VENTOLIN  HFA) 108 (90 Base) MCG/ACT inhaler INHALE 2 PUFFS INTO THE LUNGS EVERY 4-6 HOURS AS NEEDED FOR TIGHTNESS/WHEEZING Patient taking differently: Inhale 1-2 puffs into the lungs daily as needed for wheezing or shortness of breath. 01/31/24   Clark, Katherine K, NP  amiodarone  (PACERONE ) 200 MG tablet Take 1 tablet (200 mg total) by mouth 2 (two) times daily for 14 days, THEN 1 tablet (200 mg total) daily. 02/19/24 04/03/24  Laurence Locus, DO  Ascorbic Acid (VITAMIN  C) 1000 MG tablet Take 2,000 mg by mouth daily.    [provider]  atorvastatin  (LIPITOR ) 80 MG tablet TAKE 1 TABLET (80 MG TOTAL) BY MOUTH DAILY FOR CHOLESTEROL 01/15/24 01/14/25  Clark, Katherine K, NP  busPIRone  (BUSPAR ) 5 MG tablet Take 1 tablet (5 mg total) by mouth 2 (two) times daily. For anxiety 01/31/24   Clark, Katherine K, NP  escitalopram  (LEXAPRO ) 20 MG tablet TAKE 1 TABLET BY MOUTH DAILY FOR ANXIETY 12/15/23   Gretta Comer POUR, NP  ferrous sulfate  325 (65 FE) MG tablet Take 650 mg by mouth daily with breakfast.    [provider]  fluticasone -salmeterol (WIXELA INHUB)  250-50 MCG/ACT AEPB INHALE 1 PUFF INTO THE LUNGS IN THE MORNING AND AT BEDTIME. Patient taking differently: Inhale 1 puff into the lungs 2 (two) times daily. 08/18/23   Clark, Katherine K, NP  lisinopril  (ZESTRIL ) 20 MG tablet Take 1 tablet (20 mg total) by mouth daily. for blood pressure. 02/19/24 03/20/24  Laurence Locus, DO  metoprolol  tartrate (LOPRESSOR ) 50 MG tablet Take 1 tablet (50 mg total) by mouth 2 (two) times daily. 02/19/24 03/20/24  Laurence Locus, DO  nystatin  (MYCOSTATIN ) 100000 UNIT/ML suspension Take 5 mLs (500,000 Units total) by mouth 4 (four) times daily for 10 days. 02/19/24 02/29/24  Laurence Locus, DO  oxyCODONE -acetaminophen  (PERCOCET/ROXICET) 5-325 MG tablet Take 1 tablet by mouth every 6 (six) hours as needed for moderate pain (pain score 4-6). Patient taking differently: Take 1 tablet by mouth daily as needed for moderate pain (pain score 4-6). 02/14/24   Bethanie Cough, PA-C  pantoprazole  (PROTONIX ) 40 MG tablet Take 1 tablet (40 mg total) by mouth 2 (two) times daily. 02/19/24 03/20/24  Laurence Locus, DO  pyridoxine (B-6) 100 MG tablet Take 100 mg by mouth daily.    [provider]  sodium chloride  1 g tablet TAKE 1 TABLET (1 G TOTAL) BY MOUTH 2 (TWO) TIMES DAILY WITH A MEAL. TO INCREASE SODIUM Patient taking differently: Take 1 g by mouth 2 (two) times daily with a meal. 01/07/24   Gretta Comer POUR, NP  vitamin B-12 (CYANOCOBALAMIN ) 1000 MCG tablet Take 1,000 mcg by mouth daily.    [provider]  vitamin E 180 MG (400 UNITS) capsule Take 400 Units by mouth daily.    [provider]    Physical Exam: Vitals:   02/21/24 1502 02/21/24 1515 02/21/24 1530 02/21/24 1537  BP:    (!) 151/89  Pulse:  74 77 (!) 109  Resp:  17 17 (!) 22  SpO2: 99% 96% 96% 90%   Constitutional: Acutely ill looking, stable, NAD, calm, comfortable Eyes: PERRL, lids and conjunctivae normal ENMT: Mucous membranes are moist. Posterior pharynx clear of any exudate or lesions.Normal  dentition.  Neck: normal, supple, no masses, no thyromegaly Respiratory: clear to auscultation bilaterally, no wheezing, no crackles. Normal respiratory effort. No accessory muscle use.  Cardiovascular: Regular rate and rhythm, no murmurs / rubs / gallops. No extremity edema. 2+ pedal pulses. No carotid bruits.  Abdomen: no tenderness, no masses palpated. No hepatosplenomegaly. Bowel sounds positive.  Musculoskeletal: Good range of motion, no joint swelling or tenderness, Skin: no rashes, lesions, ulcers. No induration Neurologic: CN 2-12 grossly intact. Sensation intact, DTR normal. Strength 5/5 in all 4.  Psychiatric: Normal judgment and insight. Alert and oriented x 3.  Anxious mood  Data Reviewed:  Afebrile, blood pressure 150/89, pulse 109, white count is 13.2, sodium 129, CO2 20 chloride 96 BUN 6 glucose 146.  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  There is patent stent within the distal left common carotid artery into the cleanest segment patent stent within the distal left common carotid artery extending into the ICA  Assessment and Plan:  #1 seizure versus syncope: Patient will be admitted for observation.  MRI of the brain already showed no acute findings.  Patient also had echocardiogram just a few days ago.  Will get EEG per neurology.  May need to get PT  #2 bilateral carotid artery stenosis: Status post recent procedure.  This may have led to syncope causing patient's symptoms.  Continue monitoring.  #3 SIADH: Sodium is 129.  Continue to monitor.  #4 aortic stenosis: Chronic.  Most likely the cause of her symptoms.  Continue cardiology follow-up.  #5 history of atrial  fibrillation: Not on anticoagulation due to recent GI bleed.  Rate is controlled.  Continue to monitor  #6 tobacco abuse: Vaccination counseling.  #7 hyperlipidemia: Continue with statin  #8 iron deficiency anemia: Continue to monitor H&H  #9 anxiety with depression: Continue anxiolytics.    Advance Care Planning:   Code Status: Full Code   Consults: Neurology, Dr. Berlin  Family Communication: No family at bedside  Severity of Illness: The appropriate patient status for this patient is OBSERVATION. Observation status is judged to be reasonable and necessary in order to provide the required intensity of service to ensure the patient's safety. The patient's presenting symptoms, physical exam findings, and initial radiographic and laboratory data in the context of their medical condition is felt to place them at decreased risk for further clinical deterioration. Furthermore, it is anticipated that the patient will be medically stable for discharge from the hospital within 2 midnights of admission.   AuthorBETHA SIM KNOLL, MD 02/21/2024 10:03 PM  For on call review [email or web address].

## 2024-02-22 ENCOUNTER — Observation Stay (HOSPITAL_BASED_OUTPATIENT_CLINIC_OR_DEPARTMENT_OTHER)

## 2024-02-22 ENCOUNTER — Observation Stay (HOSPITAL_COMMUNITY)

## 2024-02-22 DIAGNOSIS — R569 Unspecified convulsions: Secondary | ICD-10-CM | POA: Diagnosis not present

## 2024-02-22 DIAGNOSIS — R4182 Altered mental status, unspecified: Secondary | ICD-10-CM

## 2024-02-22 DIAGNOSIS — R55 Syncope and collapse: Secondary | ICD-10-CM | POA: Diagnosis not present

## 2024-02-22 LAB — CBC
HCT: 39.7 % (ref 36.0–46.0)
Hemoglobin: 13.2 g/dL (ref 12.0–15.0)
MCH: 32 pg (ref 26.0–34.0)
MCHC: 33.2 g/dL (ref 30.0–36.0)
MCV: 96.1 fL (ref 80.0–100.0)
Platelets: 343 K/uL (ref 150–400)
RBC: 4.13 MIL/uL (ref 3.87–5.11)
RDW: 20.2 % — ABNORMAL HIGH (ref 11.5–15.5)
WBC: 10.4 K/uL (ref 4.0–10.5)
nRBC: 0 % (ref 0.0–0.2)

## 2024-02-22 LAB — BASIC METABOLIC PANEL WITH GFR
Anion gap: 14 (ref 5–15)
BUN: 7 mg/dL — ABNORMAL LOW (ref 8–23)
CO2: 22 mmol/L (ref 22–32)
Calcium: 9.2 mg/dL (ref 8.9–10.3)
Chloride: 98 mmol/L (ref 98–111)
Creatinine, Ser: 0.68 mg/dL (ref 0.44–1.00)
GFR, Estimated: >60 mL/min (ref >60)
Glucose, Bld: 99 mg/dL (ref 70–99)
Potassium: 3.8 mmol/L (ref 3.5–5.1)
Sodium: 134 mmol/L — ABNORMAL LOW (ref 135–145)

## 2024-02-22 LAB — BRAIN NATRIURETIC PEPTIDE: B Natriuretic Peptide: 202 pg/mL — ABNORMAL HIGH (ref 0.0–100.0)

## 2024-02-22 LAB — OSMOLALITY, URINE: Osmolality, Ur: 359 mosm/kg (ref 300–900)

## 2024-02-22 LAB — COMPREHENSIVE METABOLIC PANEL WITH GFR
ALT: 18 U/L (ref 0–44)
AST: 22 U/L (ref 15–41)
Albumin: 3.3 g/dL — ABNORMAL LOW (ref 3.5–5.0)
Alkaline Phosphatase: 74 U/L (ref 38–126)
Anion gap: 10 (ref 5–15)
BUN: <5 mg/dL — ABNORMAL LOW (ref 8–23)
CO2: 22 mmol/L (ref 22–32)
Calcium: 9 mg/dL (ref 8.9–10.3)
Chloride: 101 mmol/L (ref 98–111)
Creatinine, Ser: 0.71 mg/dL (ref 0.44–1.00)
GFR, Estimated: >60 mL/min (ref >60)
Glucose, Bld: 95 mg/dL (ref 70–99)
Potassium: 3.9 mmol/L (ref 3.5–5.1)
Sodium: 133 mmol/L — ABNORMAL LOW (ref 135–145)
Total Bilirubin: 1.3 mg/dL — ABNORMAL HIGH (ref 0.0–1.2)
Total Protein: 6 g/dL — ABNORMAL LOW (ref 6.5–8.1)

## 2024-02-22 LAB — OSMOLALITY: Osmolality: 274 mosm/kg — ABNORMAL LOW (ref 275–295)

## 2024-02-22 LAB — ECHOCARDIOGRAM COMPLETE
AR max vel: 0.96 cm2
AV Area VTI: 0.98 cm2
AV Area mean vel: 0.91 cm2
AV Mean grad: 24 mmHg
AV Peak grad: 44 mmHg
Ao pk vel: 3.32 m/s
Area-P 1/2: 2.83 cm2

## 2024-02-22 LAB — TSH: TSH: 1.551 u[IU]/mL (ref 0.350–4.500)

## 2024-02-22 LAB — SODIUM, URINE, RANDOM: Sodium, Ur: 116 mmol/L

## 2024-02-22 LAB — URIC ACID: Uric Acid, Serum: 6.1 mg/dL (ref 2.5–7.1)

## 2024-02-22 LAB — CBG MONITORING, ED: Glucose-Capillary: 96 mg/dL (ref 70–99)

## 2024-02-22 LAB — GLUCOSE, CAPILLARY: Glucose-Capillary: 99 mg/dL (ref 70–99)

## 2024-02-22 LAB — CREATININE, URINE, RANDOM: Creatinine, Urine: 52 mg/dL

## 2024-02-22 MED ORDER — VITAMIN B-12 1000 MCG PO TABS
1000.0000 ug | ORAL_TABLET | Freq: Every day | ORAL | Status: DC
  Administered 2024-02-22 – 2024-02-23 (×2): 1000 ug via ORAL
  Filled 2024-02-22 (×2): qty 1

## 2024-02-22 MED ORDER — OXYCODONE-ACETAMINOPHEN 5-325 MG PO TABS
1.0000 | ORAL_TABLET | Freq: Four times a day (QID) | ORAL | Status: DC | PRN
  Administered 2024-02-22: 1 via ORAL
  Filled 2024-02-22: qty 1

## 2024-02-22 MED ORDER — SODIUM CHLORIDE 1 G PO TABS
1.0000 g | ORAL_TABLET | Freq: Two times a day (BID) | ORAL | Status: DC
  Administered 2024-02-22 – 2024-02-23 (×3): 1 g via ORAL
  Filled 2024-02-22 (×3): qty 1

## 2024-02-22 MED ORDER — ONDANSETRON HCL 4 MG PO TABS: 4.0000 mg | ORAL_TABLET | Freq: Four times a day (QID) | ORAL | Status: DC | PRN

## 2024-02-22 MED ORDER — VITAMIN C 500 MG PO TABS
2000.0000 mg | ORAL_TABLET | Freq: Every day | ORAL | Status: DC
  Administered 2024-02-22 – 2024-02-23 (×2): 2000 mg via ORAL
  Filled 2024-02-22 (×2): qty 4

## 2024-02-22 MED ORDER — PANTOPRAZOLE SODIUM 40 MG PO TBEC
40.0000 mg | DELAYED_RELEASE_TABLET | Freq: Two times a day (BID) | ORAL | Status: DC
  Administered 2024-02-22 – 2024-02-23 (×4): 40 mg via ORAL
  Filled 2024-02-22 (×4): qty 1

## 2024-02-22 MED ORDER — VITAMIN B-6 100 MG PO TABS
100.0000 mg | ORAL_TABLET | Freq: Every day | ORAL | Status: DC
  Administered 2024-02-22: 100 mg via ORAL
  Filled 2024-02-22 (×3): qty 1

## 2024-02-22 MED ORDER — ENOXAPARIN SODIUM 40 MG/0.4ML IJ SOSY
40.0000 mg | PREFILLED_SYRINGE | INTRAMUSCULAR | Status: DC
  Administered 2024-02-22 – 2024-02-23 (×2): 40 mg via SUBCUTANEOUS
  Filled 2024-02-22 (×2): qty 0.4

## 2024-02-22 MED ORDER — LISINOPRIL 20 MG PO TABS: 20.0000 mg | ORAL_TABLET | Freq: Every day | ORAL | Status: DC

## 2024-02-22 MED ORDER — METOPROLOL TARTRATE 50 MG PO TABS
50.0000 mg | ORAL_TABLET | Freq: Two times a day (BID) | ORAL | Status: DC
  Administered 2024-02-22 – 2024-02-23 (×4): 50 mg via ORAL
  Filled 2024-02-22 (×2): qty 2
  Filled 2024-02-22 (×2): qty 1

## 2024-02-22 MED ORDER — BUSPIRONE HCL 5 MG PO TABS
5.0000 mg | ORAL_TABLET | Freq: Two times a day (BID) | ORAL | Status: DC
Start: 2024-02-22 — End: 2024-02-23
  Administered 2024-02-22 – 2024-02-23 (×2): 5 mg via ORAL
  Filled 2024-02-22 (×2): qty 1

## 2024-02-22 MED ORDER — BUSPIRONE HCL 10 MG PO TABS
5.0000 mg | ORAL_TABLET | Freq: Two times a day (BID) | ORAL | Status: DC
  Administered 2024-02-22: 5 mg via ORAL
  Filled 2024-02-22: qty 1

## 2024-02-22 MED ORDER — ATORVASTATIN CALCIUM 80 MG PO TABS
80.0000 mg | ORAL_TABLET | Freq: Every day | ORAL | Status: DC
  Administered 2024-02-22 – 2024-02-23 (×2): 80 mg via ORAL
  Filled 2024-02-22 (×2): qty 1

## 2024-02-22 MED ORDER — ALBUTEROL SULFATE (2.5 MG/3ML) 0.083% IN NEBU: 2.5000 mg | INHALATION_SOLUTION | Freq: Every day | RESPIRATORY_TRACT | Status: DC | PRN

## 2024-02-22 MED ORDER — BUSPIRONE HCL 5 MG PO TABS: 5.0000 mg | ORAL_TABLET | Freq: Two times a day (BID) | ORAL | Status: DC

## 2024-02-22 MED ORDER — LORAZEPAM 0.5 MG PO TABS
0.5000 mg | ORAL_TABLET | Freq: Four times a day (QID) | ORAL | Status: DC | PRN
  Administered 2024-02-22: 0.5 mg via ORAL
  Filled 2024-02-22: qty 1

## 2024-02-22 MED ORDER — VITAMIN E 45 MG (100 UNIT) PO CAPS
400.0000 [IU] | ORAL_CAPSULE | Freq: Every day | ORAL | Status: DC
  Administered 2024-02-22: 400 [IU] via ORAL
  Filled 2024-02-22 (×3): qty 4

## 2024-02-22 MED ORDER — ONDANSETRON HCL 4 MG/2ML IJ SOLN: 4.0000 mg | Freq: Four times a day (QID) | INTRAMUSCULAR | Status: DC | PRN

## 2024-02-22 MED ORDER — FLUTICASONE FUROATE-VILANTEROL 200-25 MCG/ACT IN AEPB
1.0000 | INHALATION_SPRAY | Freq: Every day | RESPIRATORY_TRACT | Status: DC
  Filled 2024-02-22 (×2): qty 28

## 2024-02-22 MED ORDER — ALBUTEROL SULFATE HFA 108 (90 BASE) MCG/ACT IN AERS: 1.0000 | INHALATION_SPRAY | Freq: Every day | RESPIRATORY_TRACT | Status: DC | PRN

## 2024-02-22 MED ORDER — ESCITALOPRAM OXALATE 20 MG PO TABS
20.0000 mg | ORAL_TABLET | Freq: Every day | ORAL | Status: DC
  Administered 2024-02-22 – 2024-02-23 (×2): 20 mg via ORAL
  Filled 2024-02-22: qty 2
  Filled 2024-02-22: qty 1

## 2024-02-22 MED ORDER — AMIODARONE HCL 200 MG PO TABS: 200.0000 mg | ORAL_TABLET | Freq: Every day | ORAL | Status: DC

## 2024-02-22 MED ORDER — NYSTATIN 100000 UNIT/ML MT SUSP
5.0000 mL | Freq: Four times a day (QID) | OROMUCOSAL | Status: DC
  Administered 2024-02-22 – 2024-02-23 (×5): 500000 [IU] via ORAL
  Filled 2024-02-22 (×5): qty 5

## 2024-02-22 MED ORDER — SODIUM CHLORIDE 0.9% FLUSH
3.0000 mL | Freq: Two times a day (BID) | INTRAVENOUS | Status: DC
  Administered 2024-02-22 – 2024-02-23 (×2): 3 mL via INTRAVENOUS

## 2024-02-22 MED ORDER — CLOPIDOGREL BISULFATE 75 MG PO TABS
75.0000 mg | ORAL_TABLET | Freq: Every day | ORAL | Status: DC
  Administered 2024-02-22 – 2024-02-23 (×2): 75 mg via ORAL
  Filled 2024-02-22 (×2): qty 1

## 2024-02-22 MED ORDER — SODIUM CHLORIDE 0.9 % IV SOLN: INTRAVENOUS | Status: AC

## 2024-02-22 MED ORDER — AMIODARONE HCL 200 MG PO TABS
200.0000 mg | ORAL_TABLET | Freq: Two times a day (BID) | ORAL | Status: DC
  Administered 2024-02-22 – 2024-02-23 (×4): 200 mg via ORAL
  Filled 2024-02-22 (×4): qty 1

## 2024-02-22 NOTE — Progress Notes (Signed)
 Echocardiogram 2D Echocardiogram has been performed.  Denise [name] 02/22/2024, 9:33 AM

## 2024-02-22 NOTE — Progress Notes (Signed)
STAT EEG complete - results pending. ? ?

## 2024-02-22 NOTE — Plan of Care (Signed)

## 2024-02-22 NOTE — ED Notes (Signed)
 Assuming care of this pt at this time. Report from [place], [place]

## 2024-02-22 NOTE — Plan of Care (Signed)
 Patient is complaining about headaches 6 out of 10 and record stated that she takes Percocet as needed at home to relieve the pain.  Resumed home medications.   Denise Jessop, MD [facility] 02/22/2024, 9:40 PM

## 2024-02-22 NOTE — Plan of Care (Signed)
 Patient is very anxious, tearing and due to high anxiety blood pressure has been trend up to upper 160s range.  Already patient is on Lexapro .  Resumed home BuSpar  5 mg twice daily and added oral Ativan  as needed.

## 2024-02-22 NOTE — Evaluation (Signed)
 Physical Therapy Evaluation Patient Details Name: Denise [name] MRN: [id number] DOB: 1960/10/17 Today's Date: 02/22/2024  History of Present Illness  Pt is a 63 y.o. presents to ED 7/18 after syncopal fall, with observed tonic-clonic seizure activity  MRI negative for acute findings, EEG no evidence of seizure. PMH left carotid stenosis status post revascularization in 2025 on Eliquis  and Plavix  history of A-fib, COPD GI bleed secondary to AVM chronic diastolic dysfunction, history of aortic stenosis with a mean valve gradient 34 mmHg, chronic hyponatremia discharged from the hospital 2 days prior to admission with A-fib with RVR during this time she also had a GI bleed .  Clinical Impression  PTA pt living with her daughter in single story home with 5 steps to enter. Pt reports independence with mobility, ADLs and iADLs and had just started driving again after L carotid revascularization.  Pt admits concern about her current medical condition noting that she is on lots of medication and is not confident she knows how medications are supposed to be managed. Pt report increased fear of stroke and reports that 2 days ago that her BP had been 210/91 and that she had taken her BP medication to lower it and thinks that she took too much and it caused her to pass out. Pt has some generalized weakness and decreased stability but overall is supervision to independent with mobility. PT will continue to follow to work with pt on anxiety reduction techniques and progressing her mobility. Pt will not have any PT or equipment needs at discharge.        If plan is discharge home, recommend the following: Assistance with cooking/housework;Direct supervision/assist for medications management;Direct supervision/assist for financial management;Assist for transportation   Can travel by private vehicle    Yes    Equipment Recommendations None recommended by PT     Functional Status Assessment Patient has had a  recent decline in their functional status and demonstrates the ability to make significant improvements in function in a reasonable and predictable amount of time.     Precautions / Restrictions Precautions Precautions: Fall Precaution/Restrictions Comments: pt with syncopal event, reports that her BP had been high and she thinks she had taken too much BP medication causing her BP to drop Restrictions Weight Bearing Restrictions Per Provider Order: No      Mobility  Bed Mobility Overal bed mobility: Modified Independent             General bed mobility comments: increased effort to get into ED stretcher    Transfers Overall transfer level: Independent Equipment used: None                    Ambulation/Gait Ambulation/Gait assistance: Supervision Gait Distance (Feet): 100 Feet Assistive device: None Gait Pattern/deviations: Step-through pattern, WFL(Within Functional Limits), Shuffle Gait velocity: slowed Gait velocity interpretation: <1.8 ft/sec, indicate of risk for recurrent falls   General Gait Details: supervision for safety with ambulation back from bathroom in ED to her room, mildly unsteady but no overt LoB        Balance Overall balance assessment: Mild deficits observed, not formally tested                                           Pertinent Vitals/Pain Pain Assessment Pain Assessment: No/denies pain    Home Living Family/patient expects to be discharged to:: Private residence  Living Arrangements: Children Available Help at Discharge: Family Type of Home: House Home Access: Stairs to enter Entrance Stairs-Rails: Doctor, general practice of Steps: 5   Home Layout: One level Home Equipment: BSC/3in1      Prior Function Prior Level of Function : Independent/Modified Independent             Mobility Comments: no AD ADLs Comments: independent had returned to driving after revascularization      Extremity/Trunk Assessment   Upper Extremity Assessment Upper Extremity Assessment: Generalized weakness    Lower Extremity Assessment Lower Extremity Assessment: Generalized weakness    Cervical / Trunk Assessment Cervical / Trunk Assessment: Kyphotic  Communication   Communication Communication: No apparent difficulties    Cognition Arousal: Alert Behavior During Therapy: Anxious   PT - Cognitive impairments: No apparent impairments                         Following commands: Intact       Cueing Cueing Techniques: Verbal cues, Gestural cues     General Comments General comments (skin integrity, edema, etc.): BP with return to room 136/86, VSS, pt report increased anxiety over number of medications, fear of stroke, and managing her multiple medical issues        Assessment/Plan    PT Assessment Patient needs continued PT services  PT Problem List Decreased strength;Decreased balance;Decreased mobility       PT Treatment Interventions Gait training;Stair training;Functional mobility training;Patient/family education;Therapeutic activities;Therapeutic exercise;Balance training    PT Goals (Current goals can be found in the Care Plan section)  Acute Rehab PT Goals PT Goal Formulation: With patient Time For Goal Achievement: 03/07/24 Potential to Achieve Goals: Fair    Frequency Min 2X/week        AM-PAC PT 6 Clicks Mobility  Outcome Measure Help needed turning from your back to your side while in a flat bed without using bedrails?: None Help needed moving from lying on your back to sitting on the side of a flat bed without using bedrails?: None Help needed moving to and from a bed to a chair (including a wheelchair)?: None Help needed standing up from a chair using your arms (e.g., wheelchair or bedside chair)?: None Help needed to walk in hospital room?: None Help needed climbing 3-5 steps with a railing? : A Little 6 Click Score: 23     End of Session   Activity Tolerance: Patient tolerated treatment well Patient left: in bed;with call bell/phone within reach Nurse Communication: Mobility status;Other (comment) (request for clarification of all of her medications and how to use them with changes in her cardiovascular status) PT Visit Diagnosis: Unsteadiness on feet (R26.81)    Time: 9051-8978 PT Time Calculation (min) (ACUTE ONLY): 33 min   Charges:   PT Evaluation $PT Eval Low Complexity: 1 Low PT Treatments $Self Care/Home Management: 8-22 PT General Charges $$ ACUTE PT VISIT: 1 Visit         Samik Balkcom B. Fleeta Lapidus PT, DPT Acute Rehabilitation Services Please use secure chat or  Call Office [phone]   Almarie KATHEE Fleeta [facility] 02/22/2024, 7:17 PM

## 2024-02-22 NOTE — ED Notes (Signed)
 Pt placement called to inform of bed rejection d/t staff calling out. She states she'll call back if they're able to get staff to come in they'll assign her a bed.

## 2024-02-22 NOTE — Procedures (Signed)
 Patient Name: Zakari Couchman  MRN: [id number]  Epilepsy Attending: Arlin MALVA Krebs  Referring Physician/Provider: Sim Emery CROME, MD  Date: 02/22/2024 Duration: 25.34 mins  Patient history: 63yo F with ams. EEG to evaluate for seizure  Level of alertness: Awake  AEDs during EEG study: None  Technical aspects: This EEG study was done with scalp electrodes positioned according to the 10-20 International system of electrode placement. Electrical activity was reviewed with band pass filter of 1-70Hz , sensitivity of 7 uV/mm, display speed of 35mm/sec with a 60Hz  notched filter applied as appropriate. EEG data were recorded continuously and digitally stored.  Video monitoring was available and reviewed as appropriate.  Description: The posterior dominant rhythm consists of 8-9Hz  activity of moderate voltage (25-35 uV) seen predominantly in posterior head regions, symmetric and reactive to eye opening and eye closing. Hyperventilation and photic stimulation were not performed.     IMPRESSION: This study is within normal limits. No seizures or epileptiform discharges were seen throughout the recording.  A normal interictal EEG does not exclude the diagnosis of epilepsy.   Rahima Fleishman O Javell Blackburn

## 2024-02-22 NOTE — Progress Notes (Signed)
 [facility] PROGRESS NOTE    Progress Note  [name]  FMW:[id number] DOB: 1960/10/31 DOA: 02/21/2024 PCP: Gretta Comer POUR, NP     Brief Narrative:   Denise [name] is an 63 y.o. female past medical history significant for left carotid stenosis status post revascularization in 2025 on Eliquis  and Plavix  history of A-fib, COPD GI bleed secondary to AVM chronic diastolic dysfunction, history of aortic stenosis with a mean valve gradient 34 mmHg, chronic hyponatremia discharged from the hospital 2 days prior to admission with A-fib with RVR during this time she also had a GI bleed coming to the ER after syncopal episode.  Apparently tonic-clonic seizures were observed at the scene neurology was consulted recommended MRI and EEG.  Assessment/Plan:   New onset seizure (HCC) MRI of the brain showed no acute findings. EEG showed no evidence of seizures. Recent 2D echo on July 2025 showed an EF of 65% moderate to severe aortic stenosis with a mean gradient of 34 mmHg.  Carotid atherosclerosis, bilateral: Continue aspirin  and Plavix . She has no focal deficits.  Chronic hyponatremia/SIADH: After IV fluids sodium this morning is 133.  Moderate to severe aortic stenosis: With a mean gradient of 34 mmHg. Will need to follow-up with cardiology as an outpatient.  History of paroxysmal atrial fibrillation: Not on anticoagulation due to recent GI bleed. Rate controlled.  Tobacco: Counseling.  Hyperlipidemia: Continue statins.  Iron deficiency anemia: Hemoglobin appears to be stable.  Anxiety and depression: Continue current home meds.   DVT prophylaxis: lovenox  Family Communication:none Status is: Observation The patient remains OBS appropriate and will d/c before 2 midnights.    Code Status:     Code Status Orders  (From admission, onward)           Start     Ordered   02/21/24 2201  Full code  Continuous       Question:  By:  Answer:   Consent: discussion documented in EHR   02/21/24 2203           Code Status History     Date Active Date Inactive Code Status Order ID Comments User Context   02/18/2024 0133 02/19/2024 2101 Full Code [id number]  Shona Terry SAILOR, DO ED   02/12/2024 1425 02/14/2024 1854 Full Code [id number]  [place] Lucie JINNY DEVONNA Inpatient   10/26/2023 1235 10/31/2023 1919 Full Code [id number]  Kathrin Mignon DASEN, MD Inpatient   12/22/2019 2118 12/24/2019 1919 Full Code [id number]  Silvester Ales, MD ED   03/30/2012 0443 03/31/2012 1914 Full Code [id number]  Gabino Sciara, RN Inpatient         IV Access:   Peripheral IV   Procedures and diagnostic studies:      Medical Consultants:   None.   Subjective:    Danamarie Minami no complaints she relates no loss of control of sphincter no stooling in herself no biting of her tongue.  Objective:    Vitals:   02/21/24 2330 02/22/24 0000 02/22/24 0213 02/22/24 0400  BP: 117/70 125/71 (!) 145/77 126/73  Pulse: 75 81 89 66  Resp: 17 (!) 21  (!) 25  Temp:    98.6 F (37 C)  TempSrc:    Oral  SpO2: 90% 91%  94%   SpO2: 94 %  No intake or output data in the 24 hours ending 02/22/24 0642 There were no vitals filed for this visit.  Exam: General exam: In no acute distress. Respiratory system: Good air movement and clear  to auscultation. Cardiovascular system: S1 & S2 heard, RRR. No JVD. Gastrointestinal system: Abdomen is nondistended, soft and nontender.  Extremities: No pedal edema. Skin: No rashes, lesions or ulcers Psychiatry: Judgement and insight appear normal. Mood & affect appropriate.    Data Reviewed:    Labs: Basic Metabolic Panel: Recent Labs  Lab 02/16/24 1429 02/17/24 1544 02/17/24 2156 02/18/24 0436 02/19/24 0804 02/21/24 1545 02/21/24 1554 02/22/24 0448  NA 130*   < > 131* 133* 134* 128* 129* 133*  K 4.1   < > 3.7 3.7 4.0 4.1 4.1 3.9  CL 99   < > 99 99 101 96* 96* 101  CO2 22   < > 22 25 23  20*   --  22  GLUCOSE 119*   < > 104* 106* 109* 142* 146* 95  BUN 17   < > 5* 5* 6* 6* 6* <5*  CREATININE 0.72   < > 0.46 0.56 0.55 0.71 0.60 0.71  CALCIUM  9.3   < > 9.0 9.1 9.2 8.9  --  9.0  MG 1.8  --   --  2.0 1.9  --   --   --   PHOS  --   --   --  3.3  --   --   --   --    < > = values in this interval not displayed.   GFR Estimated Creatinine Clearance: 69.4 mL/min (by C-G formula based on SCr of 0.71 mg/dL). Liver Function Tests: Recent Labs  Lab 02/16/24 1429 02/17/24 1544 02/19/24 0804 02/21/24 1545 02/22/24 0448  AST 23 26 17 25 22   ALT 19 17 17 17 18   ALKPHOS 78 76 74 80 74  BILITOT 0.7 0.9 0.6 1.0 1.3*  PROT 6.3* 6.3* 6.1* 6.6 6.0*  ALBUMIN  3.5 3.5 3.5 3.5 3.3*   No results for input(s): LIPASE, AMYLASE in the last 168 hours. No results for input(s): AMMONIA in the last 168 hours. Coagulation profile Recent Labs  Lab 02/18/24 0014 02/21/24 1545  INR 1.0 1.0   COVID-19 Labs  No results for input(s): DDIMER, FERRITIN, LDH, CRP in the last 72 hours.  Lab Results  Component Value Date   SARSCOV2NAA NEGATIVE 12/22/2019    CBC: Recent Labs  Lab 02/16/24 1429 02/17/24 1544 02/17/24 2156 02/18/24 0436 02/19/24 0804 02/21/24 1545 02/21/24 1554 02/22/24 0448  WBC 12.4* 12.2* 12.2* 11.7* 9.8 13.2*  --  10.4  NEUTROABS 9.9* 9.4* 8.5*  --  7.3 11.1*  --   --   HGB 13.8 13.6 13.6 13.1 13.0 13.8 14.6 13.2  HCT 41.3 39.8 39.1 39.6 37.9 40.5 43.0 39.7  MCV 93.4 92.1 91.6 94.7 92.2 94.4  --  96.1  PLT 328 343 349 332 333 376  --  343   Cardiac Enzymes: No results for input(s): CKTOTAL, CKMB, CKMBINDEX, TROPONINI in the last 168 hours. BNP (last 3 results) No results for input(s): PROBNP in the last 8760 hours. CBG: Recent Labs  Lab 02/22/24 0447  GLUCAP 96   D-Dimer: No results for input(s): DDIMER in the last 72 hours. Hgb A1c: No results for input(s): HGBA1C in the last 72 hours. Lipid Profile: No results for input(s):  CHOL, HDL, LDLCALC, TRIG, CHOLHDL, LDLDIRECT in the last 72 hours. Thyroid  function studies: Recent Labs    02/22/24 0448  TSH 1.551   Anemia work up: No results for input(s): VITAMINB12, FOLATE, FERRITIN, TIBC, IRON, RETICCTPCT in the last 72 hours. Sepsis Labs: Recent Labs  Lab 02/18/24 [id number]  02/19/24 0804 02/21/24 1545 02/22/24 0448  WBC 11.7* 9.8 13.2* 10.4   Microbiology No results found for this or any previous visit (from the past 240 hours).   Medications:    amiodarone   200 mg Oral BID   Followed by   NOREEN ON 03/04/2024] amiodarone   200 mg Oral Daily   vitamin C   2,000 mg Oral Daily   atorvastatin   80 mg Oral Daily   clopidogrel   75 mg Oral Daily   cyanocobalamin   1,000 mcg Oral Daily   enoxaparin  (LOVENOX ) injection  40 mg Subcutaneous Q24H   escitalopram   20 mg Oral Daily   fluticasone  furoate-vilanterol  1 puff Inhalation Daily   metoprolol  tartrate  50 mg Oral BID   nystatin   5 mL Oral QID   pantoprazole   40 mg Oral BID   pyridoxine   100 mg Oral Daily   sodium chloride  flush  3 mL Intravenous Q12H   sodium chloride   1 g Oral BID WC   vitamin E   400 Units Oral Daily   Continuous Infusions:  sodium chloride  40 mL/hr at 02/22/24 0213      LOS: 0 days   Erle Odell Castor  [facility]  02/22/2024, 6:42 AM

## 2024-02-23 DIAGNOSIS — R569 Unspecified convulsions: Secondary | ICD-10-CM | POA: Diagnosis not present

## 2024-02-23 DIAGNOSIS — J449 Chronic obstructive pulmonary disease, unspecified: Secondary | ICD-10-CM

## 2024-02-23 DIAGNOSIS — I6523 Occlusion and stenosis of bilateral carotid arteries: Secondary | ICD-10-CM | POA: Diagnosis not present

## 2024-02-23 DIAGNOSIS — F419 Anxiety disorder, unspecified: Secondary | ICD-10-CM

## 2024-02-23 DIAGNOSIS — F32A Depression, unspecified: Secondary | ICD-10-CM

## 2024-02-23 LAB — BASIC METABOLIC PANEL WITH GFR
Anion gap: 10 (ref 5–15)
BUN: <5 mg/dL — ABNORMAL LOW (ref 8–23)
CO2: 24 mmol/L (ref 22–32)
Calcium: 9.1 mg/dL (ref 8.9–10.3)
Chloride: 101 mmol/L (ref 98–111)
Creatinine, Ser: 0.7 mg/dL (ref 0.44–1.00)
GFR, Estimated: >60 mL/min (ref >60)
Glucose, Bld: 95 mg/dL (ref 70–99)
Potassium: 4 mmol/L (ref 3.5–5.1)
Sodium: 135 mmol/L (ref 135–145)

## 2024-02-23 LAB — CBC WITH DIFFERENTIAL/PLATELET
Abs Immature Granulocytes: 0.05 K/uL (ref 0.00–0.07)
Basophils Absolute: 0 K/uL (ref 0.0–0.1)
Basophils Relative: 0 %
Eosinophils Absolute: 0.3 K/uL (ref 0.0–0.5)
Eosinophils Relative: 3 %
HCT: 37.9 % (ref 36.0–46.0)
Hemoglobin: 12.8 g/dL (ref 12.0–15.0)
Immature Granulocytes: 1 %
Lymphocytes Relative: 13 %
Lymphs Abs: 1.2 K/uL (ref 0.7–4.0)
MCH: 32.2 pg (ref 26.0–34.0)
MCHC: 33.8 g/dL (ref 30.0–36.0)
MCV: 95.2 fL (ref 80.0–100.0)
Monocytes Absolute: 0.9 K/uL (ref 0.1–1.0)
Monocytes Relative: 10 %
Neutro Abs: 6.5 K/uL (ref 1.7–7.7)
Neutrophils Relative %: 73 %
Platelets: 367 K/uL (ref 150–400)
RBC: 3.98 MIL/uL (ref 3.87–5.11)
RDW: 19.9 % — ABNORMAL HIGH (ref 11.5–15.5)
WBC: 8.9 K/uL (ref 4.0–10.5)
nRBC: 0 % (ref 0.0–0.2)

## 2024-02-23 LAB — PHOSPHORUS: Phosphorus: 3.8 mg/dL (ref 2.5–4.6)

## 2024-02-23 LAB — GLUCOSE, CAPILLARY: Glucose-Capillary: 96 mg/dL (ref 70–99)

## 2024-02-23 LAB — BRAIN NATRIURETIC PEPTIDE: B Natriuretic Peptide: 132.4 pg/mL — ABNORMAL HIGH (ref 0.0–100.0)

## 2024-02-23 LAB — MAGNESIUM: Magnesium: 2 mg/dL (ref 1.7–2.4)

## 2024-02-23 MED ORDER — LISINOPRIL 20 MG PO TABS
20.0000 mg | ORAL_TABLET | Freq: Every day | ORAL | Status: DC
  Administered 2024-02-23: 20 mg via ORAL
  Filled 2024-02-23: qty 1

## 2024-02-23 NOTE — Discharge Instructions (Signed)
Seizure precautions: °Per [place] DMV statutes, patients with seizures are not allowed to drive until they have been seizure-free for six months and cleared by a physician  °  °Use caution when using heavy equipment or power tools. Avoid working on ladders or at heights. Take showers instead of baths. Ensure the water temperature is not too high on the home water heater. Do not go swimming alone. Do not lock yourself in a room alone (i.e. bathroom). When caring for infants or small children, sit down when holding, feeding, or changing them to minimize risk of injury to the child in the event you have a seizure. Maintain good sleep hygiene. Avoid alcohol.  °  °If patient has another seizure, call 911 and bring them back to the ED if: °A.  The seizure lasts longer than 5 minutes.      °B.  The patient doesn't wake shortly after the seizure or has new problems such as difficulty seeing, speaking or moving following the seizure °C.  The patient was injured during the seizure °D.  The patient has a temperature over 102 F (39C) °E.  The patient vomited during the seizure and now is having trouble breathing °   °During the Seizure °  °- First, ensure adequate ventilation and place patients on the floor on their left side  °Loosen clothing around the neck and ensure the airway is patent. If the patient is clenching the teeth, do not force the mouth open with any object as this can cause severe damage °- Remove all items from the surrounding that can be hazardous. The patient may be oblivious to what's happening and may not even know what he or she is doing. °If the patient is confused and wandering, either gently guide him/her away and block access to outside areas °- Reassure the individual and be comforting °- Call 911. In most cases, the seizure ends before EMS arrives. However, there are cases when seizures may last over 3 to 5 minutes. Or the individual may have developed breathing difficulties or severe  injuries. If a pregnant patient or a person with diabetes develops a seizure, it is prudent to call an ambulance. °- Finally, if the patient does not regain full consciousness, then call EMS. Most patients will remain confused for about 45 to 90 minutes after a seizure, so you must use judgment in calling for help. °- Avoid restraints but make sure the patient is in a bed with padded side rails °- Place the individual in a lateral position with the neck slightly flexed; this will help the saliva drain from the mouth and prevent the tongue from falling backward °- Remove all nearby furniture and other hazards from the area °- Provide verbal assurance as the individual is regaining consciousness °- Provide the patient with privacy if possible °- Call for help and start treatment as ordered by the caregiver °  ° After the Seizure (Postictal Stage) °  °After a seizure, most patients experience confusion, fatigue, muscle pain and/or a headache. Thus, one should permit the individual to sleep. For the next few days, reassurance is essential. Being calm and helping reorient the person is also of importance. °  °Most seizures are painless and end spontaneously. Seizures are not harmful to others but can lead to complications such as stress on the lungs, brain and the heart. Individuals with prior lung problems may develop labored breathing and respiratory distress.  °  °

## 2024-02-23 NOTE — Discharge Summary (Signed)
 PATIENT DETAILS Name: Denise [name] Age: 63 y.o. Sex: female Date of Birth: April 14, 1961 MRN: [id number]. Admitting Physician: Emery LITTIE Fuss, MD ERE:Rojmx, Comer POUR, NP  Admit Date: 02/21/2024 Discharge date: 02/23/2024  Recommendations for Outpatient Follow-up:  Follow up with PCP in 1-2 weeks Please obtain CMP/CBC in one week  Admitted From:  Home  Disposition: Home   Discharge Condition: good  CODE STATUS:   Code Status: Full Code   Diet recommendation:  Diet Order             Diet - low sodium heart healthy           Diet Heart Room service appropriate? Yes; Fluid consistency: Thin  Diet effective now                    Brief Summary: 63 year old with recent hospitalizations x 2 for left TCAR) for A-fib/GI bleed-presented to the hospital for evaluation of syncope versus seizure.    Per daughter-on day of hospitalization patient started having ringing sounds in her ear (has ringing when blood pressure is high)-claims that she took an extra tablet of amlodipine  (recently taken off amlodipine )-in addition to her usual antihypertensives (metoprolol /lisinopril ).  She then proceeded to have syncope-with some seizure-like activity.  No tongue bite or urinary incontinence.  Per daughter who witnessed this-she was confused for several minutes-and remained confused still EMS put her in the truck.  She was then brought to the ED and admitted to the hospitalist service.  Significant studies 7/18>> CTA head/neck: Left common carotid artery stent patent, right common carotid artery occlusion 7/18>> MRI brain: No acute abnormalities 7/19>> EEG: No seizures.  Brief Hospital Course: Seizure versus convulsive syncope Unclear whether this is her first episode of seizure or convulsive syncope related hypotension (used amlodipine  when recently discontinued) Repeat echo stable-telemetry without any arrhythmias MRI brain/EEG stable Unclear exactly what happened but  even if this was a first episode of seizures-no role for AEDs given negative MRI/EEG. Discussed with patient and then with her daughter over the phone-stable for discharge-continue close monitoring in the outpatient setting-if symptoms recur again then we can consider adding AED or outpatient cardiac monitor ( Discussed with neurologist Dr. Matthews over the phone). Standard seizure precautions will be ordered on discharge. Ambulatory referral to follow-up with [facility] neurology placed.  B/L carotid artery disease-s/p left TCAR recently Continue antiplatelet/statin  HTN BP slightly on the higher side Continue metoprolol  Resume lisinopril  Amlodipine  remains on hold-I have asked patient/daughter-to touch base with PCP for further optimization.  History of PAF Continue amiodarone  as previous Currently not on anticoagulation due to recent GI bleed.  Anxiety/depression Stable Continue Lexapro /BuSpar   6 x 1.2 cm nodule in the lingula Seen incidentally on CT chest-appears to be related chronic/known issue-has had prior CT/bronchoscopy done in the past. Continue outpatient monitoring with pulmonology in the past-prior workup has been negative.  Discharge Diagnoses:  Principal Problem:   Seizure (HCC) Active Problems:   Carotid atherosclerosis, bilateral   COPD (chronic obstructive pulmonary disease) (HCC)   AVM (arteriovenous malformation) of colon   SIADH (syndrome of inappropriate ADH production) (HCC)   Essential hypertension   Tobacco abuse   Anxiety and depression   Hyperlipidemia   CAD (coronary artery disease)   Carotid artery disease (HCC)   Discharge Instructions:  Activity:  As tolerated   Discharge Instructions     Ambulatory referral to Neurology   Complete by: As directed    An appointment is requested in approximately:  4 weeks   Call MD for:   Complete by: As directed    Recurrent seizure-like activity or loss of consciousness   Diet - low sodium heart  healthy   Complete by: As directed    Discharge instructions   Complete by: As directed    Follow with Primary MD  Gretta Comer POUR, NP in 1-2 weeks  Please get a complete blood count and chemistry panel checked by your Primary MD at your next visit, and again as instructed by your Primary MD.  Get Medicines reviewed and adjusted: Please take all your medications with you for your next visit with your Primary MD  Laboratory/radiological data: Please request your Primary MD to go over all hospital tests and procedure/radiological results at the follow up, please ask your Primary MD to get all Hospital records sent to his/her office.  In some cases, they will be blood work, cultures and biopsy results pending at the time of your discharge. Please request that your primary care M.D. follows up on these results.  Also Note the following: If you experience worsening of your admission symptoms, develop shortness of breath, life threatening emergency, suicidal or homicidal thoughts you must seek medical attention immediately by calling 911 or calling your MD immediately  if symptoms less severe.  You must read complete instructions/literature along with all the possible adverse reactions/side effects for all the Medicines you take and that have been prescribed to you. Take any new Medicines after you have completely understood and accpet all the possible adverse reactions/side effects.   Do not drive when taking Pain medications or sleeping medications (Benzodaizepines)  Do not take more than prescribed Pain, Sleep and Anxiety Medications. It is not advisable to combine anxiety,sleep and pain medications without talking with your primary care practitioner  Special Instructions: If you have smoked or chewed Tobacco  in the last 2 yrs please stop smoking, stop any regular Alcohol  and or any Recreational drug use.  Wear Seat belts while driving.  Please note: You were cared for by a  hospitalist during your hospital stay. Once you are discharged, your primary care physician will handle any further medical issues. Please note that NO REFILLS for any discharge medications will be authorized once you are discharged, as it is imperative that you return to your primary care physician (or establish a relationship with a primary care physician if you do not have one) for your post hospital discharge needs so that they can reassess your need for medications and monitor your lab values.     Seizure precautions: Per [place]  DMV statutes, patients with seizures are not allowed to drive until they have been seizure-free for six months and cleared by a physician    Use caution when using heavy equipment or power tools. Avoid working on ladders or at heights. Take showers instead of baths. Ensure the water temperature is not too high on the home water heater. Do not go swimming alone. Do not lock yourself in a room alone (i.e. bathroom). When caring for infants or small children, sit down when holding, feeding, or changing them to minimize risk of injury to the child in the event you have a seizure. Maintain good sleep hygiene. Avoid alcohol.    If patient has another seizure, call 911 and bring them back to the ED if: A.  The seizure lasts longer than 5 minutes.      B.  The patient doesn't wake shortly after the seizure or has  new problems such as difficulty seeing, speaking or moving following the seizure C.  The patient was injured during the seizure D.  The patient has a temperature over 102 F (39C) E.  The patient vomited during the seizure and now is having trouble breathing    During the Seizure   - First, ensure adequate ventilation and place patients on the floor on their left side  Loosen clothing around the neck and ensure the airway is patent. If the patient is clenching the teeth, do not force the mouth open with any object as this can cause severe damage - Remove all  items from the surrounding that can be hazardous. The patient may be oblivious to what's happening and may not even know what he or she is doing. If the patient is confused and wandering, either gently guide him/her away and block access to outside areas - Reassure the individual and be comforting - Call 911. In most cases, the seizure ends before EMS arrives. However, there are cases when seizures may last over 3 to 5 minutes. Or the individual may have developed breathing difficulties or severe injuries. If a pregnant patient or a person with diabetes develops a seizure, it is prudent to call an ambulance. - Finally, if the patient does not regain full consciousness, then call EMS. Most patients will remain confused for about 45 to 90 minutes after a seizure, so you must use judgment in calling for help. - Avoid restraints but make sure the patient is in a bed with padded side rails - Place the individual in a lateral position with the neck slightly flexed; this will help the saliva drain from the mouth and prevent the tongue from falling backward - Remove all nearby furniture and other hazards from the area - Provide verbal assurance as the individual is regaining consciousness - Provide the patient with privacy if possible - Call for help and start treatment as ordered by the caregiver    After the Seizure (Postictal Stage)   After a seizure, most patients experience confusion, fatigue, muscle pain and/or a headache. Thus, one should permit the individual to sleep. For the next few days, reassurance is essential. Being calm and helping reorient the person is also of importance.   Most seizures are painless and end spontaneously. Seizures are not harmful to others but can lead to complications such as stress on the lungs, brain and the heart. Individuals with prior lung problems may develop labored breathing and respiratory distress.    Increase activity slowly   Complete by: As directed    No  wound care   Complete by: As directed       Allergies as of 02/23/2024   No Known Allergies      Medication List     PAUSE taking these medications    ferrous sulfate  325 (65 FE) MG tablet Wait to take this until your doctor or other care provider tells you to start again. Take 650 mg by mouth daily with breakfast.       TAKE these medications    acetaminophen  500 MG tablet Commonly known as: TYLENOL  Take 2 tablets (1,000 mg total) by mouth every 8 (eight) hours as needed. What changed:  when to take this reasons to take this   albuterol  108 (90 Base) MCG/ACT inhaler Commonly known as: VENTOLIN  HFA INHALE 2 PUFFS INTO THE LUNGS EVERY 4-6 HOURS AS NEEDED FOR TIGHTNESS/WHEEZING What changed:  how much to take how to take this when  to take this reasons to take this additional instructions   amiodarone  200 MG tablet Commonly known as: PACERONE  Take 1 tablet (200 mg total) by mouth 2 (two) times daily for 14 days, THEN 1 tablet (200 mg total) daily. Start taking on: February 19, 2024   atorvastatin  80 MG tablet Commonly known as: LIPITOR  TAKE 1 TABLET (80 MG TOTAL) BY MOUTH DAILY FOR CHOLESTEROL   busPIRone  5 MG tablet Commonly known as: BUSPAR  Take 1 tablet (5 mg total) by mouth 2 (two) times daily. For anxiety   clopidogrel  75 MG tablet Commonly known as: PLAVIX  Take 1 tablet (75 mg total) by mouth daily.   cyanocobalamin  1000 MCG tablet Commonly known as: VITAMIN B12 Take 1,000 mcg by mouth daily.   escitalopram  20 MG tablet Commonly known as: LEXAPRO  TAKE 1 TABLET BY MOUTH DAILY FOR ANXIETY   lisinopril  20 MG tablet Commonly known as: ZESTRIL  Take 1 tablet (20 mg total) by mouth daily. for blood pressure.   metoprolol  tartrate 50 MG tablet Commonly known as: LOPRESSOR  Take 1 tablet (50 mg total) by mouth 2 (two) times daily.   nystatin  100000 UNIT/ML suspension Commonly known as: MYCOSTATIN  Take 5 mLs (500,000 Units total) by mouth 4 (four) times  daily for 10 days.   oxyCODONE -acetaminophen  5-325 MG tablet Commonly known as: PERCOCET/ROXICET Take 1 tablet by mouth every 6 (six) hours as needed for moderate pain (pain score 4-6). What changed: when to take this   pantoprazole  40 MG tablet Commonly known as: Protonix  Take 1 tablet (40 mg total) by mouth 2 (two) times daily.   pyridoxine  100 MG tablet Commonly known as: B-6 Take 100 mg by mouth daily.   sodium chloride  1 g tablet TAKE 1 TABLET (1 G TOTAL) BY MOUTH 2 (TWO) TIMES DAILY WITH A MEAL. TO INCREASE SODIUM What changed: additional instructions   vitamin C  1000 MG tablet Take 2,000 mg by mouth daily.   vitamin E  180 MG (400 UNITS) capsule Take 400 Units by mouth daily.   Wixela Inhub 250-50 MCG/ACT Aepb Generic drug: fluticasone -salmeterol INHALE 1 PUFF INTO THE LUNGS IN THE MORNING AND AT BEDTIME. What changed: additional instructions        Follow-up Information     Gretta Comer POUR, NP. Schedule an appointment as soon as possible for a visit in 1 week(s).   Specialty: Internal Medicine Contact information: [address] [place] [address] [phone]         [facility] Follow up.   Why: Office will call with date/time, If you dont hear from them,please give them a call Contact information: [address] [place], [address] [place]  [address] [phone]               No Known Allergies   TODAY-DAY OF DISCHARGE:  Subjective:   Denise [name] today has no headache,no chest abdominal pain,no new weakness tingling or numbness, feels much better wants to go home today.   Objective:   Blood pressure (!) 163/81, pulse 67, temperature 98.5 F (36.9 C), temperature source Oral, resp. rate 16, weight 72.6 kg, SpO2 93%.  Intake/Output Summary (Last 24 hours) at 02/23/2024 0900 Last data filed at 02/22/2024 2200 Gross per 24 hour  Intake --  Output 250 ml  Net -250 ml   Filed Weights   02/23/24 0451   Weight: 72.6 kg    Exam: Awake Alert, Oriented *3, No new F.N deficits, Normal affect [place].AT,PERRAL Supple Neck,No JVD, No cervical lymphadenopathy appriciated.  Symmetrical Chest wall movement, Good air movement bilaterally, CTAB RRR,No Gallops,Rubs or new Murmurs, No Parasternal Heave +ve B.Sounds, Abd Soft, Non tender, No organomegaly appriciated, No rebound -guarding or rigidity. No Cyanosis, Clubbing or edema, No new Rash or bruise   PERTINENT RADIOLOGIC STUDIES:    PERTINENT LAB RESULTS: CBC: Recent Labs    02/22/24 0448 02/23/24 0650  WBC 10.4 8.9  HGB 13.2 12.8  HCT 39.7 37.9  PLT 343 367   CMET CMP     Component Value Date/Time   NA 135 02/23/2024 0650   NA 132 (L) 09/06/2023 1553   K 4.0 02/23/2024 0650   CL 101 02/23/2024 0650   CO2 24 02/23/2024 0650   GLUCOSE 95 02/23/2024 0650   BUN <5 (L) 02/23/2024 0650   BUN 11 09/06/2023 1553   CREATININE 0.70 02/23/2024 0650   CREATININE 0.54 11/29/2023 1516   CALCIUM  9.1 02/23/2024 0650   PROT 6.0 (L) 02/22/2024 0448   ALBUMIN  3.3 (L) 02/22/2024 0448   AST 22 02/22/2024 0448   ALT 18 02/22/2024 0448   ALKPHOS 74 02/22/2024 0448   BILITOT 1.3 (H) 02/22/2024 0448   GFR 98.78 11/21/2023 1254   EGFR 104 11/29/2023 1516   EGFR 90 09/06/2023 1553   GFRNONAA >60 02/23/2024 0650    GFR Estimated Creatinine Clearance: 69.6 mL/min (by C-G formula based on SCr of 0.7 mg/dL). No results for input(s): LIPASE, AMYLASE in the last 72 hours. No results for input(s): CKTOTAL, CKMB, CKMBINDEX, TROPONINI in the last 72 hours. Invalid input(s): POCBNP No results for input(s): DDIMER in the last 72 hours. No results for input(s): HGBA1C in the last 72 hours. No results for input(s): CHOL, HDL, LDLCALC, TRIG, CHOLHDL, LDLDIRECT in the last 72 hours. Recent Labs    02/22/24 0448  TSH 1.551   No results for input(s): VITAMINB12, FOLATE, FERRITIN, TIBC, IRON, RETICCTPCT in the  last 72 hours. Coags: Recent Labs    02/21/24 1545  INR 1.0   Microbiology: No results found for this or any previous visit (from the past 240 hours).  FURTHER DISCHARGE INSTRUCTIONS:  Get Medicines reviewed and adjusted: Please take all your medications with you for your next visit with your Primary MD  Laboratory/radiological data: Please request your Primary MD to go over all hospital tests and procedure/radiological results at the follow up, please ask your Primary MD to get all Hospital records sent to his/her office.  In some cases, they will be blood work, cultures and biopsy results pending at the time of your discharge. Please request that your primary care M.D. goes through all the records of your hospital data and follows up on these results.  Also Note the following: If you experience worsening of your admission symptoms, develop shortness of breath, life threatening emergency, suicidal or homicidal thoughts you must seek medical attention immediately by calling 911 or calling your MD immediately  if symptoms less severe.  You must read complete instructions/literature along with all the possible adverse reactions/side effects for all the Medicines you take and that have been prescribed to you. Take any new Medicines after you have completely understood and accpet all the possible adverse reactions/side effects.   Do not drive when taking Pain medications or sleeping medications (Benzodaizepines)  Do not take more than prescribed Pain, Sleep and Anxiety Medications. It is not advisable to combine anxiety,sleep and pain medications without talking with your primary care practitioner  Special Instructions: If you have smoked or chewed Tobacco  in the  last 2 yrs please stop smoking, stop any regular Alcohol  and or any Recreational drug use.  Wear Seat belts while driving.  Please note: You were cared for by a hospitalist during your hospital stay. Once you are discharged,  your primary care physician will handle any further medical issues. Please note that NO REFILLS for any discharge medications will be authorized once you are discharged, as it is imperative that you return to your primary care physician (or establish a relationship with a primary care physician if you do not have one) for your post hospital discharge needs so that they can reassess your need for medications and monitor your lab values.  Total Time spent coordinating discharge including counseling, education and face to face time equals greater than 30 minutes.  Signed: Magdalynn Davilla 02/23/2024 9:00 AM

## 2024-02-23 NOTE — Plan of Care (Signed)
 Problem: Education: Goal: Knowledge of condition and prescribed therapy will improve 02/23/2024 1047 by Zackary Deronda HERO, RN Outcome: Adequate for Discharge 02/23/2024 1046 by Zackary Deronda HERO, RN Outcome: Adequate for Discharge   Problem: Cardiac: Goal: Will achieve and/or maintain adequate cardiac output 02/23/2024 1047 by Zackary Deronda HERO, RN Outcome: Adequate for Discharge 02/23/2024 1046 by Zackary Deronda HERO, RN Outcome: Adequate for Discharge   Problem: Physical Regulation: Goal: Complications related to the disease process, condition or treatment will be avoided or minimized 02/23/2024 1047 by Zackary Deronda HERO, RN Outcome: Adequate for Discharge 02/23/2024 1046 by Zackary Deronda HERO, RN Outcome: Adequate for Discharge   Problem: Education: Goal: Knowledge of General Education information will improve Description: Including pain rating scale, medication(s)/side effects and non-pharmacologic comfort measures 02/23/2024 1047 by Zackary Deronda HERO, RN Outcome: Adequate for Discharge 02/23/2024 1046 by Zackary Deronda HERO, RN Outcome: Adequate for Discharge   Problem: Health Behavior/Discharge Planning: Goal: Ability to manage health-related needs will improve 02/23/2024 1047 by Zackary Deronda HERO, RN Outcome: Adequate for Discharge 02/23/2024 1046 by Zackary Deronda HERO, RN Outcome: Adequate for Discharge   Problem: Clinical Measurements: Goal: Ability to maintain clinical measurements within normal limits will improve 02/23/2024 1047 by Zackary Deronda HERO, RN Outcome: Adequate for Discharge 02/23/2024 1046 by Zackary Deronda HERO, RN Outcome: Adequate for Discharge Goal: Will remain free from infection 02/23/2024 1047 by Zackary Deronda HERO, RN Outcome: Adequate for Discharge 02/23/2024 1046 by Zackary Deronda HERO, RN Outcome: Adequate for Discharge Goal: Diagnostic test results will improve 02/23/2024 1047 by Zackary Deronda HERO, RN Outcome: Adequate for  Discharge 02/23/2024 1046 by Zackary Deronda HERO, RN Outcome: Adequate for Discharge Goal: Respiratory complications will improve 02/23/2024 1047 by Zackary Deronda HERO, RN Outcome: Adequate for Discharge 02/23/2024 1046 by Zackary Deronda HERO, RN Outcome: Adequate for Discharge Goal: Cardiovascular complication will be avoided 02/23/2024 1047 by Zackary Deronda HERO, RN Outcome: Adequate for Discharge 02/23/2024 1046 by Zackary Deronda HERO, RN Outcome: Adequate for Discharge   Problem: Activity: Goal: Risk for activity intolerance will decrease 02/23/2024 1047 by Zackary Deronda HERO, RN Outcome: Adequate for Discharge 02/23/2024 1046 by Zackary Deronda HERO, RN Outcome: Adequate for Discharge   Problem: Nutrition: Goal: Adequate nutrition will be maintained 02/23/2024 1047 by Zackary Deronda HERO, RN Outcome: Adequate for Discharge 02/23/2024 1046 by Zackary Deronda HERO, RN Outcome: Adequate for Discharge   Problem: Coping: Goal: Level of anxiety will decrease 02/23/2024 1047 by Zackary Deronda HERO, RN Outcome: Adequate for Discharge 02/23/2024 1046 by Zackary Deronda HERO, RN Outcome: Adequate for Discharge   Problem: Elimination: Goal: Will not experience complications related to bowel motility 02/23/2024 1047 by Zackary Deronda HERO, RN Outcome: Adequate for Discharge 02/23/2024 1046 by Zackary Deronda HERO, RN Outcome: Adequate for Discharge Goal: Will not experience complications related to urinary retention 02/23/2024 1047 by Zackary Deronda HERO, RN Outcome: Adequate for Discharge 02/23/2024 1046 by Zackary Deronda HERO, RN Outcome: Adequate for Discharge   Problem: Pain Managment: Goal: General experience of comfort will improve and/or be controlled 02/23/2024 1047 by Zackary Deronda HERO, RN Outcome: Adequate for Discharge 02/23/2024 1046 by Zackary Deronda HERO, RN Outcome: Adequate for Discharge   Problem: Safety: Goal: Ability to remain free from injury will improve 02/23/2024 1047 by  Zackary Deronda HERO, RN Outcome: Adequate for Discharge 02/23/2024 1046 by Zackary Deronda HERO, RN Outcome: Adequate for Discharge   Problem: Skin Integrity: Goal: Risk for impaired skin integrity will decrease 02/23/2024 1047 by Zackary Deronda HERO, RN Outcome: Adequate for Discharge  02/23/2024 1046 by Zackary Deronda HERO, RN Outcome: Adequate for Discharge

## 2024-02-24 ENCOUNTER — Other Ambulatory Visit: Payer: Self-pay

## 2024-02-26 ENCOUNTER — Ambulatory Visit (INDEPENDENT_AMBULATORY_CARE_PROVIDER_SITE_OTHER): Admitting: Primary Care

## 2024-02-26 ENCOUNTER — Encounter: Payer: Self-pay | Admitting: Primary Care

## 2024-02-26 VITALS — BP 162/98 | HR 61 | Temp 97.7°F | Ht 63.0 in | Wt 158.0 lb

## 2024-02-26 DIAGNOSIS — I10 Essential (primary) hypertension: Secondary | ICD-10-CM

## 2024-02-26 DIAGNOSIS — I4892 Unspecified atrial flutter: Secondary | ICD-10-CM

## 2024-02-26 DIAGNOSIS — K922 Gastrointestinal hemorrhage, unspecified: Secondary | ICD-10-CM

## 2024-02-26 DIAGNOSIS — R569 Unspecified convulsions: Secondary | ICD-10-CM

## 2024-02-26 NOTE — Assessment & Plan Note (Signed)
 Above goal.   Increase lisinopril  to 30 mg daily. She will monitor BP and update if readings remain above goal. Consider resuming amlodipine  at 5 mg if needed.  Continue metoprolol  tartrate 50 mg twice daily.

## 2024-02-26 NOTE — Patient Instructions (Signed)
 Follow-up with cardiology and vascular services as scheduled.  You should be receiving a phone call regarding the neurology appointment.  We increased your dose of lisinopril  to 30 mg daily.  Start taking 30 mg once daily for blood pressure.  Please let me know when you need refills.  It was a pleasure to see you today!

## 2024-02-26 NOTE — Progress Notes (Unsigned)
 Cardiology Office Note    Date:  02/27/2024  ID:  Denise [name], DOB 04-04-1961, MRN [id number] PCP:  Gretta Comer POUR, NP  Cardiologist:  Shelda Bruckner, MD  Electrophysiologist:  None   Chief Complaint: Follow up for atrial fibrillation   History of Present Illness: .    Denise [name] is a 63 y.o. female with visit-pertinent history of nonobstructive CAD per coronary CTA in 10/2023, hyperdynamic LV systolic function with a EF of 70 to 75% on echo on 10/15/2023, carotid artery disease, hypertension, hyperlipidemia, obesity history of cigarette smoking, SIADH.  Denise [name] was originally seen by Dr. Alveta in 08/2023 for preoperative evaluation in preparation for carotid surgery.  Coronary CTA in 10/2023 with CAC 659, 90th percentile, severe mixed plaque in the proximal LAD with 79%, 25 to 49% in LCx/RCA, severe lipomatous hypertrophy of the anterior atrial septum, FFR negative.  Echo in 10/2023 showed EF 70-75%, moderate LVH, moderate LAE, mild AI, moderate AS.  There was also a lung nodule being followed by pulmonology, bronc negative for malignant cells but plan for surveillance PET in 03/2024.  Patient was initially planned to have her right carotid surgery earlier this year then had admission for complex traumatic mechanical fall resulting in multiple fractures.  Patient also known to have abnormal ABIs.  In 2024 she had blurry vision in the right eye, carotid duplex suggested high-grade stenosis bilaterally, prompting CTA to confirm the same.  Follow-up duplex in 6/25 showed right carotid totally occluded, LICA was 80 to 99%.  On 02/12/2024 patient was brought in for left TCAR with plan for medical therapy on the right.  Per discussion with PACU nurse she was reported to be in trigeminy during the case and in bigeminy in PACU with sinus tachycardia.  Heart rate in improved to the 80s and ectopy resolved.  Patient denied any chest tightness or worsening dyspnea, noted to have chronic  baseline shortness of breath.  On 02/13/2024 patient went into atrial fibrillation and was started on metoprolol , was in and out of atrial fibrillation overnight.  Patient was cardiac unaware.  Patient was discharged on 02/14/2024.   On 02/16/2024 patient presented to [facility] ED for rectal bleeding, lab work revealed normal hemoglobin.  It was recommended she undergo CTA with GI consult and admission however patient declined.  On 02/18/2024 patient presented back to the emergency department with symptomatic GI bleed, she had been discharged on aspirin , Plavix  and Eliquis .  Patient was found to be in atrial fibrillation with RVR, was started on amiodarone  to attempt to maintain sinus rhythm.  Patient's blood pressure was 220/110 and 160/80.  GI was consulted with no recommendation for intervention as hemoglobin was stable.  Patient was continued on Plavix  75 mg daily however her aspirin  and Eliquis  were held.  Patient was started on amiodarone  200 mg twice daily for 14 days then 200 mg daily thereafter.  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  On 02/21/2024 patient returned to [facility] ED for altered mental status and near syncope with potential seizure.  Patient was observed to experience tonic-clonic seizure by witnesses.  Patient was admitted for further evaluation.  MRI brain and EEG were without acute findings.  Her lisinopril  was resumed and  metoprolol  was continued.  Amlodipine  was held.  Echo on 02/22/2024 indicated moderate aortic valve stenosis, and notes it was stated that prior study was performed during atrial fibrillation, exaggerating measured aortic valve stenosis gradients, aortic stenosis was noted to  be moderate not severe.  Patient had no further seizures and symptoms resolved, she was discharged home on 02/23/2024 with recommendations for neurology follow-up and no driving.  Today she presents for follow-up.  She reports that she has had significant anxiety following her recent hospital admission. She notes that her blood pressure has been elevated.    Atrial fibrillation: Patient with history of frequent PACs, during TCAR was noted to have ventricular bigeminy intraoperatively, she went to atrial fibrillation on 7/10. Patient initially managed on oral metoprolol  however later was re-admitted and found to be in afib with RVR.  Patient was started on amiodarone  to maintain sinus rhythm in setting of GI bleed as her Eliquis  needed to be held  CAD: Coronary CTA in 10/2023 with suggestion of severe LAD disease but FFR was negative and nonobstructive disease elsewhere. Today she reports that she is doing well, denies any significant chest pain, notes an occasional fleeting pain, noted previously.   Moderate AS, mild AI: Noted on echo in 10/2023, repeat echo on 02/22/24 indicated moderate aortic valve stenosis..  Today she appears  HLD: Last lipid profile on 02/14/2024 indicated LDL 67, TG 70, HDL 56.  Bilateral carotid disease: Right occluded left s/p TCAR.  Followed by vascular.  Anxiety: Patient with history of anxiety, notes this has significantly worsened given recent admissions. Patient will follow up with her PCP if no improvement.    ROS: .   Today she denies shortness of breath, lower extremity edema, fatigue,melena, hematuria, hemoptysis, diaphoresis, weakness, presyncope, syncope, orthopnea, and PND.  All other systems are reviewed and otherwise negative. Studies Reviewed: SABRA   EKG:  EKG is ordered today, personally reviewed, demonstrating  EKG Interpretation Date/Time:  Thursday February 27 2024 08:47:09 EDT Ventricular Rate:  63 PR Interval:  162 QRS Duration:  76 QT  Interval:  448 QTC Calculation: 458 R Axis:   50  Text Interpretation: Sinus rhythm with occasional Premature ventricular complexes When compared with ECG of 21-Feb-2024 15:04, PREVIOUS ECG IS PRESENT Confirmed by Hameed Kolar [phone]) on 02/27/2024 4:53:16 PM   CV Studies: Cardiac studies reviewed are outlined and summarized above. Otherwise please see EMR for full report. Cardiac Studies & Procedures   ______________________________________________________________________________________________     ECHOCARDIOGRAM  ECHOCARDIOGRAM COMPLETE 02/22/2024  Narrative ECHOCARDIOGRAM REPORT    Patient Name:   Denise [name] [facility] Date of Exam: 02/22/2024 Medical Rec #:  [id number]           Height:       63.0 in Accession #:    [id number]          Weight:       159.2 lb Date of Birth:  15-Jan-1961            BSA:          1.755 m Patient Age:    63 years            BP:           170/78 mmHg Patient Gender: F                   HR:           63 bpm. Exam Location:  Inpatient  Procedure: 2D Echo, Cardiac Doppler and  Color Doppler (Both Spectral and Color Flow Doppler were utilized during procedure).  Indications:    Syncope R55  History:        Patient has prior history of Echocardiogram examinations, most recent 02/18/2024. Cardiomegaly, CAD, COPD, Arrythmias:Atrial Flutter, Signs/Symptoms:Dyspnea; Risk Factors:Hypertension, Dyslipidemia and Current Smoker.  Sonographer:    Thea Norlander RCS Referring Phys: [phone] [facility]   Sonographer Comments: No parasternal window. IMPRESSIONS   1. Left ventricular ejection fraction, by estimation, is 60 to 65%. The left ventricle has normal function. The left ventricle has no regional wall motion abnormalities. There is mild concentric left ventricular hypertrophy. Left ventricular diastolic parameters are consistent with Grade I diastolic dysfunction (impaired relaxation). 2. Right ventricular systolic function is normal. The right  ventricular size is normal. Tricuspid regurgitation signal is inadequate for assessing PA pressure. 3. Left atrial size was mild to moderately dilated. 4. The mitral valve is degenerative. Mild mitral valve regurgitation. No evidence of mitral stenosis. Moderate mitral annular calcification. 5. The aortic valve is calcified. Aortic valve regurgitation is mild. Moderate aortic valve stenosis. Aortic valve mean gradient measures 24.0 mmHg. Aortic valve Vmax measures 3.32 m/s. Aortic valve acceleration time measures 83 msec.  Comparison(s): No significant change from prior study. Prior images reviewed side by side. Prior study was performed during atrial fibrillation, exaggerating the measured aortic valve stenosis gradients. Aortic stenosis is definitely present, but is not severe.  FINDINGS Left Ventricle: Left ventricular ejection fraction, by estimation, is 60 to 65%. The left ventricle has normal function. The left ventricle has no regional wall motion abnormalities. The left ventricular internal cavity size was normal in size. There is mild concentric left ventricular hypertrophy. Left ventricular diastolic parameters are consistent with Grade I diastolic dysfunction (impaired relaxation). Normal left ventricular filling pressure.  Right Ventricle: The right ventricular size is normal. No increase in right ventricular wall thickness. Right ventricular systolic function is normal. Tricuspid regurgitation signal is inadequate for assessing PA pressure.  Left Atrium: Left atrial size was mild to moderately dilated.  Right Atrium: Right atrial size was normal in size.  Pericardium: There is no evidence of pericardial effusion.  Mitral Valve: The mitral valve is degenerative in appearance. Moderate mitral annular calcification. Mild mitral valve regurgitation. No evidence of mitral valve stenosis.  Tricuspid Valve: The tricuspid valve is normal in structure. Tricuspid valve regurgitation is  trivial.  Aortic Valve: The aortic valve is calcified. Aortic valve regurgitation is mild. Moderate aortic stenosis is present. Aortic valve mean gradient measures 24.0 mmHg. Aortic valve peak gradient measures 44.0 mmHg. Aortic valve area, by VTI measures 0.98 cm.  Pulmonic Valve: The pulmonic valve was not well visualized.  Aorta: The aortic root was not well visualized.  IAS/Shunts: No atrial level shunt detected by color flow Doppler.   LEFT VENTRICLE PLAX 2D LVOT diam:     2.00 cm   Diastology LV SV:         68        LV e' medial:    6.96 cm/s LV SV Index:   39        LV E/e' medial:  13.5 LVOT Area:     3.14 cm  LV e' lateral:   7.72 cm/s LV E/e' lateral: 12.2   RIGHT VENTRICLE             IVC RV S prime:     13.80 cm/s  IVC diam: 1.60 cm TAPSE (M-mode): 1.9 cm  LEFT ATRIUM  Index        RIGHT ATRIUM           Index LA Vol (A2C):   58.8 ml 33.51 ml/m  RA Area:     16.05 cm LA Vol (A4C):   37.6 ml 21.43 ml/m  RA Volume:   36.25 ml  20.66 ml/m LA Biplane Vol: 49.2 ml 28.04 ml/m AORTIC VALVE AV Area (Vmax):    0.96 cm AV Area (Vmean):   0.91 cm AV Area (VTI):     0.98 cm AV Vmax:           331.50 cm/s AV Vmean:          225.000 cm/s AV VTI:            0.690 m AV Peak Grad:      44.0 mmHg AV Mean Grad:      24.0 mmHg LVOT Vmax:         101.00 cm/s LVOT Vmean:        65.500 cm/s LVOT VTI:          0.216 m LVOT/AV VTI ratio: 0.31  MITRAL VALVE                TRICUSPID VALVE MV Area (PHT): 2.83 cm     TR Peak grad:   18.7 mmHg MV Decel Time: 268 msec     TR Vmax:        216.00 cm/s MV E velocity: 93.80 cm/s MV A velocity: 122.00 cm/s  SHUNTS MV E/A ratio:  0.77         Systemic VTI:  0.22 m Systemic Diam: 2.00 cm  Jerel Croitoru MD Electronically signed by Jerel Balding MD Signature Date/Time: 02/22/2024/12:26:33 PM    Final      CT SCANS  CT CORONARY MORPH W/CTA COR W/SCORE 10/11/2023  Addendum 10/26/2023  5:22 AM ADDENDUM REPORT:  10/26/2023 05:20  EXAM: OVER-READ INTERPRETATION  CT CHEST  The following report is an over-read performed by radiologist Dr. VEAR [facility] on 10/26/2023. This over-read does not include interpretation of cardiac or coronary anatomy or pathology. The coronary CTA interpretation by the cardiologist is attached.  COMPARISON:  Cardiac CTA 04/29/2023.  FINDINGS: Lobulated superior segment left lower lobe lung nodule redemonstrated, now up to 16 mm long axis and has enlarged by about 1 mm since September. Underlying centrilobular emphysema which was more apparent on the previous exam.  Otherwise stable visible lungs and chest. No hilar or mediastinal lymphadenopathy identified. Cardiac findings detailed separately.  IMPRESSION: Slowly enlarging left lower lobe lung nodule, which was evaluated with PET-CT in 2022, but considering increased risk factors such as underlying Emphysema (ICD10-J43.9), Recommend referral to [facility]) if not already done, and additional evaluation to exclude a slow growing carcinoma.  These results will be called to the ordering clinician or representative by the Radiologist Assistant, and communication documented in the PACS or [employer].   Electronically Signed By: VEAR Hurst M.D. On: 10/26/2023 05:20  Narrative CLINICAL DATA:  Chest pain  EXAM: Cardiac/Coronary CTA  TECHNIQUE: A non-contrast, gated CT scan was obtained with axial slices of 3 mm through the heart for calcium  scoring. Calcium  scoring was performed using the Agatston method. A 120 kV prospective, gated, contrast cardiac scan was obtained. Gantry rotation speed was 250 msecs and collimation was 0.6 mm. Two sublingual nitroglycerin  tablets (0.8 mg) were given. The 3D data set was reconstructed in 5% intervals of the 35-75%  of the R-R cycle. Diastolic phases were analyzed on a dedicated workstation using MPR, MIP, and  VRT modes. The patient received 95 cc of contrast.  FINDINGS: Image quality: Excellent.  Noise artifact is: Limited.  Coronary Arteries:  Normal coronary origin.  Right dominance.  Left main: The left main is a large caliber vessel with a normal take off from the left coronary cusp that trifurcates to form a left anterior descending artery, ramus, and a left circumflex artery. There is no plaque or stenosis.  Left anterior descending artery: The proximal LAD contains severe mixed density plaque (70-99%). The mid LAD contains mild calcified plaque (25-49%). The distal LAD is patent. D1 contains mild calcified plaque (25-49%)  Ramus intermedius: Patent with no evidence of plaque or stenosis.  Left circumflex artery: The LCX is non-dominant. There is mild mixed density plaque (25-49%) in the proximal and mid segments. The LCX gives off 2 patent obtuse marginal branches.  Right coronary artery: The RCA is dominant with normal take off from the right coronary cusp. There is mild mixed density plaque in the proximal, mid, and distal segments (25-49%). The RCA terminates as a PDA and right posterolateral branch without evidence of plaque or stenosis.  Right Atrium: Right atrial size is within normal limits.  Right Ventricle: The right ventricular cavity is within normal limits.  Left Atrium: Left atrial size is normal in size with no left atrial appendage filling defect. Severe lipomatous hypertrophy of the interatrial septum extending into the RA, correlate with echocardiography.  Left Ventricle: The ventricular cavity size is within normal limits.  Pulmonary arteries: Normal in size.  Pulmonary veins: Normal pulmonary venous drainage.  Pericardium: Normal thickness without significant effusion or calcium  present.  Cardiac valves: The aortic valve is trileaflet without significant calcification. The mitral valve is normal with mild annular calcium .  Aorta: Normal  caliber without significant disease.  Extra-cardiac findings: See attached radiology report for non-cardiac structures.  IMPRESSION: 1. Coronary calcium  score of 659. This was 98th percentile for age-, sex, and race-matched controls.  2. Total plaque volume 708 mm3 which is 91st percentile for age- and sex-matched controls (calcified plaque 152 mm3; non-calcified plaque 556 mm3). TPV is severe.  3. Normal coronary origin with right dominance.  4. Severe mixed density plaque in the proximal LAD (70-99%).  5. Mild mixed density plaque in the LCX (25-49%).  6. Mild mixed density plaque in the RCA (25-49%).  7. Severe lipomatous hypertrophy of the interatrial septum extending into the RA, correlate with echocardiography.  RECOMMENDATIONS: 1. CAD-RADS 4: Severe stenosis. CT FFR will be submitted. Consider symptom-guided anti-ischemic pharmacotherapy as well as risk factor modification per guideline directed care. Invasive coronary angiography recommended with revascularization per published guideline statements.  Darryle Decent, MD  Electronically Signed: By: Darryle Decent M.D. On: 10/13/2023 19:25   CT SCANS  CT CARDIAC SCORING (SELF PAY ONLY) 04/29/2023  Addendum 05/13/2023 12:35 PM ADDENDUM REPORT: 05/13/2023 12:32  EXAM: OVER-READ INTERPRETATION  CT CHEST  The following report is an over-read performed by radiologist Dr. Toribio Cove [facility] on 05/13/2023. This over-read does not include interpretation of cardiac or coronary anatomy or pathology. The coronary calcium  score interpretation by the cardiologist is attached.  COMPARISON:  Chest CT 04/24/2021.  FINDINGS: Atherosclerotic calcifications in the thoracic aorta. Diffuse bronchial wall thickening with moderate centrilobular and mild paraseptal emphysema. Again noted is a macrolobulated nodule in the superior segment of the left lower lobe (axial image 9 of series 10) which  has  increased in size and currently measures 1.5 x 1.4 cm (previously 1.1 x 1.0 cm on 04/24/2021). This may have some internal fatty attenuation, but no calcifications. Within the visualized portions of the thorax there are no other new suspicious appearing pulmonary nodules or masses, there is no acute consolidative airspace disease, no pleural effusions, no pneumothorax and no lymphadenopathy. Visualized portions of the upper abdomen are unremarkable. There are no aggressive appearing lytic or blastic lesions noted in the visualized portions of the skeleton.  IMPRESSION: 1. Enlarging left lower lobe pulmonary nodule. Given the slow enlargement compared to prior examinations and lack of substantial hypermetabolism on prior PET-CT (as well as the possibility of internal fatty attenuation), the possibility of a benign lesion such as a hamartoma or slow growing neoplasm such as a carcinoid tumor is favored, although slow growing primary bronchogenic adenocarcinoma is not excluded (but not favored based on the morphology of the lesion). 2. Aortic Atherosclerosis (ICD10-I70.0) and Emphysema (ICD10-J43.9).   Electronically Signed By: Toribio Aye M.D. On: 05/13/2023 12:32  Narrative CLINICAL DATA:  Risk stratification  EXAM: Coronary Calcium  Score  TECHNIQUE: The patient was scanned on a Siemens Somatom scanner. Axial non-contrast 3 mm slices were carried out through the heart. The data set was analyzed on a dedicated work station and scored using the Agatson method.  FINDINGS: Non-cardiac: See separate report from [facility].  Ascending Aorta: Normal size, aortic wall calcifications  Pericardium: Normal  Coronary arteries: Normal origin of left and right coronary arteries. Distribution of arterial calcifications if present, as noted below;  LM 0  LAD 241  LCx 26.2  RCA 258  Total 526  IMPRESSION AND RECOMMENDATION: 1. Coronary calcium  score of 526.  This was 98th percentile for age and sex matched control.  2. CAC >300 in LAD, LCx, RCA. CAC-DRS A3/N3.  3. Recommend aspirin  and statin if no contraindication.  4. Recommend cardiology consultation.  5. Continue heart healthy lifestyle and risk factor modification.  Electronically Signed: By: Redell Cave M.D. On: 04/29/2023 15:38     ______________________________________________________________________________________________       Current Reported Medications:.    Current Meds  Medication Sig   acetaminophen  (TYLENOL ) 500 MG tablet Take 2 tablets (1,000 mg total) by mouth every 8 (eight) hours as needed.   albuterol  (VENTOLIN  HFA) 108 (90 Base) MCG/ACT inhaler INHALE 2 PUFFS INTO THE LUNGS EVERY 4-6 HOURS AS NEEDED FOR TIGHTNESS/WHEEZING   amiodarone  (PACERONE ) 200 MG tablet Take 1 tablet (200 mg total) by mouth 2 (two) times daily for 14 days, THEN 1 tablet (200 mg total) daily.   amLODipine  (NORVASC ) 5 MG tablet Take 1 tablet (5 mg total) by mouth daily.   Ascorbic Acid  (VITAMIN C ) 1000 MG tablet Take 2,000 mg by mouth daily.   atorvastatin  (LIPITOR ) 80 MG tablet TAKE 1 TABLET (80 MG TOTAL) BY MOUTH DAILY FOR CHOLESTEROL   busPIRone  (BUSPAR ) 5 MG tablet Take 1 tablet (5 mg total) by mouth 2 (two) times daily. For anxiety   clopidogrel  (PLAVIX ) 75 MG tablet Take 1 tablet (75 mg total) by mouth daily.   escitalopram  (LEXAPRO ) 20 MG tablet TAKE 1 TABLET BY MOUTH DAILY FOR ANXIETY   [Paused] ferrous sulfate  325 (65 FE) MG tablet Take 650 mg by mouth daily with breakfast.   fluticasone -salmeterol (WIXELA INHUB) 250-50 MCG/ACT AEPB INHALE 1 PUFF INTO THE LUNGS IN THE MORNING AND AT BEDTIME.   lisinopril  (ZESTRIL ) 30 MG tablet Take 30 mg by mouth daily.  metoprolol  tartrate (LOPRESSOR ) 50 MG tablet Take 1 tablet (50 mg total) by mouth 2 (two) times daily.   nystatin  (MYCOSTATIN ) 100000 UNIT/ML suspension Take 5 mLs (500,000 Units total) by mouth 4 (four) times daily for 10  days.   pantoprazole  (PROTONIX ) 40 MG tablet Take 1 tablet (40 mg total) by mouth 2 (two) times daily.   pyridoxine  (B-6) 100 MG tablet Take 100 mg by mouth daily.   sodium chloride  1 g tablet TAKE 1 TABLET (1 G TOTAL) BY MOUTH 2 (TWO) TIMES DAILY WITH A MEAL. TO INCREASE SODIUM   vitamin B-12 (CYANOCOBALAMIN ) 1000 MCG tablet Take 1,000 mcg by mouth daily.   vitamin E  180 MG (400 UNITS) capsule Take 400 Units by mouth daily.    Physical Exam:    VS:  BP (!) 164/82   Pulse 66   Ht 5' 3 (1.6 m)   Wt 155 lb (70.3 kg)   SpO2 97%   BMI 27.46 kg/m    Wt Readings from Last 3 Encounters:  02/27/24 155 lb (70.3 kg)  02/26/24 158 lb (71.7 kg)  02/23/24 160 lb 0.9 oz (72.6 kg)    GEN: Well nourished, well developed in no acute distress NECK: No JVD; No carotid bruits CARDIAC: ***RRR, no murmurs, rubs, gallops RESPIRATORY:  Clear to auscultation without rales, wheezing or rhonchi  ABDOMEN: Soft, non-tender, non-distended EXTREMITIES:  No edema; No acute deformity     Asessement and Plan:.     ***     Disposition: F/u with ***  Signed, Leslieanne Cobarrubias D Shakia Sebastiano, NP

## 2024-02-26 NOTE — Assessment & Plan Note (Signed)
 With recent hospitalization. Hospital labs, notes, imaging reviewed.  Continue clopidogrel  75 mg daily for a total of 1 month. Hold Eliquis  and aspirin  daily for now.  She will update if she notices more bleeding gastrointestinally.

## 2024-02-26 NOTE — Progress Notes (Signed)
 Subjective:    Patient ID: Denise [name], female    DOB: 1961-04-06, 63 y.o.   MRN: [id number]  HPI  Denise [name] is a very pleasant 63 y.o. female with a significant medical history including hypertension, cardiomegaly, AVM of colon, CAD, bilateral carotid artery disease, atrial flutter with RVR, COPD, SIADH, tobacco use, anxiety depression, GI bleeding who presents today for hospital follow-up.  She underwent left transcarotid artery revascularization per Dr. Gretta on 02/12/2024, admitted to the hospital postoperatively, experienced hypotension and atrial fibrillation during the stay.  Cardiology consulted who initiated metoprolol  tartrate 25 mg twice daily, Eliquis  5 mg twice daily in addition to aspirin  81 mg and clopidogrel  75 mg daily.  She was discharged home on 02/14/2024.  She presented to [facility] ED on 02/16/2024 for rectal bleeding.  Lab work revealed normal hemoglobin.  It was recommended she undergo CTA with GI consult and admission but she declined.  It was also recommended she undergo admission for cardiology consultation, and her EKG, and repeat troponin levels but she also refused.  She was discharged home later that evening.  She returned to [facility] ED via EMS on 02/17/2024 for hypertension with blood pressure readings 220/110 and 160/80, and rectal bleeding x 4 days.  During her stay in the ED she was found to be in atrial fibrillation with RVR, treatment was initiated, she was admitted for further evaluation of GI bleed and atrial fibrillation.  During her hospital stay GI consulted recommended no intervention as hemoglobin was stable and in the setting of new onset atrial fibrillation.  Her clopidogrel  75 mg was continued daily but her aspirin  and Eliquis  were held temporarily.  She underwent echocardiogram which showed moderate to severe aortic stenosis.  She was initiated on amiodarone  200 mg BID x 14 days then 200 mg daily thereafter. She was discharged home on 02/19/2024 with  recommendations for PCP, cardiology, and GI, vascular surgery follow-up.  She returned to [facility] ED on 02/21/2024 for altered mental status and near syncope with potential seizure.  She was observed to experience tonic-clonic seizures by witnesses.  She was admitted for further evaluation.  During her hospitalization underwent MRI brain and EEG.  MRI brain and EEG were without acute findings.  Her lisinopril  was resumed and metoprolol  was continued.  Amlodipine  was on hold.  She had no further seizures and symptoms resolved so she was discharged home on 02/23/2024 with recommendations for neurology follow-up and no driving.  Today she's feeling about the same. She denies near syncope and seizure activity. She is compliant to her clopidogrel  75 mg daily. She remains off Eliquis  and aspirin , has an appointment with cardiology scheduled for tomorrow and vascular surgery for 03/17/24. She has noticed intermittent mild nose bleed to the left nostril. This morning she blew out a small blood clot from the left nares. She is compliant to her metoprolol  tartrate 50 mg BID and lisinopril  20 mg daily. She is not checking BP at home as she is scared to do so. She denies rectal bleeding.   She has yet to hear from the neurology referral. She has noticed headaches occurring daily since her vascular surgery which are located to the frontal lobes.   BP Readings from Last 3 Encounters:  02/26/24 (!) 162/98  02/23/24 (!) 163/81  02/19/24 (!) 149/85     Review of Systems  HENT:  Positive for nosebleeds.   Respiratory:  Negative for shortness of breath.   Cardiovascular:  Negative for chest pain.  Gastrointestinal:  Negative for blood in stool.  Neurological:  Positive for headaches. Negative for dizziness.         Past Medical History:  Diagnosis Date   Allergy    Anemia 12/24/2019   Anxiety    Aortic insufficiency    Aortic stenosis    Arthritis    Carotid artery disease (HCC)    Centrilobular emphysema  (HCC) 11/2023   Chickenpox    Cholecystitis with cholelithiasis 03/31/2012   Closed fracture of L4 transverse process s/p traumatic mechanical fall 10/26/2023   COPD (chronic obstructive pulmonary disease) (HCC)    Coronary artery disease    Depression    Diastolic dysfunction    Dyspnea    with exertion   GERD (gastroesophageal reflux disease)    Headache    Heart murmur    High anion gap metabolic acidosis 10/26/2023   History of blood transfusion 2021   HLD (hyperlipidemia)    Hypertension 03/31/2012   Hyponatremia in setting of SIADH    Left lower lobe pulmonary nodule 11/2023   Long-term use of aspirin  therapy    Lumbar compression fracture s/p traumatic mechanical fall; L4 superior endplate    Pneumonia    x 2   PVD (peripheral vascular disease) (HCC)    Shingles    SIADH (syndrome of inappropriate ADH production) (HCC)    Traumatic mechanical fall 10/26/2023   a.) s/p traumatic fall 10/26/2023 reuslting in multiple RIGHT rib (5th - 7th) fractures (required dchest tube), l$ transverse process fracture, L4 superior endplate compression fracture, large volume post-traumatic myofacial/subcutaneous gas collection in chest    Social History   Socioeconomic History   Marital status: Widowed    Spouse name: Not on file   Number of children: 2   Years of education: Not on file   Highest education level: 12th grade  Occupational History   Not on file  Tobacco Use   Smoking status: Former    Current packs/day: 0.00    Average packs/day: 0.8 packs/day for 38.0 years (28.5 ttl pk-yrs)    Types: Cigarettes    Start date: 05/06/1985    Quit date: 05/07/2023    Years since quitting: 0.8   Smokeless tobacco: Never  Vaping Use   Vaping status: Never Used  Substance and Sexual Activity   Alcohol use: Yes    Alcohol/week: 1.0 standard drink of alcohol    Types: 1 Standard drinks or equivalent per week    Comment: occasionally   Drug use: No   Sexual activity: Not Currently     Birth control/protection: Post-menopausal  Other Topics Concern   Not on file  Social History Narrative   Widow.    2 children.   Works in Clinical biochemist.   Enjoys reading, cooking.    Social Drivers of Health   Financial Resource Strain: Patient Declined (01/28/2024)   Overall Financial Resource Strain (CARDIA)    Difficulty of Paying Living Expenses: Patient declined  Food Insecurity: No Food Insecurity (02/22/2024)   Hunger Vital Sign    Worried About Running Out of Food in the Last Year: Never true    Ran Out of Food in the Last Year: Never true  Recent Concern: Food Insecurity - Food Insecurity Present (02/19/2024)   Hunger Vital Sign    Worried About Running Out of Food in the Last Year: Sometimes true    Ran Out of Food in the Last Year: Sometimes true  Transportation Needs: No Transportation Needs (02/19/2024)   PRAPARE -  Administrator, Civil Service (Medical): No    Lack of Transportation (Non-Medical): No  Recent Concern: Transportation Needs - Unmet Transportation Needs (11/29/2023)   PRAPARE - Transportation    Lack of Transportation (Medical): No    Lack of Transportation (Non-Medical): Yes  Physical Activity: Insufficiently Active (01/28/2024)   Exercise Vital Sign    Days of Exercise per Week: 4 days    Minutes of Exercise per Session: 20 min  Stress: Stress Concern Present (01/28/2024)   [name] of Occupational Health - Occupational Stress Questionnaire    Feeling of Stress: Very much  Social Connections: Unknown (02/19/2024)   Social Connection and Isolation Panel    Frequency of Communication with Friends and Family: More than three times a week    Frequency of Social Gatherings with Friends and Family: More than three times a week    Attends Religious Services: Patient declined    Database administrator or Organizations: Patient declined    Attends Banker Meetings: Patient declined    Marital Status: Patient declined   Recent Concern: Social Connections - Socially Isolated (01/28/2024)   Social Connection and Isolation Panel    Frequency of Communication with Friends and Family: Three times a week    Frequency of Social Gatherings with Friends and Family: Patient declined    Attends Religious Services: Patient declined    Active Member of Clubs or Organizations: No    Attends Engineer, structural: Not on file    Marital Status: Widowed  Intimate Partner Violence: Not At Risk (02/19/2024)   Humiliation, Afraid, Rape, and Kick questionnaire    Fear of Current or Ex-Partner: No    Emotionally Abused: No    Physically Abused: No    Sexually Abused: No    Past Surgical History:  Procedure Laterality Date   BIOPSY  12/24/2019   Procedure: BIOPSY;  Surgeon: Teressa Toribio SQUIBB, MD;  Location: [facility] ENDOSCOPY;  Service: Endoscopy;;   BRONCHIAL BIOPSY  12/19/2023   Procedure: BRONCHOSCOPY, WITH BIOPSY;  Surgeon: Isadora Hose, MD;  Location: [facility] ENDOSCOPY;  Service: Pulmonary;;   BRONCHIAL BRUSHINGS  12/19/2023   Procedure: BRONCHOSCOPY, WITH BRUSH BIOPSY;  Surgeon: Isadora Hose, MD;  Location: [facility] ENDOSCOPY;  Service: Pulmonary;;   BRONCHIAL NEEDLE ASPIRATION BIOPSY  12/19/2023   Procedure: BRONCHOSCOPY, WITH NEEDLE ASPIRATION BIOPSY;  Surgeon: Isadora Hose, MD;  Location: [facility] ENDOSCOPY;  Service: Pulmonary;;   BRONCHIAL WASHINGS  12/19/2023   Procedure: IRRIGATION, BRONCHUS;  Surgeon: Isadora Hose, MD;  Location: [facility] ENDOSCOPY;  Service: Pulmonary;;   BRONCHOSCOPY, WITH BIOPSY USING ELECTROMAGNETIC NAVIGATION Bilateral 12/19/2023   Procedure: ROBOTIC ASSISTED NAVIGATIONAL BRONCHOSCOPY;  Surgeon: Isadora Hose, MD;  Location: [facility] ENDOSCOPY;  Service: Pulmonary;  Laterality: Bilateral;   CATARACT EXTRACTION Bilateral 2021   CHOLECYSTECTOMY  03/30/2012   Procedure: LAPAROSCOPIC CHOLECYSTECTOMY WITH INTRAOPERATIVE CHOLANGIOGRAM;  Surgeon: Morene ONEIDA Olives, MD;  Location: [facility] ORS;  Service: General;   Laterality: N/A;   COLONOSCOPY WITH PROPOFOL  N/A 12/24/2019   Procedure: COLONOSCOPY WITH PROPOFOL ;  Surgeon: Teressa Toribio SQUIBB, MD;  Location: [facility] ENDOSCOPY;  Service: Endoscopy;  Laterality: N/A;   ESOPHAGOGASTRODUODENOSCOPY (EGD) WITH PROPOFOL  N/A 12/24/2019   Procedure: ESOPHAGOGASTRODUODENOSCOPY (EGD) WITH PROPOFOL ;  Surgeon: Teressa Toribio SQUIBB, MD;  Location: [facility] ENDOSCOPY;  Service: Endoscopy;  Laterality: N/A;   HOT HEMOSTASIS N/A 12/24/2019   Procedure: HOT HEMOSTASIS (ARGON PLASMA COAGULATION/BICAP);  Surgeon: Teressa Toribio SQUIBB, MD;  Location: [facility] ENDOSCOPY;  Service: Endoscopy;  Laterality: N/A;   POLYPECTOMY  12/24/2019   Procedure: POLYPECTOMY;  Surgeon: Teressa Toribio SQUIBB, MD;  Location: [facility] ENDOSCOPY;  Service: Endoscopy;;   TRANSCAROTID ARTERY REVASCULARIZATION  Left 02/12/2024   Procedure: TRANSCAROTID ARTERY REVASCULARIZATION (TCAR);  Surgeon: Gretta Lonni PARAS, MD;  Location: [facility] OR;  Service: Vascular;  Laterality: Left;   TUBAL LIGATION  1997   VIDEO BRONCHOSCOPY WITH ENDOBRONCHIAL ULTRASOUND  12/19/2023   Procedure: BRONCHOSCOPY, WITH EBUS;  Surgeon: Isadora Hose, MD;  Location: [facility] ENDOSCOPY;  Service: Pulmonary;;   VIDEO BRONCHOSCOPY WITH RADIAL ENDOBRONCHIAL ULTRASOUND  12/19/2023   Procedure: VIDEO BRONCHOSCOPY WITH RADIAL ENDOBRONCHIAL ULTRASOUND;  Surgeon: Isadora Hose, MD;  Location: [facility] ENDOSCOPY;  Service: Pulmonary;;    Family History  Problem Relation Age of Onset   Arthritis Mother    Cervical cancer Mother    Heart disease Father    Hypertension Father     No Known Allergies  Current Outpatient Medications on File Prior to Visit  Medication Sig Dispense Refill   acetaminophen  (TYLENOL ) 500 MG tablet Take 2 tablets (1,000 mg total) by mouth every 8 (eight) hours as needed.     albuterol  (VENTOLIN  HFA) 108 (90 Base) MCG/ACT inhaler INHALE 2 PUFFS INTO THE LUNGS EVERY 4-6 HOURS AS NEEDED FOR TIGHTNESS/WHEEZING 8.5 each 0   amiodarone  (PACERONE ) 200 MG tablet  Take 1 tablet (200 mg total) by mouth 2 (two) times daily for 14 days, THEN 1 tablet (200 mg total) daily. 58 tablet 0   Ascorbic Acid  (VITAMIN C ) 1000 MG tablet Take 2,000 mg by mouth daily.     atorvastatin  (LIPITOR ) 80 MG tablet TAKE 1 TABLET (80 MG TOTAL) BY MOUTH DAILY FOR CHOLESTEROL 90 tablet 0   busPIRone  (BUSPAR ) 5 MG tablet Take 1 tablet (5 mg total) by mouth 2 (two) times daily. For anxiety 180 tablet 0   clopidogrel  (PLAVIX ) 75 MG tablet Take 1 tablet (75 mg total) by mouth daily. 30 tablet 6   escitalopram  (LEXAPRO ) 20 MG tablet TAKE 1 TABLET BY MOUTH DAILY FOR ANXIETY 90 tablet 1   fluticasone -salmeterol (WIXELA INHUB) 250-50 MCG/ACT AEPB INHALE 1 PUFF INTO THE LUNGS IN THE MORNING AND AT BEDTIME. 180 each 1   lisinopril  (ZESTRIL ) 30 MG tablet Take 30 mg by mouth daily.     metoprolol  tartrate (LOPRESSOR ) 50 MG tablet Take 1 tablet (50 mg total) by mouth 2 (two) times daily. 60 tablet 0   nystatin  (MYCOSTATIN ) 100000 UNIT/ML suspension Take 5 mLs (500,000 Units total) by mouth 4 (four) times daily for 10 days. 473 mL 0   pantoprazole  (PROTONIX ) 40 MG tablet Take 1 tablet (40 mg total) by mouth 2 (two) times daily. 60 tablet 0   pyridoxine  (B-6) 100 MG tablet Take 100 mg by mouth daily.     sodium chloride  1 g tablet TAKE 1 TABLET (1 G TOTAL) BY MOUTH 2 (TWO) TIMES DAILY WITH A MEAL. TO INCREASE SODIUM 60 tablet 0   vitamin B-12 (CYANOCOBALAMIN ) 1000 MCG tablet Take 1,000 mcg by mouth daily.     vitamin E  180 MG (400 UNITS) capsule Take 400 Units by mouth daily.     [Paused] ferrous sulfate  325 (65 FE) MG tablet Take 650 mg by mouth daily with breakfast. (Patient not taking: Reported on 02/26/2024)     No current facility-administered medications on file prior to visit.    BP (!) 162/98   Pulse 61   Temp 97.7 F (36.5 C) (Temporal)   Ht 5' 3 (1.6 m)   Wt 158 lb (71.7 kg)  SpO2 97%   BMI 27.99 kg/m  Objective:   Physical Exam Cardiovascular:     Rate and Rhythm: Normal  rate and regular rhythm.  Pulmonary:     Effort: Pulmonary effort is normal.     Breath sounds: Normal breath sounds.  Musculoskeletal:     Cervical back: Neck supple.  Skin:    General: Skin is warm and dry.  Neurological:     Mental Status: She is alert and oriented to person, place, and time.  Psychiatric:        Mood and Affect: Mood normal.           Assessment & Plan:  Essential hypertension Assessment & Plan: Above goal.   Increase lisinopril  to 30 mg daily. She will monitor BP and update if readings remain above goal. Consider resuming amlodipine  at 5 mg if needed.  Continue metoprolol  tartrate 50 mg twice daily.   Gastrointestinal hemorrhage, unspecified gastrointestinal hemorrhage type Assessment & Plan: With recent hospitalization. Hospital labs, notes, imaging reviewed.  Continue clopidogrel  75 mg daily for a total of 1 month. Hold Eliquis  and aspirin  daily for now.  She will update if she notices more bleeding gastrointestinally.   Seizure [facility]) Assessment & Plan: With recent hospitalization. Hospital notes, labs, imaging reviewed.  Working to improve blood pressure. Follow-up with neurology once scheduled.  Discussed that she should be receiving a phone call within the next week and to notify if not.   Atrial flutter with rapid ventricular response [facility]) Assessment & Plan: With recent hospitalization. Hospital labs, notes, imaging reviewed.  Continue clopidogrel  75 mg daily for now. Hold Eliquis  5 mg twice daily and aspirin  81 mg daily for now.  Continue amiodarone  200 mg twice daily x 14 days then 200 mg daily thereafter. Follow-up with cardiology as scheduled.         Khalea Ventura K Ahmaya Ostermiller, NP

## 2024-02-26 NOTE — Assessment & Plan Note (Signed)
 With recent hospitalization. Hospital notes, labs, imaging reviewed.  Working to improve blood pressure. Follow-up with neurology once scheduled.  Discussed that she should be receiving a phone call within the next week and to notify if not.

## 2024-02-26 NOTE — Assessment & Plan Note (Signed)
 With recent hospitalization. Hospital labs, notes, imaging reviewed.  Continue clopidogrel  75 mg daily for now. Hold Eliquis  5 mg twice daily and aspirin  81 mg daily for now.  Continue amiodarone  200 mg twice daily x 14 days then 200 mg daily thereafter. Follow-up with cardiology as scheduled.

## 2024-02-27 ENCOUNTER — Ambulatory Visit: Attending: Cardiology

## 2024-02-27 ENCOUNTER — Ambulatory Visit: Attending: Cardiology | Admitting: Cardiology

## 2024-02-27 ENCOUNTER — Encounter: Payer: Self-pay | Admitting: Cardiology

## 2024-02-27 ENCOUNTER — Other Ambulatory Visit: Payer: Self-pay

## 2024-02-27 VITALS — BP 164/82 | HR 66 | Ht 63.0 in | Wt 155.0 lb

## 2024-02-27 DIAGNOSIS — I480 Paroxysmal atrial fibrillation: Secondary | ICD-10-CM

## 2024-02-27 DIAGNOSIS — I350 Nonrheumatic aortic (valve) stenosis: Secondary | ICD-10-CM | POA: Diagnosis not present

## 2024-02-27 DIAGNOSIS — I6523 Occlusion and stenosis of bilateral carotid arteries: Secondary | ICD-10-CM

## 2024-02-27 DIAGNOSIS — I2510 Atherosclerotic heart disease of native coronary artery without angina pectoris: Secondary | ICD-10-CM

## 2024-02-27 DIAGNOSIS — E782 Mixed hyperlipidemia: Secondary | ICD-10-CM

## 2024-02-27 DIAGNOSIS — I10 Essential (primary) hypertension: Secondary | ICD-10-CM

## 2024-02-27 MED ORDER — AMLODIPINE BESYLATE 5 MG PO TABS: 5.0000 mg | ORAL_TABLET | Freq: Every day | ORAL | 3 refills | Status: DC

## 2024-02-27 NOTE — Patient Instructions (Signed)
 Medication Instructions:  Start Amlodipine  5 mg once a day  *If you need a refill on your cardiac medications before your next appointment, please call your pharmacy*  Lab Work: In 1 week we are going to need a CBC If you have labs (blood work) drawn today and your tests are completely normal, you will receive your results only by: MyChart Message (if you have MyChart) OR A paper copy in the mail If you have any lab test that is abnormal or we need to change your treatment, we will call you to review the results.  Testing/Procedures: Next page  Follow-Up: At [facility], you and your health needs are our priority.  As part of our continuing mission to provide you with exceptional heart care, our providers are all part of one team.  This team includes your primary Cardiologist (physician) and Advanced Practice Providers or APPs (Physician Assistants and Nurse Practitioners) who all work together to provide you with the care you need, when you need it.  Your next appointment:   6 week(s)  Provider:   Katlyn West, NP Then, Denise Bruckner, MD will plan to see you again in 4-5 month(s).    We recommend signing up for the patient portal called MyChart.  Sign up information is provided on this After Visit Summary.  MyChart is used to connect with patients for Virtual Visits (Telemedicine).  Patients are able to view lab/test results, encounter notes, upcoming appointments, etc.  Non-urgent messages can be sent to your provider as well.   To learn more about what you can do with MyChart, go to [email or web address].   Other Instructions ZIO XT- Long Term Monitor Instructions  Your physician has requested you wear a ZIO patch monitor for 14 days.  This is a single patch monitor. [employer] supplies one patch monitor per enrollment. Additional stickers are not available. Please do not apply patch if you will be having a Nuclear Stress Test,  Echocardiogram, Cardiac CT, MRI, or  Chest Xray during the period you would be wearing the  monitor. The patch cannot be worn during these tests. You cannot remove and re-apply the  ZIO XT patch monitor.  Your ZIO patch monitor will be mailed 3 day USPS to your address on file. It may take 3-5 days  to receive your monitor after you have been enrolled.  Once you have received your monitor, please review the enclosed instructions. Your monitor  has already been registered assigning a specific monitor serial # to you.  Billing and Patient Assistance Program Information  We have supplied [employer] with any of your insurance information on file for billing purposes. [employer] offers a sliding scale Patient Assistance Program for patients that do not have  insurance, or whose insurance does not completely cover the cost of the ZIO monitor.  You must apply for the Patient Assistance Program to qualify for this discounted rate.  To apply, please call [employer] at [phone], select option 4, select option 2, ask to apply for  Patient Assistance Program. Denise [name] will ask your household income, and how many people  are in your household. They will quote your out-of-pocket cost based on that information.  [employer] will also be able to set up a 86-month, interest-free payment plan if needed.  Applying the monitor   Shave hair from upper left chest.  Hold abrader disc by orange tab. Rub abrader in 40 strokes over the upper left chest as  indicated in your monitor instructions.  Clean area with  4 enclosed alcohol pads. Let dry.  Apply patch as indicated in monitor instructions. Patch will be placed under collarbone on left  side of chest with arrow pointing upward.  Rub patch adhesive wings for 2 minutes. Remove white label marked 1. Remove the white  label marked 2. Rub patch adhesive wings for 2 additional minutes.  While looking in a mirror, press and release button in center of patch. A small green light will  flash 3-4 times. This  will be your only indicator that the monitor has been turned on.  Do not shower for the first 24 hours. You may shower after the first 24 hours.  Press the button if you feel a symptom. You will hear a small click. Record Date, Time and  Symptom in the Patient Logbook.  When you are ready to remove the patch, follow instructions on the last 2 pages of Patient  Logbook. Stick patch monitor onto the last page of Patient Logbook.  Place Patient Logbook in the blue and white box. Use locking tab on box and tape box closed  securely. The blue and white box has prepaid postage on it. Please place it in the mailbox as  soon as possible. Your physician should have your test results approximately 7 days after the  monitor has been mailed back to [facility].  Call [facility] Customer Care at [phone] if you have questions regarding  your ZIO XT patch monitor. Call them immediately if you see an orange light blinking on your  monitor.  If your monitor falls off in less than 4 days, contact our Monitor department at [phone].  If your monitor becomes loose or falls off after 4 days call [employer] at [phone] for  suggestions on securing your monitor

## 2024-02-27 NOTE — Progress Notes (Unsigned)
 Enrolled patient for a 7 day Zio XT monitor to be mailed to patients home  B Lonni to read

## 2024-03-03 ENCOUNTER — Ambulatory Visit: Payer: Self-pay | Admitting: *Deleted

## 2024-03-03 NOTE — Telephone Encounter (Signed)
 FYI Only or Action Required?: FYI only for provider.  Patient was last seen in primary care on 02/26/2024 by Gretta Comer POUR, NP.  Called Nurse Triage reporting Post-op Problem.  Symptoms began several days ago.  Interventions attempted: Nothing.  Symptoms are: gradually worsening.  Triage Disposition: See Physician Within 24 Hours  Patient/caregiver understands and will follow disposition?: Patient advised to contact her surgeon- she should be able to be checked at their office. Patient will call PCP if she has any problems getting them to check it.     Reason for Disposition  [1] INCREASING pain in incision AND [2] > 2 days (48 hours) since surgery  Answer Assessment - Initial Assessment Questions 1. SYMPTOM: What's the main symptom you're concerned about? (e.g., drainage, incision opened up, pain, redness)     Sore and red, puffy 2. ONSET: When did tenderness  start?     Sat/Sun- more tender 3. SURGERY: What surgery did you have?     Stint place- L Caroid artery 4. DATE of SURGERY: When was the surgery?      02/12/24 5. INCISION SITE: Where is the incision located?      At collarbone 6. REDNESS: Is there any redness at the incision site? If Yes, ask: How wide across is the redness? (Inches, centimeters)      Patient has 3 incisions- only one of them is irritated 7. PAIN: Is there any pain? If Yes, ask: How bad is it?  (Scale 0-10; or none, mild, moderate, severe)     Tender to touch- movement, 4-5/10 8. BLEEDING: Is there any bleeding? If Yes, ask: How much? and Where?     no 9. DRAINAGE: Is there any drainage from the incision site? If Yes, ask: What color and how much? (e.g., red, cloudy, pus; drops, teaspoon)     no 10. FEVER: Do you have a fever? If Yes, ask: What is your temperature, how was it measured, and when did it start?       no 11. OTHER SYMPTOMS: Do you have any other symptoms? (e.g., dizziness, rash elsewhere on body,  shaking chills, weakness)       no  Protocols used: Post-Op Incision Symptoms and Questions-A-AH    Copied from CRM #[id number]. Topic: Clinical - Red Word Triage >> Mar 03, 2024  1:27 PM Henretta I wrote: Red Word that prompted transfer to Nurse Triage: Sore incision stitch near collarbone and red possible infection and puffy.

## 2024-03-03 NOTE — Telephone Encounter (Signed)
 Noted

## 2024-03-06 ENCOUNTER — Telehealth: Payer: Self-pay

## 2024-03-06 DIAGNOSIS — E785 Hyperlipidemia, unspecified: Secondary | ICD-10-CM

## 2024-03-06 DIAGNOSIS — I480 Paroxysmal atrial fibrillation: Secondary | ICD-10-CM

## 2024-03-06 DIAGNOSIS — I350 Nonrheumatic aortic (valve) stenosis: Secondary | ICD-10-CM

## 2024-03-06 DIAGNOSIS — I2510 Atherosclerotic heart disease of native coronary artery without angina pectoris: Secondary | ICD-10-CM

## 2024-03-06 NOTE — Telephone Encounter (Signed)
 Lab scheduled 10am 8.4.25

## 2024-03-06 NOTE — Telephone Encounter (Signed)
 Copied from CRM [id number]. Topic: Clinical - Request for Lab/Test Order >> Mar 05, 2024  2:16 PM Suzen RAMAN wrote: Reason for CRM: Patient would like orders place for CBC panel. Patient states she is not familiar with the area and would prefer to come to the office to have labs drawn. Current orders in the system are from her Cardiologist and per CAL the office doesn't accept orders from another provider. Please contact patient to schedule once orders are placed.  CB#[phone]

## 2024-03-06 NOTE — Addendum Note (Signed)
 Addended by: Myishia Kasik K on: 03/06/2024 09:24 AM   Modules accepted: Orders

## 2024-03-06 NOTE — Telephone Encounter (Signed)
 Noted, lab orders placed. Okay to set up lab appt.

## 2024-03-09 ENCOUNTER — Other Ambulatory Visit (INDEPENDENT_AMBULATORY_CARE_PROVIDER_SITE_OTHER)

## 2024-03-09 ENCOUNTER — Ambulatory Visit: Payer: Self-pay | Admitting: Primary Care

## 2024-03-09 DIAGNOSIS — I350 Nonrheumatic aortic (valve) stenosis: Secondary | ICD-10-CM | POA: Diagnosis not present

## 2024-03-09 DIAGNOSIS — I2510 Atherosclerotic heart disease of native coronary artery without angina pectoris: Secondary | ICD-10-CM | POA: Diagnosis not present

## 2024-03-09 DIAGNOSIS — E785 Hyperlipidemia, unspecified: Secondary | ICD-10-CM

## 2024-03-09 DIAGNOSIS — I480 Paroxysmal atrial fibrillation: Secondary | ICD-10-CM | POA: Diagnosis not present

## 2024-03-09 LAB — CBC
HCT: 45.3 % (ref 36.0–46.0)
Hemoglobin: 15.1 g/dL — ABNORMAL HIGH (ref 12.0–15.0)
MCHC: 33.3 g/dL (ref 30.0–36.0)
MCV: 100.7 fl — ABNORMAL HIGH (ref 78.0–100.0)
Platelets: 336 K/uL (ref 150.0–400.0)
RBC: 4.5 Mil/uL (ref 3.87–5.11)
RDW: 19.9 % — ABNORMAL HIGH (ref 11.5–15.5)
WBC: 9.5 K/uL (ref 4.0–10.5)

## 2024-03-09 LAB — BASIC METABOLIC PANEL WITH GFR
BUN: 6 mg/dL (ref 6–23)
CO2: 29 meq/L (ref 19–32)
Calcium: 9.7 mg/dL (ref 8.4–10.5)
Chloride: 94 meq/L — ABNORMAL LOW (ref 96–112)
Creatinine, Ser: 0.66 mg/dL (ref 0.40–1.20)
GFR: 93.92 mL/min (ref >60.00)
Glucose, Bld: 121 mg/dL — ABNORMAL HIGH (ref 70–99)
Potassium: 4.5 meq/L (ref 3.5–5.1)
Sodium: 131 meq/L — ABNORMAL LOW (ref 135–145)

## 2024-03-16 ENCOUNTER — Other Ambulatory Visit: Payer: Self-pay | Admitting: Primary Care

## 2024-03-16 DIAGNOSIS — J449 Chronic obstructive pulmonary disease, unspecified: Secondary | ICD-10-CM

## 2024-03-16 DIAGNOSIS — E871 Hypo-osmolality and hyponatremia: Secondary | ICD-10-CM

## 2024-03-17 ENCOUNTER — Ambulatory Visit: Attending: Vascular Surgery | Admitting: Physician Assistant

## 2024-03-17 ENCOUNTER — Ambulatory Visit (HOSPITAL_COMMUNITY)
Admission: RE | Admit: 2024-03-17 | Discharge: 2024-03-17 | Disposition: A | Discharge status: Home / Self Care | Source: Ambulatory Visit | Attending: Vascular Surgery | Admitting: Vascular Surgery

## 2024-03-17 VITALS — BP 155/89 | HR 82 | Temp 97.7°F | Wt 156.1 lb

## 2024-03-17 DIAGNOSIS — I6523 Occlusion and stenosis of bilateral carotid arteries: Secondary | ICD-10-CM | POA: Diagnosis present

## 2024-03-17 NOTE — Telephone Encounter (Signed)
-----   Message from Katlyn D West sent at 03/13/2024  6:50 PM EDT ----- Please let Ms. Neuser know that her hemoglobin and hematocrit are improved, there is no evidence of infection. Her kidney function and electrolytes are overall normal, her sodium level is slightly  low, she is currently on sodium tablets from her PCP, please remind her to take as prescribed. Thank you!  ----- Message ----- From: Gretta Comer POUR, NP Sent: 03/09/2024   2:41 PM EDT To: Katlyn D West, NP  Katlyn, patient wanted these labs drawn in our office so I am forwarding to you :)   ----- Message ----- From: Interface, Lab In Three Zero One Sent: 03/09/2024  12:04 PM EDT To: Comer POUR Gretta, NP

## 2024-03-17 NOTE — Progress Notes (Signed)
 POST OPERATIVE OFFICE NOTE    CC:  F/u for surgery  HPI:  This is a 63 y.o. female who is s/p left sided TCAR by Dr. Gretta on 02/12/2024 due to high-grade asymptomatic left ICA stenosis.  She has a known right ICA occlusion.  She unfortunately was readmitted to the hospital postoperatively due to a GI bleed 1 week after surgery.  Her Eliquis  and aspirin  were discontinued.  She however was able to continue her Plavix .  Aspirin  is still being held however she reports her H&H has been stable.  She denies any strokelike symptoms including slurring of speech, changes in vision, one-sided weakness.  She initially was having severe headaches postoperatively however she now only has occasional headaches which are easily managed with Tylenol .  She believes her left neck incision has healed.  No Known Allergies  Current Outpatient Medications  Medication Sig Dispense Refill   acetaminophen  (TYLENOL ) 500 MG tablet Take 2 tablets (1,000 mg total) by mouth every 8 (eight) hours as needed.     albuterol  (VENTOLIN  HFA) 108 (90 Base) MCG/ACT inhaler INHALE 2 PUFFS INTO THE LUNGS EVERY 4-6 HOURS AS NEEDED FOR TIGHTNESS/WHEEZING 8.5 each 0   amiodarone  (PACERONE ) 200 MG tablet Take 1 tablet (200 mg total) by mouth 2 (two) times daily for 14 days, THEN 1 tablet (200 mg total) daily. 58 tablet 0   amLODipine  (NORVASC ) 5 MG tablet Take 1 tablet (5 mg total) by mouth daily. 180 tablet 3   Ascorbic Acid  (VITAMIN C ) 1000 MG tablet Take 2,000 mg by mouth daily.     atorvastatin  (LIPITOR ) 80 MG tablet TAKE 1 TABLET (80 MG TOTAL) BY MOUTH DAILY FOR CHOLESTEROL 90 tablet 0   busPIRone  (BUSPAR ) 5 MG tablet Take 1 tablet (5 mg total) by mouth 2 (two) times daily. For anxiety 180 tablet 0   clopidogrel  (PLAVIX ) 75 MG tablet Take 1 tablet (75 mg total) by mouth daily. 30 tablet 6   escitalopram  (LEXAPRO ) 20 MG tablet TAKE 1 TABLET BY MOUTH DAILY FOR ANXIETY 90 tablet 1   [Paused] ferrous sulfate  325 (65 FE) MG tablet Take 650  mg by mouth daily with breakfast.     lisinopril  (ZESTRIL ) 30 MG tablet Take 30 mg by mouth daily.     metoprolol  tartrate (LOPRESSOR ) 50 MG tablet Take 1 tablet (50 mg total) by mouth 2 (two) times daily. 60 tablet 0   pantoprazole  (PROTONIX ) 40 MG tablet Take 1 tablet (40 mg total) by mouth 2 (two) times daily. 60 tablet 0   pyridoxine  (B-6) 100 MG tablet Take 100 mg by mouth daily.     sodium chloride  1 g tablet TAKE 1 TABLET (1 G TOTAL) BY MOUTH 2 (TWO) TIMES DAILY WITH A MEAL. TO INCREASE SODIUM 180 tablet 0   vitamin B-12 (CYANOCOBALAMIN ) 1000 MCG tablet Take 1,000 mcg by mouth daily.     vitamin E  180 MG (400 UNITS) capsule Take 400 Units by mouth daily.     WIXELA INHUB 250-50 MCG/ACT AEPB INHALE 1 PUFF BY MOUTH INTO THE LUNGS IN THE MORNING AND AT BEDTIME. 180 each 1   No current facility-administered medications for this visit.     ROS:  See HPI  Physical Exam:  Vitals:   03/17/24 1352 03/17/24 1356  BP: (!) 187/89 (!) 155/89  Pulse: 80 82  Temp: 97.7 F (36.5 C)   TempSrc: Temporal   Weight: 156 lb 1.6 oz (70.8 kg)     Incision: Left neck incision healed Extremities:  Symmetrical radial pulses; moving all extremities well Neuro: Cranial nerves grossly intact  Assessment/Plan:  This is a 63 y.o. female who is s/p: Left-sided TCAR by Dr. Gretta due to asymptomatic high-grade stenosis.  Ms. Emery is a 63 year old female who underwent left-sided TCAR by Dr. Gretta due to high-grade asymptomatic stenosis.  She has a known right ICA occlusion.  She has not experienced any strokelike symptoms postoperatively however she did have a GI bleed.  Aspirin  and Eliquis  were discontinued.  She continues to take only Plavix .  She reports her H&H has been stable.  In the event that she has any further GI bleeding I will be okay from our standpoint to switch from Plavix  to 81 mg of aspirin  daily.  We will repeat carotid duplex in 9 months per protocol.   Donnice Sender, PA-C [facility] [phone]  Clinic MD:  Sheree on call

## 2024-03-17 NOTE — Telephone Encounter (Signed)
 Left message to call back.

## 2024-03-19 ENCOUNTER — Telehealth: Payer: Self-pay | Admitting: Cardiology

## 2024-03-19 MED ORDER — METOPROLOL TARTRATE 50 MG PO TABS: 50.0000 mg | ORAL_TABLET | Freq: Two times a day (BID) | ORAL | 3 refills | Status: AC

## 2024-03-19 MED ORDER — AMIODARONE HCL 200 MG PO TABS: 200.0000 mg | ORAL_TABLET | Freq: Every day | ORAL | 3 refills | Status: DC

## 2024-03-19 NOTE — Telephone Encounter (Signed)
 *  STAT* If patient is at the pharmacy, call can be transferred to refill team.   1. Which medications need to be refilled? (please list name of each medication and dose if known)   metoprolol  tartrate (LOPRESSOR ) 50 MG tablet  amiodarone  (PACERONE ) 200 MG tablet    2. Which pharmacy/location (including street and city if local pharmacy) is medication to be sent to?  [id number] - [place], [place] - [address]    3. Do they need a 30 day or 90 day supply? 30 day

## 2024-03-19 NOTE — Telephone Encounter (Signed)
-----   Message from Katlyn D West sent at 03/13/2024  6:50 PM EDT ----- Please let Ms. Neuser know that her hemoglobin and hematocrit are improved, there is no evidence of infection. Her kidney function and electrolytes are overall normal, her sodium level is slightly  low, she is currently on sodium tablets from her PCP, please remind her to take as prescribed. Thank you!  ----- Message ----- From: Gretta Comer POUR, NP Sent: 03/09/2024   2:41 PM EDT To: Katlyn D West, NP  Katlyn, patient wanted these labs drawn in our office so I am forwarding to you :)   ----- Message ----- From: Interface, Lab In Three Zero One Sent: 03/09/2024  12:04 PM EDT To: Comer POUR Gretta, NP

## 2024-03-19 NOTE — Telephone Encounter (Signed)
 RX sent in

## 2024-03-19 NOTE — Telephone Encounter (Signed)
 Called patient advised of below they verbalized understanding.

## 2024-03-20 ENCOUNTER — Telehealth: Payer: Self-pay | Admitting: Primary Care

## 2024-03-20 ENCOUNTER — Other Ambulatory Visit: Payer: Self-pay

## 2024-03-20 NOTE — Telephone Encounter (Signed)
 Please notify patient of this phone call.  What I understand she is out on short-term disability.  Does she know anything about this?

## 2024-03-20 NOTE — Telephone Encounter (Signed)
 Copied from CRM #[id number]. Topic: General - Other >> Mar 20, 2024  2:47 PM Lavanda D wrote: Reason for CRM: [facility] confirming most recent office visit/any follow up, relayed. Please call back to advise of any work restrictions. She will be faxing a request as well.  CB: [phone] ext: [phone]

## 2024-03-23 ENCOUNTER — Telehealth: Payer: Self-pay

## 2024-03-23 NOTE — Telephone Encounter (Signed)
 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 She is now having issues with afib. States she has appt on 04/09/24 with cardiology to determine if she has any restrictions for returning to work. She then has f/u with Mallie on 04/17/24 to discuss the recommendations and get ppw fille out. Holding PPW in my desk until 9/12 appt.

## 2024-03-23 NOTE — Telephone Encounter (Signed)
 Copied from CRM #[id number]. Topic: General - Other >> Mar 23, 2024  1:42 PM Turkey A wrote: Reason for CRM: Patient was returning [facility] call-please call

## 2024-03-23 NOTE — Telephone Encounter (Signed)
 Unable to reach patient. Left voicemail to return call to our office.

## 2024-03-23 NOTE — Telephone Encounter (Signed)
 Added to appropriate encounter.

## 2024-03-24 NOTE — Telephone Encounter (Signed)
 Noted

## 2024-03-30 ENCOUNTER — Ambulatory Visit
Admission: RE | Admit: 2024-03-30 | Discharge: 2024-03-30 | Disposition: A | Discharge status: Home / Self Care | Source: Ambulatory Visit | Attending: Student in an Organized Health Care Education/Training Program | Admitting: Student in an Organized Health Care Education/Training Program

## 2024-03-30 DIAGNOSIS — R911 Solitary pulmonary nodule: Secondary | ICD-10-CM | POA: Insufficient documentation

## 2024-03-30 DIAGNOSIS — W19XXXA Unspecified fall, initial encounter: Secondary | ICD-10-CM | POA: Insufficient documentation

## 2024-03-30 DIAGNOSIS — R233 Spontaneous ecchymoses: Secondary | ICD-10-CM | POA: Insufficient documentation

## 2024-03-30 DIAGNOSIS — J439 Emphysema, unspecified: Secondary | ICD-10-CM | POA: Diagnosis not present

## 2024-03-30 DIAGNOSIS — I700 Atherosclerosis of aorta: Secondary | ICD-10-CM | POA: Diagnosis not present

## 2024-03-30 LAB — GLUCOSE, CAPILLARY: Glucose-Capillary: 110 mg/dL — ABNORMAL HIGH (ref 70–99)

## 2024-03-30 MED ORDER — FLUDEOXYGLUCOSE F - 18 (FDG) INJECTION
8.5400 | Freq: Once | INTRAVENOUS | Status: AC | PRN
  Administered 2024-03-30: 8.54 via INTRAVENOUS

## 2024-04-07 ENCOUNTER — Ambulatory Visit: Admitting: Student in an Organized Health Care Education/Training Program

## 2024-04-07 ENCOUNTER — Encounter: Payer: Self-pay | Admitting: Student in an Organized Health Care Education/Training Program

## 2024-04-07 VITALS — BP 156/80 | HR 66 | Temp 97.1°F | Ht 63.0 in | Wt 160.2 lb

## 2024-04-07 DIAGNOSIS — Z87891 Personal history of nicotine dependence: Secondary | ICD-10-CM

## 2024-04-07 DIAGNOSIS — R911 Solitary pulmonary nodule: Secondary | ICD-10-CM | POA: Diagnosis not present

## 2024-04-07 DIAGNOSIS — J432 Centrilobular emphysema: Secondary | ICD-10-CM | POA: Diagnosis not present

## 2024-04-07 DIAGNOSIS — J449 Chronic obstructive pulmonary disease, unspecified: Secondary | ICD-10-CM

## 2024-04-07 MED ORDER — SPIRIVA RESPIMAT 2.5 MCG/ACT IN AERS: 2.0000 | INHALATION_SPRAY | Freq: Every day | RESPIRATORY_TRACT | 12 refills | Status: DC

## 2024-04-07 MED ORDER — FLUTICASONE-SALMETEROL 250-50 MCG/ACT IN AEPB: 1.0000 | INHALATION_SPRAY | Freq: Two times a day (BID) | RESPIRATORY_TRACT | 12 refills | Status: AC

## 2024-04-07 NOTE — Progress Notes (Unsigned)
 Synopsis: Referred in *** by Gretta Comer POUR, NP  Assessment & Plan:   1. Lung nodule  Repeat CT in 6 mo;nths. Stable nodule on PET. No FDG avidity  - CT CHEST WO CONTRAST; Future  2. Centrilobular emphysema (HCC) (Primary)  Continue ICS/LABA with wixela, incruse was out of formulary, will try spiriva   - Tiotropium Bromide Monohydrate  (SPIRIVA  RESPIMAT) 2.5 MCG/ACT AERS; Inhale 2 puffs into the lungs daily.  Dispense: 60 each; Refill: 12  3. Chronic obstructive pulmonary disease, unspecified COPD type (HCC) *** - fluticasone -salmeterol (WIXELA INHUB) 250-50 MCG/ACT AEPB; Inhale 1 puff into the lungs in the morning and at bedtime.  Dispense: 60 each; Refill: 12   Return in about 6 months (around 10/05/2024).  I spent *** minutes caring for this patient today, including {EM billing:28027}  Belva November, MD [facility] 04/07/2024 10:47 AM    End of visit medications:  Meds ordered this encounter  Medications   Tiotropium Bromide Monohydrate  (SPIRIVA  RESPIMAT) 2.5 MCG/ACT AERS    Sig: Inhale 2 puffs into the lungs daily.    Dispense:  60 each    Refill:  12   fluticasone -salmeterol (WIXELA INHUB) 250-50 MCG/ACT AEPB    Sig: Inhale 1 puff into the lungs in the morning and at bedtime.    Dispense:  60 each    Refill:  12     Current Outpatient Medications:    acetaminophen  (TYLENOL ) 500 MG tablet, Take 2 tablets (1,000 mg total) by mouth every 8 (eight) hours as needed., Disp: , Rfl:    albuterol  (VENTOLIN  HFA) 108 (90 Base) MCG/ACT inhaler, INHALE 2 PUFFS INTO THE LUNGS EVERY 4-6 HOURS AS NEEDED FOR TIGHTNESS/WHEEZING, Disp: 8.5 each, Rfl: 0   amiodarone  (PACERONE ) 200 MG tablet, Take 1 tablet (200 mg total) by mouth daily., Disp: 90 tablet, Rfl: 3   amLODipine  (NORVASC ) 5 MG tablet, Take 1 tablet (5 mg total) by mouth daily., Disp: 180 tablet, Rfl: 3   Ascorbic Acid  (VITAMIN C ) 1000 MG tablet, Take 2,000 mg by mouth daily., Disp: , Rfl:     atorvastatin  (LIPITOR ) 80 MG tablet, TAKE 1 TABLET (80 MG TOTAL) BY MOUTH DAILY FOR CHOLESTEROL, Disp: 90 tablet, Rfl: 0   busPIRone  (BUSPAR ) 5 MG tablet, Take 1 tablet (5 mg total) by mouth 2 (two) times daily. For anxiety, Disp: 180 tablet, Rfl: 0   clopidogrel  (PLAVIX ) 75 MG tablet, Take 1 tablet (75 mg total) by mouth daily., Disp: 30 tablet, Rfl: 6   escitalopram  (LEXAPRO ) 20 MG tablet, TAKE 1 TABLET BY MOUTH DAILY FOR ANXIETY, Disp: 90 tablet, Rfl: 1   [Paused] ferrous sulfate  325 (65 FE) MG tablet, Take 650 mg by mouth daily with breakfast., Disp: , Rfl:    lisinopril  (ZESTRIL ) 30 MG tablet, Take 30 mg by mouth daily., Disp: , Rfl:    metoprolol  tartrate (LOPRESSOR ) 50 MG tablet, Take 1 tablet (50 mg total) by mouth 2 (two) times daily., Disp: 180 tablet, Rfl: 3   pantoprazole  (PROTONIX ) 40 MG tablet, Take 1 tablet (40 mg total) by mouth 2 (two) times daily., Disp: 60 tablet, Rfl: 0   pyridoxine  (B-6) 100 MG tablet, Take 100 mg by mouth daily., Disp: , Rfl:    sodium chloride  1 g tablet, TAKE 1 TABLET (1 G TOTAL) BY MOUTH 2 (TWO) TIMES DAILY WITH A MEAL. TO INCREASE SODIUM, Disp: 180 tablet, Rfl: 0   Tiotropium Bromide Monohydrate  (SPIRIVA  RESPIMAT) 2.5 MCG/ACT AERS, Inhale 2 puffs into the lungs daily.,  Disp: 60 each, Rfl: 12   vitamin B-12 (CYANOCOBALAMIN ) 1000 MCG tablet, Take 1,000 mcg by mouth daily., Disp: , Rfl:    vitamin E  180 MG (400 UNITS) capsule, Take 400 Units by mouth daily., Disp: , Rfl:    fluticasone -salmeterol (WIXELA INHUB) 250-50 MCG/ACT AEPB, Inhale 1 puff into the lungs in the morning and at bedtime., Disp: 60 each, Rfl: 12   Subjective:   PATIENT ID: Denise [name] GENDER: female DOB: November 21, 1960, MRN: [id number]  Chief Complaint  Patient presents with   Medical Management of Chronic Issues    Hoarseness and Feels like something is in the back of her throat since TCAR surgery in July. SOB. Wheezing. Cough with clear sputum.     HPI ***  Ancillary  information including prior medications, full medical/surgical/family/social histories, and PFTs (when available) are listed below and have been reviewed.   {PULM QUESTIONNAIRES (Optional):33196}  ROS   Objective:   Vitals:   04/07/24 1015  BP: (!) 156/80  Pulse: 66  Temp: (!) 97.1 F (36.2 C)  SpO2: 94%  Weight: 160 lb 3.2 oz (72.7 kg)  Height: 5' 3 (1.6 m)   94% on *** LPM *** RA BMI Readings from Last 3 Encounters:  04/07/24 28.38 kg/m  03/17/24 27.65 kg/m  02/27/24 27.46 kg/m   Wt Readings from Last 3 Encounters:  04/07/24 160 lb 3.2 oz (72.7 kg)  03/17/24 156 lb 1.6 oz (70.8 kg)  02/27/24 155 lb (70.3 kg)    Physical Exam    Ancillary Information    Past Medical History:  Diagnosis Date   Allergy    Anemia 12/24/2019   Anxiety    Aortic insufficiency    Aortic stenosis    Arthritis    Carotid artery disease (HCC)    Centrilobular emphysema (HCC) 11/2023   Chickenpox    Cholecystitis with cholelithiasis 03/31/2012   Closed fracture of L4 transverse process s/p traumatic mechanical fall 10/26/2023   COPD (chronic obstructive pulmonary disease) (HCC)    Coronary artery disease    Depression    Diastolic dysfunction    Dyspnea    with exertion   GERD (gastroesophageal reflux disease)    Headache    Heart murmur    High anion gap metabolic acidosis 10/26/2023   History of blood transfusion 2021   HLD (hyperlipidemia)    Hypertension 03/31/2012   Hyponatremia in setting of SIADH    Left lower lobe pulmonary nodule 11/2023   Long-term use of aspirin  therapy    Lumbar compression fracture s/p traumatic mechanical fall; L4 superior endplate    Pneumonia    x 2   PVD (peripheral vascular disease) (HCC)    Shingles    SIADH (syndrome of inappropriate ADH production) (HCC)    Traumatic mechanical fall 10/26/2023   a.) s/p traumatic fall 10/26/2023 reuslting in multiple RIGHT rib (5th - 7th) fractures (required dchest tube), l$ transverse process  fracture, L4 superior endplate compression fracture, large volume post-traumatic myofacial/subcutaneous gas collection in chest     Family History  Problem Relation Age of Onset   Arthritis Mother    Cervical cancer Mother    Heart disease Father    Hypertension Father      Past Surgical History:  Procedure Laterality Date   BIOPSY  12/24/2019   Procedure: BIOPSY;  Surgeon: Teressa Toribio SQUIBB, MD;  Location: [facility] ENDOSCOPY;  Service: Endoscopy;;   BRONCHIAL BIOPSY  12/19/2023   Procedure: BRONCHOSCOPY, WITH BIOPSY;  Surgeon: Isadora Hose,  MD;  Location: [facility] ENDOSCOPY;  Service: Pulmonary;;   BRONCHIAL BRUSHINGS  12/19/2023   Procedure: BRONCHOSCOPY, WITH BRUSH BIOPSY;  Surgeon: Isadora Hose, MD;  Location: [facility] ENDOSCOPY;  Service: Pulmonary;;   BRONCHIAL NEEDLE ASPIRATION BIOPSY  12/19/2023   Procedure: BRONCHOSCOPY, WITH NEEDLE ASPIRATION BIOPSY;  Surgeon: Isadora Hose, MD;  Location: [facility] ENDOSCOPY;  Service: Pulmonary;;   BRONCHIAL WASHINGS  12/19/2023   Procedure: IRRIGATION, BRONCHUS;  Surgeon: Isadora Hose, MD;  Location: [facility] ENDOSCOPY;  Service: Pulmonary;;   BRONCHOSCOPY, WITH BIOPSY USING ELECTROMAGNETIC NAVIGATION Bilateral 12/19/2023   Procedure: ROBOTIC ASSISTED NAVIGATIONAL BRONCHOSCOPY;  Surgeon: Isadora Hose, MD;  Location: [facility] ENDOSCOPY;  Service: Pulmonary;  Laterality: Bilateral;   CATARACT EXTRACTION Bilateral 2021   CHOLECYSTECTOMY  03/30/2012   Procedure: LAPAROSCOPIC CHOLECYSTECTOMY WITH INTRAOPERATIVE CHOLANGIOGRAM;  Surgeon: Morene ONEIDA Olives, MD;  Location: [facility] ORS;  Service: General;  Laterality: N/A;   COLONOSCOPY WITH PROPOFOL  N/A 12/24/2019   Procedure: COLONOSCOPY WITH PROPOFOL ;  Surgeon: Teressa Toribio SQUIBB, MD;  Location: [facility] ENDOSCOPY;  Service: Endoscopy;  Laterality: N/A;   ESOPHAGOGASTRODUODENOSCOPY (EGD) WITH PROPOFOL  N/A 12/24/2019   Procedure: ESOPHAGOGASTRODUODENOSCOPY (EGD) WITH PROPOFOL ;  Surgeon: Teressa Toribio SQUIBB, MD;  Location: [facility] ENDOSCOPY;   Service: Endoscopy;  Laterality: N/A;   HOT HEMOSTASIS N/A 12/24/2019   Procedure: HOT HEMOSTASIS (ARGON PLASMA COAGULATION/BICAP);  Surgeon: Teressa Toribio SQUIBB, MD;  Location: [facility] ENDOSCOPY;  Service: Endoscopy;  Laterality: N/A;   POLYPECTOMY  12/24/2019   Procedure: POLYPECTOMY;  Surgeon: Teressa Toribio SQUIBB, MD;  Location: [facility] ENDOSCOPY;  Service: Endoscopy;;   TRANSCAROTID ARTERY REVASCULARIZATION  Left 02/12/2024   Procedure: TRANSCAROTID ARTERY REVASCULARIZATION (TCAR);  Surgeon: Gretta Lonni PARAS, MD;  Location: [facility] OR;  Service: Vascular;  Laterality: Left;   TUBAL LIGATION  1997   VIDEO BRONCHOSCOPY WITH ENDOBRONCHIAL ULTRASOUND  12/19/2023   Procedure: BRONCHOSCOPY, WITH EBUS;  Surgeon: Isadora Hose, MD;  Location: [facility] ENDOSCOPY;  Service: Pulmonary;;   VIDEO BRONCHOSCOPY WITH RADIAL ENDOBRONCHIAL ULTRASOUND  12/19/2023   Procedure: VIDEO BRONCHOSCOPY WITH RADIAL ENDOBRONCHIAL ULTRASOUND;  Surgeon: Isadora Hose, MD;  Location: [facility] ENDOSCOPY;  Service: Pulmonary;;    Social History   Socioeconomic History   Marital status: Widowed    Spouse name: Not on file   Number of children: 2   Years of education: Not on file   Highest education level: 12th grade  Occupational History   Not on file  Tobacco Use   Smoking status: Former    Current packs/day: 0.00    Average packs/day: 0.8 packs/day for 38.0 years (28.5 ttl pk-yrs)    Types: Cigarettes    Start date: 05/06/1985    Quit date: 05/07/2023    Years since quitting: 0.9   Smokeless tobacco: Never  Vaping Use   Vaping status: Never Used  Substance and Sexual Activity   Alcohol use: Yes    Alcohol/week: 1.0 standard drink of alcohol    Types: 1 Standard drinks or equivalent per week    Comment: occasionally   Drug use: No   Sexual activity: Not Currently    Birth control/protection: Post-menopausal  Other Topics Concern   Not on file  Social History Narrative   Widow.    2 children.   Works in Clinical biochemist.   Enjoys  reading, cooking.    Social Drivers of Health   Financial Resource Strain: Patient Declined (01/28/2024)   Overall Financial Resource Strain (CARDIA)    Difficulty of Paying Living Expenses: Patient declined  Food Insecurity: No Food Insecurity (02/22/2024)  Hunger Vital Sign    Worried About Running Out of Food in the Last Year: Never true    Ran Out of Food in the Last Year: Never true  Recent Concern: Food Insecurity - Food Insecurity Present (02/19/2024)   Hunger Vital Sign    Worried About Running Out of Food in the Last Year: Sometimes true    Ran Out of Food in the Last Year: Sometimes true  Transportation Needs: No Transportation Needs (02/19/2024)   PRAPARE - Administrator, Civil Service (Medical): No    Lack of Transportation (Non-Medical): No  Recent Concern: Transportation Needs - Unmet Transportation Needs (11/29/2023)   PRAPARE - Transportation    Lack of Transportation (Medical): No    Lack of Transportation (Non-Medical): Yes  Physical Activity: Insufficiently Active (01/28/2024)   Exercise Vital Sign    Days of Exercise per Week: 4 days    Minutes of Exercise per Session: 20 min  Stress: Stress Concern Present (01/28/2024)   [name] of Occupational Health - Occupational Stress Questionnaire    Feeling of Stress: Very much  Social Connections: Unknown (02/19/2024)   Social Connection and Isolation Panel    Frequency of Communication with Friends and Family: More than three times a week    Frequency of Social Gatherings with Friends and Family: More than three times a week    Attends Religious Services: Patient declined    Database administrator or Organizations: Patient declined    Attends Banker Meetings: Patient declined    Marital Status: Patient declined  Recent Concern: Social Connections - Socially Isolated (01/28/2024)   Social Connection and Isolation Panel    Frequency of Communication with Friends and Family: Three times a  week    Frequency of Social Gatherings with Friends and Family: Patient declined    Attends Religious Services: Patient declined    Active Member of Clubs or Organizations: No    Attends Engineer, structural: Not on file    Marital Status: Widowed  Intimate Partner Violence: Not At Risk (02/19/2024)   Humiliation, Afraid, Rape, and Kick questionnaire    Fear of Current or Ex-Partner: No    Emotionally Abused: No    Physically Abused: No    Sexually Abused: No     No Known Allergies   CBC    Component Value Date/Time   WBC 9.5 03/09/2024 0949   RBC 4.50 03/09/2024 0949   HGB 15.1 (H) 03/09/2024 0949   HCT 45.3 03/09/2024 0949   PLT 336.0 03/09/2024 0949   MCV 100.7 (H) 03/09/2024 0949   MCH 32.2 02/23/2024 0650   MCHC 33.3 03/09/2024 0949   RDW 19.9 (H) 03/09/2024 0949   LYMPHSABS 1.2 02/23/2024 0650   MONOABS 0.9 02/23/2024 0650   EOSABS 0.3 02/23/2024 0650   BASOSABS 0.0 02/23/2024 0650    Pulmonary Functions Testing Results:    Latest Ref Rng & Units 12/02/2023   10:34 AM  PFT Results  FVC-Pre L 2.64   FVC-Predicted Pre % 84   FVC-Post L 2.66   FVC-Predicted Post % 84   Pre FEV1/FVC % % 48   Post FEV1/FCV % % 48   FEV1-Pre L 1.26   FEV1-Predicted Pre % 52   FEV1-Post L 1.29   DLCO uncorrected ml/min/mmHg 4.57   DLCO UNC% % 23   DLCO corrected ml/min/mmHg 4.50   DLCO COR %Predicted % 23   DLVA Predicted % 56   TLC L 4.99  TLC % Predicted % 101   RV % Predicted % 149     Outpatient Medications Prior to Visit  Medication Sig Dispense Refill   acetaminophen  (TYLENOL ) 500 MG tablet Take 2 tablets (1,000 mg total) by mouth every 8 (eight) hours as needed.     albuterol  (VENTOLIN  HFA) 108 (90 Base) MCG/ACT inhaler INHALE 2 PUFFS INTO THE LUNGS EVERY 4-6 HOURS AS NEEDED FOR TIGHTNESS/WHEEZING 8.5 each 0   amiodarone  (PACERONE ) 200 MG tablet Take 1 tablet (200 mg total) by mouth daily. 90 tablet 3   amLODipine  (NORVASC ) 5 MG tablet Take 1 tablet (5 mg  total) by mouth daily. 180 tablet 3   Ascorbic Acid  (VITAMIN C ) 1000 MG tablet Take 2,000 mg by mouth daily.     atorvastatin  (LIPITOR ) 80 MG tablet TAKE 1 TABLET (80 MG TOTAL) BY MOUTH DAILY FOR CHOLESTEROL 90 tablet 0   busPIRone  (BUSPAR ) 5 MG tablet Take 1 tablet (5 mg total) by mouth 2 (two) times daily. For anxiety 180 tablet 0   clopidogrel  (PLAVIX ) 75 MG tablet Take 1 tablet (75 mg total) by mouth daily. 30 tablet 6   escitalopram  (LEXAPRO ) 20 MG tablet TAKE 1 TABLET BY MOUTH DAILY FOR ANXIETY 90 tablet 1   ferrous sulfate  325 (65 FE) MG tablet Take 650 mg by mouth daily with breakfast.     lisinopril  (ZESTRIL ) 30 MG tablet Take 30 mg by mouth daily.     metoprolol  tartrate (LOPRESSOR ) 50 MG tablet Take 1 tablet (50 mg total) by mouth 2 (two) times daily. 180 tablet 3   pantoprazole  (PROTONIX ) 40 MG tablet Take 1 tablet (40 mg total) by mouth 2 (two) times daily. 60 tablet 0   pyridoxine  (B-6) 100 MG tablet Take 100 mg by mouth daily.     sodium chloride  1 g tablet TAKE 1 TABLET (1 G TOTAL) BY MOUTH 2 (TWO) TIMES DAILY WITH A MEAL. TO INCREASE SODIUM 180 tablet 0   vitamin B-12 (CYANOCOBALAMIN ) 1000 MCG tablet Take 1,000 mcg by mouth daily.     vitamin E  180 MG (400 UNITS) capsule Take 400 Units by mouth daily.     WIXELA INHUB 250-50 MCG/ACT AEPB INHALE 1 PUFF BY MOUTH INTO THE LUNGS IN THE MORNING AND AT BEDTIME. 180 each 1   No facility-administered medications prior to visit.

## 2024-04-07 NOTE — Progress Notes (Unsigned)
 Cardiology Office Note    Date:  04/10/2024  ID:  Denise [name], DOB 06-Feb-1961, MRN [id number] PCP:  Gretta Comer POUR, NP  Cardiologist:  Shelda Bruckner, MD  Electrophysiologist:  None   Chief Complaint: Follow up for atrial fibrillation   History of Present Illness: .    Denise [name] is a 63 y.o. female with visit-pertinent history of nonobstructive CAD per coronary CTA in 10/2023, hyperdynamic LV systolic function with a EF of 70 to 75% on echo on 10/15/2023, carotid artery disease, hypertension, hyperlipidemia, obesity history of cigarette smoking, SIADH.   Denise [name] was originally seen by Dr. Alveta in 08/2023 for preoperative evaluation in preparation for carotid surgery.  Coronary CTA in 10/2023 with CAC 659, 90th percentile, severe mixed plaque in the proximal LAD with 79%, 25 to 49% in LCx/RCA, severe lipomatous hypertrophy of the anterior atrial septum, FFR negative.  Echo in 10/2023 showed EF 70-75%, moderate LVH, moderate LAE, mild AI, moderate AS.  There was also a lung nodule being followed by pulmonology, bronc negative for malignant cells but plan for surveillance PET in 03/2024.  Patient was initially planned to have her right carotid surgery earlier this year then had admission for complex traumatic mechanical fall resulting in multiple fractures.  Patient also known to have abnormal ABIs.  In 2024 she had blurry vision in the right eye, carotid duplex suggested high-grade stenosis bilaterally, prompting CTA to confirm the same.  Follow-up duplex in 6/25 showed right carotid totally occluded, LICA was 80 to 99%.  On 02/12/2024 patient was brought in for left TCAR with plan for medical therapy on the right.  Per discussion with PACU nurse she was reported to be in trigeminy during the case and in bigeminy in PACU with sinus tachycardia.  Heart rate in improved to the 80s and ectopy resolved.  Patient denied any chest tightness or worsening dyspnea, noted to have chronic  baseline shortness of breath.  On 02/13/2024 patient went into atrial fibrillation and was started on metoprolol , was in and out of atrial fibrillation overnight.  Patient was cardiac unaware.  Patient was discharged on 02/14/2024.    On 02/16/2024 patient presented to [facility] ED for rectal bleeding, lab work revealed normal hemoglobin.  It was recommended she undergo CTA with GI consult and admission however patient declined.   On 02/18/2024 patient presented back to the emergency department with symptomatic GI bleed, she had been discharged on aspirin , Plavix  and Eliquis .  Patient was found to be in atrial fibrillation with RVR, was started on amiodarone  to attempt to maintain sinus rhythm.  Patient's blood pressure was 220/110 and 160/80.  GI was consulted with no recommendation for intervention as hemoglobin was stable.  Patient was continued on Plavix  75 mg daily however her aspirin  and Eliquis  were held.  Patient was started on amiodarone  200 mg twice daily for 14 days then 200 mg daily thereafter.  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   On 02/21/2024 patient returned to [facility] ED for altered mental status and near syncope with potential seizure.  Patient was observed to experience tonic-clonic seizure by witnesses.  Patient was admitted for further evaluation.  MRI brain and EEG were without acute findings.  Her  lisinopril  was resumed and metoprolol  was continued.  Amlodipine  was held.  Echo on 02/22/2024 indicated moderate aortic valve stenosis, and notes it was stated that prior study was performed during atrial fibrillation, exaggerating measured aortic valve stenosis gradients, aortic stenosis was noted  to be moderate not severe.  Patient had no further seizures and symptoms resolved, she was discharged home on 02/23/2024 with recommendations for neurology follow-up and no driving.  Patient was last seen in clinic on 02/23/2024 for follow-up.  Patient reported that she been having significant anxiety following her hospital admission.  She reported that her blood pressure been elevated at doctors offices however reported that she is not checking at home and she is concerned will be elevated which results in her increased anxiety.  She reported occasional fleeting chest pain that had been previously reported and not felt to be ischemic in nature.  Patient endorsed a feeling of slight fluttering for a few seconds, denied any sustained palpitations.  She denied any further GI bleeding.  Patient was agreeable to wearing a 1 week cardiac monitor to assess for atrial fibrillation and determine burden of PVCs.  Patient's preliminary results from her cardiac monitor showed an average heart rate of 70 bpm, ranging from 53 to 103 bpm, she occasional PVCs at 4.7% burden.  Patient notes that she only wore the monitor for 1 day and 17 hours as unfortunately when she applied it it was near her scar from her recent surgery and was painful, when she went to move the device it unfortunately fell off.  Today she presents for follow-up.  She reports that she is doing ok, she notes her headaches while intermittently present are improved. Notes an occasional sharp fleeting pain in her chest that quickly resolves and does not associate with exertion, she does note an episode in which she was exerting herself and had some slight chest discomfort that resolves with rest.  Patient reports that she has not been consistently checking her blood pressure at home, notes this results in significant anxiety when she goes to check her blood pressure. She notes ongoing shortness of breath which she feels is likely related to her COPD, denies any  significant changes.  She denies any presyncope or syncope, notes that she does have some occasional problems with some slight dizziness, she is unsure if this is related to her medications, notes presents following her TCAR.  She notes an occasional palpitation described as a skipped beat, denies any sustained palpitations.  Patient denies bleeding problems.  ROS: .   Today she denies lower extremity edema, fatigue, palpitations, melena, hematuria, hemoptysis, diaphoresis, weakness, presyncope, syncope, orthopnea, and PND.  All other systems are reviewed and otherwise negative. Studies Reviewed: SABRA   EKG:  EKG is ordered today, personally reviewed, demonstrating  EKG Interpretation Date/Time:  Thursday April 09 2024 11:25:14 EDT Ventricular Rate:  65 PR Interval:  166 QRS Duration:  78 QT Interval:  442 QTC Calculation: 459 R Axis:   58  Text Interpretation: Normal sinus rhythm Septal infarct , age undetermined When compared with ECG of 27-Feb-2024 08:47, Premature ventricular complexes are no longer Present Confirmed by Kynadie Yaun [phone]) on 04/10/2024 9:20:57 PM   CV Studies: Cardiac studies reviewed are outlined and summarized above. Otherwise please see EMR for full report. Cardiac Studies & Procedures   ______________________________________________________________________________________________     ECHOCARDIOGRAM  ECHOCARDIOGRAM COMPLETE 02/22/2024  Narrative ECHOCARDIOGRAM REPORT    Patient Name:   KAZI MONTORO Date of Exam: 02/22/2024 Medical  Rec #:  [id number]           Height:       63.0 in Accession #:    [id number]          Weight:       159.2 lb Date of Birth:  [birth year]            BSA:          1.755 m Patient Age:    63 years            BP:           170/78 mmHg Patient Gender: F                   HR:           63 bpm. Exam Location:  Inpatient  Procedure: 2D Echo, Cardiac Doppler and Color Doppler (Both Spectral and Color Flow Doppler were utilized  during procedure).  Indications:    Syncope R55  History:        Patient has prior history of Echocardiogram examinations, most recent 02/18/2024. Cardiomegaly, CAD, COPD, Arrythmias:Atrial Flutter, Signs/Symptoms:Dyspnea; Risk Factors:Hypertension, Dyslipidemia and Current Smoker.  Sonographer:    Thea Norlander RCS Referring Phys: [phone] [facility] GARBA   Sonographer Comments: No parasternal window. IMPRESSIONS   1. Left ventricular ejection fraction, by estimation, is 60 to 65%. The left ventricle has normal function. The left ventricle has no regional wall motion abnormalities. There is mild concentric left ventricular hypertrophy. Left ventricular diastolic parameters are consistent with Grade I diastolic dysfunction (impaired relaxation). 2. Right ventricular systolic function is normal. The right ventricular size is normal. Tricuspid regurgitation signal is inadequate for assessing PA pressure. 3. Left atrial size was mild to moderately dilated. 4. The mitral valve is degenerative. Mild mitral valve regurgitation. No evidence of mitral stenosis. Moderate mitral annular calcification. 5. The aortic valve is calcified. Aortic valve regurgitation is mild. Moderate aortic valve stenosis. Aortic valve mean gradient measures 24.0 mmHg. Aortic valve Vmax measures 3.32 m/s. Aortic valve acceleration time measures 83 msec.  Comparison(s): No significant change from prior study. Prior images reviewed side by side. Prior study was performed during atrial fibrillation, exaggerating the measured aortic valve stenosis gradients. Aortic stenosis is definitely present, but is not severe.  FINDINGS Left Ventricle: Left ventricular ejection fraction, by estimation, is 60 to 65%. The left ventricle has normal function. The left ventricle has no regional wall motion abnormalities. The left ventricular internal cavity size was normal in size. There is mild concentric left ventricular hypertrophy. Left  ventricular diastolic parameters are consistent with Grade I diastolic dysfunction (impaired relaxation). Normal left ventricular filling pressure.  Right Ventricle: The right ventricular size is normal. No increase in right ventricular wall thickness. Right ventricular systolic function is normal. Tricuspid regurgitation signal is inadequate for assessing PA pressure.  Left Atrium: Left atrial size was mild to moderately dilated.  Right Atrium: Right atrial size was normal in size.  Pericardium: There is no evidence of pericardial effusion.  Mitral Valve: The mitral valve is degenerative in appearance. Moderate mitral annular calcification. Mild mitral valve regurgitation. No evidence of mitral valve stenosis.  Tricuspid Valve: The tricuspid valve is normal in structure. Tricuspid valve regurgitation is trivial.  Aortic Valve: The aortic valve is calcified. Aortic valve regurgitation is mild. Moderate aortic stenosis is present. Aortic valve mean gradient measures 24.0 mmHg. Aortic valve peak gradient measures 44.0 mmHg. Aortic valve area, by VTI measures 0.98 cm.  Pulmonic Valve: The pulmonic valve was not well visualized.  Aorta: The aortic root was not well visualized.  IAS/Shunts: No atrial level shunt detected by color flow Doppler.   LEFT VENTRICLE PLAX 2D LVOT diam:     2.00 cm   Diastology LV SV:         68        LV e' medial:    6.96 cm/s LV SV Index:   39        LV E/e' medial:  13.5 LVOT Area:     3.14 cm  LV e' lateral:   7.72 cm/s LV E/e' lateral: 12.2   RIGHT VENTRICLE             IVC RV S prime:     13.80 cm/s  IVC diam: 1.60 cm TAPSE (M-mode): 1.9 cm  LEFT ATRIUM             Index        RIGHT ATRIUM           Index LA Vol (A2C):   58.8 ml 33.51 ml/m  RA Area:     16.05 cm LA Vol (A4C):   37.6 ml 21.43 ml/m  RA Volume:   36.25 ml  20.66 ml/m LA Biplane Vol: 49.2 ml 28.04 ml/m AORTIC VALVE AV Area (Vmax):    0.96 cm AV Area (Vmean):   0.91 cm AV  Area (VTI):     0.98 cm AV Vmax:           331.50 cm/s AV Vmean:          225.000 cm/s AV VTI:            0.690 m AV Peak Grad:      44.0 mmHg AV Mean Grad:      24.0 mmHg LVOT Vmax:         101.00 cm/s LVOT Vmean:        65.500 cm/s LVOT VTI:          0.216 m LVOT/AV VTI ratio: 0.31  MITRAL VALVE                TRICUSPID VALVE MV Area (PHT): 2.83 cm     TR Peak grad:   18.7 mmHg MV Decel Time: 268 msec     TR Vmax:        216.00 cm/s MV E velocity: 93.80 cm/s MV A velocity: 122.00 cm/s  SHUNTS MV E/A ratio:  0.77         Systemic VTI:  0.22 m Systemic Diam: 2.00 cm  Jerel Croitoru MD Electronically signed by Jerel Balding MD Signature Date/Time: 02/22/2024/12:26:33 PM    Final      CT SCANS  CT CORONARY MORPH W/CTA COR W/SCORE 10/11/2023  Addendum 10/26/2023  5:22 AM ADDENDUM REPORT: 10/26/2023 05:20  EXAM: OVER-READ INTERPRETATION  CT CHEST  The following report is an over-read performed by radiologist Dr. VEAR Frederic [facility] on 10/26/2023. This over-read does not include interpretation of cardiac or coronary anatomy or pathology. The coronary CTA interpretation by the cardiologist is attached.  COMPARISON:  Cardiac CTA 04/29/2023.  FINDINGS: Lobulated superior segment left lower lobe lung nodule redemonstrated, now up to 16 mm long axis and has enlarged by about 1 mm since September. Underlying centrilobular emphysema which was more apparent on the previous exam.  Otherwise stable visible lungs and chest. No hilar or mediastinal lymphadenopathy identified. Cardiac findings detailed separately.  IMPRESSION: Slowly enlarging left  lower lobe lung nodule, which was evaluated with PET-CT in 2022, but considering increased risk factors such as underlying Emphysema (ICD10-J43.9), Recommend referral to [facility]) if not already done, and additional evaluation to exclude a slow growing carcinoma.  These  results will be called to the ordering clinician or representative by the Radiologist Assistant, and communication documented in the PACS or [employer].   Electronically Signed By: VEAR Hurst M.D. On: 10/26/2023 05:20  Narrative CLINICAL DATA:  Chest pain  EXAM: Cardiac/Coronary CTA  TECHNIQUE: A non-contrast, gated CT scan was obtained with axial slices of 3 mm through the heart for calcium  scoring. Calcium  scoring was performed using the Agatston method. A 120 kV prospective, gated, contrast cardiac scan was obtained. Gantry rotation speed was 250 msecs and collimation was 0.6 mm. Two sublingual nitroglycerin  tablets (0.8 mg) were given. The 3D data set was reconstructed in 5% intervals of the 35-75% of the R-R cycle. Diastolic phases were analyzed on a dedicated workstation using MPR, MIP, and VRT modes. The patient received 95 cc of contrast.  FINDINGS: Image quality: Excellent.  Noise artifact is: Limited.  Coronary Arteries:  Normal coronary origin.  Right dominance.  Left main: The left main is a large caliber vessel with a normal take off from the left coronary cusp that trifurcates to form a left anterior descending artery, ramus, and a left circumflex artery. There is no plaque or stenosis.  Left anterior descending artery: The proximal LAD contains severe mixed density plaque (70-99%). The mid LAD contains mild calcified plaque (25-49%). The distal LAD is patent. D1 contains mild calcified plaque (25-49%)  Ramus intermedius: Patent with no evidence of plaque or stenosis.  Left circumflex artery: The LCX is non-dominant. There is mild mixed density plaque (25-49%) in the proximal and mid segments. The LCX gives off 2 patent obtuse marginal branches.  Right coronary artery: The RCA is dominant with normal take off from the right coronary cusp. There is mild mixed density plaque in the proximal, mid, and distal segments (25-49%). The RCA terminates as  a PDA and right posterolateral branch without evidence of plaque or stenosis.  Right Atrium: Right atrial size is within normal limits.  Right Ventricle: The right ventricular cavity is within normal limits.  Left Atrium: Left atrial size is normal in size with no left atrial appendage filling defect. Severe lipomatous hypertrophy of the interatrial septum extending into the RA, correlate with echocardiography.  Left Ventricle: The ventricular cavity size is within normal limits.  Pulmonary arteries: Normal in size.  Pulmonary veins: Normal pulmonary venous drainage.  Pericardium: Normal thickness without significant effusion or calcium  present.  Cardiac valves: The aortic valve is trileaflet without significant calcification. The mitral valve is normal with mild annular calcium .  Aorta: Normal caliber without significant disease.  Extra-cardiac findings: See attached radiology report for non-cardiac structures.  IMPRESSION: 1. Coronary calcium  score of 659. This was 98th percentile for age-, sex, and race-matched controls.  2. Total plaque volume 708 mm3 which is 91st percentile for age- and sex-matched controls (calcified plaque 152 mm3; non-calcified plaque 556 mm3). TPV is severe.  3. Normal coronary origin with right dominance.  4. Severe mixed density plaque in the proximal LAD (70-99%).  5. Mild mixed density plaque in the LCX (25-49%).  6. Mild mixed density plaque in the RCA (25-49%).  7. Severe lipomatous hypertrophy of the interatrial septum extending into the RA, correlate with echocardiography.  RECOMMENDATIONS: 1. CAD-RADS 4: Severe stenosis.  CT FFR will be submitted. Consider symptom-guided anti-ischemic pharmacotherapy as well as risk factor modification per guideline directed care. Invasive coronary angiography recommended with revascularization per published guideline statements.  Darryle Decent, MD  Electronically Signed: By: Darryle Decent M.D. On: 10/13/2023 19:25   CT SCANS  CT CARDIAC SCORING (SELF PAY ONLY) 04/29/2023  Addendum 05/13/2023 12:35 PM ADDENDUM REPORT: 05/13/2023 12:32  EXAM: OVER-READ INTERPRETATION  CT CHEST  The following report is an over-read performed by radiologist Dr. Toribio Cove [facility] on 05/13/2023. This over-read does not include interpretation of cardiac or coronary anatomy or pathology. The coronary calcium  score interpretation by the cardiologist is attached.  COMPARISON:  Chest CT 04/24/2021.  FINDINGS: Atherosclerotic calcifications in the thoracic aorta. Diffuse bronchial wall thickening with moderate centrilobular and mild paraseptal emphysema. Again noted is a macrolobulated nodule in the superior segment of the left lower lobe (axial image 9 of series 10) which has increased in size and currently measures 1.5 x 1.4 cm (previously 1.1 x 1.0 cm on 04/24/2021). This may have some internal fatty attenuation, but no calcifications. Within the visualized portions of the thorax there are no other new suspicious appearing pulmonary nodules or masses, there is no acute consolidative airspace disease, no pleural effusions, no pneumothorax and no lymphadenopathy. Visualized portions of the upper abdomen are unremarkable. There are no aggressive appearing lytic or blastic lesions noted in the visualized portions of the skeleton.  IMPRESSION: 1. Enlarging left lower lobe pulmonary nodule. Given the slow enlargement compared to prior examinations and lack of substantial hypermetabolism on prior PET-CT (as well as the possibility of internal fatty attenuation), the possibility of a benign lesion such as a hamartoma or slow growing neoplasm such as a carcinoid tumor is favored, although slow growing primary bronchogenic adenocarcinoma is not excluded (but not favored based on the morphology of the lesion). 2. Aortic Atherosclerosis (ICD10-I70.0) and Emphysema  (ICD10-J43.9).   Electronically Signed By: Toribio Aye M.D. On: 05/13/2023 12:32  Narrative CLINICAL DATA:  Risk stratification  EXAM: Coronary Calcium  Score  TECHNIQUE: The patient was scanned on a Siemens Somatom scanner. Axial non-contrast 3 mm slices were carried out through the heart. The data set was analyzed on a dedicated work station and scored using the Agatson method.  FINDINGS: Non-cardiac: See separate report from [facility].  Ascending Aorta: Normal size, aortic wall calcifications  Pericardium: Normal  Coronary arteries: Normal origin of left and right coronary arteries. Distribution of arterial calcifications if present, as noted below;  LM 0  LAD 241  LCx 26.2  RCA 258  Total 526  IMPRESSION AND RECOMMENDATION: 1. Coronary calcium  score of 526. This was 98th percentile for age and sex matched control.  2. CAC >300 in LAD, LCx, RCA. CAC-DRS A3/N3.  3. Recommend aspirin  and statin if no contraindication.  4. Recommend cardiology consultation.  5. Continue heart healthy lifestyle and risk factor modification.  Electronically Signed: By: Redell Cave M.D. On: 04/29/2023 15:38     ______________________________________________________________________________________________       Current Reported Medications:.    Current Meds  Medication Sig   acetaminophen  (TYLENOL ) 500 MG tablet Take 2 tablets (1,000 mg total) by mouth every 8 (eight) hours as needed.   albuterol  (VENTOLIN  HFA) 108 (90 Base) MCG/ACT inhaler INHALE 2 PUFFS INTO THE LUNGS EVERY 4-6 HOURS AS NEEDED FOR TIGHTNESS/WHEEZING   amiodarone  (PACERONE ) 200 MG tablet Take 1 tablet (200 mg total) by mouth daily.   amLODipine  (NORVASC ) 10 MG tablet  Take 1 tablet (10 mg total) by mouth daily.   Ascorbic Acid  (VITAMIN C ) 1000 MG tablet Take 2,000 mg by mouth daily.   atorvastatin  (LIPITOR ) 80 MG tablet TAKE 1 TABLET (80 MG TOTAL) BY MOUTH DAILY FOR CHOLESTEROL    busPIRone  (BUSPAR ) 5 MG tablet Take 1 tablet (5 mg total) by mouth 2 (two) times daily. For anxiety   clopidogrel  (PLAVIX ) 75 MG tablet Take 1 tablet (75 mg total) by mouth daily.   escitalopram  (LEXAPRO ) 20 MG tablet TAKE 1 TABLET BY MOUTH DAILY FOR ANXIETY   [Paused] ferrous sulfate  325 (65 FE) MG tablet Take 650 mg by mouth daily with breakfast.   fluticasone -salmeterol (WIXELA INHUB) 250-50 MCG/ACT AEPB Inhale 1 puff into the lungs in the morning and at bedtime.   lisinopril  (ZESTRIL ) 30 MG tablet Take 30 mg by mouth daily.   metoprolol  tartrate (LOPRESSOR ) 50 MG tablet Take 1 tablet (50 mg total) by mouth 2 (two) times daily.   pantoprazole  (PROTONIX ) 40 MG tablet Take 1 tablet (40 mg total) by mouth 2 (two) times daily.   pyridoxine  (B-6) 100 MG tablet Take 100 mg by mouth daily.   sodium chloride  1 g tablet TAKE 1 TABLET (1 G TOTAL) BY MOUTH 2 (TWO) TIMES DAILY WITH A MEAL. TO INCREASE SODIUM   Tiotropium Bromide Monohydrate  (SPIRIVA  RESPIMAT) 2.5 MCG/ACT AERS Inhale 2 puffs into the lungs daily.   vitamin B-12 (CYANOCOBALAMIN ) 1000 MCG tablet Take 1,000 mcg by mouth daily.   vitamin E  180 MG (400 UNITS) capsule Take 400 Units by mouth daily.   [DISCONTINUED] amLODipine  (NORVASC ) 5 MG tablet Take 1 tablet (5 mg total) by mouth daily.    Physical Exam:    VS:  BP (!) 148/78   Pulse 65   Ht 5' 3 (1.6 m)   Wt 159 lb 12.8 oz (72.5 kg)   SpO2 95%   BMI 28.31 kg/m    Wt Readings from Last 3 Encounters:  04/10/24 160 lb 12.8 oz (72.9 kg)  04/09/24 159 lb 12.8 oz (72.5 kg)  04/07/24 160 lb 3.2 oz (72.7 kg)    GEN: Well nourished, well developed in no acute distress NECK: No JVD; No carotid bruits CARDIAC: RRR, no murmurs, rubs, gallops RESPIRATORY:  Clear to auscultation without rales, wheezing or rhonchi  ABDOMEN: Soft, non-tender, non-distended EXTREMITIES:  No edema; No acute deformity     Asessement and Plan:.    Atrial fibrillation: Patient with history of frequent  PACs, during TCAR was noted to have ventricular bigeminy intraoperatively, she went to atrial fibrillation on 7/10. Patient initially managed on oral metoprolol  however later was re-admitted and found to be in afib with RVR.  Patient was started on amiodarone  to maintain sinus rhythm in setting of GI bleed as her Eliquis  needed to be held. Patient's preliminary results from her cardiac monitor showed an average heart rate of 70 bpm, ranging from 53 to 103 bpm, she occasional PVCs at 4.7% burden.  Patient notes that she only wore the monitor for 1 day and 17 hours as unfortunately when she applied it it was near her scar from her recent surgery and was painful, when she went to move the device it unfortunately fell off.  Her EKG today indicates that she remains in sinus rhythm, patient denies any feeling of sustained palpitations, notes an occasional skipped beat sensation likely PVCs. Reviewed with vascular team, they are requesting patient remain on at least aspirin  81 mg daily, Plavix  can be discontinued.  Will discuss with Dr. Lonni transitioning patient from Plavix  to Eliquis  and aspirin  81 mg daily.  Ideally if able to transition would discontinue amiodarone  given patient's history of severe COPD.  Could consider repeat cardiac monitor a few weeks after discontinuation of amiodarone  to monitor for recurrent atrial fibrillation and more frequent PVCs. Check CBC, CMET, TSH and free T4.   CAD/hypertension: Coronary CTA in 10/2023 with suggestion of severe LAD disease but FFR was negative and nonobstructive disease elsewhere. Today she reports an occasional sharp fleeting pain.  She does note an episode in which she had some chest discomfort after walking for a prolonged period, noted improvement with rest.  Patient reports that she has not been monitoring her blood pressure at home as when she checks it it is extremely high resulting in increased anxiety and further increasing her blood pressure.  Will  increase her amlodipine  to 10 mg daily.  Moderate AS, mild AI: Noted on echo in 10/2023, repeat echo on 02/22/2024 indicated moderate aortic valve stenosis. Today she reports stable shortness of breath, she feels related to COPD.  She appears euvolemic and well compensated on exam.  Anticipate repeat echo in 02/2025 for monitoring.  Hyperlipidemia: Last lipid profile in 02/14/2024 indicated LDL 6 7, triglycerides 70, HDL 56.  Continue Lipitor  80 mg daily.  Bilateral carotid disease: Right carotid occluded, left carotid artery s/p TCAR.  She is followed by vascular surgery.   Disposition: F/u with Ansley Mangiapane, NP in 6 weeks.   Signed, Steele Ledonne D Selim Durden, NP

## 2024-04-09 ENCOUNTER — Encounter: Payer: Self-pay | Admitting: Cardiology

## 2024-04-09 ENCOUNTER — Ambulatory Visit: Attending: Cardiology | Admitting: Cardiology

## 2024-04-09 VITALS — BP 148/78 | HR 65 | Ht 63.0 in | Wt 159.8 lb

## 2024-04-09 DIAGNOSIS — I2510 Atherosclerotic heart disease of native coronary artery without angina pectoris: Secondary | ICD-10-CM | POA: Diagnosis not present

## 2024-04-09 DIAGNOSIS — I480 Paroxysmal atrial fibrillation: Secondary | ICD-10-CM

## 2024-04-09 DIAGNOSIS — I350 Nonrheumatic aortic (valve) stenosis: Secondary | ICD-10-CM | POA: Diagnosis not present

## 2024-04-09 DIAGNOSIS — I10 Essential (primary) hypertension: Secondary | ICD-10-CM

## 2024-04-09 DIAGNOSIS — E782 Mixed hyperlipidemia: Secondary | ICD-10-CM

## 2024-04-09 DIAGNOSIS — I6523 Occlusion and stenosis of bilateral carotid arteries: Secondary | ICD-10-CM

## 2024-04-09 MED ORDER — AMLODIPINE BESYLATE 10 MG PO TABS: 10.0000 mg | ORAL_TABLET | Freq: Every day | ORAL | 3 refills | Status: AC

## 2024-04-09 NOTE — Patient Instructions (Addendum)
 Medication Instructions:    Increase Amlodipine  to 10 mg daily    *If you need a refill on your cardiac medications before your next appointment, please call your pharmacy*   Lab Work:today CMP CBC TSH T4,Free  If you have labs (blood work) drawn today and your tests are completely normal, you will receive your results only by: MyChart Message (if you have MyChart) OR A paper copy in the mail If you have any lab test that is abnormal or we need to change your treatment, we will call you to review the results.   Testing/Procedures:  Not needed  Follow-Up: At [facility], you and your health needs are our priority.  As part of our continuing mission to provide you with exceptional heart care, we have created designated Provider Care Teams.  These Care Teams include your primary Cardiologist (physician) and Advanced Practice Providers (APPs -  Physician Assistants and Nurse Practitioners) who all work together to provide you with the care you need, when you need it.     Your next appointment:   6 week(s)  The format for your next appointment:   In Person  Provider:   Katlyn West, NP

## 2024-04-10 ENCOUNTER — Telehealth: Payer: Self-pay

## 2024-04-10 ENCOUNTER — Encounter: Payer: Self-pay | Admitting: Cardiology

## 2024-04-10 ENCOUNTER — Ambulatory Visit (INDEPENDENT_AMBULATORY_CARE_PROVIDER_SITE_OTHER): Admitting: Diagnostic Neuroimaging

## 2024-04-10 ENCOUNTER — Encounter: Payer: Self-pay | Admitting: Diagnostic Neuroimaging

## 2024-04-10 VITALS — BP 148/76 | HR 60 | Ht 63.0 in | Wt 160.8 lb

## 2024-04-10 DIAGNOSIS — R55 Syncope and collapse: Secondary | ICD-10-CM

## 2024-04-10 LAB — COMPREHENSIVE METABOLIC PANEL WITH GFR
ALT: 29 IU/L (ref 0–32)
AST: 32 IU/L (ref 0–40)
Albumin: 4.2 g/dL (ref 3.9–4.9)
Alkaline Phosphatase: 106 IU/L (ref 44–121)
BUN/Creatinine Ratio: 15 (ref 12–28)
BUN: 10 mg/dL (ref 8–27)
Bilirubin Total: 0.3 mg/dL (ref 0.0–1.2)
CO2: 23 mmol/L (ref 20–29)
Calcium: 9.6 mg/dL (ref 8.7–10.3)
Chloride: 97 mmol/L (ref 96–106)
Creatinine, Ser: 0.66 mg/dL (ref 0.57–1.00)
Globulin, Total: 2.6 g/dL (ref 1.5–4.5)
Glucose: 78 mg/dL (ref 70–99)
Potassium: 4.9 mmol/L (ref 3.5–5.2)
Sodium: 134 mmol/L (ref 134–144)
Total Protein: 6.8 g/dL (ref 6.0–8.5)
eGFR: 99 mL/min/1.73 (ref >59)

## 2024-04-10 LAB — CBC
Hematocrit: 43.2 % (ref 34.0–46.6)
Hemoglobin: 13.7 g/dL (ref 11.1–15.9)
MCH: 32.9 pg (ref 26.6–33.0)
MCHC: 31.7 g/dL (ref 31.5–35.7)
MCV: 104 fL — ABNORMAL HIGH (ref 79–97)
Platelets: 340 x10E3/uL (ref 150–450)
RBC: 4.17 x10E6/uL (ref 3.77–5.28)
RDW: 14 % (ref 11.7–15.4)
WBC: 8.4 x10E3/uL (ref 3.4–10.8)

## 2024-04-10 LAB — TSH: TSH: 2.2 u[IU]/mL (ref 0.450–4.500)

## 2024-04-10 LAB — T4, FREE: Free T4: 1.28 ng/dL (ref 0.82–1.77)

## 2024-04-10 NOTE — Progress Notes (Addendum)
 [facility]  PATIENT: Denise [name] DOB: 11/03/1960  REFERRING CLINICIAN: Raenelle Donalda HERO, MD HISTORY FROM: patient  REASON FOR VISIT: new consult   HISTORICAL  CHIEF COMPLAINT:  Chief Complaint  Patient presents with   New Patient (Initial Visit)    RM 6, Pt alone, referred here by PCP for seizure-like activity. Pt states she has been having a lot of medical issues lately. Pt states recently she had taken more BP medication than she should have and had a syncopal episode, was evaluated in ED and they thought she had a possible seizure. Pt states since then she has not had another episode like that. Also states she saw cardiologist yesterday.    HISTORY OF PRESENT ILLNESS:   63 year old female here for evaluation of syncope versus seizure.  History of bilateral carotid disease, hypertension, hyperlipidemia, peripheral vascular disease, aortic stenosis, paroxysmal atrial fibrillation.  02/12/2024 patient underwent left carotid stenting.  Postoperative day 1 she developed atrial fibrillation and was started on Eliquis .  She was discharged home.  She returned on 02/16/2024 due to blood in stool.  Eliquis  was stopped and she was discharged home.  02/17/2024 patient returned to the hospital due to atrial flutter and hypertension.  Medications were adjusted.  She was discharged home.  02/21/2024 patient was noting increased blood pressure at home took some extra medications and then passed out.  No tongue biting or incontinence.  There was some confusion for several minutes afterwards.  She had some tinnitus and prodromal lightheadedness.  He was evaluated for convulsive syncope versus seizure.  Since the time patient is doing well.  No further events.    REVIEW OF SYSTEMS: Full 14 system review of systems performed and negative with exception of: As per HPI.  ALLERGIES: No Known Allergies  HOME MEDICATIONS: Outpatient Medications Prior to Visit  Medication Sig  Dispense Refill   acetaminophen  (TYLENOL ) 500 MG tablet Take 2 tablets (1,000 mg total) by mouth every 8 (eight) hours as needed.     albuterol  (VENTOLIN  HFA) 108 (90 Base) MCG/ACT inhaler INHALE 2 PUFFS INTO THE LUNGS EVERY 4-6 HOURS AS NEEDED FOR TIGHTNESS/WHEEZING 8.5 each 0   amiodarone  (PACERONE ) 200 MG tablet Take 1 tablet (200 mg total) by mouth daily. 90 tablet 3   amLODipine  (NORVASC ) 10 MG tablet Take 1 tablet (10 mg total) by mouth daily. 90 tablet 3   Ascorbic Acid  (VITAMIN C ) 1000 MG tablet Take 2,000 mg by mouth daily.     atorvastatin  (LIPITOR ) 80 MG tablet TAKE 1 TABLET (80 MG TOTAL) BY MOUTH DAILY FOR CHOLESTEROL 90 tablet 0   busPIRone  (BUSPAR ) 5 MG tablet Take 1 tablet (5 mg total) by mouth 2 (two) times daily. For anxiety 180 tablet 0   clopidogrel  (PLAVIX ) 75 MG tablet Take 1 tablet (75 mg total) by mouth daily. 30 tablet 6   escitalopram  (LEXAPRO ) 20 MG tablet TAKE 1 TABLET BY MOUTH DAILY FOR ANXIETY 90 tablet 1   fluticasone -salmeterol (WIXELA INHUB) 250-50 MCG/ACT AEPB Inhale 1 puff into the lungs in the morning and at bedtime. 60 each 12   lisinopril  (ZESTRIL ) 30 MG tablet Take 30 mg by mouth daily.     metoprolol  tartrate (LOPRESSOR ) 50 MG tablet Take 1 tablet (50 mg total) by mouth 2 (two) times daily. 180 tablet 3   pantoprazole  (PROTONIX ) 40 MG tablet Take 1 tablet (40 mg total) by mouth 2 (two) times daily. 60 tablet 0   pyridoxine  (B-6) 100 MG tablet Take 100  mg by mouth daily.     sodium chloride  1 g tablet TAKE 1 TABLET (1 G TOTAL) BY MOUTH 2 (TWO) TIMES DAILY WITH A MEAL. TO INCREASE SODIUM 180 tablet 0   Tiotropium Bromide Monohydrate  (SPIRIVA  RESPIMAT) 2.5 MCG/ACT AERS Inhale 2 puffs into the lungs daily. 60 each 12   vitamin B-12 (CYANOCOBALAMIN ) 1000 MCG tablet Take 1,000 mcg by mouth daily.     vitamin E  180 MG (400 UNITS) capsule Take 400 Units by mouth daily.     ferrous sulfate  325 (65 FE) MG tablet Take 650 mg by mouth daily with breakfast.     No  facility-administered medications prior to visit.    PAST MEDICAL HISTORY: Past Medical History:  Diagnosis Date   Allergy    Anemia 12/24/2019   Anxiety    Aortic insufficiency    Aortic stenosis    Arthritis    Carotid artery disease (HCC)    Centrilobular emphysema (HCC) 11/2023   Chickenpox    Cholecystitis with cholelithiasis 03/31/2012   Closed fracture of L4 transverse process s/p traumatic mechanical fall 10/26/2023   COPD (chronic obstructive pulmonary disease) (HCC)    Coronary artery disease    Depression    Diastolic dysfunction    Dyspnea    with exertion   GERD (gastroesophageal reflux disease)    Headache    Heart murmur    High anion gap metabolic acidosis 10/26/2023   History of blood transfusion 2021   HLD (hyperlipidemia)    Hypertension 03/31/2012   Hyponatremia in setting of SIADH    Left lower lobe pulmonary nodule 11/2023   Long-term use of aspirin  therapy    Lumbar compression fracture s/p traumatic mechanical fall; L4 superior endplate    Pneumonia    x 2   PVD (peripheral vascular disease) (HCC)    Shingles    SIADH (syndrome of inappropriate ADH production) (HCC)    Traumatic mechanical fall 10/26/2023   a.) s/p traumatic fall 10/26/2023 reuslting in multiple RIGHT rib (5th - 7th) fractures (required dchest tube), l$ transverse process fracture, L4 superior endplate compression fracture, large volume post-traumatic myofacial/subcutaneous gas collection in chest    PAST SURGICAL HISTORY: Past Surgical History:  Procedure Laterality Date   BIOPSY  12/24/2019   Procedure: BIOPSY;  Surgeon: Teressa Toribio SQUIBB, MD;  Location: [facility] ENDOSCOPY;  Service: Endoscopy;;   BRONCHIAL BIOPSY  12/19/2023   Procedure: BRONCHOSCOPY, WITH BIOPSY;  Surgeon: Isadora Hose, MD;  Location: [facility] ENDOSCOPY;  Service: Pulmonary;;   BRONCHIAL BRUSHINGS  12/19/2023   Procedure: BRONCHOSCOPY, WITH BRUSH BIOPSY;  Surgeon: Isadora Hose, MD;  Location: [facility] ENDOSCOPY;  Service:  Pulmonary;;   BRONCHIAL NEEDLE ASPIRATION BIOPSY  12/19/2023   Procedure: BRONCHOSCOPY, WITH NEEDLE ASPIRATION BIOPSY;  Surgeon: Isadora Hose, MD;  Location: [facility] ENDOSCOPY;  Service: Pulmonary;;   BRONCHIAL WASHINGS  12/19/2023   Procedure: IRRIGATION, BRONCHUS;  Surgeon: Isadora Hose, MD;  Location: [facility] ENDOSCOPY;  Service: Pulmonary;;   BRONCHOSCOPY, WITH BIOPSY USING ELECTROMAGNETIC NAVIGATION Bilateral 12/19/2023   Procedure: ROBOTIC ASSISTED NAVIGATIONAL BRONCHOSCOPY;  Surgeon: Isadora Hose, MD;  Location: [facility] ENDOSCOPY;  Service: Pulmonary;  Laterality: Bilateral;   CATARACT EXTRACTION Bilateral 2021   CHOLECYSTECTOMY  03/30/2012   Procedure: LAPAROSCOPIC CHOLECYSTECTOMY WITH INTRAOPERATIVE CHOLANGIOGRAM;  Surgeon: Morene ONEIDA Olives, MD;  Location: [facility] ORS;  Service: General;  Laterality: N/A;   COLONOSCOPY WITH PROPOFOL  N/A 12/24/2019   Procedure: COLONOSCOPY WITH PROPOFOL ;  Surgeon: Teressa Toribio SQUIBB, MD;  Location[facility] ENDOSCOPY;  Service: Endoscopy;  Laterality: N/A;   ESOPHAGOGASTRODUODENOSCOPY (EGD) WITH PROPOFOL  N/A 12/24/2019   Procedure: ESOPHAGOGASTRODUODENOSCOPY (EGD) WITH PROPOFOL ;  Surgeon: Teressa Toribio SQUIBB, MD;  Location: [facility] ENDOSCOPY;  Service: Endoscopy;  Laterality: N/A;   HOT HEMOSTASIS N/A 12/24/2019   Procedure: HOT HEMOSTASIS (ARGON PLASMA COAGULATION/BICAP);  Surgeon: Teressa Toribio SQUIBB, MD;  Location: [facility] ENDOSCOPY;  Service: Endoscopy;  Laterality: N/A;   POLYPECTOMY  12/24/2019   Procedure: POLYPECTOMY;  Surgeon: Teressa Toribio SQUIBB, MD;  Location: [facility] ENDOSCOPY;  Service: Endoscopy;;   TRANSCAROTID ARTERY REVASCULARIZATION  Left 02/12/2024   Procedure: TRANSCAROTID ARTERY REVASCULARIZATION (TCAR);  Surgeon: Gretta Lonni PARAS, MD;  Location: [facility] OR;  Service: Vascular;  Laterality: Left;   TUBAL LIGATION  1997   VIDEO BRONCHOSCOPY WITH ENDOBRONCHIAL ULTRASOUND  12/19/2023   Procedure: BRONCHOSCOPY, WITH EBUS;  Surgeon: Isadora Hose, MD;  Location: [facility] ENDOSCOPY;   Service: Pulmonary;;   VIDEO BRONCHOSCOPY WITH RADIAL ENDOBRONCHIAL ULTRASOUND  12/19/2023   Procedure: VIDEO BRONCHOSCOPY WITH RADIAL ENDOBRONCHIAL ULTRASOUND;  Surgeon: Isadora Hose, MD;  Location: [facility] ENDOSCOPY;  Service: Pulmonary;;    FAMILY HISTORY: Family History  Problem Relation Age of Onset   Arthritis Mother    Cervical cancer Mother    Heart disease Father    Hypertension Father     SOCIAL HISTORY: Social History   Socioeconomic History   Marital status: Widowed    Spouse name: Not on file   Number of children: 2   Years of education: Not on file   Highest education level: 12th grade  Occupational History   Occupation: Clinical biochemist Rep  Tobacco Use   Smoking status: Former    Current packs/day: 0.00    Average packs/day: 0.8 packs/day for 38.0 years (28.5 ttl pk-yrs)    Types: Cigarettes    Start date: 05/06/1985    Quit date: 05/07/2023    Years since quitting: 0.9   Smokeless tobacco: Never  Vaping Use   Vaping status: Never Used  Substance and Sexual Activity   Alcohol use: Yes    Alcohol/week: 1.0 standard drink of alcohol    Types: 1 Standard drinks or equivalent per week    Comment: occasionally   Drug use: No   Sexual activity: Not Currently    Birth control/protection: Post-menopausal  Other Topics Concern   Not on file  Social History Narrative   Widow.    2 children.   Works in Clinical biochemist. Currently on disability as of 04/2023.   Enjoys reading, cooking.    Social Drivers of Health   Financial Resource Strain: Patient Declined (01/28/2024)   Overall Financial Resource Strain (CARDIA)    Difficulty of Paying Living Expenses: Patient declined  Food Insecurity: No Food Insecurity (02/22/2024)   Hunger Vital Sign    Worried About Running Out of Food in the Last Year: Never true    Ran Out of Food in the Last Year: Never true  Recent Concern: Food Insecurity - Food Insecurity Present (02/19/2024)   Hunger Vital Sign    Worried About  Running Out of Food in the Last Year: Sometimes true    Ran Out of Food in the Last Year: Sometimes true  Transportation Needs: No Transportation Needs (02/19/2024)   PRAPARE - Administrator, Civil Service (Medical): No    Lack of Transportation (Non-Medical): No  Recent Concern: Transportation Needs - Unmet Transportation Needs (11/29/2023)   PRAPARE - Administrator, Civil Service (Medical): No    Lack of Transportation (Non-Medical): Yes  Physical Activity: Insufficiently Active (01/28/2024)   Exercise Vital Sign    Days of Exercise per Week: 4 days    Minutes of Exercise per Session: 20 min  Stress: Stress Concern Present (01/28/2024)   [name] of Occupational Health - Occupational Stress Questionnaire    Feeling of Stress: Very much  Social Connections: Unknown (02/19/2024)   Social Connection and Isolation Panel    Frequency of Communication with Friends and Family: More than three times a week    Frequency of Social Gatherings with Friends and Family: More than three times a week    Attends Religious Services: Patient declined    Database administrator or Organizations: Patient declined    Attends Banker Meetings: Patient declined    Marital Status: Patient declined  Recent Concern: Social Connections - Socially Isolated (01/28/2024)   Social Connection and Isolation Panel    Frequency of Communication with Friends and Family: Three times a week    Frequency of Social Gatherings with Friends and Family: Patient declined    Attends Religious Services: Patient declined    Active Member of Clubs or Organizations: No    Attends Engineer, structural: Not on file    Marital Status: Widowed  Intimate Partner Violence: Not At Risk (02/19/2024)   Humiliation, Afraid, Rape, and Kick questionnaire    Fear of Current or Ex-Partner: No    Emotionally Abused: No    Physically Abused: No    Sexually Abused: No     PHYSICAL  EXAM  GENERAL EXAM/CONSTITUTIONAL: Vitals:  Vitals:   04/10/24 0908  BP: (!) 148/76  Pulse: 60  SpO2: 95%  Weight: 160 lb 12.8 oz (72.9 kg)  Height: 5' 3 (1.6 m)   Body mass index is 28.48 kg/m. Wt Readings from Last 3 Encounters:  04/10/24 160 lb 12.8 oz (72.9 kg)  04/09/24 159 lb 12.8 oz (72.5 kg)  04/07/24 160 lb 3.2 oz (72.7 kg)   Patient is in no distress; well developed, nourished and groomed; neck is supple  CARDIOVASCULAR: Examination of carotid arteries is normal; no carotid bruits Regular rate and rhythm, BLOWING SYSTOLIC MURMUR OVER RIGHT PRECORDIAL REGION Examination of peripheral vascular system by observation and palpation is normal  EYES: Ophthalmoscopic exam of optic discs and posterior segments is normal; no papilledema or hemorrhages No results found.  MUSCULOSKELETAL: Gait, strength, tone, movements noted in Neurologic exam below  NEUROLOGIC: MENTAL STATUS:      No data to display         awake, alert, oriented to person, place and time recent and remote memory intact normal attention and concentration language fluent, comprehension intact, naming intact fund of knowledge appropriate  CRANIAL NERVE:  2nd - no papilledema on fundoscopic exam 2nd, 3rd, 4th, 6th - RIGHT PUPIL NO REACTION (no direct or consensual response); LEFT PUPIL AND REACTIVE; DECR VISION ACUITY IN RIGHT EYE (can detect light perception centrally; able to count fingers in right field slightly); LEFT EYE FIELD NORMAL; extraocular muscles intact, no nystagmus 5th - facial sensation symmetric 7th - facial strength symmetric 8th - hearing intact 9th - palate elevates symmetrically, uvula midline 11th - shoulder shrug symmetric 12th - tongue protrusion midline  MOTOR:  normal bulk and tone, full strength in the BUE, BLE  SENSORY:  normal and symmetric to light touch, temperature, vibration  COORDINATION:  finger-nose-finger, fine finger movements  normal  REFLEXES:  deep tendon reflexes TRACE and symmetric  GAIT/STATION:  narrow based gait  DIAGNOSTIC DATA (LABS, IMAGING, TESTING) - I reviewed patient records, labs, notes, testing and imaging myself where available.  Lab Results  Component Value Date   WBC 8.4 04/09/2024   HGB 13.7 04/09/2024   HCT 43.2 04/09/2024   MCV 104 (H) 04/09/2024   PLT 340 04/09/2024      Component Value Date/Time   NA 134 04/09/2024 1330   K 4.9 04/09/2024 1330   CL 97 04/09/2024 1330   CO2 23 04/09/2024 1330   GLUCOSE 78 04/09/2024 1330   GLUCOSE 121 (H) 03/09/2024 0949   BUN 10 04/09/2024 1330   CREATININE 0.66 04/09/2024 1330   CREATININE 0.54 11/29/2023 1516   CALCIUM  9.6 04/09/2024 1330   PROT 6.8 04/09/2024 1330   ALBUMIN  4.2 04/09/2024 1330   AST 32 04/09/2024 1330   ALT 29 04/09/2024 1330   ALKPHOS 106 04/09/2024 1330   BILITOT 0.3 04/09/2024 1330   GFRNONAA >60 02/23/2024 0650   GFRAA >60 12/24/2019 0326   Lab Results  Component Value Date   CHOL 137 02/13/2024   HDL 56 02/13/2024   LDLCALC 67 02/13/2024   LDLDIRECT 168.0 02/24/2021   TRIG 70 02/13/2024   CHOLHDL 2.4 02/13/2024   Lab Results  Component Value Date   HGBA1C 5.4 12/27/2022   Lab Results  Component Value Date   VITAMINB12 356 12/22/2019   Lab Results  Component Value Date   TSH 2.200 04/09/2024    02/22/24 TTE 1. Left ventricular ejection fraction, by estimation, is 60 to 65%. The  left ventricle has normal function. The left ventricle has no regional  wall motion abnormalities. There is mild concentric left ventricular  hypertrophy. Left ventricular diastolic  parameters are consistent with Grade I diastolic dysfunction (impaired  relaxation).   2. Right ventricular systolic function is normal. The right ventricular  size is normal. Tricuspid regurgitation signal is inadequate for assessing  PA pressure.   3. Left atrial size was mild to moderately dilated.   4. The mitral valve is  degenerative. Mild mitral valve regurgitation. No  evidence of mitral stenosis. Moderate mitral annular calcification.   5. The aortic valve is calcified. Aortic valve regurgitation is mild.  Moderate aortic valve stenosis. Aortic valve mean gradient measures 24.0  mmHg. Aortic valve Vmax measures 3.32 m/s. Aortic valve acceleration time  measures 83 msec.   Comparison(s): No significant change from prior study. Prior images  reviewed side by side. Prior study was performed during atrial  fibrillation, exaggerating the measured aortic valve stenosis gradients.  Aortic stenosis is definitely present, but is not  severe.   02/22/24 EEG - This study is within normal limits. No seizures or epileptiform discharges were seen throughout the recording.  02/21/24 MRI brain (without) [I reviewed images myself and agree with interpretation. -VRP]  1. 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 2. 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    ASSESSMENT AND PLAN  63 y.o. year old female here with:  Dx:  1. Syncope, unspecified syncope type      PLAN:  NEW ONSET LOSS OF CONSCIOUSNESS (single event on 02/21/24; convulsive syncope vs seizure; in setting of recent labile BP, new atrial fibrillation, recent GI bleeding; would lean towards this being more likely a syncope event, but cannot totally rule out seizure)  - check ambulatory EEG (72 hour) --> rule out seizure event due to witnessed confusion and convulsions  - continue BP monitoring and mgmt per PCP / cardiology  - According to  [place] law, you can not drive unless you are seizure / syncope free for at least 6 months and under physician's care.   - Please maintain precautions. Do not participate in activities where a loss of awareness could harm you or someone else. No swimming alone, no tub bathing, no hot tubs, no driving, no operating motorized vehicles (cars, ATVs, motocycles, etc), lawnmowers, power tools or  firearms. No standing at heights, such as rooftops, ladders or stairs. Avoid hot objects such as stoves, heaters, open fires. Wear a helmet when riding a bicycle, scooter, skateboard, etc. and avoid areas of traffic. Set your water heater to 120 degrees or less.   Orders Placed This Encounter  Procedures   AMBULATORY EEG   Return in about 6 months (around 10/08/2024) for pending test results, MyChart visit (15 min).  I reviewed images, labs, notes, records myself. I summarized findings and reviewed with patient, for this high risk condition (syncope vs seizure) requiring high complexity decision making.    EDUARD FABIENE HANLON, MD 04/10/2024, 9:49 AM Certified in Neurology, Neurophysiology and Neuroimaging  [facility] [address], [address] [place], [place] [address] [phone]

## 2024-04-10 NOTE — Telephone Encounter (Signed)
 72 hr EEG order form completed, signed and faxed to [facility]. Attached pt demo/insurance and last EEG. Confirmation received.

## 2024-04-10 NOTE — Patient Instructions (Signed)
  NEW ONSET LOSS OF CONSCIOUSNESS (single event on 02/21/24; convulsive syncope vs seizure; in setting of recent labile BP, new atrial fibrillation, recent GI bleeding; would lean towards this being more likely a syncope event, but cannot totally rule out seizure)  - check ambulatory EEG (72 hour)  - continue BP monitoring and mgmt per PCP / cardiology  - According to [place] law, you can not drive unless you are seizure / syncope free for at least 6 months and under physician's care.   - Please maintain precautions. Do not participate in activities where a loss of awareness could harm you or someone else. No swimming alone, no tub bathing, no hot tubs, no driving, no operating motorized vehicles (cars, ATVs, motocycles, etc), lawnmowers, power tools or firearms. No standing at heights, such as rooftops, ladders or stairs. Avoid hot objects such as stoves, heaters, open fires. Wear a helmet when riding a bicycle, scooter, skateboard, etc. and avoid areas of traffic. Set your water heater to 120 degrees or less.

## 2024-04-11 ENCOUNTER — Other Ambulatory Visit: Payer: Self-pay | Admitting: Primary Care

## 2024-04-11 ENCOUNTER — Encounter: Payer: Self-pay | Admitting: Diagnostic Neuroimaging

## 2024-04-11 DIAGNOSIS — E785 Hyperlipidemia, unspecified: Secondary | ICD-10-CM

## 2024-04-13 ENCOUNTER — Ambulatory Visit: Payer: Self-pay | Admitting: Cardiology

## 2024-04-13 DIAGNOSIS — I480 Paroxysmal atrial fibrillation: Secondary | ICD-10-CM

## 2024-04-17 ENCOUNTER — Encounter: Payer: Self-pay | Admitting: Primary Care

## 2024-04-17 ENCOUNTER — Ambulatory Visit: Admitting: Primary Care

## 2024-04-17 VITALS — BP 134/62 | HR 63 | Temp 97.7°F | Ht 63.0 in | Wt 160.0 lb

## 2024-04-17 DIAGNOSIS — K5520 Angiodysplasia of colon without hemorrhage: Secondary | ICD-10-CM

## 2024-04-17 DIAGNOSIS — T17308A Unspecified foreign body in larynx causing other injury, initial encounter: Secondary | ICD-10-CM | POA: Insufficient documentation

## 2024-04-17 DIAGNOSIS — Z0001 Encounter for general adult medical examination with abnormal findings: Secondary | ICD-10-CM

## 2024-04-17 DIAGNOSIS — I2510 Atherosclerotic heart disease of native coronary artery without angina pectoris: Secondary | ICD-10-CM

## 2024-04-17 DIAGNOSIS — R1319 Other dysphagia: Secondary | ICD-10-CM | POA: Diagnosis not present

## 2024-04-17 DIAGNOSIS — F419 Anxiety disorder, unspecified: Secondary | ICD-10-CM | POA: Diagnosis not present

## 2024-04-17 DIAGNOSIS — K922 Gastrointestinal hemorrhage, unspecified: Secondary | ICD-10-CM

## 2024-04-17 DIAGNOSIS — T17308S Unspecified foreign body in larynx causing other injury, sequela: Secondary | ICD-10-CM

## 2024-04-17 DIAGNOSIS — E785 Hyperlipidemia, unspecified: Secondary | ICD-10-CM

## 2024-04-17 DIAGNOSIS — Z8719 Personal history of other diseases of the digestive system: Secondary | ICD-10-CM | POA: Diagnosis not present

## 2024-04-17 DIAGNOSIS — Z23 Encounter for immunization: Secondary | ICD-10-CM | POA: Diagnosis not present

## 2024-04-17 DIAGNOSIS — I4892 Unspecified atrial flutter: Secondary | ICD-10-CM

## 2024-04-17 DIAGNOSIS — F32A Depression, unspecified: Secondary | ICD-10-CM

## 2024-04-17 DIAGNOSIS — H539 Unspecified visual disturbance: Secondary | ICD-10-CM

## 2024-04-17 DIAGNOSIS — J449 Chronic obstructive pulmonary disease, unspecified: Secondary | ICD-10-CM

## 2024-04-17 DIAGNOSIS — R569 Unspecified convulsions: Secondary | ICD-10-CM

## 2024-04-17 DIAGNOSIS — I10 Essential (primary) hypertension: Secondary | ICD-10-CM

## 2024-04-17 DIAGNOSIS — I6523 Occlusion and stenosis of bilateral carotid arteries: Secondary | ICD-10-CM

## 2024-04-17 DIAGNOSIS — I350 Nonrheumatic aortic (valve) stenosis: Secondary | ICD-10-CM

## 2024-04-17 MED ORDER — PANTOPRAZOLE SODIUM 40 MG PO TBEC: 40.0000 mg | DELAYED_RELEASE_TABLET | Freq: Every day | ORAL | 0 refills | Status: DC

## 2024-04-17 MED ORDER — BUSPIRONE HCL 10 MG PO TABS: 10.0000 mg | ORAL_TABLET | Freq: Two times a day (BID) | ORAL | 0 refills | Status: DC

## 2024-04-17 NOTE — Assessment & Plan Note (Signed)
 Referral placed to GI

## 2024-04-17 NOTE — Assessment & Plan Note (Signed)
 Improving.  Continue amlodipine  10 mg daily, lisinopril  30 mg daily, metoprolol  tartrate 50 mg twice daily. Reviewed labs from September 2025

## 2024-04-17 NOTE — Patient Instructions (Addendum)
 We increased your dose of buspirone  to 10 mg twice daily for anxiety.  Start pantoprazole  40 mg once daily for heartburn/choking  You will either be contacted via phone regarding your referral to GI, or you may receive a letter on your MyChart portal from our referral team with instructions for scheduling an appointment. Please let us  know if you have not been contacted by anyone within two weeks.  Please reconsider the mammogram.  It was a pleasure to see you today!

## 2024-04-17 NOTE — Assessment & Plan Note (Signed)
 Prevnar 20 influenza vaccines provided today. Pap smear UTD. Mammogram overdue, she continues to decline despite recommendations. Colonoscopy in 2021, unsure when do.  Will refer to GI  Discussed the importance of a healthy diet and regular exercise in order for weight loss, and to reduce the risk of further co-morbidity.  Exam stable. Labs reviewed  Follow up in 1 year for repeat physical.

## 2024-04-17 NOTE — Assessment & Plan Note (Signed)
 Could be secondary to vocal cord/pharyngeal irritation from intubation.  Resume pantoprazole  40 mg daily. Referral placed to GI.

## 2024-04-17 NOTE — Assessment & Plan Note (Signed)
 Following with cardiology, office notes reviewed from September 2025.  Continue blood pressure control, lipid control.

## 2024-04-17 NOTE — Assessment & Plan Note (Signed)
 Following with neurology, office notes reviewed from September 2025.  Move forward with EEG as planned.

## 2024-04-17 NOTE — Assessment & Plan Note (Signed)
 Following with pulmonology, office notes reviewed from September 2025.  Continue he has RPM 2 puffs daily, Advair 250-50 1 puff twice daily.,  Albuterol  inhaler as needed.

## 2024-04-17 NOTE — Assessment & Plan Note (Signed)
 Status post endarterectomy in July 2025. Following with vascular services.  Continue clopidogrel  75 mg daily, atorvastatin  80 mg daily.

## 2024-04-17 NOTE — Assessment & Plan Note (Signed)
 Following with cardiology, office notes reviewed from September 2025. Repeat echo in July 2026 as planned.

## 2024-04-17 NOTE — Assessment & Plan Note (Signed)
 Reviewed lipid panel from May 2025. Continue atorvastatin  80 mg daily.

## 2024-04-17 NOTE — Assessment & Plan Note (Signed)
 Remains uncontrolled largely due to ongoing medical problems and appointments.  Continue Lexapro  20 mg daily. Increase buspirone  to 10 mg twice daily. She will update.

## 2024-04-17 NOTE — Assessment & Plan Note (Signed)
 Following with cardiology.  Office notes reviewed from September 2025.  Continue amiodarone  200 mg daily, metoprolol  titrate 50 mg twice daily.

## 2024-04-17 NOTE — Assessment & Plan Note (Signed)
 Ongoing.  Continue long-term disability given poor eyesight and ongoing medical problems.

## 2024-04-17 NOTE — Progress Notes (Signed)
 Subjective:    Patient ID: Denise [name], female    DOB: Oct 01, 1960, 63 y.o.   MRN: [id number]  Denise [name] is a very pleasant 63 y.o. female who presents today for complete physical and follow up of chronic conditions.  Immunizations: -Tetanus: Completed in 2022 -Influenza: Influenza vaccine provided today.  -Shingles: Completed Shingrix  series -Pneumonia: Never completed   Diet: Fair diet.  Exercise: No regular exercise.  Eye exam: Completes annually  Dental exam: Completes semi-annually    Pap Smear: Completed in 2024 Mammogram: Completed years ago, declines   Colonoscopy: Completed in 2021, unsure when due. Lung Cancer Screening: Completed CT chest in May 2025   She would also like to discuss hoarse voice. Acute since her carotid artery surgery in July 2025. She's also noticed choking when drinking water which occurs several times daily, especially when taking her medications. When talking her voice will go out often. She was intubated during her surgery. She was managed on pantoprazole  40 mg daily but stopped taking about 1 month ago when the prescription ran out.  She continues to experience ongoing anxiety due to her health and multiple medical doctors. She is managed on Lexapro  20 mg daily and buspirone  5 mg BID.   BP Readings from Last 3 Encounters:  04/17/24 134/62  04/10/24 (!) 148/76  04/09/24 (!) 148/78      Review of Systems  Constitutional:  Negative for unexpected weight change.  HENT:  Positive for trouble swallowing and voice change. Negative for rhinorrhea.   Respiratory:  Positive for shortness of breath. Negative for cough.   Cardiovascular:  Positive for chest pain.  Gastrointestinal:  Negative for constipation and diarrhea.  Genitourinary:  Negative for difficulty urinating.  Musculoskeletal:  Negative for arthralgias and myalgias.  Skin:  Negative for rash.  Allergic/Immunologic: Negative for environmental allergies.   Neurological:  Negative for dizziness and headaches.  Psychiatric/Behavioral:  The patient is not nervous/anxious.          Past Medical History:  Diagnosis Date   Allergy    Anemia 12/24/2019   Anxiety    Aortic insufficiency    Aortic stenosis    Arthritis    Carotid artery disease (HCC)    Centrilobular emphysema (HCC) 11/2023   Chickenpox    Cholecystitis with cholelithiasis 03/31/2012   Closed fracture of L4 transverse process s/p traumatic mechanical fall 10/26/2023   COPD (chronic obstructive pulmonary disease) (HCC)    Coronary artery disease    Depression    Diastolic dysfunction    Dyspnea    with exertion   GERD (gastroesophageal reflux disease)    Headache    Heart murmur    High anion gap metabolic acidosis 10/26/2023   History of blood transfusion 2021   HLD (hyperlipidemia)    Hypertension 03/31/2012   Hyponatremia in setting of SIADH    Left lower lobe pulmonary nodule 11/2023   Long-term use of aspirin  therapy    Lumbar compression fracture s/p traumatic mechanical fall; L4 superior endplate    Pneumonia    x 2   PVD (peripheral vascular disease) (HCC)    Shingles    SIADH (syndrome of inappropriate ADH production) (HCC)    Traumatic mechanical fall 10/26/2023   a.) s/p traumatic fall 10/26/2023 reuslting in multiple RIGHT rib (5th - 7th) fractures (required dchest tube), l$ transverse process fracture, L4 superior endplate compression fracture, large volume post-traumatic myofacial/subcutaneous gas collection in chest    Social History   Socioeconomic History  Marital status: Widowed    Spouse name: Not on file   Number of children: 2   Years of education: Not on file   Highest education level: 12th grade  Occupational History   Occupation: Clinical biochemist Rep  Tobacco Use   Smoking status: Former    Current packs/day: 0.00    Average packs/day: 0.8 packs/day for 38.0 years (28.5 ttl pk-yrs)    Types: Cigarettes    Start date:  05/06/1985    Quit date: 05/07/2023    Years since quitting: 0.9   Smokeless tobacco: Never  Vaping Use   Vaping status: Never Used  Substance and Sexual Activity   Alcohol use: Yes    Alcohol/week: 1.0 standard drink of alcohol    Types: 1 Standard drinks or equivalent per week    Comment: occasionally   Drug use: No   Sexual activity: Not Currently    Birth control/protection: Post-menopausal  Other Topics Concern   Not on file  Social History Narrative   Widow.    2 children.   Works in Clinical biochemist. Currently on disability as of 04/2023.   Enjoys reading, cooking.    Social Drivers of Health   Financial Resource Strain: Patient Declined (01/28/2024)   Overall Financial Resource Strain (CARDIA)    Difficulty of Paying Living Expenses: Patient declined  Food Insecurity: No Food Insecurity (02/22/2024)   Hunger Vital Sign    Worried About Running Out of Food in the Last Year: Never true    Ran Out of Food in the Last Year: Never true  Recent Concern: Food Insecurity - Food Insecurity Present (02/19/2024)   Hunger Vital Sign    Worried About Running Out of Food in the Last Year: Sometimes true    Ran Out of Food in the Last Year: Sometimes true  Transportation Needs: No Transportation Needs (02/19/2024)   PRAPARE - Administrator, Civil Service (Medical): No    Lack of Transportation (Non-Medical): No  Recent Concern: Transportation Needs - Unmet Transportation Needs (11/29/2023)   PRAPARE - Transportation    Lack of Transportation (Medical): No    Lack of Transportation (Non-Medical): Yes  Physical Activity: Insufficiently Active (01/28/2024)   Exercise Vital Sign    Days of Exercise per Week: 4 days    Minutes of Exercise per Session: 20 min  Stress: Stress Concern Present (01/28/2024)   [name] of Occupational Health - Occupational Stress Questionnaire    Feeling of Stress: Very much  Social Connections: Unknown (02/19/2024)   Social Connection and  Isolation Panel    Frequency of Communication with Friends and Family: More than three times a week    Frequency of Social Gatherings with Friends and Family: More than three times a week    Attends Religious Services: Patient declined    Database administrator or Organizations: Patient declined    Attends Banker Meetings: Patient declined    Marital Status: Patient declined  Recent Concern: Social Connections - Socially Isolated (01/28/2024)   Social Connection and Isolation Panel    Frequency of Communication with Friends and Family: Three times a week    Frequency of Social Gatherings with Friends and Family: Patient declined    Attends Religious Services: Patient declined    Active Member of Clubs or Organizations: No    Attends Engineer, structural: Not on file    Marital Status: Widowed  Intimate Partner Violence: Not At Risk (02/19/2024)   Humiliation, Afraid, Rape,  and Kick questionnaire    Fear of Current or Ex-Partner: No    Emotionally Abused: No    Physically Abused: No    Sexually Abused: No    Past Surgical History:  Procedure Laterality Date   BIOPSY  12/24/2019   Procedure: BIOPSY;  Surgeon: Teressa Toribio SQUIBB, MD;  Location: [facility] ENDOSCOPY;  Service: Endoscopy;;   BRONCHIAL BIOPSY  12/19/2023   Procedure: BRONCHOSCOPY, WITH BIOPSY;  Surgeon: Isadora Hose, MD;  Location: [facility] ENDOSCOPY;  Service: Pulmonary;;   BRONCHIAL BRUSHINGS  12/19/2023   Procedure: BRONCHOSCOPY, WITH BRUSH BIOPSY;  Surgeon: Isadora Hose, MD;  Location: [facility] ENDOSCOPY;  Service: Pulmonary;;   BRONCHIAL NEEDLE ASPIRATION BIOPSY  12/19/2023   Procedure: BRONCHOSCOPY, WITH NEEDLE ASPIRATION BIOPSY;  Surgeon: Isadora Hose, MD;  Location: [facility] ENDOSCOPY;  Service: Pulmonary;;   BRONCHIAL WASHINGS  12/19/2023   Procedure: IRRIGATION, BRONCHUS;  Surgeon: Isadora Hose, MD;  Location: [facility] ENDOSCOPY;  Service: Pulmonary;;   BRONCHOSCOPY, WITH BIOPSY USING ELECTROMAGNETIC NAVIGATION  Bilateral 12/19/2023   Procedure: ROBOTIC ASSISTED NAVIGATIONAL BRONCHOSCOPY;  Surgeon: Isadora Hose, MD;  Location: [facility] ENDOSCOPY;  Service: Pulmonary;  Laterality: Bilateral;   CATARACT EXTRACTION Bilateral 2021   CHOLECYSTECTOMY  03/30/2012   Procedure: LAPAROSCOPIC CHOLECYSTECTOMY WITH INTRAOPERATIVE CHOLANGIOGRAM;  Surgeon: Morene ONEIDA Olives, MD;  Location: [facility] ORS;  Service: General;  Laterality: N/A;   COLONOSCOPY WITH PROPOFOL  N/A 12/24/2019   Procedure: COLONOSCOPY WITH PROPOFOL ;  Surgeon: Teressa Toribio SQUIBB, MD;  Location: [facility] ENDOSCOPY;  Service: Endoscopy;  Laterality: N/A;   ESOPHAGOGASTRODUODENOSCOPY (EGD) WITH PROPOFOL  N/A 12/24/2019   Procedure: ESOPHAGOGASTRODUODENOSCOPY (EGD) WITH PROPOFOL ;  Surgeon: Teressa Toribio SQUIBB, MD;  Location: [facility] ENDOSCOPY;  Service: Endoscopy;  Laterality: N/A;   HOT HEMOSTASIS N/A 12/24/2019   Procedure: HOT HEMOSTASIS (ARGON PLASMA COAGULATION/BICAP);  Surgeon: Teressa Toribio SQUIBB, MD;  Location: [facility] ENDOSCOPY;  Service: Endoscopy;  Laterality: N/A;   POLYPECTOMY  12/24/2019   Procedure: POLYPECTOMY;  Surgeon: Teressa Toribio SQUIBB, MD;  Location: [facility] ENDOSCOPY;  Service: Endoscopy;;   TRANSCAROTID ARTERY REVASCULARIZATION  Left 02/12/2024   Procedure: TRANSCAROTID ARTERY REVASCULARIZATION (TCAR);  Surgeon: Gretta Lonni PARAS, MD;  Location: [facility] OR;  Service: Vascular;  Laterality: Left;   TUBAL LIGATION  1997   VIDEO BRONCHOSCOPY WITH ENDOBRONCHIAL ULTRASOUND  12/19/2023   Procedure: BRONCHOSCOPY, WITH EBUS;  Surgeon: Isadora Hose, MD;  Location: [facility] ENDOSCOPY;  Service: Pulmonary;;   VIDEO BRONCHOSCOPY WITH RADIAL ENDOBRONCHIAL ULTRASOUND  12/19/2023   Procedure: VIDEO BRONCHOSCOPY WITH RADIAL ENDOBRONCHIAL ULTRASOUND;  Surgeon: Isadora Hose, MD;  Location: [facility] ENDOSCOPY;  Service: Pulmonary;;    Family History  Problem Relation Age of Onset   Arthritis Mother    Cervical cancer Mother    Heart disease Father    Hypertension Father     No Known  Allergies  Current Outpatient Medications on File Prior to Visit  Medication Sig Dispense Refill   acetaminophen  (TYLENOL ) 500 MG tablet Take 2 tablets (1,000 mg total) by mouth every 8 (eight) hours as needed.     albuterol  (VENTOLIN  HFA) 108 (90 Base) MCG/ACT inhaler INHALE 2 PUFFS INTO THE LUNGS EVERY 4-6 HOURS AS NEEDED FOR TIGHTNESS/WHEEZING 8.5 each 0   amiodarone  (PACERONE ) 200 MG tablet Take 1 tablet (200 mg total) by mouth daily. 90 tablet 3   amLODipine  (NORVASC ) 10 MG tablet Take 1 tablet (10 mg total) by mouth daily. 90 tablet 3   Ascorbic Acid  (VITAMIN C ) 1000 MG tablet Take 2,000 mg by mouth daily.     atorvastatin  (LIPITOR ) 80  MG tablet TAKE 1 TABLET (80 MG TOTAL) BY MOUTH DAILY FOR CHOLESTEROL 90 tablet 0   clopidogrel  (PLAVIX ) 75 MG tablet Take 1 tablet (75 mg total) by mouth daily. 30 tablet 6   escitalopram  (LEXAPRO ) 20 MG tablet TAKE 1 TABLET BY MOUTH DAILY FOR ANXIETY 90 tablet 1   fluticasone -salmeterol (WIXELA INHUB) 250-50 MCG/ACT AEPB Inhale 1 puff into the lungs in the morning and at bedtime. 60 each 12   lisinopril  (ZESTRIL ) 30 MG tablet Take 30 mg by mouth daily.     metoprolol  tartrate (LOPRESSOR ) 50 MG tablet Take 1 tablet (50 mg total) by mouth 2 (two) times daily. 180 tablet 3   pyridoxine  (B-6) 100 MG tablet Take 100 mg by mouth daily.     sodium chloride  1 g tablet TAKE 1 TABLET (1 G TOTAL) BY MOUTH 2 (TWO) TIMES DAILY WITH A MEAL. TO INCREASE SODIUM 180 tablet 0   Tiotropium Bromide Monohydrate  (SPIRIVA  RESPIMAT) 2.5 MCG/ACT AERS Inhale 2 puffs into the lungs daily. 60 each 12   vitamin B-12 (CYANOCOBALAMIN ) 1000 MCG tablet Take 1,000 mcg by mouth daily.     vitamin E  180 MG (400 UNITS) capsule Take 400 Units by mouth daily.     [Paused] ferrous sulfate  325 (65 FE) MG tablet Take 650 mg by mouth daily with breakfast. (Patient not taking: Reported on 04/17/2024)     No current facility-administered medications on file prior to visit.    BP 134/62   Pulse 63    Temp 97.7 F (36.5 C) (Temporal)   Ht 5' 3 (1.6 m)   Wt 160 lb (72.6 kg)   SpO2 96%   BMI 28.34 kg/m  Objective:   Physical Exam HENT:     Head:     Comments: Voice hoarseness     Right Ear: Tympanic membrane and ear canal normal.     Left Ear: Tympanic membrane and ear canal normal.  Eyes:     Pupils: Pupils are equal, round, and reactive to light.  Cardiovascular:     Rate and Rhythm: Normal rate and regular rhythm.  Pulmonary:     Effort: Pulmonary effort is normal.     Breath sounds: Normal breath sounds.  Abdominal:     General: Bowel sounds are normal.     Palpations: Abdomen is soft.     Tenderness: There is no abdominal tenderness.  Musculoskeletal:        General: Normal range of motion.     Cervical back: Neck supple.  Skin:    General: Skin is warm and dry.  Neurological:     Mental Status: She is alert and oriented to person, place, and time.     Cranial Nerves: No cranial nerve deficit.     Deep Tendon Reflexes:     Reflex Scores:      Patellar reflexes are 2+ on the right side and 2+ on the left side. Psychiatric:        Mood and Affect: Mood normal.     Physical Exam        Assessment & Plan:  Choking, sequela Assessment & Plan: Could be secondary to vocal cord/pharyngeal irritation from intubation.  Resume pantoprazole  40 mg daily. Referral placed to GI.  Orders: -     Ambulatory referral to Gastroenterology  Gastrointestinal hemorrhage, unspecified gastrointestinal hemorrhage type -     Pantoprazole  Sodium; Take 1 tablet (40 mg total) by mouth daily. For heartburn  Dispense: 90 tablet; Refill: 0 -  Ambulatory referral to Gastroenterology  Anxiety and depression Assessment & Plan: Remains uncontrolled largely due to ongoing medical problems and appointments.  Continue Lexapro  20 mg daily. Increase buspirone  to 10 mg twice daily. She will update.  Orders: -     busPIRone  HCl; Take 1 tablet (10 mg total) by mouth 2 (two) times  daily. For anxiety  Dispense: 180 tablet; Refill: 0  History of rectal bleeding -     Ambulatory referral to Gastroenterology  Essential hypertension Assessment & Plan: Improving.  Continue amlodipine  10 mg daily, lisinopril  30 mg daily, metoprolol  tartrate 50 mg twice daily. Reviewed labs from September 2025   Bilateral carotid artery stenosis Assessment & Plan: Status post endarterectomy in July 2025. Following with vascular services.  Continue clopidogrel  75 mg daily, atorvastatin  80 mg daily.   Coronary artery disease involving native coronary artery of native heart without angina pectoris Assessment & Plan: Following with cardiology, office notes reviewed from September 2025.  Continue blood pressure control, lipid control.   AVM (arteriovenous malformation) of colon Assessment & Plan: Referral placed to GI.   Encounter for immunization -     Flu vaccine trivalent PF, 6mos and older(Flulaval,Afluria,Fluarix,Fluzone) -     Pneumococcal conjugate vaccine 20-valent  Atrial flutter with rapid ventricular response (HCC) Assessment & Plan: Following with cardiology.  Office notes reviewed from September 2025.  Continue amiodarone  200 mg daily, metoprolol  titrate 50 mg twice daily.   Aortic valve stenosis, etiology of cardiac valve disease unspecified Assessment & Plan: Following with cardiology, office notes reviewed from September 2025. Repeat echo in July 2026 as planned.   Chronic obstructive pulmonary disease, unspecified COPD type [facility]) Assessment & Plan: Following with pulmonology, office notes reviewed from September 2025.  Continue he has RPM 2 puffs daily, Advair 250-50 1 puff twice daily.,  Albuterol  inhaler as needed.   Visual disturbance Assessment & Plan: Ongoing.  Continue long-term disability given poor eyesight and ongoing medical problems.   [facility]) Assessment & Plan: Following with neurology, office notes reviewed from September  2025.  Move forward with EEG as planned.   Hyperlipidemia, unspecified hyperlipidemia type Assessment & Plan: Reviewed lipid panel from May 2025. Continue atorvastatin  80 mg daily.   Encounter for annual general medical examination with abnormal findings in adult Assessment & Plan: Prevnar 20 influenza vaccines provided today. Pap smear UTD. Mammogram overdue, she continues to decline despite recommendations. Colonoscopy in 2021, unsure when do.  Will refer to GI  Discussed the importance of a healthy diet and regular exercise in order for weight loss, and to reduce the risk of further co-morbidity.  Exam stable. Labs reviewed  Follow up in 1 year for repeat physical.      Assessment and Plan Assessment & Plan         Comer MARLA Gaskins, NP    History of Present Illness

## 2024-04-19 DIAGNOSIS — R55 Syncope and collapse: Secondary | ICD-10-CM

## 2024-04-19 DIAGNOSIS — R410 Disorientation, unspecified: Secondary | ICD-10-CM

## 2024-04-20 ENCOUNTER — Telehealth: Payer: Self-pay | Admitting: Primary Care

## 2024-04-20 NOTE — Telephone Encounter (Signed)
 Was informed that diagnosis Syncope R55 was not justified with clinical notes for approval of 72 hr EEG.  Advised MD can addend notes to add more to justify Syncope or altered mental status.  Please advise next steps

## 2024-04-20 NOTE — Telephone Encounter (Signed)
 [facility] has returned call to [place], CMA

## 2024-04-20 NOTE — Telephone Encounter (Unsigned)
 Copied from CRM [id number]. Topic: General - Other >> Apr 20, 2024 12:11 PM Wess RAMAN wrote: Reason for CRM:  [employer] would like to know the status of the disability forms and office visit notes being faxed for patient. Advised that papwork may take up to 7-10 business days and it has already passed that timeframe. They would also like to know if Gretta Crank, NP thinks patient can continue working.  Callback #: [phone] ext [phone] Fax #: [phone]

## 2024-04-20 NOTE — Telephone Encounter (Signed)
 Patient was in the hospital and discharged with seizure vs syncope diagnosis. Need 72 hour VEEG to rule out seizure disorder. -VRP

## 2024-04-22 MED ORDER — ASPIRIN 81 MG PO TBEC: 81.0000 mg | DELAYED_RELEASE_TABLET | Freq: Every day | ORAL | Status: DC

## 2024-04-22 MED ORDER — APIXABAN 5 MG PO TABS: 5.0000 mg | ORAL_TABLET | Freq: Two times a day (BID) | ORAL | 6 refills | Status: AC

## 2024-04-24 ENCOUNTER — Telehealth: Payer: Self-pay | Admitting: *Deleted

## 2024-04-24 NOTE — Telephone Encounter (Signed)
 Form scanned into chart 03/30/24 Patient had f/u 04/08/24. Please have form completed.

## 2024-04-25 NOTE — Telephone Encounter (Signed)
 Forms completed and placed in Kelli's inbox.

## 2024-04-26 ENCOUNTER — Other Ambulatory Visit: Payer: Self-pay | Admitting: Primary Care

## 2024-04-26 DIAGNOSIS — I10 Essential (primary) hypertension: Secondary | ICD-10-CM

## 2024-04-27 ENCOUNTER — Encounter (INDEPENDENT_AMBULATORY_CARE_PROVIDER_SITE_OTHER): Payer: Self-pay | Admitting: Neurology

## 2024-04-27 DIAGNOSIS — R55 Syncope and collapse: Secondary | ICD-10-CM

## 2024-04-27 NOTE — Procedures (Signed)
 Patient Name: Denise [name]  MRN: [id number]  Study start date: 04/17/2024 at 718 AM Study end date: 04/19/2024 at 903 AM Duration: 50 hours    Clinical History:  63 year old female here for evaluation of syncope versus seizure. History of bilateral carotid disease, hypertension, hyperlipidemia, peripheral vascular disease, aortic stenosis, paroxysmal atrial fibrillation. 02/12/2024 ?atient underwent left carotid stenting. Postoperative day 1 she developed atrial fibrillation and was started on Eliquis . She was discharged home. She returned on 02/16/2024 due to blood in stool. Eliquis  was stopped and she was discharged home. 02/17/2024 patient returned to the hospital due to atrial flutter and hypertension. Medications were adjusted. She was discharged home. 02/21/2024 patient was noting increased blood pressure at home took some extra medications and then passed out. No tongue biting or incontinence, There was some confusion for several minutes afterwards, She had some tinnitus and prodromal lightheadedness. He was evaluated for convulsive syncope versus seizure. Since the time patient is doing well. No further events.   INTERMITTENT MONITORING with VIDEO TECHNICAL SUMMARY:  This AVEEG was performed using equipment provided by [employer] utilizing Bluetooth ( [employer] ) amplifiers with continuous EEGT attended video collection using encrypted remote transmission via [employer] secured cellular tower network with data rates for each AVEEG performed. This is a Therapist, music AVEEG, obtained, according to the 10-20 international electrode placement system, reformatted digitally into referential and bipolar montages. Data was acquired with a minimum of 21 bipolar connections and sampled at a minimum rate of 250 cycles per second per channel, maximum rate of 450 cycles per second per channel and two channels for EKG. The entire VEEG study was recorded through cable and or radio telemetry for  subsequent analysis. Specified epochs of the AVEEG data were identified at the direction of the subject by the depression of a push button by the patient. Each patients event file included data acquired two minutes prior to the push button activation and continuing until two minutes afterwards. AVEEG files were reviewed on [employer] [facility] server, Licensed Software provided by [employer] with a digital high frequency filter set at 70 Hz and a low frequency filter set at 1 Hz with a paper speed of 39mm/s resulting in 10 seconds per digital page. This entire AVEEG was reviewed by the EEG Technologist. Random time samples, random sleep samples, clips, patient initiated push button files with included patient daily diary logs, EEG Technologist pruned data was reviewed and verified for accuracy and validity by the governing reading neurologist in full details. This AEEGV was fully compliant with all requirements for CPT 97500 for setup, patient education, take down and administered by an EEG technologist.   Long-Term EEG with Video was monitored intermittently by a qualified EEG technologist for the entirety of the recording; quality check-ins were performed at a minimum of every two hours, checking and documenting real-time data and video to assure the integrity and quality of the recording (e.g., camera position, electrode integrity and impedance), and identify the need for maintenance. For intermittent monitoring, an EEG Technologist monitored no more than 12 patients concurrently. Diagnostic video was captured at least 80% of the time during the recording.   PATIENT EVENTS:  There were no patient events noted or captured during this recording.   TECHNOLOGIST EVENTS:  No clear epileptiform activity was detected by the reviewing neurodiagnostic technologist for further review.   TIME SAMPLES:  10-minutes of every 2 hours recorded are reviewed as random time samples  SLEEP SAMPLES:  5-minutes of  every 24 hours recorded are reviewed as random sleep samples.   AWAKE:  At maximal level of alertness, the posterior dominant background activity was continuous, reactive, low voltage rhythm of 9-10 Hz. This was symmetric, well-modulated, and attenuated with eye opening. Diffuse, symmetric, frontocentral beta range activity was present.   SLEEP:  N1 Sleep (Stage 1) was observed and characterized by the disappearance of alpha rhythm and the appearance of vertex activity.   N2 Sleep (Stage 2) was observed and characterized by vertex waves, K-complexes, and sleep spindles.   N3 (Stage 3) sleep was observed and characterized by high amplitude Delta activity of 20%.   REM sleep was observed.   EKG:  There were no arrhythmias or abnormalities noted during this recording. 12 BPM.   Impression:  Normal EEG: awake and asleep   Clinical Correlation:  This is a normal 2-day ambulatory EEG tracing. No focal abnormalities or epileptiform discharges were seen. There were no electrographic seizures noted. No events were captured during the recording. Please note a normal EEG does not exclude the diagnosis of epilepsy.   Gentry Seeber, MD [facility]

## 2024-04-27 NOTE — Telephone Encounter (Signed)
 PPW faxed as requested

## 2024-04-28 ENCOUNTER — Ambulatory Visit: Payer: Self-pay | Admitting: Diagnostic Neuroimaging

## 2024-04-29 NOTE — Telephone Encounter (Signed)
 Forms have been faxed back.  Nothing further needed.

## 2024-05-02 ENCOUNTER — Inpatient Hospital Stay (HOSPITAL_COMMUNITY)
Admission: EM | Admit: 2024-05-02 | Discharge: 2024-05-05 | DRG: 378 | Disposition: A | Discharge status: Home / Self Care | Attending: Internal Medicine | Admitting: Internal Medicine

## 2024-05-02 ENCOUNTER — Emergency Department (HOSPITAL_COMMUNITY)

## 2024-05-02 ENCOUNTER — Encounter (HOSPITAL_COMMUNITY): Payer: Self-pay | Admitting: Pharmacy Technician

## 2024-05-02 ENCOUNTER — Other Ambulatory Visit: Payer: Self-pay

## 2024-05-02 DIAGNOSIS — K31819 Angiodysplasia of stomach and duodenum without bleeding: Secondary | ICD-10-CM | POA: Diagnosis not present

## 2024-05-02 DIAGNOSIS — I110 Hypertensive heart disease with heart failure: Secondary | ICD-10-CM | POA: Diagnosis present

## 2024-05-02 DIAGNOSIS — I480 Paroxysmal atrial fibrillation: Secondary | ICD-10-CM | POA: Diagnosis present

## 2024-05-02 DIAGNOSIS — Z7982 Long term (current) use of aspirin: Secondary | ICD-10-CM

## 2024-05-02 DIAGNOSIS — E871 Hypo-osmolality and hyponatremia: Secondary | ICD-10-CM | POA: Diagnosis present

## 2024-05-02 DIAGNOSIS — R490 Dysphonia: Secondary | ICD-10-CM | POA: Diagnosis present

## 2024-05-02 DIAGNOSIS — K2970 Gastritis, unspecified, without bleeding: Secondary | ICD-10-CM | POA: Diagnosis present

## 2024-05-02 DIAGNOSIS — Z8249 Family history of ischemic heart disease and other diseases of the circulatory system: Secondary | ICD-10-CM | POA: Diagnosis not present

## 2024-05-02 DIAGNOSIS — D125 Benign neoplasm of sigmoid colon: Secondary | ICD-10-CM | POA: Diagnosis present

## 2024-05-02 DIAGNOSIS — I739 Peripheral vascular disease, unspecified: Secondary | ICD-10-CM | POA: Diagnosis present

## 2024-05-02 DIAGNOSIS — E785 Hyperlipidemia, unspecified: Secondary | ICD-10-CM | POA: Diagnosis present

## 2024-05-02 DIAGNOSIS — R911 Solitary pulmonary nodule: Secondary | ICD-10-CM | POA: Diagnosis present

## 2024-05-02 DIAGNOSIS — K921 Melena: Secondary | ICD-10-CM | POA: Diagnosis not present

## 2024-05-02 DIAGNOSIS — I352 Nonrheumatic aortic (valve) stenosis with insufficiency: Secondary | ICD-10-CM | POA: Diagnosis present

## 2024-05-02 DIAGNOSIS — Z79899 Other long term (current) drug therapy: Secondary | ICD-10-CM

## 2024-05-02 DIAGNOSIS — J449 Chronic obstructive pulmonary disease, unspecified: Secondary | ICD-10-CM | POA: Diagnosis present

## 2024-05-02 DIAGNOSIS — J432 Centrilobular emphysema: Secondary | ICD-10-CM | POA: Diagnosis present

## 2024-05-02 DIAGNOSIS — Z9049 Acquired absence of other specified parts of digestive tract: Secondary | ICD-10-CM

## 2024-05-02 DIAGNOSIS — K31811 Angiodysplasia of stomach and duodenum with bleeding: Secondary | ICD-10-CM | POA: Diagnosis present

## 2024-05-02 DIAGNOSIS — F419 Anxiety disorder, unspecified: Secondary | ICD-10-CM | POA: Diagnosis present

## 2024-05-02 DIAGNOSIS — I10 Essential (primary) hypertension: Secondary | ICD-10-CM | POA: Diagnosis present

## 2024-05-02 DIAGNOSIS — I2510 Atherosclerotic heart disease of native coronary artery without angina pectoris: Secondary | ICD-10-CM | POA: Diagnosis present

## 2024-05-02 DIAGNOSIS — K922 Gastrointestinal hemorrhage, unspecified: Principal | ICD-10-CM | POA: Diagnosis present

## 2024-05-02 DIAGNOSIS — D62 Acute posthemorrhagic anemia: Secondary | ICD-10-CM | POA: Diagnosis present

## 2024-05-02 DIAGNOSIS — Z7902 Long term (current) use of antithrombotics/antiplatelets: Secondary | ICD-10-CM | POA: Diagnosis not present

## 2024-05-02 DIAGNOSIS — K5520 Angiodysplasia of colon without hemorrhage: Secondary | ICD-10-CM

## 2024-05-02 DIAGNOSIS — D649 Anemia, unspecified: Secondary | ICD-10-CM | POA: Diagnosis not present

## 2024-05-02 DIAGNOSIS — K3189 Other diseases of stomach and duodenum: Secondary | ICD-10-CM | POA: Diagnosis not present

## 2024-05-02 DIAGNOSIS — K219 Gastro-esophageal reflux disease without esophagitis: Secondary | ICD-10-CM | POA: Diagnosis present

## 2024-05-02 DIAGNOSIS — Z7901 Long term (current) use of anticoagulants: Secondary | ICD-10-CM | POA: Diagnosis not present

## 2024-05-02 DIAGNOSIS — Z7951 Long term (current) use of inhaled steroids: Secondary | ICD-10-CM

## 2024-05-02 DIAGNOSIS — D123 Benign neoplasm of transverse colon: Secondary | ICD-10-CM | POA: Diagnosis present

## 2024-05-02 DIAGNOSIS — I5032 Chronic diastolic (congestive) heart failure: Secondary | ICD-10-CM | POA: Diagnosis present

## 2024-05-02 DIAGNOSIS — D6832 Hemorrhagic disorder due to extrinsic circulating anticoagulants: Secondary | ICD-10-CM | POA: Diagnosis present

## 2024-05-02 DIAGNOSIS — Z87891 Personal history of nicotine dependence: Secondary | ICD-10-CM | POA: Diagnosis not present

## 2024-05-02 DIAGNOSIS — D509 Iron deficiency anemia, unspecified: Secondary | ICD-10-CM

## 2024-05-02 DIAGNOSIS — K648 Other hemorrhoids: Secondary | ICD-10-CM | POA: Diagnosis not present

## 2024-05-02 DIAGNOSIS — K641 Second degree hemorrhoids: Secondary | ICD-10-CM

## 2024-05-02 LAB — CBC WITH DIFFERENTIAL/PLATELET
Abs Immature Granulocytes: 0.05 K/uL (ref 0.00–0.07)
Basophils Absolute: 0 K/uL (ref 0.0–0.1)
Basophils Relative: 0 %
Eosinophils Absolute: 0.1 K/uL (ref 0.0–0.5)
Eosinophils Relative: 1 %
HCT: 26.2 % — ABNORMAL LOW (ref 36.0–46.0)
Hemoglobin: 8 g/dL — ABNORMAL LOW (ref 12.0–15.0)
Immature Granulocytes: 1 %
Lymphocytes Relative: 9 %
Lymphs Abs: 0.8 K/uL (ref 0.7–4.0)
MCH: 28.7 pg (ref 26.0–34.0)
MCHC: 30.5 g/dL (ref 30.0–36.0)
MCV: 93.9 fL (ref 80.0–100.0)
Monocytes Absolute: 0.9 K/uL (ref 0.1–1.0)
Monocytes Relative: 10 %
Neutro Abs: 7.2 K/uL (ref 1.7–7.7)
Neutrophils Relative %: 79 %
Platelets: 461 K/uL — ABNORMAL HIGH (ref 150–400)
RBC: 2.79 MIL/uL — ABNORMAL LOW (ref 3.87–5.11)
RDW: 17.3 % — ABNORMAL HIGH (ref 11.5–15.5)
WBC: 9 K/uL (ref 4.0–10.5)
nRBC: 0.3 % — ABNORMAL HIGH (ref 0.0–0.2)

## 2024-05-02 LAB — PREPARE RBC (CROSSMATCH)

## 2024-05-02 LAB — COMPREHENSIVE METABOLIC PANEL WITH GFR
ALT: 32 U/L (ref 0–44)
AST: 37 U/L (ref 15–41)
Albumin: 3.2 g/dL — ABNORMAL LOW (ref 3.5–5.0)
Alkaline Phosphatase: 82 U/L (ref 38–126)
Anion gap: 10 (ref 5–15)
BUN: 8 mg/dL (ref 8–23)
CO2: 22 mmol/L (ref 22–32)
Calcium: 8.3 mg/dL — ABNORMAL LOW (ref 8.9–10.3)
Chloride: 95 mmol/L — ABNORMAL LOW (ref 98–111)
Creatinine, Ser: 0.69 mg/dL (ref 0.44–1.00)
GFR, Estimated: >60 mL/min (ref >60)
Glucose, Bld: 89 mg/dL (ref 70–99)
Potassium: 3.7 mmol/L (ref 3.5–5.1)
Sodium: 127 mmol/L — ABNORMAL LOW (ref 135–145)
Total Bilirubin: 0.5 mg/dL (ref 0.0–1.2)
Total Protein: 6.4 g/dL — ABNORMAL LOW (ref 6.5–8.1)

## 2024-05-02 LAB — I-STAT CHEM 8, ED
BUN: 7 mg/dL — ABNORMAL LOW (ref 8–23)
Calcium, Ion: 1.06 mmol/L — ABNORMAL LOW (ref 1.15–1.40)
Chloride: 96 mmol/L — ABNORMAL LOW (ref 98–111)
Creatinine, Ser: 0.8 mg/dL (ref 0.44–1.00)
Glucose, Bld: 89 mg/dL (ref 70–99)
HCT: 28 % — ABNORMAL LOW (ref 36.0–46.0)
Hemoglobin: 9.5 g/dL — ABNORMAL LOW (ref 12.0–15.0)
Potassium: 4 mmol/L (ref 3.5–5.1)
Sodium: 129 mmol/L — ABNORMAL LOW (ref 135–145)
TCO2: 23 mmol/L (ref 22–32)

## 2024-05-02 LAB — PROTIME-INR
INR: 1 (ref 0.8–1.2)
Prothrombin Time: 14 s (ref 11.4–15.2)

## 2024-05-02 LAB — FERRITIN: Ferritin: 11 ng/mL (ref 11–307)

## 2024-05-02 LAB — MAGNESIUM: Magnesium: 1.9 mg/dL (ref 1.7–2.4)

## 2024-05-02 LAB — IRON AND TIBC
Iron: <10 ug/dL — ABNORMAL LOW (ref 28–170)
TIBC: 435 ug/dL (ref 250–450)

## 2024-05-02 LAB — HEMOGLOBIN AND HEMATOCRIT, BLOOD
HCT: 25 % — ABNORMAL LOW (ref 36.0–46.0)
Hemoglobin: 8.1 g/dL — ABNORMAL LOW (ref 12.0–15.0)

## 2024-05-02 MED ORDER — IOHEXOL 350 MG/ML SOLN
75.0000 mL | Freq: Once | INTRAVENOUS | Status: AC | PRN
  Administered 2024-05-02: 75 mL via INTRAVENOUS

## 2024-05-02 MED ORDER — ESCITALOPRAM OXALATE 20 MG PO TABS
20.0000 mg | ORAL_TABLET | Freq: Every day | ORAL | Status: DC
  Administered 2024-05-04 – 2024-05-05 (×2): 20 mg via ORAL
  Filled 2024-05-02 (×4): qty 1

## 2024-05-02 MED ORDER — METOPROLOL TARTRATE 50 MG PO TABS
50.0000 mg | ORAL_TABLET | Freq: Two times a day (BID) | ORAL | Status: DC
  Administered 2024-05-02: 50 mg via ORAL
  Filled 2024-05-02: qty 1

## 2024-05-02 MED ORDER — FLUTICASONE FUROATE-VILANTEROL 200-25 MCG/ACT IN AEPB
1.0000 | INHALATION_SPRAY | Freq: Every day | RESPIRATORY_TRACT | Status: DC
  Filled 2024-05-02 (×2): qty 28

## 2024-05-02 MED ORDER — UMECLIDINIUM BROMIDE 62.5 MCG/ACT IN AEPB
1.0000 | INHALATION_SPRAY | Freq: Every day | RESPIRATORY_TRACT | Status: DC
  Filled 2024-05-02 (×2): qty 7

## 2024-05-02 MED ORDER — PANTOPRAZOLE SODIUM 40 MG PO TBEC
40.0000 mg | DELAYED_RELEASE_TABLET | Freq: Every day | ORAL | Status: DC
Start: 2024-05-02 — End: 2024-05-05
  Administered 2024-05-02 – 2024-05-04 (×3): 40 mg via ORAL
  Filled 2024-05-02 (×3): qty 1

## 2024-05-02 MED ORDER — SIMETHICONE 80 MG PO CHEW
160.0000 mg | CHEWABLE_TABLET | Freq: Once | ORAL | Status: AC
  Administered 2024-05-02: 160 mg via ORAL
  Filled 2024-05-02: qty 2

## 2024-05-02 MED ORDER — ALBUTEROL SULFATE (2.5 MG/3ML) 0.083% IN NEBU: 2.5000 mg | INHALATION_SOLUTION | RESPIRATORY_TRACT | Status: DC | PRN

## 2024-05-02 MED ORDER — AMIODARONE HCL 200 MG PO TABS
200.0000 mg | ORAL_TABLET | Freq: Every day | ORAL | Status: DC
Start: 2024-05-03 — End: 2024-05-05
  Administered 2024-05-04 – 2024-05-05 (×2): 200 mg via ORAL
  Filled 2024-05-02 (×4): qty 1

## 2024-05-02 MED ORDER — ACETAMINOPHEN 325 MG PO TABS: 650.0000 mg | ORAL_TABLET | Freq: Four times a day (QID) | ORAL | Status: DC | PRN

## 2024-05-02 MED ORDER — ACETAMINOPHEN 650 MG RE SUPP: 650.0000 mg | Freq: Four times a day (QID) | RECTAL | Status: DC | PRN

## 2024-05-02 MED ORDER — SODIUM CHLORIDE 0.9% IV SOLUTION: Freq: Once | INTRAVENOUS | Status: DC

## 2024-05-02 MED ORDER — BUSPIRONE HCL 5 MG PO TABS
10.0000 mg | ORAL_TABLET | Freq: Two times a day (BID) | ORAL | Status: DC
  Administered 2024-05-02 – 2024-05-05 (×5): 10 mg via ORAL
  Filled 2024-05-02 (×7): qty 2

## 2024-05-02 MED ORDER — ATORVASTATIN CALCIUM 80 MG PO TABS
80.0000 mg | ORAL_TABLET | Freq: Every day | ORAL | Status: DC
  Administered 2024-05-04 – 2024-05-05 (×2): 80 mg via ORAL
  Filled 2024-05-02 (×4): qty 1

## 2024-05-02 NOTE — ED Triage Notes (Signed)
 Pt here POV with reports of dark stool and some BRB in stool for the last several weeks. Currently taking clopidigrel instead of her eliquis  due to the bleeding. Endorses some shob and intermittent chest pain. Recent placement of stent in L carotid.

## 2024-05-02 NOTE — Plan of Care (Signed)
  Problem: Education: Goal: Knowledge of General Education information will improve Description: Including pain rating scale, medication(s)/side effects and non-pharmacologic comfort measures Outcome: Progressing   Problem: Health Behavior/Discharge Planning: Goal: Ability to manage health-related needs will improve Outcome: Progressing   Problem: Pain Managment: Goal: General experience of comfort will improve and/or be controlled Outcome: Progressing   Problem: Safety: Goal: Ability to remain free from injury will improve Outcome: Progressing

## 2024-05-02 NOTE — Consult Note (Signed)
 Consultation  Referring Provider: ER MD/Dixon Primary Care Physician:  Gretta Comer POUR, NP Primary Gastroenterologist:  Dr. Teressa previous/upcoming appointment Dr. Suzann  Reason for Consultation: Rectal bleeding  HPI: Denise [name] is a 63 y.o. female with history of COPD, anxiety, moderate to severe aortic stenosis, coronary artery disease, CHF diastolic, hypertension, history of atrial fibrillation and SIADH. She has been on Plavix  since she underwent left transcarotid artery revascularization on 02/12/2024. She was on triple therapy with Plavix  aspirin  and Eliquis  for period of time just prior to that surgery. She relates that she has had some intermittent dark stools over the past couple of months since her surgery.  She was concerned last week because of dark stool and some increase in shortness of breath, she had called cardiology and was advised to stop the Plavix  and start Eliquis  which she did on Thursday, 04/30/2024.  She says on that day she had a blowout of bleeding with a very large bowel movement of dark red blood in the commode.  She stopped the Eliquis  at that point and then resumed Plavix  the following day yesterday.  She also took her Plavix  today. Yesterday she says she had a large black bowel movement, and this morning had a bowel movement that appeared darker brown/rusty in color.  She has felt more short of breath, and was concerned about anemia and presented to the emergency room.  He has been hemodynamically stable since arrival Labs show hemoglobin of 8/hematocrit 26.2 and MCV of 93.9.  This is down from a baseline hemoglobin of 13.7 on 04/09/2024  Pro time is 14/INR 1.0 Sodium 127/potassium 3.7/BUN 8/creatinine 0.69 LFTs within normal limits  CT angio abdomen and pelvis today shows an enlarged heart with trace pericardial effusion multivessel coronary artery calcifications, COPD with atelectasis or infiltrate right lung base, there is no extravasation to  suggest active hemorrhage.  She is also noted to have extensive atherosclerotic calcification of the common iliac arteries bilaterally with moderate stenosis left and severe stenosis on the right, and interval left L4 endplate compression fracture  He has not had any further bleeding since arrival to the ER.  Patient has have previous history of iron deficiency anemia with hemoglobin as low as 3.8 in 2021 when she underwent evaluation with EGD and colonoscopy Colonoscopy at that time with finding of a 7 mm polyp in the descending colon which was removed, and a single classic appearing medium size AVM in the cecum which was treated with APC, this bled slightly during treatment. EGD was unremarkable.   Past Medical History:  Diagnosis Date   Allergy    Anemia 12/24/2019   Anxiety    Aortic insufficiency    Aortic stenosis    Arthritis    Carotid artery disease    Centrilobular emphysema (HCC) 11/2023   Chickenpox    Cholecystitis with cholelithiasis 03/31/2012   Closed fracture of L4 transverse process s/p traumatic mechanical fall 10/26/2023   COPD (chronic obstructive pulmonary disease) (HCC)    Coronary artery disease    Depression    Diastolic dysfunction    Dyspnea    with exertion   GERD (gastroesophageal reflux disease)    Headache    Heart murmur    High anion gap metabolic acidosis 10/26/2023   History of blood transfusion 2021   HLD (hyperlipidemia)    Hypertension 03/31/2012   Hyponatremia in setting of SIADH    Left lower lobe pulmonary nodule 11/2023   Long-term use of aspirin   therapy    Lumbar compression fracture s/p traumatic mechanical fall; L4 superior endplate    Pneumonia    x 2   PVD (peripheral vascular disease)    Shingles    SIADH (syndrome of inappropriate ADH production)    Traumatic mechanical fall 10/26/2023   a.) s/p traumatic fall 10/26/2023 reuslting in multiple RIGHT rib (5th - 7th) fractures (required dchest tube), l$ transverse process  fracture, L4 superior endplate compression fracture, large volume post-traumatic myofacial/subcutaneous gas collection in chest    Past Surgical History:  Procedure Laterality Date   BIOPSY  12/24/2019   Procedure: BIOPSY;  Surgeon: Teressa Toribio SQUIBB, MD;  Location: [facility] ENDOSCOPY;  Service: Endoscopy;;   BRONCHIAL BIOPSY  12/19/2023   Procedure: BRONCHOSCOPY, WITH BIOPSY;  Surgeon: Isadora Hose, MD;  Location: [facility] ENDOSCOPY;  Service: Pulmonary;;   BRONCHIAL BRUSHINGS  12/19/2023   Procedure: BRONCHOSCOPY, WITH BRUSH BIOPSY;  Surgeon: Isadora Hose, MD;  Location: [facility] ENDOSCOPY;  Service: Pulmonary;;   BRONCHIAL NEEDLE ASPIRATION BIOPSY  12/19/2023   Procedure: BRONCHOSCOPY, WITH NEEDLE ASPIRATION BIOPSY;  Surgeon: Isadora Hose, MD;  Location: [facility] ENDOSCOPY;  Service: Pulmonary;;   BRONCHIAL WASHINGS  12/19/2023   Procedure: IRRIGATION, BRONCHUS;  Surgeon: Isadora Hose, MD;  Location: [facility] ENDOSCOPY;  Service: Pulmonary;;   BRONCHOSCOPY, WITH BIOPSY USING ELECTROMAGNETIC NAVIGATION Bilateral 12/19/2023   Procedure: ROBOTIC ASSISTED NAVIGATIONAL BRONCHOSCOPY;  Surgeon: Isadora Hose, MD;  Location: [facility] ENDOSCOPY;  Service: Pulmonary;  Laterality: Bilateral;   CATARACT EXTRACTION Bilateral 2021   CHOLECYSTECTOMY  03/30/2012   Procedure: LAPAROSCOPIC CHOLECYSTECTOMY WITH INTRAOPERATIVE CHOLANGIOGRAM;  Surgeon: Morene ONEIDA Olives, MD;  Location: [facility] ORS;  Service: General;  Laterality: N/A;   COLONOSCOPY WITH PROPOFOL  N/A 12/24/2019   Procedure: COLONOSCOPY WITH PROPOFOL ;  Surgeon: Teressa Toribio SQUIBB, MD;  Location: [facility] ENDOSCOPY;  Service: Endoscopy;  Laterality: N/A;   ESOPHAGOGASTRODUODENOSCOPY (EGD) WITH PROPOFOL  N/A 12/24/2019   Procedure: ESOPHAGOGASTRODUODENOSCOPY (EGD) WITH PROPOFOL ;  Surgeon: Teressa Toribio SQUIBB, MD;  Location: [facility] ENDOSCOPY;  Service: Endoscopy;  Laterality: N/A;   HOT HEMOSTASIS N/A 12/24/2019   Procedure: HOT HEMOSTASIS (ARGON PLASMA COAGULATION/BICAP);  Surgeon: Teressa Toribio SQUIBB, MD;  Location: [facility] ENDOSCOPY;  Service: Endoscopy;  Laterality: N/A;   POLYPECTOMY  12/24/2019   Procedure: POLYPECTOMY;  Surgeon: Teressa Toribio SQUIBB, MD;  Location: [facility] ENDOSCOPY;  Service: Endoscopy;;   TRANSCAROTID ARTERY REVASCULARIZATION  Left 02/12/2024   Procedure: TRANSCAROTID ARTERY REVASCULARIZATION (TCAR);  Surgeon: Gretta Lonni PARAS, MD;  Location: [facility] OR;  Service: Vascular;  Laterality: Left;   TUBAL LIGATION  1997   VIDEO BRONCHOSCOPY WITH ENDOBRONCHIAL ULTRASOUND  12/19/2023   Procedure: BRONCHOSCOPY, WITH EBUS;  Surgeon: Isadora Hose, MD;  Location: [facility] ENDOSCOPY;  Service: Pulmonary;;   VIDEO BRONCHOSCOPY WITH RADIAL ENDOBRONCHIAL ULTRASOUND  12/19/2023   Procedure: VIDEO BRONCHOSCOPY WITH RADIAL ENDOBRONCHIAL ULTRASOUND;  Surgeon: Isadora Hose, MD;  Location: [facility] ENDOSCOPY;  Service: Pulmonary;;    Prior to Admission medications   Medication Sig Start Date End Date Taking? Authorizing Provider  acetaminophen  (TYLENOL ) 500 MG tablet Take 2 tablets (1,000 mg total) by mouth every 8 (eight) hours as needed. 10/31/23   Maczis, Michael M, PA-C  albuterol  (VENTOLIN  HFA) 108 (90 Base) MCG/ACT inhaler INHALE 2 PUFFS INTO THE LUNGS EVERY 4-6 HOURS AS NEEDED FOR TIGHTNESS/WHEEZING 01/31/24   Gretta Comer POUR, NP  amiodarone  (PACERONE ) 200 MG tablet Take 1 tablet (200 mg total) by mouth daily. 03/19/24   West, Katlyn D, NP  amLODipine  (NORVASC ) 10 MG tablet Take 1 tablet (10 mg  total) by mouth daily. 04/09/24 07/08/24  West, Katlyn D, NP  apixaban  (ELIQUIS ) 5 MG TABS tablet Take 1 tablet (5 mg total) by mouth 2 (two) times daily. 04/22/24   West, Katlyn D, NP  Ascorbic Acid  (VITAMIN C ) 1000 MG tablet Take 2,000 mg by mouth daily.    [provider]  aspirin  EC 81 MG tablet Take 1 tablet (81 mg total) by mouth daily. Swallow whole. 04/22/24   West, Katlyn D, NP  atorvastatin  (LIPITOR ) 80 MG tablet TAKE 1 TABLET (80 MG TOTAL) BY MOUTH DAILY FOR CHOLESTEROL 04/11/24 04/11/25  Clark,  Katherine K, NP  busPIRone  (BUSPAR ) 10 MG tablet Take 1 tablet (10 mg total) by mouth 2 (two) times daily. For anxiety 04/17/24   Gretta Comer POUR, NP  escitalopram  (LEXAPRO ) 20 MG tablet TAKE 1 TABLET BY MOUTH DAILY FOR ANXIETY 12/15/23   Gretta Comer POUR, NP  ferrous sulfate  325 (65 FE) MG tablet Take 650 mg by mouth daily with breakfast. Patient not taking: Reported on 04/17/2024    [provider]  fluticasone -salmeterol (WIXELA INHUB) 250-50 MCG/ACT AEPB Inhale 1 puff into the lungs in the morning and at bedtime. 04/07/24   Isadora Hose, MD  lisinopril  (ZESTRIL ) 30 MG tablet TAKE 1 TABLET (30 MG TOTAL) BY MOUTH DAILY FOR BLOOD PRESSURE. 04/27/24   Clark, Katherine K, NP  metoprolol  tartrate (LOPRESSOR ) 50 MG tablet Take 1 tablet (50 mg total) by mouth 2 (two) times daily. 03/19/24   West, Katlyn D, NP  pantoprazole  (PROTONIX ) 40 MG tablet Take 1 tablet (40 mg total) by mouth daily. For heartburn 04/17/24   Gretta Comer POUR, NP  pyridoxine  (B-6) 100 MG tablet Take 100 mg by mouth daily.    [provider]  sodium chloride  1 g tablet TAKE 1 TABLET (1 G TOTAL) BY MOUTH 2 (TWO) TIMES DAILY WITH A MEAL. TO INCREASE SODIUM 03/16/24   Clark, Katherine K, NP  Tiotropium Bromide Monohydrate  (SPIRIVA  RESPIMAT) 2.5 MCG/ACT AERS Inhale 2 puffs into the lungs daily. 04/07/24   Isadora Hose, MD  vitamin B-12 (CYANOCOBALAMIN ) 1000 MCG tablet Take 1,000 mcg by mouth daily.    [provider]  vitamin E  180 MG (400 UNITS) capsule Take 400 Units by mouth daily.    [provider]    Current Facility-Administered Medications  Medication Dose Route Frequency Provider Last Rate Last Admin   0.9 %  sodium chloride  infusion (Manually program via Guardrails IV Fluids)   Intravenous Once Melvenia Motto, MD       Current Outpatient Medications  Medication Sig Dispense Refill   acetaminophen  (TYLENOL ) 500 MG tablet Take 2 tablets (1,000 mg total) by mouth every 8 (eight) hours as  needed.     albuterol  (VENTOLIN  HFA) 108 (90 Base) MCG/ACT inhaler INHALE 2 PUFFS INTO THE LUNGS EVERY 4-6 HOURS AS NEEDED FOR TIGHTNESS/WHEEZING 8.5 each 0   amiodarone  (PACERONE ) 200 MG tablet Take 1 tablet (200 mg total) by mouth daily. 90 tablet 3   amLODipine  (NORVASC ) 10 MG tablet Take 1 tablet (10 mg total) by mouth daily. 90 tablet 3   apixaban  (ELIQUIS ) 5 MG TABS tablet Take 1 tablet (5 mg total) by mouth 2 (two) times daily. 60 tablet 6   Ascorbic Acid  (VITAMIN C ) 1000 MG tablet Take 2,000 mg by mouth daily.     aspirin  EC 81 MG tablet Take 1 tablet (81 mg total) by mouth daily. Swallow whole.     atorvastatin  (LIPITOR ) 80 MG tablet TAKE 1  TABLET (80 MG TOTAL) BY MOUTH DAILY FOR CHOLESTEROL 90 tablet 0   busPIRone  (BUSPAR ) 10 MG tablet Take 1 tablet (10 mg total) by mouth 2 (two) times daily. For anxiety 180 tablet 0   escitalopram  (LEXAPRO ) 20 MG tablet TAKE 1 TABLET BY MOUTH DAILY FOR ANXIETY 90 tablet 1   [Paused] ferrous sulfate  325 (65 FE) MG tablet Take 650 mg by mouth daily with breakfast. (Patient not taking: Reported on 04/17/2024)     fluticasone -salmeterol (WIXELA INHUB) 250-50 MCG/ACT AEPB Inhale 1 puff into the lungs in the morning and at bedtime. 60 each 12   lisinopril  (ZESTRIL ) 30 MG tablet TAKE 1 TABLET (30 MG TOTAL) BY MOUTH DAILY FOR BLOOD PRESSURE. 90 tablet 2   metoprolol  tartrate (LOPRESSOR ) 50 MG tablet Take 1 tablet (50 mg total) by mouth 2 (two) times daily. 180 tablet 3   pantoprazole  (PROTONIX ) 40 MG tablet Take 1 tablet (40 mg total) by mouth daily. For heartburn 90 tablet 0   pyridoxine  (B-6) 100 MG tablet Take 100 mg by mouth daily.     sodium chloride  1 g tablet TAKE 1 TABLET (1 G TOTAL) BY MOUTH 2 (TWO) TIMES DAILY WITH A MEAL. TO INCREASE SODIUM 180 tablet 0   Tiotropium Bromide Monohydrate  (SPIRIVA  RESPIMAT) 2.5 MCG/ACT AERS Inhale 2 puffs into the lungs daily. 60 each 12   vitamin B-12 (CYANOCOBALAMIN ) 1000 MCG tablet Take 1,000 mcg by mouth daily.      vitamin E  180 MG (400 UNITS) capsule Take 400 Units by mouth daily.      Allergies as of 05/02/2024   (No Known Allergies)    Family History  Problem Relation Age of Onset   Arthritis Mother    Cervical cancer Mother    Heart disease Father    Hypertension Father     Social History   Socioeconomic History   Marital status: Widowed    Spouse name: Not on file   Number of children: 2   Years of education: Not on file   Highest education level: 12th grade  Occupational History   Occupation: Clinical biochemist Rep  Tobacco Use   Smoking status: Former    Current packs/day: 0.00    Average packs/day: 0.8 packs/day for 38.0 years (28.5 ttl pk-yrs)    Types: Cigarettes    Start date: 05/06/1985    Quit date: 05/07/2023    Years since quitting: 0.9   Smokeless tobacco: Never  Vaping Use   Vaping status: Never Used  Substance and Sexual Activity   Alcohol use: Yes    Alcohol/week: 1.0 standard drink of alcohol    Types: 1 Standard drinks or equivalent per week    Comment: occasionally   Drug use: No   Sexual activity: Not Currently    Birth control/protection: Post-menopausal  Other Topics Concern   Not on file  Social History Narrative   Widow.    2 children.   Works in Clinical biochemist. Currently on disability as of 04/2023.   Enjoys reading, cooking.    Social Drivers of Health   Financial Resource Strain: Patient Declined (01/28/2024)   Overall Financial Resource Strain (CARDIA)    Difficulty of Paying Living Expenses: Patient declined  Food Insecurity: No Food Insecurity (02/22/2024)   Hunger Vital Sign    Worried About Running Out of Food in the Last Year: Never true    Ran Out of Food in the Last Year: Never true  Recent Concern: Food Insecurity - Food Insecurity Present (02/19/2024)  Hunger Vital Sign    Worried About Running Out of Food in the Last Year: Sometimes true    Ran Out of Food in the Last Year: Sometimes true  Transportation Needs: No  Transportation Needs (02/19/2024)   PRAPARE - Administrator, Civil Service (Medical): No    Lack of Transportation (Non-Medical): No  Recent Concern: Transportation Needs - Unmet Transportation Needs (11/29/2023)   PRAPARE - Transportation    Lack of Transportation (Medical): No    Lack of Transportation (Non-Medical): Yes  Physical Activity: Insufficiently Active (01/28/2024)   Exercise Vital Sign    Days of Exercise per Week: 4 days    Minutes of Exercise per Session: 20 min  Stress: Stress Concern Present (01/28/2024)   [name] of Occupational Health - Occupational Stress Questionnaire    Feeling of Stress: Very much  Social Connections: Unknown (02/19/2024)   Social Connection and Isolation Panel    Frequency of Communication with Friends and Family: More than three times a week    Frequency of Social Gatherings with Friends and Family: More than three times a week    Attends Religious Services: Patient declined    Database administrator or Organizations: Patient declined    Attends Banker Meetings: Patient declined    Marital Status: Patient declined  Recent Concern: Social Connections - Socially Isolated (01/28/2024)   Social Connection and Isolation Panel    Frequency of Communication with Friends and Family: Three times a week    Frequency of Social Gatherings with Friends and Family: Patient declined    Attends Religious Services: Patient declined    Active Member of Clubs or Organizations: No    Attends Engineer, structural: Not on file    Marital Status: Widowed  Intimate Partner Violence: Not At Risk (02/19/2024)   Humiliation, Afraid, Rape, and Kick questionnaire    Fear of Current or Ex-Partner: No    Emotionally Abused: No    Physically Abused: No    Sexually Abused: No    Review of Systems: Pertinent positive and negative review of systems were noted in the above HPI section.  All other review of systems was otherwise  negative.   Physical Exam: Vital signs in last 24 hours: Temp:  [98.6 F (37 C)-99.1 F (37.3 C)] 99.1 F (37.3 C) (09/27 1422) Pulse Rate:  [68-77] 77 (09/27 1422) Resp:  [18-19] 18 (09/27 1422) BP: (130-145)/(65-98) 145/65 (09/27 1422) SpO2:  [95 %-98 %] 95 % (09/27 1422)   General:   Alert,  Well-developed, well-nourished, older white female pleasant and cooperative in NAD, on O2 2 L Head:  Normocephalic and atraumatic. Eyes:  Sclera clear, no icterus.   Conjunctiva pink. Ears:  Normal auditory acuity. Nose:  No deformity, discharge,  or lesions. Mouth:  No deformity or lesions.   Neck:  Supple; no masses or thyromegaly. Lungs: Somewhat decreased breath sounds bilaterally, scattered wheezes, some mild increased work of breathing  heart:  Regular rate and rhythm; no murmurs, clicks, rubs,  or gallops. Abdomen:  Soft,nontender, BS active,nonpalp mass or hsm.   Rectal: Not done,  Msk:  Symmetrical without gross deformities. . Pulses:  Normal pulses noted. Extremities:  Without clubbing or edema. Neurologic:  Alert and  oriented x4;  grossly normal neurologically. Skin:  Intact without significant lesions or rashes.. Psych:  Alert and cooperative. Normal mood and affect.  Intake/Output from previous day: No intake/output data recorded. Intake/Output this shift: No intake/output data recorded.  Lab Results: Recent Labs    05/02/24 1126 05/02/24 1139  WBC 9.0  --   HGB 8.0* 9.5*  HCT 26.2* 28.0*  PLT 461*  --    BMET Recent Labs    05/02/24 1126 05/02/24 1139  NA 127* 129*  K 3.7 4.0  CL 95* 96*  CO2 22  --   GLUCOSE 89 89  BUN 8 7*  CREATININE 0.69 0.80  CALCIUM  8.3*  --    LFT Recent Labs    05/02/24 1126  PROT 6.4*  ALBUMIN  3.2*  AST 37  ALT 32  ALKPHOS 82  BILITOT 0.5   PT/INR Recent Labs    05/02/24 1126  LABPROT 14.0  INR 1.0      IMPRESSION:  #53 63 year old white female presenting with dark stools over the past couple of months  perhaps more so in the past couple of weeks, and then an episode of overt bleeding with a large dark red bowel movement on Thursday, 04/30/2024, then a melenic stool yesterday.  This is in the setting of chronic Plavix , and 1 dose of Eliquis  on 04/30/2024.  Patient has had an almost 6 g drop in hemoglobin over the past 3 weeks.  She has previously documented cecal AVM which was treated with APC at colonoscopy in 2021.  She certainly may have recurrent colonic and/or other small bowel or gastric AVMs.  Suspect that she has had a subacute and acute GI bleed in setting of Plavix  likely secondary to AVM's  #2 anemia acute/normocytic secondary to acute GI blood loss #3 previous history of iron deficiency anemia, will reevaluate #4 exertional dyspnea in setting of chronic COPD and acute GI blood loss  #5 history of atrial fibrillation/operative #6 status post left transcarotid artery revascularization on 02/12/2024 #7 chronic anxiety  Plan; okay for regular diet ER MD has ordered transfusion x 2. Hold Plavix  Patient will need EGD and colonoscopy this admission after Plavix  washout for 3 days. Will check iron studies Serial hemoglobins and transfuse as indicated to keep her hemoglobin closer to 8 Patient will also need cardiology consultation to help with decisions regarding continuing/resuming Plavix  versus converting to Eliquis . GI will follow with you Would anticipate bowel prep Monday and procedures Tuesday  Ephrem Carrick PA-C 05/02/2024, 3:54 PM

## 2024-05-02 NOTE — H&P (Addendum)
 History and Physical    Denise [name] FMW:[id number] DOB: 11/15/1960 DOA: 05/02/2024  DOS: the patient was seen and examined on 05/02/2024  PCP: Gretta Comer POUR, NP   Patient coming from: Home  I have personally briefly reviewed patient's old medical records in [facility] and CareEverywhere  HPI:   Denise [name] is a 63 y.o. year old female with past medical history of GI bleed 2/2 to to AVM, HTN, HLD c/b left carotid stenosis s/p stent placement in 02/2024, COPD, Afib presenting today for blood in her stools.  She states that has been going on for several weeks and it appears her PCP referred her to GI earlier this month.  But she has developed shortness of breath, worsening fatigue and generalized weakness prompting her to come to the ED.  ED Course: Patient arrived to the ED and was hemodynamically stable.  CBC showed hemoglobin at 8 from baseline of 13 which was last checked 3 weeks ago.  She received abdominal imaging including a GI bleed study that was negative for any acute bleeding.  INR was normal.  CMP showed hyponatremia, low protein and albumin  and mild hypocalcemia.  GI was consulted by the EDP and [facility] was consulted for admission.  [facility] contacted for admission.  Review of Systems: As mentioned in the history of present illness. All other systems reviewed and are negative.    Past Medical History:  Diagnosis Date   Allergy    Anemia 12/24/2019   Anxiety    Aortic insufficiency    Aortic stenosis    Arthritis    Carotid artery disease    Centrilobular emphysema (HCC) 11/2023   Chickenpox    Cholecystitis with cholelithiasis 03/31/2012   Closed fracture of L4 transverse process s/p traumatic mechanical fall 10/26/2023   COPD (chronic obstructive pulmonary disease) (HCC)    Coronary artery disease    Depression    Diastolic dysfunction    Dyspnea    with exertion   GERD (gastroesophageal reflux disease)    Headache    Heart murmur    High  anion gap metabolic acidosis 10/26/2023   History of blood transfusion 2021   HLD (hyperlipidemia)    Hypertension 03/31/2012   Hyponatremia in setting of SIADH    Left lower lobe pulmonary nodule 11/2023   Long-term use of aspirin  therapy    Lumbar compression fracture s/p traumatic mechanical fall; L4 superior endplate    Pneumonia    x 2   PVD (peripheral vascular disease)    Shingles    SIADH (syndrome of inappropriate ADH production)    Traumatic mechanical fall 10/26/2023   a.) s/p traumatic fall 10/26/2023 reuslting in multiple RIGHT rib (5th - 7th) fractures (required dchest tube), l$ transverse process fracture, L4 superior endplate compression fracture, large volume post-traumatic myofacial/subcutaneous gas collection in chest    Past Surgical History:  Procedure Laterality Date   BIOPSY  12/24/2019   Procedure: BIOPSY;  Surgeon: Teressa Toribio SQUIBB, MD;  Location: [facility] ENDOSCOPY;  Service: Endoscopy;;   BRONCHIAL BIOPSY  12/19/2023   Procedure: BRONCHOSCOPY, WITH BIOPSY;  Surgeon: Isadora Hose, MD;  Location: [facility] ENDOSCOPY;  Service: Pulmonary;;   BRONCHIAL BRUSHINGS  12/19/2023   Procedure: BRONCHOSCOPY, WITH BRUSH BIOPSY;  Surgeon: Isadora Hose, MD;  Location: [facility] ENDOSCOPY;  Service: Pulmonary;;   BRONCHIAL NEEDLE ASPIRATION BIOPSY  12/19/2023   Procedure: BRONCHOSCOPY, WITH NEEDLE ASPIRATION BIOPSY;  Surgeon: Isadora Hose, MD;  Location: [facility] ENDOSCOPY;  Service: Pulmonary;;   BRONCHIAL  WASHINGS  12/19/2023   Procedure: IRRIGATION, BRONCHUS;  Surgeon: Isadora Hose, MD;  Location: [facility] ENDOSCOPY;  Service: Pulmonary;;   BRONCHOSCOPY, WITH BIOPSY USING ELECTROMAGNETIC NAVIGATION Bilateral 12/19/2023   Procedure: ROBOTIC ASSISTED NAVIGATIONAL BRONCHOSCOPY;  Surgeon: Isadora Hose, MD;  Location: [facility] ENDOSCOPY;  Service: Pulmonary;  Laterality: Bilateral;   CATARACT EXTRACTION Bilateral 2021   CHOLECYSTECTOMY  03/30/2012   Procedure: LAPAROSCOPIC CHOLECYSTECTOMY WITH  INTRAOPERATIVE CHOLANGIOGRAM;  Surgeon: Morene ONEIDA Olives, MD;  Location: [facility] ORS;  Service: General;  Laterality: N/A;   COLONOSCOPY WITH PROPOFOL  N/A 12/24/2019   Procedure: COLONOSCOPY WITH PROPOFOL ;  Surgeon: Teressa Toribio SQUIBB, MD;  Location: [facility] ENDOSCOPY;  Service: Endoscopy;  Laterality: N/A;   ESOPHAGOGASTRODUODENOSCOPY (EGD) WITH PROPOFOL  N/A 12/24/2019   Procedure: ESOPHAGOGASTRODUODENOSCOPY (EGD) WITH PROPOFOL ;  Surgeon: Teressa Toribio SQUIBB, MD;  Location: [facility] ENDOSCOPY;  Service: Endoscopy;  Laterality: N/A;   HOT HEMOSTASIS N/A 12/24/2019   Procedure: HOT HEMOSTASIS (ARGON PLASMA COAGULATION/BICAP);  Surgeon: Teressa Toribio SQUIBB, MD;  Location: [facility] ENDOSCOPY;  Service: Endoscopy;  Laterality: N/A;   POLYPECTOMY  12/24/2019   Procedure: POLYPECTOMY;  Surgeon: Teressa Toribio SQUIBB, MD;  Location: [facility] ENDOSCOPY;  Service: Endoscopy;;   TRANSCAROTID ARTERY REVASCULARIZATION  Left 02/12/2024   Procedure: TRANSCAROTID ARTERY REVASCULARIZATION (TCAR);  Surgeon: Gretta Lonni PARAS, MD;  Location: [facility] OR;  Service: Vascular;  Laterality: Left;   TUBAL LIGATION  1997   VIDEO BRONCHOSCOPY WITH ENDOBRONCHIAL ULTRASOUND  12/19/2023   Procedure: BRONCHOSCOPY, WITH EBUS;  Surgeon: Isadora Hose, MD;  Location: [facility] ENDOSCOPY;  Service: Pulmonary;;   VIDEO BRONCHOSCOPY WITH RADIAL ENDOBRONCHIAL ULTRASOUND  12/19/2023   Procedure: VIDEO BRONCHOSCOPY WITH RADIAL ENDOBRONCHIAL ULTRASOUND;  Surgeon: Isadora Hose, MD;  Location: [facility] ENDOSCOPY;  Service: Pulmonary;;     reports that she quit smoking about a year ago. Her smoking use included cigarettes. She started smoking about 39 years ago. She has a 28.5 pack-year smoking history. She has never used smokeless tobacco. She reports current alcohol use of about 1.0 standard drink of alcohol per week. She reports that she does not use drugs.  No Known Allergies  Family History  Problem Relation Age of Onset   Arthritis Mother    Cervical cancer Mother    Heart  disease Father    Hypertension Father     Prior to Admission medications   Medication Sig Start Date End Date Taking? Authorizing Provider  acetaminophen  (TYLENOL ) 500 MG tablet Take 2 tablets (1,000 mg total) by mouth every 8 (eight) hours as needed. 10/31/23   Maczis, Michael M, PA-C  albuterol  (VENTOLIN  HFA) 108 (90 Base) MCG/ACT inhaler INHALE 2 PUFFS INTO THE LUNGS EVERY 4-6 HOURS AS NEEDED FOR TIGHTNESS/WHEEZING 01/31/24   Gretta Comer POUR, NP  amiodarone  (PACERONE ) 200 MG tablet Take 1 tablet (200 mg total) by mouth daily. 03/19/24   West, Katlyn D, NP  amLODipine  (NORVASC ) 10 MG tablet Take 1 tablet (10 mg total) by mouth daily. 04/09/24 07/08/24  West, Katlyn D, NP  apixaban  (ELIQUIS ) 5 MG TABS tablet Take 1 tablet (5 mg total) by mouth 2 (two) times daily. 04/22/24   West, Katlyn D, NP  Ascorbic Acid  (VITAMIN C ) 1000 MG tablet Take 2,000 mg by mouth daily.    [provider]  aspirin  EC 81 MG tablet Take 1 tablet (81 mg total) by mouth daily. Swallow whole. 04/22/24   West, Katlyn D, NP  atorvastatin  (LIPITOR ) 80 MG tablet TAKE 1 TABLET (80 MG TOTAL) BY MOUTH DAILY FOR CHOLESTEROL 04/11/24 04/11/25  Gretta,  Katherine K, NP  busPIRone  (BUSPAR ) 10 MG tablet Take 1 tablet (10 mg total) by mouth 2 (two) times daily. For anxiety 04/17/24   Gretta Comer POUR, NP  escitalopram  (LEXAPRO ) 20 MG tablet TAKE 1 TABLET BY MOUTH DAILY FOR ANXIETY 12/15/23   Gretta Comer POUR, NP  ferrous sulfate  325 (65 FE) MG tablet Take 650 mg by mouth daily with breakfast. Patient not taking: Reported on 04/17/2024    [provider]  fluticasone -salmeterol (WIXELA INHUB) 250-50 MCG/ACT AEPB Inhale 1 puff into the lungs in the morning and at bedtime. 04/07/24   Isadora Hose, MD  lisinopril  (ZESTRIL ) 30 MG tablet TAKE 1 TABLET (30 MG TOTAL) BY MOUTH DAILY FOR BLOOD PRESSURE. 04/27/24   Clark, Katherine K, NP  metoprolol  tartrate (LOPRESSOR ) 50 MG tablet Take 1 tablet (50 mg total) by mouth 2 (two) times daily.  03/19/24   West, Katlyn D, NP  pantoprazole  (PROTONIX ) 40 MG tablet Take 1 tablet (40 mg total) by mouth daily. For heartburn 04/17/24   Gretta Comer POUR, NP  pyridoxine  (B-6) 100 MG tablet Take 100 mg by mouth daily.    [provider]  sodium chloride  1 g tablet TAKE 1 TABLET (1 G TOTAL) BY MOUTH 2 (TWO) TIMES DAILY WITH A MEAL. TO INCREASE SODIUM 03/16/24   Clark, Katherine K, NP  Tiotropium Bromide Monohydrate  (SPIRIVA  RESPIMAT) 2.5 MCG/ACT AERS Inhale 2 puffs into the lungs daily. 04/07/24   Isadora Hose, MD  vitamin B-12 (CYANOCOBALAMIN ) 1000 MCG tablet Take 1,000 mcg by mouth daily.    [provider]  vitamin E  180 MG (400 UNITS) capsule Take 400 Units by mouth daily.    [provider]    Physical Exam: Vitals:   05/02/24 1422 05/02/24 1621 05/02/24 1725 05/02/24 2046  BP: (!) 145/65  (!) 119/58 (!) 108/45  Pulse: 77  70 78  Resp: 18  18 18   Temp: 99.1 F (37.3 C)  98.8 F (37.1 C) 98.4 F (36.9 C)  TempSrc: Oral  Oral Oral  SpO2: 95%  93% 99%  Height:  5' 3 (1.6 m)      Physical Exam: Gen: pleasant female in NAD HENT: NCAT CV: regular rate and rhythm Resp: CTAB Abd: No TTP, normal bowel sounds MSK: no asymmetry Neuro: alert and oriented x4 Psych: normal mood    Labs on Admission: I have personally reviewed following labs and imaging studies  CBC: Recent Labs  Lab 05/02/24 1126 05/02/24 1139 05/02/24 1908  WBC 9.0  --   --   NEUTROABS 7.2  --   --   HGB 8.0* 9.5* 8.1*  HCT 26.2* 28.0* 25.0*  MCV 93.9  --   --   PLT 461*  --   --    Basic Metabolic Panel: Recent Labs  Lab 05/02/24 1126 05/02/24 1139  NA 127* 129*  K 3.7 4.0  CL 95* 96*  CO2 22  --   GLUCOSE 89 89  BUN 8 7*  CREATININE 0.69 0.80  CALCIUM  8.3*  --   MG 1.9  --    GFR: CrCl cannot be calculated (Unknown ideal weight.). Liver Function Tests: Recent Labs  Lab 05/02/24 1126  AST 37  ALT 32  ALKPHOS 82  BILITOT 0.5  PROT 6.4*  ALBUMIN  3.2*   No  results for input(s): LIPASE, AMYLASE in the last 168 hours. No results for input(s): AMMONIA in the last 168 hours. Coagulation Profile: Recent Labs  Lab 05/02/24 1126  INR 1.0  Cardiac Enzymes: No results for input(s): CKTOTAL, CKMB, CKMBINDEX, TROPONINI, TROPONINIHS in the last 168 hours. BNP (last 3 results) Recent Labs    02/18/24 0014 02/22/24 0600 02/23/24 0650  BNP 256.1* 202.0* 132.4*   HbA1C: No results for input(s): HGBA1C in the last 72 hours. CBG: No results for input(s): GLUCAP in the last 168 hours. Lipid Profile: No results for input(s): CHOL, HDL, LDLCALC, TRIG, CHOLHDL, LDLDIRECT in the last 72 hours. Thyroid  Function Tests: No results for input(s): TSH, T4TOTAL, FREET4, T3FREE, THYROIDAB in the last 72 hours. Anemia Panel: Recent Labs    05/02/24 1908  FERRITIN 11  TIBC 435  IRON <10*   Urine analysis:    Component Value Date/Time   COLORURINE STRAW (A) 02/21/2024 1736   APPEARANCEUR CLEAR 02/21/2024 1736   LABSPEC 1.013 02/21/2024 1736   PHURINE 5.0 02/21/2024 1736   GLUCOSEU NEGATIVE 02/21/2024 1736   HGBUR NEGATIVE 02/21/2024 1736   BILIRUBINUR NEGATIVE 02/21/2024 1736   BILIRUBINUR neg 12/27/2022 0955   KETONESUR NEGATIVE 02/21/2024 1736   PROTEINUR NEGATIVE 02/21/2024 1736   UROBILINOGEN 0.2 12/27/2022 0955   UROBILINOGEN 0.2 03/29/2012 2003   NITRITE NEGATIVE 02/21/2024 1736   LEUKOCYTESUR NEGATIVE 02/21/2024 1736    Radiological Exams on Admission: I have personally reviewed images CT Angio Abd/Pel W and/or Wo Contrast Result Date: 05/02/2024 CLINICAL DATA:  Lower GI bleed. EXAM: CTA ABDOMEN AND PELVIS WITHOUT AND WITH CONTRAST TECHNIQUE: Multidetector CT imaging of the abdomen and pelvis was performed using the standard protocol during bolus administration of intravenous contrast. Multiplanar reconstructed images and MIPs were obtained and reviewed to evaluate the vascular anatomy. RADIATION  DOSE REDUCTION: This exam was performed according to the departmental dose-optimization program which includes automated exposure control, adjustment of the mA and/or kV according to patient size and/or use of iterative reconstruction technique. CONTRAST:  75mL OMNIPAQUE  IOHEXOL  350 MG/ML SOLN COMPARISON:  10/26/2023. FINDINGS: VASCULAR Aorta: Normal caliber aorta without aneurysm, dissection, vasculitis or significant stenosis. Aortic atherosclerosis with mural thrombus. Celiac: Patent without evidence of aneurysm, dissection, vasculitis or significant stenosis. SMA: Patent without evidence of aneurysm, dissection, vasculitis or significant stenosis. Renals: Both renal arteries are patent without evidence of aneurysm, dissection, vasculitis, fibromuscular dysplasia. Moderate renal artery stenosis is present bilaterally at the ostia. IMA: Patent. Inflow: Heavy atherosclerotic calcification with severe stenosis involving the common iliac artery on the right. There is moderate stenosis involving the common iliac artery on the left. The external and internal iliac arteries are patent bilaterally. Proximal Outflow: Bilateral common femoral and visualized portions of the superficial and profunda femoral arteries are patent without evidence of aneurysm, dissection, vasculitis or significant stenosis. Veins: No obvious venous abnormality within the limitations of this arterial phase study. Review of the MIP images confirms the above findings. NON-VASCULAR Lower chest: The heart is enlarged and there is a trace pericardial effusion. Multi-vessel coronary artery calcifications are noted. Emphysematous changes are present in the lungs. Airspace opacities are noted at the right lung base, possible atelectasis or infiltrate. Atelectasis is present at the left lung base. No pleural effusion is seen. Hepatobiliary: No focal liver abnormality is seen. Status post cholecystectomy. No biliary dilatation. Pancreas: Unremarkable. No  pancreatic ductal dilatation or surrounding inflammatory changes. Spleen: Normal in size without focal abnormality. Adrenals/Urinary Tract: There is low attenuation thickening of the left adrenal gland, possible hyperplasia. The right adrenal gland is within normal limits. No renal calculus or hydronephrosis bilaterally. The bladder is unremarkable. Stomach/Bowel: The stomach is within normal limits. No bowel  obstruction, free air, or pneumatosis is seen. Appendix appears normal. No acute or active hemorrhage is identified. Lymphatic: No abdominal or pelvic lymphadenopathy. Reproductive: The uterus and right ovary are within normal limits. There is persistent asymmetric enlargement of the left ovary measuring 3.8 cm, unchanged from multiple prior exams. Other: No abdominopelvic ascites. A small fat containing umbilical hernia is present. Musculoskeletal: Degenerative changes are noted in the thoracolumbar spine. Progression of a compression deformity in the superior endplate at L4 is noted. There is a healing fracture forming of the L4 transverse process on the right. IMPRESSION: VASCULAR 1. No evidence of active hemorrhage. 2. Aortic atherosclerosis. 3. Extensive atherosclerotic calcification of the common iliac arteries bilaterally with moderate stenosis on the left and severe stenosis on the right. NON-VASCULAR 1. No evidence of acute or active hemorrhage. 2. Patchy airspace disease at the right lung base, possible residual infiltrate. 3. Interval progression of L4 superior endplate compression deformity. 4. Cardiomegaly with multi-vessel coronary artery calcifications. 5. Emphysema. Electronically Signed   By: Leita Birmingham M.D.   On: 05/02/2024 14:57   CT Chest W Contrast Result Date: 05/02/2024 CLINICAL DATA:  Follow up pulmonary nodule. EXAM: CT CHEST WITH CONTRAST TECHNIQUE: Multidetector CT imaging of the chest was performed during intravenous contrast administration. RADIATION DOSE REDUCTION: This exam  was performed according to the departmental dose-optimization program which includes automated exposure control, adjustment of the mA and/or kV according to patient size and/or use of iterative reconstruction technique. CONTRAST:  75mL OMNIPAQUE  IOHEXOL  350 MG/ML SOLN COMPARISON:  Chest CT 12/05/2023, 10/26/2023 and 04/24/2021. PET-CT 03/30/2024 and 06/21/2021. FINDINGS: Cardiovascular: No acute vascular findings are demonstrated. Diffuse atherosclerosis of the aorta, great vessels and coronary arteries again noted. There are prominent calcifications of the aortic valve. The heart size is normal. No significant pericardial fluid. Mediastinum/Nodes: There are no enlarged mediastinal, hilar or axillary lymph nodes.Unchanged small mediastinal lymph nodes. The thyroid  gland, trachea and esophagus demonstrate no significant findings. Lungs/Pleura: No pleural effusion or pneumothorax. Previously demonstrated right pleural effusion has resolved with improving aeration of the right lung base. Moderate centrilobular and paraseptal emphysema with diffuse central airway thickening and mucous plugging at both lung bases. Mild linear atelectasis in the lingula. Solid left lower lobe pulmonary nodule measures 1.7 x 1.3 cm on image 73/5 (1.6 x 1.4 cm on 12/05/2023). This measures up to 1.8 cm on sagittal image 125/7 (previously 1.7 cm). No new or other enlarging nodules identified. Upper abdomen: The visualized upper abdomen appears stable, without significant findings. Musculoskeletal/Chest wall: There is no chest wall mass or suspicious osseous finding. Healing rib fractures on the right. Mild multilevel spondylosis. IMPRESSION: 1. Stable size of solid left lower lobe pulmonary nodule, without significant metabolic activity on PET-CT. Despite slow growth from older prior studies, this is likely a benign finding based on recent stability and negative cytology on 04/07/2024. Consider continued surveillance in 6-12 months. 2. No  new or enlarging pulmonary nodules. 3. No acute chest findings. 4. Aortic Atherosclerosis (ICD10-I70.0) and Emphysema (ICD10-J43.9). Electronically Signed   By: Elsie Perone M.D.   On: 05/02/2024 14:48   DG Chest 1 View Result Date: 05/02/2024 EXAM: 1 VIEW(S) XRAY OF THE CHEST 05/02/2024 11:43:00 AM COMPARISON: 02/21/2024 CLINICAL HISTORY: Dyspnea ; Per note:; Pt here POV with reports of dark stool and some BRB in stool for the last several weeks. Currently taking clopidigrel instead of her eliquis  due to the bleeding. Endorses some shob and intermittent chest pain. Recent placement of stent in L carotid  FINDINGS: LUNGS AND PLEURA: Ill-defined opacity at left lung base with possible small left effusion. Nodular density in left mid lung, 18 mm. No pulmonary edema. No pneumothorax. HEART AND MEDIASTINUM: Mild cardiomegaly, stable. Aortic atherosclerosis. BONES AND SOFT TISSUES: Multiple remote right rib fractures. IMPRESSION: 1. Ill-defined opacity at the left lung base with possible small left pleural effusion. 2. 18 mm nodular density in the left mid lung; recommend dedicated chest CT for further characterization. Electronically signed by: Waddell Calk MD 05/02/2024 12:04 PM EDT RP Workstation: [id number]    EKG: My personal interpretation of EKG shows: Sinus rhythm with heart rate in the 60s without any abnormalities.    Assessment/Plan Principal Problem:   GI bleeding Active Problems:   COPD (chronic obstructive pulmonary disease) (HCC)   AVM (arteriovenous malformation) of colon   Essential hypertension   Hyperlipidemia    GI Bleed: Pt with declining hgb but HDS suspect GIB given dark stools. This appears to be a subacute problem for the patient. Had hx of LGIB treated in 2021. Currently holding plavix  for washout prior to endoscopy. Iron studies ordered.  -GI consult, appreciate their assistance -Maintain adequate acess with 2 large bore PIV -H/H q8hrs, transfuse if <7   Chronic  Problems: HTN: holding home BP meds given concern for bleeding.  HLD: continue home lipitor  COPD: Does not have 2/3 cardinal signs of COPD exacerbation suspect dyspnea is 2/2 to anemia. Atrial fibrillation: currently in NSR. Will continue amiodarone  but hold AC given the bleeding concern.  Hyponatremia: appears to be chronic for this patient. CTM dailly.   VTE prophylaxis:  SCDs Diet: NPO Lines: PIV Code Status:  Full Code HCDM: Telemetry:  Admission status: Inpatient, Telemetry bed Patient is from: Home  Anticipated d/c is to: Home  Anticipated d/c date is: 2 days    Family Communication: Updated at bedside   Consults called: GI   Severity of Illness: The appropriate patient status for this patient is INPATIENT. Inpatient status is judged to be reasonable and necessary in order to provide the required intensity of service to ensure the patient's safety. The patient's presenting symptoms, physical exam findings, and initial radiographic and laboratory data in the context of their chronic comorbidities is felt to place them at high risk for further clinical deterioration. Furthermore, it is not anticipated that the patient will be medically stable for discharge from the hospital within 2 midnights of admission.   * I certify that at the point of admission it is my clinical judgment that the patient will require inpatient hospital care spanning beyond 2 midnights from the point of admission due to high intensity of service, high risk for further deterioration and high frequency of surveillance required.DEWAINE Morene Bathe, MD Jolynn DEL. [facility]

## 2024-05-02 NOTE — Progress Notes (Signed)
 Received patient from ED via wheelchair, Aox4. 2 baqgs at the bedside, 1 personal pillow, and 1 platic of patient's belonging placed in her cubby. VS taken and stable,

## 2024-05-02 NOTE — ED Provider Triage Note (Signed)
 Emergency Medicine Provider Triage Evaluation Note  Denise [name] , a 63 y.o. female  was evaluated in triage.  Pt complains of dark stools as well as some bright red bleeding per rectum.  Is on Eliquis  secondary to A-fib diagnosis.  Was advised by her provider to present to the ED for evaluation of GI bleed and potential anemia.  Has required blood infusions in the past secondary to blood loss anemia..  Review of Systems  Positive: As above Negative:   Physical Exam  There were no vitals taken for this visit. Gen:   Awake, no distress   Resp:  Normal effort, tachypneic, normal pulmonary auscultation. MSK:   Moves extremities without difficulty  Other:    Medical Decision Making  Medically screening exam initiated at 11:17 AM.  Appropriate orders placed.  Denise [name] was informed that the remainder of the evaluation will be completed by another provider, this initial triage assessment does not replace that evaluation, and the importance of remaining in the ED until their evaluation is complete.  Orders for GI bleed placed.   Denise [name], Denise [name] 05/02/24 1119

## 2024-05-02 NOTE — ED Provider Notes (Signed)
 [place] EMERGENCY DEPARTMENT AT [facility] Provider Note   CSN: [id number] Arrival date & time: 05/02/24  1059     Patient presents with: Rectal Bleeding   Denise [name] is a 63 y.o. female.    Rectal Bleeding Patient presents for blood in stool.  Medical history includes HTN, HLD, COPD, anemia, CAD, prior GI bleed, atrial fibrillation.  In 2021, she underwent upper and lower endoscopy.  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  This was treated with APC.  Over the past several weeks, she has had recurrence of blood in stool.  This seemed to worsen when she switched from clopidogrel  to baby aspirin  and Eliquis .  She was only on the Eliquis  for 1 day.  She switched back to the clopidogrel .  Patient describes intermittent black as well as red blood in stool.  She has had fatigue, exertional shortness of breath, generalized weakness.  She denies any associated abdominal pain.     Prior to Admission medications   Medication Sig Start Date End Date Taking? Authorizing Provider  acetaminophen  (TYLENOL ) 500 MG tablet Take 2 tablets (1,000 mg total) by mouth every 8 (eight) hours as needed. 10/31/23   Maczis, Michael M, PA-C  albuterol  (VENTOLIN  HFA) 108 (90 Base) MCG/ACT inhaler INHALE 2 PUFFS INTO THE LUNGS EVERY 4-6 HOURS AS NEEDED FOR TIGHTNESS/WHEEZING 01/31/24   Gretta Comer POUR, NP  amiodarone  (PACERONE ) 200 MG tablet Take 1 tablet (200 mg total) by mouth daily. 03/19/24   West, Katlyn D, NP  amLODipine  (NORVASC ) 10 MG tablet Take 1 tablet (10 mg total) by mouth daily. 04/09/24 07/08/24  West, Katlyn D, NP  apixaban  (ELIQUIS ) 5 MG TABS tablet Take 1 tablet (5 mg total) by mouth 2 (two) times daily. 04/22/24   West, Katlyn D, NP  Ascorbic Acid  (VITAMIN C ) 1000 MG tablet Take 2,000 mg by mouth daily.    [provider]  aspirin  EC 81 MG tablet Take 1 tablet (81 mg total) by mouth daily. Swallow whole. 04/22/24   West, Katlyn D, NP  atorvastatin  (LIPITOR ) 80 MG tablet TAKE  1 TABLET (80 MG TOTAL) BY MOUTH DAILY FOR CHOLESTEROL 04/11/24 04/11/25  Clark, Katherine K, NP  busPIRone  (BUSPAR ) 10 MG tablet Take 1 tablet (10 mg total) by mouth 2 (two) times daily. For anxiety 04/17/24   Clark, Katherine K, NP  escitalopram  (LEXAPRO ) 20 MG tablet TAKE 1 TABLET BY MOUTH DAILY FOR ANXIETY 12/15/23   Gretta Comer POUR, NP  ferrous sulfate  325 (65 FE) MG tablet Take 650 mg by mouth daily with breakfast. Patient not taking: Reported on 04/17/2024    [provider]  fluticasone -salmeterol (WIXELA INHUB) 250-50 MCG/ACT AEPB Inhale 1 puff into the lungs in the morning and at bedtime. 04/07/24   Isadora Hose, MD  lisinopril  (ZESTRIL ) 30 MG tablet TAKE 1 TABLET (30 MG TOTAL) BY MOUTH DAILY FOR BLOOD PRESSURE. 04/27/24   Clark, Katherine K, NP  metoprolol  tartrate (LOPRESSOR ) 50 MG tablet Take 1 tablet (50 mg total) by mouth 2 (two) times daily. 03/19/24   West, Katlyn D, NP  pantoprazole  (PROTONIX ) 40 MG tablet Take 1 tablet (40 mg total) by mouth daily. For heartburn 04/17/24   Gretta Comer POUR, NP  pyridoxine  (B-6) 100 MG tablet Take 100 mg by mouth daily.    [provider]  sodium chloride  1 g tablet TAKE 1 TABLET (1 G TOTAL) BY MOUTH 2 (TWO) TIMES DAILY WITH A MEAL. TO INCREASE SODIUM 03/16/24  Clark, Katherine K, NP  Tiotropium Bromide Monohydrate  (SPIRIVA  RESPIMAT) 2.5 MCG/ACT AERS Inhale 2 puffs into the lungs daily. 04/07/24   Isadora Hose, MD  vitamin B-12 (CYANOCOBALAMIN ) 1000 MCG tablet Take 1,000 mcg by mouth daily.    [provider]  vitamin E  180 MG (400 UNITS) capsule Take 400 Units by mouth daily.    [provider]    Allergies: Patient has no known allergies.    Review of Systems  Constitutional:  Positive for fatigue.  Respiratory:  Positive for shortness of breath.   Gastrointestinal:  Positive for blood in stool and hematochezia.  Neurological:  Positive for weakness (Generalized).  All other systems reviewed and are  negative.   Updated Vital Signs BP (!) 119/58 (BP Location: Left Arm)   Pulse 70   Temp 98.8 F (37.1 C) (Oral)   Resp 18   Ht 5' 3 (1.6 m)   SpO2 93%   BMI 28.34 kg/m   Physical Exam Vitals and nursing note reviewed.  Constitutional:      General: She is not in acute distress.    Appearance: Normal appearance. She is well-developed. She is not ill-appearing, toxic-appearing or diaphoretic.  HENT:     Head: Normocephalic and atraumatic.     Right Ear: External ear normal.     Left Ear: External ear normal.     Nose: Nose normal.     Mouth/Throat:     Mouth: Mucous membranes are moist.  Eyes:     Extraocular Movements: Extraocular movements intact.     Conjunctiva/sclera: Conjunctivae normal.  Cardiovascular:     Rate and Rhythm: Normal rate and regular rhythm.     Heart sounds: No murmur heard. Pulmonary:     Effort: Pulmonary effort is normal. No respiratory distress.     Breath sounds: No wheezing, rhonchi or rales.  Abdominal:     General: There is no distension.     Palpations: Abdomen is soft.     Tenderness: There is no abdominal tenderness.  Musculoskeletal:        General: No swelling. Normal range of motion.     Cervical back: Normal range of motion and neck supple.  Skin:    General: Skin is warm and dry.     Coloration: Skin is pale. Skin is not jaundiced.  Neurological:     General: No focal deficit present.     Mental Status: She is alert and oriented to person, place, and time.  Psychiatric:        Mood and Affect: Mood normal.        Behavior: Behavior normal.     (all labs ordered are listed, but only abnormal results are displayed) Labs Reviewed  COMPREHENSIVE METABOLIC PANEL WITH GFR - Abnormal; Notable for the following components:      Result Value   Sodium 127 (*)    Chloride 95 (*)    Calcium  8.3 (*)    Total Protein 6.4 (*)    Albumin  3.2 (*)    All other components within normal limits  CBC WITH DIFFERENTIAL/PLATELET - Abnormal;  Notable for the following components:   RBC 2.79 (*)    Hemoglobin 8.0 (*)    HCT 26.2 (*)    RDW 17.3 (*)    Platelets 461 (*)    nRBC 0.3 (*)    All other components within normal limits  HEMOGLOBIN AND HEMATOCRIT, BLOOD - Abnormal; Notable for the following components:   Hemoglobin 8.1 (*)  HCT 25.0 (*)    All other components within normal limits  I-STAT CHEM 8, ED - Abnormal; Notable for the following components:   Sodium 129 (*)    Chloride 96 (*)    BUN 7 (*)    Calcium , Ion 1.06 (*)    Hemoglobin 9.5 (*)    HCT 28.0 (*)    All other components within normal limits  PROTIME-INR  MAGNESIUM   HEMOGLOBIN AND HEMATOCRIT, BLOOD  IRON AND TIBC  FERRITIN  CBC  BASIC METABOLIC PANEL WITH GFR  HEMOGLOBIN AND HEMATOCRIT, BLOOD  POC OCCULT BLOOD, ED  TYPE AND SCREEN  PREPARE RBC (CROSSMATCH)    EKG: EKG Interpretation Date/Time:  Saturday May 02 2024 12:30:54 EDT Ventricular Rate:  65 PR Interval:  179 QRS Duration:  88 QT Interval:  440 QTC Calculation: 458 R Axis:   58  Text Interpretation: Sinus rhythm Confirmed by Melvenia Motto [phone]) on 05/02/2024 1:09:49 PM  Radiology: CT Angio Abd/Pel W and/or Wo Contrast Result Date: 05/02/2024 CLINICAL DATA:  Lower GI bleed. EXAM: CTA ABDOMEN AND PELVIS WITHOUT AND WITH CONTRAST TECHNIQUE: Multidetector CT imaging of the abdomen and pelvis was performed using the standard protocol during bolus administration of intravenous contrast. Multiplanar reconstructed images and MIPs were obtained and reviewed to evaluate the vascular anatomy. RADIATION DOSE REDUCTION: This exam was performed according to the departmental dose-optimization program which includes automated exposure control, adjustment of the mA and/or kV according to patient size and/or use of iterative reconstruction technique. CONTRAST:  75mL OMNIPAQUE  IOHEXOL  350 MG/ML SOLN COMPARISON:  10/26/2023. FINDINGS: VASCULAR Aorta: Normal caliber aorta without aneurysm,  dissection, vasculitis or significant stenosis. Aortic atherosclerosis with mural thrombus. Celiac: Patent without evidence of aneurysm, dissection, vasculitis or significant stenosis. SMA: Patent without evidence of aneurysm, dissection, vasculitis or significant stenosis. Renals: Both renal arteries are patent without evidence of aneurysm, dissection, vasculitis, fibromuscular dysplasia. Moderate renal artery stenosis is present bilaterally at the ostia. IMA: Patent. Inflow: Heavy atherosclerotic calcification with severe stenosis involving the common iliac artery on the right. There is moderate stenosis involving the common iliac artery on the left. The external and internal iliac arteries are patent bilaterally. Proximal Outflow: Bilateral common femoral and visualized portions of the superficial and profunda femoral arteries are patent without evidence of aneurysm, dissection, vasculitis or significant stenosis. Veins: No obvious venous abnormality within the limitations of this arterial phase study. Review of the MIP images confirms the above findings. NON-VASCULAR Lower chest: The heart is enlarged and there is a trace pericardial effusion. Multi-vessel coronary artery calcifications are noted. Emphysematous changes are present in the lungs. Airspace opacities are noted at the right lung base, possible atelectasis or infiltrate. Atelectasis is present at the left lung base. No pleural effusion is seen. Hepatobiliary: No focal liver abnormality is seen. Status post cholecystectomy. No biliary dilatation. Pancreas: Unremarkable. No pancreatic ductal dilatation or surrounding inflammatory changes. Spleen: Normal in size without focal abnormality. Adrenals/Urinary Tract: There is low attenuation thickening of the left adrenal gland, possible hyperplasia. The right adrenal gland is within normal limits. No renal calculus or hydronephrosis bilaterally. The bladder is unremarkable. Stomach/Bowel: The stomach is  within normal limits. No bowel obstruction, free air, or pneumatosis is seen. Appendix appears normal. No acute or active hemorrhage is identified. Lymphatic: No abdominal or pelvic lymphadenopathy. Reproductive: The uterus and right ovary are within normal limits. There is persistent asymmetric enlargement of the left ovary measuring 3.8 cm, unchanged from multiple prior exams. Other: No abdominopelvic ascites. A  small fat containing umbilical hernia is present. Musculoskeletal: Degenerative changes are noted in the thoracolumbar spine. Progression of a compression deformity in the superior endplate at L4 is noted. There is a healing fracture forming of the L4 transverse process on the right. IMPRESSION: VASCULAR 1. No evidence of active hemorrhage. 2. Aortic atherosclerosis. 3. Extensive atherosclerotic calcification of the common iliac arteries bilaterally with moderate stenosis on the left and severe stenosis on the right. NON-VASCULAR 1. No evidence of acute or active hemorrhage. 2. Patchy airspace disease at the right lung base, possible residual infiltrate. 3. Interval progression of L4 superior endplate compression deformity. 4. Cardiomegaly with multi-vessel coronary artery calcifications. 5. Emphysema. Electronically Signed   By: Leita Birmingham M.D.   On: 05/02/2024 14:57   CT Chest W Contrast Result Date: 05/02/2024 CLINICAL DATA:  Follow up pulmonary nodule. EXAM: CT CHEST WITH CONTRAST TECHNIQUE: Multidetector CT imaging of the chest was performed during intravenous contrast administration. RADIATION DOSE REDUCTION: This exam was performed according to the departmental dose-optimization program which includes automated exposure control, adjustment of the mA and/or kV according to patient size and/or use of iterative reconstruction technique. CONTRAST:  75mL OMNIPAQUE  IOHEXOL  350 MG/ML SOLN COMPARISON:  Chest CT 12/05/2023, 10/26/2023 and 04/24/2021. PET-CT 03/30/2024 and 06/21/2021. FINDINGS:  Cardiovascular: No acute vascular findings are demonstrated. Diffuse atherosclerosis of the aorta, great vessels and coronary arteries again noted. There are prominent calcifications of the aortic valve. The heart size is normal. No significant pericardial fluid. Mediastinum/Nodes: There are no enlarged mediastinal, hilar or axillary lymph nodes.Unchanged small mediastinal lymph nodes. The thyroid  gland, trachea and esophagus demonstrate no significant findings. Lungs/Pleura: No pleural effusion or pneumothorax. Previously demonstrated right pleural effusion has resolved with improving aeration of the right lung base. Moderate centrilobular and paraseptal emphysema with diffuse central airway thickening and mucous plugging at both lung bases. Mild linear atelectasis in the lingula. Solid left lower lobe pulmonary nodule measures 1.7 x 1.3 cm on image 73/5 (1.6 x 1.4 cm on 12/05/2023). This measures up to 1.8 cm on sagittal image 125/7 (previously 1.7 cm). No new or other enlarging nodules identified. Upper abdomen: The visualized upper abdomen appears stable, without significant findings. Musculoskeletal/Chest wall: There is no chest wall mass or suspicious osseous finding. Healing rib fractures on the right. Mild multilevel spondylosis. IMPRESSION: 1. Stable size of solid left lower lobe pulmonary nodule, without significant metabolic activity on PET-CT. Despite slow growth from older prior studies, this is likely a benign finding based on recent stability and negative cytology on 04/07/2024. Consider continued surveillance in 6-12 months. 2. No new or enlarging pulmonary nodules. 3. No acute chest findings. 4. Aortic Atherosclerosis (ICD10-I70.0) and Emphysema (ICD10-J43.9). Electronically Signed   By: Elsie Perone M.D.   On: 05/02/2024 14:48   DG Chest 1 View Result Date: 05/02/2024 EXAM: 1 VIEW(S) XRAY OF THE CHEST 05/02/2024 11:43:00 AM COMPARISON: 02/21/2024 CLINICAL HISTORY: Dyspnea ; Per note:; Pt here  POV with reports of dark stool and some BRB in stool for the last several weeks. Currently taking clopidigrel instead of her eliquis  due to the bleeding. Endorses some shob and intermittent chest pain. Recent placement of stent in L carotid FINDINGS: LUNGS AND PLEURA: Ill-defined opacity at left lung base with possible small left effusion. Nodular density in left mid lung, 18 mm. No pulmonary edema. No pneumothorax. HEART AND MEDIASTINUM: Mild cardiomegaly, stable. Aortic atherosclerosis. BONES AND SOFT TISSUES: Multiple remote right rib fractures. IMPRESSION: 1. Ill-defined opacity at the left lung base with  possible small left pleural effusion. 2. 18 mm nodular density in the left mid lung; recommend dedicated chest CT for further characterization. Electronically signed by: Waddell Calk MD 05/02/2024 12:04 PM EDT RP Workstation: [id number]     Procedures   Medications Ordered in the ED  0.9 %  sodium chloride  infusion (Manually program via Guardrails IV Fluids) (has no administration in time range)  acetaminophen  (TYLENOL ) tablet 650 mg (has no administration in time range)    Or  acetaminophen  (TYLENOL ) suppository 650 mg (has no administration in time range)  iohexol  (OMNIPAQUE ) 350 MG/ML injection 75 mL (75 mLs Intravenous Contrast Given 05/02/24 1427)                                    Medical Decision Making Amount and/or Complexity of Data Reviewed Labs: ordered. Radiology: ordered.  Risk OTC drugs. Prescription drug management. Decision regarding hospitalization.   This patient presents to the ED for concern of hematochezia, this involves an extensive number of treatment options, and is a complaint that carries with it a high risk of complications and morbidity.  The differential diagnosis includes blood loss anemia, lower GI bleed, upper GI bleed with rapid transit   Co morbidities / Chronic conditions that complicate the patient evaluation  HTN, HLD, COPD, anemia, CAD,  prior GI bleed, atrial fibrillation   Additional history obtained:  Additional history obtained from EMR External records from outside source obtained and reviewed including N/A   Lab Tests:  I Ordered, and personally interpreted labs.  The pertinent results include: Hemoglobin of 8.0, decreased from baseline of 13; hyponatremia is present which seems to be chronic for her.     Imaging Studies ordered:  I ordered imaging studies including chest x-ray, CT chest, CTA abdomen and pelvis I independently visualized and interpreted imaging which showed redemonstration of stable left lower lobe nodule, right lower lobe patchy airspace disease, interval progression of L4 superior endplate compression deformity I agree with the radiologist interpretation   Cardiac Monitoring: / EKG:  The patient was maintained on a cardiac monitor.  I personally viewed and interpreted the cardiac monitored which showed an underlying rhythm of: Sinus rhythm   Problem List / ED Course / Critical interventions / Medication management  Patient presenting for ongoing blood in stool for the past several weeks.  Vital signs on arrival are normal.  Although pale, patient is otherwise well-appearing on exam.  Her abdomen is soft and nontender.  She has a history of cecal AVM treated 4 years ago.  She has been on clopidogrel .  Patient has baseline hemoglobin of about 13, identified as recently as 3 weeks ago.  Hemoglobin today is 8.0.  Patient was consented for blood transfusion.  I spoke with [facility], [facility] [place], who states that team will see in consult.  Patient's x-ray does show a 18 mm nodule.  CT was ordered to further characterize.  Will obtain CTA of abdomen as well.  CT imaging of nodule appears similar to prior studies.  She has nonspecific right lung base patchy airspace disease.  She has interval progression of L4 superior endplate compression deformity.  No contrast extravasation was identified.  Patient  was admitted for further management. I ordered medication including PRBCs for symptomatic anemia Reevaluation of the patient after these medicines showed that the patient improved I have reviewed the patients home medicines and have made adjustments as needed  Consultations Obtained:  I requested consultation with the gastroenterology team, PA [place],  and discussed lab and imaging findings as well as pertinent plan - they recommend: GI team will see in consult   Social Determinants of Health:  Has PCP  CRITICAL CARE Performed by: Bernardino Fireman   Total critical care time: 32 minutes  Critical care time was exclusive of separately billable procedures and treating other patients.  Critical care was necessary to treat or prevent imminent or life-threatening deterioration.  Critical care was time spent personally by me on the following activities: development of treatment plan with patient and/or surrogate as well as nursing, discussions with consultants, evaluation of patient's response to treatment, examination of patient, obtaining history from patient or surrogate, ordering and performing treatments and interventions, ordering and review of laboratory studies, ordering and review of radiographic studies, pulse oximetry and re-evaluation of patient's condition.      Final diagnoses:  Acute GI bleeding  Symptomatic anemia    ED Discharge Orders     None          Fireman Bernardino, MD 05/02/24 2006

## 2024-05-03 DIAGNOSIS — K922 Gastrointestinal hemorrhage, unspecified: Secondary | ICD-10-CM | POA: Diagnosis not present

## 2024-05-03 DIAGNOSIS — Z7902 Long term (current) use of antithrombotics/antiplatelets: Secondary | ICD-10-CM

## 2024-05-03 DIAGNOSIS — D649 Anemia, unspecified: Secondary | ICD-10-CM

## 2024-05-03 LAB — CBC
HCT: 24.2 % — ABNORMAL LOW (ref 36.0–46.0)
Hemoglobin: 7.6 g/dL — ABNORMAL LOW (ref 12.0–15.0)
MCH: 28.5 pg (ref 26.0–34.0)
MCHC: 31.4 g/dL (ref 30.0–36.0)
MCV: 90.6 fL (ref 80.0–100.0)
Platelets: 363 K/uL (ref 150–400)
RBC: 2.67 MIL/uL — ABNORMAL LOW (ref 3.87–5.11)
RDW: 17.5 % — ABNORMAL HIGH (ref 11.5–15.5)
WBC: 7.5 K/uL (ref 4.0–10.5)
nRBC: 0.3 % — ABNORMAL HIGH (ref 0.0–0.2)

## 2024-05-03 LAB — HEMOGLOBIN AND HEMATOCRIT, BLOOD
HCT: 30.1 % — ABNORMAL LOW (ref 36.0–46.0)
HCT: 30.6 % — ABNORMAL LOW (ref 36.0–46.0)
Hemoglobin: 10 g/dL — ABNORMAL LOW (ref 12.0–15.0)
Hemoglobin: 9.9 g/dL — ABNORMAL LOW (ref 12.0–15.0)

## 2024-05-03 LAB — BASIC METABOLIC PANEL WITH GFR
Anion gap: 9 (ref 5–15)
BUN: <5 mg/dL — ABNORMAL LOW (ref 8–23)
CO2: 24 mmol/L (ref 22–32)
Calcium: 8.4 mg/dL — ABNORMAL LOW (ref 8.9–10.3)
Chloride: 100 mmol/L (ref 98–111)
Creatinine, Ser: 0.59 mg/dL (ref 0.44–1.00)
GFR, Estimated: >60 mL/min (ref >60)
Glucose, Bld: 102 mg/dL — ABNORMAL HIGH (ref 70–99)
Potassium: 3.7 mmol/L (ref 3.5–5.1)
Sodium: 133 mmol/L — ABNORMAL LOW (ref 135–145)

## 2024-05-03 MED ORDER — IRON SUCROSE 200 MG IVPB - SIMPLE MED
200.0000 mg | Freq: Once | Status: DC
  Filled 2024-05-03: qty 110

## 2024-05-03 MED ORDER — ALPRAZOLAM 0.25 MG PO TABS
0.2500 mg | ORAL_TABLET | Freq: Two times a day (BID) | ORAL | Status: DC | PRN
  Administered 2024-05-03: 0.25 mg via ORAL
  Filled 2024-05-03: qty 1

## 2024-05-03 MED ORDER — SODIUM CHLORIDE 0.9 % IV SOLN
200.0000 mg | Freq: Once | INTRAVENOUS | Status: AC
  Administered 2024-05-03: 200 mg via INTRAVENOUS
  Filled 2024-05-03: qty 10

## 2024-05-03 NOTE — Progress Notes (Signed)
 PROGRESS NOTE  Denise [name] FMW:[id number] DOB: 1960/10/15 DOA: 05/02/2024 PCP: Gretta Comer POUR, NP   LOS: 1 day   Brief Narrative / Interim history: Denise [name] is a 63 y.o. year old female with past medical history of GI bleed 2/2 to to AVM, HTN, HLD c/b left carotid stenosis s/p stent placement in 02/2024, COPD, Afib presenting today for blood in her stools.  This has been going on for few weeks, was referred to GI as an outpatient but developed worsening fatigue, weakness, shortness of breath and decided to come to the hospital.  Was found to have a hemoglobin of 8 from a baseline of 13 about 3 weeks ago.  GI was consulted  Subjective / 24h Interval events: She is doing well this morning, denies any abdominal pain, no nausea or vomiting.  Assesement and Plan: Principal problem Acute blood loss anemia in the setting of GI bleed-GI bleed is likely upper given dark stools.  Hemoglobin dipped down to 7.6 overnight, she was ordered 2 units of packed red blood cells, second unit unfortunately stopped halfway for an unknown amount of time - Continue to monitor, appreciate GI follow-up, plans for endoscopic studies next week - Start iron supplementation as she is iron deficient  Active problems PAF-currently in sinus, continue amiodarone , anticoagulated with Eliquis  at home but now on hold due to bleeding  Hyperlipidemia-continue statin  Hyponatremia-appears chronic, improving  Underlying COPD-stable, no wheezing, this at baseline  Essential hypertension-she is normotensive, home blood pressure medications are on hold due to ongoing bleeding  Scheduled Meds:  sodium chloride    Intravenous Once   amiodarone   200 mg Oral Daily   atorvastatin   80 mg Oral Daily   busPIRone   10 mg Oral BID   escitalopram   20 mg Oral Daily   fluticasone  furoate-vilanterol  1 puff Inhalation Daily   pantoprazole   40 mg Oral Daily   umeclidinium bromide   1 puff Inhalation Daily    Continuous Infusions: PRN Meds:.acetaminophen  **OR** acetaminophen , albuterol   Current Outpatient Medications  Medication Instructions   acetaminophen  (TYLENOL ) 1,000 mg, Oral, Every 8 hours PRN   albuterol  (VENTOLIN  HFA) 108 (90 Base) MCG/ACT inhaler INHALE 2 PUFFS INTO THE LUNGS EVERY 4-6 HOURS AS NEEDED FOR TIGHTNESS/WHEEZING   amiodarone  (PACERONE ) 200 mg, Oral, Daily   amLODipine  (NORVASC ) 10 mg, Oral, Daily   apixaban  (ELIQUIS ) 5 mg, Oral, 2 times daily   aspirin  EC 81 mg, Oral, Daily, Swallow whole.   atorvastatin  (LIPITOR ) 80 mg, Oral, Daily, for cholesterol.   busPIRone  (BUSPAR ) 10 mg, Oral, 2 times daily, For anxiety   clopidogrel  (PLAVIX ) 75 mg, Daily   cyanocobalamin  (VITAMIN B12) 1,000 mcg, Daily   escitalopram  (LEXAPRO ) 20 MG tablet TAKE 1 TABLET BY MOUTH DAILY FOR ANXIETY   [Paused] ferrous sulfate  650 mg, Daily with breakfast   fluticasone -salmeterol (WIXELA INHUB) 250-50 MCG/ACT AEPB 1 puff, Inhalation, 2 times daily   lisinopril  (ZESTRIL ) 30 MG tablet TAKE 1 TABLET (30 MG TOTAL) BY MOUTH DAILY FOR BLOOD PRESSURE.   metoprolol  tartrate (LOPRESSOR ) 50 mg, Oral, 2 times daily   pantoprazole  (PROTONIX ) 40 mg, Oral, Daily, For heartburn   pyridoxine  (B-6) 100 mg, Daily   sodium chloride  1 g, Oral, 2 times daily with meals, To increase sodium   Tiotropium Bromide Monohydrate  (SPIRIVA  RESPIMAT) 2.5 MCG/ACT AERS 2 puffs, Inhalation, Daily   vitamin C  2,000 mg, Daily   vitamin E  400 Units, Daily    Diet Orders (From admission, onward)     Start  Ordered   05/02/24 1812  Diet regular Room service appropriate? Yes; Fluid consistency: Thin  Diet effective now       Question Answer Comment  Room service appropriate? Yes   Fluid consistency: Thin      05/02/24 1814            DVT prophylaxis: SCDs Start: 05/02/24 1615   Lab Results  Component Value Date   PLT 363 05/03/2024      Code Status: Full Code  Family Communication: No family at  bedside  Status is: Inpatient Remains inpatient appropriate because: Severity of illness   Level of care: Telemetry Medical  Consultants:  Gastroenterology  Objective: Vitals:   05/03/24 0514 05/03/24 0519 05/03/24 0534 05/03/24 0916  BP: (!) 134/56 (!) 134/56 133/62 121/60  Pulse: 68 68 73 70  Resp: 18 18 18 18   Temp: 99.1 F (37.3 C) 99.1 F (37.3 C) 98.9 F (37.2 C) 98.6 F (37 C)  TempSrc: Oral Oral Oral   SpO2:  91% 90% 93%  Height:       No intake or output data in the 24 hours ending 05/03/24 0943 Wt Readings from Last 3 Encounters:  04/17/24 72.6 kg  04/10/24 72.9 kg  04/09/24 72.5 kg    Examination:  Constitutional: NAD Eyes: no scleral icterus ENMT: Mucous membranes are moist.  Neck: normal, supple Respiratory: clear to auscultation bilaterally, no wheezing, no crackles. Normal respiratory effort. No accessory muscle use.  Cardiovascular: Regular rate and rhythm, no murmurs / rubs / gallops. No LE edema.  Abdomen: non distended, no tenderness. Bowel sounds positive.  Musculoskeletal: no clubbing / cyanosis.    Data Reviewed: I have independently reviewed following labs and imaging studies   CBC Recent Labs  Lab 05/02/24 1126 05/02/24 1139 05/02/24 1908 05/03/24 0008  WBC 9.0  --   --  7.5  HGB 8.0* 9.5* 8.1* 7.6*  HCT 26.2* 28.0* 25.0* 24.2*  PLT 461*  --   --  363  MCV 93.9  --   --  90.6  MCH 28.7  --   --  28.5  MCHC 30.5  --   --  31.4  RDW 17.3*  --   --  17.5*  LYMPHSABS 0.8  --   --   --   MONOABS 0.9  --   --   --   EOSABS 0.1  --   --   --   BASOSABS 0.0  --   --   --     Recent Labs  Lab 05/02/24 1126 05/02/24 1139 05/03/24 0008  NA 127* 129* 133*  K 3.7 4.0 3.7  CL 95* 96* 100  CO2 22  --  24  GLUCOSE 89 89 102*  BUN 8 7* <5*  CREATININE 0.69 0.80 0.59  CALCIUM  8.3*  --  8.4*  AST 37  --   --   ALT 32  --   --   ALKPHOS 82  --   --   BILITOT 0.5  --   --   ALBUMIN  3.2*  --   --   MG 1.9  --   --   INR 1.0  --    --     ------------------------------------------------------------------------------------------------------------------ No results for input(s): CHOL, HDL, LDLCALC, TRIG, CHOLHDL, LDLDIRECT in the last 72 hours.  Lab Results  Component Value Date   HGBA1C 5.4 12/27/2022   ------------------------------------------------------------------------------------------------------------------ No results for input(s): TSH, T4TOTAL, T3FREE, THYROIDAB in the last 72 hours.  Invalid  input(s): FREET3  Cardiac Enzymes No results for input(s): CKMB, TROPONINI, MYOGLOBIN in the last 168 hours.  Invalid input(s): CK ------------------------------------------------------------------------------------------------------------------    Component Value Date/Time   BNP 132.4 (H) 02/23/2024 0650    CBG: No results for input(s): GLUCAP in the last 168 hours.  No results found for this or any previous visit (from the past 240 hours).   Radiology Studies: CT Angio Abd/Pel W and/or Wo Contrast Result Date: 05/02/2024 CLINICAL DATA:  Lower GI bleed. EXAM: CTA ABDOMEN AND PELVIS WITHOUT AND WITH CONTRAST TECHNIQUE: Multidetector CT imaging of the abdomen and pelvis was performed using the standard protocol during bolus administration of intravenous contrast. Multiplanar reconstructed images and MIPs were obtained and reviewed to evaluate the vascular anatomy. RADIATION DOSE REDUCTION: This exam was performed according to the departmental dose-optimization program which includes automated exposure control, adjustment of the mA and/or kV according to patient size and/or use of iterative reconstruction technique. CONTRAST:  75mL OMNIPAQUE  IOHEXOL  350 MG/ML SOLN COMPARISON:  10/26/2023. FINDINGS: VASCULAR Aorta: Normal caliber aorta without aneurysm, dissection, vasculitis or significant stenosis. Aortic atherosclerosis with mural thrombus. Celiac: Patent without evidence of  aneurysm, dissection, vasculitis or significant stenosis. SMA: Patent without evidence of aneurysm, dissection, vasculitis or significant stenosis. Renals: Both renal arteries are patent without evidence of aneurysm, dissection, vasculitis, fibromuscular dysplasia. Moderate renal artery stenosis is present bilaterally at the ostia. IMA: Patent. Inflow: Heavy atherosclerotic calcification with severe stenosis involving the common iliac artery on the right. There is moderate stenosis involving the common iliac artery on the left. The external and internal iliac arteries are patent bilaterally. Proximal Outflow: Bilateral common femoral and visualized portions of the superficial and profunda femoral arteries are patent without evidence of aneurysm, dissection, vasculitis or significant stenosis. Veins: No obvious venous abnormality within the limitations of this arterial phase study. Review of the MIP images confirms the above findings. NON-VASCULAR Lower chest: The heart is enlarged and there is a trace pericardial effusion. Multi-vessel coronary artery calcifications are noted. Emphysematous changes are present in the lungs. Airspace opacities are noted at the right lung base, possible atelectasis or infiltrate. Atelectasis is present at the left lung base. No pleural effusion is seen. Hepatobiliary: No focal liver abnormality is seen. Status post cholecystectomy. No biliary dilatation. Pancreas: Unremarkable. No pancreatic ductal dilatation or surrounding inflammatory changes. Spleen: Normal in size without focal abnormality. Adrenals/Urinary Tract: There is low attenuation thickening of the left adrenal gland, possible hyperplasia. The right adrenal gland is within normal limits. No renal calculus or hydronephrosis bilaterally. The bladder is unremarkable. Stomach/Bowel: The stomach is within normal limits. No bowel obstruction, free air, or pneumatosis is seen. Appendix appears normal. No acute or active  hemorrhage is identified. Lymphatic: No abdominal or pelvic lymphadenopathy. Reproductive: The uterus and right ovary are within normal limits. There is persistent asymmetric enlargement of the left ovary measuring 3.8 cm, unchanged from multiple prior exams. Other: No abdominopelvic ascites. A small fat containing umbilical hernia is present. Musculoskeletal: Degenerative changes are noted in the thoracolumbar spine. Progression of a compression deformity in the superior endplate at L4 is noted. There is a healing fracture forming of the L4 transverse process on the right. IMPRESSION: VASCULAR 1. No evidence of active hemorrhage. 2. Aortic atherosclerosis. 3. Extensive atherosclerotic calcification of the common iliac arteries bilaterally with moderate stenosis on the left and severe stenosis on the right. NON-VASCULAR 1. No evidence of acute or active hemorrhage. 2. Patchy airspace disease at the right lung base, possible residual  infiltrate. 3. Interval progression of L4 superior endplate compression deformity. 4. Cardiomegaly with multi-vessel coronary artery calcifications. 5. Emphysema. Electronically Signed   By: Leita Birmingham M.D.   On: 05/02/2024 14:57   CT Chest W Contrast Result Date: 05/02/2024 CLINICAL DATA:  Follow up pulmonary nodule. EXAM: CT CHEST WITH CONTRAST TECHNIQUE: Multidetector CT imaging of the chest was performed during intravenous contrast administration. RADIATION DOSE REDUCTION: This exam was performed according to the departmental dose-optimization program which includes automated exposure control, adjustment of the mA and/or kV according to patient size and/or use of iterative reconstruction technique. CONTRAST:  75mL OMNIPAQUE  IOHEXOL  350 MG/ML SOLN COMPARISON:  Chest CT 12/05/2023, 10/26/2023 and 04/24/2021. PET-CT 03/30/2024 and 06/21/2021. FINDINGS: Cardiovascular: No acute vascular findings are demonstrated. Diffuse atherosclerosis of the aorta, great vessels and coronary  arteries again noted. There are prominent calcifications of the aortic valve. The heart size is normal. No significant pericardial fluid. Mediastinum/Nodes: There are no enlarged mediastinal, hilar or axillary lymph nodes.Unchanged small mediastinal lymph nodes. The thyroid  gland, trachea and esophagus demonstrate no significant findings. Lungs/Pleura: No pleural effusion or pneumothorax. Previously demonstrated right pleural effusion has resolved with improving aeration of the right lung base. Moderate centrilobular and paraseptal emphysema with diffuse central airway thickening and mucous plugging at both lung bases. Mild linear atelectasis in the lingula. Solid left lower lobe pulmonary nodule measures 1.7 x 1.3 cm on image 73/5 (1.6 x 1.4 cm on 12/05/2023). This measures up to 1.8 cm on sagittal image 125/7 (previously 1.7 cm). No new or other enlarging nodules identified. Upper abdomen: The visualized upper abdomen appears stable, without significant findings. Musculoskeletal/Chest wall: There is no chest wall mass or suspicious osseous finding. Healing rib fractures on the right. Mild multilevel spondylosis. IMPRESSION: 1. Stable size of solid left lower lobe pulmonary nodule, without significant metabolic activity on PET-CT. Despite slow growth from older prior studies, this is likely a benign finding based on recent stability and negative cytology on 04/07/2024. Consider continued surveillance in 6-12 months. 2. No new or enlarging pulmonary nodules. 3. No acute chest findings. 4. Aortic Atherosclerosis (ICD10-I70.0) and Emphysema (ICD10-J43.9). Electronically Signed   By: Elsie Perone M.D.   On: 05/02/2024 14:48   DG Chest 1 View Result Date: 05/02/2024 EXAM: 1 VIEW(S) XRAY OF THE CHEST 05/02/2024 11:43:00 AM COMPARISON: 02/21/2024 CLINICAL HISTORY: Dyspnea ; Per note:; Pt here POV with reports of dark stool and some BRB in stool for the last several weeks. Currently taking clopidigrel instead of her  eliquis  due to the bleeding. Endorses some shob and intermittent chest pain. Recent placement of stent in L carotid FINDINGS: LUNGS AND PLEURA: Ill-defined opacity at left lung base with possible small left effusion. Nodular density in left mid lung, 18 mm. No pulmonary edema. No pneumothorax. HEART AND MEDIASTINUM: Mild cardiomegaly, stable. Aortic atherosclerosis. BONES AND SOFT TISSUES: Multiple remote right rib fractures. IMPRESSION: 1. Ill-defined opacity at the left lung base with possible small left pleural effusion. 2. 18 mm nodular density in the left mid lung; recommend dedicated chest CT for further characterization. Electronically signed by: Birmingham Calk MD 05/02/2024 12:04 PM EDT RP Workstation: [id number]     Nilda Fendt, MD, PhD [facility]  Between 7 am - 7 pm I am available, please contact me via Amion (for emergencies) or Securechat (non urgent messages)  Between 7 pm - 7 am I am not available, please contact night coverage MD/APP via Amion

## 2024-05-03 NOTE — Plan of Care (Signed)
   Problem: Education: Goal: Knowledge of General Education information will improve Description: Including pain rating scale, medication(s)/side effects and non-pharmacologic comfort measures Outcome: Progressing   Problem: Health Behavior/Discharge Planning: Goal: Ability to manage health-related needs will improve Outcome: Progressing   Problem: Clinical Measurements: Goal: Will remain free from infection Outcome: Progressing

## 2024-05-03 NOTE — Progress Notes (Signed)
 Patient ID: Denise [name], female   DOB: 1960/10/18, 63 y.o.   MRN: [id number]    Progress Note   Subjective   Day # 2 CC; rectal bleeding, anemia  Plavix  on hold last dose yesterday Patient also had single dose of Eliquis  on Thursday, 04/30/2024  Receiving IV iron infusion  No further active bleeding since admission.  Patient says she feels better post transfusions shortness of breath much improved, no chest pain.  She has been eating She is very anxious about her medications and whether or not she is being given the appropriate medications as she says she was taking several more pills at home than what she is being given here.  Family at bedside today  Labs today-hemoglobin 10 up from hemoglobin of 8 yesterday Sodium 133/potassium 3.7/BUN less than 5/creatinine 0.59   Objective   Vital signs in last 24 hours: Temp:  [98.2 F (36.8 C)-99.1 F (37.3 C)] 98.6 F (37 C) (09/28 0916) Pulse Rate:  [66-78] 70 (09/28 0916) Resp:  [18] 18 (09/28 0916) BP: (108-139)/(45-72) 121/60 (09/28 0916) SpO2:  [90 %-99 %] 93 % (09/28 0916) Last BM Date : 05/02/24 General:   Older white female in NAD, talkative and anxious very pleasant Heart:  Regular rate and rhythm; no murmurs Lungs: Respirations even some increased work of breathing and expiratory wheeze Abdomen:  Soft, nontender and nondistended. Normal bowel sounds. Extremities:  Without edema. Neurologic:  Alert and oriented,  grossly normal neurologically. Psych:  Cooperative. Normal mood and affect.  Intake/Output from previous day: No intake/output data recorded. Intake/Output this shift: Total I/O In: 592 [P.O.:120; Blood:472] Out: -   Lab Results: Recent Labs    05/02/24 1126 05/02/24 1139 05/02/24 1908 05/03/24 0008 05/03/24 1230  WBC 9.0  --   --  7.5  --   HGB 8.0*   < > 8.1* 7.6* 10.0*  HCT 26.2*   < > 25.0* 24.2* 30.1*  PLT 461*  --   --  363  --    < > = values in this interval not displayed.    BMET Recent Labs    05/02/24 1126 05/02/24 1139 05/03/24 0008  NA 127* 129* 133*  K 3.7 4.0 3.7  CL 95* 96* 100  CO2 22  --  24  GLUCOSE 89 89 102*  BUN 8 7* <5*  CREATININE 0.69 0.80 0.59  CALCIUM  8.3*  --  8.4*   LFT Recent Labs    05/02/24 1126  PROT 6.4*  ALBUMIN  3.2*  AST 37  ALT 32  ALKPHOS 82  BILITOT 0.5   PT/INR Recent Labs    05/02/24 1126  LABPROT 14.0  INR 1.0    Studies/Results: CT Angio Abd/Pel W and/or Wo Contrast Result Date: 05/02/2024 CLINICAL DATA:  Lower GI bleed. EXAM: CTA ABDOMEN AND PELVIS WITHOUT AND WITH CONTRAST TECHNIQUE: Multidetector CT imaging of the abdomen and pelvis was performed using the standard protocol during bolus administration of intravenous contrast. Multiplanar reconstructed images and MIPs were obtained and reviewed to evaluate the vascular anatomy. RADIATION DOSE REDUCTION: This exam was performed according to the departmental dose-optimization program which includes automated exposure control, adjustment of the mA and/or kV according to patient size and/or use of iterative reconstruction technique. CONTRAST:  75mL OMNIPAQUE  IOHEXOL  350 MG/ML SOLN COMPARISON:  10/26/2023. FINDINGS: VASCULAR Aorta: Normal caliber aorta without aneurysm, dissection, vasculitis or significant stenosis. Aortic atherosclerosis with mural thrombus. Celiac: Patent without evidence of aneurysm, dissection, vasculitis or significant stenosis. SMA: Patent without  evidence of aneurysm, dissection, vasculitis or significant stenosis. Renals: Both renal arteries are patent without evidence of aneurysm, dissection, vasculitis, fibromuscular dysplasia. Moderate renal artery stenosis is present bilaterally at the ostia. IMA: Patent. Inflow: Heavy atherosclerotic calcification with severe stenosis involving the common iliac artery on the right. There is moderate stenosis involving the common iliac artery on the left. The external and internal iliac arteries are  patent bilaterally. Proximal Outflow: Bilateral common femoral and visualized portions of the superficial and profunda femoral arteries are patent without evidence of aneurysm, dissection, vasculitis or significant stenosis. Veins: No obvious venous abnormality within the limitations of this arterial phase study. Review of the MIP images confirms the above findings. NON-VASCULAR Lower chest: The heart is enlarged and there is a trace pericardial effusion. Multi-vessel coronary artery calcifications are noted. Emphysematous changes are present in the lungs. Airspace opacities are noted at the right lung base, possible atelectasis or infiltrate. Atelectasis is present at the left lung base. No pleural effusion is seen. Hepatobiliary: No focal liver abnormality is seen. Status post cholecystectomy. No biliary dilatation. Pancreas: Unremarkable. No pancreatic ductal dilatation or surrounding inflammatory changes. Spleen: Normal in size without focal abnormality. Adrenals/Urinary Tract: There is low attenuation thickening of the left adrenal gland, possible hyperplasia. The right adrenal gland is within normal limits. No renal calculus or hydronephrosis bilaterally. The bladder is unremarkable. Stomach/Bowel: The stomach is within normal limits. No bowel obstruction, free air, or pneumatosis is seen. Appendix appears normal. No acute or active hemorrhage is identified. Lymphatic: No abdominal or pelvic lymphadenopathy. Reproductive: The uterus and right ovary are within normal limits. There is persistent asymmetric enlargement of the left ovary measuring 3.8 cm, unchanged from multiple prior exams. Other: No abdominopelvic ascites. A small fat containing umbilical hernia is present. Musculoskeletal: Degenerative changes are noted in the thoracolumbar spine. Progression of a compression deformity in the superior endplate at L4 is noted. There is a healing fracture forming of the L4 transverse process on the right.  IMPRESSION: VASCULAR 1. No evidence of active hemorrhage. 2. Aortic atherosclerosis. 3. Extensive atherosclerotic calcification of the common iliac arteries bilaterally with moderate stenosis on the left and severe stenosis on the right. NON-VASCULAR 1. No evidence of acute or active hemorrhage. 2. Patchy airspace disease at the right lung base, possible residual infiltrate. 3. Interval progression of L4 superior endplate compression deformity. 4. Cardiomegaly with multi-vessel coronary artery calcifications. 5. Emphysema. Electronically Signed   By: Leita Birmingham M.D.   On: 05/02/2024 14:57   CT Chest W Contrast Result Date: 05/02/2024 CLINICAL DATA:  Follow up pulmonary nodule. EXAM: CT CHEST WITH CONTRAST TECHNIQUE: Multidetector CT imaging of the chest was performed during intravenous contrast administration. RADIATION DOSE REDUCTION: This exam was performed according to the departmental dose-optimization program which includes automated exposure control, adjustment of the mA and/or kV according to patient size and/or use of iterative reconstruction technique. CONTRAST:  75mL OMNIPAQUE  IOHEXOL  350 MG/ML SOLN COMPARISON:  Chest CT 12/05/2023, 10/26/2023 and 04/24/2021. PET-CT 03/30/2024 and 06/21/2021. FINDINGS: Cardiovascular: No acute vascular findings are demonstrated. Diffuse atherosclerosis of the aorta, great vessels and coronary arteries again noted. There are prominent calcifications of the aortic valve. The heart size is normal. No significant pericardial fluid. Mediastinum/Nodes: There are no enlarged mediastinal, hilar or axillary lymph nodes.Unchanged small mediastinal lymph nodes. The thyroid  gland, trachea and esophagus demonstrate no significant findings. Lungs/Pleura: No pleural effusion or pneumothorax. Previously demonstrated right pleural effusion has resolved with improving aeration of the right lung base. Moderate  centrilobular and paraseptal emphysema with diffuse central airway  thickening and mucous plugging at both lung bases. Mild linear atelectasis in the lingula. Solid left lower lobe pulmonary nodule measures 1.7 x 1.3 cm on image 73/5 (1.6 x 1.4 cm on 12/05/2023). This measures up to 1.8 cm on sagittal image 125/7 (previously 1.7 cm). No new or other enlarging nodules identified. Upper abdomen: The visualized upper abdomen appears stable, without significant findings. Musculoskeletal/Chest wall: There is no chest wall mass or suspicious osseous finding. Healing rib fractures on the right. Mild multilevel spondylosis. IMPRESSION: 1. Stable size of solid left lower lobe pulmonary nodule, without significant metabolic activity on PET-CT. Despite slow growth from older prior studies, this is likely a benign finding based on recent stability and negative cytology on 04/07/2024. Consider continued surveillance in 6-12 months. 2. No new or enlarging pulmonary nodules. 3. No acute chest findings. 4. Aortic Atherosclerosis (ICD10-I70.0) and Emphysema (ICD10-J43.9). Electronically Signed   By: Elsie Perone M.D.   On: 05/02/2024 14:48   DG Chest 1 View Result Date: 05/02/2024 EXAM: 1 VIEW(S) XRAY OF THE CHEST 05/02/2024 11:43:00 AM COMPARISON: 02/21/2024 CLINICAL HISTORY: Dyspnea ; Per note:; Pt here POV with reports of dark stool and some BRB in stool for the last several weeks. Currently taking clopidigrel instead of her eliquis  due to the bleeding. Endorses some shob and intermittent chest pain. Recent placement of stent in L carotid FINDINGS: LUNGS AND PLEURA: Ill-defined opacity at left lung base with possible small left effusion. Nodular density in left mid lung, 18 mm. No pulmonary edema. No pneumothorax. HEART AND MEDIASTINUM: Mild cardiomegaly, stable. Aortic atherosclerosis. BONES AND SOFT TISSUES: Multiple remote right rib fractures. IMPRESSION: 1. Ill-defined opacity at the left lung base with possible small left pleural effusion. 2. 18 mm nodular density in the left mid lung;  recommend dedicated chest CT for further characterization. Electronically signed by: Waddell Calk MD 05/02/2024 12:04 PM EDT RP Workstation: [id number]       Assessment / Plan:    [age] year old white female who presented with dark stools over the past couple of months perhaps more notable in the past couple of weeks, had 1 episode of dark red blood/bowel movement on Thursday, 04/30/2024 and then a melenic stool on 05/01/2024. It is in the setting of chronic Plavix  use and had tried to switch over to Eliquis  on Thursday but then went back to Plavix  because of the bleeding episode.  She had had a 6 g drop in hemoglobin and over about 3 weeks. She received blood transfusion x 2 yesterday with hemoglobin up to 10 today  He has been hemodynamically stable, no further active bleeding Plavix  on hold but did have a dose yesterday morning  She has previously documented medium sized cecal AVM at colonoscopy in 2021 which was treated with APC.  Etiology of your current bleeding and anemia is not entirely clear may have recurrent colonic or other intestinal AVMs, rule out other mucosal lesions.  #2 anemia/normocytic/iron deficient secondary to above #3 history of atrial fib/status post Watchman #4 status post recent left carotid artery revascularization 02/12/2024  #5 chronic anxiety #6.  COPD  Plan; continue regular diet Continue serial hemoglobins and transfuse as indicated Continue to hold Plavix  Would anticipate bowel prep tomorrow and colonoscopy and EGD on Tuesday would allow for at least a 3-day washout. GI will continue to follow with you      Principal Problem:   Acute lower GI bleeding Active Problems:   Essential hypertension  COPD (chronic obstructive pulmonary disease) (HCC)   AVM (arteriovenous malformation) of colon   Hyperlipidemia     LOS: 1 day   Lulla Linville EsterwoodPA-C  05/03/2024, 4:21 PM

## 2024-05-04 DIAGNOSIS — K922 Gastrointestinal hemorrhage, unspecified: Secondary | ICD-10-CM | POA: Diagnosis not present

## 2024-05-04 DIAGNOSIS — D649 Anemia, unspecified: Secondary | ICD-10-CM | POA: Diagnosis not present

## 2024-05-04 DIAGNOSIS — D509 Iron deficiency anemia, unspecified: Secondary | ICD-10-CM

## 2024-05-04 DIAGNOSIS — Z7902 Long term (current) use of antithrombotics/antiplatelets: Secondary | ICD-10-CM | POA: Diagnosis not present

## 2024-05-04 DIAGNOSIS — Z7901 Long term (current) use of anticoagulants: Secondary | ICD-10-CM

## 2024-05-04 LAB — BPAM RBC
Blood Product Expiration Date: 202510212359
ISSUE DATE / TIME: 202509280120
ISSUE DATE / TIME: 202509280500
ISSUE DATE / TIME: 202510052359
Unit Type and Rh: 202510052359
Unit Type and Rh: 202510212359
Unit Type and Rh: 5100
Unit Type and Rh: 5100

## 2024-05-04 LAB — HEMOGLOBIN AND HEMATOCRIT, BLOOD
HCT: 29.3 % — ABNORMAL LOW (ref 36.0–46.0)
HCT: 32.3 % — ABNORMAL LOW (ref 36.0–46.0)
HCT: 31.5 % — ABNORMAL LOW (ref 36.0–46.0)
Hemoglobin: 9.6 g/dL — ABNORMAL LOW (ref 12.0–15.0)
Hemoglobin: 10.5 g/dL — ABNORMAL LOW (ref 12.0–15.0)
Hemoglobin: 10.2 g/dL — ABNORMAL LOW (ref 12.0–15.0)

## 2024-05-04 LAB — COMPREHENSIVE METABOLIC PANEL WITH GFR
ALT: 28 U/L (ref 0–44)
AST: 28 U/L (ref 15–41)
Albumin: 3.3 g/dL — ABNORMAL LOW (ref 3.5–5.0)
Alkaline Phosphatase: 81 U/L (ref 38–126)
Anion gap: 10 (ref 5–15)
BUN: <5 mg/dL — ABNORMAL LOW (ref 8–23)
CO2: 25 mmol/L (ref 22–32)
Calcium: 8.8 mg/dL — ABNORMAL LOW (ref 8.9–10.3)
Chloride: 99 mmol/L (ref 98–111)
Creatinine, Ser: 0.59 mg/dL (ref 0.44–1.00)
GFR, Estimated: >60 mL/min (ref >60)
Glucose, Bld: 95 mg/dL (ref 70–99)
Potassium: 3.6 mmol/L (ref 3.5–5.1)
Sodium: 134 mmol/L — ABNORMAL LOW (ref 135–145)
Total Bilirubin: 0.7 mg/dL (ref 0.0–1.2)
Total Protein: 6.6 g/dL (ref 6.5–8.1)

## 2024-05-04 LAB — CBC
HCT: 32.3 % — ABNORMAL LOW (ref 36.0–46.0)
Hemoglobin: 10.5 g/dL — ABNORMAL LOW (ref 12.0–15.0)
MCH: 28.9 pg (ref 26.0–34.0)
MCHC: 32.5 g/dL (ref 30.0–36.0)
MCV: 89 fL (ref 80.0–100.0)
Platelets: 392 K/uL (ref 150–400)
RBC: 3.63 MIL/uL — ABNORMAL LOW (ref 3.87–5.11)
RDW: 16.8 % — ABNORMAL HIGH (ref 11.5–15.5)
WBC: 6.7 K/uL (ref 4.0–10.5)
nRBC: 0.6 % — ABNORMAL HIGH (ref 0.0–0.2)

## 2024-05-04 LAB — TYPE AND SCREEN
ABO/RH(D): O POS
Antibody Screen: NEGATIVE
Unit division: 0
Unit division: 0

## 2024-05-04 LAB — MAGNESIUM: Magnesium: 2.2 mg/dL (ref 1.7–2.4)

## 2024-05-04 MED ORDER — NA SULFATE-K SULFATE-MG SULF 17.5-3.13-1.6 GM/177ML PO SOLN
0.5000 | Freq: Once | ORAL | Status: AC
Start: 2024-05-04 — End: 2024-05-04
  Administered 2024-05-04: 177 mL via ORAL

## 2024-05-04 MED ORDER — NA SULFATE-K SULFATE-MG SULF 17.5-3.13-1.6 GM/177ML PO SOLN
0.5000 | Freq: Once | ORAL | Status: AC
  Administered 2024-05-04: 177 mL via ORAL
  Filled 2024-05-04: qty 1

## 2024-05-04 MED ORDER — SODIUM CHLORIDE 0.9 % IV SOLN
INTRAVENOUS | Status: DC
Start: 2024-05-04 — End: 2024-05-05

## 2024-05-04 MED ORDER — SIMETHICONE 80 MG PO CHEW
240.0000 mg | CHEWABLE_TABLET | Freq: Once | ORAL | Status: AC
  Administered 2024-05-04: 240 mg via ORAL

## 2024-05-04 MED ORDER — SIMETHICONE 80 MG PO CHEW
240.0000 mg | CHEWABLE_TABLET | Freq: Once | ORAL | Status: DC
Start: 2024-05-04 — End: 2024-05-05
  Filled 2024-05-04: qty 3

## 2024-05-04 MED ORDER — METOPROLOL TARTRATE 25 MG PO TABS
25.0000 mg | ORAL_TABLET | Freq: Two times a day (BID) | ORAL | Status: DC
  Administered 2024-05-04 – 2024-05-05 (×3): 25 mg via ORAL
  Filled 2024-05-04 (×3): qty 1

## 2024-05-04 NOTE — H&P (View-Only) (Signed)
 Daily Progress Note  DOA: 05/02/2024 Hospital Day: 3  Cc:  GI bleed( dark red stool)  and anemia  ASSESSMENT    63 yo female with a history of iron deficiency anemia and cecal AVMs ( 2021) currently admitted with GI bleeding ( dark stools / red blood) and recurrent iron deficiency anemia . Bleeding started as she was in process of discontinuing Plavix  and initiating Eliquis  + baby asa Hgb 8, down from baseline 13.7. Ferritin 11. CT angio negative for active bleeding. We have been awaiting Plavix  washout to proceed with endoscopic evaluation  TODAY:  Hgb 10.5 post 2 u RBCs and dose of IV iron yesterday. No further GI bleeding  Atrial fibrillation Had just started taking Eliquis  prior to admission  Carotid artery disease Post transcarotid artery revascularization ( TCAR) 02/12/24 for high grade left internal carotid stenosis > 80 %  Severe aortic stenosis  COPD Stable lung nodule No coughing / wheezing on exam. Chest CT scan without acute findings, stable size of solid LLL nodule. Likely a benign finding based on recent stability and negative cytology on 04/07/2024.   Hoarse voice Scheduled to see Dr. Suzann in our office in November   Principal Problem:   Acute lower GI bleeding Active Problems:   Essential hypertension   COPD (chronic obstructive pulmonary disease) (HCC)   AVM (arteriovenous malformation) of colon   Hyperlipidemia   Platelet inhibition due to Plavix     PLAN   --Schedule for EGD and colonoscopy tomorrow. The risks and benefits of EGD with possible biopsies and colonoscopy with possible biopsies and removal of polyps were discussed with the patient who agrees to proceed. --Continue daily Pantoprazole    Subjective   No physical complaints. No GI bleeding today   Objective   GI Studies:    Recent Labs    05/02/24 1126 05/02/24 1139 05/03/24 0008 05/03/24 1230 05/03/24 1719 05/04/24 0047 05/04/24 0745  WBC 9.0  --  7.5  --   --   --   6.7  HGB 8.0*   < > 7.6*   < > 9.9* 9.6* 10.5*  10.5*  HCT 26.2*   < > 24.2*   < > 30.6* 29.3* 32.3*  32.3*  MCV 93.9  --  90.6  --   --   --  89.0  PLT 461*  --  363  --   --   --  392   < > = values in this interval not displayed.   Recent Labs    05/02/24 1908  FERRITIN 11  TIBC 435  IRONPCTSAT NOT CALCULATED   Recent Labs    05/02/24 1126 05/02/24 1139 05/03/24 0008 05/04/24 0745  NA 127* 129* 133* 134*  K 3.7 4.0 3.7 3.6  CL 95* 96* 100 99  CO2 22  --  24 25  GLUCOSE 89 89 102* 95  BUN 8 7* <5* <5*  CREATININE 0.69 0.80 0.59 0.59  CALCIUM  8.3*  --  8.4* 8.8*   Recent Labs    05/02/24 1126 05/04/24 0745  PROT 6.4* PENDING  ALBUMIN  3.2* 3.3*  AST 37 28  ALT 32 28  ALKPHOS 82 81  BILITOT 0.5 0.7    Imaging:  CT Angio Abd/Pel W and/or Wo Contrast CLINICAL DATA:  Lower GI bleed.  EXAM: CTA ABDOMEN AND PELVIS WITHOUT AND WITH CONTRAST  TECHNIQUE: Multidetector CT imaging of the abdomen and pelvis was performed using the standard protocol during bolus administration of intravenous contrast. Multiplanar reconstructed  images and MIPs were obtained and reviewed to evaluate the vascular anatomy.  RADIATION DOSE REDUCTION: This exam was performed according to the departmental dose-optimization program which includes automated exposure control, adjustment of the mA and/or kV according to patient size and/or use of iterative reconstruction technique.  CONTRAST:  75mL OMNIPAQUE  IOHEXOL  350 MG/ML SOLN  COMPARISON:  10/26/2023.  FINDINGS: VASCULAR  Aorta: Normal caliber aorta without aneurysm, dissection, vasculitis or significant stenosis. Aortic atherosclerosis with mural thrombus.  Celiac: Patent without evidence of aneurysm, dissection, vasculitis or significant stenosis.  SMA: Patent without evidence of aneurysm, dissection, vasculitis or significant stenosis.  Renals: Both renal arteries are patent without evidence of aneurysm, dissection,  vasculitis, fibromuscular dysplasia. Moderate renal artery stenosis is present bilaterally at the ostia.  IMA: Patent.  Inflow: Heavy atherosclerotic calcification with severe stenosis involving the common iliac artery on the right. There is moderate stenosis involving the common iliac artery on the left. The external and internal iliac arteries are patent bilaterally.  Proximal Outflow: Bilateral common femoral and visualized portions of the superficial and profunda femoral arteries are patent without evidence of aneurysm, dissection, vasculitis or significant stenosis.  Veins: No obvious venous abnormality within the limitations of this arterial phase study.  Review of the MIP images confirms the above findings.  NON-VASCULAR  Lower chest: The heart is enlarged and there is a trace pericardial effusion. Multi-vessel coronary artery calcifications are noted. Emphysematous changes are present in the lungs. Airspace opacities are noted at the right lung base, possible atelectasis or infiltrate. Atelectasis is present at the left lung base. No pleural effusion is seen.  Hepatobiliary: No focal liver abnormality is seen. Status post cholecystectomy. No biliary dilatation.  Pancreas: Unremarkable. No pancreatic ductal dilatation or surrounding inflammatory changes.  Spleen: Normal in size without focal abnormality.  Adrenals/Urinary Tract: There is low attenuation thickening of the left adrenal gland, possible hyperplasia. The right adrenal gland is within normal limits. No renal calculus or hydronephrosis bilaterally. The bladder is unremarkable.  Stomach/Bowel: The stomach is within normal limits. No bowel obstruction, free air, or pneumatosis is seen. Appendix appears normal. No acute or active hemorrhage is identified.  Lymphatic: No abdominal or pelvic lymphadenopathy.  Reproductive: The uterus and right ovary are within normal limits. There is persistent asymmetric  enlargement of the left ovary measuring 3.8 cm, unchanged from multiple prior exams.  Other: No abdominopelvic ascites. A small fat containing umbilical hernia is present.  Musculoskeletal: Degenerative changes are noted in the thoracolumbar spine. Progression of a compression deformity in the superior endplate at L4 is noted. There is a healing fracture forming of the L4 transverse process on the right.  IMPRESSION: VASCULAR  1. No evidence of active hemorrhage. 2. Aortic atherosclerosis. 3. Extensive atherosclerotic calcification of the common iliac arteries bilaterally with moderate stenosis on the left and severe stenosis on the right.  NON-VASCULAR  1. No evidence of acute or active hemorrhage. 2. Patchy airspace disease at the right lung base, possible residual infiltrate. 3. Interval progression of L4 superior endplate compression deformity. 4. Cardiomegaly with multi-vessel coronary artery calcifications. 5. Emphysema.  Electronically Signed   By: Leita Birmingham M.D.   On: 05/02/2024 14:57 CT Chest W Contrast CLINICAL DATA:  Follow up pulmonary nodule.  EXAM: CT CHEST WITH CONTRAST  TECHNIQUE: Multidetector CT imaging of the chest was performed during intravenous contrast administration.  RADIATION DOSE REDUCTION: This exam was performed according to the departmental dose-optimization program which includes automated exposure control, adjustment of the mA  and/or kV according to patient size and/or use of iterative reconstruction technique.  CONTRAST:  75mL OMNIPAQUE  IOHEXOL  350 MG/ML SOLN  COMPARISON:  Chest CT 12/05/2023, 10/26/2023 and 04/24/2021. PET-CT 03/30/2024 and 06/21/2021.  FINDINGS: Cardiovascular: No acute vascular findings are demonstrated. Diffuse atherosclerosis of the aorta, great vessels and coronary arteries again noted. There are prominent calcifications of the aortic valve. The heart size is normal. No significant pericardial  fluid.  Mediastinum/Nodes: There are no enlarged mediastinal, hilar or axillary lymph nodes.Unchanged small mediastinal lymph nodes. The thyroid  gland, trachea and esophagus demonstrate no significant findings.  Lungs/Pleura: No pleural effusion or pneumothorax. Previously demonstrated right pleural effusion has resolved with improving aeration of the right lung base. Moderate centrilobular and paraseptal emphysema with diffuse central airway thickening and mucous plugging at both lung bases. Mild linear atelectasis in the lingula. Solid left lower lobe pulmonary nodule measures 1.7 x 1.3 cm on image 73/5 (1.6 x 1.4 cm on 12/05/2023). This measures up to 1.8 cm on sagittal image 125/7 (previously 1.7 cm). No new or other enlarging nodules identified.  Upper abdomen: The visualized upper abdomen appears stable, without significant findings.  Musculoskeletal/Chest wall: There is no chest wall mass or suspicious osseous finding. Healing rib fractures on the right. Mild multilevel spondylosis.  IMPRESSION: 1. Stable size of solid left lower lobe pulmonary nodule, without significant metabolic activity on PET-CT. Despite slow growth from older prior studies, this is likely a benign finding based on recent stability and negative cytology on 04/07/2024. Consider continued surveillance in 6-12 months. 2. No new or enlarging pulmonary nodules. 3. No acute chest findings. 4. Aortic Atherosclerosis (ICD10-I70.0) and Emphysema (ICD10-J43.9).  Electronically Signed   By: Elsie Perone M.D.   On: 05/02/2024 14:48 DG Chest 1 View EXAM: 1 VIEW(S) XRAY OF THE CHEST 05/02/2024 11:43:00 AM  COMPARISON: 02/21/2024  CLINICAL HISTORY: Dyspnea ; Per note:; Pt here POV with reports of dark stool and some BRB in stool for the last several weeks. Currently taking clopidigrel instead of her eliquis  due to the bleeding. Endorses some shob and intermittent chest pain. Recent placement of stent  in L carotid  FINDINGS:  LUNGS AND PLEURA: Ill-defined opacity at left lung base with possible small left effusion. Nodular density in left mid lung, 18 mm. No pulmonary edema. No pneumothorax.  HEART AND MEDIASTINUM: Mild cardiomegaly, stable. Aortic atherosclerosis.  BONES AND SOFT TISSUES: Multiple remote right rib fractures.  IMPRESSION: 1. Ill-defined opacity at the left lung base with possible small left pleural effusion. 2. 18 mm nodular density in the left mid lung; recommend dedicated chest CT for further characterization.  Electronically signed by: Taylor Stroud MD 05/02/2024 12:04 PM EDT RP Workstation: [id number]     Scheduled inpatient medications:   sodium chloride    Intravenous Once   amiodarone   200 mg Oral Daily   atorvastatin   80 mg Oral Daily   busPIRone   10 mg Oral BID   escitalopram   20 mg Oral Daily   fluticasone  furoate-vilanterol  1 puff Inhalation Daily   metoprolol  tartrate  25 mg Oral BID   pantoprazole   40 mg Oral Daily   umeclidinium bromide   1 puff Inhalation Daily   Continuous inpatient infusions:  PRN inpatient medications: acetaminophen  **OR** acetaminophen , albuterol , ALPRAZolam   Vital signs in last 24 hours: Temp:  [97.8 F (36.6 C)-98.6 F (37 C)] 97.8 F (36.6 C) (09/29 0838) Pulse Rate:  [81-87] 87 (09/29 1131) Resp:  [15-18] 18 (09/29 0838) BP: (125-157)/(65-78) 125/70 (09/29 1131) SpO2:  [  91 %-97 %] 96 % (09/29 1130) Last BM Date : 05/04/24  Intake/Output Summary (Last 24 hours) at 05/04/2024 1144 Last data filed at 05/04/2024 0900 Gross per 24 hour  Intake 360 ml  Output --  Net 360 ml    Intake/Output from previous day: 09/28 0701 - 09/29 0700 In: 592 [P.O.:120; Blood:472] Out: -  Intake/Output this shift: Total I/O In: 360 [P.O.:360] Out: -    Physical Exam:  General: Alert female in NAD Heart:  Regular rate .  Pulmonary: Normal respiratory effort Abdomen: Soft, nondistended, nontender. Normal bowel  sounds. Extremities: No lower extremity edema  Neurologic: Alert and oriented Psych: Pleasant. Cooperative     LOS: 2 days   Vina Dasen ,NP 05/04/2024, 11:44 AM    Attending physician's note   I have taken a history, reviewed the chart, and examined the patient. I performed a substantive portion of this encounter, including complete performance of at least one of the key components, in conjunction with the APP. I agree with the APP's note, impression, and recommendations with my edits.  No acute events overnight.  BM earlier today and she thought maybe there was some slight blood mixed around the stool.  Tolerating liquids without issue.  Good response to 2 unit RBC transfusion with H/H 10.5/32 today, but Hgb overall down from baseline ~13.  Received IV iron.  We had an extended conversation today regarding her suspected GI bleed to include endoscopic diagnostic and therapeutic capabilities with plan for the following:  - Clears today, bowel prep this evening - N.p.o. at midnight aside from prep and medications - EGD/colonoscopy tomorrow for diagnostic and therapeutic intent - Holding Eliquis  and Plavix   The indications, risks, and benefits of EGD and colonoscopy were explained to the patient in detail. Risks include but are not limited to bleeding, perforation, adverse reaction to medications, and cardiopulmonary compromise. Sequelae include but are not limited to the possibility of surgery, hospitalization, and mortality.  We specifically discussed her increased risks with endoscopic procedures and sedation due to underlying comorbidities, to include A-fib, COPD, carotid stenosis.  The patient verbalized understanding and wished to proceed. All questions answered.    [address] DO, FACG [phone] office

## 2024-05-04 NOTE — Progress Notes (Addendum)
 Daily Progress Note  DOA: 05/02/2024 Hospital Day: 3  Cc:  GI bleed( dark red stool)  and anemia  ASSESSMENT    63 yo female with a history of iron deficiency anemia and cecal AVMs ( 2021) currently admitted with GI bleeding ( dark stools / red blood) and recurrent iron deficiency anemia . Bleeding started as she was in process of discontinuing Plavix  and initiating Eliquis  + baby asa Hgb 8, down from baseline 13.7. Ferritin 11. CT angio negative for active bleeding. We have been awaiting Plavix  washout to proceed with endoscopic evaluation  TODAY:  Hgb 10.5 post 2 u RBCs and dose of IV iron yesterday. No further GI bleeding  Atrial fibrillation Had just started taking Eliquis  prior to admission  Carotid artery disease Post transcarotid artery revascularization ( TCAR) 02/12/24 for high grade left internal carotid stenosis > 80 %  Severe aortic stenosis  COPD Stable lung nodule No coughing / wheezing on exam. Chest CT scan without acute findings, stable size of solid LLL nodule. Likely a benign finding based on recent stability and negative cytology on 04/07/2024.   Hoarse voice Scheduled to see Dr. Suzann in our office in November   Principal Problem:   Acute lower GI bleeding Active Problems:   Essential hypertension   COPD (chronic obstructive pulmonary disease) (HCC)   AVM (arteriovenous malformation) of colon   Hyperlipidemia   Platelet inhibition due to Plavix     PLAN   --Schedule for EGD and colonoscopy tomorrow. The risks and benefits of EGD with possible biopsies and colonoscopy with possible biopsies and removal of polyps were discussed with the patient who agrees to proceed. --Continue daily Pantoprazole    Subjective   No physical complaints. No GI bleeding today   Objective   GI Studies:    Recent Labs    05/02/24 1126 05/02/24 1139 05/03/24 0008 05/03/24 1230 05/03/24 1719 05/04/24 0047 05/04/24 0745  WBC 9.0  --  7.5  --   --   --   6.7  HGB 8.0*   < > 7.6*   < > 9.9* 9.6* 10.5*  10.5*  HCT 26.2*   < > 24.2*   < > 30.6* 29.3* 32.3*  32.3*  MCV 93.9  --  90.6  --   --   --  89.0  PLT 461*  --  363  --   --   --  392   < > = values in this interval not displayed.   Recent Labs    05/02/24 1908  FERRITIN 11  TIBC 435  IRONPCTSAT NOT CALCULATED   Recent Labs    05/02/24 1126 05/02/24 1139 05/03/24 0008 05/04/24 0745  NA 127* 129* 133* 134*  K 3.7 4.0 3.7 3.6  CL 95* 96* 100 99  CO2 22  --  24 25  GLUCOSE 89 89 102* 95  BUN 8 7* <5* <5*  CREATININE 0.69 0.80 0.59 0.59  CALCIUM  8.3*  --  8.4* 8.8*   Recent Labs    05/02/24 1126 05/04/24 0745  PROT 6.4* PENDING  ALBUMIN  3.2* 3.3*  AST 37 28  ALT 32 28  ALKPHOS 82 81  BILITOT 0.5 0.7    Imaging:  CT Angio Abd/Pel W and/or Wo Contrast CLINICAL DATA:  Lower GI bleed.  EXAM: CTA ABDOMEN AND PELVIS WITHOUT AND WITH CONTRAST  TECHNIQUE: Multidetector CT imaging of the abdomen and pelvis was performed using the standard protocol during bolus administration of intravenous contrast. Multiplanar reconstructed  images and MIPs were obtained and reviewed to evaluate the vascular anatomy.  RADIATION DOSE REDUCTION: This exam was performed according to the departmental dose-optimization program which includes automated exposure control, adjustment of the mA and/or kV according to patient size and/or use of iterative reconstruction technique.  CONTRAST:  75mL OMNIPAQUE  IOHEXOL  350 MG/ML SOLN  COMPARISON:  10/26/2023.  FINDINGS: VASCULAR  Aorta: Normal caliber aorta without aneurysm, dissection, vasculitis or significant stenosis. Aortic atherosclerosis with mural thrombus.  Celiac: Patent without evidence of aneurysm, dissection, vasculitis or significant stenosis.  SMA: Patent without evidence of aneurysm, dissection, vasculitis or significant stenosis.  Renals: Both renal arteries are patent without evidence of aneurysm, dissection,  vasculitis, fibromuscular dysplasia. Moderate renal artery stenosis is present bilaterally at the ostia.  IMA: Patent.  Inflow: Heavy atherosclerotic calcification with severe stenosis involving the common iliac artery on the right. There is moderate stenosis involving the common iliac artery on the left. The external and internal iliac arteries are patent bilaterally.  Proximal Outflow: Bilateral common femoral and visualized portions of the superficial and profunda femoral arteries are patent without evidence of aneurysm, dissection, vasculitis or significant stenosis.  Veins: No obvious venous abnormality within the limitations of this arterial phase study.  Review of the MIP images confirms the above findings.  NON-VASCULAR  Lower chest: The heart is enlarged and there is a trace pericardial effusion. Multi-vessel coronary artery calcifications are noted. Emphysematous changes are present in the lungs. Airspace opacities are noted at the right lung base, possible atelectasis or infiltrate. Atelectasis is present at the left lung base. No pleural effusion is seen.  Hepatobiliary: No focal liver abnormality is seen. Status post cholecystectomy. No biliary dilatation.  Pancreas: Unremarkable. No pancreatic ductal dilatation or surrounding inflammatory changes.  Spleen: Normal in size without focal abnormality.  Adrenals/Urinary Tract: There is low attenuation thickening of the left adrenal gland, possible hyperplasia. The right adrenal gland is within normal limits. No renal calculus or hydronephrosis bilaterally. The bladder is unremarkable.  Stomach/Bowel: The stomach is within normal limits. No bowel obstruction, free air, or pneumatosis is seen. Appendix appears normal. No acute or active hemorrhage is identified.  Lymphatic: No abdominal or pelvic lymphadenopathy.  Reproductive: The uterus and right ovary are within normal limits. There is persistent asymmetric  enlargement of the left ovary measuring 3.8 cm, unchanged from multiple prior exams.  Other: No abdominopelvic ascites. A small fat containing umbilical hernia is present.  Musculoskeletal: Degenerative changes are noted in the thoracolumbar spine. Progression of a compression deformity in the superior endplate at L4 is noted. There is a healing fracture forming of the L4 transverse process on the right.  IMPRESSION: VASCULAR  1. No evidence of active hemorrhage. 2. Aortic atherosclerosis. 3. Extensive atherosclerotic calcification of the common iliac arteries bilaterally with moderate stenosis on the left and severe stenosis on the right.  NON-VASCULAR  1. No evidence of acute or active hemorrhage. 2. Patchy airspace disease at the right lung base, possible residual infiltrate. 3. Interval progression of L4 superior endplate compression deformity. 4. Cardiomegaly with multi-vessel coronary artery calcifications. 5. Emphysema.  Electronically Signed   By: Leita Birmingham M.D.   On: 05/02/2024 14:57 CT Chest W Contrast CLINICAL DATA:  Follow up pulmonary nodule.  EXAM: CT CHEST WITH CONTRAST  TECHNIQUE: Multidetector CT imaging of the chest was performed during intravenous contrast administration.  RADIATION DOSE REDUCTION: This exam was performed according to the departmental dose-optimization program which includes automated exposure control, adjustment of the mA  and/or kV according to patient size and/or use of iterative reconstruction technique.  CONTRAST:  75mL OMNIPAQUE  IOHEXOL  350 MG/ML SOLN  COMPARISON:  Chest CT 12/05/2023, 10/26/2023 and 04/24/2021. PET-CT 03/30/2024 and 06/21/2021.  FINDINGS: Cardiovascular: No acute vascular findings are demonstrated. Diffuse atherosclerosis of the aorta, great vessels and coronary arteries again noted. There are prominent calcifications of the aortic valve. The heart size is normal. No significant pericardial  fluid.  Mediastinum/Nodes: There are no enlarged mediastinal, hilar or axillary lymph nodes.Unchanged small mediastinal lymph nodes. The thyroid  gland, trachea and esophagus demonstrate no significant findings.  Lungs/Pleura: No pleural effusion or pneumothorax. Previously demonstrated right pleural effusion has resolved with improving aeration of the right lung base. Moderate centrilobular and paraseptal emphysema with diffuse central airway thickening and mucous plugging at both lung bases. Mild linear atelectasis in the lingula. Solid left lower lobe pulmonary nodule measures 1.7 x 1.3 cm on image 73/5 (1.6 x 1.4 cm on 12/05/2023). This measures up to 1.8 cm on sagittal image 125/7 (previously 1.7 cm). No new or other enlarging nodules identified.  Upper abdomen: The visualized upper abdomen appears stable, without significant findings.  Musculoskeletal/Chest wall: There is no chest wall mass or suspicious osseous finding. Healing rib fractures on the right. Mild multilevel spondylosis.  IMPRESSION: 1. Stable size of solid left lower lobe pulmonary nodule, without significant metabolic activity on PET-CT. Despite slow growth from older prior studies, this is likely a benign finding based on recent stability and negative cytology on 04/07/2024. Consider continued surveillance in 6-12 months. 2. No new or enlarging pulmonary nodules. 3. No acute chest findings. 4. Aortic Atherosclerosis (ICD10-I70.0) and Emphysema (ICD10-J43.9).  Electronically Signed   By: Elsie Perone M.D.   On: 05/02/2024 14:48 DG Chest 1 View EXAM: 1 VIEW(S) XRAY OF THE CHEST 05/02/2024 11:43:00 AM  COMPARISON: 02/21/2024  CLINICAL HISTORY: Dyspnea ; Per note:; Pt here POV with reports of dark stool and some BRB in stool for the last several weeks. Currently taking clopidigrel instead of her eliquis  due to the bleeding. Endorses some shob and intermittent chest pain. Recent placement of stent  in L carotid  FINDINGS:  LUNGS AND PLEURA: Ill-defined opacity at left lung base with possible small left effusion. Nodular density in left mid lung, 18 mm. No pulmonary edema. No pneumothorax.  HEART AND MEDIASTINUM: Mild cardiomegaly, stable. Aortic atherosclerosis.  BONES AND SOFT TISSUES: Multiple remote right rib fractures.  IMPRESSION: 1. Ill-defined opacity at the left lung base with possible small left pleural effusion. 2. 18 mm nodular density in the left mid lung; recommend dedicated chest CT for further characterization.  Electronically signed by: Taylor Stroud MD 05/02/2024 12:04 PM EDT RP Workstation: [id number]     Scheduled inpatient medications:   sodium chloride    Intravenous Once   amiodarone   200 mg Oral Daily   atorvastatin   80 mg Oral Daily   busPIRone   10 mg Oral BID   escitalopram   20 mg Oral Daily   fluticasone  furoate-vilanterol  1 puff Inhalation Daily   metoprolol  tartrate  25 mg Oral BID   pantoprazole   40 mg Oral Daily   umeclidinium bromide   1 puff Inhalation Daily   Continuous inpatient infusions:  PRN inpatient medications: acetaminophen  **OR** acetaminophen , albuterol , ALPRAZolam   Vital signs in last 24 hours: Temp:  [97.8 F (36.6 C)-98.6 F (37 C)] 97.8 F (36.6 C) (09/29 0838) Pulse Rate:  [81-87] 87 (09/29 1131) Resp:  [15-18] 18 (09/29 0838) BP: (125-157)/(65-78) 125/70 (09/29 1131) SpO2:  [  91 %-97 %] 96 % (09/29 1130) Last BM Date : 05/04/24  Intake/Output Summary (Last 24 hours) at 05/04/2024 1144 Last data filed at 05/04/2024 0900 Gross per 24 hour  Intake 360 ml  Output --  Net 360 ml    Intake/Output from previous day: 09/28 0701 - 09/29 0700 In: 592 [P.O.:120; Blood:472] Out: -  Intake/Output this shift: Total I/O In: 360 [P.O.:360] Out: -    Physical Exam:  General: Alert female in NAD Heart:  Regular rate .  Pulmonary: Normal respiratory effort Abdomen: Soft, nondistended, nontender. Normal bowel  sounds. Extremities: No lower extremity edema  Neurologic: Alert and oriented Psych: Pleasant. Cooperative     LOS: 2 days   Vina Dasen ,NP 05/04/2024, 11:44 AM    Attending physician's note   I have taken a history, reviewed the chart, and examined the patient. I performed a substantive portion of this encounter, including complete performance of at least one of the key components, in conjunction with the APP. I agree with the APP's note, impression, and recommendations with my edits.  No acute events overnight.  BM earlier today and she thought maybe there was some slight blood mixed around the stool.  Tolerating liquids without issue.  Good response to 2 unit RBC transfusion with H/H 10.5/32 today, but Hgb overall down from baseline ~13.  Received IV iron.  We had an extended conversation today regarding her suspected GI bleed to include endoscopic diagnostic and therapeutic capabilities with plan for the following:  - Clears today, bowel prep this evening - N.p.o. at midnight aside from prep and medications - EGD/colonoscopy tomorrow for diagnostic and therapeutic intent - Holding Eliquis  and Plavix   The indications, risks, and benefits of EGD and colonoscopy were explained to the patient in detail. Risks include but are not limited to bleeding, perforation, adverse reaction to medications, and cardiopulmonary compromise. Sequelae include but are not limited to the possibility of surgery, hospitalization, and mortality.  We specifically discussed her increased risks with endoscopic procedures and sedation due to underlying comorbidities, to include A-fib, COPD, carotid stenosis.  The patient verbalized understanding and wished to proceed. All questions answered.    [address] DO, FACG [phone] office

## 2024-05-04 NOTE — Progress Notes (Signed)
   05/04/24 0838  Vitals  Temp 97.8 F (36.6 C)  BP 131/65  MAP (mmHg) 84  BP Location Left Arm  BP Method Automatic  Patient Position (if appropriate) Lying  Pulse Rate 81  Pulse Rate Source Monitor  Resp 18  Level of Consciousness  Level of Consciousness Alert  MEWS COLOR  MEWS Score Color Green  Oxygen Therapy  SpO2 97 %  O2 Device Room Air  Pain Assessment  Pain Scale 0-10  Pain Score 0  MEWS Score  MEWS Temp 0  MEWS Systolic 0  MEWS Pulse 0  MEWS RR 0  MEWS LOC 0  MEWS Score 0

## 2024-05-04 NOTE — Progress Notes (Signed)
Consent signed and in pt chart

## 2024-05-04 NOTE — TOC Initial Note (Addendum)
 Transition of Care (TOC) - Initial/Assessment Note   Spoke to patient at bedside.   PAtient from home with daughter.   PCP is Comer Gaskins   Currently no home health services.   Patient independent prior to admission. Does have cane, walker and bedside commode at home ( from previous admission due to fall) .   Patient does not currently have home oxygen  Patient Details  Name: Denise [name] MRN: [id number] Date of Birth: Jan 28, 1961  [facility]) CM/SW Contact:    Stephane Powell Jansky, RN Phone Number: 05/04/2024, 9:37 AM  Clinical Narrative:                   Expected Discharge Plan: Home/Self Care Barriers to Discharge: Continued Medical Work up   Patient Goals and CMS Choice Patient states their goals for this hospitalization and ongoing recovery are:: to return to home   Choice offered to / list presented to : NA      Expected Discharge Plan and Services In-house Referral: NA Discharge Planning Services: CM Consult Post Acute Care Choice: NA Living arrangements for the past 2 months: Single Family Home                 DME Arranged: N/A DME Agency: NA       HH Arranged: NA HH Agency: NA        Prior Living Arrangements/Services Living arrangements for the past 2 months: Single Family Home Lives with:: Adult Children Patient language and need for interpreter reviewed:: Yes        Need for Family Participation in Patient Care: Yes (Comment) Care giver support system in place?: Yes (comment) Current home services: DME Criminal Activity/Legal Involvement Pertinent to Current Situation/Hospitalization: No - Comment as needed  Activities of Daily Living   ADL Screening (condition at time of admission) Independently performs ADLs?: Yes (appropriate for developmental age) Is the patient deaf or have difficulty hearing?: No Does the patient have difficulty seeing, even when wearing glasses/contacts?: No Does the patient have difficulty  concentrating, remembering, or making decisions?: No  Permission Sought/Granted   Permission granted to share information with : No              Emotional Assessment Appearance:: Appears stated age Attitude/Demeanor/Rapport: Engaged Affect (typically observed): Appropriate Orientation: : Oriented to Self, Oriented to Place, Oriented to  Time, Oriented to Situation Alcohol / Substance Use: Not Applicable Psych Involvement: No (comment)  Admission diagnosis:  GI bleeding [K92.2] Acute GI bleeding [K92.2] Symptomatic anemia [D64.9] Patient Active Problem List   Diagnosis Date Noted   Platelet inhibition due to Plavix  05/03/2024   Acute lower GI bleeding 05/02/2024   Choking 04/17/2024   Seizure (HCC) 02/21/2024   Aortic stenosis 02/19/2024   Atrial flutter with rapid ventricular response (HCC) 02/18/2024   History of GI bleed 02/18/2024   Asymptomatic carotid artery stenosis without infarction, left 02/12/2024   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   Visual disturbance 09/18/2023   Carotid artery disease 06/04/2023   Frequent headaches 05/23/2023   Primary osteoarthritis of first carpometacarpal joint of right hand 01/04/2023   Primary osteoarthritis of first carpometacarpal joint of left hand 01/04/2023   Right carpal tunnel syndrome 01/04/2023   Left carpal tunnel syndrome 01/04/2023   CAD (coronary artery disease) 12/27/2022   Encounter for annual general medical examination with abnormal findings in adult 12/27/2022   Chronic pain of both wrists 12/27/2022   Carotid atherosclerosis, bilateral 12/27/2022  Hyperlipidemia 12/27/2021   Iron deficiency anemia due to chronic blood loss 12/27/2021   Adnexal mass 09/08/2021   Anxiety and depression 06/16/2021   Lung nodule 12/30/2019   Sessile colonic polyp 12/30/2019   AVM (arteriovenous malformation) of colon 12/30/2019   Murmur 12/22/2019   Exertional dyspnea 12/22/2019   Cardiomegaly 12/22/2019    Tobacco abuse 12/22/2019   COPD (chronic obstructive pulmonary disease) (HCC) 12/22/2019   Former smoker 08/31/2019   Essential hypertension 03/31/2012   PCP:  Gretta Comer POUR, NP Pharmacy:   [facility] [phone] - [address], [place] - [address] [place] [address] Phone: [phone] Fax: [phone]  [facility] [address] [place] [address] Phone: [phone] Fax: [phone]     Social Drivers of Health (SDOH) Social History: SDOH Screenings   Food Insecurity: No Food Insecurity (05/02/2024)  Recent Concern: Food Insecurity - Food Insecurity Present (02/19/2024)  Housing: High Risk (05/02/2024)  Transportation Needs: Unmet Transportation Needs (05/02/2024)  Utilities: Not At Risk (05/02/2024)  Alcohol Screen: Low Risk  (01/28/2024)  Depression (PHQ2-9): High Risk (04/17/2024)  Financial Resource Strain: Patient Declined (01/28/2024)  Physical Activity: Insufficiently Active (01/28/2024)  Social Connections: Moderately Isolated (05/02/2024)  Stress: Stress Concern Present (01/28/2024)  Tobacco Use: Medium Risk (05/02/2024)   SDOH Interventions:     Readmission Risk Interventions    02/13/2024    3:05 PM  Readmission Risk Prevention Plan  Post Dischage Appt Complete  Medication Screening Complete  Transportation Screening Complete

## 2024-05-04 NOTE — Progress Notes (Signed)
 Note written in error - incorrect patient

## 2024-05-04 NOTE — Progress Notes (Signed)
 PROGRESS NOTE  Denise [name] FMW:[id number] DOB: [birth year] DOA: 05/02/2024 PCP: Denise Comer POUR, NP   LOS: 2 days   Brief Narrative / Interim history: Denise [name] is a 63 y.o. year old female with past medical history of GI bleed 2/2 to to AVM, HTN, HLD c/b left carotid stenosis s/p stent placement in 02/2024, COPD, Afib presenting today for blood in her stools.  This has been going on for few weeks, was referred to GI as an outpatient but developed worsening fatigue, weakness, shortness of breath and decided to come to the hospital.  Was found to have a hemoglobin of 8 from a baseline of 13 about 3 weeks ago.  GI was consulted  Subjective / 24h Interval events: Doing well, no abdominal pain, no nausea or vomiting.  She is going to have colon prep for colonoscopy and EGD tomorrow  Assesement and Plan: Principal problem Acute blood loss anemia in the setting of GI bleed-GI bleed is likely upper given dark stools.  Hemoglobin dipped down to 7.6 overnight, and she received 2 units of packed blood cells.  Hemoglobin improved appropriately and has remained stable - Continue to monitor, appreciate GI follow-up, plans for endoscopic studies tomorrow - Received IV iron  Active problems PAF-currently in sinus, continue amiodarone , anticoagulated with Eliquis  at home but now on hold due to bleeding  Hyperlipidemia-continue statin  Hyponatremia-appears chronic, sodium overall stable  Underlying COPD-stable, no wheezing, this at baseline  Essential hypertension-slightly higher blood pressures overnight, but higher normal this morning.  Resume metoprolol  today at a lower dose  Scheduled Meds:  sodium chloride    Intravenous Once   amiodarone   200 mg Oral Daily   atorvastatin   80 mg Oral Daily   busPIRone   10 mg Oral BID   escitalopram   20 mg Oral Daily   fluticasone  furoate-vilanterol  1 puff Inhalation Daily   pantoprazole   40 mg Oral Daily   umeclidinium bromide   1 puff  Inhalation Daily   Continuous Infusions: PRN Meds:.acetaminophen  **OR** acetaminophen , albuterol , ALPRAZolam   Current Outpatient Medications  Medication Instructions   acetaminophen  (TYLENOL ) 1,000 mg, Oral, Every 8 hours PRN   albuterol  (VENTOLIN  HFA) 108 (90 Base) MCG/ACT inhaler INHALE 2 PUFFS INTO THE LUNGS EVERY 4-6 HOURS AS NEEDED FOR TIGHTNESS/WHEEZING   amiodarone  (PACERONE ) 200 mg, Oral, Daily   amLODipine  (NORVASC ) 10 mg, Oral, Daily   apixaban  (ELIQUIS ) 5 mg, Oral, 2 times daily   aspirin  EC 81 mg, Oral, Daily, Swallow whole.   atorvastatin  (LIPITOR ) 80 mg, Oral, Daily, for cholesterol.   busPIRone  (BUSPAR ) 10 mg, Oral, 2 times daily, For anxiety   clopidogrel  (PLAVIX ) 75 mg, Daily   cyanocobalamin  (VITAMIN B12) 1,000 mcg, Daily   escitalopram  (LEXAPRO ) 20 MG tablet TAKE 1 TABLET BY MOUTH DAILY FOR ANXIETY   [Paused] ferrous sulfate  650 mg, Daily with breakfast   fluticasone -salmeterol (WIXELA INHUB) 250-50 MCG/ACT AEPB 1 puff, Inhalation, 2 times daily   lisinopril  (ZESTRIL ) 30 MG tablet TAKE 1 TABLET (30 MG TOTAL) BY MOUTH DAILY FOR BLOOD PRESSURE.   metoprolol  tartrate (LOPRESSOR ) 50 mg, Oral, 2 times daily   pantoprazole  (PROTONIX ) 40 mg, Oral, Daily, For heartburn   pyridoxine  (B-6) 100 mg, Daily   sodium chloride  1 g, Oral, 2 times daily with meals, To increase sodium   Tiotropium Bromide Monohydrate  (SPIRIVA  RESPIMAT) 2.5 MCG/ACT AERS 2 puffs, Inhalation, Daily   vitamin C  2,000 mg, Daily   vitamin E  400 Units, Daily    Diet Orders (From admission, onward)  Start     Ordered   05/04/24 0500  Diet clear liquid Room service appropriate? Yes; Fluid consistency: Thin  Diet effective 0500 tomorrow       Question Answer Comment  Room service appropriate? Yes   Fluid consistency: Thin      05/03/24 1628            DVT prophylaxis: SCDs Start: 05/02/24 1615   Lab Results  Component Value Date   PLT 392 05/04/2024      Code Status: Full Code  Family  Communication: No family at bedside  Status is: Inpatient Remains inpatient appropriate because: Severity of illness   Level of care: Telemetry Medical  Consultants:  Gastroenterology  Objective: Vitals:   05/03/24 1738 05/03/24 2026 05/04/24 0425 05/04/24 0838  BP: (!) 151/78 (!) 142/73 (!) 157/69 131/65  Pulse: 84 82 85 81  Resp: 15   18  Temp: 98.2 F (36.8 C) 98.6 F (37 C) 98.2 F (36.8 C) 97.8 F (36.6 C)  TempSrc: Oral Oral Oral   SpO2: 92% 97% 91% 97%  Weight:      Height:       No intake or output data in the 24 hours ending 05/04/24 0928 Wt Readings from Last 3 Encounters:  05/02/24 72.5 kg  04/17/24 72.6 kg  04/10/24 72.9 kg    Examination:  Constitutional: NAD Eyes: lids and conjunctivae normal, no scleral icterus ENMT: mmm Neck: normal, supple Respiratory: clear to auscultation bilaterally, no wheezing, no crackles. Cardiovascular: Regular rate and rhythm, no murmurs / rubs / gallops. No LE edema. Abdomen: soft, no distention, no tenderness. Bowel sounds positive.    Data Reviewed: I have independently reviewed following labs and imaging studies   CBC Recent Labs  Lab 05/02/24 1126 05/02/24 1139 05/03/24 0008 05/03/24 1230 05/03/24 1719 05/04/24 0047 05/04/24 0745  WBC 9.0  --  7.5  --   --   --  6.7  HGB 8.0*   < > 7.6* 10.0* 9.9* 9.6* 10.5*  10.5*  HCT 26.2*   < > 24.2* 30.1* 30.6* 29.3* 32.3*  32.3*  PLT 461*  --  363  --   --   --  392  MCV 93.9  --  90.6  --   --   --  89.0  MCH 28.7  --  28.5  --   --   --  28.9  MCHC 30.5  --  31.4  --   --   --  32.5  RDW 17.3*  --  17.5*  --   --   --  16.8*  LYMPHSABS 0.8  --   --   --   --   --   --   MONOABS 0.9  --   --   --   --   --   --   EOSABS 0.1  --   --   --   --   --   --   BASOSABS 0.0  --   --   --   --   --   --    < > = values in this interval not displayed.    Recent Labs  Lab 05/02/24 1126 05/02/24 1139 05/03/24 0008 05/04/24 0745  NA 127* 129* 133* 134*  K 3.7  4.0 3.7 3.6  CL 95* 96* 100 99  CO2 22  --  24 25  GLUCOSE 89 89 102* 95  BUN 8 7* <5* <5*  CREATININE 0.69 0.80 0.59  0.59  CALCIUM  8.3*  --  8.4* 8.8*  AST 37  --   --  28  ALT 32  --   --  28  ALKPHOS 82  --   --  81  BILITOT 0.5  --   --  0.7  ALBUMIN  3.2*  --   --  3.3*  MG 1.9  --   --  2.2  INR 1.0  --   --   --     ------------------------------------------------------------------------------------------------------------------ No results for input(s): CHOL, HDL, LDLCALC, TRIG, CHOLHDL, LDLDIRECT in the last 72 hours.  Lab Results  Component Value Date   HGBA1C 5.4 12/27/2022   ------------------------------------------------------------------------------------------------------------------ No results for input(s): TSH, T4TOTAL, T3FREE, THYROIDAB in the last 72 hours.  Invalid input(s): FREET3  Cardiac Enzymes No results for input(s): CKMB, TROPONINI, MYOGLOBIN in the last 168 hours.  Invalid input(s): CK ------------------------------------------------------------------------------------------------------------------    Component Value Date/Time   BNP 132.4 (H) 02/23/2024 0650    CBG: No results for input(s): GLUCAP in the last 168 hours.  No results found for this or any previous visit (from the past 240 hours).   Radiology Studies: No results found.    Nilda Fendt, MD, PhD [facility]  Between 7 am - 7 pm I am available, please contact me via Amion (for emergencies) or Securechat (non urgent messages)  Between 7 pm - 7 am I am not available, please contact night coverage MD/APP via Amion

## 2024-05-04 NOTE — Progress Notes (Signed)
 Pt requested to be unhooked from IV while taking prep and needing to go back and forth to the bathroom

## 2024-05-04 NOTE — Plan of Care (Signed)
  Problem: Clinical Measurements: Goal: Ability to maintain clinical measurements within normal limits will improve Outcome: Progressing   Problem: Clinical Measurements: Goal: Diagnostic test results will improve Outcome: Progressing   Problem: Pain Managment: Goal: General experience of comfort will improve and/or be controlled Outcome: Progressing   Problem: Safety: Goal: Ability to remain free from injury will improve Outcome: Progressing

## 2024-05-05 ENCOUNTER — Encounter (HOSPITAL_COMMUNITY): Payer: Self-pay | Admitting: Internal Medicine

## 2024-05-05 ENCOUNTER — Encounter (HOSPITAL_COMMUNITY)
Admission: EM | Disposition: A | Discharge status: Home / Self Care | Payer: Self-pay | Source: Home / Self Care | Attending: Internal Medicine

## 2024-05-05 ENCOUNTER — Inpatient Hospital Stay (HOSPITAL_COMMUNITY): Payer: Self-pay | Admitting: Certified Registered Nurse Anesthetist

## 2024-05-05 ENCOUNTER — Other Ambulatory Visit (HOSPITAL_COMMUNITY): Payer: Self-pay

## 2024-05-05 DIAGNOSIS — D123 Benign neoplasm of transverse colon: Secondary | ICD-10-CM

## 2024-05-05 DIAGNOSIS — K2970 Gastritis, unspecified, without bleeding: Secondary | ICD-10-CM

## 2024-05-05 DIAGNOSIS — D125 Benign neoplasm of sigmoid colon: Secondary | ICD-10-CM

## 2024-05-05 DIAGNOSIS — K31819 Angiodysplasia of stomach and duodenum without bleeding: Secondary | ICD-10-CM

## 2024-05-05 DIAGNOSIS — I2510 Atherosclerotic heart disease of native coronary artery without angina pectoris: Secondary | ICD-10-CM

## 2024-05-05 DIAGNOSIS — K922 Gastrointestinal hemorrhage, unspecified: Secondary | ICD-10-CM | POA: Diagnosis not present

## 2024-05-05 DIAGNOSIS — K3189 Other diseases of stomach and duodenum: Secondary | ICD-10-CM

## 2024-05-05 DIAGNOSIS — D62 Acute posthemorrhagic anemia: Secondary | ICD-10-CM

## 2024-05-05 DIAGNOSIS — K648 Other hemorrhoids: Secondary | ICD-10-CM

## 2024-05-05 DIAGNOSIS — D649 Anemia, unspecified: Secondary | ICD-10-CM

## 2024-05-05 DIAGNOSIS — K641 Second degree hemorrhoids: Secondary | ICD-10-CM

## 2024-05-05 DIAGNOSIS — K5520 Angiodysplasia of colon without hemorrhage: Secondary | ICD-10-CM

## 2024-05-05 HISTORY — PX: POLYPECTOMY: SHX149

## 2024-05-05 HISTORY — PX: HOT HEMOSTASIS: SHX5433

## 2024-05-05 HISTORY — PX: ESOPHAGOGASTRODUODENOSCOPY: SHX5428

## 2024-05-05 HISTORY — PX: COLONOSCOPY: SHX5424

## 2024-05-05 LAB — HEMOGLOBIN AND HEMATOCRIT, BLOOD
HCT: 30.6 % — ABNORMAL LOW (ref 36.0–46.0)
HCT: 32.6 % — ABNORMAL LOW (ref 36.0–46.0)
Hemoglobin: 10.3 g/dL — ABNORMAL LOW (ref 12.0–15.0)
Hemoglobin: 9.8 g/dL — ABNORMAL LOW (ref 12.0–15.0)

## 2024-05-05 SURGERY — COLONOSCOPY
Anesthesia: Monitor Anesthesia Care

## 2024-05-05 MED ORDER — PHENYLEPHRINE 80 MCG/ML (10ML) SYRINGE FOR IV PUSH (FOR BLOOD PRESSURE SUPPORT)
PREFILLED_SYRINGE | INTRAVENOUS | Status: DC | PRN
  Administered 2024-05-05: 160 ug via INTRAVENOUS
  Administered 2024-05-05: 80 ug via INTRAVENOUS

## 2024-05-05 MED ORDER — LIDOCAINE 2% (20 MG/ML) 5 ML SYRINGE
INTRAMUSCULAR | Status: DC | PRN
  Administered 2024-05-05: 100 mg via INTRAVENOUS

## 2024-05-05 MED ORDER — FERROUS SULFATE 325 (65 FE) MG PO TABS
325.0000 mg | ORAL_TABLET | Freq: Two times a day (BID) | ORAL | 0 refills | Status: DC
  Filled 2024-05-05: qty 90, 45d supply, fill #0

## 2024-05-05 MED ORDER — PROPOFOL 500 MG/50ML IV EMUL
INTRAVENOUS | Status: DC | PRN
  Administered 2024-05-05: 125 ug/kg/min via INTRAVENOUS

## 2024-05-05 MED ORDER — PROPOFOL 10 MG/ML IV BOLUS
INTRAVENOUS | Status: DC | PRN
  Administered 2024-05-05: 10 mg via INTRAVENOUS
  Administered 2024-05-05: 40 mg via INTRAVENOUS

## 2024-05-05 MED ORDER — FERROUS SULFATE 325 (65 FE) MG PO TABS
325.0000 mg | ORAL_TABLET | Freq: Every day | ORAL | 0 refills | Status: DC
  Filled 2024-05-05: qty 30, 30d supply, fill #0

## 2024-05-05 NOTE — Progress Notes (Signed)
 Discharge instructions given to pt. Pt verbalized understanding of all teaching and had no further questions. SWOT nurse picked up discharge meds from pharmacy and they were given to pt prior to discharge

## 2024-05-05 NOTE — Progress Notes (Signed)
Patient off unit for colonoscopy.

## 2024-05-05 NOTE — Discharge Summary (Addendum)
 Physician Discharge Summary  Denise [name] FMW:[id number] DOB: Oct 13, 1960 DOA: 05/02/2024  PCP: Gretta Comer POUR, NP  Admit date: 05/02/2024 Discharge date: 05/05/2024  Admitted From: home Disposition:  home  Recommendations for Outpatient Follow-up:  Follow up with PCP in 1-2 weeks Please obtain BMP/CBC in one week  Home Health: none Equipment/Devices: none  Discharge Condition: stable CODE STATUS: Full code  Brief Narrative / Interim history: Denise [name] is a 63 y.o. year old female with past medical history of GI bleed 2/2 to to AVM, HTN, HLD c/b left carotid stenosis s/p stent placement in 02/2024, COPD, Afib presenting today for blood in her stools.  This has been going on for few weeks, was referred to GI as an outpatient but developed worsening fatigue, weakness, shortness of breath and decided to come to the hospital.  Was found to have a hemoglobin of 8 from a baseline of 13 about 3 weeks ago.    Hospital Course / Discharge diagnoses: Principal problem Acute blood loss anemia in the setting of GI bleed -patient was admitted to the hospital with concern for GI bleed, prior known AVMs.  Gastroenterology consulted and followed patient while hospitalized.  Her hemoglobin was 13 few weeks prior to admission, during this hospitalization dropped down to 7.6.  She received 2 unit of her blood cells, hemoglobin improved appropriately and has remained stable.  Gastroenterology did an EGD and a colonoscopy, and the EGD showed single gastric AVM treated with APC and mild gastritis (biopsied). Colonoscopy with single AVM in the ascending colon treated with APC and 1 hemostatic clip, 3 small polyps all removed, and small internal hemorrhoids.  She recovered well following the procedure, is tolerating diet, and with stability in her hemoglobin and no further bleeding she will be discharged home in stable condition.  Per gastroenterology, she is okay to resume her Eliquis  and Plavix   a day following discharge, I have discussed with patient and she will follow-up with her PCP for repeat CBC in 5 to 7 days.  She received IV iron while hospitalized, will be discharged home with iron supplementation   Active problems PAF-currently in sinus, continue amiodarone , anticoagulated with Eliquis  at home but now on hold due to bleeding, resume Eliquis  tomorrow  Hyperlipidemia-continue statin Hyponatremia-appears chronic, sodium overall stable Underlying COPD-stable, no wheezing, this at baseline Essential hypertension-continue home medications  Sepsis ruled out   Discharge Instructions   Allergies as of 05/05/2024   No Known Allergies      Medication List     PAUSE taking these medications    apixaban  5 MG Tabs tablet Wait to take this until: May 06, 2024 Commonly known as: ELIQUIS  Take 1 tablet (5 mg total) by mouth 2 (two) times daily.   clopidogrel  75 MG tablet Wait to take this until: May 06, 2024 Commonly known as: PLAVIX  Take 75 mg by mouth daily.       STOP taking these medications    aspirin  EC 81 MG tablet   ferrous sulfate  325 (65 FE) MG tablet Replaced by: ferrous sulfate  325 (65 FE) MG EC tablet       TAKE these medications    acetaminophen  500 MG tablet Commonly known as: TYLENOL  Take 2 tablets (1,000 mg total) by mouth every 8 (eight) hours as needed.   albuterol  108 (90 Base) MCG/ACT inhaler Commonly known as: VENTOLIN  HFA INHALE 2 PUFFS INTO THE LUNGS EVERY 4-6 HOURS AS NEEDED FOR TIGHTNESS/WHEEZING   amiodarone  200 MG tablet Commonly known  as: PACERONE  Take 1 tablet (200 mg total) by mouth daily.   amLODipine  10 MG tablet Commonly known as: NORVASC  Take 1 tablet (10 mg total) by mouth daily.   atorvastatin  80 MG tablet Commonly known as: LIPITOR  TAKE 1 TABLET (80 MG TOTAL) BY MOUTH DAILY FOR CHOLESTEROL   busPIRone  10 MG tablet Commonly known as: BUSPAR  Take 1 tablet (10 mg total) by mouth 2 (two) times daily. For  anxiety   cyanocobalamin  1000 MCG tablet Commonly known as: VITAMIN B12 Take 1,000 mcg by mouth daily.   escitalopram  20 MG tablet Commonly known as: LEXAPRO  TAKE 1 TABLET BY MOUTH DAILY FOR ANXIETY   ferrous sulfate  325 (65 FE) MG EC tablet Take 1 tablet (325 mg total) by mouth 2 (two) times daily. Replaces: ferrous sulfate  325 (65 FE) MG tablet   fluticasone -salmeterol 250-50 MCG/ACT Aepb Commonly known as: Wixela Inhub Inhale 1 puff into the lungs in the morning and at bedtime.   lisinopril  30 MG tablet Commonly known as: ZESTRIL  TAKE 1 TABLET (30 MG TOTAL) BY MOUTH DAILY FOR BLOOD PRESSURE.   metoprolol  tartrate 50 MG tablet Commonly known as: LOPRESSOR  Take 1 tablet (50 mg total) by mouth 2 (two) times daily.   pantoprazole  40 MG tablet Commonly known as: Protonix  Take 1 tablet (40 mg total) by mouth daily. For heartburn   pyridoxine  100 MG tablet Commonly known as: B-6 Take 100 mg by mouth daily.   sodium chloride  1 g tablet TAKE 1 TABLET (1 G TOTAL) BY MOUTH 2 (TWO) TIMES DAILY WITH A MEAL. TO INCREASE SODIUM   Spiriva  Respimat 2.5 MCG/ACT Aers Generic drug: Tiotropium Bromide Monohydrate  Inhale 2 puffs into the lungs daily.   vitamin C  1000 MG tablet Take 2,000 mg by mouth daily.   vitamin E  180 MG (400 UNITS) capsule Take 400 Units by mouth daily.       Consultations: GGI  Procedures/Studies:  CT Angio Abd/Pel W and/or Wo Contrast Result Date: 05/02/2024 CLINICAL DATA:  Lower GI bleed. EXAM: CTA ABDOMEN AND PELVIS WITHOUT AND WITH CONTRAST TECHNIQUE: Multidetector CT imaging of the abdomen and pelvis was performed using the standard protocol during bolus administration of intravenous contrast. Multiplanar reconstructed images and MIPs were obtained and reviewed to evaluate the vascular anatomy. RADIATION DOSE REDUCTION: This exam was performed according to the departmental dose-optimization program which includes automated exposure control, adjustment  of the mA and/or kV according to patient size and/or use of iterative reconstruction technique. CONTRAST:  75mL OMNIPAQUE  IOHEXOL  350 MG/ML SOLN COMPARISON:  10/26/2023. FINDINGS: VASCULAR Aorta: Normal caliber aorta without aneurysm, dissection, vasculitis or significant stenosis. Aortic atherosclerosis with mural thrombus. Celiac: Patent without evidence of aneurysm, dissection, vasculitis or significant stenosis. SMA: Patent without evidence of aneurysm, dissection, vasculitis or significant stenosis. Renals: Both renal arteries are patent without evidence of aneurysm, dissection, vasculitis, fibromuscular dysplasia. Moderate renal artery stenosis is present bilaterally at the ostia. IMA: Patent. Inflow: Heavy atherosclerotic calcification with severe stenosis involving the common iliac artery on the right. There is moderate stenosis involving the common iliac artery on the left. The external and internal iliac arteries are patent bilaterally. Proximal Outflow: Bilateral common femoral and visualized portions of the superficial and profunda femoral arteries are patent without evidence of aneurysm, dissection, vasculitis or significant stenosis. Veins: No obvious venous abnormality within the limitations of this arterial phase study. Review of the MIP images confirms the above findings. NON-VASCULAR Lower chest: The heart is enlarged and there is a trace pericardial effusion. Multi-vessel  coronary artery calcifications are noted. Emphysematous changes are present in the lungs. Airspace opacities are noted at the right lung base, possible atelectasis or infiltrate. Atelectasis is present at the left lung base. No pleural effusion is seen. Hepatobiliary: No focal liver abnormality is seen. Status post cholecystectomy. No biliary dilatation. Pancreas: Unremarkable. No pancreatic ductal dilatation or surrounding inflammatory changes. Spleen: Normal in size without focal abnormality. Adrenals/Urinary Tract: There is  low attenuation thickening of the left adrenal gland, possible hyperplasia. The right adrenal gland is within normal limits. No renal calculus or hydronephrosis bilaterally. The bladder is unremarkable. Stomach/Bowel: The stomach is within normal limits. No bowel obstruction, free air, or pneumatosis is seen. Appendix appears normal. No acute or active hemorrhage is identified. Lymphatic: No abdominal or pelvic lymphadenopathy. Reproductive: The uterus and right ovary are within normal limits. There is persistent asymmetric enlargement of the left ovary measuring 3.8 cm, unchanged from multiple prior exams. Other: No abdominopelvic ascites. A small fat containing umbilical hernia is present. Musculoskeletal: Degenerative changes are noted in the thoracolumbar spine. Progression of a compression deformity in the superior endplate at L4 is noted. There is a healing fracture forming of the L4 transverse process on the right. IMPRESSION: VASCULAR 1. No evidence of active hemorrhage. 2. Aortic atherosclerosis. 3. Extensive atherosclerotic calcification of the common iliac arteries bilaterally with moderate stenosis on the left and severe stenosis on the right. NON-VASCULAR 1. No evidence of acute or active hemorrhage. 2. Patchy airspace disease at the right lung base, possible residual infiltrate. 3. Interval progression of L4 superior endplate compression deformity. 4. Cardiomegaly with multi-vessel coronary artery calcifications. 5. Emphysema. Electronically Signed   By: Leita Birmingham M.D.   On: 05/02/2024 14:57   CT Chest W Contrast Result Date: 05/02/2024 CLINICAL DATA:  Follow up pulmonary nodule. EXAM: CT CHEST WITH CONTRAST TECHNIQUE: Multidetector CT imaging of the chest was performed during intravenous contrast administration. RADIATION DOSE REDUCTION: This exam was performed according to the departmental dose-optimization program which includes automated exposure control, adjustment of the mA and/or kV  according to patient size and/or use of iterative reconstruction technique. CONTRAST:  75mL OMNIPAQUE  IOHEXOL  350 MG/ML SOLN COMPARISON:  Chest CT 12/05/2023, 10/26/2023 and 04/24/2021. PET-CT 03/30/2024 and 06/21/2021. FINDINGS: Cardiovascular: No acute vascular findings are demonstrated. Diffuse atherosclerosis of the aorta, great vessels and coronary arteries again noted. There are prominent calcifications of the aortic valve. The heart size is normal. No significant pericardial fluid. Mediastinum/Nodes: There are no enlarged mediastinal, hilar or axillary lymph nodes.Unchanged small mediastinal lymph nodes. The thyroid  gland, trachea and esophagus demonstrate no significant findings. Lungs/Pleura: No pleural effusion or pneumothorax. Previously demonstrated right pleural effusion has resolved with improving aeration of the right lung base. Moderate centrilobular and paraseptal emphysema with diffuse central airway thickening and mucous plugging at both lung bases. Mild linear atelectasis in the lingula. Solid left lower lobe pulmonary nodule measures 1.7 x 1.3 cm on image 73/5 (1.6 x 1.4 cm on 12/05/2023). This measures up to 1.8 cm on sagittal image 125/7 (previously 1.7 cm). No new or other enlarging nodules identified. Upper abdomen: The visualized upper abdomen appears stable, without significant findings. Musculoskeletal/Chest wall: There is no chest wall mass or suspicious osseous finding. Healing rib fractures on the right. Mild multilevel spondylosis. IMPRESSION: 1. Stable size of solid left lower lobe pulmonary nodule, without significant metabolic activity on PET-CT. Despite slow growth from older prior studies, this is likely a benign finding based on recent stability and negative cytology on 04/07/2024.  Consider continued surveillance in 6-12 months. 2. No new or enlarging pulmonary nodules. 3. No acute chest findings. 4. Aortic Atherosclerosis (ICD10-I70.0) and Emphysema (ICD10-J43.9).  Electronically Signed   By: Elsie Perone M.D.   On: 05/02/2024 14:48   DG Chest 1 View Result Date: 05/02/2024 EXAM: 1 VIEW(S) XRAY OF THE CHEST 05/02/2024 11:43:00 AM COMPARISON: 02/21/2024 CLINICAL HISTORY: Dyspnea ; Per note:; Pt here POV with reports of dark stool and some BRB in stool for the last several weeks. Currently taking clopidigrel instead of her eliquis  due to the bleeding. Endorses some shob and intermittent chest pain. Recent placement of stent in L carotid FINDINGS: LUNGS AND PLEURA: Ill-defined opacity at left lung base with possible small left effusion. Nodular density in left mid lung, 18 mm. No pulmonary edema. No pneumothorax. HEART AND MEDIASTINUM: Mild cardiomegaly, stable. Aortic atherosclerosis. BONES AND SOFT TISSUES: Multiple remote right rib fractures. IMPRESSION: 1. Ill-defined opacity at the left lung base with possible small left pleural effusion. 2. 18 mm nodular density in the left mid lung; recommend dedicated chest CT for further characterization. Electronically signed by: Waddell Calk MD 05/02/2024 12:04 PM EDT RP Workstation: [id number]   AMBULATORY EEG Result Date: 04/27/2024 Gregg Lek, MD     04/27/2024  2:12 PM Patient Name: Wilmetta Speiser MRN: [id number] Study start date: 04/17/2024 at 718 AM Study end date: 04/19/2024 at 903 AM Duration: 50 hours Clinical History: 63 year old female here for evaluation of syncope versus seizure. History of bilateral carotid disease, hypertension, hyperlipidemia, peripheral vascular disease, aortic stenosis, paroxysmal atrial fibrillation. 02/12/2024 ?atient underwent left carotid stenting. Postoperative day 1 she developed atrial fibrillation and was started on Eliquis . She was discharged home. She returned on 02/16/2024 due to blood in stool. Eliquis  was stopped and she was discharged home. 02/17/2024 patient returned to the hospital due to atrial flutter and hypertension. Medications were adjusted. She was discharged home.  02/21/2024 patient was noting increased blood pressure at home took some extra medications and then passed out. No tongue biting or incontinence, There was some confusion for several minutes afterwards, She had some tinnitus and prodromal lightheadedness. He was evaluated for convulsive syncope versus seizure. Since the time patient is doing well. No further events. INTERMITTENT MONITORING with VIDEO TECHNICAL SUMMARY: This AVEEG was performed using equipment provided by [employer] utilizing Bluetooth ( [employer] ) amplifiers with continuous EEGT attended video collection using encrypted remote transmission via [employer] secured cellular tower network with data rates for each AVEEG performed. This is a Therapist, music AVEEG, obtained, according to the 10-20 international electrode placement system, reformatted digitally into referential and bipolar montages. Data was acquired with a minimum of 21 bipolar connections and sampled at a minimum rate of 250 cycles per second per channel, maximum rate of 450 cycles per second per channel and two channels for EKG. The entire VEEG study was recorded through cable and or radio telemetry for subsequent analysis. Specified epochs of the AVEEG data were identified at the direction of the subject by the depression of a push button by the patient. Each patients event file included data acquired two minutes prior to the push button activation and continuing until two minutes afterwards. AVEEG files were reviewed on [employer] server, Licensed Software provided by [employer] with a digital high frequency filter set at 70 Hz and a low frequency filter set at 1 Hz with a paper speed of 2mm/s resulting in 10 seconds per digital page. This entire AVEEG was reviewed by the EEG  Technologist. Random time samples, random sleep samples, clips, patient initiated push button files with included patient daily diary logs, EEG Technologist pruned data was reviewed and  verified for accuracy and validity by the governing reading neurologist in full details. This AEEGV was fully compliant with all requirements for CPT 97500 for setup, patient education, take down and administered by an EEG technologist. Long-Term EEG with Video was monitored intermittently by a qualified EEG technologist for the entirety of the recording; quality check-ins were performed at a minimum of every two hours, checking and documenting real-time data and video to assure the integrity and quality of the recording (e.g., camera position, electrode integrity and impedance), and identify the need for maintenance. For intermittent monitoring, an EEG Technologist monitored no more than 12 patients concurrently. Diagnostic video was captured at least 80% of the time during the recording. PATIENT EVENTS: There were no patient events noted or captured during this recording. TECHNOLOGIST EVENTS: No clear epileptiform activity was detected by the reviewing neurodiagnostic technologist for further review. TIME SAMPLES: 10-minutes of every 2 hours recorded are reviewed as random time samples SLEEP SAMPLES: 5-minutes of every 24 hours recorded are reviewed as random sleep samples. AWAKE: At maximal level of alertness, the posterior dominant background activity was continuous, reactive, low voltage rhythm of 9-10 Hz. This was symmetric, well-modulated, and attenuated with eye opening. Diffuse, symmetric, frontocentral beta range activity was present. SLEEP: N1 Sleep (Stage 1) was observed and characterized by the disappearance of alpha rhythm and the appearance of vertex activity. N2 Sleep (Stage 2) was observed and characterized by vertex waves, K-complexes, and sleep spindles. N3 (Stage 3) sleep was observed and characterized by high amplitude Delta activity of 20%. REM sleep was observed. EKG: There were no arrhythmias or abnormalities noted during this recording. 12 BPM. Impression: Normal EEG: awake and asleep  Clinical Correlation: This is a normal 2-day ambulatory EEG tracing. No focal abnormalities or epileptiform discharges were seen. There were no electrographic seizures noted. No events were captured during the recording. Please note a normal EEG does not exclude the diagnosis of epilepsy. Pastor Falling, MD [facility]    Subjective: - no chest pain, shortness of breath, no abdominal pain, nausea or vomiting.   Discharge Exam: BP 126/84   Pulse 71   Temp 98.3 F (36.8 C) (Oral)   Resp 18   Ht 5' 3 (1.6 m)   Wt 72.5 kg   SpO2 92%   BMI 28.31 kg/m   General: Pt is alert, awake, not in acute distress Cardiovascular: no edmea Respiratory: Normal respiratory effort Abdominal: No distention Extremities: no cyanosis   The results of significant diagnostics from this hospitalization (including imaging, microbiology, ancillary and laboratory) are listed below for reference.     Microbiology: No results found for this or any previous visit (from the past 240 hours).   Labs: Basic Metabolic Panel: Recent Labs  Lab 05/02/24 1126 05/02/24 1139 05/03/24 0008 05/04/24 0745  NA 127* 129* 133* 134*  K 3.7 4.0 3.7 3.6  CL 95* 96* 100 99  CO2 22  --  24 25  GLUCOSE 89 89 102* 95  BUN 8 7* <5* <5*  CREATININE 0.69 0.80 0.59 0.59  CALCIUM  8.3*  --  8.4* 8.8*  MG 1.9  --   --  2.2   Liver Function Tests: Recent Labs  Lab 05/02/24 1126 05/04/24 0745  AST 37 28  ALT 32 28  ALKPHOS 82 81  BILITOT 0.5 0.7  PROT  6.4* 6.6  ALBUMIN  3.2* 3.3*   CBC: Recent Labs  Lab 05/02/24 1126 05/02/24 1139 05/03/24 0008 05/03/24 1230 05/04/24 0047 05/04/24 0745 05/04/24 1503 05/05/24 0047 05/05/24 1114  WBC 9.0  --  7.5  --   --  6.7  --   --   --   NEUTROABS 7.2  --   --   --   --   --   --   --   --   HGB 8.0*   < > 7.6*   < > 9.6* 10.5*  10.5* 10.2* 9.8* 10.3*  HCT 26.2*   < > 24.2*   < > 29.3* 32.3*  32.3* 31.5* 30.6* 32.6*  MCV 93.9  --  90.6  --   --  89.0   --   --   --   PLT 461*  --  363  --   --  392  --   --   --    < > = values in this interval not displayed.   CBG: No results for input(s): GLUCAP in the last 168 hours. Hgb A1c No results for input(s): HGBA1C in the last 72 hours. Lipid Profile No results for input(s): CHOL, HDL, LDLCALC, TRIG, CHOLHDL, LDLDIRECT in the last 72 hours. Thyroid  function studies No results for input(s): TSH, T4TOTAL, T3FREE, THYROIDAB in the last 72 hours.  Invalid input(s): FREET3 Urinalysis    Component Value Date/Time   COLORURINE STRAW (A) 02/21/2024 1736   APPEARANCEUR CLEAR 02/21/2024 1736   LABSPEC 1.013 02/21/2024 1736   PHURINE 5.0 02/21/2024 1736   GLUCOSEU NEGATIVE 02/21/2024 1736   HGBUR NEGATIVE 02/21/2024 1736   BILIRUBINUR NEGATIVE 02/21/2024 1736   BILIRUBINUR neg 12/27/2022 0955   KETONESUR NEGATIVE 02/21/2024 1736   PROTEINUR NEGATIVE 02/21/2024 1736   UROBILINOGEN 0.2 12/27/2022 0955   UROBILINOGEN 0.2 03/29/2012 2003   NITRITE NEGATIVE 02/21/2024 1736   LEUKOCYTESUR NEGATIVE 02/21/2024 1736    FURTHER DISCHARGE INSTRUCTIONS:   Get Medicines reviewed and adjusted: Please take all your medications with you for your next visit with your Primary MD   Laboratory/radiological data: Please request your Primary MD to go over all hospital tests and procedure/radiological results at the follow up, please ask your Primary MD to get all Hospital records sent to his/her office.   In some cases, they will be blood work, cultures and biopsy results pending at the time of your discharge. Please request that your primary care M.D. goes through all the records of your hospital data and follows up on these results.   Also Note the following: If you experience worsening of your admission symptoms, develop shortness of breath, life threatening emergency, suicidal or homicidal thoughts you must seek medical attention immediately by calling 911 or calling your MD  immediately  if symptoms less severe.   You must read complete instructions/literature along with all the possible adverse reactions/side effects for all the Medicines you take and that have been prescribed to you. Take any new Medicines after you have completely understood and accpet all the possible adverse reactions/side effects.    Do not drive when taking Pain medications or sleeping medications (Benzodaizepines)   Do not take more than prescribed Pain, Sleep and Anxiety Medications. It is not advisable to combine anxiety,sleep and pain medications without talking with your primary care practitioner   Special Instructions: If you have smoked or chewed Tobacco  in the last 2 yrs please stop smoking, stop any regular Alcohol  and or any  Recreational drug use.   Wear Seat belts while driving.   Please note: You were cared for by a hospitalist during your hospital stay. Once you are discharged, your primary care physician will handle any further medical issues. Please note that NO REFILLS for any discharge medications will be authorized once you are discharged, as it is imperative that you return to your primary care physician (or establish a relationship with a primary care physician if you do not have one) for your post hospital discharge needs so that they can reassess your need for medications and monitor your lab values.  Time coordinating discharge: 35 minutes  SIGNED:  Nilda Fendt, MD, PhD 05/05/2024, 2:11 PM

## 2024-05-05 NOTE — Plan of Care (Signed)
  Problem: Education: Goal: Knowledge of General Education information will improve Description: Including pain rating scale, medication(s)/side effects and non-pharmacologic comfort measures Outcome: Progressing   Problem: Health Behavior/Discharge Planning: Goal: Ability to manage health-related needs will improve Outcome: Progressing   Problem: Activity: Goal: Risk for activity intolerance will decrease Outcome: Progressing   Problem: Coping: Goal: Level of anxiety will decrease Outcome: Progressing   Problem: Clinical Measurements: Goal: Diagnostic test results will improve Outcome: Not Progressing

## 2024-05-05 NOTE — Progress Notes (Signed)
 Pt arrived back to 6 north room 8 from endo. Alert and oriented x4. No pain just mild cramping in lower abdomen area. Clear liquid tray ordered. Bed in lowest position. Call light in reach. All needs me at this time.  Per valente Lebron PEAK from ENDO:  EGD and Colonoscopy done; one area found in stomach that was cauterized and gastric biopsies taken to r/o hpylori; and area found in the ascending colon was cauterized and clip placed; 2 polyps removed in transverse colon and one in sigmoid colon; orders for advanced diet as tolerated in; resume eliquis  tomorrow and repeat cbc in am; propofol  for sedation 149/74 83 92 on RA; Gherghe,MD also made aware.

## 2024-05-05 NOTE — Op Note (Signed)
 [facility] Patient Name: Denise [name] Procedure Date : 05/05/2024 MRN: [id number] Attending MD: Sandor Flatter , MD, [id number] Date of Birth: 02/10/1961 CSN: [id number] Age: 63 Admit Type: Inpatient Procedure:                Upper GI endoscopy Indications:              Iron deficiency anemia, Hematochezia, Acute on                            chronic anemia, symptomatic anemia Providers:                Sandor Flatter, MD, Gregoria Pierce, RN, Fairy Marina, Technician Referring MD:              Medicines:                Monitored Anesthesia Care Complications:            No immediate complications. Estimated Blood Loss:     Estimated blood loss was minimal. Procedure:                Pre-Anesthesia Assessment:                           - Prior to the procedure, a History and Physical                            was performed, and patient medications and                            allergies were reviewed. The patient's tolerance of                            previous anesthesia was also reviewed. The risks                            and benefits of the procedure and the sedation                            options and risks were discussed with the patient.                            All questions were answered, and informed consent                            was obtained. Prior Anticoagulants: The patient has                            taken Eliquis  (apixaban ) and Plavix , last dose was                            5 days prior to procedure. ASA Grade Assessment:  III - A patient with severe systemic disease. After                            reviewing the risks and benefits, the patient was                            deemed in satisfactory condition to undergo the                            procedure.                           After obtaining informed consent, the endoscope was                            passed under  direct vision. Throughout the                            procedure, the patient's blood pressure, pulse, and                            oxygen saturations were monitored                            continuously.After obtaining informed consent, the                            endoscope was passed under direct vision.                            Throughout the procedure, the patient's blood                            pressure, pulse, and oxygen saturations were                            monitored continuously.The upper GI endoscopy was                            accomplished without difficulty. The patient                            tolerated the procedure well. The GIF-H190                            ([id number]) Olympus endoscope was introduced through                            the mouth, and advanced to the third part of                            duodenum. Scope In: Scope Out: Findings:      The esophagus was normal.      A single small angioectasia with no bleeding was found in the gastric  body. Coagulation for hemostasis using argon plasma was successful.       Estimated blood loss was minimal.      Patchy mild inflammation characterized by congestion (edema) and       erythema was found in the gastric body and in the gastric antrum.       Biopsies were taken with a cold forceps for Helicobacter pylori testing.       Estimated blood loss was minimal.      The examined duodenum was normal. Impression:               - Normal esophagus.                           - A single non-bleeding angioectasia in the                            stomach. Treated with argon plasma coagulation                            (APC).                           - Mild, non-ulcer gastritis. Biopsied.                           - Normal examined duodenum. Recommendation:           - Await pathology results.                           - Repeat upper endoscopy PRN for retreatment.                           -  Perform a colonoscopy today.                           - See colonoscopy report for additional                            recommendations regarding ongoing inpatient                            management. Procedure Code(s):        --- Professional ---                           43255, 59, Esophagogastroduodenoscopy, flexible,                            transoral; with control of bleeding, any method                           43239, Esophagogastroduodenoscopy, flexible,                            transoral; with biopsy, single or multiple Diagnosis Code(s):        --- Professional ---  K31.819, Angiodysplasia of stomach and duodenum                            without bleeding                           K29.70, Gastritis, unspecified, without bleeding                           D50.9, Iron deficiency anemia, unspecified                           K92.1, Melena (includes Hematochezia) CPT copyright 2022 American Medical Association. All rights reserved. The codes documented in this report are preliminary and upon coder review may  be revised to meet current compliance requirements. Sandor Flatter, MD 05/05/2024 8:50:33 AM Number of Addenda: 0

## 2024-05-05 NOTE — Transfer of Care (Signed)
 Immediate Anesthesia Transfer of Care Note  Patient: Denise [name]  Procedure(s) Performed: COLONOSCOPY EGD (ESOPHAGOGASTRODUODENOSCOPY) EGD, WITH ARGON PLASMA COAGULATION  Patient Location: PACU  Anesthesia Type:MAC  Level of Consciousness: awake and alert   Airway & Oxygen Therapy: Patient Spontanous Breathing and Patient connected to nasal cannula oxygen  Post-op Assessment: Report given to RN and Post -op Vital signs reviewed and stable  Post vital signs: Reviewed and stable  Last Vitals:  Vitals Value Taken Time  BP 120/63 05/05/24 08:31  Temp 36.6 C 05/05/24 08:31  Pulse 76 05/05/24 08:36  Resp 13 05/05/24 08:36  SpO2 95 % 05/05/24 08:36  Vitals shown include unfiled device data.  Last Pain:  Vitals:   05/05/24 0831  TempSrc: Temporal  PainSc: 0-No pain         Complications: No notable events documented.

## 2024-05-05 NOTE — Progress Notes (Signed)
 Discharge Nurse Summary: DC order noted per MD. DC RN at bedside with patient. Patient agreeable with discharge plan, states family will arrive soon for pickup.   AVS printed/reviewed. PIV removed, skin intact. No DME needs. No home meds. TOC meds delivered to the patient. CP/Edu resolved. Telemonitor returned to charging station. All belongings accounted for. Patient wheeled downstairs for discharge by private auto.   Rosario EMERSON Lund, RN

## 2024-05-05 NOTE — Anesthesia Postprocedure Evaluation (Signed)
 Anesthesia Post Note  Patient: Denise [name]  Procedure(s) Performed: COLONOSCOPY EGD (ESOPHAGOGASTRODUODENOSCOPY) EGD, WITH ARGON PLASMA COAGULATION POLYPECTOMY, INTESTINE     Patient location during evaluation: PACU Anesthesia Type: MAC Level of consciousness: awake and alert Pain management: pain level controlled Vital Signs Assessment: post-procedure vital signs reviewed and stable Respiratory status: spontaneous breathing, nonlabored ventilation and respiratory function stable Cardiovascular status: blood pressure returned to baseline and stable Postop Assessment: no apparent nausea or vomiting Anesthetic complications: no   No notable events documented.  Last Vitals:  Vitals:   05/05/24 0920 05/05/24 0939  BP: 126/84 126/84  Pulse: 71 71  Resp: 18   Temp: 36.8 C   SpO2: 92%     Last Pain:  Vitals:   05/05/24 0939  TempSrc:   PainSc: 0-No pain                 Butler Levander Pinal

## 2024-05-05 NOTE — Progress Notes (Signed)
 Spoke with Clotilda, RN and gave report on patient

## 2024-05-05 NOTE — Progress Notes (Signed)
 Patient drank all of her bowel prep before MN and tolerated it well. She has signed her consent and asked to have her infusion paused so that she can wash off this morning.

## 2024-05-05 NOTE — Progress Notes (Signed)
 Pt transported to ENDO before morning shift change.

## 2024-05-05 NOTE — Op Note (Signed)
 [facility] Patient Name: Denise [name] Procedure Date : 05/05/2024 MRN: [id number] Attending MD: Sandor Flatter , MD, [id number] Date of Birth: [birth year] CSN: [id number] Age: 63 Admit Type: Inpatient Procedure:                Colonoscopy Indications:              Hematochezia, Iron deficiency anemia, Acute on                            chronic anemia, Symptomatic anemia Providers:                Sandor Flatter, MD, Gregoria Pierce, RN, Fairy Marina, Technician Referring MD:              Medicines:                Monitored Anesthesia Care Complications:            No immediate complications. Estimated Blood Loss:     Estimated blood loss was minimal. Procedure:                Pre-Anesthesia Assessment:                           - Prior to the procedure, a History and Physical                            was performed, and patient medications and                            allergies were reviewed. The patient's tolerance of                            previous anesthesia was also reviewed. The risks                            and benefits of the procedure and the sedation                            options and risks were discussed with the patient.                            All questions were answered, and informed consent                            was obtained. Prior Anticoagulants: The patient has                            taken Eliquis  (apixaban ) and Plavix , last dose was                            5 days prior to procedure. ASA Grade Assessment:  III - A patient with severe systemic disease. After                            reviewing the risks and benefits, the patient was                            deemed in satisfactory condition to undergo the                            procedure.                           After obtaining informed consent, the colonoscope                            was passed under  direct vision. Throughout the                            procedure, the patient's blood pressure, pulse, and                            oxygen saturations were monitored continuously. The                            [id number]-HQ190L ([id number]) Olympus colonoscope was                            introduced through the anus and advanced to the the                            terminal ileum. The colonoscopy was performed                            without difficulty. The patient tolerated the                            procedure well. The quality of the bowel                            preparation was good. The terminal ileum, ileocecal                            valve, appendiceal orifice, and rectum were                            photographed. Scope In: 8:00:33 AM Scope Out: 8:25:07 AM Scope Withdrawal Time: 0 hours 20 minutes 52 seconds  Total Procedure Duration: 0 hours 24 minutes 34 seconds  Findings:      The perianal and digital rectal examinations were normal.      A single small angioectasia with typical arborization was found in the       ascending colon. Coagulation for hemostasis using argon plasma was       successful. Given appearance, location, and the need to resume       anticoagulation, I elected  for dual endoscopic therapy and additionally       placed 1 hemostatic clip with good approximation of tissue. Clip       manufacturer: [employer]. There was no bleeding at the end of the       procedure.      Two sessile polyps were found in the transverse colon. The polyps were 4       to 6 mm in size. These polyps were removed with a cold snare. Resection       and retrieval were complete. Estimated blood loss was minimal.      A 4 mm polyp was found in the sigmoid colon. The polyp was sessile. The       polyp was removed with a cold snare. Resection and retrieval were       complete. Estimated blood loss was minimal.      Non-bleeding internal hemorrhoids were found during  retroflexion. The       hemorrhoids were small.      The terminal ileum appeared normal. Impression:               - A single colonic angioectasia. Treated with argon                            plasma coagulation (APC). Clip (MR conditional) was                            placed. Clip manufacturer: [employer].                           - Two 4 to 6 mm polyps in the transverse colon,                            removed with a cold snare. Resected and retrieved.                           - One 4 mm polyp in the sigmoid colon, removed with                            a cold snare. Resected and retrieved.                           - Non-bleeding internal hemorrhoids.                           - The examined portion of the ileum was normal. Recommendation:           - Return patient to hospital ward for ongoing care.                           - Advance diet as tolerated.                           - Resume Eliquis  (apixaban ) at prior dose tomorrow.                           - Await pathology results.                           -  Repeat CBC check in the morning.                           - If recurrent anemia or concern for                            ongoing/recurrent GI bleed, recommend further small                            bowel interrogation with Video Capsule Endoscopy. Procedure Code(s):        --- Professional ---                           [phone], 59, Colonoscopy, flexible; with control of                            bleeding, any method                           45385, Colonoscopy, flexible; with removal of                            tumor(s), polyp(s), or other lesion(s) by snare                            technique Diagnosis Code(s):        --- Professional ---                           K55.20, Angiodysplasia of colon without hemorrhage                           D12.3, Benign neoplasm of transverse colon (hepatic                            flexure or splenic flexure)                            D12.5, Benign neoplasm of sigmoid colon                           K64.8, Other hemorrhoids                           K92.1, Melena (includes Hematochezia)                           D50.9, Iron deficiency anemia, unspecified CPT copyright 2022 American Medical Association. All rights reserved. The codes documented in this report are preliminary and upon coder review may  be revised to meet current compliance requirements. Sandor Flatter, MD 05/05/2024 8:45:51 AM Number of Addenda: 0

## 2024-05-05 NOTE — Anesthesia Preprocedure Evaluation (Addendum)
 Anesthesia Evaluation  Patient identified by MRN, date of birth, ID band Patient awake    Reviewed: Allergy & Precautions, H&P , NPO status , Patient's Chart, lab work & pertinent test results  Airway Mallampati: II   Neck ROM: full    Dental  (+) Dental Advisory Given   Pulmonary neg pulmonary ROS, COPD, Patient abstained from smoking., former smoker   breath sounds clear to auscultation       Cardiovascular hypertension, Pt. on medications + Peripheral Vascular Disease   Rhythm:regular Rate:Normal     Neuro/Psych  Headaches  Anxiety Depression     negative psych ROS   GI/Hepatic negative GI ROS, Neg liver ROS,,,  Endo/Other  negative endocrine ROS    Renal/GU negative Renal ROS  negative genitourinary   Musculoskeletal  (+) Arthritis ,    Abdominal   Peds negative pediatric ROS (+)  Hematology negative hematology ROS (+) Blood dyscrasia, anemia   Anesthesia Other Findings   Reproductive/Obstetrics negative OB ROS                              Anesthesia Physical Anesthesia Plan  ASA: III  Anesthesia Plan: MAC   Post-op Pain Management:    Induction: Intravenous  PONV Risk Score and Plan: 2 and Propofol  infusion and Treatment may vary due to age or medical condition  Airway Management Planned: Nasal Cannula  Additional Equipment:   Intra-op Plan:   Post-operative Plan:   Informed Consent: I have reviewed the patients History and Physical, chart, labs and discussed the procedure including the risks, benefits and alternatives for the proposed anesthesia with the patient or authorized representative who has indicated his/her understanding and acceptance.       Plan Discussed with: CRNA and Anesthesiologist  Anesthesia Plan Comments:          Anesthesia Quick Evaluation

## 2024-05-05 NOTE — Interval H&P Note (Signed)
 History and Physical Interval Note:  05/05/2024 7:30 AM  Denise [name]  has presented today for surgery, with the diagnosis of iron deficiency anemia, gastrointestinal bleeding.  The various methods of treatment have been discussed with the patient and family. After consideration of risks, benefits and other options for treatment, the patient has consented to  Procedure(s): COLONOSCOPY (N/A) EGD (ESOPHAGOGASTRODUODENOSCOPY) (N/A) as a surgical intervention.  The patient's history has been reviewed, patient examined, no change in status, stable for surgery.  I have reviewed the patient's chart and labs.  Questions were answered to the patient's satisfaction.     Sandor GAILS Caellum Mancil

## 2024-05-05 NOTE — Discharge Instructions (Signed)
Follow with Doreene Nest, NP in 5-7 days  Please get a complete blood count and chemistry panel checked by your Primary MD at your next visit, and again as instructed by your Primary MD. Please get your medications reviewed and adjusted by your Primary MD.  Please request your Primary MD to go over all Hospital Tests and Procedure/Radiological results at the follow up, please get all Hospital records sent to your Prim MD by signing hospital release before you go home.  In some cases, there will be blood work, cultures and biopsy results pending at the time of your discharge. Please request that your primary care M.D. goes through all the records of your hospital data and follows up on these results.  If you had Pneumonia of Lung problems at the Hospital: Please get a 2 view Chest X ray done in 6-8 weeks after hospital discharge or sooner if instructed by your Primary MD.  If you have Congestive Heart Failure: Please call your Cardiologist or Primary MD anytime you have any of the following symptoms:  1) 3 pound weight gain in 24 hours or 5 pounds in 1 week  2) shortness of breath, with or without a dry hacking cough  3) swelling in the hands, feet or stomach  4) if you have to sleep on extra pillows at night in order to breathe  Follow cardiac low salt diet and 1.5 lit/day fluid restriction.  If you have diabetes Accuchecks 4 times/day, Once in AM empty stomach and then before each meal. Log in all results and show them to your primary doctor at your next visit. If any glucose reading is under 80 or above 300 call your primary MD immediately.  If you have Seizure/Convulsions/Epilepsy: Please do not drive, operate heavy machinery, participate in activities at heights or participate in high speed sports until you have seen by Primary MD or a Neurologist and advised to do so again. Per [facility] statutes, patients with seizures are not allowed to drive until they have been  seizure-free for six months.  Use caution when using heavy equipment or power tools. Avoid working on ladders or at heights. Take showers instead of baths. Ensure the water temperature is not too high on the home water heater. Do not go swimming alone. Do not lock yourself in a room alone (i.e. bathroom). When caring for infants or small children, sit down when holding, feeding, or changing them to minimize risk of injury to the child in the event you have a seizure. Maintain good sleep hygiene. Avoid alcohol.   If you had Gastrointestinal Bleeding: Please ask your Primary MD to check a complete blood count within one week of discharge or at your next visit. Your endoscopic/colonoscopic biopsies that are pending at the time of discharge, will also need to followed by your Primary MD.  Get Medicines reviewed and adjusted. Please take all your medications with you for your next visit with your Primary MD  Please request your Primary MD to go over all hospital tests and procedure/radiological results at the follow up, please ask your Primary MD to get all Hospital records sent to his/her office.  If you experience worsening of your admission symptoms, develop shortness of breath, life threatening emergency, suicidal or homicidal thoughts you must seek medical attention immediately by calling 911 or calling your MD immediately  if symptoms less severe.  You must read complete instructions/literature along with all the possible adverse reactions/side effects for all the Medicines you  take and that have been prescribed to you. Take any new Medicines after you have completely understood and accpet all the possible adverse reactions/side effects.   Do not drive or operate heavy machinery when taking Pain medications.   Do not take more than prescribed Pain, Sleep and Anxiety Medications  Special Instructions: If you have smoked or chewed Tobacco  in the last 2 yrs please stop smoking, stop any regular  Alcohol  and or any Recreational drug use.  Wear Seat belts while driving.  Please note You were cared for by a hospitalist during your hospital stay. If you have any questions about your discharge medications or the care you received while you were in the hospital after you are discharged, you can call the unit and asked to speak with the hospitalist on call if the hospitalist that took care of you is not available. Once you are discharged, your primary care physician will handle any further medical issues. Please note that NO REFILLS for any discharge medications will be authorized once you are discharged, as it is imperative that you return to your primary care physician (or establish a relationship with a primary care physician if you do not have one) for your aftercare needs so that they can reassess your need for medications and monitor your lab values.  You can reach the hospitalist office at phone [phone] or fax [phone]   If you do not have a primary care physician, you can call [phone] for a physician referral.  Activity: As tolerated with Full fall precautions use walker/cane & assistance as needed    Diet: regular  Disposition Home

## 2024-05-06 ENCOUNTER — Telehealth: Payer: Self-pay

## 2024-05-06 LAB — SURGICAL PATHOLOGY

## 2024-05-06 NOTE — Transitions of Care (Post Inpatient/ED Visit) (Signed)
   05/06/2024  Name: Denise [name] MRN: [id number] DOB: 04-13-1961  Today's TOC FU Call Status: Today's TOC FU Call Status:: Unsuccessful Call (1st Attempt) Unsuccessful Call (1st Attempt) Date: 05/06/24  Attempted to reach the patient regarding the most recent Inpatient/ED visit.  Follow Up Plan: Additional outreach attempts will be made to reach the patient to complete the Transitions of Care (Post Inpatient/ED visit) call.   Arvin Seip RN, BSN, CCM [employer], Population Health Case Manager Phone: [phone]

## 2024-05-07 ENCOUNTER — Encounter (HOSPITAL_COMMUNITY): Payer: Self-pay | Admitting: Gastroenterology

## 2024-05-07 ENCOUNTER — Telehealth: Payer: Self-pay | Admitting: *Deleted

## 2024-05-07 NOTE — Transitions of Care (Post Inpatient/ED Visit) (Signed)
   05/07/2024  Name: Denise [name] MRN: [id number] DOB: [birth year]  Today's TOC FU Call Status: Today's TOC FU Call Status:: Unsuccessful Call (2nd Attempt) Unsuccessful Call (2nd Attempt) Date: 05/07/24  Attempted to reach the patient regarding the most recent Inpatient/ED visit.  Follow Up Plan: Additional outreach attempts will be made to reach the patient to complete the Transitions of Care (Post Inpatient/ED visit) call.   Andrea Dimes RN, BSN [place]  [facility] RN Care Manager [phone]

## 2024-05-07 NOTE — Telephone Encounter (Signed)
 This encounter was created in error - please disregard.

## 2024-05-08 ENCOUNTER — Telehealth: Payer: Self-pay

## 2024-05-08 NOTE — Transitions of Care (Post Inpatient/ED Visit) (Signed)
   05/08/2024  Name: Denise [name] MRN: [id number] DOB: 1961-04-23  Today's TOC FU Call Status: Today's TOC FU Call Status:: Unsuccessful Call (3rd Attempt) Unsuccessful Call (1st Attempt) Date: 05/06/24 Unsuccessful Call (2nd Attempt) Date: 05/07/24 Unsuccessful Call (3rd Attempt) Date: 05/08/24  Attempted to reach the patient regarding the most recent Inpatient/ED visit.  Follow Up Plan: No further outreach attempts will be made at this time. We have been unable to contact the patient.  Medford Balboa, BSN, RN [place]  VBCI - [employer] [facility] RN Care Manager [phone]

## 2024-05-13 ENCOUNTER — Ambulatory Visit: Payer: Self-pay | Admitting: Gastroenterology

## 2024-05-13 DIAGNOSIS — I480 Paroxysmal atrial fibrillation: Secondary | ICD-10-CM | POA: Diagnosis not present

## 2024-05-17 ENCOUNTER — Ambulatory Visit: Payer: Self-pay | Admitting: Cardiology

## 2024-05-17 NOTE — Progress Notes (Unsigned)
 Cardiology Office Note    Date:  05/18/2024  ID:  [name], DOB 04-07-1961, MRN [id number] PCP:  Denise Comer POUR, NP  Cardiologist:  Denise Bruckner, MD  Electrophysiologist:  None   Chief Complaint: Follow up for atrial fibrillation, CAD   History of Present Illness: .    [name] is a 63 y.o. female with visit-pertinent history of nonobstructive CAD per coronary CTA in 10/2023, hyperdynamic LV systolic function with a EF of 70 to 75% on echo on 10/15/2023, carotid artery disease, hypertension, hyperlipidemia, obesity history of cigarette smoking, SIADH.   [name] was originally seen by Dr. Alveta in 08/2023 for preoperative evaluation in preparation for carotid surgery.  Coronary CTA in 10/2023 with CAC 659, 90th percentile, severe mixed plaque in the proximal LAD with 79%, 25 to 49% in LCx/RCA, severe lipomatous hypertrophy of the anterior atrial septum, FFR negative.  Echo in 10/2023 showed EF 70-75%, moderate LVH, moderate LAE, mild AI, moderate AS.  There was also a lung nodule being followed by pulmonology, bronc negative for malignant cells but plan for surveillance PET in 03/2024.  Patient was initially planned to have her right carotid surgery earlier this year then had admission for complex traumatic mechanical fall resulting in multiple fractures.  Patient also known to have abnormal ABIs.  In 2024 she had blurry vision in the right eye, carotid duplex suggested high-grade stenosis bilaterally, prompting CTA to confirm the same.  Follow-up duplex in 6/25 showed right carotid totally occluded, LICA was 80 to 99%.  On 02/12/2024 patient was brought in for left TCAR with plan for medical therapy on the right.  Per discussion with PACU nurse she was reported to be in trigeminy during the case and in bigeminy in PACU with sinus tachycardia.  Heart rate in improved to the 80s and ectopy resolved.  Patient denied any chest tightness or worsening dyspnea, noted to have  chronic baseline shortness of breath.  On 02/13/2024 patient went into atrial fibrillation and was started on metoprolol , was in and out of atrial fibrillation overnight.  Patient was cardiac unaware.  Patient was discharged on 02/14/2024.    On 02/16/2024 patient presented to [name] for rectal bleeding, lab work revealed normal hemoglobin.  It was recommended she undergo CTA with GI consult and admission however patient declined.   On 02/18/2024 patient presented back to the emergency department with symptomatic GI bleed, she had been discharged on aspirin , Plavix  and Eliquis .  Patient was found to be in atrial fibrillation with RVR, was started on amiodarone  to attempt to maintain sinus rhythm.  Patient's blood pressure was 220/110 and 160/80.  GI was consulted with no recommendation for intervention as hemoglobin was stable.  Patient was continued on Plavix  75 mg daily however her aspirin  and Eliquis  were held.  Patient was started on amiodarone  200 mg twice daily for 14 days then 200 mg daily thereafter.  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   On 02/21/2024 patient returned to [name] ED for altered mental status and near syncope with potential seizure.  Patient was observed to experience tonic-clonic seizure by witnesses.  Patient was admitted for further evaluation.  MRI brain and EEG were without acute findings.  Her lisinopril  was resumed and metoprolol  was continued.  Amlodipine  was held.  Echo on 02/22/2024 indicated moderate aortic valve stenosis, and notes it was stated that prior study was performed during atrial fibrillation, exaggerating measured aortic valve stenosis gradients, aortic stenosis was  noted to be moderate not severe.  Patient had no further seizures and symptoms resolved, she was discharged home on 02/23/2024 with recommendations for neurology follow-up and no driving.  Patient was seen in clinic on 02/23/2024 for follow-up.  Patient reported that she been having significant anxiety following her [name] admission.  She reported that her blood pressure been elevated at doctors offices however reported that she is not checking at home and she is concerned will be elevated which results in her increased anxiety.  She reported occasional fleeting chest pain that had been previously reported and not felt to be ischemic in nature.  Patient endorsed a feeling of slight fluttering for a few seconds, denied any sustained palpitations.  She denied any further GI bleeding.  Patient was agreeable to wearing a 1 week cardiac monitor to assess for atrial fibrillation and determine burden of PVCs.  Patient's preliminary results from her cardiac monitor showed an average heart rate of 70 bpm, ranging from 53 to 103 bpm, she occasional PVCs at 4.7% burden.  Patient notes that she only wore the monitor for 1 day and 17 hours as unfortunately when she applied it it was near her scar from her recent surgery and was painful, when she went to move the device it unfortunately fell off.  Patient was last seen in clinic on 9//2025 for follow-up.  She noted that she was doing okay, reported headaches were intermittently present however improved.  She noted a sharp fleeting pain in her chest that quickly resolved and did not does not associate with exertion, she did note 1 episode of chest discomfort associate exertion that resolved with rest denied any recurrence.  Patient reported she had not been checking her blood pressure at home as it resulted in significant anxiety.  She noted ongoing shortness of breath which she felt was likely related to her COPD, denied any significant changes.  She denied any bleeding  problems at that time.  After discussion with Dr. Lonni, patient was started on Eliquis  5 mg twice daily and aspirin  81 mg daily, her Plavix  was discontinued.  On 05/02/2024 patient presented to the emergency department for blood in her stool.  Patient reported been ongoing for a few weeks, was referred to GI as an outpatient but developed worsening fatigue, weakness, shortness of breath and decided to come to the [name].  Was found to have a hemoglobin of 8 from baseline of 13.  Patient received 2 units of RBCs, GI did an EGD and colonoscopy, EGD showed single gastric AVM treated with APC also noted to have mild gastritis.  Colonoscopy with single AVM in the acing colon treated with APC and 1 hemostatic clip, 3 small polyps all removed and small internal hemorrhoids.  Patient was instructed to resume Eliquis  and Plavix  a day following her discharge and to follow-up with PCP.  Today she presents for follow-up.  She reports that she is doing well overall following her [name] discharge.  She notes that she does currently have a cold with increased nasal congestion which has slightly worsened her breathing, notes a slight cough.  She denies any fevers, lower extremity edema, orthopnea or PND.  She plans to follow-up with her PCP later this week.  Patient notes  an occasional sharp pain in the left abdomen however notes that with a position change this resolves, also notes that this is associated with increased belching.  She denies any significant chest pain on exertion.  Patient reports that she has not had any further blood in her stool, notes that her stool is dark however reports that she has continued on iron supplement and with previous GI bleeding noted frank red blood, has not seen since restarting on Plavix  and Eliquis .  She does note occasional fleeting palpitations lasting for a few seconds described as a skipped beat or slight flutter, denies any sustained palpitations.  She denies any  dizziness, lightheadedness, presyncope or syncope.  Discussed checking lab work today for ongoing monitoring however patient deferred in favor of having this rechecked at her PCPs office later this week. ROS: .   Today she denies lower extremity edema, fatigue, melena, hematuria, hemoptysis, diaphoresis, weakness, presyncope, syncope, orthopnea, and PND.  All other systems are reviewed and otherwise negative. Studies Reviewed: SABRA   EKG:  EKG is not ordered today.  CV Studies: Cardiac studies reviewed are outlined and summarized above. Otherwise please see EMR for full report. Cardiac Studies & Procedures   ______________________________________________________________________________________________     ECHOCARDIOGRAM  ECHOCARDIOGRAM COMPLETE 02/22/2024  Narrative ECHOCARDIOGRAM REPORT    Patient Name:   [name] Date of Exam: 02/22/2024 Medical Rec #:  [id number]           Height:       63.0 in Accession #:    [id number]          Weight:       159.2 lb Date of Birth:  [birth year]            BSA:          1.755 m Patient Age:    63 years            BP:           170/78 mmHg Patient Gender: F                   HR:           63 bpm. Exam Location:  Inpatient  Procedure: 2D Echo, Cardiac Doppler and Color Doppler (Both Spectral and Color Flow Doppler were utilized during procedure).  Indications:    Syncope R55  History:        Patient has prior history of Echocardiogram examinations, most recent 02/18/2024. Cardiomegaly, CAD, COPD, Arrythmias:Atrial Flutter, Signs/Symptoms:Dyspnea; Risk Factors:Hypertension, Dyslipidemia and Current Smoker.  Sonographer:    Thea Norlander RCS Referring Phys: [phone] [name]   Sonographer Comments: No parasternal window. IMPRESSIONS   1. Left ventricular ejection fraction, by estimation, is 60 to 65%. The left ventricle has normal function. The left ventricle has no regional wall motion abnormalities. There is mild concentric  left ventricular hypertrophy. Left ventricular diastolic parameters are consistent with Grade I diastolic dysfunction (impaired relaxation). 2. Right ventricular systolic function is normal. The right ventricular size is normal. Tricuspid regurgitation signal is inadequate for assessing PA pressure. 3. Left atrial size was mild to moderately dilated. 4. The mitral valve is degenerative. Mild mitral valve regurgitation. No evidence of mitral stenosis. Moderate mitral annular calcification. 5. The aortic valve is calcified. Aortic valve regurgitation is mild. Moderate aortic valve stenosis. Aortic valve mean gradient measures 24.0 mmHg. Aortic valve Vmax measures 3.32 m/s. Aortic valve acceleration time measures 83 msec.  Comparison(s): No significant change from prior  study. Prior images reviewed side by side. Prior study was performed during atrial fibrillation, exaggerating the measured aortic valve stenosis gradients. Aortic stenosis is definitely present, but is not severe.  FINDINGS Left Ventricle: Left ventricular ejection fraction, by estimation, is 60 to 65%. The left ventricle has normal function. The left ventricle has no regional wall motion abnormalities. The left ventricular internal cavity size was normal in size. There is mild concentric left ventricular hypertrophy. Left ventricular diastolic parameters are consistent with Grade I diastolic dysfunction (impaired relaxation). Normal left ventricular filling pressure.  Right Ventricle: The right ventricular size is normal. No increase in right ventricular wall thickness. Right ventricular systolic function is normal. Tricuspid regurgitation signal is inadequate for assessing PA pressure.  Left Atrium: Left atrial size was mild to moderately dilated.  Right Atrium: Right atrial size was normal in size.  Pericardium: There is no evidence of pericardial effusion.  Mitral Valve: The mitral valve is degenerative in appearance. Moderate  mitral annular calcification. Mild mitral valve regurgitation. No evidence of mitral valve stenosis.  Tricuspid Valve: The tricuspid valve is normal in structure. Tricuspid valve regurgitation is trivial.  Aortic Valve: The aortic valve is calcified. Aortic valve regurgitation is mild. Moderate aortic stenosis is present. Aortic valve mean gradient measures 24.0 mmHg. Aortic valve peak gradient measures 44.0 mmHg. Aortic valve area, by VTI measures 0.98 cm.  Pulmonic Valve: The pulmonic valve was not well visualized.  Aorta: The aortic root was not well visualized.  IAS/Shunts: No atrial level shunt detected by color flow Doppler.   LEFT VENTRICLE PLAX 2D LVOT diam:     2.00 cm   Diastology LV SV:         68        LV e' medial:    6.96 cm/s LV SV Index:   39        LV E/e' medial:  13.5 LVOT Area:     3.14 cm  LV e' lateral:   7.72 cm/s LV E/e' lateral: 12.2   RIGHT VENTRICLE             IVC RV S prime:     13.80 cm/s  IVC diam: 1.60 cm TAPSE (M-mode): 1.9 cm  LEFT ATRIUM             Index        RIGHT ATRIUM           Index LA Vol (A2C):   58.8 ml 33.51 ml/m  RA Area:     16.05 cm LA Vol (A4C):   37.6 ml 21.43 ml/m  RA Volume:   36.25 ml  20.66 ml/m LA Biplane Vol: 49.2 ml 28.04 ml/m AORTIC VALVE AV Area (Vmax):    0.96 cm AV Area (Vmean):   0.91 cm AV Area (VTI):     0.98 cm AV Vmax:           331.50 cm/s AV Vmean:          225.000 cm/s AV VTI:            0.690 m AV Peak Grad:      44.0 mmHg AV Mean Grad:      24.0 mmHg LVOT Vmax:         101.00 cm/s LVOT Vmean:        65.500 cm/s LVOT VTI:          0.216 m LVOT/AV VTI ratio: 0.31  MITRAL VALVE  TRICUSPID VALVE MV Area (PHT): 2.83 cm     TR Peak grad:   18.7 mmHg MV Decel Time: 268 msec     TR Vmax:        216.00 cm/s MV E velocity: 93.80 cm/s MV A velocity: 122.00 cm/s  SHUNTS MV E/A ratio:  0.77         Systemic VTI:  0.22 m Systemic Diam: 2.00 cm  Mihai Croitoru MD Electronically  signed by Jerel Balding MD Signature Date/Time: 02/22/2024/12:26:33 PM    Final    MONITORS  LONG TERM MONITOR (3-14 DAYS) 03/16/2024  Narrative Patch Wear Time:  1 days and 17 hours (2025-07-27T20:43:51-0400 to 2025-07-29T14:36:44-0400)  Patient had a min HR of 53 bpm, max HR of 103 bpm, and avg HR of 70 bpm. Predominant underlying rhythm was Sinus Rhythm. Isolated SVEs were occasional (4.7%). No VT, SVT, atrial fibrillation, high degree block, or pauses noted. Isolated ventricular ectopy was rare (<1%). There were 3 triggered events, which were sinus rhythm. No high risk arrhythmias detected.   CT SCANS  CT CORONARY MORPH W/CTA COR W/SCORE 10/11/2023  Addendum 10/26/2023  5:22 AM ADDENDUM REPORT: 10/26/2023 05:20  EXAM: OVER-READ INTERPRETATION  CT CHEST  The following report is an over-read performed by radiologist Dr. VEAR Frederic [name] on 10/26/2023. This over-read does not include interpretation of cardiac or coronary anatomy or pathology. The coronary CTA interpretation by the cardiologist is attached.  COMPARISON:  Cardiac CTA 04/29/2023.  FINDINGS: Lobulated superior segment left lower lobe lung nodule redemonstrated, now up to 16 mm long axis and has enlarged by about 1 mm since September. Underlying centrilobular emphysema which was more apparent on the previous exam.  Otherwise stable visible lungs and chest. No hilar or mediastinal lymphadenopathy identified. Cardiac findings detailed separately.  IMPRESSION: Slowly enlarging left lower lobe lung nodule, which was evaluated with PET-CT in 2022, but considering increased risk factors such as underlying Emphysema (ICD10-J43.9), Recommend referral to [name]) if not already done, and additional evaluation to exclude a slow growing carcinoma.  These results will be called to the ordering clinician or representative by the Radiologist Assistant, and  communication documented in the PACS or [employer].   Electronically Signed By: VEAR Hurst M.D. On: 10/26/2023 05:20  Narrative CLINICAL DATA:  Chest pain  EXAM: Cardiac/Coronary CTA  TECHNIQUE: A non-contrast, gated CT scan was obtained with axial slices of 3 mm through the heart for calcium  scoring. Calcium  scoring was performed using the Agatston method. A 120 kV prospective, gated, contrast cardiac scan was obtained. Gantry rotation speed was 250 msecs and collimation was 0.6 mm. Two sublingual nitroglycerin  tablets (0.8 mg) were given. The 3D data set was reconstructed in 5% intervals of the 35-75% of the R-R cycle. Diastolic phases were analyzed on a dedicated workstation using MPR, MIP, and VRT modes. The patient received 95 cc of contrast.  FINDINGS: Image quality: Excellent.  Noise artifact is: Limited.  Coronary Arteries:  Normal coronary origin.  Right dominance.  Left main: The left main is a large caliber vessel with a normal take off from the left coronary cusp that trifurcates to form a left anterior descending artery, ramus, and a left circumflex artery. There is no plaque or stenosis.  Left anterior descending artery: The proximal LAD contains severe mixed density plaque (70-99%). The mid LAD contains mild calcified plaque (25-49%). The distal LAD is patent. D1 contains mild calcified plaque (25-49%)  Ramus intermedius: Patent with no  evidence of plaque or stenosis.  Left circumflex artery: The LCX is non-dominant. There is mild mixed density plaque (25-49%) in the proximal and mid segments. The LCX gives off 2 patent obtuse marginal branches.  Right coronary artery: The RCA is dominant with normal take off from the right coronary cusp. There is mild mixed density plaque in the proximal, mid, and distal segments (25-49%). The RCA terminates as a PDA and right posterolateral branch without evidence of plaque or stenosis.  Right Atrium: Right  atrial size is within normal limits.  Right Ventricle: The right ventricular cavity is within normal limits.  Left Atrium: Left atrial size is normal in size with no left atrial appendage filling defect. Severe lipomatous hypertrophy of the interatrial septum extending into the RA, correlate with echocardiography.  Left Ventricle: The ventricular cavity size is within normal limits.  Pulmonary arteries: Normal in size.  Pulmonary veins: Normal pulmonary venous drainage.  Pericardium: Normal thickness without significant effusion or calcium  present.  Cardiac valves: The aortic valve is trileaflet without significant calcification. The mitral valve is normal with mild annular calcium .  Aorta: Normal caliber without significant disease.  Extra-cardiac findings: See attached radiology report for non-cardiac structures.  IMPRESSION: 1. Coronary calcium  score of 659. This was 98th percentile for age-, sex, and race-matched controls.  2. Total plaque volume 708 mm3 which is 91st percentile for age- and sex-matched controls (calcified plaque 152 mm3; non-calcified plaque 556 mm3). TPV is severe.  3. Normal coronary origin with right dominance.  4. Severe mixed density plaque in the proximal LAD (70-99%).  5. Mild mixed density plaque in the LCX (25-49%).  6. Mild mixed density plaque in the RCA (25-49%).  7. Severe lipomatous hypertrophy of the interatrial septum extending into the RA, correlate with echocardiography.  RECOMMENDATIONS: 1. CAD-RADS 4: Severe stenosis. CT FFR will be submitted. Consider symptom-guided anti-ischemic pharmacotherapy as well as risk factor modification per guideline directed care. Invasive coronary angiography recommended with revascularization per published guideline statements.  Darryle Decent, MD  Electronically Signed: By: Darryle Decent M.D. On: 10/13/2023 19:25   CT SCANS  CT CARDIAC SCORING (SELF PAY ONLY) 04/29/2023  Addendum  05/13/2023 12:35 PM ADDENDUM REPORT: 05/13/2023 12:32  EXAM: OVER-READ INTERPRETATION  CT CHEST  The following report is an over-read performed by radiologist Dr. Toribio Cove [name] on 05/13/2023. This over-read does not include interpretation of cardiac or coronary anatomy or pathology. The coronary calcium  score interpretation by the cardiologist is attached.  COMPARISON:  Chest CT 04/24/2021.  FINDINGS: Atherosclerotic calcifications in the thoracic aorta. Diffuse bronchial wall thickening with moderate centrilobular and mild paraseptal emphysema. Again noted is a macrolobulated nodule in the superior segment of the left lower lobe (axial image 9 of series 10) which has increased in size and currently measures 1.5 x 1.4 cm (previously 1.1 x 1.0 cm on 04/24/2021). This may have some internal fatty attenuation, but no calcifications. Within the visualized portions of the thorax there are no other new suspicious appearing pulmonary nodules or masses, there is no acute consolidative airspace disease, no pleural effusions, no pneumothorax and no lymphadenopathy. Visualized portions of the upper abdomen are unremarkable. There are no aggressive appearing lytic or blastic lesions noted in the visualized portions of the skeleton.  IMPRESSION: 1. Enlarging left lower lobe pulmonary nodule. Given the slow enlargement compared to prior examinations and lack of substantial hypermetabolism on prior PET-CT (as well as the possibility of internal fatty attenuation), the possibility of a benign lesion  such as a hamartoma or slow growing neoplasm such as a carcinoid tumor is favored, although slow growing primary bronchogenic adenocarcinoma is not excluded (but not favored based on the morphology of the lesion). 2. Aortic Atherosclerosis (ICD10-I70.0) and Emphysema (ICD10-J43.9).   Electronically Signed By: Toribio Aye M.D. On: 05/13/2023  12:32  Narrative CLINICAL DATA:  Risk stratification  EXAM: Coronary Calcium  Score  TECHNIQUE: The patient was scanned on a Siemens Somatom scanner. Axial non-contrast 3 mm slices were carried out through the heart. The data set was analyzed on a dedicated work station and scored using the Agatson method.  FINDINGS: Non-cardiac: See separate report from [name].  Ascending Aorta: Normal size, aortic wall calcifications  Pericardium: Normal  Coronary arteries: Normal origin of left and right coronary arteries. Distribution of arterial calcifications if present, as noted below;  LM 0  LAD 241  LCx 26.2  RCA 258  Total 526  IMPRESSION AND RECOMMENDATION: 1. Coronary calcium  score of 526. This was 98th percentile for age and sex matched control.  2. CAC >300 in LAD, LCx, RCA. CAC-DRS A3/N3.  3. Recommend aspirin  and statin if no contraindication.  4. Recommend cardiology consultation.  5. Continue heart healthy lifestyle and risk factor modification.  Electronically Signed: By: Redell Cave M.D. On: 04/29/2023 15:38     ______________________________________________________________________________________________       Current Reported Medications:.    Current Meds  Medication Sig   acetaminophen  (TYLENOL ) 500 MG tablet Take 2 tablets (1,000 mg total) by mouth every 8 (eight) hours as needed.   albuterol  (VENTOLIN  HFA) 108 (90 Base) MCG/ACT inhaler INHALE 2 PUFFS INTO THE LUNGS EVERY 4-6 HOURS AS NEEDED FOR TIGHTNESS/WHEEZING   amiodarone  (PACERONE ) 200 MG tablet Take 1 tablet (200 mg total) by mouth daily.   amLODipine  (NORVASC ) 10 MG tablet Take 1 tablet (10 mg total) by mouth daily.   apixaban  (ELIQUIS ) 5 MG TABS tablet Take 1 tablet (5 mg total) by mouth 2 (two) times daily.   Ascorbic Acid  (VITAMIN C ) 1000 MG tablet Take 2,000 mg by mouth daily.   atorvastatin  (LIPITOR ) 80 MG tablet TAKE 1 TABLET (80 MG TOTAL) BY MOUTH DAILY FOR  CHOLESTEROL   busPIRone  (BUSPAR ) 10 MG tablet Take 1 tablet (10 mg total) by mouth 2 (two) times daily. For anxiety   clopidogrel  (PLAVIX ) 75 MG tablet Take 75 mg by mouth daily.   escitalopram  (LEXAPRO ) 20 MG tablet TAKE 1 TABLET BY MOUTH DAILY FOR ANXIETY   ferrous sulfate  325 (65 FE) MG tablet Take 1 tablet (325 mg total) by mouth 2 (two) times daily.   fluticasone -salmeterol (WIXELA INHUB) 250-50 MCG/ACT AEPB Inhale 1 puff into the lungs in the morning and at bedtime.   lisinopril  (ZESTRIL ) 30 MG tablet TAKE 1 TABLET (30 MG TOTAL) BY MOUTH DAILY FOR BLOOD PRESSURE.   metoprolol  tartrate (LOPRESSOR ) 50 MG tablet Take 1 tablet (50 mg total) by mouth 2 (two) times daily.   pantoprazole  (PROTONIX ) 40 MG tablet Take 1 tablet (40 mg total) by mouth daily. For heartburn   pyridoxine  (B-6) 100 MG tablet Take 100 mg by mouth daily.   sodium chloride  1 g tablet TAKE 1 TABLET (1 G TOTAL) BY MOUTH 2 (TWO) TIMES DAILY WITH A MEAL. TO INCREASE SODIUM   vitamin B-12 (CYANOCOBALAMIN ) 1000 MCG tablet Take 1,000 mcg by mouth daily.   vitamin E  180 MG (400 UNITS) capsule Take 400 Units by mouth daily.    Physical Exam:    VS:  BP 130/68   Pulse 64   Ht 5' 3 (1.6 m)   Wt 166 lb (75.3 kg)   SpO2 95%   BMI 29.41 kg/m    Wt Readings from Last 3 Encounters:  05/18/24 166 lb (75.3 kg)  05/02/24 159 lb 13.3 oz (72.5 kg)  04/17/24 160 lb (72.6 kg)    GEN: Well nourished, well developed in no acute distress NECK: No JVD; No carotid bruits CARDIAC: RRR, 2/6 systolic murmur, no rubs or gallops RESPIRATORY:  Clear to auscultation without rales, wheezing or rhonchi  ABDOMEN: Soft, non-tender, non-distended EXTREMITIES:  No edema; No acute deformity     Asessement and Plan:.    Atrial fibrillation: Patient with history of frequent PACs, during TCAR was noted to have ventricular bigeminy intraoperatively, she went to atrial fibrillation on 7/10. Patient initially managed on oral metoprolol  however later was  re-admitted and found to be in afib with RVR.  Patient was started on amiodarone  to maintain sinus rhythm in setting of GI bleed as her Eliquis  needed to be held. Patient's preliminary results from her cardiac monitor showed an average heart rate of 70 bpm, ranging from 53 to 103 bpm, she occasional PVCs at 4.7% burden.  Patient notes that she only wore the monitor for 1 day and 17 hours as unfortunately when she applied it it was near her scar from her recent surgery and was painful, when she went to move the device it unfortunately fell off. After discussion with vascular team patient was instructed to stop Plavix , remained on aspirin , however this resulted in increased GI bleeding as noted below.  Today she does note an occasional skipped beat or few seconds of a fluttering sensation, denies any sustained palpitations.  Patient has since been resumed on Plavix  and Eliquis  by GI, denies any further frank blood in her stool, notes some dark stools which she feels is related to iron supplementation, denies any significant changes.  Discussed checking lab work today however patient deferred in favor of lab work to be completed at her PCPs office later this week, notes she has difficulty with signage and machines downstairs.  If her hemoglobin is stable we will plan to discontinue amiodarone  and repeat cardiac monitor in a few weeks to reassess for atrial fibrillation.  Continue metoprolol  to tartrate 50 mg twice daily, Eliquis  5 mg twice daily.  Reviewed ED precautions with patient.  CAD/hypertension: Coronary CTA in 10/2023 with suggestion of severe LAD disease but FFR was negative and nonobstructive disease elsewhere.  Patient notes occasional left upper abdomen chest discomfort that she feels is gas pain as she notes when she moves the pain resolves and she has an episode of belching.  Denies any significant chest pain on exertion.  Notes breathing is typically overall stable with COPD, notes that she currently  has a cold with increased congestion resulting in some slightly worsening shortness of breath.  Patient's blood pressure is better controlled as well as her cholesterol levels.  She is now on Plavix  and Eliquis  given recent TCAR and atrial fibrillation.  Reviewed ED precautions with patient.  Continue amlodipine  10 mg daily, Eliquis  5 mg twice daily, Lipitor  80 mg daily, Plavix  75 mg daily, lisinopril  30 mg daily, metoprolol  tartrate 50 mg twice daily.  GI bleeding/AVM: Patient with history of GI bleeding and AVMs.  Patient noted increased blood in her stool after 1 day of Eliquis , was admitted and underwent endoscopy and colonoscopy, found to have recurrent AVM, underwent clipping.  Per  GI she was started back on Eliquis  and Plavix  following discharge, she denies any further bleeding.  Patient has elected to have lab work completed at her PCPs office later this week.  Reviewed ED precautions.  Discussed possible Watchman device in the future, patient notes that she is not interested in any further procedures at this time, can discuss further on follow-up.  Moderate AS/mild AI: Noted on echo in 10/2023, repeat echo on 02/22/2024 indicated moderate aortic valve stenosis.  Will plan for repeat echocardiogram in 02/2025 or sooner if needed.  Hyperlipidemia: Last lipid profile on 02/14/2024 indicated LDL 67, triglyceride 70, HDL 56.  Continue Lipitor  80 mg daily.  Bilateral carotid disease: Right carotid occluded, left carotid artery s/p TCAR. She is followed by vascular surgery, she is currently on Plavix  75 mg daily.   Disposition: F/u with Dr. Lonni in 07/2024 as scheduled.   Signed, Carman Auxier D Emry Tobin, NP

## 2024-05-18 ENCOUNTER — Ambulatory Visit: Payer: Self-pay | Attending: Cardiology | Admitting: Cardiology

## 2024-05-18 ENCOUNTER — Encounter: Payer: Self-pay | Admitting: Cardiology

## 2024-05-18 VITALS — BP 130/68 | HR 64 | Ht 63.0 in | Wt 166.0 lb

## 2024-05-18 DIAGNOSIS — I350 Nonrheumatic aortic (valve) stenosis: Secondary | ICD-10-CM

## 2024-05-18 DIAGNOSIS — E782 Mixed hyperlipidemia: Secondary | ICD-10-CM

## 2024-05-18 DIAGNOSIS — I2510 Atherosclerotic heart disease of native coronary artery without angina pectoris: Secondary | ICD-10-CM

## 2024-05-18 DIAGNOSIS — I10 Essential (primary) hypertension: Secondary | ICD-10-CM

## 2024-05-18 DIAGNOSIS — I480 Paroxysmal atrial fibrillation: Secondary | ICD-10-CM

## 2024-05-18 DIAGNOSIS — I6523 Occlusion and stenosis of bilateral carotid arteries: Secondary | ICD-10-CM

## 2024-05-18 NOTE — Patient Instructions (Signed)
 Medication Instructions:  Your physician recommends that you continue on your current medications as directed. Please refer to the Current Medication list given to you today.  *If you need a refill on your cardiac medications before your next appointment, please call your pharmacy*  Follow-Up: At [facility], you and your health needs are our priority.  As part of our continuing mission to provide you with exceptional heart care, our providers are all part of one team.  This team includes your primary Cardiologist (physician) and Advanced Practice Providers or APPs (Physician Assistants and Nurse Practitioners) who all work together to provide you with the care you need, when you need it.  Your next appointment:   07/08/24 at 10:20am  Provider:   Shelda Bruckner, MD

## 2024-05-20 ENCOUNTER — Telehealth: Payer: Self-pay | Admitting: Cardiology

## 2024-05-20 NOTE — Telephone Encounter (Signed)
 Calling to see if there is any work restriction for he patient. Please advise

## 2024-05-20 NOTE — Telephone Encounter (Signed)
 Unsure if patient discussed or had papers at visit with Katlyn W NP on 05/18/2024  Will forward to Katlyn W NP for review

## 2024-05-21 ENCOUNTER — Telehealth: Payer: Self-pay

## 2024-05-21 ENCOUNTER — Encounter: Payer: Self-pay | Admitting: Primary Care

## 2024-05-21 ENCOUNTER — Ambulatory Visit: Payer: Self-pay | Admitting: Primary Care

## 2024-05-21 VITALS — BP 148/68 | HR 71 | Temp 96.9°F | Ht 63.0 in | Wt 168.0 lb

## 2024-05-21 DIAGNOSIS — B370 Candidal stomatitis: Secondary | ICD-10-CM | POA: Insufficient documentation

## 2024-05-21 DIAGNOSIS — K922 Gastrointestinal hemorrhage, unspecified: Secondary | ICD-10-CM

## 2024-05-21 DIAGNOSIS — J019 Acute sinusitis, unspecified: Secondary | ICD-10-CM

## 2024-05-21 DIAGNOSIS — K5520 Angiodysplasia of colon without hemorrhage: Secondary | ICD-10-CM

## 2024-05-21 LAB — CBC
HCT: 34.6 % — ABNORMAL LOW (ref 36.0–46.0)
Hemoglobin: 10.8 g/dL — ABNORMAL LOW (ref 12.0–15.0)
MCHC: 31.1 g/dL (ref 30.0–36.0)
MCV: 90.3 fl (ref 78.0–100.0)
Platelets: 458 K/uL — ABNORMAL HIGH (ref 150.0–400.0)
RBC: 3.83 Mil/uL — ABNORMAL LOW (ref 3.87–5.11)
RDW: 21.2 % — ABNORMAL HIGH (ref 11.5–15.5)
WBC: 11.5 K/uL — ABNORMAL HIGH (ref 4.0–10.5)

## 2024-05-21 MED ORDER — CLOTRIMAZOLE 10 MG MT TROC: 10.0000 mg | Freq: Three times a day (TID) | OROMUCOSAL | 0 refills | Status: AC

## 2024-05-21 MED ORDER — AMOXICILLIN-POT CLAVULANATE 875-125 MG PO TABS: 1.0000 | ORAL_TABLET | Freq: Two times a day (BID) | ORAL | 0 refills | Status: DC

## 2024-05-21 NOTE — Assessment & Plan Note (Signed)
 Exam today is somewhat representative.  Given the significant improvement previously with nystatin  we will switch to clotrimazole.  Start clotrimazole 10 mg troches 3 times daily x 7 days She will update.

## 2024-05-21 NOTE — Telephone Encounter (Signed)
 Kristen from [employer] called asking if Dr. Gretta had placed any work restrictions on the patient.  There was no evidence in the pt's chart that work restrictions were placed by Dr. Gretta.

## 2024-05-21 NOTE — Assessment & Plan Note (Signed)
 Treated. With recent GI bleed and hospitalization. Hospital notes, labs, imaging reviewed.  Continue oral iron. Continue Eliquis  and Plavix .   Repeat CBC pending.

## 2024-05-21 NOTE — Telephone Encounter (Signed)
 Copied from CRM [id number]. Topic: Medical Record Request - Other >> May 20, 2024  3:56 PM Rozanna MATSU wrote: Reason for CRM: Christine - [place]  [employer]  Will sending over disability form for provider to fill for pt.   Phone: [phone]  ext. [phone]

## 2024-05-21 NOTE — Patient Instructions (Addendum)
 Stop by the lab prior to leaving today. I will notify you of your results once received.   Start clotrimazole 10 mg tablets.  Dissolve 1 tablet into the mouth 3 times daily for 7 days.  Start Augmentin  antibiotics. Take 1 tablet by mouth twice daily for 7 days.  It was a pleasure to see you today!

## 2024-05-21 NOTE — Progress Notes (Signed)
 Subjective:    Patient ID: Denise [name], female    DOB: 1960-10-12, 63 y.o.   MRN: [id number]  Denise [name] is a very pleasant 63 y.o. female with a significant medical history including hypertension, AVM of colon, CAD, carotid artery disease, atrial flutter, COPD, SIADH, tobacco use, exertional dyspnea, visual disturbance who presents today for hospital follow-up.  She presented to [facility] ED on 05/02/2024 for several week history of intermittent black and red stools.  Workup was negative for acute GI bleed.  Labs revealed hyponatremia, acute anemia, hypocalcemia, low albumin .  She was admitted for further evaluation of GI bleed secondary to known AVMs..  During her hospital stay GI consulted and completed an EGD which showed single gastric AVM.  Colonoscopy revealed single AVM in the ascending colon,  polyps, hemorrhoids.  She received IV iron infusion. Her apixaban  and Plavix  were resumed postdischarge.  She was advised to start an oral iron supplement daily.  She was advised to follow-up with PCP for repeat CBC in 5 to 7 days..  Since her discharge home she has been evaluated by cardiology on 05/18/2025.  No changes were made to her regimen.   She believes she has a sinus infection. About three weeks ago she had a cold with cough, post nasal drip, sinus pressure. Since then her symptoms have lingered. Now her cough is worse and her sinus pressure is worse. She's noticed yellow mucous from her nose. She has a history of sinusitis and these symptoms feel similar.   She also believes she's reinfecting herself with thrush. Initially treated in July 2025 during her last admission. She was treated with Nystatin  4 times daily but had a hard time with compliance and never saw improvement. She read where she was reinfecting herself with her dentures. She feels like there's something swollen to the back of her throat. She is compliant to her oral iron daily. She denies bright red rectal bleeding.    BP Readings from Last 3 Encounters:  05/21/24 (!) 148/68  05/18/24 130/68  05/05/24 126/84      Review of Systems  Constitutional:  Positive for fatigue.  HENT:  Positive for sinus pressure and sore throat.        Oral thrush  Respiratory:  Positive for cough.   Gastrointestinal:  Negative for abdominal pain and blood in stool.         Past Medical History:  Diagnosis Date   Allergy    Anemia 12/24/2019   Anxiety    Aortic insufficiency    Aortic stenosis    Arthritis    Carotid artery disease    Centrilobular emphysema (HCC) 11/2023   Chickenpox    Cholecystitis with cholelithiasis 03/31/2012   Closed fracture of L4 transverse process s/p traumatic mechanical fall 10/26/2023   COPD (chronic obstructive pulmonary disease) (HCC)    Coronary artery disease    Depression    Diastolic dysfunction    Dyspnea    with exertion   GERD (gastroesophageal reflux disease)    Headache    Heart murmur    High anion gap metabolic acidosis 10/26/2023   History of blood transfusion 2021   HLD (hyperlipidemia)    Hypertension 03/31/2012   Hyponatremia in setting of SIADH    Left lower lobe pulmonary nodule 11/2023   Long-term use of aspirin  therapy    Lumbar compression fracture s/p traumatic mechanical fall; L4 superior endplate    Pneumonia    x 2   PVD (peripheral  vascular disease)    Shingles    SIADH (syndrome of inappropriate ADH production)    Traumatic mechanical fall 10/26/2023   a.) s/p traumatic fall 10/26/2023 reuslting in multiple RIGHT rib (5th - 7th) fractures (required dchest tube), l$ transverse process fracture, L4 superior endplate compression fracture, large volume post-traumatic myofacial/subcutaneous gas collection in chest    Social History   Socioeconomic History   Marital status: Widowed    Spouse name: Not on file   Number of children: 2   Years of education: Not on file   Highest education level: 12th grade  Occupational History    Occupation: Clinical biochemist Rep  Tobacco Use   Smoking status: Former    Current packs/day: 0.00    Average packs/day: 0.8 packs/day for 38.0 years (28.5 ttl pk-yrs)    Types: Cigarettes    Start date: 05/06/1985    Quit date: 05/07/2023    Years since quitting: 1.0   Smokeless tobacco: Never  Vaping Use   Vaping status: Never Used  Substance and Sexual Activity   Alcohol use: Yes    Alcohol/week: 1.0 standard drink of alcohol    Types: 1 Standard drinks or equivalent per week    Comment: occasionally   Drug use: No   Sexual activity: Not Currently    Birth control/protection: Post-menopausal  Other Topics Concern   Not on file  Social History Narrative   Widow.    2 children.   Works in Clinical biochemist. Currently on disability as of 04/2023.   Enjoys reading, cooking.    Social Drivers of Health   Financial Resource Strain: Patient Declined (01/28/2024)   Overall Financial Resource Strain (CARDIA)    Difficulty of Paying Living Expenses: Patient declined  Food Insecurity: No Food Insecurity (05/02/2024)   Hunger Vital Sign    Worried About Running Out of Food in the Last Year: Never true    Ran Out of Food in the Last Year: Never true  Recent Concern: Food Insecurity - Food Insecurity Present (02/19/2024)   Hunger Vital Sign    Worried About Running Out of Food in the Last Year: Sometimes true    Ran Out of Food in the Last Year: Sometimes true  Transportation Needs: Unmet Transportation Needs (05/02/2024)   PRAPARE - Administrator, Civil Service (Medical): Yes    Lack of Transportation (Non-Medical): No  Physical Activity: Insufficiently Active (01/28/2024)   Exercise Vital Sign    Days of Exercise per Week: 4 days    Minutes of Exercise per Session: 20 min  Stress: Stress Concern Present (01/28/2024)   [name] of Occupational Health - Occupational Stress Questionnaire    Feeling of Stress: Very much  Social Connections: Moderately Isolated  (05/02/2024)   Social Connection and Isolation Panel    Frequency of Communication with Friends and Family: More than three times a week    Frequency of Social Gatherings with Friends and Family: More than three times a week    Attends Religious Services: 1 to 4 times per year    Active Member of [employer] or Organizations: No    Attends Banker Meetings: Never    Marital Status: Widowed  Intimate Partner Violence: Not At Risk (05/02/2024)   Humiliation, Afraid, Rape, and Kick questionnaire    Fear of Current or Ex-Partner: No    Emotionally Abused: No    Physically Abused: No    Sexually Abused: No    Past Surgical History:  Procedure  Laterality Date   BIOPSY  12/24/2019   Procedure: BIOPSY;  Surgeon: Teressa Toribio SQUIBB, MD;  Location: [facility] ENDOSCOPY;  Service: Endoscopy;;   BRONCHIAL BIOPSY  12/19/2023   Procedure: BRONCHOSCOPY, WITH BIOPSY;  Surgeon: Isadora Hose, MD;  Location: [facility] ENDOSCOPY;  Service: Pulmonary;;   BRONCHIAL BRUSHINGS  12/19/2023   Procedure: BRONCHOSCOPY, WITH BRUSH BIOPSY;  Surgeon: Isadora Hose, MD;  Location: [facility] ENDOSCOPY;  Service: Pulmonary;;   BRONCHIAL NEEDLE ASPIRATION BIOPSY  12/19/2023   Procedure: BRONCHOSCOPY, WITH NEEDLE ASPIRATION BIOPSY;  Surgeon: Isadora Hose, MD;  Location: [facility] ENDOSCOPY;  Service: Pulmonary;;   BRONCHIAL WASHINGS  12/19/2023   Procedure: IRRIGATION, BRONCHUS;  Surgeon: Isadora Hose, MD;  Location: [facility] ENDOSCOPY;  Service: Pulmonary;;   BRONCHOSCOPY, WITH BIOPSY USING ELECTROMAGNETIC NAVIGATION Bilateral 12/19/2023   Procedure: ROBOTIC ASSISTED NAVIGATIONAL BRONCHOSCOPY;  Surgeon: Isadora Hose, MD;  Location: [facility] ENDOSCOPY;  Service: Pulmonary;  Laterality: Bilateral;   CATARACT EXTRACTION Bilateral 2021   CHOLECYSTECTOMY  03/30/2012   Procedure: LAPAROSCOPIC CHOLECYSTECTOMY WITH INTRAOPERATIVE CHOLANGIOGRAM;  Surgeon: Morene ONEIDA Olives, MD;  Location: [facility] ORS;  Service: General;  Laterality: N/A;   COLONOSCOPY N/A  05/05/2024   Procedure: COLONOSCOPY;  Surgeon: San Sandor GAILS, DO;  Location: [facility] ENDOSCOPY;  Service: Gastroenterology;  Laterality: N/A;   COLONOSCOPY WITH PROPOFOL  N/A 12/24/2019   Procedure: COLONOSCOPY WITH PROPOFOL ;  Surgeon: Teressa Toribio SQUIBB, MD;  Location: [facility] ENDOSCOPY;  Service: Endoscopy;  Laterality: N/A;   ESOPHAGOGASTRODUODENOSCOPY N/A 05/05/2024   Procedure: EGD (ESOPHAGOGASTRODUODENOSCOPY);  Surgeon: San Sandor GAILS, DO;  Location: [facility] ENDOSCOPY;  Service: Gastroenterology;  Laterality: N/A;   ESOPHAGOGASTRODUODENOSCOPY (EGD) WITH PROPOFOL  N/A 12/24/2019   Procedure: ESOPHAGOGASTRODUODENOSCOPY (EGD) WITH PROPOFOL ;  Surgeon: Teressa Toribio SQUIBB, MD;  Location: [facility] ENDOSCOPY;  Service: Endoscopy;  Laterality: N/A;   HOT HEMOSTASIS N/A 12/24/2019   Procedure: HOT HEMOSTASIS (ARGON PLASMA COAGULATION/BICAP);  Surgeon: Teressa Toribio SQUIBB, MD;  Location: [facility] ENDOSCOPY;  Service: Endoscopy;  Laterality: N/A;   HOT HEMOSTASIS N/A 05/05/2024   Procedure: EGD, WITH ARGON PLASMA COAGULATION;  Surgeon: San Sandor GAILS, DO;  Location: [facility] ENDOSCOPY;  Service: Gastroenterology;  Laterality: N/A;   POLYPECTOMY  12/24/2019   Procedure: POLYPECTOMY;  Surgeon: Teressa Toribio SQUIBB, MD;  Location: [facility] ENDOSCOPY;  Service: Endoscopy;;   POLYPECTOMY  05/05/2024   Procedure: POLYPECTOMY, INTESTINE;  Surgeon: San Sandor GAILS, DO;  Location: [facility] ENDOSCOPY;  Service: Gastroenterology;;   TRANSCAROTID ARTERY REVASCULARIZATION  Left 02/12/2024   Procedure: TRANSCAROTID ARTERY REVASCULARIZATION (TCAR);  Surgeon: Gretta Lonni PARAS, MD;  Location: [facility] OR;  Service: Vascular;  Laterality: Left;   TUBAL LIGATION  1997   VIDEO BRONCHOSCOPY WITH ENDOBRONCHIAL ULTRASOUND  12/19/2023   Procedure: BRONCHOSCOPY, WITH EBUS;  Surgeon: Isadora Hose, MD;  Location: [facility] ENDOSCOPY;  Service: Pulmonary;;   VIDEO BRONCHOSCOPY WITH RADIAL ENDOBRONCHIAL ULTRASOUND  12/19/2023   Procedure: VIDEO BRONCHOSCOPY WITH RADIAL ENDOBRONCHIAL  ULTRASOUND;  Surgeon: Isadora Hose, MD;  Location: [facility] ENDOSCOPY;  Service: Pulmonary;;    Family History  Problem Relation Age of Onset   Arthritis Mother    Cervical cancer Mother    Heart disease Father    Hypertension Father     No Known Allergies  Current Outpatient Medications on File Prior to Visit  Medication Sig Dispense Refill   acetaminophen  (TYLENOL ) 500 MG tablet Take 2 tablets (1,000 mg total) by mouth every 8 (eight) hours as needed.     albuterol  (VENTOLIN  HFA) 108 (90 Base) MCG/ACT inhaler INHALE 2 PUFFS INTO THE LUNGS EVERY 4-6 HOURS AS  NEEDED FOR TIGHTNESS/WHEEZING 8.5 each 0   amiodarone  (PACERONE ) 200 MG tablet Take 1 tablet (200 mg total) by mouth daily. 90 tablet 3   amLODipine  (NORVASC ) 10 MG tablet Take 1 tablet (10 mg total) by mouth daily. 90 tablet 3   apixaban  (ELIQUIS ) 5 MG TABS tablet Take 1 tablet (5 mg total) by mouth 2 (two) times daily. 60 tablet 6   Ascorbic Acid  (VITAMIN C ) 1000 MG tablet Take 2,000 mg by mouth daily.     atorvastatin  (LIPITOR ) 80 MG tablet TAKE 1 TABLET (80 MG TOTAL) BY MOUTH DAILY FOR CHOLESTEROL 90 tablet 0   busPIRone  (BUSPAR ) 10 MG tablet Take 1 tablet (10 mg total) by mouth 2 (two) times daily. For anxiety 180 tablet 0   clopidogrel  (PLAVIX ) 75 MG tablet Take 75 mg by mouth daily.     escitalopram  (LEXAPRO ) 20 MG tablet TAKE 1 TABLET BY MOUTH DAILY FOR ANXIETY 90 tablet 1   ferrous sulfate  325 (65 FE) MG tablet Take 1 tablet (325 mg total) by mouth 2 (two) times daily. 90 tablet 0   fluticasone -salmeterol (WIXELA INHUB) 250-50 MCG/ACT AEPB Inhale 1 puff into the lungs in the morning and at bedtime. 60 each 12   lisinopril  (ZESTRIL ) 30 MG tablet TAKE 1 TABLET (30 MG TOTAL) BY MOUTH DAILY FOR BLOOD PRESSURE. 90 tablet 2   metoprolol  tartrate (LOPRESSOR ) 50 MG tablet Take 1 tablet (50 mg total) by mouth 2 (two) times daily. 180 tablet 3   pantoprazole  (PROTONIX ) 40 MG tablet Take 1 tablet (40 mg total) by mouth daily. For  heartburn 90 tablet 0   pyridoxine  (B-6) 100 MG tablet Take 100 mg by mouth daily.     sodium chloride  1 g tablet TAKE 1 TABLET (1 G TOTAL) BY MOUTH 2 (TWO) TIMES DAILY WITH A MEAL. TO INCREASE SODIUM 180 tablet 0   Tiotropium Bromide Monohydrate  (SPIRIVA  RESPIMAT) 2.5 MCG/ACT AERS Inhale 2 puffs into the lungs daily. 60 each 12   vitamin B-12 (CYANOCOBALAMIN ) 1000 MCG tablet Take 1,000 mcg by mouth daily.     vitamin E  180 MG (400 UNITS) capsule Take 400 Units by mouth daily.     No current facility-administered medications on file prior to visit.    BP (!) 148/68   Pulse 71   Temp (!) 96.9 F (36.1 C) (Temporal)   Ht 5' 3 (1.6 m)   Wt 168 lb (76.2 kg)   SpO2 91%   BMI 29.76 kg/m  Objective:   Physical Exam HENT:     Mouth/Throat:      Comments: Several areas of whitish appearing substance to tongue. No evidence to posterior pharynx Cardiovascular:     Rate and Rhythm: Normal rate and regular rhythm.  Pulmonary:     Effort: Pulmonary effort is normal.     Breath sounds: Rhonchi present.  Musculoskeletal:     Cervical back: Neck supple.  Skin:    General: Skin is warm and dry.  Neurological:     Mental Status: She is alert and oriented to person, place, and time.  Psychiatric:        Mood and Affect: Mood normal.     Physical Exam        Assessment & Plan:  AVM (arteriovenous malformation) of colon Assessment & Plan: Treated. With recent GI bleed and hospitalization. Hospital notes, labs, imaging reviewed.  Continue oral iron. Continue Eliquis  and Plavix .   Repeat CBC pending.  Orders: -     CBC  Oral thrush Assessment & Plan: Exam today is somewhat representative.  Given the significant improvement previously with nystatin  we will switch to clotrimazole.  Start clotrimazole 10 mg troches 3 times daily x 7 days She will update.  Orders: -     Clotrimazole; Take 1 tablet (10 mg total) by mouth 3 (three) times daily for 7 days.  Dispense: 21  tablet; Refill: 0  Acute non-recurrent sinusitis, unspecified location Assessment & Plan: Given duration of symptoms, coupled with presentation, will treat.  Start Augmentin  antibiotics. Take 1 tablet by mouth twice daily for 7 days. Follow-up as needed  Orders: -     Amoxicillin -Pot Clavulanate; Take 1 tablet by mouth 2 (two) times daily.  Dispense: 14 tablet; Refill: 0  Gastrointestinal hemorrhage, unspecified gastrointestinal hemorrhage type Assessment & Plan: With hospitalization.  Hospital notes, labs, imaging reviewed.  Continue oral iron supplement. CBC pending  Orders: -     CBC    Assessment and Plan Assessment & Plan         Comer MARLA Gaskins, NP   History of Present Illness

## 2024-05-21 NOTE — Assessment & Plan Note (Signed)
 Given duration of symptoms, coupled with presentation, will treat.  Start Augmentin  antibiotics. Take 1 tablet by mouth twice daily for 7 days. Follow-up as needed

## 2024-05-21 NOTE — Assessment & Plan Note (Signed)
 With hospitalization.  Hospital notes, labs, imaging reviewed.  Continue oral iron supplement. CBC pending

## 2024-05-22 NOTE — Telephone Encounter (Signed)
 Form has been faxed by [facility].  Nothing further needed.

## 2024-05-26 ENCOUNTER — Telehealth: Payer: Self-pay | Admitting: Cardiology

## 2024-05-26 NOTE — Telephone Encounter (Signed)
 Spoke with pt. Pt was notified of lab results and recommendations to d/c Amiodarone  and wear a Zio non live monitor for 2 weeks. Pt declines to wear the monitor at this time. Per pt her insurance has changed and at this time in her life, she would like to decline the Zio monitor. Pt will d/c Amiodarone  as directed.

## 2024-05-26 NOTE — Telephone Encounter (Signed)
 Please let Denise [name] know that I have reviewed her recent CBC, her hemoglobin and hematocrit are stable with restarting of Eliquis .  Recommend that she discontinue amiodarone  at this time and continue Eliquis .  Recommend that she wear a 2-week non live ZIO monitor in 3-4 weeks to evaluate for atrial fibrillation with discontinuation of amiodarone .

## 2024-05-26 NOTE — Telephone Encounter (Signed)
 West, Denise [name] D, NP to Me    05/21/24  7:20 AM There was no discussion of any papers at her office visits.  Denise [name]

## 2024-06-08 NOTE — Progress Notes (Deleted)
 [facility] Initial Consultation   Referring Provider Gretta Comer POUR, NP [address] [place],  [place] [address]  Primary Care Provider Gretta Comer POUR, NP  Patient Profile: Denise [name] is a 63 y.o. female who is seen in consultation in the [facility] at the request of Dr. Gretta for evaluation and management of the problem(s) noted below.  Problem List: @DIAGSHORT @   History of Present Illness   Denise [name] is a 63 y.o. female with a history of ***.  Discussed the use of AI scribe software for clinical note transcription with the patient, who gave verbal consent to proceed.  History of Present Illness     Last colonoscopy: *** Last endoscopy: ***  Last Abd CT/CTE/MRE: ***  GI Review of Symptoms Significant for {GIROS:50592}. Otherwise negative.  General Review of Systems  Review of systems is significant for the pertinent positives and negatives as listed per the HPI.  Full ROS is otherwise negative.  Past Medical History   Past Medical History:  Diagnosis Date   Allergy    Anemia 12/24/2019   Anxiety    Aortic insufficiency    Aortic stenosis    Arthritis    Carotid artery disease    Centrilobular emphysema (HCC) 11/2023   Chickenpox    Cholecystitis with cholelithiasis 03/31/2012   Closed fracture of L4 transverse process s/p traumatic mechanical fall 10/26/2023   COPD (chronic obstructive pulmonary disease) (HCC)    Coronary artery disease    Depression    Diastolic dysfunction    Dyspnea    with exertion   GERD (gastroesophageal reflux disease)    Headache    Heart murmur    High anion gap metabolic acidosis 10/26/2023   History of blood transfusion 2021   HLD (hyperlipidemia)    Hypertension 03/31/2012   Hyponatremia in setting of SIADH    Left lower lobe pulmonary nodule 11/2023   Long-term use of aspirin  therapy    Lumbar compression fracture s/p traumatic mechanical fall; L4 superior endplate     Pneumonia    x 2   PVD (peripheral vascular disease)    Shingles    SIADH (syndrome of inappropriate ADH production)    Traumatic mechanical fall 10/26/2023   a.) s/p traumatic fall 10/26/2023 reuslting in multiple RIGHT rib (5th - 7th) fractures (required dchest tube), l$ transverse process fracture, L4 superior endplate compression fracture, large volume post-traumatic myofacial/subcutaneous gas collection in chest     Past Surgical History   Past Surgical History:  Procedure Laterality Date   BIOPSY  12/24/2019   Procedure: BIOPSY;  Surgeon: Teressa Toribio SQUIBB, MD;  Location: [facility] ENDOSCOPY;  Service: Endoscopy;;   BRONCHIAL BIOPSY  12/19/2023   Procedure: BRONCHOSCOPY, WITH BIOPSY;  Surgeon: Isadora Hose, MD;  Location: [facility] ENDOSCOPY;  Service: Pulmonary;;   BRONCHIAL BRUSHINGS  12/19/2023   Procedure: BRONCHOSCOPY, WITH BRUSH BIOPSY;  Surgeon: Isadora Hose, MD;  Location: [facility] ENDOSCOPY;  Service: Pulmonary;;   BRONCHIAL NEEDLE ASPIRATION BIOPSY  12/19/2023   Procedure: BRONCHOSCOPY, WITH NEEDLE ASPIRATION BIOPSY;  Surgeon: Isadora Hose, MD;  Location: [facility] ENDOSCOPY;  Service: Pulmonary;;   BRONCHIAL WASHINGS  12/19/2023   Procedure: IRRIGATION, BRONCHUS;  Surgeon: Isadora Hose, MD;  Location: [facility] ENDOSCOPY;  Service: Pulmonary;;   BRONCHOSCOPY, WITH BIOPSY USING ELECTROMAGNETIC NAVIGATION Bilateral 12/19/2023   Procedure: ROBOTIC ASSISTED NAVIGATIONAL BRONCHOSCOPY;  Surgeon: Isadora Hose, MD;  Location: [facility] ENDOSCOPY;  Service: Pulmonary;  Laterality: Bilateral;   CATARACT EXTRACTION Bilateral 2021   CHOLECYSTECTOMY  03/30/2012   Procedure: LAPAROSCOPIC CHOLECYSTECTOMY WITH INTRAOPERATIVE CHOLANGIOGRAM;  Surgeon: Morene ONEIDA Olives, MD;  Location: [facility] ORS;  Service: General;  Laterality: N/A;   COLONOSCOPY N/A 05/05/2024   Procedure: COLONOSCOPY;  Surgeon: San Sandor GAILS, DO;  Location: [facility] ENDOSCOPY;  Service: Gastroenterology;  Laterality: N/A;   COLONOSCOPY WITH PROPOFOL  N/A  12/24/2019   Procedure: COLONOSCOPY WITH PROPOFOL ;  Surgeon: Teressa Toribio SQUIBB, MD;  Location: [facility] ENDOSCOPY;  Service: Endoscopy;  Laterality: N/A;   ESOPHAGOGASTRODUODENOSCOPY N/A 05/05/2024   Procedure: EGD (ESOPHAGOGASTRODUODENOSCOPY);  Surgeon: San Sandor GAILS, DO;  Location: [facility] ENDOSCOPY;  Service: Gastroenterology;  Laterality: N/A;   ESOPHAGOGASTRODUODENOSCOPY (EGD) WITH PROPOFOL  N/A 12/24/2019   Procedure: ESOPHAGOGASTRODUODENOSCOPY (EGD) WITH PROPOFOL ;  Surgeon: Teressa Toribio SQUIBB, MD;  Location: [facility] ENDOSCOPY;  Service: Endoscopy;  Laterality: N/A;   HOT HEMOSTASIS N/A 12/24/2019   Procedure: HOT HEMOSTASIS (ARGON PLASMA COAGULATION/BICAP);  Surgeon: Teressa Toribio SQUIBB, MD;  Location: [facility] ENDOSCOPY;  Service: Endoscopy;  Laterality: N/A;   HOT HEMOSTASIS N/A 05/05/2024   Procedure: EGD, WITH ARGON PLASMA COAGULATION;  Surgeon: San Sandor GAILS, DO;  Location: [facility] ENDOSCOPY;  Service: Gastroenterology;  Laterality: N/A;   POLYPECTOMY  12/24/2019   Procedure: POLYPECTOMY;  Surgeon: Teressa Toribio SQUIBB, MD;  Location[facility] ENDOSCOPY;  Service: Endoscopy;;   POLYPECTOMY  05/05/2024   Procedure: POLYPECTOMY, INTESTINE;  Surgeon: San Sandor GAILS, DO;  Location: [facility] ENDOSCOPY;  Service: Gastroenterology;;   TRANSCAROTID ARTERY REVASCULARIZATION  Left 02/12/2024   Procedure: TRANSCAROTID ARTERY REVASCULARIZATION (TCAR);  Surgeon: Gretta Lonni PARAS, MD;  Location: [facility] OR;  Service: Vascular;  Laterality: Left;   TUBAL LIGATION  1997   VIDEO BRONCHOSCOPY WITH ENDOBRONCHIAL ULTRASOUND  12/19/2023   Procedure: BRONCHOSCOPY, WITH EBUS;  Surgeon: Isadora Hose, MD;  Location: [facility] ENDOSCOPY;  Service: Pulmonary;;   VIDEO BRONCHOSCOPY WITH RADIAL ENDOBRONCHIAL ULTRASOUND  12/19/2023   Procedure: VIDEO BRONCHOSCOPY WITH RADIAL ENDOBRONCHIAL ULTRASOUND;  Surgeon: Isadora Hose, MD;  Location: [facility] ENDOSCOPY;  Service: Pulmonary;;     Allergies and Medications   No Known Allergies  @MEDSTODAY @  Family  History   Family History  Problem Relation Age of Onset   Arthritis Mother    Cervical cancer Mother    Heart disease Father    Hypertension Father      Social History   Social History   Tobacco Use   Smoking status: Former    Current packs/day: 0.00    Average packs/day: 0.8 packs/day for 38.0 years (28.5 ttl pk-yrs)    Types: Cigarettes    Start date: 05/06/1985    Quit date: 05/07/2023    Years since quitting: 1.0   Smokeless tobacco: Never  Vaping Use   Vaping status: Never Used  Substance Use Topics   Alcohol use: Yes    Alcohol/week: 1.0 standard drink of alcohol    Types: 1 Standard drinks or equivalent per week    Comment: occasionally   Drug use: No   Myrtie reports that she quit smoking about 13 months ago. Her smoking use included cigarettes. She started smoking about 39 years ago. She has a 28.5 pack-year smoking history. She has never used smokeless tobacco. She reports current alcohol use of about 1.0 standard drink of alcohol per week. She reports that she does not use drugs.  Vital Signs and Physical Examination   There were no vitals filed for this visit. There is no height or weight on file to calculate BMI.    General: Well developed, well nourished, no acute distress Head:  Normocephalic and atraumatic Eyes: Sclerae anicteric, EOMI Ears: Normal auditory acuity Mouth: No deformities or lesions noted Lungs: Clear throughout to auscultation Heart: Regular rate and rhythm; No murmurs, rubs or bruits Abdomen: Soft, non tender and non distended. No masses, hepatosplenomegaly or hernias noted. Normal Bowel sounds Rectal: Musculoskeletal: Symmetrical with no gross deformities  Pulses:  Normal pulses noted Extremities: No edema or deformities noted Neurological: Alert oriented x 4, grossly nonfocal Psychological:  Alert and cooperative. Normal mood and affect  Review of Data  The following data was reviewed at the time of this encounter:  Laboratory  Studies      Latest Ref Rng & Units 05/21/2024   12:28 PM 05/05/2024   11:14 AM 05/05/2024   12:47 AM  CBC  WBC 4.0 - 10.5 K/uL 11.5     Hemoglobin 12.0 - 15.0 g/dL 89.1  89.6  9.8   Hematocrit 36.0 - 46.0 % 34.6  32.6  30.6   Platelets 150.0 - 400.0 K/uL 458.0       Lab Results  Component Value Date   LIPASE 30 03/29/2012      Latest Ref Rng & Units 05/04/2024    7:45 AM 05/03/2024   12:08 AM 05/02/2024   11:39 AM  CMP  Glucose 70 - 99 mg/dL 95  897  89   BUN 8 - 23 mg/dL <5  <5  7   Creatinine 0.44 - 1.00 mg/dL 9.40  9.40  9.19   Sodium 135 - 145 mmol/L 134  133  129   Potassium 3.5 - 5.1 mmol/L 3.6  3.7  4.0   Chloride 98 - 111 mmol/L 99  100  96   CO2 22 - 32 mmol/L 25  24    Calcium  8.9 - 10.3 mg/dL 8.8  8.4    Total Protein 6.5 - 8.1 g/dL 6.6     Total Bilirubin 0.0 - 1.2 mg/dL 0.7     Alkaline Phos 38 - 126 U/L 81     AST 15 - 41 U/L 28     ALT 0 - 44 U/L 28        Imaging Studies    GI Procedures and Studies      Clinical Impression  It is my clinical impression that Ms. Nazaryan is a 63 y.o. female with;  ***  Plan  *** *** *** *** ***  Planned Follow Up No follow-ups on file.  The patient or caregiver verbalized understanding of the material covered, with no barriers to understanding. All questions were answered. Patient or caregiver is agreeable with the plan outlined above.    It was a pleasure to see Maudine.  If you have any questions or concerns regarding this evaluation, do not hesitate to contact me.  Inocente Hausen, MD [facility]

## 2024-06-10 ENCOUNTER — Ambulatory Visit: Admitting: Pediatrics

## 2024-06-12 ENCOUNTER — Other Ambulatory Visit: Payer: Self-pay | Admitting: Primary Care

## 2024-06-12 DIAGNOSIS — F32A Depression, unspecified: Secondary | ICD-10-CM

## 2024-06-18 ENCOUNTER — Telehealth: Payer: Self-pay | Admitting: Cardiology

## 2024-06-18 NOTE — Telephone Encounter (Signed)
 Long Term Disability office calling in regards to whether pt has any work restrictions. Off stated that they will be faxing over documents for provider to review and sign. Please advise

## 2024-06-18 NOTE — Telephone Encounter (Signed)
 Forms in Media, will review with Dr. Lonni. She has never seen this patient who is former Dr. Alveta and has been seeing APPs all year.  Most recently Katlyn West, NP on 05/18/24.

## 2024-06-19 ENCOUNTER — Other Ambulatory Visit (HOSPITAL_COMMUNITY): Payer: Self-pay

## 2024-06-19 ENCOUNTER — Telehealth: Payer: Self-pay | Admitting: Pharmacy Technician

## 2024-06-19 NOTE — Telephone Encounter (Signed)
See phone note 11/13

## 2024-06-19 NOTE — Telephone Encounter (Addendum)
 PAP: Patient assistance application for Eliquis  through [employer] ([employer] has been mailed to pt's home address on file. Provider portion of application will be faxed to provider's office.   Provider portion uploaded to media

## 2024-06-23 ENCOUNTER — Telehealth: Payer: Self-pay | Admitting: Cardiology

## 2024-06-23 NOTE — Telephone Encounter (Signed)
 We received a disability form from [employer].  I have tried several times to contact her and have left messages.  Since she has not returned my calls, I mailed the form to her home address.  I attached a note that said that if she wants us  to complete the form, she will have to bring it in and pay the $29 form fee.

## 2024-07-06 NOTE — Progress Notes (Signed)
 Cardiology Office Note:  .   Date:  07/08/2024  ID:  Nena Rosabel Sharps, DOB 1960/08/26, MRN [id number] PCP: Gretta Comer POUR, NP  [facility] Providers Cardiologist:  Shelda Bruckner, MD {  History of Present Illness: .   Zofia Peckinpaugh is a 63 y.o. female with PMH carotid artery disease s/p TCAR 02/2024, paroxysmal atrial fibrillation, CAD, moderate aortic stenosis. She was previously followed by Dr. Alveta, and I met her in the hospital 02/12/2024.  Pertinent CV history: Atrial fibrillation initially diagnosed 02/2024 during hospitalization, post TCAR.   Today: Reviewed hospitalization 02/2024 and events since. Of note, presented 02/21/24 with concern for seizure. MRI and EEG unremarkable.  Followed by Katlyn West post discharge. Ordered for a monitor to assess afib burden, but only wore for one day due to discomfort.  She now has insurance again, discussed options. She felt much better off of amiodarone , breathing was significantly worse on this. Feels occasional skipped/early beat, no prolonged symptoms.   Stool is dark because of the iron , but she has not seen any blood or tarry looking stool. Had one mild nosebleed but otherwise no issues.   Notes voice has remain hoarse since her carotid stent. Has had some difficulty swallowing since then as well. Hasn't seen vascular since her procedure. Hoarseness comes and goes, worse with humidity/weather changes. Planning to see ENT.   Breathing has been followed by Dr. Isadora, had robotic bronchoscopy for nodule, has been working on COPD regimen.  Seen by vascular 03/17/2024, was on clopidogrel  only at the time, noted that they were ok with changing to aspirin  only.   ROS: Denies chest pain, shortness of breath at rest or with normal exertion. No PND, orthopnea, LE edema or unexpected weight gain. No syncope or palpitations. ROS otherwise negative except as noted.   Studies Reviewed: SABRA    EKG:       Physical Exam:   VS:   BP 122/60 (BP Location: Left Arm, Patient Position: Sitting, Cuff Size: Normal)   Pulse 71   Ht 5' 3 (1.6 m)   Wt 166 lb (75.3 kg)   SpO2 93%   BMI 29.41 kg/m    Wt Readings from Last 3 Encounters:  07/08/24 166 lb (75.3 kg)  05/21/24 168 lb (76.2 kg)  05/18/24 166 lb (75.3 kg)    GEN: Well nourished, well developed in no acute distress HEENT: Normal, moist mucous membranes NECK: No JVD CARDIAC: regular rhythm, normal S1 and S2, no rubs or gallops. 2/6 systolic murmur. VASCULAR: Radial and DP pulses 2+ bilaterally. No carotid bruits RESPIRATORY:  Clear to auscultation without rales, wheezing or rhonchi  ABDOMEN: Soft, non-tender, non-distended MUSCULOSKELETAL:  Ambulates independently SKIN: Warm and dry, no edema NEUROLOGIC:  Alert and oriented x 3. No focal neuro deficits noted. PSYCHIATRIC:  Normal affect    ASSESSMENT AND PLAN: .    Postoperative/paroxysmal atrial fibrillation diagnosed 02/2024 GI bleeding PVCs, PACs -CHA2DS2/VAS Stroke Risk Points=3 -initial plan was for brief time of aspirin , clopidogrel , and DOAC, but then had GI bleed, so DOAC held -started on amiodarone , short term given COPD history -re-presented 05/02/24 with significant GI bleed and Hgb drop from 13 to 8. Upper/lower endoscopy showed AVM in ascending colon and mild gastritis. Restarted on apixaban  and clopidogrel . -we discussed atrial fibrillation, risk of stroke, and risk of bleeding at length today. Discussed afib--even if a 2 week monitor showed no afib, there is still a risk of future afib, especially with significant lung disease. So, we  would either need long term monitoring (like a loop) or to continue stroke protection. If we are going to continue stroke protection, we discussed anticoagulation vs. Watchman. -after shared decision making, she is not interested in a procedure. She has not seen any evidence of bleeding. She is due for repeat labs. We will continue anticoagulation for now and follow  closely   CAD based on coronary CT Bilateral carotid disease, R occluded, L s/p TCAR 02/2024 PAD without symptoms -was on aspirin , clopidogrel  per vascular. Their last note said it was ok to change clopidogrel  to aspirin . Discussed with patient, she would like to remain on clopidogrel  for now as long as she doesn't have bleeding -LDL 67, continue atorvastatin  -lpa 194.5, elevated   Moderate aortic stenosis -follow symptoms and recheck echo 02/2025  CV risk counseling and prevention -recommend heart healthy/Mediterranean diet, with whole grains, fruits, vegetable, fish, lean meats, nuts, and olive oil. Limit salt. -recommend moderate walking, 3-5 times/week for 30-50 minutes each session. Aim for at least 150 minutes/week. Goal should be pace of 3 miles/hours, or walking 1.5 miles in 30 minutes -recommend avoidance of tobacco products. Avoid excess alcohol.  Dispo: 3 mos  Signed, Shelda Bruckner, MD   Shelda Bruckner, MD, PhD, [facility] [place]  [facility]    Heart & Vascular at [facility] at [facility] [address], [address] [place], [place] [address] [phone]

## 2024-07-06 NOTE — Telephone Encounter (Signed)
 Provider portion uploaded to media

## 2024-07-06 NOTE — Telephone Encounter (Signed)
 Duplicate application mailed

## 2024-07-07 NOTE — Telephone Encounter (Signed)
 Patient got new ins and said she does not need

## 2024-07-08 ENCOUNTER — Ambulatory Visit (HOSPITAL_BASED_OUTPATIENT_CLINIC_OR_DEPARTMENT_OTHER): Admitting: Cardiology

## 2024-07-08 ENCOUNTER — Encounter (HOSPITAL_BASED_OUTPATIENT_CLINIC_OR_DEPARTMENT_OTHER): Payer: Self-pay | Admitting: Cardiology

## 2024-07-08 VITALS — BP 122/60 | HR 71 | Ht 63.0 in | Wt 166.0 lb

## 2024-07-08 DIAGNOSIS — Z7901 Long term (current) use of anticoagulants: Secondary | ICD-10-CM

## 2024-07-08 DIAGNOSIS — Z8719 Personal history of other diseases of the digestive system: Secondary | ICD-10-CM | POA: Diagnosis not present

## 2024-07-08 DIAGNOSIS — I6523 Occlusion and stenosis of bilateral carotid arteries: Secondary | ICD-10-CM | POA: Diagnosis not present

## 2024-07-08 DIAGNOSIS — D6869 Other thrombophilia: Secondary | ICD-10-CM | POA: Diagnosis not present

## 2024-07-08 DIAGNOSIS — I350 Nonrheumatic aortic (valve) stenosis: Secondary | ICD-10-CM | POA: Diagnosis not present

## 2024-07-08 DIAGNOSIS — I2510 Atherosclerotic heart disease of native coronary artery without angina pectoris: Secondary | ICD-10-CM | POA: Diagnosis not present

## 2024-07-08 DIAGNOSIS — I480 Paroxysmal atrial fibrillation: Secondary | ICD-10-CM | POA: Diagnosis not present

## 2024-07-08 NOTE — Patient Instructions (Signed)
 Medication Instructions:  No changes *If you need a refill on your cardiac medications before your next appointment, please call your pharmacy*  Lab Work: none If you have labs (blood work) drawn today and your tests are completely normal, you will receive your results only by: MyChart Message (if you have MyChart) OR A paper copy in the mail If you have any lab test that is abnormal or we need to change your treatment, we will call you to review the results.  Testing/Procedures: none  Follow-Up: At [facility], you and your health needs are our priority.  As part of our continuing mission to provide you with exceptional heart care, our providers are all part of one team.  This team includes your primary Cardiologist (physician) and Advanced Practice Providers or APPs (Physician Assistants and Nurse Practitioners) who all work together to provide you with the care you need, when you need it.  Your next appointment:   3 month(s)  Provider:   Shelda Bruckner, MD, Rosaline Bane, NP, or Reche Finder, NP

## 2024-07-08 NOTE — Telephone Encounter (Signed)
 Patient was here today for a visit with Dr. Lonni.  She discussed that she will likely not need the [employer] form completed any longer as she has been approved for SS disability.  This is due to carotid stenosis affecting her vision.  She needs nothing further from us  at this time and will let us  know if that changes.

## 2024-07-08 NOTE — Telephone Encounter (Signed)
 Kirsten calling in for an update on getting this paperwork filled out. She states the invoice for $29 fee should be sent to them if that needs to be paid. She also needs a call back about the restrictions. Please advise.

## 2024-07-09 ENCOUNTER — Other Ambulatory Visit: Payer: Self-pay | Admitting: Primary Care

## 2024-07-09 DIAGNOSIS — E785 Hyperlipidemia, unspecified: Secondary | ICD-10-CM

## 2024-07-12 ENCOUNTER — Encounter (HOSPITAL_BASED_OUTPATIENT_CLINIC_OR_DEPARTMENT_OTHER): Payer: Self-pay | Admitting: Cardiology

## 2024-07-12 DIAGNOSIS — I480 Paroxysmal atrial fibrillation: Secondary | ICD-10-CM | POA: Insufficient documentation

## 2024-07-13 ENCOUNTER — Other Ambulatory Visit: Payer: Self-pay | Admitting: Primary Care

## 2024-07-13 DIAGNOSIS — F419 Anxiety disorder, unspecified: Secondary | ICD-10-CM

## 2024-07-13 DIAGNOSIS — K922 Gastrointestinal hemorrhage, unspecified: Secondary | ICD-10-CM

## 2024-07-14 ENCOUNTER — Ambulatory Visit: Payer: Self-pay

## 2024-07-14 NOTE — Telephone Encounter (Signed)
 Can we get her in with somebody this week?  Also provide ED precautions.

## 2024-07-14 NOTE — Telephone Encounter (Signed)
 FYI Only or Action Required?: FYI only for provider: ED advised. ED refused. Requesting appointment.  Patient was last seen in primary care on 05/21/2024 by Gretta Comer POUR, NP.  Called Nurse Triage reporting Chest Pain.  Symptoms began several days ago.  Interventions attempted: OTC medications: mucinex .  Symptoms are: unchanged.  Triage Disposition: Go to ED Now (or PCP Triage)  Patient/caregiver understands and will follow disposition?: No, wishes to speak with PCP  Copied from CRM #[id number]. Topic: Clinical - Red Word Triage >> Jul 14, 2024  3:04 PM Alfonso HERO wrote: Patient calling because she has pain in her chest and its making it hard for her to breath. Reason for Disposition  Taking a deep breath makes pain worse  Answer Assessment - Initial Assessment Questions Taking Mucinex . 1. LOCATION: Where does it hurt?      Left side chest pain that started Sunday. Worsens with a deep breathe. has had side luerisy many times before.  SOB with activity. States has some SOB with weather changes.  3. SEVERITY: How bad is the pain?   (Scale 0-10; or none, mild, moderate or severe)     1 that worsens with a deep breath. 4. RECURRENT SYMPTOM: Have you ever had sinus problems before? If Yes, ask: When was the last time? and What happened that time?      Cloudy white sputum 7. FEVER: Do you have a fever? If Yes, ask: What is it, how was it measured, and when did it start?      Denies 8. OTHER SYMPTOMS: Do you have any other symptoms? (e.g., sore throat, cough, earache, difficulty breathing)     denies  Answer Assessment - Initial Assessment Questions Chest discomfort since Sunday. Worsens with a deep breath. Productive cough, white sputum. Patient does not want to go to ED. 1. LOCATION: Where does it hurt?       Left side of chest 2. RADIATION: Does the pain go anywhere else? (e.g., into neck, jaw, arms, back)     denies 3. ONSET: When did the chest pain begin?  (Minutes, hours or days)      Sunday 4. PATTERN: Does the pain come and go, or has it been constant since it started?  Does it get worse with exertion?      Worse with coughing 6. SEVERITY: How bad is the pain?  (e.g., Scale 1-10; mild, moderate, or severe)     1, worsens with a deep breath 7. CARDIAC RISK FACTORS: Do you have any history of heart problems or risk factors for heart disease? (e.g., angina, prior heart attack; diabetes, high blood pressure, high cholesterol, smoker, or strong family history of heart disease)     HTN, CAD, aortic stenosis 8. PULMONARY RISK FACTORS: Do you have any history of lung disease?  (e.g., blood clots in lung, asthma, emphysema, birth control pills)     Pluerisy, COPD 9. CAUSE: What do you think is causing the chest pain?     Unsure, history of pluerisy 10. OTHER SYMPTOMS: Do you have any other symptoms? (e.g., dizziness, nausea, vomiting, sweating, fever, difficulty breathing, cough)       Productive cough  Protocols used: Sinus Pain or Congestion-A-AH, Chest Pain-A-AH

## 2024-07-15 ENCOUNTER — Emergency Department (HOSPITAL_COMMUNITY)
Admission: EM | Admit: 2024-07-15 | Discharge: 2024-07-16 | Disposition: A | Discharge status: Home / Self Care | Attending: Emergency Medicine | Admitting: Emergency Medicine

## 2024-07-15 ENCOUNTER — Emergency Department (HOSPITAL_COMMUNITY)

## 2024-07-15 ENCOUNTER — Other Ambulatory Visit: Payer: Self-pay

## 2024-07-15 DIAGNOSIS — J181 Lobar pneumonia, unspecified organism: Secondary | ICD-10-CM | POA: Diagnosis not present

## 2024-07-15 DIAGNOSIS — Z7901 Long term (current) use of anticoagulants: Secondary | ICD-10-CM | POA: Insufficient documentation

## 2024-07-15 DIAGNOSIS — J40 Bronchitis, not specified as acute or chronic: Secondary | ICD-10-CM | POA: Diagnosis not present

## 2024-07-15 DIAGNOSIS — Z955 Presence of coronary angioplasty implant and graft: Secondary | ICD-10-CM | POA: Insufficient documentation

## 2024-07-15 DIAGNOSIS — E871 Hypo-osmolality and hyponatremia: Secondary | ICD-10-CM | POA: Insufficient documentation

## 2024-07-15 DIAGNOSIS — Z87891 Personal history of nicotine dependence: Secondary | ICD-10-CM | POA: Insufficient documentation

## 2024-07-15 DIAGNOSIS — Z7902 Long term (current) use of antithrombotics/antiplatelets: Secondary | ICD-10-CM | POA: Insufficient documentation

## 2024-07-15 DIAGNOSIS — M545 Low back pain, unspecified: Secondary | ICD-10-CM | POA: Insufficient documentation

## 2024-07-15 DIAGNOSIS — I10 Essential (primary) hypertension: Secondary | ICD-10-CM | POA: Insufficient documentation

## 2024-07-15 DIAGNOSIS — R911 Solitary pulmonary nodule: Secondary | ICD-10-CM | POA: Diagnosis not present

## 2024-07-15 DIAGNOSIS — I4891 Unspecified atrial fibrillation: Secondary | ICD-10-CM | POA: Insufficient documentation

## 2024-07-15 DIAGNOSIS — J168 Pneumonia due to other specified infectious organisms: Secondary | ICD-10-CM | POA: Insufficient documentation

## 2024-07-15 DIAGNOSIS — R0789 Other chest pain: Secondary | ICD-10-CM | POA: Diagnosis not present

## 2024-07-15 DIAGNOSIS — R0602 Shortness of breath: Secondary | ICD-10-CM | POA: Diagnosis present

## 2024-07-15 DIAGNOSIS — Z7951 Long term (current) use of inhaled steroids: Secondary | ICD-10-CM | POA: Diagnosis not present

## 2024-07-15 DIAGNOSIS — J449 Chronic obstructive pulmonary disease, unspecified: Secondary | ICD-10-CM | POA: Diagnosis not present

## 2024-07-15 DIAGNOSIS — J189 Pneumonia, unspecified organism: Secondary | ICD-10-CM

## 2024-07-15 DIAGNOSIS — J90 Pleural effusion, not elsewhere classified: Secondary | ICD-10-CM | POA: Diagnosis not present

## 2024-07-15 DIAGNOSIS — Z79899 Other long term (current) drug therapy: Secondary | ICD-10-CM | POA: Diagnosis not present

## 2024-07-15 DIAGNOSIS — R079 Chest pain, unspecified: Secondary | ICD-10-CM | POA: Diagnosis not present

## 2024-07-15 LAB — CBC
HCT: 39.7 % (ref 36.0–46.0)
Hemoglobin: 11.6 g/dL — ABNORMAL LOW (ref 12.0–15.0)
MCH: 27.6 pg (ref 26.0–34.0)
MCHC: 29.2 g/dL — ABNORMAL LOW (ref 30.0–36.0)
MCV: 94.3 fL (ref 80.0–100.0)
Platelets: 411 K/uL — ABNORMAL HIGH (ref 150–400)
RBC: 4.21 MIL/uL (ref 3.87–5.11)
RDW: 18.4 % — ABNORMAL HIGH (ref 11.5–15.5)
WBC: 10.1 K/uL (ref 4.0–10.5)
nRBC: 0 % (ref 0.0–0.2)

## 2024-07-15 LAB — BASIC METABOLIC PANEL WITH GFR
Anion gap: 8 (ref 5–15)
BUN: 6 mg/dL — ABNORMAL LOW (ref 8–23)
CO2: 25 mmol/L (ref 22–32)
Calcium: 8.6 mg/dL — ABNORMAL LOW (ref 8.9–10.3)
Chloride: 97 mmol/L — ABNORMAL LOW (ref 98–111)
Creatinine, Ser: 0.54 mg/dL (ref 0.44–1.00)
GFR, Estimated: >60 mL/min (ref >60)
Glucose, Bld: 95 mg/dL (ref 70–99)
Potassium: 4.2 mmol/L (ref 3.5–5.1)
Sodium: 130 mmol/L — ABNORMAL LOW (ref 135–145)

## 2024-07-15 LAB — RESP PANEL BY RT-PCR (RSV, FLU A&B, COVID)  RVPGX2
Influenza A by PCR: NEGATIVE
Influenza B by PCR: NEGATIVE
Resp Syncytial Virus by PCR: NEGATIVE
SARS Coronavirus 2 by RT PCR: NEGATIVE

## 2024-07-15 LAB — TROPONIN I (HIGH SENSITIVITY): Troponin I (High Sensitivity): 7 ng/L (ref <18)

## 2024-07-15 NOTE — ED Triage Notes (Signed)
 Patient here with complaints of chest pain and shob. Patient states pain in her back started Sunday and she has been coughing a lot and now she is shob on exertion. She reports that her chest feels heavy and when she takes that it hurts to take a deep breath.  PCP told her to come to the ED.  PMH: COPD and possibly emphysema, takes eliquis  and plavix  for a carotid artery stent

## 2024-07-15 NOTE — Telephone Encounter (Signed)
 Lvm asking pt to call back. Pls see [name]'s message below and schedule pt with an available provider this week.

## 2024-07-16 ENCOUNTER — Emergency Department (HOSPITAL_COMMUNITY)

## 2024-07-16 LAB — TROPONIN I (HIGH SENSITIVITY): Troponin I (High Sensitivity): 6 ng/L (ref <18)

## 2024-07-16 MED ORDER — IPRATROPIUM-ALBUTEROL 0.5-2.5 (3) MG/3ML IN SOLN
3.0000 mL | Freq: Once | RESPIRATORY_TRACT | Status: AC
  Administered 2024-07-16: 3 mL via RESPIRATORY_TRACT
  Filled 2024-07-16: qty 3

## 2024-07-16 MED ORDER — AMOXICILLIN-POT CLAVULANATE 875-125 MG PO TABS: 1.0000 | ORAL_TABLET | Freq: Two times a day (BID) | ORAL | 0 refills | Status: DC

## 2024-07-16 MED ORDER — IOHEXOL 350 MG/ML SOLN
75.0000 mL | Freq: Once | INTRAVENOUS | Status: AC | PRN
  Administered 2024-07-16: 75 mL via INTRAVENOUS

## 2024-07-16 MED ORDER — OXYCODONE-ACETAMINOPHEN 5-325 MG PO TABS
1.0000 | ORAL_TABLET | ORAL | Status: DC | PRN
  Administered 2024-07-16: 1 via ORAL
  Filled 2024-07-16: qty 1

## 2024-07-16 MED ORDER — AZITHROMYCIN 250 MG PO TABS: 250.0000 mg | ORAL_TABLET | Freq: Every day | ORAL | 0 refills | Status: DC

## 2024-07-16 NOTE — ED Notes (Signed)
ED PA at [facility] 

## 2024-07-16 NOTE — Discharge Instructions (Signed)
 Please take antibiotic as prescribed.  Use albuterol  inhaler as needed for shortness of breath or chest tightness.  I would recommend following up with your primary care doctor in 3 to 5 days for recheck of symptoms. Return to emergency room with worsening symptoms.

## 2024-07-16 NOTE — Telephone Encounter (Signed)
 Noted.  It looks like she went to the ED today.  Will wait ED notes.

## 2024-07-16 NOTE — ED Notes (Signed)
 Patient transported to CT

## 2024-07-16 NOTE — Telephone Encounter (Signed)
 Lvm asking pt to call back. Pls see [name]'s message below and schedule pt with an available provider this week.  Fyi, looks like pt went to [facility] ED yesterday.

## 2024-07-16 NOTE — ED Provider Notes (Signed)
 [place] EMERGENCY DEPARTMENT AT [facility] Provider Note   CSN: [id number] Arrival date & time: 07/15/24  2031     Patient presents with: Back Pain and Shortness of Breath   Denise [name] is a 63 y.o. female patient with past medical history of COPD, hypertension, hyperlipidemia, carotid artery stent on Plavix , A-fib on Eliquis , history of pulmonary nodule, former smoker presents to emergency room with complaint of mid back pain, shortness of breath with exertion, productive cough. This started shortly after having viral URI. She reports this is present at baseline but worse over the past 4 days and the mid back pain in new. Locates back pain to bilateral lower posterior/lateral ribs.  This is specifically worse when taking a deep breath in, coughing or moving side-to-side.   She denies any injury or fall.  No fever.  She denies any history of DVT/PE, recent immobilization, recent surgery, hormone replacement.  She is on Eliquis  for afib and has not missed any doses.    Back Pain Shortness of Breath      Prior to Admission medications  Medication Sig Start Date End Date Taking? Authorizing Provider  acetaminophen  (TYLENOL ) 500 MG tablet Take 2 tablets (1,000 mg total) by mouth every 8 (eight) hours as needed. 10/31/23   Maczis, Michael M, PA-C  albuterol  (VENTOLIN  HFA) 108 (90 Base) MCG/ACT inhaler INHALE 2 PUFFS INTO THE LUNGS EVERY 4-6 HOURS AS NEEDED FOR TIGHTNESS/WHEEZING 01/31/24   Gretta Comer POUR, NP  amLODipine  (NORVASC ) 10 MG tablet Take 1 tablet (10 mg total) by mouth daily. 04/09/24 07/08/24  West, Katlyn D, NP  apixaban  (ELIQUIS ) 5 MG TABS tablet Take 1 tablet (5 mg total) by mouth 2 (two) times daily. 04/22/24   West, Katlyn D, NP  Ascorbic Acid  (VITAMIN C ) 1000 MG tablet Take 2,000 mg by mouth daily.    [provider]  atorvastatin  (LIPITOR ) 80 MG tablet TAKE 1 TABLET (80 MG TOTAL) BY MOUTH DAILY FOR CHOLESTEROL 07/09/24 07/09/25  Clark, Katherine  K, NP  busPIRone  (BUSPAR ) 10 MG tablet TAKE 1 TABLET (10 MG TOTAL) BY MOUTH 2 (TWO) TIMES DAILY. FOR ANXIETY 07/13/24   Gretta Comer POUR, NP  clopidogrel  (PLAVIX ) 75 MG tablet Take 75 mg by mouth daily.    [provider]  escitalopram  (LEXAPRO ) 20 MG tablet TAKE 1 TABLET BY MOUTH DAILY FOR ANXIETY 06/12/24   Clark, Katherine K, NP  ferrous sulfate  325 (65 FE) MG tablet Take 1 tablet (325 mg total) by mouth 2 (two) times daily. 05/05/24 05/05/25  Gherghe, Costin M, MD  fluticasone -salmeterol (WIXELA INHUB) 250-50 MCG/ACT AEPB Inhale 1 puff into the lungs in the morning and at bedtime. 04/07/24   Isadora Hose, MD  lisinopril  (ZESTRIL ) 30 MG tablet TAKE 1 TABLET (30 MG TOTAL) BY MOUTH DAILY FOR BLOOD PRESSURE. 04/27/24   Clark, Katherine K, NP  metoprolol  tartrate (LOPRESSOR ) 50 MG tablet Take 1 tablet (50 mg total) by mouth 2 (two) times daily. 03/19/24   West, Katlyn D, NP  pantoprazole  (PROTONIX ) 40 MG tablet TAKE 1 TABLET (40 MG TOTAL) BY MOUTH DAILY. FOR HEARTBURN 07/13/24   Clark, Katherine K, NP  pyridoxine  (B-6) 100 MG tablet Take 100 mg by mouth daily.    [provider]  sodium chloride  1 g tablet TAKE 1 TABLET (1 G TOTAL) BY MOUTH 2 (TWO) TIMES DAILY WITH A MEAL. TO INCREASE SODIUM 03/16/24   Clark, Katherine K, NP  Tiotropium Bromide Monohydrate  (SPIRIVA  RESPIMAT) 2.5 MCG/ACT AERS Inhale  2 puffs into the lungs daily. 04/07/24   Isadora Hose, MD  vitamin B-12 (CYANOCOBALAMIN ) 1000 MCG tablet Take 1,000 mcg by mouth daily.    [provider]  vitamin E  180 MG (400 UNITS) capsule Take 400 Units by mouth daily.    [provider]    Allergies: Patient has no known allergies.    Review of Systems  Respiratory:  Positive for shortness of breath.   Musculoskeletal:  Positive for back pain.    Updated Vital Signs BP 101/68 (BP Location: Left Arm)   Pulse 73   Temp 97.6 F (36.4 C) (Oral)   Resp 18   Ht 5' 3 (1.6 m)   Wt 75 kg   SpO2 97%   BMI 29.29  kg/m   Physical Exam Vitals and nursing note reviewed.  Constitutional:      General: She is not in acute distress.    Appearance: She is not toxic-appearing.  HENT:     Head: Normocephalic and atraumatic.  Eyes:     General: No scleral icterus.    Conjunctiva/sclera: Conjunctivae normal.  Cardiovascular:     Rate and Rhythm: Normal rate and regular rhythm.     Pulses: Normal pulses.     Heart sounds: Normal heart sounds.  Pulmonary:     Effort: Pulmonary effort is normal. No respiratory distress.     Breath sounds: Normal breath sounds.     Comments: No acute distress, no hypoxia.  No rash/swelling/bruising over area of pain.  Chest:     Chest wall: No tenderness.  Abdominal:     General: Abdomen is flat. Bowel sounds are normal.     Palpations: Abdomen is soft.     Tenderness: There is no abdominal tenderness.  Musculoskeletal:       Arms:     Comments: Mild bilateral lower ext edema, no pitting edema, reports baseline. No calf tenderness.   Skin:    General: Skin is warm and dry.     Findings: No lesion.  Neurological:     General: No focal deficit present.     Mental Status: She is alert and oriented to person, place, and time. Mental status is at baseline.     (all labs ordered are listed, but only abnormal results are displayed) Labs Reviewed  BASIC METABOLIC PANEL WITH GFR - Abnormal; Notable for the following components:      Result Value   Sodium 130 (*)    Chloride 97 (*)    BUN 6 (*)    Calcium  8.6 (*)    All other components within normal limits  CBC - Abnormal; Notable for the following components:   Hemoglobin 11.6 (*)    MCHC 29.2 (*)    RDW 18.4 (*)    Platelets 411 (*)    All other components within normal limits  RESP PANEL BY RT-PCR (RSV, FLU A&B, COVID)  [id number]  TROPONIN I (HIGH SENSITIVITY)  TROPONIN I (HIGH SENSITIVITY)    EKG: EKG Interpretation Date/Time:  Wednesday July 15 2024 20:59:25 EST Ventricular Rate:  70 PR  Interval:  160 QRS Duration:  78 QT Interval:  408 QTC Calculation: 440 R Axis:   60  Text Interpretation: Normal sinus rhythm with sinus arrhythmia Low voltage QRS Cannot rule out Anteroseptal infarct , age undetermined Abnormal ECG When compared with ECG of 02-May-2024 12:30, No significant change was found Confirmed by Raford Lenis ([id number]) on 07/15/2024 11:35:12 PM  Radiology: CT Angio Chest PE W and/or  Wo Contrast Result Date: 07/16/2024 CLINICAL DATA:  Chest pain and shortness of breath. Back pain. Possible pulmonary embolism. EXAM: CT ANGIOGRAPHY CHEST WITH CONTRAST TECHNIQUE: Multidetector CT imaging of the chest was performed using the standard protocol during bolus administration of intravenous contrast. Multiplanar CT image reconstructions and MIPs were obtained to evaluate the vascular anatomy. RADIATION DOSE REDUCTION: This exam was performed according to the departmental dose-optimization program which includes automated exposure control, adjustment of the mA and/or kV according to patient size and/or use of iterative reconstruction technique. CONTRAST:  75mL OMNIPAQUE  IOHEXOL  350 MG/ML SOLN COMPARISON:  CTs 05/02/2024, 12/05/2023, 04/24/2021 and PET-CT 03/30/2024 FINDINGS: Cardiovascular: Cardiomegaly. Calcified plaque over the 3 vessel coronary arteries. Thoracic aorta is normal in caliber. Calcified plaque over the descending thoracic aorta. Pulmonary arterial system is well opacified without evidence of emboli. Remaining vascular structures are unremarkable. Mediastinum/Nodes: No significant mediastinal or hilar adenopathy. Remaining mediastinal structures are normal. Lungs/Pleura: Lungs are adequately inflated. Moderate centrilobular emphysematous disease is present. Left lower lobe nodule measuring 1.2 x 1.6 cm unchanged from 12/05/2023 although increased in size compared to 2022. This demonstrates no significant hypermetabolic activity on previous PET-CT. Minimal nodularity in the  posterior subpleural location over the left upper lobe likely scarring. Minimal ill-defined focal opacification of the posteromedial right lower lobe likely atelectasis although cannot exclude early infection. No effusion. Airways are unremarkable. Upper Abdomen: Calcified plaque over the visualized upper abdominal aorta. Previous cholecystectomy. Upper abdomen otherwise unchanged. No acute findings. Musculoskeletal: Subtle stable loss of vertebral body height of 2 adjacent thoracic vertebrae. Review of the MIP images confirms the above findings. IMPRESSION: 1. No evidence of pulmonary embolism. 2. Minimal ill-defined focal opacification of the posteromedial right lower lobe likely atelectasis, although cannot exclude early infection. 3. 1.2 x 1.6 cm left lower lobe nodule unchanged from 12/05/2023, although increased in size compared to 2022. This demonstrates no significant hypermetabolic activity on previous PET-CT. Agree with prior recommendations of continued CT surveillance 6-12 months. 4. Emphysema. 5. Aortic atherosclerosis. Atherosclerotic coronary artery disease. Aortic Atherosclerosis (ICD10-I70.0) and Emphysema (ICD10-J43.9). Electronically Signed   By: Toribio Agreste M.D.   On: 07/16/2024 12:55   DG Chest 2 View Result Date: 07/15/2024 CLINICAL DATA:  Chest pain EXAM: CHEST - 2 VIEW COMPARISON:  05/02/2024, PET CT 03/30/2024 FINDINGS: Cardiomegaly with small pleural effusions. Diffuse bronchitic changes. Old right-sided rib fractures. Emphysema. Left mid lung/lower lobe pulmonary nodule measuring 17 mm radiographically. IMPRESSION: 1. Cardiomegaly with small pleural effusions. 2. Emphysema with diffuse bronchitic changes. 3. Left mid lung/lower lobe pulmonary nodule measuring 17 mm radiographically. Reference chest CT from September and PET CT from August Electronically Signed   By: Luke Bun M.D.   On: 07/15/2024 21:39     Procedures   Medications Ordered in the ED   oxyCODONE -acetaminophen  (PERCOCET/ROXICET) 5-325 MG per tablet 1 tablet (1 tablet Oral Given 07/16/24 9361)  ipratropium-albuterol  (DUONEB) 0.5-2.5 (3) MG/3ML nebulizer solution 3 mL (3 mLs Nebulization Given 07/16/24 0942)  iohexol  (OMNIPAQUE ) 350 MG/ML injection 75 mL (75 mLs Intravenous Contrast Given 07/16/24 1227)                                    Medical Decision Making Amount and/or Complexity of Data Reviewed Labs: ordered. Radiology: ordered.  Risk Prescription drug management.   This patient presents to the ED for concern of chest pain, this involves an extensive number of treatment options, and is  a complaint that carries with it a high risk of complications and morbidity.  The differential diagnosis includes ACS, stable angina, CHF, pneumonia, pneumothorax, aortic dissection, pulmonary embolus    Co morbidities that complicate the patient evaluation  COPD, hypertension, hyperlipidemia, carotid artery stent on Plavix , A-fib on Eliquis , history of pulmonary nodule, former smoker   Additional history obtained:  Additional history obtained from reviewed patient's hospital stay 05/05/2024 for GI bleed (close was temporarily held for a few days and then restarted prior to discharge) and most recent cardiology visit which was 07/08/2024 -- echo recently showed normal function of left ventricle EF 60 to 65%.   Lab Tests:  I personally interpreted labs.  The pertinent results include:   CBC shows a hemoglobin of 11.6 slightly improved since prior recent labs 1 month ago BMP shows mild hyponatremia of 130 which is stable and appears chronic Troponin 6 Resp panel negative.     Imaging Studies ordered:  I ordered imaging studies including chest x-ray/ CTA chest I independently visualized and interpreted imaging which showed chest x-ray shows cardiomegaly with bilateral pleural effusion, stable pulmonary nodule, bronchitic changes. CTA chest showing no pulmonary embolism,  ill-defined opacification of the right lower lobe which is atelectasis versus infection, emphysema, arthrosclerosis, stable pulmonary nodule. I agree with the radiologist interpretation   Cardiac Monitoring: / EKG:  The patient was maintained on a cardiac monitor.  I personally viewed and interpreted the cardiac monitored which showed an underlying rhythm of: normal sinus, no ST changes - no in afib.     Problem List / ED Course / Critical interventions / Medication management  Patient presents to emergency room with complaint of lower back pain, cough and shortness of breath with exertion.  Patient denies any specific chest pain.  On arrival hemodynamically stable well-appearing.  Speaking in full sentences not hypoxic in no acute distress.  She denies having fever or chills.  EKG was obtained which shows normal sinus rhythm no ST changes and troponin x 2, story seems less consistent with ACS at this time.  I did obtain chest x-ray and CT scan.  Her CT scan shows no pulmonary embolism, does appear to have some early developing right lower lobe pneumonia which seems consistent with patient's presentation, she also has emphysema.  No significant pleural effusion noted on CT scan. She has no sign of fluid overload on exam.  Symptoms are not consistent with aortic dissection. Work up concerning for early pneumonia. No sign of systemic illness.  White count is 10.1 and she has remained afebrile throughout stay.  Not meeting SIRS/sepsis criteria.  Stable for discharge and trial of oral antibiotics outpatient.  She was given follow-up instructions and return precautions.      Final diagnoses:  Pneumonia of right lower lobe due to infectious organism    ED Discharge Orders          Ordered    amoxicillin -clavulanate (AUGMENTIN ) 875-125 MG tablet  Every 12 hours        07/16/24 1309    azithromycin (ZITHROMAX) 250 MG tablet  Daily        07/16/24 1309               Sakiya Stepka, Warren SAILOR,  PA-C 07/16/24 1318    Doretha Folks, MD 07/17/24 [phone]

## 2024-07-21 ENCOUNTER — Encounter: Payer: Self-pay | Admitting: Primary Care

## 2024-07-21 ENCOUNTER — Ambulatory Visit: Admitting: Primary Care

## 2024-07-21 VITALS — BP 136/66 | HR 83 | Temp 98.3°F | Ht 63.0 in | Wt 165.0 lb

## 2024-07-21 DIAGNOSIS — J189 Pneumonia, unspecified organism: Secondary | ICD-10-CM

## 2024-07-21 DIAGNOSIS — Z8719 Personal history of other diseases of the digestive system: Secondary | ICD-10-CM | POA: Diagnosis not present

## 2024-07-21 DIAGNOSIS — D509 Iron deficiency anemia, unspecified: Secondary | ICD-10-CM | POA: Diagnosis not present

## 2024-07-21 MED ORDER — FERROUS SULFATE 325 (65 FE) MG PO TABS: 325.0000 mg | ORAL_TABLET | Freq: Two times a day (BID) | ORAL | 1 refills | Status: AC

## 2024-07-21 MED ORDER — PREDNISONE 20 MG PO TABS: ORAL_TABLET | ORAL | 0 refills | Status: DC

## 2024-07-21 NOTE — Assessment & Plan Note (Signed)
 With recent ED visit. ED notes, labs, imaging reviewed  Complete antibiotics as prescribed Start prednisone  20 mg tablets. Take 2 tablets by mouth once daily in the morning for 5 days. We discussed that she could use Mucinex  as needed.  She will update if symptoms do not improve  Repeat chest x-ray in 3 weeks.

## 2024-07-21 NOTE — Patient Instructions (Signed)
 Start prednisone  20 mg tablets. Take 2 tablets by mouth once daily in the morning for 5 days.  Complete the antibiotics as discussed.  Return in 3 weeks for chest x-ray.  You do not need an appointment.  It was a pleasure to see you today!

## 2024-07-21 NOTE — Progress Notes (Signed)
 Subjective:    Patient ID: Denise [name], female    DOB: 12/09/1960, 64 y.o.   MRN: [id number]  Denise [name] is a very pleasant 63 y.o. female with a history of hypertension, cardiomegaly, CAD, AVM of colon, carotid artery disease, atrial flutter, COPD, SIADH, hyperlipidemia, iron  deficiency anemia, seizure who presents today for ED follow-up.  She is also needing a refill of her ferrous sulfate  pills  She presented to [facility] ED on 07/15/2024 for productive cough, exertional dyspnea and mid back pain.  During her stay in the ED she underwent chest x-ray which showed cardiomegaly with bilateral pleural effusion, stable pulmonary nodule, bronchitic changes.  CTA chest without PE, but there was an ill-defined opacification to the right lower lobe for which infection cannot be ruled out.  Lab work with mild anemia, mild hyponatremia. She was discharged home later that day with prescription for Augmentin  BID x 7 days and azithromycin course.  Since her discharge home she's compliant to her antibiotics as prescribed. She continues to experience mid lower thoracic back pain. She denies strenuous activity, injury. She continues to experience chest tightness. She's had a difficult time coughing up mucous.     Review of Systems  Constitutional:  Negative for chills and fever.  Respiratory:  Positive for cough, chest tightness and shortness of breath.          Past Medical History:  Diagnosis Date   Allergy    Anemia 12/24/2019   Anxiety    Aortic insufficiency    Aortic stenosis    Arthritis    Carotid artery disease    Cataract 12/25/2019   Centrilobular emphysema (HCC) 11/2023   Chickenpox    Cholecystitis with cholelithiasis 03/31/2012   Closed fracture of L4 transverse process s/p traumatic mechanical fall 10/26/2023   COPD (chronic obstructive pulmonary disease) (HCC)    Coronary artery disease    Depression    Diastolic dysfunction    Dyspnea    with exertion   Emphysema  of lung (HCC) not sure   GERD (gastroesophageal reflux disease)    Headache    Heart murmur    High anion gap metabolic acidosis 10/26/2023   History of blood transfusion 2021   HLD (hyperlipidemia)    Hypertension 03/31/2012   Hyponatremia in setting of SIADH    Left lower lobe pulmonary nodule 11/2023   Long-term use of aspirin  therapy    Lumbar compression fracture s/p traumatic mechanical fall; L4 superior endplate    Pneumonia    x 2   PVD (peripheral vascular disease)    Shingles    SIADH (syndrome of inappropriate ADH production)    Traumatic mechanical fall 10/26/2023   a.) s/p traumatic fall 10/26/2023 reuslting in multiple RIGHT rib (5th - 7th) fractures (required dchest tube), l$ transverse process fracture, L4 superior endplate compression fracture, large volume post-traumatic myofacial/subcutaneous gas collection in chest    Social History   Socioeconomic History   Marital status: Widowed    Spouse name: Not on file   Number of children: 2   Years of education: Not on file   Highest education level: 12th grade  Occupational History   Occupation: Clinical Biochemist Rep  Tobacco Use   Smoking status: Former    Current packs/day: 0.00    Average packs/day: 0.8 packs/day for 38.0 years (28.5 ttl pk-yrs)    Types: Cigarettes    Start date: 05/06/1985    Quit date: 05/07/2023    Years since quitting: 1.2  Smokeless tobacco: Never  Vaping Use   Vaping status: Never Used  Substance and Sexual Activity   Alcohol use: Yes    Alcohol/week: 1.0 standard drink of alcohol    Types: 1 Standard drinks or equivalent per week    Comment: occasionally   Drug use: No   Sexual activity: Not Currently    Birth control/protection: Post-menopausal  Other Topics Concern   Not on file  Social History Narrative   Widow.    2 children.   Works in Clinical Biochemist. Currently on disability as of 04/2023.   Enjoys reading, cooking.    Social Drivers of Health   Tobacco Use:  Medium Risk (07/21/2024)   Patient History    Smoking Tobacco Use: Former    Smokeless Tobacco Use: Never    Passive Exposure: Not on file  Financial Resource Strain: Patient Declined (01/28/2024)   Overall Financial Resource Strain (CARDIA)    Difficulty of Paying Living Expenses: Patient declined  Food Insecurity: No Food Insecurity (05/02/2024)   Epic    Worried About Programme Researcher, Broadcasting/film/video in the Last Year: Never true    Ran Out of Food in the Last Year: Never true  Recent Concern: Food Insecurity - Food Insecurity Present (02/19/2024)   Epic    Worried About Programme Researcher, Broadcasting/film/video in the Last Year: Sometimes true    [employer] of Food in the Last Year: Sometimes true  Transportation Needs: Unmet Transportation Needs (05/02/2024)   Epic    Lack of Transportation (Medical): Yes    Lack of Transportation (Non-Medical): No  Physical Activity: Insufficiently Active (01/28/2024)   Exercise Vital Sign    Days of Exercise per Week: 4 days    Minutes of Exercise per Session: 20 min  Stress: Stress Concern Present (01/28/2024)   [name] of Occupational Health - Occupational Stress Questionnaire    Feeling of Stress: Very much  Social Connections: Moderately Isolated (05/02/2024)   Social Connection and Isolation Panel    Frequency of Communication with Friends and Family: More than three times a week    Frequency of Social Gatherings with Friends and Family: More than three times a week    Attends Religious Services: 1 to 4 times per year    Active Member of [employer] or Organizations: No    Attends Banker Meetings: Never    Marital Status: Widowed  Intimate Partner Violence: Not At Risk (05/02/2024)   Epic    Fear of Current or Ex-Partner: No    Emotionally Abused: No    Physically Abused: No    Sexually Abused: No  Depression (PHQ2-9): Medium Risk (07/21/2024)   Depression (PHQ2-9)    PHQ-2 Score: 10  Alcohol Screen: Low Risk (01/28/2024)   Alcohol Screen    Last Alcohol  Screening Score (AUDIT): 2  Housing: High Risk (05/02/2024)   Epic    Unable to Pay for Housing in the Last Year: Yes    Number of Times Moved in the Last Year: 0    Homeless in the Last Year: No  Utilities: Not At Risk (05/02/2024)   Epic    Threatened with loss of utilities: No  Health Literacy: Not on file    Past Surgical History:  Procedure Laterality Date   BIOPSY  12/24/2019   Procedure: BIOPSY;  Surgeon: Teressa Toribio SQUIBB, MD;  Location: [facility] ENDOSCOPY;  Service: Endoscopy;;   BRONCHIAL BIOPSY  12/19/2023   Procedure: BRONCHOSCOPY, WITH BIOPSY;  Surgeon: Isadora Hose, MD;  Location: [facility] ENDOSCOPY;  Service: Pulmonary;;   BRONCHIAL BRUSHINGS  12/19/2023   Procedure: BRONCHOSCOPY, WITH BRUSH BIOPSY;  Surgeon: Isadora Hose, MD;  Location: [facility] ENDOSCOPY;  Service: Pulmonary;;   BRONCHIAL NEEDLE ASPIRATION BIOPSY  12/19/2023   Procedure: BRONCHOSCOPY, WITH NEEDLE ASPIRATION BIOPSY;  Surgeon: Isadora Hose, MD;  Location: [facility] ENDOSCOPY;  Service: Pulmonary;;   BRONCHIAL WASHINGS  12/19/2023   Procedure: IRRIGATION, BRONCHUS;  Surgeon: Isadora Hose, MD;  Location: [facility] ENDOSCOPY;  Service: Pulmonary;;   BRONCHOSCOPY, WITH BIOPSY USING ELECTROMAGNETIC NAVIGATION Bilateral 12/19/2023   Procedure: ROBOTIC ASSISTED NAVIGATIONAL BRONCHOSCOPY;  Surgeon: Isadora Hose, MD;  Location: [facility] ENDOSCOPY;  Service: Pulmonary;  Laterality: Bilateral;   CATARACT EXTRACTION Bilateral 2021   CHOLECYSTECTOMY  03/30/2012   Procedure: LAPAROSCOPIC CHOLECYSTECTOMY WITH INTRAOPERATIVE CHOLANGIOGRAM;  Surgeon: Morene ONEIDA Olives, MD;  Location: [facility] ORS;  Service: General;  Laterality: N/A;   COLONOSCOPY N/A 05/05/2024   Procedure: COLONOSCOPY;  Surgeon: San Sandor GAILS, DO;  Location: [facility] ENDOSCOPY;  Service: Gastroenterology;  Laterality: N/A;   COLONOSCOPY WITH PROPOFOL  N/A 12/24/2019   Procedure: COLONOSCOPY WITH PROPOFOL ;  Surgeon: Teressa Toribio SQUIBB, MD;  Location[facility] ENDOSCOPY;  Service: Endoscopy;   Laterality: N/A;   ESOPHAGOGASTRODUODENOSCOPY N/A 05/05/2024   Procedure: EGD (ESOPHAGOGASTRODUODENOSCOPY);  Surgeon: San Sandor GAILS, DO;  Location: [facility] ENDOSCOPY;  Service: Gastroenterology;  Laterality: N/A;   ESOPHAGOGASTRODUODENOSCOPY (EGD) WITH PROPOFOL  N/A 12/24/2019   Procedure: ESOPHAGOGASTRODUODENOSCOPY (EGD) WITH PROPOFOL ;  Surgeon: Teressa Toribio SQUIBB, MD;  Location: [facility] ENDOSCOPY;  Service: Endoscopy;  Laterality: N/A;   EYE SURGERY  01/22/2020   HOT HEMOSTASIS N/A 12/24/2019   Procedure: HOT HEMOSTASIS (ARGON PLASMA COAGULATION/BICAP);  Surgeon: Teressa Toribio SQUIBB, MD;  Location: [facility] ENDOSCOPY;  Service: Endoscopy;  Laterality: N/A;   HOT HEMOSTASIS N/A 05/05/2024   Procedure: EGD, WITH ARGON PLASMA COAGULATION;  Surgeon: San Sandor GAILS, DO;  Location: [facility] ENDOSCOPY;  Service: Gastroenterology;  Laterality: N/A;   POLYPECTOMY  12/24/2019   Procedure: POLYPECTOMY;  Surgeon: Teressa Toribio SQUIBB, MD;  Location: [facility] ENDOSCOPY;  Service: Endoscopy;;   POLYPECTOMY  05/05/2024   Procedure: POLYPECTOMY, INTESTINE;  Surgeon: San Sandor GAILS, DO;  Location: [facility] ENDOSCOPY;  Service: Gastroenterology;;   TRANSCAROTID ARTERY REVASCULARIZATION  Left 02/12/2024   Procedure: TRANSCAROTID ARTERY REVASCULARIZATION (TCAR);  Surgeon: Gretta Lonni PARAS, MD;  Location: [facility] OR;  Service: Vascular;  Laterality: Left;   TUBAL LIGATION  1997   VIDEO BRONCHOSCOPY WITH ENDOBRONCHIAL ULTRASOUND  12/19/2023   Procedure: BRONCHOSCOPY, WITH EBUS;  Surgeon: Isadora Hose, MD;  Location: [facility] ENDOSCOPY;  Service: Pulmonary;;   VIDEO BRONCHOSCOPY WITH RADIAL ENDOBRONCHIAL ULTRASOUND  12/19/2023   Procedure: VIDEO BRONCHOSCOPY WITH RADIAL ENDOBRONCHIAL ULTRASOUND;  Surgeon: Isadora Hose, MD;  Location: [facility] ENDOSCOPY;  Service: Pulmonary;;    Family History  Problem Relation Age of Onset   Arthritis Mother    Cervical cancer Mother    Cancer Mother    Heart disease Father    Hypertension Father    Heart attack  Father     Allergies[1]  Medications Ordered Prior to Encounter[2]  BP 136/66   Pulse 83   Temp 98.3 F (36.8 C) (Oral)   Ht 5' 3 (1.6 m)   Wt 165 lb (74.8 kg)   SpO2 94%   BMI 29.23 kg/m  Objective:   Physical Exam Cardiovascular:     Rate and Rhythm: Normal rate and regular rhythm.  Pulmonary:     Effort: Pulmonary effort is normal.     Breath sounds: Examination of the right-upper  field reveals rhonchi. Examination of the left-upper field reveals rhonchi. Rhonchi present.  Musculoskeletal:     Cervical back: Neck supple.  Skin:    General: Skin is warm and dry.  Neurological:     Mental Status: She is alert and oriented to person, place, and time.  Psychiatric:        Mood and Affect: Mood normal.     Physical Exam        Assessment & Plan:  Community acquired pneumonia of right lower lobe of lung Assessment & Plan: With recent ED visit. ED notes, labs, imaging reviewed  Complete antibiotics as prescribed Start prednisone  20 mg tablets. Take 2 tablets by mouth once daily in the morning for 5 days. We discussed that she could use Mucinex  as needed.  She will update if symptoms do not improve  Repeat chest x-ray in 3 weeks.  Orders: -     predniSONE ; Take 2 tablets by mouth once daily in the morning for 5 days.  Dispense: 10 tablet; Refill: 0 -     DG Chest 2 View; Future  History of rectal bleeding -     Ferrous Sulfate ; Take 1 tablet (325 mg total) by mouth 2 (two) times daily. For iron   Dispense: 180 tablet; Refill: 1  Iron  deficiency anemia, unspecified iron  deficiency anemia type -     Ferrous Sulfate ; Take 1 tablet (325 mg total) by mouth 2 (two) times daily. For iron   Dispense: 180 tablet; Refill: 1    Assessment and Plan Assessment & Plan         Comer MARLA Gaskins, NP       [1] No Known Allergies [2]  Current Outpatient Medications on File Prior to Visit  Medication Sig Dispense Refill   acetaminophen  (TYLENOL ) 500 MG  tablet Take 2 tablets (1,000 mg total) by mouth every 8 (eight) hours as needed.     albuterol  (VENTOLIN  HFA) 108 (90 Base) MCG/ACT inhaler INHALE 2 PUFFS INTO THE LUNGS EVERY 4-6 HOURS AS NEEDED FOR TIGHTNESS/WHEEZING 8.5 each 0   amLODipine  (NORVASC ) 10 MG tablet Take 1 tablet (10 mg total) by mouth daily. 90 tablet 3   amoxicillin -clavulanate (AUGMENTIN ) 875-125 MG tablet Take 1 tablet by mouth every 12 (twelve) hours. 14 tablet 0   apixaban  (ELIQUIS ) 5 MG TABS tablet Take 1 tablet (5 mg total) by mouth 2 (two) times daily. 60 tablet 6   Ascorbic Acid  (VITAMIN C ) 1000 MG tablet Take 2,000 mg by mouth daily.     atorvastatin  (LIPITOR ) 80 MG tablet TAKE 1 TABLET (80 MG TOTAL) BY MOUTH DAILY FOR CHOLESTEROL 90 tablet 2   azithromycin (ZITHROMAX) 250 MG tablet Take 1 tablet (250 mg total) by mouth daily. Take first 2 tablets together, then 1 every day until finished. 6 tablet 0   busPIRone  (BUSPAR ) 10 MG tablet TAKE 1 TABLET (10 MG TOTAL) BY MOUTH 2 (TWO) TIMES DAILY. FOR ANXIETY 180 tablet 2   clopidogrel  (PLAVIX ) 75 MG tablet Take 75 mg by mouth daily.     escitalopram  (LEXAPRO ) 20 MG tablet TAKE 1 TABLET BY MOUTH DAILY FOR ANXIETY 90 tablet 2   fluticasone -salmeterol (WIXELA INHUB) 250-50 MCG/ACT AEPB Inhale 1 puff into the lungs in the morning and at bedtime. 60 each 12   lisinopril  (ZESTRIL ) 30 MG tablet TAKE 1 TABLET (30 MG TOTAL) BY MOUTH DAILY FOR BLOOD PRESSURE. 90 tablet 2   metoprolol  tartrate (LOPRESSOR ) 50 MG tablet Take 1 tablet (50 mg total) by mouth 2 (  two) times daily. 180 tablet 3   pantoprazole  (PROTONIX ) 40 MG tablet TAKE 1 TABLET (40 MG TOTAL) BY MOUTH DAILY. FOR HEARTBURN 90 tablet 2   pyridoxine  (B-6) 100 MG tablet Take 100 mg by mouth daily.     sodium chloride  1 g tablet TAKE 1 TABLET (1 G TOTAL) BY MOUTH 2 (TWO) TIMES DAILY WITH A MEAL. TO INCREASE SODIUM 180 tablet 0   Tiotropium Bromide Monohydrate  (SPIRIVA  RESPIMAT) 2.5 MCG/ACT AERS Inhale 2 puffs into the lungs daily. 60  each 12   vitamin B-12 (CYANOCOBALAMIN ) 1000 MCG tablet Take 1,000 mcg by mouth daily.     vitamin E  180 MG (400 UNITS) capsule Take 400 Units by mouth daily.     No current facility-administered medications on file prior to visit.

## 2024-07-21 NOTE — Telephone Encounter (Signed)
 Copied from CRM [id number]. Topic: Medical Record Request - Records Request >> May 20, 2024  3:21 PM Harlene ORN wrote: Reason for CRM: Wanda - [place]  [employer] Sent over a request for medical records. Checking on the status. Phone: [phone] ext. [phone]

## 2024-07-30 ENCOUNTER — Inpatient Hospital Stay (HOSPITAL_COMMUNITY)
Admission: EM | Admit: 2024-07-30 | Discharge: 2024-08-02 | DRG: 193 | Disposition: A | Discharge status: Home / Self Care | Attending: Internal Medicine | Admitting: Internal Medicine

## 2024-07-30 ENCOUNTER — Emergency Department (HOSPITAL_COMMUNITY)

## 2024-07-30 ENCOUNTER — Other Ambulatory Visit: Payer: Self-pay

## 2024-07-30 ENCOUNTER — Observation Stay (HOSPITAL_COMMUNITY)

## 2024-07-30 ENCOUNTER — Encounter (HOSPITAL_COMMUNITY): Payer: Self-pay

## 2024-07-30 DIAGNOSIS — D649 Anemia, unspecified: Secondary | ICD-10-CM | POA: Diagnosis present

## 2024-07-30 DIAGNOSIS — Z8261 Family history of arthritis: Secondary | ICD-10-CM

## 2024-07-30 DIAGNOSIS — I509 Heart failure, unspecified: Secondary | ICD-10-CM

## 2024-07-30 DIAGNOSIS — Z7951 Long term (current) use of inhaled steroids: Secondary | ICD-10-CM

## 2024-07-30 DIAGNOSIS — F32A Depression, unspecified: Secondary | ICD-10-CM | POA: Diagnosis present

## 2024-07-30 DIAGNOSIS — I2510 Atherosclerotic heart disease of native coronary artery without angina pectoris: Secondary | ICD-10-CM | POA: Diagnosis present

## 2024-07-30 DIAGNOSIS — R0602 Shortness of breath: Secondary | ICD-10-CM | POA: Diagnosis present

## 2024-07-30 DIAGNOSIS — I480 Paroxysmal atrial fibrillation: Secondary | ICD-10-CM | POA: Diagnosis present

## 2024-07-30 DIAGNOSIS — I5030 Unspecified diastolic (congestive) heart failure: Secondary | ICD-10-CM

## 2024-07-30 DIAGNOSIS — Z720 Tobacco use: Secondary | ICD-10-CM

## 2024-07-30 DIAGNOSIS — J432 Centrilobular emphysema: Secondary | ICD-10-CM | POA: Diagnosis present

## 2024-07-30 DIAGNOSIS — Z8249 Family history of ischemic heart disease and other diseases of the circulatory system: Secondary | ICD-10-CM

## 2024-07-30 DIAGNOSIS — Z87891 Personal history of nicotine dependence: Secondary | ICD-10-CM

## 2024-07-30 DIAGNOSIS — J09X1 Influenza due to identified novel influenza A virus with pneumonia: Secondary | ICD-10-CM | POA: Diagnosis present

## 2024-07-30 DIAGNOSIS — K31819 Angiodysplasia of stomach and duodenum without bleeding: Secondary | ICD-10-CM | POA: Diagnosis present

## 2024-07-30 DIAGNOSIS — J449 Chronic obstructive pulmonary disease, unspecified: Secondary | ICD-10-CM | POA: Diagnosis present

## 2024-07-30 DIAGNOSIS — Z9049 Acquired absence of other specified parts of digestive tract: Secondary | ICD-10-CM

## 2024-07-30 DIAGNOSIS — Z8719 Personal history of other diseases of the digestive system: Secondary | ICD-10-CM

## 2024-07-30 DIAGNOSIS — F419 Anxiety disorder, unspecified: Secondary | ICD-10-CM | POA: Diagnosis present

## 2024-07-30 DIAGNOSIS — J1001 Influenza due to other identified influenza virus with the same other identified influenza virus pneumonia: Principal | ICD-10-CM | POA: Diagnosis present

## 2024-07-30 DIAGNOSIS — I6522 Occlusion and stenosis of left carotid artery: Secondary | ICD-10-CM | POA: Diagnosis present

## 2024-07-30 DIAGNOSIS — J101 Influenza due to other identified influenza virus with other respiratory manifestations: Secondary | ICD-10-CM

## 2024-07-30 DIAGNOSIS — J9601 Acute respiratory failure with hypoxia: Secondary | ICD-10-CM | POA: Diagnosis not present

## 2024-07-30 DIAGNOSIS — J441 Chronic obstructive pulmonary disease with (acute) exacerbation: Secondary | ICD-10-CM | POA: Diagnosis present

## 2024-07-30 DIAGNOSIS — I110 Hypertensive heart disease with heart failure: Secondary | ICD-10-CM | POA: Diagnosis present

## 2024-07-30 DIAGNOSIS — D500 Iron deficiency anemia secondary to blood loss (chronic): Secondary | ICD-10-CM | POA: Diagnosis present

## 2024-07-30 DIAGNOSIS — R7401 Elevation of levels of liver transaminase levels: Secondary | ICD-10-CM | POA: Diagnosis present

## 2024-07-30 DIAGNOSIS — Z1152 Encounter for screening for COVID-19: Secondary | ICD-10-CM

## 2024-07-30 DIAGNOSIS — R0902 Hypoxemia: Secondary | ICD-10-CM

## 2024-07-30 DIAGNOSIS — Z7982 Long term (current) use of aspirin: Secondary | ICD-10-CM

## 2024-07-30 DIAGNOSIS — E222 Syndrome of inappropriate secretion of antidiuretic hormone: Secondary | ICD-10-CM | POA: Diagnosis present

## 2024-07-30 DIAGNOSIS — I5033 Acute on chronic diastolic (congestive) heart failure: Secondary | ICD-10-CM | POA: Diagnosis present

## 2024-07-30 DIAGNOSIS — Z634 Disappearance and death of family member: Secondary | ICD-10-CM

## 2024-07-30 DIAGNOSIS — E785 Hyperlipidemia, unspecified: Secondary | ICD-10-CM | POA: Diagnosis present

## 2024-07-30 DIAGNOSIS — Z7902 Long term (current) use of antithrombotics/antiplatelets: Secondary | ICD-10-CM

## 2024-07-30 DIAGNOSIS — Z79899 Other long term (current) drug therapy: Secondary | ICD-10-CM

## 2024-07-30 DIAGNOSIS — Z8049 Family history of malignant neoplasm of other genital organs: Secondary | ICD-10-CM

## 2024-07-30 DIAGNOSIS — E871 Hypo-osmolality and hyponatremia: Principal | ICD-10-CM

## 2024-07-30 DIAGNOSIS — K219 Gastro-esophageal reflux disease without esophagitis: Secondary | ICD-10-CM | POA: Diagnosis present

## 2024-07-30 DIAGNOSIS — J440 Chronic obstructive pulmonary disease with acute lower respiratory infection: Secondary | ICD-10-CM | POA: Diagnosis present

## 2024-07-30 DIAGNOSIS — I10 Essential (primary) hypertension: Secondary | ICD-10-CM | POA: Diagnosis present

## 2024-07-30 DIAGNOSIS — I352 Nonrheumatic aortic (valve) stenosis with insufficiency: Secondary | ICD-10-CM | POA: Diagnosis present

## 2024-07-30 DIAGNOSIS — R911 Solitary pulmonary nodule: Secondary | ICD-10-CM | POA: Diagnosis present

## 2024-07-30 DIAGNOSIS — Z7901 Long term (current) use of anticoagulants: Secondary | ICD-10-CM

## 2024-07-30 LAB — URINALYSIS, ROUTINE W REFLEX MICROSCOPIC
Bacteria, UA: NONE SEEN
Bilirubin Urine: NEGATIVE
Glucose, UA: NEGATIVE mg/dL
Hgb urine dipstick: NEGATIVE
Ketones, ur: NEGATIVE mg/dL
Leukocytes,Ua: NEGATIVE
Nitrite: NEGATIVE
Protein, ur: 30 mg/dL — AB
Specific Gravity, Urine: 1.011 (ref 1.005–1.030)
pH: 7 (ref 5.0–8.0)

## 2024-07-30 LAB — SODIUM: Sodium: 121 mmol/L — ABNORMAL LOW (ref 135–145)

## 2024-07-30 LAB — COMPREHENSIVE METABOLIC PANEL WITH GFR
ALT: 126 U/L — ABNORMAL HIGH (ref 0–44)
AST: 171 U/L — ABNORMAL HIGH (ref 15–41)
Albumin: 4.3 g/dL (ref 3.5–5.0)
Alkaline Phosphatase: 251 U/L — ABNORMAL HIGH (ref 38–126)
Anion gap: 10 (ref 5–15)
BUN: <5 mg/dL — ABNORMAL LOW (ref 8–23)
CO2: 26 mmol/L (ref 22–32)
Calcium: 8.7 mg/dL — ABNORMAL LOW (ref 8.9–10.3)
Chloride: 83 mmol/L — ABNORMAL LOW (ref 98–111)
Creatinine, Ser: 0.38 mg/dL — ABNORMAL LOW (ref 0.44–1.00)
GFR, Estimated: >60 mL/min
Glucose, Bld: 110 mg/dL — ABNORMAL HIGH (ref 70–99)
Potassium: 4.1 mmol/L (ref 3.5–5.1)
Sodium: 120 mmol/L — ABNORMAL LOW (ref 135–145)
Total Bilirubin: 0.4 mg/dL (ref 0.0–1.2)
Total Protein: 7.3 g/dL (ref 6.5–8.1)

## 2024-07-30 LAB — ECHOCARDIOGRAM COMPLETE
AR max vel: 1.19 cm2
AV Area VTI: 1.31 cm2
AV Area mean vel: 1.14 cm2
AV Mean grad: 22 mmHg
AV Peak grad: 39.7 mmHg
Ao pk vel: 3.15 m/s
Area-P 1/2: 4.8 cm2
Height: 63 in
S' Lateral: 3 cm
Weight: 2640 [oz_av]

## 2024-07-30 LAB — CBC WITH DIFFERENTIAL/PLATELET
Abs Immature Granulocytes: 0.04 K/uL (ref 0.00–0.07)
Basophils Absolute: 0 K/uL (ref 0.0–0.1)
Basophils Relative: 0 %
Eosinophils Absolute: 0 K/uL (ref 0.0–0.5)
Eosinophils Relative: 0 %
HCT: 43.9 % (ref 36.0–46.0)
Hemoglobin: 13.6 g/dL (ref 12.0–15.0)
Immature Granulocytes: 1 %
Lymphocytes Relative: 3 %
Lymphs Abs: 0.2 K/uL — ABNORMAL LOW (ref 0.7–4.0)
MCH: 28.5 pg (ref 26.0–34.0)
MCHC: 31 g/dL (ref 30.0–36.0)
MCV: 91.8 fL (ref 80.0–100.0)
Monocytes Absolute: 0.5 K/uL (ref 0.1–1.0)
Monocytes Relative: 7 %
Neutro Abs: 6.2 K/uL (ref 1.7–7.7)
Neutrophils Relative %: 89 %
Platelets: 273 K/uL (ref 150–400)
RBC: 4.78 MIL/uL (ref 3.87–5.11)
RDW: 18.6 % — ABNORMAL HIGH (ref 11.5–15.5)
WBC: 7 K/uL (ref 4.0–10.5)
nRBC: 0 % (ref 0.0–0.2)

## 2024-07-30 LAB — I-STAT VENOUS BLOOD GAS, ED
Acid-Base Excess: 2 mmol/L (ref 0.0–2.0)
Bicarbonate: 27.3 mmol/L (ref 20.0–28.0)
Calcium, Ion: 1.07 mmol/L — ABNORMAL LOW (ref 1.15–1.40)
HCT: 49 % — ABNORMAL HIGH (ref 36.0–46.0)
Hemoglobin: 16.7 g/dL — ABNORMAL HIGH (ref 12.0–15.0)
O2 Saturation: 99 %
Potassium: 4 mmol/L (ref 3.5–5.1)
Sodium: 120 mmol/L — ABNORMAL LOW (ref 135–145)
TCO2: 29 mmol/L (ref 22–32)
pCO2, Ven: 45 mmHg (ref 44–60)
pH, Ven: 7.392 (ref 7.25–7.43)
pO2, Ven: 157 mmHg — ABNORMAL HIGH (ref 32–45)

## 2024-07-30 LAB — I-STAT CHEM 8, ED
BUN: <3 mg/dL — ABNORMAL LOW (ref 8–23)
Calcium, Ion: 1.03 mmol/L — ABNORMAL LOW (ref 1.15–1.40)
Chloride: 84 mmol/L — ABNORMAL LOW (ref 98–111)
Creatinine, Ser: 0.4 mg/dL — ABNORMAL LOW (ref 0.44–1.00)
Glucose, Bld: 113 mg/dL — ABNORMAL HIGH (ref 70–99)
HCT: 49 % — ABNORMAL HIGH (ref 36.0–46.0)
Hemoglobin: 16.7 g/dL — ABNORMAL HIGH (ref 12.0–15.0)
Potassium: 4 mmol/L (ref 3.5–5.1)
Sodium: 120 mmol/L — ABNORMAL LOW (ref 135–145)
TCO2: 27 mmol/L (ref 22–32)

## 2024-07-30 LAB — HEPATITIS PANEL, ACUTE
HCV Ab: NONREACTIVE
Hep A IgM: NONREACTIVE
Hep B C IgM: NONREACTIVE
Hepatitis B Surface Ag: NONREACTIVE

## 2024-07-30 LAB — MAGNESIUM
Magnesium: 1.6 mg/dL — ABNORMAL LOW (ref 1.7–2.4)
Magnesium: 1.7 mg/dL (ref 1.7–2.4)

## 2024-07-30 LAB — OSMOLALITY, URINE: Osmolality, Ur: 478 mosm/kg (ref 300–900)

## 2024-07-30 LAB — OSMOLALITY: Osmolality: 250 mosm/kg — ABNORMAL LOW (ref 275–295)

## 2024-07-30 LAB — RESP PANEL BY RT-PCR (RSV, FLU A&B, COVID)  RVPGX2
Influenza A by PCR: POSITIVE — AB
Influenza B by PCR: NEGATIVE
Resp Syncytial Virus by PCR: NEGATIVE
SARS Coronavirus 2 by RT PCR: NEGATIVE

## 2024-07-30 LAB — LIPASE, BLOOD: Lipase: 16 U/L (ref 11–51)

## 2024-07-30 LAB — NA AND K (SODIUM & POTASSIUM), RAND UR
Potassium Urine: 55 mmol/L
Sodium, Ur: 157 mmol/L

## 2024-07-30 LAB — PHOSPHORUS: Phosphorus: 2.6 mg/dL (ref 2.5–4.6)

## 2024-07-30 LAB — I-STAT CG4 LACTIC ACID, ED: Lactic Acid, Venous: 1.2 mmol/L (ref 0.5–1.9)

## 2024-07-30 LAB — TROPONIN T, HIGH SENSITIVITY: Troponin T High Sensitivity: <15 ng/L (ref 0–19)

## 2024-07-30 LAB — PRO BRAIN NATRIURETIC PEPTIDE: Pro Brain Natriuretic Peptide: 1053 pg/mL — ABNORMAL HIGH

## 2024-07-30 LAB — CORTISOL: Cortisol, Plasma: 23.4 ug/dL

## 2024-07-30 LAB — PROCALCITONIN: Procalcitonin: 0.14 ng/mL

## 2024-07-30 LAB — GLUCOSE, CAPILLARY: Glucose-Capillary: 140 mg/dL — ABNORMAL HIGH (ref 70–99)

## 2024-07-30 MED ORDER — BUSPIRONE HCL 10 MG PO TABS
10.0000 mg | ORAL_TABLET | Freq: Two times a day (BID) | ORAL | Status: DC
  Administered 2024-07-30 – 2024-08-02 (×7): 10 mg via ORAL
  Filled 2024-07-30 (×7): qty 1

## 2024-07-30 MED ORDER — FUROSEMIDE 10 MG/ML IJ SOLN
40.0000 mg | Freq: Once | INTRAMUSCULAR | Status: AC
  Administered 2024-07-30: 40 mg via INTRAVENOUS
  Filled 2024-07-30: qty 4

## 2024-07-30 MED ORDER — ONDANSETRON HCL 4 MG PO TABS: 4.0000 mg | ORAL_TABLET | Freq: Four times a day (QID) | ORAL | Status: DC | PRN

## 2024-07-30 MED ORDER — OXYCODONE HCL 5 MG PO TABS
5.0000 mg | ORAL_TABLET | ORAL | Status: DC | PRN
  Administered 2024-07-30 – 2024-08-01 (×6): 5 mg via ORAL
  Filled 2024-07-30 (×6): qty 1

## 2024-07-30 MED ORDER — FERROUS SULFATE 325 (65 FE) MG PO TABS
325.0000 mg | ORAL_TABLET | Freq: Two times a day (BID) | ORAL | Status: DC
  Administered 2024-07-30 – 2024-08-02 (×6): 325 mg via ORAL
  Filled 2024-07-30 (×6): qty 1

## 2024-07-30 MED ORDER — IPRATROPIUM BROMIDE 0.02 % IN SOLN: 0.5000 mg | Freq: Four times a day (QID) | RESPIRATORY_TRACT | Status: DC | PRN

## 2024-07-30 MED ORDER — SODIUM CHLORIDE 0.9% FLUSH
3.0000 mL | Freq: Two times a day (BID) | INTRAVENOUS | Status: DC
  Administered 2024-07-30 – 2024-08-02 (×7): 3 mL via INTRAVENOUS

## 2024-07-30 MED ORDER — LEVALBUTEROL HCL 1.25 MG/0.5ML IN NEBU
1.2500 mg | INHALATION_SOLUTION | Freq: Four times a day (QID) | RESPIRATORY_TRACT | Status: DC
  Administered 2024-07-31 (×2): 1.25 mg via RESPIRATORY_TRACT
  Filled 2024-07-30 (×5): qty 0.5

## 2024-07-30 MED ORDER — FLEET ENEMA RE ENEM: 1.0000 | ENEMA | Freq: Once | RECTAL | Status: DC | PRN

## 2024-07-30 MED ORDER — AMLODIPINE BESYLATE 10 MG PO TABS
10.0000 mg | ORAL_TABLET | Freq: Every day | ORAL | Status: DC
  Administered 2024-07-30 – 2024-08-02 (×4): 10 mg via ORAL
  Filled 2024-07-30 (×3): qty 1
  Filled 2024-07-30: qty 2

## 2024-07-30 MED ORDER — HYDRALAZINE HCL 20 MG/ML IJ SOLN
10.0000 mg | INTRAMUSCULAR | Status: DC | PRN
  Administered 2024-07-31: 10 mg via INTRAVENOUS
  Filled 2024-07-30: qty 1

## 2024-07-30 MED ORDER — HYDROMORPHONE HCL 1 MG/ML IJ SOLN: 0.5000 mg | INTRAMUSCULAR | Status: DC | PRN

## 2024-07-30 MED ORDER — ESCITALOPRAM OXALATE 10 MG PO TABS
5.0000 mg | ORAL_TABLET | Freq: Every day | ORAL | Status: DC
  Administered 2024-07-31 – 2024-08-02 (×3): 5 mg via ORAL
  Filled 2024-07-30 (×3): qty 1

## 2024-07-30 MED ORDER — APIXABAN 5 MG PO TABS
5.0000 mg | ORAL_TABLET | Freq: Two times a day (BID) | ORAL | Status: DC
  Administered 2024-07-30 – 2024-08-02 (×7): 5 mg via ORAL
  Filled 2024-07-30 (×7): qty 1

## 2024-07-30 MED ORDER — METOPROLOL TARTRATE 50 MG PO TABS
50.0000 mg | ORAL_TABLET | Freq: Two times a day (BID) | ORAL | Status: DC
  Administered 2024-07-30 – 2024-08-02 (×6): 50 mg via ORAL
  Filled 2024-07-30 (×6): qty 1

## 2024-07-30 MED ORDER — OSELTAMIVIR PHOSPHATE 75 MG PO CAPS
75.0000 mg | ORAL_CAPSULE | Freq: Two times a day (BID) | ORAL | Status: DC
  Administered 2024-07-30 – 2024-08-02 (×7): 75 mg via ORAL
  Filled 2024-07-30 (×8): qty 1

## 2024-07-30 MED ORDER — ACETAMINOPHEN 650 MG RE SUPP: 650.0000 mg | Freq: Four times a day (QID) | RECTAL | Status: DC | PRN

## 2024-07-30 MED ORDER — CLOPIDOGREL BISULFATE 75 MG PO TABS
75.0000 mg | ORAL_TABLET | Freq: Every day | ORAL | Status: DC
  Administered 2024-07-30 – 2024-08-02 (×4): 75 mg via ORAL
  Filled 2024-07-30 (×4): qty 1

## 2024-07-30 MED ORDER — BISACODYL 5 MG PO TBEC
5.0000 mg | DELAYED_RELEASE_TABLET | Freq: Every day | ORAL | Status: DC | PRN
  Administered 2024-08-01: 5 mg via ORAL
  Filled 2024-07-30: qty 1

## 2024-07-30 MED ORDER — MORPHINE SULFATE (PF) 2 MG/ML IV SOLN
2.0000 mg | Freq: Once | INTRAVENOUS | Status: AC
  Administered 2024-07-30: 2 mg via INTRAVENOUS
  Filled 2024-07-30: qty 1

## 2024-07-30 MED ORDER — METHYLPREDNISOLONE SODIUM SUCC 40 MG IJ SOLR
40.0000 mg | Freq: Two times a day (BID) | INTRAMUSCULAR | Status: DC
  Administered 2024-07-30 – 2024-08-02 (×7): 40 mg via INTRAVENOUS
  Filled 2024-07-30 (×7): qty 1

## 2024-07-30 MED ORDER — TRAZODONE HCL 50 MG PO TABS: 25.0000 mg | ORAL_TABLET | Freq: Every evening | ORAL | Status: DC | PRN

## 2024-07-30 MED ORDER — IPRATROPIUM-ALBUTEROL 0.5-2.5 (3) MG/3ML IN SOLN
3.0000 mL | Freq: Once | RESPIRATORY_TRACT | Status: AC
  Administered 2024-07-30: 3 mL via RESPIRATORY_TRACT
  Filled 2024-07-30: qty 3

## 2024-07-30 MED ORDER — SODIUM CHLORIDE 0.9 % IV SOLN: INTRAVENOUS | Status: DC

## 2024-07-30 MED ORDER — FUROSEMIDE 10 MG/ML IJ SOLN
40.0000 mg | Freq: Two times a day (BID) | INTRAMUSCULAR | Status: DC
  Administered 2024-07-30 – 2024-07-31 (×2): 40 mg via INTRAVENOUS
  Filled 2024-07-30 (×2): qty 4

## 2024-07-30 MED ORDER — ATORVASTATIN CALCIUM 80 MG PO TABS
80.0000 mg | ORAL_TABLET | Freq: Every day | ORAL | Status: DC
  Administered 2024-07-30 – 2024-08-02 (×4): 80 mg via ORAL
  Filled 2024-07-30: qty 1
  Filled 2024-07-30: qty 2
  Filled 2024-07-30 (×2): qty 1

## 2024-07-30 MED ORDER — SENNOSIDES-DOCUSATE SODIUM 8.6-50 MG PO TABS: 1.0000 | ORAL_TABLET | Freq: Every evening | ORAL | Status: DC | PRN

## 2024-07-30 MED ORDER — ACETAMINOPHEN 325 MG PO TABS: 650.0000 mg | ORAL_TABLET | Freq: Four times a day (QID) | ORAL | Status: DC | PRN

## 2024-07-30 MED ORDER — SODIUM CHLORIDE 1 G PO TABS
1.0000 g | ORAL_TABLET | Freq: Two times a day (BID) | ORAL | Status: DC
  Administered 2024-07-30 – 2024-08-02 (×6): 1 g via ORAL
  Filled 2024-07-30 (×7): qty 1

## 2024-07-30 MED ORDER — PANTOPRAZOLE SODIUM 40 MG PO TBEC
40.0000 mg | DELAYED_RELEASE_TABLET | Freq: Every day | ORAL | Status: DC
  Administered 2024-07-30 – 2024-08-02 (×4): 40 mg via ORAL
  Filled 2024-07-30 (×4): qty 1

## 2024-07-30 MED ORDER — HEPARIN SODIUM (PORCINE) 5000 UNIT/ML IJ SOLN: 5000.0000 [IU] | Freq: Three times a day (TID) | INTRAMUSCULAR | Status: DC

## 2024-07-30 MED ORDER — ONDANSETRON HCL 4 MG/2ML IJ SOLN: 4.0000 mg | Freq: Four times a day (QID) | INTRAMUSCULAR | Status: DC | PRN

## 2024-07-30 MED ORDER — IPRATROPIUM BROMIDE 0.02 % IN SOLN
0.5000 mg | Freq: Four times a day (QID) | RESPIRATORY_TRACT | Status: DC
  Administered 2024-07-30 – 2024-07-31 (×4): 0.5 mg via RESPIRATORY_TRACT
  Filled 2024-07-30 (×4): qty 2.5

## 2024-07-30 NOTE — Assessment & Plan Note (Signed)
 Continue iron  supplements -H&H stable

## 2024-07-30 NOTE — Assessment & Plan Note (Signed)
-  Elevated proBNP, +2 lower extremity edema, with minimal bilateral pleural effusion on chest x-ray Initiating IV Lasix  40 mg twice daily - Monitor I's and O's, daily weight,  Last echo 02/23/2024 EF 60-65%, LV-normal function, mild LVH, grade 1 diastolic dysfunction, RV systolic function normal  Per patient symptoms has progressively worsened including shortness of breath, lower extremity edema since July 2025  -We will obtain repeat echocardiogram

## 2024-07-30 NOTE — Assessment & Plan Note (Signed)
 Initiating Tamiflu  -Viral pneumonia -Afebrile, with no leukocytosis obtaining procalcitonin, holding antibiotics use for now

## 2024-07-30 NOTE — Assessment & Plan Note (Signed)
 Continue statin

## 2024-07-30 NOTE — Assessment & Plan Note (Signed)
 History of COPD, close followed primary pulmonology -Not O2 dependent at baseline -POA on room air satting 87%, currently on 4 L of oxygen, satting 100%

## 2024-07-30 NOTE — Assessment & Plan Note (Signed)
 Reviewing home medication, resuming along with IV Lasix  Continue Lopressor , lisinopril , amlodipine 

## 2024-07-30 NOTE — Assessment & Plan Note (Signed)
 Continue Protonix , on Plavix  and Eliquis  high risk for bleeding

## 2024-07-30 NOTE — Assessment & Plan Note (Signed)
 History of chronic hyponatremia, with SIADH likely due to polypharmacy -Continuing sodium supplements -Recommending close deciphering of primary medications including psych meds with PCP to modify to reduce side effects

## 2024-07-30 NOTE — H&P (Signed)
 " History and Physical   Patient: Denise [name]                            PCP: Gretta Comer POUR, NP                    DOB: [birth year]            DOA: 07/30/2024 FMW:[id number]             DOS: 07/30/2024, 12:37 PM  Gretta Comer POUR, NP  Patient coming from:   HOME  I have personally reviewed patient's medical records, in electronic medical records, including:  [facility], and care everywhere.    Chief Complaint:   Chief Complaint  Patient presents with   Shortness of Breath    History of present illness:    Denise [name] is a 63 year old female, chronic anemia, HTN, HLD, anxiety, depression, aortic stenosis, insufficiency, S/P TCAR 7/25, P afib (on Eliquis ), CAD, PVD, emphysema, COPD (not O2 dependent at baseline), GERD, chronic hyponatremia (SIADH)... presenting with progressive shortness of breath, generalized weakness, and lower extremity edema. Patient was recently seen by pulmonologist, treated for pneumonia with antibiotics and steroids.   ED Evaluation:  Upon arrival was satting 86% on room air Blood pressure (!) 140/75, pulse 87, temperature 99.5 F (37.5 C), temperature source Oral, resp. rate 18, height 5' 3 (1.6 m), weight 74.8 kg, SpO2 97% 4 L of oxygen, satting 100% LABs: Sodium 120, glucose 113, creatinine 0.4, calcium  8.7, ionized calcium  1.03, magnesium  1.7, alk phos 257, AST 171 ALT 126, BNP 1053.0, troponin < 15, lactic acid 1.2, procalcitonin 0.14 Hemoglobin 16.7, UA-unremarkable Influenza A positive, influenza B, RSV, COVID  all negative.  CSX -small bilateral effusion, left lobe nodule  Requested for patient to be admitted for shortness of breath, hypoxia, ruling out progressive heart failure, due to elevated BNP, and influenza pneumonia       Patient Denies having: Fever, Chills, Cough, Chest Pain, Abd pain, N/V/D, headache, dizziness, lightheadedness,  Dysuria, Joint pain, rash, open wounds    Review of Systems: As per HPI,  otherwise 10 point review of systems were negative.   ----------------------------------------------------------------------------------------------------------------------  Allergies[1]  Home MEDs:  Prior to Admission medications  Medication Sig Start Date End Date Taking? Authorizing Provider  acetaminophen  (TYLENOL ) 500 MG tablet Take 2 tablets (1,000 mg total) by mouth every 8 (eight) hours as needed. 10/31/23   Maczis, Michael M, PA-C  albuterol  (VENTOLIN  HFA) 108 (90 Base) MCG/ACT inhaler INHALE 2 PUFFS INTO THE LUNGS EVERY 4-6 HOURS AS NEEDED FOR TIGHTNESS/WHEEZING 01/31/24   Gretta Comer POUR, NP  amLODipine  (NORVASC ) 10 MG tablet Take 1 tablet (10 mg total) by mouth daily. 04/09/24 07/21/24  West, Katlyn D, NP  amoxicillin -clavulanate (AUGMENTIN ) 875-125 MG tablet Take 1 tablet by mouth every 12 (twelve) hours. 07/16/24   Barrett, Warren SAILOR, PA-C  apixaban  (ELIQUIS ) 5 MG TABS tablet Take 1 tablet (5 mg total) by mouth 2 (two) times daily. 04/22/24   West, Katlyn D, NP  Ascorbic Acid  (VITAMIN C ) 1000 MG tablet Take 2,000 mg by mouth daily.    [provider]  atorvastatin  (LIPITOR ) 80 MG tablet TAKE 1 TABLET (80 MG TOTAL) BY MOUTH DAILY FOR CHOLESTEROL 07/09/24 07/09/25  Clark, Katherine K, NP  azithromycin  (ZITHROMAX ) 250 MG tablet Take 1 tablet (250 mg total) by mouth daily. Take first 2 tablets together, then 1 every day until finished.  07/16/24   Barrett, Jamie N, PA-C  busPIRone  (BUSPAR ) 10 MG tablet TAKE 1 TABLET (10 MG TOTAL) BY MOUTH 2 (TWO) TIMES DAILY. FOR ANXIETY 07/13/24   Clark, Katherine K, NP  clopidogrel  (PLAVIX ) 75 MG tablet Take 75 mg by mouth daily.    [provider]  escitalopram  (LEXAPRO ) 20 MG tablet TAKE 1 TABLET BY MOUTH DAILY FOR ANXIETY 06/12/24   Clark, Katherine K, NP  ferrous sulfate  325 (65 FE) MG tablet Take 1 tablet (325 mg total) by mouth 2 (two) times daily. For iron  07/21/24 07/21/25  Clark, Katherine K, NP  fluticasone -salmeterol (WIXELA  INHUB) 250-50 MCG/ACT AEPB Inhale 1 puff into the lungs in the morning and at bedtime. 04/07/24   Isadora Hose, MD  lisinopril  (ZESTRIL ) 30 MG tablet TAKE 1 TABLET (30 MG TOTAL) BY MOUTH DAILY FOR BLOOD PRESSURE. 04/27/24   Clark, Katherine K, NP  metoprolol  tartrate (LOPRESSOR ) 50 MG tablet Take 1 tablet (50 mg total) by mouth 2 (two) times daily. 03/19/24   West, Katlyn D, NP  pantoprazole  (PROTONIX ) 40 MG tablet TAKE 1 TABLET (40 MG TOTAL) BY MOUTH DAILY. FOR HEARTBURN 07/13/24   Clark, Katherine K, NP  predniSONE  (DELTASONE ) 20 MG tablet Take 2 tablets by mouth once daily in the morning for 5 days. 07/21/24   Clark, Katherine K, NP  pyridoxine  (B-6) 100 MG tablet Take 100 mg by mouth daily.    [provider]  sodium chloride  1 g tablet TAKE 1 TABLET (1 G TOTAL) BY MOUTH 2 (TWO) TIMES DAILY WITH A MEAL. TO INCREASE SODIUM 03/16/24   Clark, Katherine K, NP  Tiotropium Bromide Monohydrate  (SPIRIVA  RESPIMAT) 2.5 MCG/ACT AERS Inhale 2 puffs into the lungs daily. 04/07/24   Isadora Hose, MD  vitamin B-12 (CYANOCOBALAMIN ) 1000 MCG tablet Take 1,000 mcg by mouth daily.    [provider]  vitamin E  180 MG (400 UNITS) capsule Take 400 Units by mouth daily.    [provider]    PRN MEDs: acetaminophen  **OR** acetaminophen , bisacodyl , hydrALAZINE , HYDROmorphone  (DILAUDID ) injection, ipratropium, ondansetron  **OR** ondansetron  (ZOFRAN ) IV, oxyCODONE , senna-docusate, traZODone   Past Medical History:  Diagnosis Date   Allergy    Anemia 12/24/2019   Anxiety    Aortic insufficiency    Aortic stenosis    Arthritis    Carotid artery disease    Cataract 12/25/2019   Centrilobular emphysema (HCC) 11/2023   Chickenpox    Cholecystitis with cholelithiasis 03/31/2012   Closed fracture of L4 transverse process s/p traumatic mechanical fall 10/26/2023   COPD (chronic obstructive pulmonary disease) (HCC)    Coronary artery disease    Depression    Diastolic dysfunction    Dyspnea     with exertion   Emphysema of lung (HCC) not sure   GERD (gastroesophageal reflux disease)    Headache    Heart murmur    High anion gap metabolic acidosis 10/26/2023   History of blood transfusion 2021   HLD (hyperlipidemia)    Hypertension 03/31/2012   Hyponatremia in setting of SIADH    Left lower lobe pulmonary nodule 11/2023   Long-term use of aspirin  therapy    Lumbar compression fracture s/p traumatic mechanical fall; L4 superior endplate    Pneumonia    x 2   PVD (peripheral vascular disease)    Shingles    SIADH (syndrome of inappropriate ADH production)    Traumatic mechanical fall 10/26/2023   a.) s/p traumatic fall 10/26/2023 reuslting in multiple RIGHT rib (5th - 7th)  fractures (required dchest tube), l$ transverse process fracture, L4 superior endplate compression fracture, large volume post-traumatic myofacial/subcutaneous gas collection in chest    Past Surgical History:  Procedure Laterality Date   BIOPSY  12/24/2019   Procedure: BIOPSY;  Surgeon: Teressa Toribio SQUIBB, MD;  Location: [facility] ENDOSCOPY;  Service: Endoscopy;;   BRONCHIAL BIOPSY  12/19/2023   Procedure: BRONCHOSCOPY, WITH BIOPSY;  Surgeon: Isadora Hose, MD;  Location: [facility] ENDOSCOPY;  Service: Pulmonary;;   BRONCHIAL BRUSHINGS  12/19/2023   Procedure: BRONCHOSCOPY, WITH BRUSH BIOPSY;  Surgeon: Isadora Hose, MD;  Location: [facility] ENDOSCOPY;  Service: Pulmonary;;   BRONCHIAL NEEDLE ASPIRATION BIOPSY  12/19/2023   Procedure: BRONCHOSCOPY, WITH NEEDLE ASPIRATION BIOPSY;  Surgeon: Isadora Hose, MD;  Location: [facility] ENDOSCOPY;  Service: Pulmonary;;   BRONCHIAL WASHINGS  12/19/2023   Procedure: IRRIGATION, BRONCHUS;  Surgeon: Isadora Hose, MD;  Location: [facility] ENDOSCOPY;  Service: Pulmonary;;   BRONCHOSCOPY, WITH BIOPSY USING ELECTROMAGNETIC NAVIGATION Bilateral 12/19/2023   Procedure: ROBOTIC ASSISTED NAVIGATIONAL BRONCHOSCOPY;  Surgeon: Isadora Hose, MD;  Location: [facility] ENDOSCOPY;  Service: Pulmonary;  Laterality:  Bilateral;   CATARACT EXTRACTION Bilateral 2021   CHOLECYSTECTOMY  03/30/2012   Procedure: LAPAROSCOPIC CHOLECYSTECTOMY WITH INTRAOPERATIVE CHOLANGIOGRAM;  Surgeon: Morene ONEIDA Olives, MD;  Location: [facility] ORS;  Service: General;  Laterality: N/A;   COLONOSCOPY N/A 05/05/2024   Procedure: COLONOSCOPY;  Surgeon: San Sandor GAILS, DO;  Location: [facility] ENDOSCOPY;  Service: Gastroenterology;  Laterality: N/A;   COLONOSCOPY WITH PROPOFOL  N/A 12/24/2019   Procedure: COLONOSCOPY WITH PROPOFOL ;  Surgeon: Teressa Toribio SQUIBB, MD;  Location: [facility] ENDOSCOPY;  Service: Endoscopy;  Laterality: N/A;   ESOPHAGOGASTRODUODENOSCOPY N/A 05/05/2024   Procedure: EGD (ESOPHAGOGASTRODUODENOSCOPY);  Surgeon: [place] Sand[place] GAILS, DO;  Location: [facility] ENDOSCOPY;  Service: Gastroenterology;  Laterality: N/A;   ESOPHAGOGASTRODUODENOSCOPY (EGD) WITH PROPOFOL  N/A 12/24/2019   Procedure: ESOPHAGOGASTRODUODENOSCOPY (EGD) WITH PROPOFOL ;  Surgeon: Teressa Toribio SQUIBB, MD;  Location: [facility] ENDOSCOPY;  Service: Endoscopy;  Laterality: N/A;   EYE SURGERY  01/22/2020   HOT HEMOSTASIS N/A 12/24/2019   Procedure: HOT HEMOSTASIS (ARGON PLASMA COAGULATION/BICAP);  Surgeon: Teressa Toribio SQUIBB, MD;  Location: [facility] ENDOSCOPY;  Service: Endoscopy;  Laterality: N/A;   HOT HEMOSTASIS N/A 05/05/2024   Procedure: EGD, WITH ARGON PLASMA COAGULATION;  Surgeon: [place] Sandor GAILS, DO;  Location: [facility] ENDOSCOPY;  Service: Gastroenterology;  Laterality: N/A;   POLYPECTOMY  12/24/2019   Procedure: POLYPECTOMY;  Surgeon: Teressa Toribio SQUIBB, MD;  Location: [facility] ENDOSCOPY;  Service: Endoscopy;;   POLYPECTOMY  05/05/2024   Procedure: POLYPECTOMY, INTESTINE;  Surgeon: [place] Sandor GAILS, DO;  Location: [facility] ENDOSCOPY;  Service: Gastroenterology;;   TRANSCAROTID ARTERY REVASCULARIZATION  Left 02/12/2024   Procedure: TRANSCAROTID ARTERY REVASCULARIZATION (TCAR);  Surgeon: Gretta Lonni PARAS, MD;  Location: [facility] OR;  Service: Vascular;  Laterality: Left;   TUBAL LIGATION  1997   VIDEO  BRONCHOSCOPY WITH ENDOBRONCHIAL ULTRASOUND  12/19/2023   Procedure: BRONCHOSCOPY, WITH EBUS;  Surgeon: Isadora Hose, MD;  Location: [facility] ENDOSCOPY;  Service: Pulmonary;;   VIDEO BRONCHOSCOPY WITH RADIAL ENDOBRONCHIAL ULTRASOUND  12/19/2023   Procedure: VIDEO BRONCHOSCOPY WITH RADIAL ENDOBRONCHIAL ULTRASOUND;  Surgeon: Isadora Hose, MD;  Location: [facility] ENDOSCOPY;  Service: Pulmonary;;     reports that she quit smoking about 14 months ago. Her smoking use included cigarettes. She started smoking about 39 years ago. She has a 28.5 pack-year smoking history. She has never used smokeless tobacco. She reports current alcohol use of about 1.0 standard drink of alcohol per week. She reports that she does not use drugs.  Family History  Problem Relation Age of Onset   Arthritis Mother    Cervical cancer Mother    Cancer Mother    Heart disease Father    Hypertension Father    Heart attack Father     Physical Exam:   Vitals:   07/30/24 0807 07/30/24 0845 07/30/24 1000 07/30/24 1023  BP:  (!) 164/91 (!) 140/75   Pulse:  92 87   Resp:  (!) 21 18   Temp:    99.5 F (37.5 C)  TempSrc:    Oral  SpO2: 100% 96% 97%   Weight:      Height:       Constitutional: NAD, calm, comfortable Eyes: PERRL, lids and conjunctivae normal ENMT: Mucous membranes are moist. Posterior pharynx clear of any exudate or lesions.Normal dentition.  Neck: normal, supple, no masses, no thyromegaly Respiratory: Diffuse wheezing, rhonchi,  no crackles. Normal respiratory effort. No accessory muscle use.  Cardiovascular: Regular rate and rhythm, no murmurs / rubs / gallops. No extremity edema. 2+ pedal pulses. No carotid bruits.  Abdomen: no tenderness, no masses palpated. No hepatosplenomegaly. Bowel sounds positive.  Musculoskeletal: no clubbing / cyanosis. No joint deformity upper and lower extremities. Good ROM, no contractures. Normal muscle tone.  Neurologic: CN II-XII grossly intact. Sensation intact, DTR normal.  Strength 5/5 in all 4.  Psychiatric: Normal judgment and insight. Alert and oriented x 3. Normal mood.  Skin: no rashes, lesions, ulcers. No induration      Labs on admission:    I have personally reviewed following labs and imaging studies  CBC: Recent Labs  Lab 07/30/24 0830 07/30/24 0856  WBC 7.0  --   NEUTROABS 6.2  --   HGB 13.6 16.7*  16.7*  HCT 43.9 49.0*  49.0*  MCV 91.8  --   PLT 273  --    Basic Metabolic Panel: Recent Labs  Lab 07/30/24 0830 07/30/24 0856 07/30/24 0946 07/30/24 1055  NA 120* 120*  120*  --  121*  K 4.1 4.0  4.0  --   --   CL 83* 84*  --   --   CO2 26  --   --   --   GLUCOSE 110* 113*  --   --   BUN <5* <3*  --   --   CREATININE 0.38* 0.40*  --   --   CALCIUM  8.7*  --   --   --   MG  --   --  1.6* 1.7  PHOS  --   --   --  2.6   GFR: Estimated Creatinine Clearance: 70.7 mL/min (A) (by C-G formula based on SCr of 0.4 mg/dL (L)). Liver Function Tests: Recent Labs  Lab 07/30/24 0830  AST 171*  ALT 126*  ALKPHOS 251*  BILITOT 0.4  PROT 7.3  ALBUMIN  4.3   Recent Labs  Lab 07/30/24 0946  LIPASE 16   BNP (last 3 results) Recent Labs    07/30/24 0830  PROBNP 1,053.0*    Urine analysis:    Component Value Date/Time   COLORURINE YELLOW 07/30/2024 0826   APPEARANCEUR CLEAR 07/30/2024 0826   LABSPEC 1.011 07/30/2024 0826   PHURINE 7.0 07/30/2024 0826   GLUCOSEU NEGATIVE 07/30/2024 0826   HGBUR NEGATIVE 07/30/2024 0826   BILIRUBINUR NEGATIVE 07/30/2024 0826   BILIRUBINUR neg 12/27/2022 0955   KETONESUR NEGATIVE 07/30/2024 0826   PROTEINUR 30 (A) 07/30/2024 0826   UROBILINOGEN 0.2 12/27/2022 0955   UROBILINOGEN 0.2 03/29/2012 2003  NITRITE NEGATIVE 07/30/2024 0826   LEUKOCYTESUR NEGATIVE 07/30/2024 0826    Last A1C:  Lab Results  Component Value Date   HGBA1C 5.4 12/27/2022     Radiologic Exams on Admission:   DG Chest 2 View Result Date: 07/30/2024 CLINICAL DATA:  Shortness of breath, rhonchi. EXAM:  CHEST - 2 VIEW COMPARISON:  07/15/2024 and CT chest 07/16/2024. FINDINGS: Trachea is midline. Heart is enlarged. Thoracic aorta is calcified. 1.6 cm nodule in the superior segment left lower lobe. No airspace consolidation. Trace bilateral pleural effusions. Minimal streaky bibasilar atelectasis. Old right rib fractures.  Degenerative changes in the spine. IMPRESSION: 1. Tiny bilateral pleural effusions with minimal streaky bibasilar atelectasis. 2. Superior segment left lower lobe nodule, previously evaluated by PET 03/30/2024. Indolent adenocarcinoma cannot be excluded. Electronically Signed   By: Newell Eke M.D.   On: 07/30/2024 10:19    EKG:   Independently reviewed.  Orders placed or performed during the hospital encounter of 07/30/24   EKG 12-Lead   EKG 12-Lead   EKG 12-Lead   ---------------------------------------------------------------------------------------------------------------------------------------    Assessment / Plan:   Principal Problem:   Acute respiratory failure with hypoxia (HCC) Active Problems:   SOB (shortness of breath)   Influenza A with pneumonia   Asymptomatic carotid artery stenosis without infarction, left   History of GI bleed   COPD (chronic obstructive pulmonary disease) (HCC)   SIADH (syndrome of inappropriate ADH production)   Acute on chronic anemia   Essential hypertension   Former smoker   Lung nodule   Anxiety and depression   Hyperlipidemia   Iron  deficiency anemia due to chronic blood loss   CAD (coronary artery disease)   Gastric AVM   PAF (paroxysmal atrial fibrillation) (HCC)   (HFpEF) heart failure with preserved ejection fraction (HCC)   Assessment and Plan: * Acute respiratory failure with hypoxia (HCC) Acute hypoxic respiratory failure is multifactorial including infection influenza A pneumonia, COPD, ruling out acute heart failure - On arrival patient was satting 86% on room air, with 4Ls of oxygen, currently satting  100% -Continue DuoNeb bronchodilators, supplemental oxygen, weaning off to room air with O2 sat goal greater than 92% -Continue as needed DuoNeb bronchodilators -Initiating IV steroids, Pulmicort , nebs -Encouraging incentive spirometer, flutter valve -Treating influenza A-with p.o. Tamiflu   -Underlying COPD, with chronic tobacco abuse, -patient advised on cessation of tobacco, and follow-up close follow-up with her pulmonologist  Influenza A with pneumonia Initiating Tamiflu  -Viral pneumonia -Afebrile, with no leukocytosis obtaining procalcitonin, holding antibiotics use for now   SOB (shortness of breath) With acute hypoxic respiratory failure Multifactorial, influenza A pneumonia infection, underlying COPD, ruling out CHF Treatment as below    History of GI bleed Continue Protonix , on Plavix  and Eliquis  high risk for bleeding  Asymptomatic carotid artery stenosis without infarction, left Status post recent evaluation s/p TCAR 02/23/2024 -Patient discussed with pharmacy to confirm antiplatelet therapy and DOAC -Will continue patient's home medication of clopidogrel , Eliquis , statins - Continue Protonix   Acute on chronic anemia Anemia of chronic disease, with iron  deficiency -H&H stable, monitor closely  SIADH (syndrome of inappropriate ADH production) History of chronic hyponatremia, with SIADH likely due to polypharmacy -Continuing sodium supplements -Recommending close deciphering of primary medications including psych meds with PCP to modify to reduce side effects  COPD (chronic obstructive pulmonary disease) (HCC) History of COPD, close followed primary pulmonology -Not O2 dependent at baseline -POA on room air satting 87%, currently on 4 L of oxygen, satting 100%  (HFpEF) heart failure  with preserved ejection fraction (HCC) -Elevated proBNP, +2 lower extremity edema, with minimal bilateral pleural effusion on chest x-ray Initiating IV Lasix  40 mg twice daily -  Monitor I's and O's, daily weight,  Last echo 02/23/2024 EF 60-65%, LV-normal function, mild LVH, grade 1 diastolic dysfunction, RV systolic function normal  Per patient symptoms has progressively worsened including shortness of breath, lower extremity edema since July 2025  -We will obtain repeat echocardiogram  PAF (paroxysmal atrial fibrillation) (HCC) - Currently in normal sinus rhythm, Continue Eliquis , Lopressor ,  Gastric AVM History of gastric AVM, with GI bleed Monitoring closely, high risk for bleeding due to antiplatelet and dual anticoagulation therapy H&H stable  CAD (coronary artery disease) - Denies any chest pain, continue home medication including statins, It appears the patient is on Plavix  and Eliquis   Iron  deficiency anemia due to chronic blood loss Continue iron  supplements -H&H stable  Hyperlipidemia Continue statin  Anxiety and depression - Mildly anxious as needed Xanax  - reviewing home medication once confirmed will initiate accordingly   Lung nodule Status post recent investigation, bronchoscopy -Close follow-up with primary pulmonologist  Former smoker Per patient smoked for a long time, recently quit -Refused NicoDerm patch  Essential hypertension Reviewing home medication, resuming along with IV Lasix  Continue Lopressor , lisinopril , amlodipine        Consults called: PT/OT/TOC -------------------------------------------------------------------------------------------------------------------------------------------- DVT prophylaxis:  SCDs Start: 07/30/24 1039 apixaban  (ELIQUIS ) tablet 5 mg   Code Status:   Code Status: Full Code   Admission status: Patient will be admitted as Observation, with a greater than 2 midnight length of stay. Level of care: Telemetry   Family Communication:  none at bedside  (The above findings and plan of care has been discussed with patient in detail, the patient expressed understanding and agreement  of above plan)  --------------------------------------------------------------------------------------------------------------------------------------------------  Disposition Plan: >3 days Status is: Observation The patient remains OBS appropriate and will d/c before 2 midnights.     ----------------------------------------------------------------------------------------------------------------------------------------------------  Time spent:  85  Min.  Was spent seeing and evaluating the patient, reviewing all medical records, drawn plan of care.  SIGNED: Adriana DELENA Grams, MD, FHM. FAAFP. [place] [facility], Pager  (Please use [email or web address] to page/ or secure chat through epic) If 7PM-7AM, please contact night-coverage [email or web address],  07/30/2024, 12:37 PM     [1] No Known Allergies  "

## 2024-07-30 NOTE — ED Notes (Addendum)
 PT SHOB when ambulating, pt ambulated to the bathroom approx 11ft from her room without O2 and saturations decreased

## 2024-07-30 NOTE — Assessment & Plan Note (Signed)
-   Mildly anxious as needed Xanax  - reviewing home medication once confirmed will initiate accordingly

## 2024-07-30 NOTE — Assessment & Plan Note (Addendum)
 Status post recent evaluation s/p TCAR 02/23/2024 -Patient discussed with pharmacy to confirm antiplatelet therapy and DOAC -Will continue patient's home medication of clopidogrel , Eliquis , statins - Continue Protonix 

## 2024-07-30 NOTE — Assessment & Plan Note (Signed)
-   Denies any chest pain, continue home medication including statins, It appears the patient is on Plavix  and Eliquis

## 2024-07-30 NOTE — Assessment & Plan Note (Signed)
 History of gastric AVM, with GI bleed Monitoring closely, high risk for bleeding due to antiplatelet and dual anticoagulation therapy H&H stable

## 2024-07-30 NOTE — ED Triage Notes (Signed)
 Pt to er, pt states that she had pneumonia back around the 10, states that she was getting better on the abx, but since Monday she has started being more short of breath, states that she has also been having some nose bleeds.  Reports cough, denies fever.  Pt talking in short sentences.  States that last night her lungs felt very tight.

## 2024-07-30 NOTE — ED Notes (Signed)
 Patient was placed on a [place] at 2lpm due to patients O2 sat at 87% on arrival

## 2024-07-30 NOTE — ED Triage Notes (Signed)
 Pt satting 86-87 on room air, pt placed on 4L O2 via [place] pt 100% on 4L

## 2024-07-30 NOTE — Assessment & Plan Note (Signed)
-   Currently in normal sinus rhythm, Continue Eliquis , Lopressor ,

## 2024-07-30 NOTE — Assessment & Plan Note (Signed)
 Per patient smoked for a long time, recently quit -Refused NicoDerm patch

## 2024-07-30 NOTE — TOC CM/SW Note (Signed)
 TOC consult received for d/c planning needs. Follow-up to be completed with patient as appropriate.   Merilee Batty, MSN, RN Case Management [phone]

## 2024-07-30 NOTE — Assessment & Plan Note (Addendum)
 With acute hypoxic respiratory failure Multifactorial, influenza A pneumonia infection, underlying COPD, ruling out CHF Treatment as below

## 2024-07-30 NOTE — Assessment & Plan Note (Signed)
 Acute hypoxic respiratory failure is multifactorial including infection influenza A pneumonia, COPD, ruling out acute heart failure - On arrival patient was satting 86% on room air, with 4Ls of oxygen, currently satting 100% -Continue DuoNeb bronchodilators, supplemental oxygen, weaning off to room air with O2 sat goal greater than 92% -Continue as needed DuoNeb bronchodilators -Initiating IV steroids, Pulmicort , nebs -Encouraging incentive spirometer, flutter valve -Treating influenza A-with p.o. Tamiflu   -Underlying COPD, with chronic tobacco abuse, -patient advised on cessation of tobacco, and follow-up close follow-up with her pulmonologist

## 2024-07-30 NOTE — Progress Notes (Signed)
" °   07/30/24 1525  Vitals  Temp 98.3 F (36.8 C)  Temp Source Oral  BP (!) 160/70  MAP (mmHg) 93  Pulse Rate 94  ECG Heart Rate 92  Resp (!) 21  MEWS COLOR  MEWS Score Color Green  Oxygen Therapy  SpO2 95 %  O2 Device Nasal Cannula  O2 Flow Rate (L/min) 2 L/min  MEWS Score  MEWS Temp 0  MEWS Systolic 0  MEWS Pulse 0  MEWS RR 1  MEWS LOC 0  MEWS Score 1   New admit from ED on 2L oxygen "

## 2024-07-30 NOTE — Assessment & Plan Note (Signed)
 Status post recent investigation, bronchoscopy -Close follow-up with primary pulmonologist

## 2024-07-30 NOTE — Assessment & Plan Note (Signed)
 Anemia of chronic disease, with iron  deficiency -H&H stable, monitor closely

## 2024-07-30 NOTE — Hospital Course (Addendum)
 Denise [name] is a 64 year old female, chronic anemia, HTN, HLD, anxiety, depression, aortic stenosis, insufficiency, S/P TCAR 7/25, P afib (on Eliquis ), CAD, PVD, emphysema, COPD (not O2 dependent at baseline), GERD, chronic hyponatremia (SIADH)... presenting with progressive shortness of breath, generalized weakness, and lower extremity edema. Patient was recently seen by pulmonologist, treated for pneumonia with antibiotics and steroids.   ED Evaluation:  Upon arrival was satting 86% on room air Blood pressure (!) 140/75, pulse 87, temperature 99.5 F (37.5 C), temperature source Oral, resp. rate 18, height 5' 3 (1.6 m), weight 74.8 kg, SpO2 97% 4 L of oxygen, satting 100% LABs: Sodium 120, glucose 113, creatinine 0.4, calcium  8.7, ionized calcium  1.03, magnesium  1.7, alk phos 257, AST 171 ALT 126, BNP 1053.0, troponin < 15, lactic acid 1.2, procalcitonin 0.14 Hemoglobin 16.7, UA-unremarkable Influenza A positive, influenza B, RSV, COVID  all negative.  CSX -small bilateral effusion, left lobe nodule  Requested for patient to be admitted for shortness of breath, hypoxia, ruling out progressive heart failure, due to elevated BNP, and influenza pneumonia

## 2024-07-30 NOTE — ED Notes (Signed)
 Nt called CCMD@10 :42am

## 2024-07-30 NOTE — ED Provider Notes (Signed)
 " [place] [facility]GENCY DEPARTMENT AT [facility] Provider Note   CSN: [id number] Arrival date & time: 07/30/24  9251     Denise [name] presents with: Shortness of Breath   Denise Denise [name] is a 63 y.o. female.   HPI Denise [name] is a 63 year old female presenting ED today for concerns for worsening shortness of breath worse with exertion and laying down, cough, bilateral lower chest pain that has been ongoing x 3 days and progressively worse.  Notably has been treated for pneumonia with Augmentin  and azithromycin  since 07/15/2024, finished course, having initially felt better, seen by PCP on 07/21/2024 and given prednisone .  Noting symptoms progressively became worse after prednisone  course ended.  Notably does have sick contact with daughter at bedside who reported URI Sx.   Previous medical history of CAD, COPD, cardiomegaly, SIADH, atrial flutter with RVR, aortic insufficiency, PVD, emphysema.  Denies headache, vision changes, hemoptysis, abdominal pain, nausea, vomiting, diarrhea, melena, hematochezia, dysuria, hematuria, rashes.     Prior to Admission medications  Medication Sig Start Date End Date Taking? Authorizing Provider  acetaminophen  (TYLENOL ) 500 MG tablet Take 2 tablets (1,000 mg total) by mouth every 8 (eight) hours as needed. 10/31/23   Maczis, Michael M, PA-C  albuterol  (VENTOLIN  HFA) 108 (90 Base) MCG/ACT inhaler INHALE 2 PUFFS INTO Denise LUNGS EVERY 4-6 HOURS AS NEEDED FOR TIGHTNESS/WHEEZING 01/31/24   Gretta Comer POUR, NP  amLODipine  (NORVASC ) 10 MG tablet Take 1 tablet (10 mg total) by mouth daily. 04/09/24 07/21/24  West, Katlyn D, NP  amoxicillin -clavulanate (AUGMENTIN ) 875-125 MG tablet Take 1 tablet by mouth every 12 (twelve) hours. 07/16/24   Barrett, Warren SAILOR, PA-C  apixaban  (ELIQUIS ) 5 MG TABS tablet Take 1 tablet (5 mg total) by mouth 2 (two) times daily. 04/22/24   West, Katlyn D, NP  Ascorbic Acid  (VITAMIN C ) 1000 MG tablet Take 2,000 mg by mouth daily.     [provider]  atorvastatin  (LIPITOR ) 80 MG tablet TAKE 1 TABLET (80 MG TOTAL) BY MOUTH DAILY FOR CHOLESTEROL 07/09/24 07/09/25  Gretta Comer POUR, NP  azithromycin  (ZITHROMAX ) 250 MG tablet Take 1 tablet (250 mg total) by mouth daily. Take first 2 tablets together, then 1 every day until finished. 07/16/24   Barrett, Jamie N, PA-C  busPIRone  (BUSPAR ) 10 MG tablet TAKE 1 TABLET (10 MG TOTAL) BY MOUTH 2 (TWO) TIMES DAILY. FOR ANXIETY 07/13/24   Clark, Katherine K, NP  clopidogrel  (PLAVIX ) 75 MG tablet Take 75 mg by mouth daily.    [provider]  escitalopram  (LEXAPRO ) 20 MG tablet TAKE 1 TABLET BY MOUTH DAILY FOR ANXIETY 06/12/24   Gretta Comer POUR, NP  ferrous sulfate  325 (65 FE) MG tablet Take 1 tablet (325 mg total) by mouth 2 (two) times daily. For iron  07/21/24 07/21/25  Clark, Katherine K, NP  fluticasone -salmeterol (WIXELA INHUB) 250-50 MCG/ACT AEPB Inhale 1 puff into Denise lungs in Denise morning and at bedtime. 04/07/24   Isadora Hose, MD  lisinopril  (ZESTRIL ) 30 MG tablet TAKE 1 TABLET (30 MG TOTAL) BY MOUTH DAILY FOR BLOOD PRESSURE. 04/27/24   Clark, Katherine K, NP  metoprolol  tartrate (LOPRESSOR ) 50 MG tablet Take 1 tablet (50 mg total) by mouth 2 (two) times daily. 03/19/24   West, Katlyn D, NP  pantoprazole  (PROTONIX ) 40 MG tablet TAKE 1 TABLET (40 MG TOTAL) BY MOUTH DAILY. FOR HEARTBURN 07/13/24   Clark, Katherine K, NP  predniSONE  (DELTASONE ) 20 MG tablet Take 2 tablets by mouth once daily in Denise morning for  5 days. 07/21/24   Gretta Comer POUR, NP  pyridoxine  (B-6) 100 MG tablet Take 100 mg by mouth daily.    [provider]  sodium chloride  1 g tablet TAKE 1 TABLET (1 G TOTAL) BY MOUTH 2 (TWO) TIMES DAILY WITH A MEAL. TO INCREASE SODIUM 03/16/24   Clark, Katherine K, NP  Tiotropium Bromide Monohydrate  (SPIRIVA  RESPIMAT) 2.5 MCG/ACT AERS Inhale 2 puffs into Denise lungs daily. 04/07/24   Isadora Hose, MD  vitamin B-12 (CYANOCOBALAMIN ) 1000 MCG tablet Take 1,000 mcg  by mouth daily.    [provider]  vitamin E  180 MG (400 UNITS) capsule Take 400 Units by mouth daily.    [provider]    Allergies: Denise [name] has no known allergies.    Review of Systems  HENT:  Positive for congestion.   Respiratory:  Positive for cough.   All other systems reviewed and are negative.   Updated Vital Signs BP (!) 140/75 (BP Location: Left Arm)   Pulse 87   Temp 99.5 F (37.5 C) (Oral)   Resp 18   Ht 5' 3 (1.6 m)   Wt 74.8 kg   SpO2 97%   BMI 29.23 kg/m   Physical Exam Vitals and nursing note reviewed.  Constitutional:      General: She is not in acute distress.    Appearance: She is ill-appearing. She is not diaphoretic.  HENT:     Head: Normocephalic and atraumatic.  Eyes:     General: No scleral icterus.       Right eye: No discharge.        Left eye: No discharge.     Extraocular Movements: Extraocular movements intact.     Conjunctiva/sclera: Conjunctivae normal.     Pupils: Pupils are equal, round, and reactive to light.  Cardiovascular:     Rate and Rhythm: Normal rate and regular rhythm.     Pulses: Normal pulses.     Heart sounds: Normal heart sounds. No murmur heard.    No friction rub. No gallop.  Pulmonary:     Effort: Tachypnea present. No accessory muscle usage or respiratory distress.     Breath sounds: No stridor. Rhonchi present. No wheezing or rales.  Chest:     Chest wall: No tenderness.  Abdominal:     General: Abdomen is flat. There is no distension.     Palpations: Abdomen is soft.     Tenderness: There is no abdominal tenderness. There is no right CVA tenderness, left CVA tenderness, guarding or rebound.  Musculoskeletal:        General: No swelling, deformity or signs of injury.     Cervical back: Normal range of motion. No rigidity.     Right lower leg: Edema present.     Left lower leg: Edema present.  Skin:    General: Skin is warm and dry.     Findings: No bruising, erythema or lesion.   Neurological:     General: No focal deficit present.     Mental Status: She is alert and oriented to person, place, and time. Mental status is at baseline.     Sensory: No sensory deficit.     Motor: No weakness.  Psychiatric:        Mood and Affect: Mood normal.     (all labs ordered are listed, but only abnormal results are displayed) Labs Reviewed  RESP PANEL BY RT-PCR (RSV, FLU A&B, COVID)  RVPGX2 - Abnormal; Notable for Denise following components:  Result Value   Influenza A by PCR POSITIVE (*)    All other components within normal limits  CBC WITH DIFFERENTIAL/PLATELET - Abnormal; Notable for Denise following components:   RDW 18.6 (*)    Lymphs Abs 0.2 (*)    All other components within normal limits  COMPREHENSIVE METABOLIC PANEL WITH GFR - Abnormal; Notable for Denise following components:   Sodium 120 (*)    Chloride 83 (*)    Glucose, Bld 110 (*)    BUN <5 (*)    Creatinine, Ser 0.38 (*)    Calcium  8.7 (*)    AST 171 (*)    ALT 126 (*)    Alkaline Phosphatase 251 (*)    All other components within normal limits  PRO BRAIN NATRIURETIC PEPTIDE - Abnormal; Notable for Denise following components:   Pro Brain Natriuretic Peptide 1,053.0 (*)    All other components within normal limits  URINALYSIS, ROUTINE W REFLEX MICROSCOPIC - Abnormal; Notable for Denise following components:   Protein, ur 30 (*)    All other components within normal limits  MAGNESIUM  - Abnormal; Notable for Denise following components:   Magnesium  1.6 (*)    All other components within normal limits  I-STAT VENOUS BLOOD GAS, ED - Abnormal; Notable for Denise following components:   pO2, Ven 157 (*)    Sodium 120 (*)    Calcium , Ion 1.07 (*)    HCT 49.0 (*)    Hemoglobin 16.7 (*)    All other components within normal limits  I-STAT CHEM 8, ED - Abnormal; Notable for Denise following components:   Sodium 120 (*)    Chloride 84 (*)    BUN <3 (*)    Creatinine, Ser 0.40 (*)    Glucose, Bld 113 (*)     Calcium , Ion 1.03 (*)    Hemoglobin 16.7 (*)    HCT 49.0 (*)    All other components within normal limits  EXPECTORATED SPUTUM ASSESSMENT W GRAM STAIN, RFLX TO RESP C  LIPASE, BLOOD  MAGNESIUM   PHOSPHORUS  OSMOLALITY  NA AND K (SODIUM & POTASSIUM), RAND UR  OSMOLALITY, URINE  CORTISOL  SODIUM  PROCALCITONIN  I-STAT CG4 LACTIC ACID, ED  TROPONIN T, HIGH SENSITIVITY    EKG: EKG Interpretation Date/Time:  Thursday July 30 2024 08:06:30 EST Ventricular Rate:  95 PR Interval:    QRS Duration:  91 QT Interval:  361 QTC Calculation: 454 R Axis:   62  Text Interpretation: Normal sinus rhythm Artifact Confirmed by Ruthe Cornet [phone]) on 07/30/2024 8:09:13 AM  Radiology: [facility] Chest 2 View Result Date: 07/30/2024 CLINICAL DATA:  Shortness of breath, rhonchi. EXAM: CHEST - 2 VIEW COMPARISON:  07/15/2024 and CT chest 07/16/2024. FINDINGS: Trachea is midline. Heart is enlarged. Thoracic aorta is calcified. 1.6 cm nodule in Denise superior segment left lower lobe. No airspace consolidation. Trace bilateral pleural effusions. Minimal streaky bibasilar atelectasis. Old right rib fractures.  Degenerative changes in Denise spine. IMPRESSION: 1. Tiny bilateral pleural effusions with minimal streaky bibasilar atelectasis. 2. Superior segment left lower lobe nodule, previously evaluated by PET 03/30/2024. Indolent adenocarcinoma cannot be excluded. Electronically Signed   By: Newell Eke M.D.   On: 07/30/2024 10:19    Procedures   Medications Ordered in [facility] ED  oseltamivir  (TAMIFLU ) capsule 75 mg (has no administration in time range)  furosemide  (LASIX ) injection 40 mg (has no administration in time range)  heparin  injection 5,000 Units (has no administration in time range)  sodium chloride  flush (  NS) 0.9 % injection 3 mL (has no administration in time range)  0.9 %  sodium chloride  infusion (has no administration in time range)  acetaminophen  (TYLENOL ) tablet 650 mg (has no administration  in time range)    Or  acetaminophen  (TYLENOL ) suppository 650 mg (has no administration in time range)  oxyCODONE  (Oxy IR/ROXICODONE ) immediate release tablet 5 mg (has no administration in time range)  HYDROmorphone  (DILAUDID ) injection 0.5-1 mg (has no administration in time range)  traZODone  (DESYREL ) tablet 25 mg (has no administration in time range)  senna-docusate (Senokot-S) tablet 1 tablet (has no administration in time range)  bisacodyl  (DULCOLAX) EC tablet 5 mg (has no administration in time range)  ondansetron  (ZOFRAN ) tablet 4 mg (has no administration in time range)    Or  ondansetron  (ZOFRAN ) injection 4 mg (has no administration in time range)  ipratropium (ATROVENT ) nebulizer solution 0.5 mg (has no administration in time range)  hydrALAZINE  (APRESOLINE ) injection 10 mg (has no administration in time range)  ipratropium-albuterol  (DUONEB) 0.5-2.5 (3) MG/3ML nebulizer solution 3 mL (3 mLs Nebulization Given 07/30/24 0843)  furosemide  (LASIX ) injection 40 mg (40 mg Intravenous Given 07/30/24 1019)  morphine  (PF) 2 MG/ML injection 2 mg (2 mg Intravenous Given 07/30/24 1018)     Medical Decision Making  This Denise [name] is a 63 year old female who presents to [facility] ED for concern of shortness of breath worse with exertion and laying down, lower leg edema, cough, congestion, and new oxygen requirement currently on 4 L nasal cannula that was placed upon arrival with oxygen saturations 88% on room air.  Treated originally for pneumonia with Augmentin  and azithromycin , finished course, followed up with PCP and finished course of steroids.  Noted to have had symptoms mildly improved but worsening after steroid completion.  Does have sick contact with daughter at bedside who has URI.  On physical exam, Denise [name] is in afebrile, alert and orient x 4, nontachycardic.  Noted with tachypneic and speaking in short sentences.  Notably does have rhonchi bilaterally as well as bilateral lower leg edema.   No chest wall tenderness on palpation.  No abdominal tenderness, no CVA tenderness.  Oropharynx is clear without any signs of infection.  With current presentation, suspicious for acute decompensated CHF versus pneumonia versus COPD exacerbation.  Provided DuoNebs for rhonchi.  Rhonchi improved on reevaluation.  Shortness of breath also improved.  However notably did have some mild pleural effusions, with an elevated BNP suspicious for acute decompensated heart failure.  Additionally having hyponatremia suspicion to be secondary to volume overload.  Provided Lasix .  And will seek to admit.  Denise [name] care transferred over to Dr. Willette.   Differential diagnoses prior to evaluation: Denise emergent differential diagnosis includes, but is not limited to, COPD exacerbation, pneumonia, pericarditis, ACS, CHF, PE, AAS, pneumothorax, sepsis. This is not an exhaustive differential.   Past Medical History / Co-morbidities / Social History: CAD, COPD, cardiomegaly, SIADH, atrial flutter with RVR, aortic insufficiency, PVD, emphysema.  Additional history: Chart reviewed. Pertinent results include:   Notably was last seen in Denise emergency department on 07/15/2024 diagnosed with right lower lobe pneumonia and sent home on Augmentin  and azithromycin .  Seen in follow-up on 07/21/2024, known to have had persistent mid lower thoracic back pain.  Additionally noting persistent chest tightness and cough.  Prescribed prednisone .  Was to follow-up in 3 weeks.  Noted to have last echo on 02/22/2024 with a EF of 60 to 65% with normal LV function, normal RV function  Lab Tests/Imaging  studies: I personally interpreted labs/imaging and Denise pertinent results include:   CBC unremarkable CMP notes a hyponatremia as well as a transaminitis.  Hyponatremia likely secondary to fluid overload VBG with normal pH. BNP noted to be 1083 indicating likely heart failure Respiratory panel positive for influenza A   CXR   Chest x-ray shows small bilateral pleural effusions with superior left lower lung nodule noted  Lactic was normal.  Magnesium  noted to be low at 1.6.  I agree with Denise radiologist interpretation.  Cardiac monitoring: EKG obtained and interpreted by myself and attending physician which shows: NSR  EKG Interpretation Date/Time:  Thursday July 30 2024 08:06:30 EST Ventricular Rate:  95 PR Interval:    QRS Duration:  91 QT Interval:  361 QTC Calculation: 454 R Axis:   62  Text Interpretation: Normal sinus rhythm Artifact Confirmed by Ruthe Cornet [phone]) on 07/30/2024 8:09:13 AM          Medications: I ordered medication including Lasix , morphine , DuoNebs.  I have reviewed Denise patients home medicines and have made adjustments as needed.  Critical Interventions: None  Social Determinants of Health: None  Disposition: After consideration of Denise diagnostic results and Denise patients response to treatment, I feel that Denise Denise [name] would benefit from admission with Denise [name] care transferred over to Dr. Willette.   Final diagnoses:  Hyponatremia  Hypoxia  Acute congestive heart failure, unspecified heart failure type [facility])  Transaminitis  Influenza A    ED Discharge Orders     None          Beola Terrall RAMAN, PA-C 07/30/24 1053    Ruthe Cornet, DO 07/30/24 1154  "

## 2024-07-31 DIAGNOSIS — J432 Centrilobular emphysema: Secondary | ICD-10-CM | POA: Diagnosis present

## 2024-07-31 DIAGNOSIS — J1001 Influenza due to other identified influenza virus with the same other identified influenza virus pneumonia: Secondary | ICD-10-CM | POA: Diagnosis present

## 2024-07-31 DIAGNOSIS — R7401 Elevation of levels of liver transaminase levels: Secondary | ICD-10-CM | POA: Diagnosis present

## 2024-07-31 DIAGNOSIS — I480 Paroxysmal atrial fibrillation: Secondary | ICD-10-CM | POA: Diagnosis present

## 2024-07-31 DIAGNOSIS — J9601 Acute respiratory failure with hypoxia: Secondary | ICD-10-CM

## 2024-07-31 DIAGNOSIS — I352 Nonrheumatic aortic (valve) stenosis with insufficiency: Secondary | ICD-10-CM | POA: Diagnosis present

## 2024-07-31 DIAGNOSIS — Z7902 Long term (current) use of antithrombotics/antiplatelets: Secondary | ICD-10-CM | POA: Diagnosis not present

## 2024-07-31 DIAGNOSIS — F419 Anxiety disorder, unspecified: Secondary | ICD-10-CM | POA: Diagnosis present

## 2024-07-31 DIAGNOSIS — Z1152 Encounter for screening for COVID-19: Secondary | ICD-10-CM | POA: Diagnosis not present

## 2024-07-31 DIAGNOSIS — F32A Depression, unspecified: Secondary | ICD-10-CM | POA: Diagnosis present

## 2024-07-31 DIAGNOSIS — Z7901 Long term (current) use of anticoagulants: Secondary | ICD-10-CM | POA: Diagnosis not present

## 2024-07-31 DIAGNOSIS — K31819 Angiodysplasia of stomach and duodenum without bleeding: Secondary | ICD-10-CM | POA: Diagnosis present

## 2024-07-31 DIAGNOSIS — I5033 Acute on chronic diastolic (congestive) heart failure: Secondary | ICD-10-CM | POA: Diagnosis present

## 2024-07-31 DIAGNOSIS — R0602 Shortness of breath: Secondary | ICD-10-CM | POA: Diagnosis present

## 2024-07-31 DIAGNOSIS — J441 Chronic obstructive pulmonary disease with (acute) exacerbation: Secondary | ICD-10-CM | POA: Diagnosis present

## 2024-07-31 DIAGNOSIS — E222 Syndrome of inappropriate secretion of antidiuretic hormone: Secondary | ICD-10-CM | POA: Diagnosis present

## 2024-07-31 DIAGNOSIS — I2510 Atherosclerotic heart disease of native coronary artery without angina pectoris: Secondary | ICD-10-CM | POA: Diagnosis present

## 2024-07-31 DIAGNOSIS — K219 Gastro-esophageal reflux disease without esophagitis: Secondary | ICD-10-CM | POA: Diagnosis present

## 2024-07-31 DIAGNOSIS — J440 Chronic obstructive pulmonary disease with acute lower respiratory infection: Secondary | ICD-10-CM | POA: Diagnosis present

## 2024-07-31 DIAGNOSIS — I6522 Occlusion and stenosis of left carotid artery: Secondary | ICD-10-CM | POA: Diagnosis present

## 2024-07-31 DIAGNOSIS — D500 Iron deficiency anemia secondary to blood loss (chronic): Secondary | ICD-10-CM | POA: Diagnosis present

## 2024-07-31 DIAGNOSIS — R911 Solitary pulmonary nodule: Secondary | ICD-10-CM | POA: Diagnosis present

## 2024-07-31 DIAGNOSIS — Z7982 Long term (current) use of aspirin: Secondary | ICD-10-CM | POA: Diagnosis not present

## 2024-07-31 DIAGNOSIS — I110 Hypertensive heart disease with heart failure: Secondary | ICD-10-CM | POA: Diagnosis present

## 2024-07-31 DIAGNOSIS — E785 Hyperlipidemia, unspecified: Secondary | ICD-10-CM | POA: Diagnosis present

## 2024-07-31 LAB — BASIC METABOLIC PANEL WITH GFR
Anion gap: 11 (ref 5–15)
BUN: 15 mg/dL (ref 8–23)
CO2: 26 mmol/L (ref 22–32)
Calcium: 9.3 mg/dL (ref 8.9–10.3)
Chloride: 88 mmol/L — ABNORMAL LOW (ref 98–111)
Creatinine, Ser: 0.67 mg/dL (ref 0.44–1.00)
GFR, Estimated: >60 mL/min
Glucose, Bld: 126 mg/dL — ABNORMAL HIGH (ref 70–99)
Potassium: 4.1 mmol/L (ref 3.5–5.1)
Sodium: 125 mmol/L — ABNORMAL LOW (ref 135–145)

## 2024-07-31 LAB — APTT: aPTT: 36 s (ref 24–36)

## 2024-07-31 LAB — CORTISOL: Cortisol, Plasma: 6.4 ug/dL

## 2024-07-31 MED ORDER — FLUTICASONE FUROATE-VILANTEROL 200-25 MCG/ACT IN AEPB: 1.0000 | INHALATION_SPRAY | Freq: Every day | RESPIRATORY_TRACT | Status: DC

## 2024-07-31 MED ORDER — FLUTICASONE FUROATE-VILANTEROL 200-25 MCG/ACT IN AEPB
1.0000 | INHALATION_SPRAY | Freq: Every day | RESPIRATORY_TRACT | Status: AC
  Administered 2024-07-31 – 2024-08-02 (×3): 1 via RESPIRATORY_TRACT

## 2024-07-31 MED ORDER — FLUTICASONE FUROATE-VILANTEROL 200-25 MCG/ACT IN AEPB
1.0000 | INHALATION_SPRAY | Freq: Every day | RESPIRATORY_TRACT | Status: DC
  Filled 2024-07-31: qty 28

## 2024-07-31 MED ORDER — LEVALBUTEROL HCL 1.25 MG/0.5ML IN NEBU
1.2500 mg | INHALATION_SOLUTION | Freq: Two times a day (BID) | RESPIRATORY_TRACT | Status: DC
  Administered 2024-07-31 – 2024-08-01 (×2): 1.25 mg via RESPIRATORY_TRACT
  Filled 2024-07-31 (×2): qty 0.5

## 2024-07-31 MED ORDER — FUROSEMIDE 40 MG PO TABS
40.0000 mg | ORAL_TABLET | Freq: Every day | ORAL | Status: DC
  Administered 2024-07-31 – 2024-08-02 (×3): 40 mg via ORAL
  Filled 2024-07-31 (×3): qty 1

## 2024-07-31 MED ORDER — IPRATROPIUM BROMIDE 0.02 % IN SOLN
0.5000 mg | Freq: Two times a day (BID) | RESPIRATORY_TRACT | Status: DC
  Administered 2024-07-31 – 2024-08-01 (×2): 0.5 mg via RESPIRATORY_TRACT
  Filled 2024-07-31 (×2): qty 2.5

## 2024-07-31 NOTE — Evaluation (Signed)
 Physical Therapy Evaluation Patient Details Name: Denise [name] MRN: [id number] DOB: 1961-03-26 Today's Date: 07/31/2024  History of Present Illness  Pt is a 63 y.o female admitted 12/25 for SOB. Chest x-ray showed small bil pleaural effusion.  PMH: CAD, COPD, atrial flutter with RVR, PVD, HTN, HLD, pneumonia, seizures, GI bleed, shingles  Clinical Impression  Prior to admittance, pt was independent with mobility and ADLs. Pt presents to evaluation with deficits in mobility, strength, activity tolerance, pain, and balance. Pt was able to ambulate short room level distances w/out an AD and no physical assistance. Pt would benefit from further gait training and LE strengthening. PT will continue to treat pt while she is admitted. No follow up therapies recommended at this time.         If plan is discharge home, recommend the following: Assistance with cooking/housework;Assist for transportation;Help with stairs or ramp for entrance   Can travel by private vehicle        Equipment Recommendations None recommended by PT  Recommendations for Other Services       Functional Status Assessment Patient has had a recent decline in their functional status and demonstrates the ability to make significant improvements in function in a reasonable and predictable amount of time.     Precautions / Restrictions Precautions Precautions: Fall Recall of Precautions/Restrictions: Intact Restrictions Weight Bearing Restrictions Per Provider Order: No      Mobility  Bed Mobility Overal bed mobility: Modified Independent             General bed mobility comments: increased time to complete    Transfers Overall transfer level: Modified independent Equipment used: None               General transfer comment: pt pushes off bed with BUE. increased time to copmlete.    Ambulation/Gait Ambulation/Gait assistance: Supervision Gait Distance (Feet): 25 Feet Assistive device:  None Gait Pattern/deviations: Step-through pattern, Decreased stride length Gait velocity: reduced Gait velocity interpretation: <1.8 ft/sec, indicate of risk for recurrent falls   General Gait Details: Pt demonstrates reciprocal gait pattern with reduced stride length and reduced velocity.  Stairs            Wheelchair Mobility     Tilt Bed    Modified Rankin (Stroke Patients Only)       Balance Overall balance assessment: Needs assistance Sitting-balance support: No upper extremity supported, Feet supported Sitting balance-Leahy Scale: Good Sitting balance - Comments: EOB for MMT   Standing balance support: No upper extremity supported, During functional activity Standing balance-Leahy Scale: Fair Standing balance comment: able to stand w/out UE support and shift weight throughout gait with no LOBs                             Pertinent Vitals/Pain Pain Assessment Pain Assessment: 0-10 Pain Score: 9  Pain Location: back Pain Descriptors / Indicators: Constant, Discomfort, Grimacing, Guarding Pain Intervention(s): Limited activity within patient's tolerance, Monitored during session, Patient requesting pain meds-RN notified    Home Living Family/patient expects to be discharged to:: Private residence Living Arrangements: Children Available Help at Discharge: Family;Available PRN/intermittently (daughter works) Type of Home: House Home Access: Stairs to enter Entrance Stairs-Rails: Doctor, General Practice of Steps: 4   Home Layout: One level Home Equipment: Teacher, English As A Foreign Language (2 wheels)      Prior Function Prior Level of Function : Independent/Modified Independent  Mobility Comments: no AD ADLs Comments: indep     Extremity/Trunk Assessment   Upper Extremity Assessment Upper Extremity Assessment: Defer to OT evaluation    Lower Extremity Assessment Lower Extremity Assessment: Generalized weakness     Cervical / Trunk Assessment Cervical / Trunk Assessment: Kyphotic  Communication   Communication Communication: No apparent difficulties    Cognition Arousal: Alert Behavior During Therapy: WFL for tasks assessed/performed   PT - Cognitive impairments: No apparent impairments                         Following commands: Intact       Cueing Cueing Techniques: Verbal cues, Gestural cues     General Comments General comments (skin integrity, edema, etc.): BP seated: 111/64, HR: 87 bpm, SpO2 94%-91% on 2.5L    Exercises     Assessment/Plan    PT Assessment Patient needs continued PT services  PT Problem List Decreased strength;Decreased activity tolerance;Decreased balance;Decreased mobility;Decreased knowledge of use of DME;Cardiopulmonary status limiting activity;Pain       PT Treatment Interventions DME instruction;Gait training;Stair training;Functional mobility training;Therapeutic activities;Therapeutic exercise;Balance training;Patient/family education;Wheelchair mobility training;Manual techniques    PT Goals (Current goals can be found in the Care Plan section)  Acute Rehab PT Goals Patient Stated Goal: to go home PT Goal Formulation: With patient Time For Goal Achievement: 08/14/24 Potential to Achieve Goals: Good    Frequency Min 1X/week     Co-evaluation               AM-PAC PT 6 Clicks Mobility  Outcome Measure Help needed turning from your back to your side while in a flat bed without using bedrails?: None Help needed moving from lying on your back to sitting on the side of a flat bed without using bedrails?: None Help needed moving to and from a bed to a chair (including a wheelchair)?: A Little Help needed standing up from a chair using your arms (e.g., wheelchair or bedside chair)?: A Little Help needed to walk in hospital room?: A Little Help needed climbing 3-5 steps with a railing? : Total 6 Click Score: 18    End of  Session Equipment Utilized During Treatment: Gait belt;Oxygen Activity Tolerance: Patient tolerated treatment well Patient left: in chair;with call bell/phone within reach Nurse Communication: Mobility status PT Visit Diagnosis: Other abnormalities of gait and mobility (R26.89);Muscle weakness (generalized) (M62.81);Pain Pain - Right/Left:  (central) Pain - part of body:  (spine)    Time: 9183-9160 PT Time Calculation (min) (ACUTE ONLY): 23 min   Charges:   PT Evaluation $PT Eval Low Complexity: 1 Low   PT General Charges $$ ACUTE PT VISIT: 1 Visit         Leontine Hilt DPT Acute Rehab Services [phone] Prefer contact via chat   Leontine NOVAK Evalie Hargraves 07/31/2024, 9:31 AM

## 2024-07-31 NOTE — Progress Notes (Signed)
 OT Cancellation Note  Patient Details Name: Denise [name] MRN: [id number] DOB: May 17, 1961   Cancelled Treatment:    Reason Eval/Treat Not Completed: OT screened, no needs identified, will sign off. Per PT, pt at baseline for ADLs, no OT related concerns. OT is signing off on this pt, please re-consult as necessary.   Eldridge Marcott C, OT  Acute Rehabilitation Services Office [phone] Secure chat preferred   Adrianne GORMAN Savers 07/31/2024, 12:30 PM

## 2024-07-31 NOTE — Progress Notes (Signed)
 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  Assessment & Plan:   Principal Problem:   Acute respiratory failure with hypoxia (HCC) Active Problems:   SOB (shortness of breath)   Influenza A with pneumonia   Asymptomatic carotid artery stenosis without infarction, left   History of GI bleed   COPD (chronic obstructive pulmonary disease) (HCC)   SIADH (syndrome of inappropriate ADH production)   Acute on chronic anemia   Essential hypertension   Former smoker   Lung nodule   Anxiety and depression   Hyperlipidemia   Iron  deficiency anemia due to chronic blood loss   CAD (coronary artery disease)   Gastric AVM   PAF (paroxysmal atrial fibrillation) (HCC)   (HFpEF) heart failure with preserved ejection fraction (HCC)   Acute respiratory failure with hypoxia, multifactorial Influenza A infection/pneumonia likely primary Complicated by COPD history and HF history - unclear if acutely exacerbated at this time. - Baseline on room air - initially requiring 4L [place] to maintain sats at rest - Markedly improving over the past 24 hours with treatment of fluids symptomatic support - Continue to treat influenza A pneumonia(Tamiflu ), COPD(nebs, steroids), less likely heart failure given euvolemia - Wean oxygen as tolerated - Continue supportive care including nebs, incentive spirometry, flutter - Chronic tobacco abuse noted, lengthy advisement in regards to cessation at bedside  Influenza A with pneumonia Initiating Tamiflu  Procalcitonin remains  negative/low - hold antibiotics  COPD (chronic obstructive pulmonary disease) (HCC) -Questionably in exacerbation, likely triggered by influenza A  - On room air at baseline, wean oxygen as above - Remains on steroids, as needed nebulizers and home treatment   HFpEF, resolved -Initially elevated BNP with lower extremity edema has resolved with current regimen, transition back to p.o. furosemide , previously discontinued in the setting of worsening hyponatremia, appears to have stabilized - Repeat echocardiogram similar to previous with EF 65-70% grade 1 diastolic dysfunction  Acute on chronic anemia Anemia of chronic disease, with iron  deficiency -H&H stable, monitor closely   SIADH (syndrome of inappropriate ADH production) Acute on chronic hyponatremia, likely hypervolemic -Improving somewhat on on furosemide  -Continuing sodium supplements (1g daily) -Recommending close follow up with PCP to continue discussion on polypharmacy risks    PAF (paroxysmal atrial fibrillation) (HCC) - Currently in normal sinus rhythm, Continue Eliquis , metoprolol    Gastric AVM History of gastric AVM, with GI bleed Monitoring closely, high risk for bleeding due to antiplatelet and dual anticoagulation therapy H&H stable   CAD (coronary artery disease) - Without signs or symptoms of ACS, continue home statin Plavix  Eliquis   Iron  deficiency anemia due to chronic blood loss Continue iron  supplements -H&H stable Hyperlipidemia Continue statin Anxiety and depression -continue home buspirone  and escitalopram   History of GI bleed - Continue Protonix , on Plavix  and Eliquis  high risk for bleeding   Asymptomatic carotid artery stenosis without infarction, left Status post recent evaluation s/p TCAR 02/23/2024 -Patient discussed with pharmacy to confirm antiplatelet therapy and DOAC -Will continue patient's home medication of clopidogrel , Eliquis , statins - Continue Protonix    Lung nodule Status post  recent investigation, bronchoscopy -Close follow-up  with primary pulmonologist   Former smoker Per patient smoked for a long time, recently quit -Refused NicoDerm patch   Essential hypertension Reviewing home medication, resuming along with IV Lasix  Continue Lopressor , lisinopril , amlodipine     DVT prophylaxis: SCDs Start: 07/30/24 1039 apixaban  (ELIQUIS ) tablet 5 mg   Code Status:   Code Status: Full Code  Family Communication: At bedside  Status is: Inpatient  Dispo: The patient is from: Home              Anticipated d/c is to: To be determined              Anticipated d/c date is: To be determined              Patient currently not medically stable for discharge  Consultants:  None Procedures:  None planned  Antimicrobials:  Tamiflu   Subjective: No acute issues or events reported overnight, patient feels moderately improved from prior but not yet back to baseline.  Denies nausea vomiting diarrhea constipation headache fevers chills or chest pain  Objective: Vitals:   07/31/24 0002 07/31/24 0337 07/31/24 0355 07/31/24 0400  BP: (!) 170/87 133/67    Pulse:    73  Resp:    18  Temp: 98.4 F (36.9 C) 98.5 F (36.9 C)    TempSrc: Oral Oral    SpO2:   93% 96%  Weight:  72.2 kg    Height:        Intake/Output Summary (Last 24 hours) at 07/31/2024 0740 Last data filed at 07/30/2024 1622 Gross per 24 hour  Intake 96.01 ml  Output --  Net 96.01 ml   Filed Weights   07/30/24 0757 07/30/24 1525 07/31/24 0337  Weight: 74.8 kg 73.8 kg 72.2 kg    Examination:  General:  Pleasantly resting in bed, No acute distress. HEENT:  Normocephalic atraumatic.  Sclerae nonicteric, noninjected.  Extraocular movements intact bilaterally. Neck:  Without mass or deformity.  Trachea is midline. Lungs: Bilateral rhonchi without overt rales or wheeze. Heart:  Regular rate and rhythm.  Without murmurs, rubs, or gallops. Abdomen:  Soft, nontender, nondistended.  Without guarding  or rebound. Extremities: Without cyanosis, clubbing, no edema noted Skin:  Warm and dry, no erythema.   Data Reviewed: I have personally reviewed following labs and imaging studies  CBC: Recent Labs  Lab 07/30/24 0830 07/30/24 0856  WBC 7.0  --   NEUTROABS 6.2  --   HGB 13.6 16.7*  16.7*  HCT 43.9 49.0*  49.0*  MCV 91.8  --   PLT 273  --    Basic Metabolic Panel: Recent Labs  Lab 07/30/24 0830 07/30/24 0856 07/30/24 0946 07/30/24 1055  NA 120* 120*  120*  --  121*  K 4.1 4.0  4.0  --   --   CL 83* 84*  --   --   CO2 26  --   --   --   GLUCOSE 110* 113*  --   --   BUN <5* <3*  --   --   CREATININE 0.38* 0.40*  --   --   CALCIUM  8.7*  --   --   --   MG  --   --  1.6* 1.7  PHOS  --   --   --  2.6   GFR: Estimated Creatinine Clearance: 69.4 mL/min (A) (by C-G formula based on SCr of 0.4 mg/dL (L)). Liver Function Tests: Recent Labs  Lab 07/30/24 0830  AST 171*  ALT  126*  ALKPHOS 251*  BILITOT 0.4  PROT 7.3  ALBUMIN  4.3   Recent Labs  Lab 07/30/24 0946  LIPASE 16   No results for input(s): AMMONIA in the last 168 hours. Coagulation Profile: No results for input(s): INR, PROTIME in the last 168 hours. Cardiac Enzymes: No results for input(s): CKTOTAL, CKMB, CKMBINDEX, TROPONINI in the last 168 hours. BNP (last 3 results) Recent Labs    07/30/24 0830  PROBNP 1,053.0*   HbA1C: No results for input(s): HGBA1C in the last 72 hours. CBG: Recent Labs  Lab 07/30/24 2039  GLUCAP 140*   Lipid Profile: No results for input(s): CHOL, HDL, LDLCALC, TRIG, CHOLHDL, LDLDIRECT in the last 72 hours. Thyroid  Function Tests: No results for input(s): TSH, T4TOTAL, FREET4, T3FREE, THYROIDAB in the last 72 hours. Anemia Panel: No results for input(s): VITAMINB12, FOLATE, FERRITIN, TIBC, IRON , RETICCTPCT in the last 72 hours. Sepsis Labs: Recent Labs  Lab 07/30/24 0857 07/30/24 1055  PROCALCITON  --  0.14   LATICACIDVEN 1.2  --     Recent Results (from the past 240 hours)  Resp panel by RT-PCR (RSV, Flu A&B, Covid) Anterior Nasal Swab     Status: Abnormal   Collection Time: 07/30/24  8:31 AM   Specimen: Anterior Nasal Swab  Result Value Ref Range Status   SARS Coronavirus 2 by RT PCR NEGATIVE NEGATIVE Final   Influenza A by PCR POSITIVE (A) NEGATIVE Final   Influenza B by PCR NEGATIVE NEGATIVE Final    Comment: (NOTE) The Xpert Xpress SARS-CoV-2/FLU/RSV plus assay is intended as an aid in the diagnosis of influenza from Nasopharyngeal swab specimens and should not be used as a sole basis for treatment. Nasal washings and aspirates are unacceptable for Xpert Xpress SARS-CoV-2/FLU/RSV testing.  Fact Sheet for Patients: bloggercourse.com  Fact Sheet for Healthcare Providers: seriousbroker.it  This test is not yet approved or cleared by the [place]  FDA and has been authorized for detection and/or diagnosis of SARS-CoV-2 by FDA under an Emergency Use Authorization (EUA). This EUA will remain in effect (meaning this test can be used) for the duration of the COVID-19 declaration under Section 564(b)(1) of the Act, 21 U.S.C. section 360bbb-3(b)(1), unless the authorization is terminated or revoked.     Resp Syncytial Virus by PCR NEGATIVE NEGATIVE Final    Comment: (NOTE) Fact Sheet for Patients: bloggercourse.com  Fact Sheet for Healthcare Providers: seriousbroker.it  This test is not yet approved or cleared by the [place]  FDA and has been authorized for detection and/or diagnosis of SARS-CoV-2 by FDA under an Emergency Use Authorization (EUA). This EUA will remain in effect (meaning this test can be used) for the duration of the COVID-19 declaration under Section 564(b)(1) of the Act, 21 U.S.C. section 360bbb-3(b)(1), unless the authorization is terminated  or revoked.  Performed at [facility], [address] [place], [place] [address]          Radiology Studies: ECHOCARDIOGRAM COMPLETE Result Date: 07/30/2024    ECHOCARDIOGRAM REPORT   Patient Name:   Elisea Khader Date of Exam: 07/30/2024 Medical Rec #:  [id number]           Height:       63.0 in Accession #:    [id number]          Weight:       165.0 lb Date of Birth:  Feb 14, 1961            BSA:  1.782 m Patient Age:    63 years            BP:           170/90 mmHg Patient Gender: F                   HR:           75 bpm. Exam Location:  Inpatient Procedure: 2D Echo, Cardiac Doppler and Color Doppler (Both Spectral and Color            Flow Doppler were utilized during procedure). Indications:    CHF  History:        Patient has prior history of Echocardiogram examinations, most                 recent 02/22/2024. CHF, CAD, COPD, Aortic Valve Disease,                 Signs/Symptoms:Shortness of Breath; Risk Factors:Hypertension                 and Dyslipidemia.  Sonographer:    Philomena Daring Referring Phys: ADRIANA DELENA GRAMS IMPRESSIONS  1. Left ventricular ejection fraction, by estimation, is 65 to 70%. The left ventricle has normal function. The left ventricle has no regional wall motion abnormalities. Left ventricular diastolic parameters are consistent with Grade I diastolic dysfunction (impaired relaxation).  2. Right ventricular systolic function is normal. The right ventricular size is normal. Tricuspid regurgitation signal is inadequate for assessing PA pressure.  3. The mitral valve is normal in structure. Trivial mitral valve regurgitation. No evidence of mitral stenosis.  4. The tricuspid valve is abnormal.  5. The aortic valve was not well visualized. There is moderate calcification of the aortic valve. There is moderate thickening of the aortic valve. Aortic valve regurgitation is mild. Moderate aortic valve stenosis. Aortic valve area, by VTI measures 1.31 cm. Aortic  valve mean gradient measures 22.0 mmHg. Aortic valve Vmax measures 3.15 m/s.  6. The inferior vena cava is normal in size with greater than 50% respiratory variability, suggesting right atrial pressure of 3 mmHg. FINDINGS  Left Ventricle: Left ventricular ejection fraction, by estimation, is 65 to 70%. The left ventricle has normal function. The left ventricle has no regional wall motion abnormalities. The left ventricular internal cavity size was normal in size. There is  no left ventricular hypertrophy. Left ventricular diastolic parameters are consistent with Grade I diastolic dysfunction (impaired relaxation). Normal left ventricular filling pressure. Right Ventricle: The right ventricular size is normal. Right vetricular wall thickness was not well visualized. Right ventricular systolic function is normal. Tricuspid regurgitation signal is inadequate for assessing PA pressure. The tricuspid regurgitant velocity is 3.32 m/s, and with an assumed right atrial pressure of 3 mmHg, the estimated right ventricular systolic pressure is 47.1 mmHg. Left Atrium: Left atrial size was normal in size. Right Atrium: Right atrial size was normal in size. Pericardium: There is no evidence of pericardial effusion. Mitral Valve: The mitral valve is normal in structure. There is mild thickening of the mitral valve leaflet(s). There is mild calcification of the mitral valve leaflet(s). Mild mitral annular calcification. Trivial mitral valve regurgitation. No evidence  of mitral valve stenosis. Tricuspid Valve: The tricuspid valve is abnormal. Tricuspid valve regurgitation is mild . No evidence of tricuspid stenosis. Aortic Valve: The aortic valve was not well visualized. There is moderate calcification of the aortic valve. There is moderate thickening of the aortic valve. There is moderate  aortic valve annular calcification. Aortic valve regurgitation is mild. Moderate aortic stenosis is present. Aortic valve mean gradient measures  22.0 mmHg. Aortic valve peak gradient measures 39.7 mmHg. Aortic valve area, by VTI measures 1.31 cm. Pulmonic Valve: The pulmonic valve was not well visualized. Pulmonic valve regurgitation is not visualized. No evidence of pulmonic stenosis. Aorta: The aortic root and ascending aorta are structurally normal, with no evidence of dilitation. Venous: The inferior vena cava is normal in size with greater than 50% respiratory variability, suggesting right atrial pressure of 3 mmHg. IAS/Shunts: No atrial level shunt detected by color flow Doppler.  LEFT VENTRICLE PLAX 2D LVIDd:         4.40 cm   Diastology LVIDs:         3.00 cm   LV e' medial:    7.51 cm/s LV PW:         1.10 cm   LV E/e' medial:  13.1 LV IVS:        0.90 cm   LV e' lateral:   8.49 cm/s LVOT diam:     2.00 cm   LV E/e' lateral: 11.6 LV SV:         75 LV SV Index:   42 LVOT Area:     3.14 cm  RIGHT VENTRICLE             IVC RV Basal diam:  3.20 cm     IVC diam: 1.70 cm RV Mid diam:    2.40 cm RV S prime:     12.90 cm/s TAPSE (M-mode): 1.9 cm LEFT ATRIUM             Index        RIGHT ATRIUM           Index LA diam:        3.80 cm 2.13 cm/m   RA Area:     14.00 cm LA Vol (A2C):   49.7 ml 27.89 ml/m  RA Volume:   26.60 ml  14.93 ml/m LA Vol (A4C):   46.2 ml 25.93 ml/m LA Biplane Vol: 50.9 ml 28.57 ml/m  AORTIC VALVE AV Area (Vmax):    1.19 cm AV Area (Vmean):   1.14 cm AV Area (VTI):     1.31 cm AV Vmax:           315.00 cm/s AV Vmean:          217.000 cm/s AV VTI:            0.574 m AV Peak Grad:      39.7 mmHg AV Mean Grad:      22.0 mmHg LVOT Vmax:         119.00 cm/s LVOT Vmean:        78.850 cm/s LVOT VTI:          0.240 m LVOT/AV VTI ratio: 0.42  AORTA Ao Root diam: 2.50 cm Ao Asc diam:  2.80 cm MITRAL VALVE                TRICUSPID VALVE MV Area (PHT): 4.80 cm     TR Peak grad:   44.1 mmHg MV Decel Time: 158 msec     TR Vmax:        332.00 cm/s MV E velocity: 98.50 cm/s MV A velocity: 157.00 cm/s  SHUNTS MV E/A ratio:  0.63          Systemic VTI:  0.24 m  Systemic Diam: 2.00 cm Dorn Ross MD Electronically signed by Dorn Ross MD Signature Date/Time: 07/30/2024/5:12:53 PM    Final    DG Chest 2 View Result Date: 07/30/2024 CLINICAL DATA:  Shortness of breath, rhonchi. EXAM: CHEST - 2 VIEW COMPARISON:  07/15/2024 and CT chest 07/16/2024. FINDINGS: Trachea is midline. Heart is enlarged. Thoracic aorta is calcified. 1.6 cm nodule in the superior segment left lower lobe. No airspace consolidation. Trace bilateral pleural effusions. Minimal streaky bibasilar atelectasis. Old right rib fractures.  Degenerative changes in the spine. IMPRESSION: 1. Tiny bilateral pleural effusions with minimal streaky bibasilar atelectasis. 2. Superior segment left lower lobe nodule, previously evaluated by PET 03/30/2024. Indolent adenocarcinoma cannot be excluded. Electronically Signed   By: Newell Eke M.D.   On: 07/30/2024 10:19        Scheduled Meds:  amLODipine   10 mg Oral Daily   apixaban   5 mg Oral BID   atorvastatin   80 mg Oral Daily   busPIRone   10 mg Oral BID   clopidogrel   75 mg Oral Daily   escitalopram   5 mg Oral Daily   ferrous sulfate   325 mg Oral BID   furosemide   40 mg Intravenous Q12H   ipratropium  0.5 mg Nebulization Q6H   levalbuterol   1.25 mg Nebulization Q6H   methylPREDNISolone  (SOLU-MEDROL ) injection  40 mg Intravenous Q12H   metoprolol  tartrate  50 mg Oral BID   oseltamivir   75 mg Oral BID   pantoprazole   40 mg Oral Daily   sodium chloride  flush  3 mL Intravenous Q12H   sodium chloride   1 g Oral BID WC   Continuous Infusions:   LOS: 0 days   Time spent:  Elsie JAYSON [place], DO [facility]  If 7PM-7AM, please contact night-coverage [email or web address]  07/31/2024, 7:40 AM      "

## 2024-07-31 NOTE — TOC CM/SW Note (Signed)
 Transition of Care [facility]) - Inpatient Brief Assessment   Patient Details  Name: Denise [name] MRN: [id number] Date of Birth: 07-28-1961  [facility] CM/SW Contact:    Waddell Barnie Rama, RN Phone Number: 07/31/2024, 2:41 PM   Clinical Narrative: From home with daughter, has PCP and insurance on file, states has no HH services in place at this time , has walker and a bsc at home.  States family member (son)  will transport them home at [employer] and family is support system, states gets medications from [facility] in [place].  Pta self ambulatory.   There are no ICM needs identified  at this time.  Please place consult for ICM needs.     Transition of Care Asessment: Insurance and Status: Insurance coverage has been reviewed Patient has primary care physician: Yes Home environment has been reviewed: home with daughter Prior level of function:: indep Prior/Current Home Services: Current home services (walker, bsc) Social Drivers of Health Review: SDOH reviewed no interventions necessary Readmission risk has been reviewed: Yes Transition of care needs: no transition of care needs at this time

## 2024-08-01 DIAGNOSIS — J9601 Acute respiratory failure with hypoxia: Secondary | ICD-10-CM | POA: Diagnosis not present

## 2024-08-01 LAB — BASIC METABOLIC PANEL WITH GFR
Anion gap: 10 (ref 5–15)
BUN: 24 mg/dL — ABNORMAL HIGH (ref 8–23)
CO2: 28 mmol/L (ref 22–32)
Calcium: 9.3 mg/dL (ref 8.9–10.3)
Chloride: 88 mmol/L — ABNORMAL LOW (ref 98–111)
Creatinine, Ser: 0.72 mg/dL (ref 0.44–1.00)
GFR, Estimated: >60 mL/min
Glucose, Bld: 116 mg/dL — ABNORMAL HIGH (ref 70–99)
Potassium: 4 mmol/L (ref 3.5–5.1)
Sodium: 125 mmol/L — ABNORMAL LOW (ref 135–145)

## 2024-08-01 NOTE — Plan of Care (Signed)

## 2024-08-01 NOTE — Progress Notes (Signed)
 Mobility Specialist Progress Note:    08/01/24 1000  Mobility  Activity Ambulated with assistance  Level of Assistance Standby assist, set-up cues, supervision of patient - no hands on  Assistive Device None  Distance Ambulated (ft) 200 ft  Activity Response Tolerated well  Mobility Referral Yes  Mobility visit 1 Mobility  Mobility Specialist Start Time (ACUTE ONLY) 0940  Mobility Specialist Stop Time (ACUTE ONLY) 0951  Mobility Specialist Time Calculation (min) (ACUTE ONLY) 11 min   Pt received in bed agreeable to mobility. Found on 2L/min Spo2 was 96%. Attempted to ambulate on 2L/min but d/t desat to 86% O2 flow increased to 3L/min, VSS. No c/o throughout. Returned to room w/o fault. Left seated EOB w/ call bell and personal belongings in reach All needs met.  Thersia Minder Mobility Specialist  Please contact vis Secure Chat or  Rehab Office [phone]

## 2024-08-01 NOTE — Progress Notes (Addendum)
 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  Assessment & Plan:   Principal Problem:   Acute respiratory failure with hypoxia (HCC) Active Problems:   SOB (shortness of breath)   Influenza A with pneumonia   Asymptomatic carotid artery stenosis without infarction, left   History of GI bleed   COPD (chronic obstructive pulmonary disease) (HCC)   SIADH (syndrome of inappropriate ADH production)   Acute on chronic anemia   Essential hypertension   Former smoker   Lung nodule   Anxiety and depression   Hyperlipidemia   Iron  deficiency anemia due to chronic blood loss   CAD (coronary artery disease)   Gastric AVM   PAF (paroxysmal atrial fibrillation) (HCC)   (HFpEF) heart failure with preserved ejection fraction (HCC)   Acute respiratory failure with hypoxia, multifactorial Influenza A infection/pneumonia likely primary Complicated by COPD history and HF history - unclear if acutely exacerbated at this time. - Baseline on room air - initially requiring 4L [place] to maintain sats at rest - Markedly improving over the past 24 hours but continues to have profound hypoxia with exertion - Continue to treat influenza A pneumonia(Tamiflu ), COPD(nebs, steroids), less likely heart failure given euvolemia - Wean oxygen as tolerated - Continue supportive care including nebs, incentive spirometry, flutter - Chronic tobacco abuse noted, lengthy advisement in regards to cessation at bedside  Influenza A with pneumonia Initiating Tamiflu  Procalcitonin  remains negative/low - hold antibiotics  COPD (chronic obstructive pulmonary disease) (HCC) -Questionably in exacerbation, likely triggered by influenza A  - On room air at baseline, wean oxygen as above - Remains on steroids, as needed nebulizers and home treatment   HFpEF, resolved -Initially elevated BNP with lower extremity edema has resolved with current regimen, transition back to p.o. furosemide , previously discontinued in the setting of worsening hyponatremia, appears to have stabilized - Repeat echocardiogram similar to previous with EF 65-70% grade 1 diastolic dysfunction  Acute on chronic anemia Anemia of chronic disease, with iron  deficiency -H&H stable, monitor closely   SIADH (syndrome of inappropriate ADH production) Acute on chronic hyponatremia, likely hypervolemic -Improving somewhat on on furosemide  -Continuing sodium supplements (1g daily) -Recommending close follow up with PCP to continue discussion on polypharmacy risks    PAF (paroxysmal atrial fibrillation) (HCC) - Currently in normal sinus rhythm, Continue Eliquis , metoprolol    Gastric AVM History of gastric AVM, with GI bleed Monitoring closely, high risk for bleeding due to antiplatelet and dual anticoagulation therapy H&H stable   CAD (coronary artery disease) - Without signs or symptoms of ACS, continue home statin Plavix  Eliquis   Iron  deficiency anemia due to chronic blood loss Continue iron  supplements -H&H stable Hyperlipidemia Continue statin Anxiety and depression -continue home buspirone  and escitalopram   History of GI bleed - Continue Protonix , on Plavix  and Eliquis  high risk for bleeding   Asymptomatic carotid artery stenosis without infarction, left Status post recent evaluation s/p TCAR 02/23/2024 -Patient discussed with pharmacy to confirm antiplatelet therapy and DOAC -Will continue patient's home medication of clopidogrel , Eliquis , statins - Continue Protonix    Lung nodule Status  post recent investigation, bronchoscopy -  Close follow-up with primary pulmonologist   Former smoker Per patient smoked for a long time, recently quit -Refused NicoDerm patch   Essential hypertension Reviewing home medication, resuming along with IV Lasix  Continue Lopressor , lisinopril , amlodipine     DVT prophylaxis: SCDs Start: 07/30/24 1039 apixaban  (ELIQUIS ) tablet 5 mg   Code Status:   Code Status: Full Code  Family Communication: At bedside  Status is: Inpatient  Dispo: The patient is from: Home              Anticipated d/c is to: To be determined              Anticipated d/c date is: To be determined              Patient currently not medically stable for discharge given ongoing profound hypoxia from baseline  Consultants:  None Procedures:  None planned  Antimicrobials:  Tamiflu   Subjective: No acute issues or events reported overnight, patient feels much better clinically although still complaining of profound shortness of breath  Objective: Vitals:   08/01/24 0801 08/01/24 0845 08/01/24 1059 08/01/24 1306  BP:  122/82 122/82 (!) 117/52  Pulse:  76 85 68  Resp:  18  (!) 21  Temp:  97.8 F (36.6 C)  97.7 F (36.5 C)  TempSrc:  Oral  Oral  SpO2: 97% 93%  93%  Weight:      Height:        Intake/Output Summary (Last 24 hours) at 08/01/2024 1420 Last data filed at 08/01/2024 1300 Gross per 24 hour  Intake 720 ml  Output --  Net 720 ml   Filed Weights   07/30/24 1525 07/31/24 0337 08/01/24 0300  Weight: 73.8 kg 72.2 kg 72.1 kg    Examination:  General:  Pleasantly resting in bed, No acute distress. HEENT:  Normocephalic atraumatic.  Sclerae nonicteric, noninjected.  Extraocular movements intact bilaterally. Neck:  Without mass or deformity.  Trachea is midline. Lungs: Bilateral rhonchi without overt rales or wheeze. Heart:  Regular rate and rhythm.  Without murmurs, rubs, or gallops. Abdomen:  Soft, nontender, nondistended.  Without guarding or  rebound. Extremities: Without cyanosis, clubbing, no edema noted Skin:  Warm and dry, no erythema.   Data Reviewed: I have personally reviewed following labs and imaging studies  CBC: Recent Labs  Lab 07/30/24 0830 07/30/24 0856  WBC 7.0  --   NEUTROABS 6.2  --   HGB 13.6 16.7*  16.7*  HCT 43.9 49.0*  49.0*  MCV 91.8  --   PLT 273  --    Basic Metabolic Panel: Recent Labs  Lab 07/30/24 0830 07/30/24 0856 07/30/24 0946 07/30/24 1055 07/31/24 0748 08/01/24 0356  NA 120* 120*  120*  --  121* 125* 125*  K 4.1 4.0  4.0  --   --  4.1 4.0  CL 83* 84*  --   --  88* 88*  CO2 26  --   --   --  26 28  GLUCOSE 110* 113*  --   --  126* 116*  BUN <5* <3*  --   --  15 24*  CREATININE 0.38* 0.40*  --   --  0.67 0.72  CALCIUM  8.7*  --   --   --  9.3 9.3  MG  --   --  1.6* 1.7  --   --   PHOS  --   --   --  2.6  --   --    GFR: Estimated  Creatinine Clearance: 69.4 mL/min (by C-G formula based on SCr of 0.72 mg/dL). Liver Function Tests: Recent Labs  Lab 07/30/24 0830  AST 171*  ALT 126*  ALKPHOS 251*  BILITOT 0.4  PROT 7.3  ALBUMIN  4.3   Recent Labs  Lab 07/30/24 0946  LIPASE 16   No results for input(s): AMMONIA in the last 168 hours. Coagulation Profile: No results for input(s): INR, PROTIME in the last 168 hours. Cardiac Enzymes: No results for input(s): CKTOTAL, CKMB, CKMBINDEX, TROPONINI in the last 168 hours. BNP (last 3 results) Recent Labs    07/30/24 0830  PROBNP 1,053.0*   HbA1C: No results for input(s): HGBA1C in the last 72 hours. CBG: Recent Labs  Lab 07/30/24 2039  GLUCAP 140*   Lipid Profile: No results for input(s): CHOL, HDL, LDLCALC, TRIG, CHOLHDL, LDLDIRECT in the last 72 hours. Thyroid  Function Tests: No results for input(s): TSH, T4TOTAL, FREET4, T3FREE, THYROIDAB in the last 72 hours. Anemia Panel: No results for input(s): VITAMINB12, FOLATE, FERRITIN, TIBC, IRON , RETICCTPCT in  the last 72 hours. Sepsis Labs: Recent Labs  Lab 07/30/24 0857 07/30/24 1055  PROCALCITON  --  0.14  LATICACIDVEN 1.2  --     Recent Results (from the past 240 hours)  Resp panel by RT-PCR (RSV, Flu A&B, Covid) Anterior Nasal Swab     Status: Abnormal   Collection Time: 07/30/24  8:31 AM   Specimen: Anterior Nasal Swab  Result Value Ref Range Status   SARS Coronavirus 2 by RT PCR NEGATIVE NEGATIVE Final   Influenza A by PCR POSITIVE (A) NEGATIVE Final   Influenza B by PCR NEGATIVE NEGATIVE Final    Comment: (NOTE) The Xpert Xpress SARS-CoV-2/FLU/RSV plus assay is intended as an aid in the diagnosis of influenza from Nasopharyngeal swab specimens and should not be used as a sole basis for treatment. Nasal washings and aspirates are unacceptable for Xpert Xpress SARS-CoV-2/FLU/RSV testing.  Fact Sheet for Patients: bloggercourse.com  Fact Sheet for Healthcare Providers: seriousbroker.it  This test is not yet approved or cleared by the [place]  FDA and has been authorized for detection and/or diagnosis of SARS-CoV-2 by FDA under an Emergency Use Authorization (EUA). This EUA will remain in effect (meaning this test can be used) for the duration of the COVID-19 declaration under Section 564(b)(1) of the Act, 21 U.S.C. section 360bbb-3(b)(1), unless the authorization is terminated or revoked.     Resp Syncytial Virus by PCR NEGATIVE NEGATIVE Final    Comment: (NOTE) Fact Sheet for Patients: bloggercourse.com  Fact Sheet for Healthcare Providers: seriousbroker.it  This test is not yet approved or cleared by the [place]  FDA and has been authorized for detection and/or diagnosis of SARS-CoV-2 by FDA under an Emergency Use Authorization (EUA). This EUA will remain in effect (meaning this test can be used) for the duration of the COVID-19 declaration under Section  564(b)(1) of the Act, 21 U.S.C. section 360bbb-3(b)(1), unless the authorization is terminated or revoked.  Performed at [facility], [address], [place], [place] [address]          Radiology Studies: No results found.       Scheduled Meds:  amLODipine   10 mg Oral Daily   apixaban   5 mg Oral BID   atorvastatin   80 mg Oral Daily   busPIRone   10 mg Oral BID   clopidogrel   75 mg Oral Daily   escitalopram   5 mg Oral Daily   ferrous sulfate   325 mg Oral BID  fluticasone  furoate-vilanterol  1 puff Inhalation Daily   furosemide   40 mg Oral Daily   methylPREDNISolone  (SOLU-MEDROL ) injection  40 mg Intravenous Q12H   metoprolol  tartrate  50 mg Oral BID   oseltamivir   75 mg Oral BID   pantoprazole   40 mg Oral Daily   sodium chloride  flush  3 mL Intravenous Q12H   sodium chloride   1 g Oral BID WC   Continuous Infusions:   LOS: 1 day   Time spent:  Elsie JAYSON Montclair, DO [facility]  If 7PM-7AM, please contact night-coverage [email or web address]  08/01/2024, 2:20 PM      "

## 2024-08-02 ENCOUNTER — Other Ambulatory Visit (HOSPITAL_COMMUNITY): Payer: Self-pay

## 2024-08-02 DIAGNOSIS — J9601 Acute respiratory failure with hypoxia: Secondary | ICD-10-CM | POA: Diagnosis not present

## 2024-08-02 LAB — BASIC METABOLIC PANEL WITH GFR
Anion gap: 10 (ref 5–15)
BUN: 19 mg/dL (ref 8–23)
CO2: 28 mmol/L (ref 22–32)
Calcium: 9.4 mg/dL (ref 8.9–10.3)
Chloride: 91 mmol/L — ABNORMAL LOW (ref 98–111)
Creatinine, Ser: 0.61 mg/dL (ref 0.44–1.00)
GFR, Estimated: >60 mL/min
Glucose, Bld: 114 mg/dL — ABNORMAL HIGH (ref 70–99)
Potassium: 4.4 mmol/L (ref 3.5–5.1)
Sodium: 129 mmol/L — ABNORMAL LOW (ref 135–145)

## 2024-08-02 LAB — GLUCOSE, CAPILLARY: Glucose-Capillary: 127 mg/dL — ABNORMAL HIGH (ref 70–99)

## 2024-08-02 MED ORDER — FUROSEMIDE 20 MG PO TABS
20.0000 mg | ORAL_TABLET | Freq: Every day | ORAL | 0 refills | Status: DC
  Filled 2024-08-02: qty 30, 30d supply, fill #0

## 2024-08-02 MED ORDER — OSELTAMIVIR PHOSPHATE 75 MG PO CAPS
75.0000 mg | ORAL_CAPSULE | Freq: Two times a day (BID) | ORAL | 0 refills | Status: AC
  Filled 2024-08-02: qty 2, 1d supply, fill #0
  Filled 2024-08-02: qty 1, 1d supply, fill #0

## 2024-08-02 MED ORDER — PREDNISONE 10 MG PO TABS
ORAL_TABLET | ORAL | 0 refills | Status: AC
  Filled 2024-08-02: qty 30, 12d supply, fill #0

## 2024-08-02 NOTE — Discharge Summary (Signed)
 Physician Discharge Summary  Denise [name] FMW:[id number] DOB: 14-Jul-1961 DOA: 07/30/2024  PCP: Gretta Comer POUR, NP  Admit date: 07/30/2024 Discharge date: 08/02/2024  Admitted From: Home Disposition: Home  Recommendations for Outpatient Follow-up:  Follow up with PCP in 1-2 weeks  Home Health: None Equipment/Devices: Oxygen, 2 L nasal cannula at rest and with exertion  Discharge Condition: Stable CODE STATUS: Full Diet recommendation: Low-fat low-carb diet  Brief/Interim Summary: Denise [name] is a 63 year old female, chronic anemia, HTN, HLD, anxiety, depression, aortic stenosis, insufficiency, S/P TCAR 7/25, P afib (on Eliquis ), CAD, PVD, emphysema, COPD (not O2 dependent at baseline), GERD, 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  Patient admitted with acute hypoxic respiratory failure due to acute influenza A infection/pneumonia complicated by COPD exacerbation.  Patient also noted to be volume overloaded concerning for HFpEF exacerbation, diuresed quite well.  Patient continues to be on some low-grade oxygen even with exertion and rest today requiring 2 L nasal cannula.  She appears otherwise to be back improving drastically and stable for discharge home, of note she has had an emergency in the family (her brother died yesterday) -as such we will discharge patient with oxygen for close outpatient follow-up with PCP to wean over the next few weeks as appropriate.  This may very well be her new baseline but hoping with supportive care and ongoing treatment patient's oxygen supplementation will be weaned off as appropriate.  Given social history of smoking (? ongoing) with lung nodule noted on imaging patient has been recommended to follow-up with [facility] pulmonology for further screening and imaging.   Discharge Diagnoses:  Principal Problem:   Acute respiratory failure with  hypoxia (HCC) Active Problems:   SOB (shortness of breath)   Influenza A with pneumonia   Asymptomatic carotid artery stenosis without infarction, left   History of GI bleed   COPD (chronic obstructive pulmonary disease) (HCC)   SIADH (syndrome of inappropriate ADH production)   Acute on chronic anemia   Essential hypertension   Former smoker   Lung nodule   Anxiety and depression   Hyperlipidemia   Iron  deficiency anemia due to chronic blood loss   CAD (coronary artery disease)   Gastric AVM   PAF (paroxysmal atrial fibrillation) (HCC)   (HFpEF) heart failure with preserved ejection fraction (HCC)  Acute respiratory failure with hypoxia, multifactorial Influenza A infection/pneumonia likely primary Complicated by COPD history and HF history - unclear if acutely exacerbated at this time. - Baseline on room air -continues to require 2 L nasal cannula as above, continue Tamiflu  and symptomatic management as above - Chronic tobacco abuse noted, lengthy advisement in regards to cessation at bedside -pulmonology consult as below   Influenza A with pneumonia Complete Tamiflu  12/29, no indication for antibiotics at this time, procalcitonin negative   COPD (chronic obstructive pulmonary disease) (HCC) -Questionably in exacerbation, likely triggered by influenza A  - Questionably concurrent exacerbation, continue supplemental oxygen   HFpEF, resolved -Initially elevated BNP with lower extremity edema has resolved with current regimen, transition back to p.o. furosemide , previously discontinued in the setting of worsening hyponatremia, appears to have stabilized - Repeat echocardiogram similar to previous with EF 65-70% grade 1 diastolic dysfunction   Acute on chronic anemia Anemia of chronic disease, with iron  deficiency -H&H stable, monitor closely   SIADH (syndrome of inappropriate ADH production) Acute on chronic hyponatremia, likely hypervolemic -Improving somewhat on on  furosemide  -Continuing sodium  supplements (1g daily) -Recommending close follow up with PCP to continue discussion on polypharmacy risks    PAF (paroxysmal atrial fibrillation) (HCC) - Currently in normal sinus rhythm, Continue Eliquis , metoprolol    Gastric AVM History of gastric AVM, with GI bleed -Remains high risk for bleeding given anticoagulation/antiplatelet requirements   CAD (coronary artery disease) - Without signs or symptoms of ACS, continue home statin Plavix  Eliquis    Iron  deficiency anemia due to chronic blood loss Continue iron  supplements Hyperlipidemia Continue statin Anxiety and depression -continue home buspirone  and escitalopram  History of GI bleed - Continue Protonix , on Plavix  and Eliquis  high risk for bleeding   Asymptomatic carotid artery stenosis without infarction, left Status post recent evaluation s/p TCAR 02/23/2024 -Patient discussed with pharmacy to confirm antiplatelet therapy and DOAC -Will continue patient's home medication of clopidogrel , Eliquis , statins - Continue Protonix    Lung nodule - Status post recent investigation, bronchoscopy -Close follow-up with primary pulmonologist ongoing   Former smoker Per patient smoked for a long time, recently quit -Refused NicoDerm patch   Essential hypertension Reviewing home medication, resuming along with IV Lasix  Continue Lopressor , amlodipine  -continue lisinopril  as above Discharge Instructions  Discharge Instructions     Ambulatory Referral for Lung Cancer Scre   Complete by: As directed    Diet - low sodium heart healthy   Complete by: As directed    Increase activity slowly   Complete by: As directed       Allergies as of 08/02/2024   No Known Allergies      Medication List     STOP taking these medications    lisinopril  30 MG tablet Commonly known as: ZESTRIL        TAKE these medications    acetaminophen  500 MG tablet Commonly known as: TYLENOL  Take 2 tablets (1,000  mg total) by mouth every 8 (eight) hours as needed.   albuterol  108 (90 Base) MCG/ACT inhaler Commonly known as: VENTOLIN  HFA INHALE 2 PUFFS INTO THE LUNGS EVERY 4-6 HOURS AS NEEDED FOR TIGHTNESS/WHEEZING   amLODipine  10 MG tablet Commonly known as: NORVASC  Take 1 tablet (10 mg total) by mouth daily.   apixaban  5 MG Tabs tablet Commonly known as: ELIQUIS  Take 1 tablet (5 mg total) by mouth 2 (two) times daily.   atorvastatin  80 MG tablet Commonly known as: LIPITOR  TAKE 1 TABLET (80 MG TOTAL) BY MOUTH DAILY FOR CHOLESTEROL   busPIRone  10 MG tablet Commonly known as: BUSPAR  TAKE 1 TABLET (10 MG TOTAL) BY MOUTH 2 (TWO) TIMES DAILY. FOR ANXIETY   clopidogrel  75 MG tablet Commonly known as: PLAVIX  Take 75 mg by mouth daily.   cyanocobalamin  1000 MCG tablet Commonly known as: VITAMIN B12 Take 1,000 mcg by mouth daily.   escitalopram  20 MG tablet Commonly known as: LEXAPRO  TAKE 1 TABLET BY MOUTH DAILY FOR ANXIETY   ferrous sulfate  325 (65 FE) MG tablet Take 1 tablet (325 mg total) by mouth 2 (two) times daily. For iron    fluticasone -salmeterol 250-50 MCG/ACT Aepb Commonly known as: Wixela Inhub Inhale 1 puff into the lungs in the morning and at bedtime.   furosemide  20 MG tablet Commonly known as: LASIX  Take 1 tablet (20 mg total) by mouth daily. Start taking on: August 03, 2024   metoprolol  tartrate 50 MG tablet Commonly known as: LOPRESSOR  Take 1 tablet (50 mg total) by mouth 2 (two) times daily.   oseltamivir  75 MG capsule Commonly known as: TAMIFLU  Take 1 capsule (75 mg total) by mouth 2 (two) times  daily.   pantoprazole  40 MG tablet Commonly known as: PROTONIX  TAKE 1 TABLET (40 MG TOTAL) BY MOUTH DAILY. FOR HEARTBURN   predniSONE  10 MG tablet Commonly known as: DELTASONE  Take 4 tablets (40 mg total) by mouth daily for 3 days, THEN 3 tablets (30 mg total) daily for 3 days, THEN 2 tablets (20 mg total) daily for 3 days, THEN 1 tablet (10 mg total) daily for 3  days. Start taking on: August 02, 2024   pyridoxine  100 MG tablet Commonly known as: B-6 Take 100 mg by mouth daily.   sodium chloride  1 g tablet TAKE 1 TABLET (1 G TOTAL) BY MOUTH 2 (TWO) TIMES DAILY WITH A MEAL. TO INCREASE SODIUM   vitamin C  1000 MG tablet Take 2,000 mg by mouth daily.   vitamin E  180 MG (400 UNITS) capsule Take 400 Units by mouth daily.               Durable Medical Equipment  (From admission, onward)           Start     Ordered   08/02/24 1242  For home use only DME oxygen  Once       Question Answer Comment  Length of Need 6 Months   Mode or (Route) Nasal cannula   Liters per Minute 2   Frequency Continuous (stationary and portable oxygen unit needed)   Oxygen delivery system: Gas      08/02/24 1241            Allergies[1]  Consultations: Cardiology  Procedures/Studies: ECHOCARDIOGRAM COMPLETE Result Date: 07/30/2024    ECHOCARDIOGRAM REPORT   Patient Name:   Shon Mansouri Date of Exam: 07/30/2024 Medical Rec #:  [id number]           Height:       63.0 in Accession #:    [id number]          Weight:       165.0 lb Date of Birth:  Oct 04, 1960            BSA:          1.782 m Patient Age:    63 years            BP:           170/90 mmHg Patient Gender: F                   HR:           75 bpm. Exam Location:  Inpatient Procedure: 2D Echo, Cardiac Doppler and Color Doppler (Both Spectral and Color            Flow Doppler were utilized during procedure). Indications:    CHF  History:        Patient has prior history of Echocardiogram examinations, most                 recent 02/22/2024. CHF, CAD, COPD, Aortic Valve Disease,                 Signs/Symptoms:Shortness of Breath; Risk Factors:Hypertension                 and Dyslipidemia.  Sonographer:    Philomena Daring Referring Phys: ADRIANA DELENA GRAMS IMPRESSIONS  1. Left ventricular ejection fraction, by estimation, is 65 to 70%. The left ventricle has normal function. The left ventricle has  no regional wall motion abnormalities. Left ventricular diastolic parameters are consistent with Grade I  diastolic dysfunction (impaired relaxation).  2. Right ventricular systolic function is normal. The right ventricular size is normal. Tricuspid regurgitation signal is inadequate for assessing PA pressure.  3. The mitral valve is normal in structure. Trivial mitral valve regurgitation. No evidence of mitral stenosis.  4. The tricuspid valve is abnormal.  5. The aortic valve was not well visualized. There is moderate calcification of the aortic valve. There is moderate thickening of the aortic valve. Aortic valve regurgitation is mild. Moderate aortic valve stenosis. Aortic valve area, by VTI measures 1.31 cm. Aortic valve mean gradient measures 22.0 mmHg. Aortic valve Vmax measures 3.15 m/s.  6. The inferior vena cava is normal in size with greater than 50% respiratory variability, suggesting right atrial pressure of 3 mmHg. FINDINGS  Left Ventricle: Left ventricular ejection fraction, by estimation, is 65 to 70%. The left ventricle has normal function. The left ventricle has no regional wall motion abnormalities. The left ventricular internal cavity size was normal in size. There is  no left ventricular hypertrophy. Left ventricular diastolic parameters are consistent with Grade I diastolic dysfunction (impaired relaxation). Normal left ventricular filling pressure. Right Ventricle: The right ventricular size is normal. Right vetricular wall thickness was not well visualized. Right ventricular systolic function is normal. Tricuspid regurgitation signal is inadequate for assessing PA pressure. The tricuspid regurgitant velocity is 3.32 m/s, and with an assumed right atrial pressure of 3 mmHg, the estimated right ventricular systolic pressure is 47.1 mmHg. Left Atrium: Left atrial size was normal in size. Right Atrium: Right atrial size was normal in size. Pericardium: There is no evidence of pericardial  effusion. Mitral Valve: The mitral valve is normal in structure. There is mild thickening of the mitral valve leaflet(s). There is mild calcification of the mitral valve leaflet(s). Mild mitral annular calcification. Trivial mitral valve regurgitation. No evidence  of mitral valve stenosis. Tricuspid Valve: The tricuspid valve is abnormal. Tricuspid valve regurgitation is mild . No evidence of tricuspid stenosis. Aortic Valve: The aortic valve was not well visualized. There is moderate calcification of the aortic valve. There is moderate thickening of the aortic valve. There is moderate aortic valve annular calcification. Aortic valve regurgitation is mild. Moderate aortic stenosis is present. Aortic valve mean gradient measures 22.0 mmHg. Aortic valve peak gradient measures 39.7 mmHg. Aortic valve area, by VTI measures 1.31 cm. Pulmonic Valve: The pulmonic valve was not well visualized. Pulmonic valve regurgitation is not visualized. No evidence of pulmonic stenosis. Aorta: The aortic root and ascending aorta are structurally normal, with no evidence of dilitation. Venous: The inferior vena cava is normal in size with greater than 50% respiratory variability, suggesting right atrial pressure of 3 mmHg. IAS/Shunts: No atrial level shunt detected by color flow Doppler.  LEFT VENTRICLE PLAX 2D LVIDd:         4.40 cm   Diastology LVIDs:         3.00 cm   LV e' medial:    7.51 cm/s LV PW:         1.10 cm   LV E/e' medial:  13.1 LV IVS:        0.90 cm   LV e' lateral:   8.49 cm/s LVOT diam:     2.00 cm   LV E/e' lateral: 11.6 LV SV:         75 LV SV Index:   42 LVOT Area:     3.14 cm  RIGHT VENTRICLE  IVC RV Basal diam:  3.20 cm     IVC diam: 1.70 cm RV Mid diam:    2.40 cm RV S prime:     12.90 cm/s TAPSE (M-mode): 1.9 cm LEFT ATRIUM             Index        RIGHT ATRIUM           Index LA diam:        3.80 cm 2.13 cm/m   RA Area:     14.00 cm LA Vol (A2C):   49.7 ml 27.89 ml/m  RA Volume:   26.60 ml   14.93 ml/m LA Vol (A4C):   46.2 ml 25.93 ml/m LA Biplane Vol: 50.9 ml 28.57 ml/m  AORTIC VALVE AV Area (Vmax):    1.19 cm AV Area (Vmean):   1.14 cm AV Area (VTI):     1.31 cm AV Vmax:           315.00 cm/s AV Vmean:          217.000 cm/s AV VTI:            0.574 m AV Peak Grad:      39.7 mmHg AV Mean Grad:      22.0 mmHg LVOT Vmax:         119.00 cm/s LVOT Vmean:        78.850 cm/s LVOT VTI:          0.240 m LVOT/AV VTI ratio: 0.42  AORTA Ao Root diam: 2.50 cm Ao Asc diam:  2.80 cm MITRAL VALVE                TRICUSPID VALVE MV Area (PHT): 4.80 cm     TR Peak grad:   44.1 mmHg MV Decel Time: 158 msec     TR Vmax:        332.00 cm/s MV E velocity: 98.50 cm/s MV A velocity: 157.00 cm/s  SHUNTS MV E/A ratio:  0.63         Systemic VTI:  0.24 m                             Systemic Diam: 2.00 cm Dorn Ross MD Electronically signed by Dorn Ross MD Signature Date/Time: 07/30/2024/5:12:53 PM    Final    DG Chest 2 View Result Date: 07/30/2024 CLINICAL DATA:  Shortness of breath, rhonchi. EXAM: CHEST - 2 VIEW COMPARISON:  07/15/2024 and CT chest 07/16/2024. FINDINGS: Trachea is midline. Heart is enlarged. Thoracic aorta is calcified. 1.6 cm nodule in the superior segment left lower lobe. No airspace consolidation. Trace bilateral pleural effusions. Minimal streaky bibasilar atelectasis. Old right rib fractures.  Degenerative changes in the spine. IMPRESSION: 1. Tiny bilateral pleural effusions with minimal streaky bibasilar atelectasis. 2. Superior segment left lower lobe nodule, previously evaluated by PET 03/30/2024. Indolent adenocarcinoma cannot be excluded. Electronically Signed   By: Newell Eke M.D.   On: 07/30/2024 10:19   CT Angio Chest PE W and/or Wo Contrast Result Date: 07/16/2024 CLINICAL DATA:  Chest pain and shortness of breath. Back pain. Possible pulmonary embolism. EXAM: CT ANGIOGRAPHY CHEST WITH CONTRAST TECHNIQUE: Multidetector CT imaging of the chest was performed using the  standard protocol during bolus administration of intravenous contrast. Multiplanar CT image reconstructions and MIPs were obtained to evaluate the vascular anatomy. RADIATION DOSE REDUCTION: This exam was performed according to the departmental dose-optimization program which includes  automated exposure control, adjustment of the mA and/or kV according to patient size and/or use of iterative reconstruction technique. CONTRAST:  75mL OMNIPAQUE  IOHEXOL  350 MG/ML SOLN COMPARISON:  CTs 05/02/2024, 12/05/2023, 04/24/2021 and PET-CT 03/30/2024 FINDINGS: Cardiovascular: Cardiomegaly. Calcified plaque over the 3 vessel coronary arteries. Thoracic aorta is normal in caliber. Calcified plaque over the descending thoracic aorta. Pulmonary arterial system is well opacified without evidence of emboli. Remaining vascular structures are unremarkable. Mediastinum/Nodes: No significant mediastinal or hilar adenopathy. Remaining mediastinal structures are normal. Lungs/Pleura: Lungs are adequately inflated. Moderate centrilobular emphysematous disease is present. Left lower lobe nodule measuring 1.2 x 1.6 cm unchanged from 12/05/2023 although increased in size compared to 2022. This demonstrates no significant hypermetabolic activity on previous PET-CT. Minimal nodularity in the posterior subpleural location over the left upper lobe likely scarring. Minimal ill-defined focal opacification of the posteromedial right lower lobe likely atelectasis although cannot exclude early infection. No effusion. Airways are unremarkable. Upper Abdomen: Calcified plaque over the visualized upper abdominal aorta. Previous cholecystectomy. Upper abdomen otherwise unchanged. No acute findings. Musculoskeletal: Subtle stable loss of vertebral body height of 2 adjacent thoracic vertebrae. Review of the MIP images confirms the above findings. IMPRESSION: 1. No evidence of pulmonary embolism. 2. Minimal ill-defined focal opacification of the posteromedial  right lower lobe likely atelectasis, although cannot exclude early infection. 3. 1.2 x 1.6 cm left lower lobe nodule unchanged from 12/05/2023, although increased in size compared to 2022. This demonstrates no significant hypermetabolic activity on previous PET-CT. Agree with prior recommendations of continued CT surveillance 6-12 months. 4. Emphysema. 5. Aortic atherosclerosis. Atherosclerotic coronary artery disease. Aortic Atherosclerosis (ICD10-I70.0) and Emphysema (ICD10-J43.9). Electronically Signed   By: Toribio Agreste M.D.   On: 07/16/2024 12:55   DG Chest 2 View Result Date: 07/15/2024 CLINICAL DATA:  Chest pain EXAM: CHEST - 2 VIEW COMPARISON:  05/02/2024, PET CT 03/30/2024 FINDINGS: Cardiomegaly with small pleural effusions. Diffuse bronchitic changes. Old right-sided rib fractures. Emphysema. Left mid lung/lower lobe pulmonary nodule measuring 17 mm radiographically. IMPRESSION: 1. Cardiomegaly with small pleural effusions. 2. Emphysema with diffuse bronchitic changes. 3. Left mid lung/lower lobe pulmonary nodule measuring 17 mm radiographically. Reference chest CT from September and PET CT from August Electronically Signed   By: Luke Bun M.D.   On: 07/15/2024 21:39     Subjective: No acute issues or events overnight denies nausea vomit diarrhea constipation headache fevers chills or chest pain, shortness of breath markedly improved   Discharge Exam: Vitals:   08/02/24 1100 08/02/24 1137  BP:  (!) 141/77  Pulse:    Resp:    Temp: 97.6 F (36.4 C) 97.6 F (36.4 C)  SpO2:     Vitals:   08/02/24 0716 08/02/24 0746 08/02/24 1100 08/02/24 1137  BP: (!) 158/80   (!) 141/77  Pulse:      Resp: 18     Temp: 97.6 F (36.4 C)  97.6 F (36.4 C) 97.6 F (36.4 C)  TempSrc: Oral  Oral Oral  SpO2: 96% 91%    Weight:      Height:        General: Pt is alert, awake, not in acute distress Cardiovascular: RRR, S1/S2 +, no rubs, no gallops Respiratory: Diminished bilaterally without  overt wheeze rales or rhonchi Abdominal: Soft, NT, ND, bowel sounds + Extremities: no edema, no cyanosis    The results of significant diagnostics from this hospitalization (including imaging, microbiology, ancillary and laboratory) are listed below for reference.     Microbiology: Recent  Results (from the past 240 hours)  Resp panel by RT-PCR (RSV, Flu A&B, Covid) Anterior Nasal Swab     Status: Abnormal   Collection Time: 07/30/24  8:31 AM   Specimen: Anterior Nasal Swab  Result Value Ref Range Status   SARS Coronavirus 2 by RT PCR NEGATIVE NEGATIVE Final   Influenza A by PCR POSITIVE (A) NEGATIVE Final   Influenza B by PCR NEGATIVE NEGATIVE Final    Comment: (NOTE) The Xpert Xpress SARS-CoV-2/FLU/RSV plus assay is intended as an aid in the diagnosis of influenza from Nasopharyngeal swab specimens and should not be used as a sole basis for treatment. Nasal washings and aspirates are unacceptable for Xpert Xpress SARS-CoV-2/FLU/RSV testing.  Fact Sheet for Patients: bloggercourse.com  Fact Sheet for Healthcare Providers: seriousbroker.it  This test is not yet approved or cleared by the [place]  FDA and has been authorized for detection and/or diagnosis of SARS-CoV-2 by FDA under an Emergency Use Authorization (EUA). This EUA will remain in effect (meaning this test can be used) for the duration of the COVID-19 declaration under Section 564(b)(1) of the Act, 21 U.S.C. section 360bbb-3(b)(1), unless the authorization is terminated or revoked.     Resp Syncytial Virus by PCR NEGATIVE NEGATIVE Final    Comment: (NOTE) Fact Sheet for Patients: bloggercourse.com  Fact Sheet for Healthcare Providers: seriousbroker.it  This test is not yet approved or cleared by the [place]  FDA and has been authorized for detection and/or diagnosis of SARS-CoV-2 by FDA under an  Emergency Use Authorization (EUA). This EUA will remain in effect (meaning this test can be used) for the duration of the COVID-19 declaration under Section 564(b)(1) of the Act, 21 U.S.C. section 360bbb-3(b)(1), unless the authorization is terminated or revoked.  Performed at [facility], [address], [place], [place] [address]      Labs: BNP (last 3 results) Recent Labs    02/18/24 0014 02/22/24 0600 02/23/24 0650  BNP 256.1* 202.0* 132.4*   Basic Metabolic Panel: Recent Labs  Lab 07/30/24 0830 07/30/24 0856 07/30/24 0946 07/30/24 1055 07/31/24 0748 08/01/24 0356 08/02/24 0244  NA 120* 120*  120*  --  121* 125* 125* 129*  K 4.1 4.0  4.0  --   --  4.1 4.0 4.4  CL 83* 84*  --   --  88* 88* 91*  CO2 26  --   --   --  26 28 28   GLUCOSE 110* 113*  --   --  126* 116* 114*  BUN <5* <3*  --   --  15 24* 19  CREATININE 0.38* 0.40*  --   --  0.67 0.72 0.61  CALCIUM  8.7*  --   --   --  9.3 9.3 9.4  MG  --   --  1.6* 1.7  --   --   --   PHOS  --   --   --  2.6  --   --   --    Liver Function Tests: Recent Labs  Lab 07/30/24 0830  AST 171*  ALT 126*  ALKPHOS 251*  BILITOT 0.4  PROT 7.3  ALBUMIN  4.3   Recent Labs  Lab 07/30/24 0946  LIPASE 16   No results for input(s): AMMONIA in the last 168 hours. CBC: Recent Labs  Lab 07/30/24 0830 07/30/24 0856  WBC 7.0  --   NEUTROABS 6.2  --   HGB 13.6 16.7*  16.7*  HCT 43.9 49.0*  49.0*  MCV  91.8  --   PLT 273  --    Cardiac Enzymes: No results for input(s): CKTOTAL, CKMB, CKMBINDEX, TROPONINI in the last 168 hours. BNP: Invalid input(s): POCBNP CBG: Recent Labs  Lab 07/30/24 2039 08/02/24 0609  GLUCAP 140* 127*   D-Dimer No results for input(s): DDIMER in the last 72 hours. Hgb A1c No results for input(s): HGBA1C in the last 72 hours. Lipid Profile No results for input(s): CHOL, HDL, LDLCALC, TRIG, CHOLHDL, LDLDIRECT in the last 72 hours. Thyroid  function  studies No results for input(s): TSH, T4TOTAL, T3FREE, THYROIDAB in the last 72 hours.  Invalid input(s): FREET3 Anemia work up No results for input(s): VITAMINB12, FOLATE, FERRITIN, TIBC, IRON , RETICCTPCT in the last 72 hours. Urinalysis    Component Value Date/Time   COLORURINE YELLOW 07/30/2024 0826   APPEARANCEUR CLEAR 07/30/2024 0826   LABSPEC 1.011 07/30/2024 0826   PHURINE 7.0 07/30/2024 0826   GLUCOSEU NEGATIVE 07/30/2024 0826   HGBUR NEGATIVE 07/30/2024 0826   BILIRUBINUR NEGATIVE 07/30/2024 0826   BILIRUBINUR neg 12/27/2022 0955   KETONESUR NEGATIVE 07/30/2024 0826   PROTEINUR 30 (A) 07/30/2024 0826   UROBILINOGEN 0.2 12/27/2022 0955   UROBILINOGEN 0.2 03/29/2012 2003   NITRITE NEGATIVE 07/30/2024 0826   LEUKOCYTESUR NEGATIVE 07/30/2024 0826   Sepsis Labs Recent Labs  Lab 07/30/24 0830  WBC 7.0   Microbiology Recent Results (from the past 240 hours)  Resp panel by RT-PCR (RSV, Flu A&B, Covid) Anterior Nasal Swab     Status: Abnormal   Collection Time: 07/30/24  8:31 AM   Specimen: Anterior Nasal Swab  Result Value Ref Range Status   SARS Coronavirus 2 by RT PCR NEGATIVE NEGATIVE Final   Influenza A by PCR POSITIVE (A) NEGATIVE Final   Influenza B by PCR NEGATIVE NEGATIVE Final    Comment: (NOTE) The Xpert Xpress SARS-CoV-2/FLU/RSV plus assay is intended as an aid in the diagnosis of influenza from Nasopharyngeal swab specimens and should not be used as a sole basis for treatment. Nasal washings and aspirates are unacceptable for Xpert Xpress SARS-CoV-2/FLU/RSV testing.  Fact Sheet for Patients: bloggercourse.com  Fact Sheet for Healthcare Providers: seriousbroker.it  This test is not yet approved or cleared by the [place]  FDA and has been authorized for detection and/or diagnosis of SARS-CoV-2 by FDA under an Emergency Use Authorization (EUA). This EUA will remain in effect  (meaning this test can be used) for the duration of the COVID-19 declaration under Section 564(b)(1) of the Act, 21 U.S.C. section 360bbb-3(b)(1), unless the authorization is terminated or revoked.     Resp Syncytial Virus by PCR NEGATIVE NEGATIVE Final    Comment: (NOTE) Fact Sheet for Patients: bloggercourse.com  Fact Sheet for Healthcare Providers: seriousbroker.it  This test is not yet approved or cleared by the [place]  FDA and has been authorized for detection and/or diagnosis of SARS-CoV-2 by FDA under an Emergency Use Authorization (EUA). This EUA will remain in effect (meaning this test can be used) for the duration of the COVID-19 declaration under Section 564(b)(1) of the Act, 21 U.S.C. section 360bbb-3(b)(1), unless the authorization is terminated or revoked.  Performed at [facility], [address] [place], [place] [address]      Time coordinating discharge: Over 30 minutes  SIGNED:   Elsie JAYSON Montclair, DO [facility] 08/02/2024, 12:56 PM Pager   If 7PM-7AM, please contact night-coverage [email or web address]     [1] No Known Allergies

## 2024-08-02 NOTE — Progress Notes (Signed)
 Mobility Specialist Progress Note:    08/02/24 1000  Oxygen Therapy  O2 Device Nasal Cannula  O2 Flow Rate (L/min) 2 L/min  Pulse Oximetry Type Continuous  Oximetry Probe Site Changed No  Mobility  Activity Ambulated with assistance  Level of Assistance Standby assist, set-up cues, supervision of patient - no hands on  Assistive Device None  Distance Ambulated (ft) 220 ft  Activity Response Tolerated well  Mobility Referral Yes  Mobility visit 1 Mobility  Mobility Specialist Start Time (ACUTE ONLY) 0945  Mobility Specialist Stop Time (ACUTE ONLY) 1000  Mobility Specialist Time Calculation (min) (ACUTE ONLY) 15 min   Pt received in bed agreeable to mobility. Attempted to get SPO2 WFL at rest on RA but d/t dropping to 84% O2 flow increased to 2L/min. Ambulated on 2L/min VSS. No c/o throughout returned to room w/o fault. Left in bed w/ call bell and personal belongings in reach. All needs met.   Thersia Minder Mobility Specialist  Please contact vis Secure Chat or  Rehab Office [phone]

## 2024-08-02 NOTE — TOC Transition Note (Signed)
 Transition of Care [facility]) - Discharge Note   Patient Details  Name: Denise [name] MRN: [id number] Date of Birth: 1960/10/23  Transition of [facility]) CM/SW Contact:  Marval Gell, RN Phone Number: 08/02/2024, 1:05 PM   Clinical Narrative:     Patient will DC to home, has order for home oxygen, called in to [employer] to deliver to bedside. No other ICM needs identified   Final next level of care: Home/Self Care Barriers to Discharge: No Barriers Identified   Patient Goals and CMS Choice            Discharge Placement                       Discharge Plan and Services Additional resources added to the After Visit Summary for                  DME Arranged: Oxygen DME Agency: [employer] Date DME Agency Contacted: 08/02/24 Time DME Agency Contacted: [phone] Representative spoke with at DME Agency: [place]            Social Drivers of Health (SDOH) Interventions SDOH Screenings   Food Insecurity: No Food Insecurity (07/30/2024)  Housing: High Risk (07/30/2024)  Transportation Needs: No Transportation Needs (07/30/2024)  Recent Concern: Transportation Needs - Unmet Transportation Needs (05/02/2024)  Utilities: Not At Risk (07/30/2024)  Alcohol Screen: Low Risk (01/28/2024)  Depression (PHQ2-9): Medium Risk (07/21/2024)  Financial Resource Strain: Patient Declined (01/28/2024)  Physical Activity: Insufficiently Active (01/28/2024)  Social Connections: Moderately Integrated (07/30/2024)  Recent Concern: Social Connections - Moderately Isolated (05/02/2024)  Stress: Stress Concern Present (01/28/2024)  Tobacco Use: Medium Risk (07/30/2024)     Readmission Risk Interventions    05/04/2024    9:39 AM 02/13/2024    3:05 PM  Readmission Risk Prevention Plan  Post Dischage Appt  Complete  Medication Screening  Complete  Transportation Screening Complete Complete  PCP or Specialist Appt within 3-5 Days Complete   HRI or Home Care Consult Complete    Social Work Consult for Recovery Care Planning/Counseling Complete   Palliative Care Screening Not Applicable   Medication Review Oceanographer) Referral to Pharmacy

## 2024-08-02 NOTE — Progress Notes (Signed)
 Mobility Specialist Progress Note:  Nurse requested Mobility Specialist to perform oxygen saturation test with pt which includes removing pt from oxygen both at rest and while ambulating.  Below are the results from that testing.     Patient Saturations on Room Air at Rest = spO2 84% O2 flow increased to 2L/min to keep SPO2 above 88%  Patient Saturations on Room Air while Ambulating = sp02 N/A% .  Patient Saturations on 2 Liters of oxygen while Ambulating = sp02 90%  At end of testing pt left in room on 2  Liters of oxygen.  Reported results to nurse.    Thersia Minder Mobility Specialist  Please contact vis Secure Chat or  Rehab Office [phone]

## 2024-08-02 NOTE — Progress Notes (Signed)
 Discharge   Patient expressed verbal understanding of discharge POC.   Patient given time to ask any questions.  Additional education included in AVS.  Alert oriented in good spirits.   Tele and PIV removed.  Pressure dressings intact.  CCMD/Ashley  maintained on 2 Liters [place].  No sob or discomfort.   Oxygen tank for home at bedside.  TOC Meds at bedside.  Ride on the way.

## 2024-08-03 ENCOUNTER — Telehealth: Payer: Self-pay

## 2024-08-03 NOTE — Transitions of Care (Post Inpatient/ED Visit) (Signed)
" ° °  08/03/2024  Name: Arpi Diebold MRN: [id number] DOB: 1961-07-12  Today's TOC FU Call Status: Today's TOC FU Call Status:: Unsuccessful Call (1st Attempt) Unsuccessful Call (1st Attempt) Date: 08/03/24  Attempted to reach the patient regarding the most recent Inpatient/ED visit.  Spoke with daughter, Tinnie on [place], but she did not wish to talk or give HIPAA and who states patient unable to talk right now and can call back tomorrow.  Follow Up Plan: Additional outreach attempts will be made to reach the patient to complete the Transitions of Care (Post Inpatient/ED visit) call.   Richerd Fish, RN, BSN, CCM [facility], [facility] Management Coordinator Direct Dial: [phone]        "

## 2024-08-04 ENCOUNTER — Telehealth: Payer: Self-pay

## 2024-08-04 NOTE — Transitions of Care (Post Inpatient/ED Visit) (Addendum)
 "  08/04/2024  Name: Denise [name] MRN: [id number] DOB: 1960/11/03  Today's TOC FU Call Status: Today's TOC FU Call Status:: Successful TOC FU Call Completed TOC FU Call Complete Date: 08/04/24  Patient's Name and Date of Birth confirmed. Name, DOB  Transition Care Management Follow-up Telephone Call Date of Discharge: 08/02/24 Discharge Facility: [facility]) Type of Discharge: Inpatient Admission Primary Inpatient Discharge Diagnosis:: Acute Respiratory Failure with hypoxia; Influ A How have you been since you were released from the hospital?: Same (Still a little short of breath) Any questions or concerns?: No  Items Reviewed: Did you receive and understand the discharge instructions provided?: Yes Medications obtained,verified, and reconciled?: Yes (Medications Reviewed) Any new allergies since your discharge?: No Dietary orders reviewed?: Yes Type of Diet Ordered:: Heart Healthy Do you have support at home?: Yes People in Home [RPT]: child(ren), adult Name of Support/Comfort Primary Source: Daughter Lauren  Medications Reviewed Today: Medications Reviewed Today     Reviewed by Eilleen Richerd GRADE, RN (Registered Nurse) on 08/04/24 at 1505  Med List Status: <None>   Medication Order Taking? Sig Documenting Provider Last Dose Status Informant  acetaminophen  (TYLENOL ) 500 MG tablet [id number] Yes Take 2 tablets (1,000 mg total) by mouth every 8 (eight) hours as needed. Maczis, Michael M, PA-C  Active Self, Pharmacy Records  albuterol  (VENTOLIN  HFA) 108 [phone]) MCG/ACT inhaler [id number] Yes INHALE 2 PUFFS INTO THE LUNGS EVERY 4-6 HOURS AS NEEDED FOR TIGHTNESS/WHEEZING Clark, Katherine K, NP  Active Self, Pharmacy Records  amLODipine  (NORVASC ) 10 MG tablet [id number] Yes Take 1 tablet (10 mg total) by mouth daily. West, Katlyn D, NP  Active Self, Pharmacy Records  apixaban  (ELIQUIS ) 5 MG TABS tablet [id number] Yes Take 1 tablet (5 mg total) by mouth 2 (two) times daily.  West, Katlyn D, NP  Active Self, Pharmacy Records  Ascorbic Acid  (VITAMIN C ) 1000 MG tablet [id number] Yes Take 2,000 mg by mouth daily. [provider]  Active Self, Pharmacy Records  atorvastatin  (LIPITOR ) 80 MG tablet [id number] Yes TAKE 1 TABLET (80 MG TOTAL) BY MOUTH DAILY FOR CHOLESTEROL Clark, Katherine K, NP  Active Self, Pharmacy Records  busPIRone  (BUSPAR ) 10 MG tablet [id number] Yes TAKE 1 TABLET (10 MG TOTAL) BY MOUTH 2 (TWO) TIMES DAILY. FOR ANXIETY Gretta Comer POUR, NP  Active Self, Pharmacy Records  clopidogrel  (PLAVIX ) 75 MG tablet [id number] Yes Take 75 mg by mouth daily. [provider]  Active Self, Pharmacy Records  escitalopram  (LEXAPRO ) 20 MG tablet [id number] Yes TAKE 1 TABLET BY MOUTH DAILY FOR ANXIETY Gretta Comer POUR, NP  Active Self, Pharmacy Records  ferrous sulfate  325 (65 FE) MG tablet [id number] Yes Take 1 tablet (325 mg total) by mouth 2 (two) times daily. For iron  Clark, Katherine K, NP  Active Self, Pharmacy Records  fluticasone -salmeterol [facility] INHUB) 250-50 MCG/ACT AEPB [id number] Yes Inhale 1 puff into the lungs in the morning and at bedtime. Isadora Hose, MD  Active Self, Pharmacy Records  furosemide  (LASIX ) 20 MG tablet [id number] Yes Take 1 tablet (20 mg total) by mouth daily. Lue Elsie BROCKS, MD  Active   metoprolol  tartrate (LOPRESSOR ) 50 MG tablet [id number] Yes Take 1 tablet (50 mg total) by mouth 2 (two) times daily. West, Katlyn D, NP  Active Self, Pharmacy Records  oseltamivir  (TAMIFLU ) 75 MG capsule [id number] Yes Take 1 capsule (75 mg total) by mouth 2 (two) times daily. Lue Elsie BROCKS, MD  Active   pantoprazole  (PROTONIX ) 40 MG tablet [id number] Yes TAKE 1  TABLET (40 MG TOTAL) BY MOUTH DAILY. FOR HEARTBURN Clark, Katherine K, NP  Active Self, Pharmacy Records  predniSONE  (DELTASONE ) 10 MG tablet [id number] Yes Take 4 tablets (40 mg total) by mouth daily for 3 days, THEN 3 tablets (30 mg total) daily for 3 days, THEN 2 tablets (20  mg total) daily for 3 days, THEN 1 tablet (10 mg total) daily for 3 days. Lue Elsie BROCKS, MD  Active   pyridoxine  (B-6) 100 MG tablet [id number] Yes Take 100 mg by mouth daily. [provider]  Active Self, Pharmacy Records  sodium chloride  1 g tablet [id number] Yes TAKE 1 TABLET (1 G TOTAL) BY MOUTH 2 (TWO) TIMES DAILY WITH A MEAL. TO INCREASE SODIUM Clark, Katherine K, NP  Active Self, Pharmacy Records  vitamin B-12 (CYANOCOBALAMIN ) 1000 MCG tablet [id number] Yes Take 1,000 mcg by mouth daily. [provider]  Active Self, Pharmacy Records  vitamin E  180 MG (400 UNITS) capsule [id number] Yes Take 400 Units by mouth daily. [provider]  Active Self, Pharmacy Records            Home Care and Equipment/Supplies: Were Home Health Services Ordered?: NA Any new equipment or medical supplies ordered?: Yes Were you able to get the equipment/medical supplies?: Yes Do you have any questions related to the use of the equipment/supplies?: No  Functional Questionnaire: Do you need assistance with bathing/showering or dressing?: No Do you need assistance with meal preparation?: No (Just taking everything slow because I have to get use to this oxygen and tubing in my house) Do you need assistance with eating?: No Do you have difficulty maintaining continence: No Do you need assistance with getting out of bed/getting out of a chair/moving?: No Do you have difficulty managing or taking your medications?: No  Follow up appointments reviewed: PCP Follow-up appointment confirmed?: No (Appointment made for patient with PCP made for 08/11/24 at 12:20 for HFU with Comer Gaskins through [facility]) Specialist [facility] Follow-up appointment confirmed?: Yes Date of Specialist follow-up appointment?: 10/06/24 Follow-Up Specialty Provider:: [facility] with Lonni Slain Do you need transportation to your follow-up appointment?: No Do you understand care  options if your condition(s) worsen?: Yes-patient verbalized understanding  SDOH Interventions Today    Flowsheet Row Most Recent Value  SDOH Interventions   Housing Interventions Intervention Not Indicated  Transportation Interventions Intervention Not Indicated, Patient Resources (Friends/Family)   Education for self-mgmt of Acute respiratory failure with oxygen provided during this outreach regarding: -s/s of worsening condition and when to seek medical attention -importance of completing all post discharge hospital hospital follow up appts -adherence to med regimen [facility] TOC 30-day program enrollment reviewed and discussed with pt/caregiver. They have declined enrollment in 30 day TOC program due to: doesn't feel like she need it   Richerd Fish, RN, BSN, CCM [facility], [facility] Management Coordinator Direct Dial: [phone]        "

## 2024-08-05 DIAGNOSIS — E871 Hypo-osmolality and hyponatremia: Secondary | ICD-10-CM

## 2024-08-11 ENCOUNTER — Ambulatory Visit: Payer: Self-pay | Admitting: Primary Care

## 2024-08-11 ENCOUNTER — Ambulatory Visit: Admitting: Primary Care

## 2024-08-11 ENCOUNTER — Encounter: Payer: Self-pay | Admitting: Primary Care

## 2024-08-11 ENCOUNTER — Ambulatory Visit
Admission: RE | Admit: 2024-08-11 | Discharge: 2024-08-11 | Disposition: A | Source: Ambulatory Visit | Attending: Primary Care

## 2024-08-11 VITALS — BP 128/66 | HR 71 | Temp 98.8°F | Ht 63.0 in | Wt 159.1 lb

## 2024-08-11 DIAGNOSIS — F32A Depression, unspecified: Secondary | ICD-10-CM | POA: Diagnosis not present

## 2024-08-11 DIAGNOSIS — I2510 Atherosclerotic heart disease of native coronary artery without angina pectoris: Secondary | ICD-10-CM

## 2024-08-11 DIAGNOSIS — J9601 Acute respiratory failure with hypoxia: Secondary | ICD-10-CM | POA: Diagnosis not present

## 2024-08-11 DIAGNOSIS — J09X1 Influenza due to identified novel influenza A virus with pneumonia: Secondary | ICD-10-CM | POA: Diagnosis not present

## 2024-08-11 DIAGNOSIS — J449 Chronic obstructive pulmonary disease, unspecified: Secondary | ICD-10-CM | POA: Diagnosis not present

## 2024-08-11 DIAGNOSIS — F419 Anxiety disorder, unspecified: Secondary | ICD-10-CM

## 2024-08-11 DIAGNOSIS — I6523 Occlusion and stenosis of bilateral carotid arteries: Secondary | ICD-10-CM

## 2024-08-11 DIAGNOSIS — I517 Cardiomegaly: Secondary | ICD-10-CM

## 2024-08-11 DIAGNOSIS — E222 Syndrome of inappropriate secretion of antidiuretic hormone: Secondary | ICD-10-CM | POA: Diagnosis not present

## 2024-08-11 DIAGNOSIS — I4892 Unspecified atrial flutter: Secondary | ICD-10-CM | POA: Diagnosis not present

## 2024-08-11 DIAGNOSIS — I10 Essential (primary) hypertension: Secondary | ICD-10-CM

## 2024-08-11 DIAGNOSIS — S2241XA Multiple fractures of ribs, right side, initial encounter for closed fracture: Secondary | ICD-10-CM

## 2024-08-11 DIAGNOSIS — I480 Paroxysmal atrial fibrillation: Secondary | ICD-10-CM

## 2024-08-11 DIAGNOSIS — E871 Hypo-osmolality and hyponatremia: Secondary | ICD-10-CM

## 2024-08-11 LAB — BASIC METABOLIC PANEL WITH GFR
BUN: 12 mg/dL (ref 6–23)
CO2: 30 meq/L (ref 19–32)
Calcium: 8.9 mg/dL (ref 8.4–10.5)
Chloride: 98 meq/L (ref 96–112)
Creatinine, Ser: 0.72 mg/dL (ref 0.40–1.20)
GFR: 89.25 mL/min
Glucose, Bld: 97 mg/dL (ref 70–99)
Potassium: 3.8 meq/L (ref 3.5–5.1)
Sodium: 134 meq/L — ABNORMAL LOW (ref 135–145)

## 2024-08-11 NOTE — Progress Notes (Signed)
 "  Subjective:    Patient ID: Denise [name], female    DOB: 1960/10/30, 64 y.o.   MRN: [id number]  Denise [name] is a very pleasant 64 y.o. female with significant medical history including hypertension, cardiomegaly, AVM of colon, CAD, carotid artery atherosclerosis, paroxysmal atrial fibrillation, COPD, SIADH, osteoarthritis, seizure who presents today for hospital follow-up.  She presented to [facility] ED on 07/30/2024 for a 3 day history of progressing shortness of breath with exertion, laying down,.  Also with bilateral chest pain.  Workup in the ED revealed hypoxia with room air saturation of 86% requiring supplemental oxygen, low-grade fever, sodium of 120, BNP of 1053.  Chest x-ray showed small bilateral effusions and left lobe nodule. She tested positive for influenza A. She was admitted for further evaluation.  During her hospital stay she was treated with DuoNeb bronchodilators, IV steroids, Tamiflu , IV Lasix .  She was discharged home on 08/02/2024 with recommendations to discuss polypharmacy secondary to hyponatremia and general follow-up.  Since her hospital stay she's feeling slightly better.. She uses her oxygen only with rest as she's afraid to drag the oxygen tubing around the house. Her dyspnea has improved. She continues to take furosemide  daily and questions if she needs to discontinue and switch back to lisinopril . She doesn't feel any different on Lexapro  for anxiety. She began experiencing pain to the left lower lobe of her lungs yesterday which is how she felt when she was developing pneumonia. She correlates this to tapering down to her prednisone .     BP Readings from Last 3 Encounters:  08/11/24 128/66  08/02/24 (!) 111/59  07/21/24 136/66      Review of Systems  Constitutional:  Negative for chills and fever.  HENT:  Positive for congestion.   Respiratory:  Positive for cough and shortness of breath.   Cardiovascular:  Negative for chest pain.  Neurological:   Negative for dizziness.  Psychiatric/Behavioral:  The patient is nervous/anxious.          Past Medical History:  Diagnosis Date   Allergy    Anemia 12/24/2019   Anxiety    Aortic insufficiency    Aortic stenosis    Arthritis    Carotid artery disease    Cataract 12/25/2019   Centrilobular emphysema (HCC) 11/2023   Chickenpox    Cholecystitis with cholelithiasis 03/31/2012   Closed fracture of L4 transverse process s/p traumatic mechanical fall 10/26/2023   COPD (chronic obstructive pulmonary disease) (HCC)    Coronary artery disease    Depression    Diastolic dysfunction    Dyspnea    with exertion   Emphysema of lung (HCC) not sure   GERD (gastroesophageal reflux disease)    Headache    Heart murmur    High anion gap metabolic acidosis 10/26/2023   History of blood transfusion 2021   HLD (hyperlipidemia)    Hypertension 03/31/2012   Hyponatremia in setting of SIADH    Left lower lobe pulmonary nodule 11/2023   Long-term use of aspirin  therapy    Lumbar compression fracture s/p traumatic mechanical fall; L4 superior endplate    Pneumonia    x 2   PVD (peripheral vascular disease)    Shingles    SIADH (syndrome of inappropriate ADH production)    Traumatic mechanical fall 10/26/2023   a.) s/p traumatic fall 10/26/2023 reuslting in multiple RIGHT rib (5th - 7th) fractures (required dchest tube), l$ transverse process fracture, L4 superior endplate compression fracture, large volume post-traumatic myofacial/subcutaneous gas collection  in chest    Social History   Socioeconomic History   Marital status: Widowed    Spouse name: Not on file   Number of children: 2   Years of education: Not on file   Highest education level: 12th grade  Occupational History   Occupation: Clinical Biochemist Rep  Tobacco Use   Smoking status: Former    Current packs/day: 0.00    Average packs/day: 0.8 packs/day for 38.0 years (28.5 ttl pk-yrs)    Types: Cigarettes    Start date:  05/06/1985    Quit date: 05/07/2023    Years since quitting: 1.2   Smokeless tobacco: Never  Vaping Use   Vaping status: Never Used  Substance and Sexual Activity   Alcohol use: Yes    Alcohol/week: 1.0 standard drink of alcohol    Types: 1 Standard drinks or equivalent per week    Comment: occasionally   Drug use: No   Sexual activity: Not Currently    Birth control/protection: Post-menopausal  Other Topics Concern   Not on file  Social History Narrative   Widow.    2 children.   Works in Clinical Biochemist. Currently on disability as of 04/2023.   Enjoys reading, cooking.    Social Drivers of Health   Tobacco Use: Medium Risk (08/11/2024)   Patient History    Smoking Tobacco Use: Former    Smokeless Tobacco Use: Never    Passive Exposure: Not on file  Financial Resource Strain: Patient Declined (01/28/2024)   Overall Financial Resource Strain (CARDIA)    Difficulty of Paying Living Expenses: Patient declined  Food Insecurity: No Food Insecurity (08/04/2024)   Epic    Worried About Programme Researcher, Broadcasting/film/video in the Last Year: Never true    Ran Out of Food in the Last Year: Never true  Transportation Needs: No Transportation Needs (08/04/2024)   Epic    Lack of Transportation (Medical): No    Lack of Transportation (Non-Medical): No  Physical Activity: Insufficiently Active (01/28/2024)   Exercise Vital Sign    Days of Exercise per Week: 4 days    Minutes of Exercise per Session: 20 min  Stress: Stress Concern Present (01/28/2024)   [name] of Occupational Health - Occupational Stress Questionnaire    Feeling of Stress: Very much  Social Connections: Moderately Integrated (07/30/2024)   Social Connection and Isolation Panel    Frequency of Communication with Friends and Family: More than three times a week    Frequency of Social Gatherings with Friends and Family: More than three times a week    Attends Religious Services: 1 to 4 times per year    Active Member of [employer]  or Organizations: Yes    Attends Banker Meetings: 1 to 4 times per year    Marital Status: Widowed  Recent Concern: Social Connections - Moderately Isolated (05/02/2024)   Social Connection and Isolation Panel    Frequency of Communication with Friends and Family: More than three times a week    Frequency of Social Gatherings with Friends and Family: More than three times a week    Attends Religious Services: 1 to 4 times per year    Active Member of [employer] or Organizations: No    Attends Banker Meetings: Never    Marital Status: Widowed  Intimate Partner Violence: Not At Risk (08/04/2024)   Epic    Fear of Current or Ex-Partner: No    Emotionally Abused: No    Physically Abused:  No    Sexually Abused: No  Depression (PHQ2-9): High Risk (08/11/2024)   Depression (PHQ2-9)    PHQ-2 Score: 12  Alcohol Screen: Low Risk (01/28/2024)   Alcohol Screen    Last Alcohol Screening Score (AUDIT): 2  Housing: Low Risk (08/04/2024)   Epic    Unable to Pay for Housing in the Last Year: No    Number of Times Moved in the Last Year: 0    Homeless in the Last Year: No  Recent Concern: Housing - High Risk (07/30/2024)   Epic    Unable to Pay for Housing in the Last Year: Yes    Number of Times Moved in the Last Year: 0    Homeless in the Last Year: No  Utilities: Not At Risk (07/30/2024)   Epic    Threatened with loss of utilities: No  Health Literacy: Not on file    Past Surgical History:  Procedure Laterality Date   BIOPSY  12/24/2019   Procedure: BIOPSY;  Surgeon: Teressa Toribio SQUIBB, MD;  Location: [facility] ENDOSCOPY;  Service: Endoscopy;;   BRONCHIAL BIOPSY  12/19/2023   Procedure: BRONCHOSCOPY, WITH BIOPSY;  Surgeon: Isadora Hose, MD;  Location: [facility] ENDOSCOPY;  Service: Pulmonary;;   BRONCHIAL BRUSHINGS  12/19/2023   Procedure: BRONCHOSCOPY, WITH BRUSH BIOPSY;  Surgeon: Isadora Hose, MD;  Location: [facility] ENDOSCOPY;  Service: Pulmonary;;   BRONCHIAL NEEDLE ASPIRATION  BIOPSY  12/19/2023   Procedure: BRONCHOSCOPY, WITH NEEDLE ASPIRATION BIOPSY;  Surgeon: Isadora Hose, MD;  Location: [facility] ENDOSCOPY;  Service: Pulmonary;;   BRONCHIAL WASHINGS  12/19/2023   Procedure: IRRIGATION, BRONCHUS;  Surgeon: Isadora Hose, MD;  Location: [facility] ENDOSCOPY;  Service: Pulmonary;;   BRONCHOSCOPY, WITH BIOPSY USING ELECTROMAGNETIC NAVIGATION Bilateral 12/19/2023   Procedure: ROBOTIC ASSISTED NAVIGATIONAL BRONCHOSCOPY;  Surgeon: Isadora Hose, MD;  Location: [facility] ENDOSCOPY;  Service: Pulmonary;  Laterality: Bilateral;   CATARACT EXTRACTION Bilateral 2021   CHOLECYSTECTOMY  03/30/2012   Procedure: LAPAROSCOPIC CHOLECYSTECTOMY WITH INTRAOPERATIVE CHOLANGIOGRAM;  Surgeon: Morene ONEIDA Olives, MD;  Location: [facility] ORS;  Service: General;  Laterality: N/A;   COLONOSCOPY N/A 05/05/2024   Procedure: COLONOSCOPY;  Surgeon: San Sandor GAILS, DO;  Location: [facility] ENDOSCOPY;  Service: Gastroenterology;  Laterality: N/A;   COLONOSCOPY WITH PROPOFOL  N/A 12/24/2019   Procedure: COLONOSCOPY WITH PROPOFOL ;  Surgeon: Teressa Toribio SQUIBB, MD;  Location: [facility] ENDOSCOPY;  Service: Endoscopy;  Laterality: N/A;   ESOPHAGOGASTRODUODENOSCOPY N/A 05/05/2024   Procedure: EGD (ESOPHAGOGASTRODUODENOSCOPY);  Surgeon: San Sandor GAILS, DO;  Location: [facility] ENDOSCOPY;  Service: Gastroenterology;  Laterality: N/A;   ESOPHAGOGASTRODUODENOSCOPY (EGD) WITH PROPOFOL  N/A 12/24/2019   Procedure: ESOPHAGOGASTRODUODENOSCOPY (EGD) WITH PROPOFOL ;  Surgeon: Teressa Toribio SQUIBB, MD;  Location: [facility] ENDOSCOPY;  Service: Endoscopy;  Laterality: N/A;   EYE SURGERY  01/22/2020   HOT HEMOSTASIS N/A 12/24/2019   Procedure: HOT HEMOSTASIS (ARGON PLASMA COAGULATION/BICAP);  Surgeon: Teressa Toribio SQUIBB, MD;  Location: [facility] ENDOSCOPY;  Service: Endoscopy;  Laterality: N/A;   HOT HEMOSTASIS N/A 05/05/2024   Procedure: EGD, WITH ARGON PLASMA COAGULATION;  Surgeon: San Sandor GAILS, DO;  Location: [facility] ENDOSCOPY;  Service: Gastroenterology;  Laterality:  N/A;   POLYPECTOMY  12/24/2019   Procedure: POLYPECTOMY;  Surgeon: Teressa Toribio SQUIBB, MD;  Location: [facility] ENDOSCOPY;  Service: Endoscopy;;   POLYPECTOMY  05/05/2024   Procedure: POLYPECTOMY, INTESTINE;  Surgeon: San Sandor GAILS, DO;  Location: [facility] ENDOSCOPY;  Service: Gastroenterology;;   TRANSCAROTID ARTERY REVASCULARIZATION  Left 02/12/2024   Procedure: TRANSCAROTID ARTERY REVASCULARIZATION (TCAR);  Surgeon: Gretta Lonni PARAS, MD;  Location: [facility]  OR;  Service: Vascular;  Laterality: Left;   TUBAL LIGATION  1997   VIDEO BRONCHOSCOPY WITH ENDOBRONCHIAL ULTRASOUND  12/19/2023   Procedure: BRONCHOSCOPY, WITH EBUS;  Surgeon: Isadora Hose, MD;  Location: [facility] ENDOSCOPY;  Service: Pulmonary;;   VIDEO BRONCHOSCOPY WITH RADIAL ENDOBRONCHIAL ULTRASOUND  12/19/2023   Procedure: VIDEO BRONCHOSCOPY WITH RADIAL ENDOBRONCHIAL ULTRASOUND;  Surgeon: Isadora Hose, MD;  Location: [facility] ENDOSCOPY;  Service: Pulmonary;;    Family History  Problem Relation Age of Onset   Arthritis Mother    Cervical cancer Mother    Cancer Mother    Heart disease Father    Hypertension Father    Heart attack Father     Allergies[1]  Medications Ordered Prior to Encounter[2]  BP 128/66   Pulse 71   Temp 98.8 F (37.1 C) (Oral)   Ht 5' 3 (1.6 m)   Wt 159 lb 2 oz (72.2 kg)   SpO2 98% Comment: 2 L of O2  BMI 28.19 kg/m  Objective:   Physical Exam Cardiovascular:     Rate and Rhythm: Normal rate and regular rhythm.  Pulmonary:     Effort: Pulmonary effort is normal.     Breath sounds: Normal breath sounds.  Musculoskeletal:     Cervical back: Neck supple.  Skin:    General: Skin is warm and dry.  Neurological:     Mental Status: She is alert and oriented to person, place, and time.  Psychiatric:        Mood and Affect: Mood normal.     Physical Exam        Assessment & Plan:  Hyponatremia Assessment & Plan: Repeat BMP pending.  Will wean off Lexapro  given risks for hyponatremia with  SSRIs.  Orders: -     Basic metabolic panel with GFR  Influenza A with pneumonia Assessment & Plan: With recent hospitalization. Hospital notes, labs, imaging reviewed.  Will repeat chest x-ray today given her symptoms.  Await results  Orders: -     DG Chest 2 View  Essential hypertension Assessment & Plan: Controlled  Continue amlodipine  10 mg daily, metoprolol  tartrate 50 mg twice daily. Remain off of lisinopril  for now.  Continue furosemide  20 mg daily. BMP pending.  Orders: -     AMB Referral VBCI Care Management  Bilateral carotid artery stenosis -     AMB Referral VBCI Care Management  Coronary artery disease involving native coronary artery of native heart without angina pectoris -     AMB Referral VBCI Care Management  Atrial flutter with rapid ventricular response (HCC) -     AMB Referral VBCI Care Management  Chronic obstructive pulmonary disease, unspecified COPD type (HCC) -     AMB Referral VBCI Care Management  PAF (paroxysmal atrial fibrillation) (HCC) -     AMB Referral VBCI Care Management  SIADH (syndrome of inappropriate ADH production) -     AMB Referral VBCI Care Management  Multiple fractures of ribs, right side, initial encounter for closed fracture -     AMB Referral VBCI Care Management  Cardiomegaly -     AMB Referral VBCI Care Management  Acute respiratory failure with hypoxia [facility]) Assessment & Plan: With recent hospitalization. Hospital notes, labs, imaging reviewed.  We discussed weaning off of oxygen. She will also continue to monitor her oxygen saturation at home.  Will also refer to chronic care management   Anxiety and depression Assessment & Plan: Weaning off Lexapro  due to chronic hyponatremia  Continue BuSpar  10 mg  twice daily. Consider bupropion  XL 150 mg daily.  She will update.     Assessment and Plan Assessment & Plan         Comer MARLA Gaskins, NP       [1] No Known Allergies [2]   Current Outpatient Medications on File Prior to Visit  Medication Sig Dispense Refill   acetaminophen  (TYLENOL ) 500 MG tablet Take 2 tablets (1,000 mg total) by mouth every 8 (eight) hours as needed.     albuterol  (VENTOLIN  HFA) 108 (90 Base) MCG/ACT inhaler INHALE 2 PUFFS INTO THE LUNGS EVERY 4-6 HOURS AS NEEDED FOR TIGHTNESS/WHEEZING 8.5 each 0   amLODipine  (NORVASC ) 10 MG tablet Take 1 tablet (10 mg total) by mouth daily. 90 tablet 3   apixaban  (ELIQUIS ) 5 MG TABS tablet Take 1 tablet (5 mg total) by mouth 2 (two) times daily. 60 tablet 6   Ascorbic Acid  (VITAMIN C ) 1000 MG tablet Take 2,000 mg by mouth daily.     atorvastatin  (LIPITOR ) 80 MG tablet TAKE 1 TABLET (80 MG TOTAL) BY MOUTH DAILY FOR CHOLESTEROL 90 tablet 2   busPIRone  (BUSPAR ) 10 MG tablet TAKE 1 TABLET (10 MG TOTAL) BY MOUTH 2 (TWO) TIMES DAILY. FOR ANXIETY 180 tablet 2   clopidogrel  (PLAVIX ) 75 MG tablet Take 75 mg by mouth daily.     escitalopram  (LEXAPRO ) 20 MG tablet TAKE 1 TABLET BY MOUTH DAILY FOR ANXIETY 90 tablet 2   ferrous sulfate  325 (65 FE) MG tablet Take 1 tablet (325 mg total) by mouth 2 (two) times daily. For iron  180 tablet 1   fluticasone -salmeterol (WIXELA INHUB) 250-50 MCG/ACT AEPB Inhale 1 puff into the lungs in the morning and at bedtime. 60 each 12   furosemide  (LASIX ) 20 MG tablet Take 1 tablet (20 mg total) by mouth daily. 30 tablet 0   metoprolol  tartrate (LOPRESSOR ) 50 MG tablet Take 1 tablet (50 mg total) by mouth 2 (two) times daily. 180 tablet 3   oseltamivir  (TAMIFLU ) 75 MG capsule Take 1 capsule (75 mg total) by mouth 2 (two) times daily. 3 capsule 0   pantoprazole  (PROTONIX ) 40 MG tablet TAKE 1 TABLET (40 MG TOTAL) BY MOUTH DAILY. FOR HEARTBURN 90 tablet 2   predniSONE  (DELTASONE ) 10 MG tablet Take 4 tablets (40 mg total) by mouth daily for 3 days, THEN 3 tablets (30 mg total) daily for 3 days, THEN 2 tablets (20 mg total) daily for 3 days, THEN 1 tablet (10 mg total) daily for 3 days. 30 tablet 0    pyridoxine  (B-6) 100 MG tablet Take 100 mg by mouth daily.     sodium chloride  1 g tablet TAKE 1 TABLET (1 G TOTAL) BY MOUTH 2 (TWO) TIMES DAILY WITH A MEAL. TO INCREASE SODIUM 180 tablet 0   vitamin B-12 (CYANOCOBALAMIN ) 1000 MCG tablet Take 1,000 mcg by mouth daily.     vitamin E  180 MG (400 UNITS) capsule Take 400 Units by mouth daily.     No current facility-administered medications on file prior to visit.   "

## 2024-08-11 NOTE — Assessment & Plan Note (Signed)
 Weaning off Lexapro  due to chronic hyponatremia  Continue BuSpar  10 mg twice daily. Consider bupropion  XL 150 mg daily.  She will update.

## 2024-08-11 NOTE — Patient Instructions (Addendum)
 Wean off Lexapro . Take 1/2 tablet daily for 2 weeks then stop. Please update me as discussed.   You will receive a phone call regarding the chronic care manager.   Wean off your oxygen as discussed.   Stop by the lab and xray prior to leaving today. I will notify you of your results once received.   It was a pleasure to see you today!

## 2024-08-11 NOTE — Assessment & Plan Note (Signed)
 Controlled  Continue amlodipine  10 mg daily, metoprolol  tartrate 50 mg twice daily. Remain off of lisinopril  for now.  Continue furosemide  20 mg daily. BMP pending.

## 2024-08-11 NOTE — Assessment & Plan Note (Signed)
 Repeat BMP pending.  Will wean off Lexapro  given risks for hyponatremia with SSRIs.

## 2024-08-11 NOTE — Assessment & Plan Note (Addendum)
 With recent hospitalization. Hospital notes, labs, imaging reviewed.  We discussed weaning off of oxygen. She will also continue to monitor her oxygen saturation at home.  Will also refer to chronic care management

## 2024-08-11 NOTE — Assessment & Plan Note (Signed)
 With recent hospitalization. Hospital notes, labs, imaging reviewed.  Will repeat chest x-ray today given her symptoms.  Await results

## 2024-08-17 ENCOUNTER — Telehealth: Payer: Self-pay

## 2024-08-17 NOTE — Progress Notes (Signed)
 Complex Care Management Note Care Guide Note  08/17/2024 Name: Denise [name] MRN: [id number] DOB: 11-Oct-1960   Complex Care Management Outreach Attempts: An unsuccessful telephone outreach was attempted today to offer the patient information about available complex care management services.  Follow Up Plan:  Additional outreach attempts will be made to offer the patient complex care management information and services.   Encounter Outcome:  No Answer  Dreama [facility]  [facility], [facility] VBCI Assistant Direct Dial: [phone]  Fax: [phone]

## 2024-08-20 NOTE — Progress Notes (Signed)
 Complex Care Management Note Care Guide Note  08/20/2024 Name: Denise [name] MRN: [id number] DOB: Nov 02, 1960   Complex Care Management Outreach Attempts: A second unsuccessful outreach was attempted today to offer the patient with information about available complex care management services.  Follow Up Plan:  Additional outreach attempts will be made to offer the patient complex care management information and services.   Encounter Outcome:  No Answer  Dreama [facility]  [facility], [facility] VBCI Assistant Direct Dial: [phone]  Fax: [phone]

## 2024-08-21 ENCOUNTER — Telehealth: Payer: Self-pay | Admitting: Primary Care

## 2024-08-21 NOTE — Telephone Encounter (Signed)
 Copied from CRM [id number]. Topic: General - Other >> Aug 21, 2024  1:26 PM Mercedes MATSU wrote: Reason for CRM: Patient called in reqeusting a call back from NP [facility] or her nurse. She states she can be reached at [phone], she said she had a missed call.

## 2024-08-24 NOTE — Progress Notes (Signed)
 Complex Care Management Note Care Guide Note  08/24/2024 Name: Denise [name] MRN: [id number] DOB: [birth year]   Complex Care Management Outreach Attempts: A third unsuccessful outreach was attempted today to offer the patient with information about available complex care management services.  Follow Up Plan:  No further outreach attempts will be made at this time. We have been unable to contact the patient to offer or enroll patient in complex care management services.  Encounter Outcome:  No Answer  Dreama [facility]  [facility], [facility] VBCI Assistant Direct Dial: [phone]  Fax: [phone]

## 2024-08-24 NOTE — Telephone Encounter (Signed)
 Per chart, complex care management team is trying to contact patient and has not been able to reach her.

## 2024-08-25 ENCOUNTER — Telehealth: Payer: Self-pay

## 2024-08-25 DIAGNOSIS — F32A Depression, unspecified: Secondary | ICD-10-CM

## 2024-08-25 NOTE — Telephone Encounter (Signed)
 Copied from CRM [id number]. Topic: Clinical - Medication Question >> Aug 25, 2024  9:55 AM Burnard DEL wrote: Reason for CRM: Patient would like to know since she was taken off of the lexapro ,will she be able to be prescribed another anti depressant medication?

## 2024-08-26 ENCOUNTER — Other Ambulatory Visit

## 2024-08-26 DIAGNOSIS — E871 Hypo-osmolality and hyponatremia: Secondary | ICD-10-CM

## 2024-08-26 LAB — BASIC METABOLIC PANEL WITH GFR
BUN: 8 mg/dL (ref 6–23)
CO2: 30 meq/L (ref 19–32)
Calcium: 8.8 mg/dL (ref 8.4–10.5)
Chloride: 98 meq/L (ref 96–112)
Creatinine, Ser: 0.83 mg/dL (ref 0.40–1.20)
GFR: 75.23 mL/min
Glucose, Bld: 85 mg/dL (ref 70–99)
Potassium: 3.6 meq/L (ref 3.5–5.1)
Sodium: 134 meq/L — ABNORMAL LOW (ref 135–145)

## 2024-08-27 ENCOUNTER — Ambulatory Visit: Payer: Self-pay | Admitting: Primary Care

## 2024-08-27 NOTE — Telephone Encounter (Signed)
 Yes, of course. Does she feel worse since off Lexapro ? What symptoms?

## 2024-08-27 NOTE — Telephone Encounter (Signed)
 Please  clarify that she is taking buspirone  (Buspar ), not bupropion  (Wellbutrin ).  The buspirone  is supposed to help with anxiety, but she is on a lower dose. Does the buspirone  help at all for anxiety?We can increase the dose to 15 mg for which she can take 2 or three times daily.  If she does not want to try that then we can try mirtazapine at bedtime.

## 2024-08-27 NOTE — Telephone Encounter (Signed)
 Yesterday as last day of 0.5 tab of Lexapro . Started having trouble sleeping once she started tapering off Lexapro . Wants to know if there is something else she can take with Wellbutrin  to help her mind shut down when trying to sleep. Pls advise.

## 2024-08-28 MED ORDER — BUSPIRONE HCL 15 MG PO TABS: 15.0000 mg | ORAL_TABLET | Freq: Two times a day (BID) | ORAL | 0 refills | Status: AC

## 2024-08-28 NOTE — Telephone Encounter (Signed)
Noted, prescription sent to pharmacy. 

## 2024-08-28 NOTE — Addendum Note (Signed)
 Addended by: Jorrell Kuster K on: 08/28/2024 01:24 PM   Modules accepted: Orders

## 2024-08-28 NOTE — Telephone Encounter (Signed)
 Spoke with pt to clarify buspirone  or Wellbutrin . Pt confirms she is taking buspirone  and it helps a little with anxiety. I relayed [name]'s message about increasing strength. Pt agrees to try 15 mg tab.

## 2024-09-03 ENCOUNTER — Telehealth: Payer: Self-pay

## 2024-09-03 DIAGNOSIS — R600 Localized edema: Secondary | ICD-10-CM

## 2024-09-03 DIAGNOSIS — E871 Hypo-osmolality and hyponatremia: Secondary | ICD-10-CM

## 2024-09-03 MED ORDER — FUROSEMIDE 20 MG PO TABS: 20.0000 mg | ORAL_TABLET | Freq: Every day | ORAL | 1 refills | Status: AC

## 2024-09-03 NOTE — Telephone Encounter (Signed)
 Copied from CRM #[id number]. Topic: Clinical - Medication Question >> Sep 03, 2024 10:12 AM Carlyon D wrote: Reason for CRM: Pt is calling in regards to medication she was prescribed at the hospital  furosemide  (LASIX ) 20 MG tablet  Pt states she did not have a prescription from pcp miss Clark and is asking if a new script can be sent over as pt states she needs these pills and they helped very well. Pt saw miss clark 01/06 but it totally slipped her mind to menton it to pcp.  Please call pt if meds are sent over or can not be prescribed.   Preferred pharmacy: [facility] [address], [place] - [address] [place] [address] Phone: [phone] Fax: [phone] Hours: Not open 24 hours

## 2024-09-03 NOTE — Telephone Encounter (Signed)
 Yes, of course.   I will send a refill to her pharmacy now

## 2024-09-03 NOTE — Telephone Encounter (Signed)
 Called and spoke with patient. Relayed information and pt has no questions or concerns.

## 2024-09-04 ENCOUNTER — Telehealth: Payer: Self-pay | Admitting: Primary Care

## 2024-09-04 ENCOUNTER — Telehealth (HOSPITAL_BASED_OUTPATIENT_CLINIC_OR_DEPARTMENT_OTHER): Payer: Self-pay | Admitting: Cardiology

## 2024-09-04 NOTE — Telephone Encounter (Unsigned)
 Copied from CRM #[id number]. Topic: General - Other >> Sep 04, 2024 10:28 AM Pinkey ORN wrote: Reason for CRM: General Information

## 2024-09-04 NOTE — Telephone Encounter (Signed)
 Patient seen by Dr. Lonni prior to most recent hospitalization.  Cardiology was not asked to see her during that admission.

## 2024-09-09 ENCOUNTER — Telehealth: Payer: Self-pay

## 2024-09-09 NOTE — Telephone Encounter (Signed)
 Spoke with Marisol, of [employer], notifying her we haven't received any forms for pt. She confirmed our fax # [phone] and states she will relay message to Josette to make sure form is faxed today.   Fyi to [place].

## 2024-09-09 NOTE — Telephone Encounter (Signed)
 I have received NO forms for this patient.

## 2024-09-09 NOTE — Telephone Encounter (Signed)
 Copied from CRM #[id number]. Topic: General - Other >> Sep 04, 2024 10:50 AM Rozanna MATSU wrote: Reason for CRM: Kirsten with [employer] checking on information for pt to complete some forms they are working for pt >> Sep 09, 2024 11:30 AM [place] J wrote: Josette calling back due to hearing no response from her initial attempt reaching out. I let her know that at this time there are no updates for me to relay from the provider. She did state that this is an urgent matter and would like if someone could call her for an update since this information needs to be sent in before the 11th and it has been 2 weeks since she has sent the form out for the provider to fill out.  [phone] Ext. [phone] Ona will be out of office until the 11th but will have someone to go over voicemails)

## 2024-09-10 ENCOUNTER — Other Ambulatory Visit: Payer: Self-pay | Admitting: Vascular Surgery

## 2024-09-10 NOTE — Telephone Encounter (Signed)
 Noted, can we check to see if anything came in?

## 2024-09-10 NOTE — Telephone Encounter (Signed)
 Checked this AM, nothing yet.

## 2024-10-05 ENCOUNTER — Other Ambulatory Visit

## 2024-10-06 ENCOUNTER — Ambulatory Visit (HOSPITAL_BASED_OUTPATIENT_CLINIC_OR_DEPARTMENT_OTHER): Admitting: Cardiology

## 2024-10-13 ENCOUNTER — Telehealth: Admitting: Diagnostic Neuroimaging

## 2024-12-29 ENCOUNTER — Ambulatory Visit

## 2024-12-29 ENCOUNTER — Ambulatory Visit (HOSPITAL_COMMUNITY)
